# Patient Record
Sex: Female | Born: 1970 | Race: White | Hispanic: No | Marital: Single | Smoking: Former smoker
Health system: Southern US, Community
[De-identification: ages and names within clinical notes are randomized; demographics above are authoritative.]

## PROBLEM LIST (undated history)

## (undated) DIAGNOSIS — I314 Cardiac tamponade: Secondary | ICD-10-CM

## (undated) DIAGNOSIS — R51 Headache: Secondary | ICD-10-CM

## (undated) DIAGNOSIS — K8020 Calculus of gallbladder without cholecystitis without obstruction: Secondary | ICD-10-CM

## (undated) DIAGNOSIS — I3139 Other pericardial effusion (noninflammatory): Secondary | ICD-10-CM

## (undated) DIAGNOSIS — K760 Fatty (change of) liver, not elsewhere classified: Secondary | ICD-10-CM

## (undated) DIAGNOSIS — G43109 Migraine with aura, not intractable, without status migrainosus: Secondary | ICD-10-CM

## (undated) DIAGNOSIS — F411 Generalized anxiety disorder: Secondary | ICD-10-CM

## (undated) DIAGNOSIS — G8929 Other chronic pain: Secondary | ICD-10-CM

## (undated) DIAGNOSIS — R112 Nausea with vomiting, unspecified: Secondary | ICD-10-CM

## (undated) DIAGNOSIS — I4901 Ventricular fibrillation: Secondary | ICD-10-CM

## (undated) DIAGNOSIS — Z8619 Personal history of other infectious and parasitic diseases: Secondary | ICD-10-CM

## (undated) DIAGNOSIS — F329 Major depressive disorder, single episode, unspecified: Secondary | ICD-10-CM

## (undated) DIAGNOSIS — R0789 Other chest pain: Secondary | ICD-10-CM

## (undated) DIAGNOSIS — I5042 Chronic combined systolic (congestive) and diastolic (congestive) heart failure: Secondary | ICD-10-CM

## (undated) DIAGNOSIS — I4892 Unspecified atrial flutter: Secondary | ICD-10-CM

## (undated) DIAGNOSIS — R519 Headache, unspecified: Secondary | ICD-10-CM

## (undated) DIAGNOSIS — I959 Hypotension, unspecified: Secondary | ICD-10-CM

## (undated) DIAGNOSIS — D649 Anemia, unspecified: Secondary | ICD-10-CM

## (undated) DIAGNOSIS — Z9289 Personal history of other medical treatment: Secondary | ICD-10-CM

## (undated) DIAGNOSIS — G709 Myoneural disorder, unspecified: Secondary | ICD-10-CM

## (undated) DIAGNOSIS — I4891 Unspecified atrial fibrillation: Secondary | ICD-10-CM

## (undated) DIAGNOSIS — I422 Other hypertrophic cardiomyopathy: Secondary | ICD-10-CM

## (undated) DIAGNOSIS — Z9889 Other specified postprocedural states: Secondary | ICD-10-CM

## (undated) DIAGNOSIS — Z5189 Encounter for other specified aftercare: Secondary | ICD-10-CM

## (undated) DIAGNOSIS — R55 Syncope and collapse: Secondary | ICD-10-CM

## (undated) DIAGNOSIS — E039 Hypothyroidism, unspecified: Secondary | ICD-10-CM

## (undated) DIAGNOSIS — I82409 Acute embolism and thrombosis of unspecified deep veins of unspecified lower extremity: Secondary | ICD-10-CM

## (undated) DIAGNOSIS — M549 Dorsalgia, unspecified: Secondary | ICD-10-CM

## (undated) DIAGNOSIS — M797 Fibromyalgia: Secondary | ICD-10-CM

## (undated) DIAGNOSIS — I313 Pericardial effusion (noninflammatory): Secondary | ICD-10-CM

## (undated) DIAGNOSIS — I96 Gangrene, not elsewhere classified: Secondary | ICD-10-CM

## (undated) DIAGNOSIS — F4310 Post-traumatic stress disorder, unspecified: Secondary | ICD-10-CM

## (undated) DIAGNOSIS — F32A Depression, unspecified: Secondary | ICD-10-CM

## (undated) HISTORY — DX: Other specified postprocedural states: Z98.890

## (undated) HISTORY — DX: Other chronic pain: G89.29

## (undated) HISTORY — PX: INSERT / REPLACE / REMOVE PACEMAKER: SUR710

## (undated) HISTORY — DX: Personal history of other infectious and parasitic diseases: Z86.19

## (undated) HISTORY — PX: INDUCED ABORTION: SHX677

## (undated) HISTORY — DX: Dorsalgia, unspecified: M54.9

## (undated) HISTORY — DX: Migraine with aura, not intractable, without status migrainosus: G43.109

## (undated) HISTORY — DX: Headache: R51

## (undated) HISTORY — DX: Myoneural disorder, unspecified: G70.9

## (undated) HISTORY — DX: Anemia, unspecified: D64.9

## (undated) HISTORY — DX: Fatty (change of) liver, not elsewhere classified: K76.0

## (undated) HISTORY — DX: Generalized anxiety disorder: F41.1

## (undated) HISTORY — PX: MITRAL VALVE REPAIR: SHX2039

## (undated) HISTORY — PX: MYOMECTOMY: SHX85

## (undated) HISTORY — DX: Personal history of other medical treatment: Z92.89

## (undated) HISTORY — DX: Encounter for other specified aftercare: Z51.89

## (undated) HISTORY — DX: Headache, unspecified: R51.9

## (undated) HISTORY — DX: Other chest pain: R07.89

---

## 2006-07-08 ENCOUNTER — Emergency Department (HOSPITAL_COMMUNITY): Admission: EM | Admit: 2006-07-08 | Discharge: 2006-07-08 | Payer: Self-pay | Admitting: Family Medicine

## 2008-04-20 ENCOUNTER — Ambulatory Visit: Payer: Self-pay

## 2008-08-15 ENCOUNTER — Ambulatory Visit: Payer: Self-pay | Admitting: Cardiovascular Disease

## 2009-03-09 DIAGNOSIS — F418 Other specified anxiety disorders: Secondary | ICD-10-CM

## 2009-03-09 DIAGNOSIS — I429 Cardiomyopathy, unspecified: Secondary | ICD-10-CM | POA: Insufficient documentation

## 2010-07-17 ENCOUNTER — Emergency Department: Payer: Self-pay | Admitting: Emergency Medicine

## 2010-10-09 ENCOUNTER — Encounter: Payer: Self-pay | Admitting: Cardiovascular Disease

## 2010-10-15 ENCOUNTER — Ambulatory Visit: Admission: RE | Admit: 2010-10-15 | Discharge: 2010-10-15 | Payer: Self-pay | Source: Home / Self Care

## 2010-10-15 ENCOUNTER — Ambulatory Visit (HOSPITAL_COMMUNITY)
Admission: RE | Admit: 2010-10-15 | Discharge: 2010-10-15 | Payer: Self-pay | Source: Home / Self Care | Attending: Cardiovascular Disease | Admitting: Cardiovascular Disease

## 2010-10-15 ENCOUNTER — Emergency Department: Payer: Self-pay | Admitting: Unknown Physician Specialty

## 2010-10-15 ENCOUNTER — Encounter: Payer: Self-pay | Admitting: Cardiovascular Disease

## 2010-10-18 ENCOUNTER — Ambulatory Visit: Admit: 2010-10-18 | Payer: Self-pay | Admitting: Cardiovascular Disease

## 2010-10-22 ENCOUNTER — Telehealth (INDEPENDENT_AMBULATORY_CARE_PROVIDER_SITE_OTHER): Payer: Self-pay | Admitting: *Deleted

## 2010-11-01 NOTE — Progress Notes (Signed)
Summary: Faxerd records to Dr. Cyndie Mull at [facility].   Faxerd records to Dr. Cyndie Mull at [facility]. ECHO Fax: [phone] Phone:[phone] ext. [phone] Marylou Mccoy  October 22, 2010 10:35 AM

## 2010-11-01 NOTE — Letter (Signed)
Summary: Medical Record Release  Medical Record Release   Imported By: Harlon Flor 10/09/2010 13:50:19  _____________________________________________________________________  External Attachment:    Type:   Image     Comment:   External Document

## 2011-02-12 NOTE — Assessment & Plan Note (Signed)
[facility] OFFICE NOTE   [name], [name]                         MRN:          [id number]  DATE:08/15/2008                            DOB:          08/31/1971    REASON FOR VISIT:  Hypertrophic cardiomyopathy.   HISTORY OF PRESENT ILLNESS:  [name] is a 41 year old woman who moved  here from [place] approximately 1-1/2 years ago.  She has previously  seen a cardiologist in [place] and it appears that she carries a  diagnosis of hypertrophic cardiomyopathy.  I do not have any records,  but her description sounds consistent with that diagnosis.  She has been  told she has a thick heart muscle.  She is treated with beta-blocker  and calcium channel blocker, and is actually doing well from a  symptomatic standpoint.  She specifically denies chest pain, dyspnea,  lightheadedness, or syncope.  She was first diagnosed with a heart  murmur when she was 40 years old, but was not diagnosed with  hypertrophic cardiomyopathy until her first pregnancy.  She has not seen  a primary care physician or other doctors since she has lived in [place].  Her past evaluation has included an echocardiogram.  I do not  think she is having the other testing done.  She denies palpitations,  edema, orthopnea, PND, or other cardiovascular complaints.   PAST MEDICAL HISTORY:  1. C-section, 1991.  2. Anxiety.  3. Seasonal allergies.   She has no history of hospitalizations or other chronic medical  problems.   FAMILY HISTORY:  There is no history of hypertrophic cardiomyopathy or  sudden death in the family.  Her parents are alive.  Her mother is age  42 and has thyroid disease, her father is 74 years old and has diabetes  and some unspecified heart problem.  As above, there is no sudden death  in the family.   SOCIAL HISTORY:  The patient is single.  She has an 89 year old daughter  who is a Consulting civil engineer at [employer].  Her daughter has been told she  has a benign murmur, but does not carry the diagnosis of hypertrophic  cardiomyopathy.  The patient does not smoke cigarettes.  She is a former  smoker that quit in 1990.  She drinks 2-3 alcoholic beverages per week  at the most.  She walks 20-30 minutes daily.  She is not engaged in any  heavy physical exercise.   REVIEW OF SYSTEMS:  Pertinent positives include asthma, anemia, anxiety,  and exertional dyspnea with moderate level activity.  A complete 12-  point review of systems was performed and is otherwise negative.   MEDICATIONS:  1. Toprol-XL 100 mg b.i.d.  2. Cartia XT 120 mg daily.  3. Zyrtec one daily.  4. Advair 250/50 mcg daily.   ALLERGIES:  NKDA.   PHYSICAL EXAMINATION:  GENERAL:  The patient is alert and oriented.  She  is in no acute distress.  VITAL SIGNS:  Weight is 184 pounds, blood pressure is 80/60, heart rate  69, respiratory rate  12.  HEENT:  Normal.  NECK:  Normal carotid upstrokes with bilateral transmitted bruits.  JVP  is normal.  No thyromegaly or thyroid nodules.  LUNGS:  Clear bilaterally.  HEART:  The apex is discrete and nondisplaced.  Regular rate and rhythm.  There is a 3/6 midsystolic murmur best heard along the left sternal  border.  There is a widely split S2 with normal respiratory changes.  ABDOMEN:  Soft, obese, nontender.  No organomegaly.  EXTREMITIES:  No clubbing, cyanosis, or edema.  Peripheral pulses are  intact and equal.  SKIN:  Warm and dry without rash.  NEUROLOGIC:  Cranial nerves II through XII intact.  Strength is intact  and equal bilaterally.   EKG shows normal sinus rhythm with marked LVH and marked anterolateral  ST and T-wave changes consistent with a diagnosis of hypertrophic  cardiomyopathy.   There is also a left axis deviation with left anterior hemiblock.   ASSESSMENT:  This is a 40 year old woman with physical exam and  electrocardiographic signs of hypertrophic  cardiomyopathy.  She does not  appear to have high-risk features in an absence of family history and  symptoms.  Her exertional dyspnea may be related to her heart disease,  but also may be from deconditioning and asthma.  I think she clearly  should have an echocardiogram to make certain of the diagnosis and  assess the thickness of the interventricular septum.  We will follow up  with her after her echo is completed.  She was given refills for her  cardiac medications today.  She was advised to avoid strenuous activity.     Veverly Fells. Excell Seltzer, MD  Electronically Signed    MDC/MedQ  DD: 08/15/2008  DT: 08/16/2008  Job #: [id number]

## 2011-03-18 ENCOUNTER — Encounter: Payer: Self-pay | Admitting: Cardiovascular Disease

## 2011-07-18 ENCOUNTER — Emergency Department (HOSPITAL_COMMUNITY)
Admission: EM | Admit: 2011-07-18 | Discharge: 2011-07-18 | Disposition: A | Discharge status: Home / Self Care | Payer: 59 | Attending: Emergency Medicine | Admitting: Emergency Medicine

## 2011-07-18 DIAGNOSIS — F410 Panic disorder [episodic paroxysmal anxiety] without agoraphobia: Secondary | ICD-10-CM | POA: Insufficient documentation

## 2011-07-18 DIAGNOSIS — R011 Cardiac murmur, unspecified: Secondary | ICD-10-CM | POA: Insufficient documentation

## 2011-07-18 DIAGNOSIS — R52 Pain, unspecified: Secondary | ICD-10-CM | POA: Insufficient documentation

## 2011-07-18 DIAGNOSIS — Z79899 Other long term (current) drug therapy: Secondary | ICD-10-CM | POA: Insufficient documentation

## 2011-07-18 DIAGNOSIS — F411 Generalized anxiety disorder: Secondary | ICD-10-CM | POA: Insufficient documentation

## 2011-08-13 ENCOUNTER — Telehealth: Payer: Self-pay | Admitting: Cardiovascular Disease

## 2011-08-13 NOTE — Telephone Encounter (Signed)
I spoke with the pt and she has a history of hypertrophic Cardiomyopathy.  The pt has not seen Dr Excell Seltzer since 2009.  The pt did have an ECHO in January 2012 with a normal EF. I made the pt aware that Dr Excell Seltzer is out of the office this week.  I offered her an appt with the PA this week but she has to work.  She would like to see the PA next week (08/20/11) and I will leave the appt with Dr Excell Seltzer on 08/30/11.

## 2011-08-13 NOTE — Telephone Encounter (Signed)
Pt having SOB thought it was due to  upper respiratory infection but that's getting better and SOB still the same, PCP did xray today and it shoed enlarged heart, she has appt 11-30 andd wants a sooner appt with Dr Excell Seltzer

## 2011-08-14 ENCOUNTER — Inpatient Hospital Stay (HOSPITAL_COMMUNITY)
Admission: EM | Admit: 2011-08-14 | Discharge: 2011-08-15 | DRG: 316 | Disposition: A | Discharge status: Home / Self Care | Payer: 59 | Source: Ambulatory Visit | Attending: Internal Medicine | Admitting: Internal Medicine

## 2011-08-14 ENCOUNTER — Encounter (HOSPITAL_COMMUNITY): Payer: Self-pay | Admitting: *Deleted

## 2011-08-14 ENCOUNTER — Emergency Department (HOSPITAL_COMMUNITY): Payer: 59

## 2011-08-14 DIAGNOSIS — R197 Diarrhea, unspecified: Secondary | ICD-10-CM

## 2011-08-14 DIAGNOSIS — R112 Nausea with vomiting, unspecified: Secondary | ICD-10-CM

## 2011-08-14 DIAGNOSIS — G8929 Other chronic pain: Secondary | ICD-10-CM | POA: Diagnosis present

## 2011-08-14 DIAGNOSIS — I429 Cardiomyopathy, unspecified: Secondary | ICD-10-CM | POA: Diagnosis present

## 2011-08-14 DIAGNOSIS — K5289 Other specified noninfective gastroenteritis and colitis: Secondary | ICD-10-CM | POA: Diagnosis present

## 2011-08-14 DIAGNOSIS — F411 Generalized anxiety disorder: Secondary | ICD-10-CM | POA: Diagnosis present

## 2011-08-14 DIAGNOSIS — J45909 Unspecified asthma, uncomplicated: Secondary | ICD-10-CM | POA: Diagnosis present

## 2011-08-14 DIAGNOSIS — M549 Dorsalgia, unspecified: Secondary | ICD-10-CM | POA: Diagnosis present

## 2011-08-14 DIAGNOSIS — Z87891 Personal history of nicotine dependence: Secondary | ICD-10-CM

## 2011-08-14 DIAGNOSIS — R0602 Shortness of breath: Secondary | ICD-10-CM

## 2011-08-14 DIAGNOSIS — E860 Dehydration: Secondary | ICD-10-CM

## 2011-08-14 DIAGNOSIS — F418 Other specified anxiety disorders: Secondary | ICD-10-CM | POA: Diagnosis present

## 2011-08-14 DIAGNOSIS — I319 Disease of pericardium, unspecified: Secondary | ICD-10-CM

## 2011-08-14 DIAGNOSIS — I421 Obstructive hypertrophic cardiomyopathy: Principal | ICD-10-CM | POA: Diagnosis present

## 2011-08-14 LAB — COMPREHENSIVE METABOLIC PANEL
Albumin: 3.8 g/dL (ref 3.5–5.2)
Alkaline Phosphatase: 71 U/L (ref 39–117)
BUN: 9 mg/dL (ref 6–23)
Chloride: 103 mEq/L (ref 96–112)
Potassium: 3.8 mEq/L (ref 3.5–5.1)
Total Bilirubin: 1.1 mg/dL (ref 0.3–1.2)

## 2011-08-14 LAB — CBC
MCH: 28.9 pg (ref 26.0–34.0)
MCV: 87.4 fL (ref 78.0–100.0)
WBC: 6.6 10*3/uL (ref 4.0–10.5)

## 2011-08-14 LAB — TSH: TSH: 3.15 u[IU]/mL (ref 0.350–4.500)

## 2011-08-14 MED ORDER — ENOXAPARIN SODIUM 40 MG/0.4ML ~~LOC~~ SOLN
40.0000 mg | SUBCUTANEOUS | Status: DC
  Administered 2011-08-14: 40 mg via SUBCUTANEOUS
  Filled 2011-08-14 (×2): qty 0.4

## 2011-08-14 MED ORDER — ACETAMINOPHEN 325 MG PO TABS: 650.0000 mg | ORAL_TABLET | Freq: Four times a day (QID) | ORAL | Status: DC | PRN

## 2011-08-14 MED ORDER — DILTIAZEM HCL ER COATED BEADS 120 MG PO CP24
120.0000 mg | ORAL_CAPSULE | Freq: Every day | ORAL | Status: DC
  Filled 2011-08-14: qty 1

## 2011-08-14 MED ORDER — ACETAMINOPHEN 650 MG RE SUPP: 650.0000 mg | Freq: Four times a day (QID) | RECTAL | Status: DC | PRN

## 2011-08-14 MED ORDER — ONDANSETRON HCL 4 MG PO TABS: 4.0000 mg | ORAL_TABLET | Freq: Four times a day (QID) | ORAL | Status: DC | PRN

## 2011-08-14 MED ORDER — METOPROLOL SUCCINATE ER 100 MG PO TB24
100.0000 mg | ORAL_TABLET | Freq: Every day | ORAL | Status: DC
  Filled 2011-08-14: qty 1

## 2011-08-14 MED ORDER — FLUTICASONE PROPIONATE HFA 44 MCG/ACT IN AERO
1.0000 | INHALATION_SPRAY | Freq: Two times a day (BID) | RESPIRATORY_TRACT | Status: DC
  Administered 2011-08-14 – 2011-08-15 (×2): 1 via RESPIRATORY_TRACT
  Filled 2011-08-14: qty 10.6

## 2011-08-14 MED ORDER — ONDANSETRON HCL 4 MG/2ML IJ SOLN
4.0000 mg | Freq: Once | INTRAMUSCULAR | Status: AC
  Administered 2011-08-14: 4 mg via INTRAVENOUS

## 2011-08-14 MED ORDER — SODIUM CHLORIDE 0.9 % IV SOLN
Freq: Once | INTRAVENOUS | Status: AC
  Administered 2011-08-14: 12:00:00 via INTRAVENOUS

## 2011-08-14 MED ORDER — ONDANSETRON HCL 4 MG/2ML IJ SOLN
INTRAMUSCULAR | Status: AC
  Filled 2011-08-14: qty 2

## 2011-08-14 MED ORDER — SODIUM CHLORIDE 0.9 % IV SOLN
INTRAVENOUS | Status: DC
  Administered 2011-08-14: 12:00:00 via INTRAVENOUS

## 2011-08-14 MED ORDER — SODIUM CHLORIDE 0.9 % IV BOLUS (SEPSIS)
500.0000 mL | Freq: Once | INTRAVENOUS | Status: AC
  Administered 2011-08-14: 500 mL via INTRAVENOUS

## 2011-08-14 MED ORDER — NORETHINDRONE 0.35 MG PO TABS
1.0000 | ORAL_TABLET | Freq: Every day | ORAL | Status: DC
  Administered 2011-08-15: 0.35 mg via ORAL

## 2011-08-14 MED ORDER — LORATADINE 10 MG PO TABS
10.0000 mg | ORAL_TABLET | Freq: Every day | ORAL | Status: DC
  Administered 2011-08-15: 10 mg via ORAL
  Filled 2011-08-14: qty 1

## 2011-08-14 MED ORDER — SODIUM CHLORIDE 0.9 % IV SOLN
INTRAVENOUS | Status: AC
  Administered 2011-08-14: 23:00:00 via INTRAVENOUS

## 2011-08-14 MED ORDER — AMITRIPTYLINE HCL 25 MG PO TABS
25.0000 mg | ORAL_TABLET | Freq: Every day | ORAL | Status: DC
  Filled 2011-08-14 (×2): qty 1

## 2011-08-14 MED ORDER — ONDANSETRON HCL 4 MG/2ML IJ SOLN: 4.0000 mg | Freq: Four times a day (QID) | INTRAMUSCULAR | Status: DC | PRN

## 2011-08-14 NOTE — H&P (Signed)
Hospital Admission Note Date: 08/14/2011  Patient name: [name] Medical record number: [id number] Date of birth: 03/03/1971 Age: 40 y.o. Gender: female PCP: No primary provider on file.  Medical Service: Int Medicine  Attending physician:  Dr. Meredith Pel   Pager: Resident (R2/R3):  Dr. Tonny Branch   Pager: [phone] Resident (R1):  Dr. Berlinda Last   Pager: [phone]  Chief Complaint: Nausea/Vomiting/Diarrhea  History of Present Illness: Patient is a 40 year old woman with a history of obstructive HCM, asthma, and anxiety who presents with an acute history of nausea, vomiting, and diarrhea. Patient says that she had a sinus infection 10 days ago and was seen by urgent care, and was prescribed Augmentin. A few days later, the patient started developing watery diarrhea roughly 2-4 times daily. Then, roughly 2 days ago, the patient began vomiting as many as 5 times per day. The patient says that her boyfriend had a similar diarrheal illness a few days ago, but his resolved without intervention. She denies any hematemesis, melena, or bright red blood per rectum. She also denies any abdominal pain, or chest pain. No fevers or chills. She admits to a dry cough that has persisted throughout the duration of this illness, it is not getting better.  In addition, the patient complains of 6 months of progressive worsening dyspnea on exertion. She does carry the diagnosis of hypertrophic cardiomyopathy, and was last seen by Dr. Excell Seltzer in 2009. She had an echo in January of 2012 that showed an ejection fraction of 60-65%, very small outflow tract, and severe LVH. Her Doppler parameters were consistent with restrictive physiology. The patient describes a decrease in her exercise tolerance, down from a quarter mile to less than a block. She also endorses orthopnea, now requiring 5 pillows, up from 2. She denies PND, but will wake up in the night coughing. She also admits to occasional dizziness, but denies any palpitations,  chest pain, or weight change.  Past Medical History: Past Medical History  Diagnosis Date  . Secondary cardiomyopathy, unspecified   . Anxiety state, unspecified   . Seasonal allergies   . Asthma    Past Surgical History  Procedure Date  . Cesarean section 1991  . Induced abortion 1990 and 1993    Meds: Current Facility-Administered Medications  Medication Dose Route Frequency Provider Last Rate Last Dose  . 0.9 %  sodium chloride infusion   Intravenous Once Shaaron Adler, [place] 100 mL/hr at 08/14/11 1210    . 0.9 %  sodium chloride infusion   Intravenous Continuous Jessica Hope 100 mL/hr at 08/14/11 1200    . ondansetron (ZOFRAN) 4 MG/2ML injection           . ondansetron (ZOFRAN) injection 4 mg  4 mg Intravenous Once Shaaron Adler, PA   4 mg at 08/14/11 0730  . sodium chloride 0.9 % bolus 500 mL  500 mL Intravenous Once Shaaron Adler, PA   500 mL at 08/14/11 0715   Current Outpatient Prescriptions  Medication Sig Dispense Refill  . beclomethasone (QVAR) 80 MCG/ACT inhaler Inhale 1 puff into the lungs as needed. For breathing      . cetirizine (ZYRTEC) 10 MG tablet Take 10 mg by mouth daily.        . metoprolol (TOPROL-XL) 100 MG 24 hr tablet Take 100 mg by mouth daily.        . norethindrone (MICRONOR,CAMILA,ERRIN) 0.35 MG tablet Take 1 tablet by mouth daily.        Marland Kitchen diltiazem (CARDIZEM CD)  120 MG 24 hr capsule Take 120 mg by mouth daily.          Allergies: Review of patient's allergies indicates no known allergies.  Family Hx: No history of HOCM  Social Hx: History   Social History  . Marital Status: Single    Spouse Name: N/A    Number of Children: 1  . Years of Education: N/A   Occupational History  . Naval architect    Social History Main Topics  . Smoking status: Former Smoker -- 1.0 packs/day for 3 years    Types: Cigarettes    Quit date: 09/30/1988  . Smokeless tobacco: Not on file   Comment: quitin 1990  . Alcohol  Use: 0.0 oz/week     rarely drinks wine  . Drug Use: No     quiti n 1989  . Sexually Active: Not on file   Other Topics Concern  . 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  PCP Dr. Sharen Hones.  Cardiologist Dr. Excell Seltzer.  Last saw him in 2009.    Review of Systems: --Patient also complains of right lateral leg numbness with prolonged sitting or standing  Physical Exam: Filed Vitals:   08/14/11 1018 08/14/11 1130 08/14/11 1228 08/14/11 1335  BP: 82/51 77/50 85/50  83/55  Pulse: 68 62 74 74  Temp:      TempSrc:      Resp:    16  SpO2: 99% 99% 97% 98%   GEN: No apparent distress.  Alert and oriented x 3.  Pleasant, conversant, and cooperative to exam. HEENT: head is autraumatic and normocephalic.  Neck is supple without palpable masses or lymphadenopathy. EOMI.  PERRLA.  Sclerae anicteric.  Conjunctivae without pallor or injection. Mucous membranes are moist.  Oropharynx is without erythema, exudates, or other abnormal lesions. RESP:  Lungs are clear to ascultation bilaterally with good air movement.  No wheezes, ronchi, or rubs. CARDIOVASCULAR: regular rate, normal rhythm.  Clear S1, S2,3/6 HSM @ LSB.  Worse c valsalva ABDOMEN: soft, non-tender, non-distended.  Bowels sounds present in all quadrants and normoactive.  No palpable masses. EXT: warm and dry.  Peripheral pulses equal, intact, and +2 globally.  No clubbing or cyanosis.  No edema in b/l lower extremeties SKIN: warm and dry with normal turgor.  No rashes or abnormal lesions observed. NEURO: CN II-XII grossly intact.  Muscle strength +5/5 in bilateral upper and lower extremities.  Sensation is grossly intact.  No focal deficit.  Lab results: Basic Metabolic Panel:  Basename 08/14/11 0730  NA 136  K 3.8  CL 103  CO2 25  GLUCOSE 88  BUN 9  CREATININE 0.59  CALCIUM 9.2  MG --  PHOS --   Liver Function Tests:  Basename 08/14/11 0730  AST 36  ALT 40*  ALKPHOS 71    BILITOT 1.1  PROT 8.0  ALBUMIN 3.8   CBC:  Basename 08/14/11 0730  WBC 6.6  NEUTROABS --  HGB 13.6  HCT 41.1  MCV 87.4  PLT 211   BNP:  Basename 08/14/11 0730  POCBNP 2451.0*    Imaging results:  Dg Chest 2 View  08/14/2011  *RADIOLOGY REPORT*  Clinical Data: Cough, shortness of breath  CHEST - 2 VIEW  Comparison: None.  Findings: Cardiomegaly is noted.  Central mild vascular congestion without convincing pulmonary edema.  No segmental infiltrate.  Mild elevation of the right hemidiaphragm.  Mild degenerative changes thoracic spine.  IMPRESSION: Cardiomegaly.  No segmental infiltrate or convincing pulmonary edema.  Mild elevation of the right hemidiaphragm.  Original Report Authenticated By: Natasha Mead, M.D.    Other results: EKG:NSR @ 67 with e/o LVH, c/w prior  Assessment & Plan by Problem: #Nausea, vomiting, diarrhea Patient's presentation consistent with an acute gastroenteritis. Time course of diarrhea followed later by vomiting may not be classic, but the acute nature of the events as well as her known sick contact are suspicious for something infectious in origin. Most likely viral, but in the setting of recent Augmentin use, could also be bacterial. In addition, the Augmentin itself can cause diarrhea that is noninfectious in nature. We will test for C. difficile and give her symptomatic treatment with antiemetics and fluid resuscitation. In the setting of her known HOCM, we will have to be gentle with the fluids. The patient's baseline blood pressure is in the 90s (likely secondary to her small amount of outflow tract obstruction), and she came in in the 70s. There was some concern regarding her hypotension, but the patient appears quite well and not acutely ill. Constantly, we'll admit to a telemetry bed and continue with gentle fluids. - c diff pcr - gentle fluids - zofran - telemetry bed - TSH  #HOCM Patient presents a picture of progressive worsening of heart  failure type symptoms over a six-month period of time. She last saw Dr. Excell Seltzer in 2009, and had an echo in January of 2012 but did not followup with any provider. Her course is certainly concerning for worsening obstruction, and an echo has been ordered. At this time she does not appear to be in acute failure, with clear lungs, no peripheral edema, and no pulmonary edema on chest x-ray. Her BNP is elevated, but that may be chronic for her (at least over the past few months), or it could be in relation to her decreased preload secondary to volume depletion causing strain on her heart.  Cardiology saw the patient in the emergency room and agreed to consult. It is likely that her acute GI upset will resolve with minimal intervention, but this HOCM will be an ongoing issue for her. - Continue home metoprolol 100mg  XL and diltiazem 120mg  daily - Followup echo - Appreciate cardiology input - Daily ins and outs - Blood cultures to rule out SBE   #Shortness of breath Patient does have cough and worsening shortness of breath over the past few months. This is likely a combination of her HOCM, her asthma, and her anxiety. We will be treating all of these issues and currently, and working up the current status of her HOCM. - see above for HOCM - loratadine - amitriptyline at bedtime - fluticasone - albuterol PRN  #Prophylaxis - lovenox

## 2011-08-14 NOTE — ED Notes (Signed)
MD at bedside, Dr. Ignacia Palma

## 2011-08-14 NOTE — ED Provider Notes (Signed)
History     CSN: [id number] Arrival date & time: 08/14/2011  5:51 AM   First MD Initiated Contact with Patient 08/14/11 [phone]      Chief Complaint  Patient presents with  . Emesis    (Consider location/radiation/quality/duration/timing/severity/associated sxs/prior treatment) Patient is a 40 y.o. female presenting with vomiting. The history is provided by the patient. No language interpreter was used.  Emesis  Associated symptoms include cough and diarrhea. Pertinent negatives include no abdominal pain, no chills, no fever and no headaches.  Pt presents with 2 days of nausea, vomiting, and diarrhea. Reports unable to keep down any solids or liquids. 5-10 episodes daily. Unchanged since onset. Vomit with undigested food/bile- pt denies hematemesis. Diarrhea alternating between soft and watery, no hematochezia. Pt reports associated lightheadedness when standing. Denies fever. She has been on Augmentin since last Sunday when she went to a [facility] for evaluation of cough and was dx with a sinus infection.  Pt also c/o a non-productive cough for approx 10 days. She was seen at an urgent care at time of onset and was given abx for a sinus infection. Has had increased SOB associated with the cough. Admits to mild leg swelling after working on her feet for long shifts but states it resolves by morning. Reports orthopnea that requires sleeping in a sitting position most nights. Worse with exertion. Denies fever, CP, diaphoresis. Has tried albuterol for the cough without relief. Presented to her PMD yesterday with continued c/o cough; a CXR was performed and she was told to follow up with Dr Excell Seltzer for an enlarged heart but that there was no pneumonia on her CXR. Pt reports a hx of murmur but denies any knowledge of an enlarged heart in the past. Pt does report a hx of asthma.  Past Medical History  Diagnosis Date  . Secondary cardiomyopathy, unspecified   . Anxiety state, unspecified   .  Seasonal allergies   . Asthma   . Enlarged heart     Past Surgical History  Procedure Date  . Cesarean section 1991  . Induced abortion 1990 and 1993    History reviewed. No pertinent family history.  History  Substance Use Topics  . Smoking status: Former Games developer  . Smokeless tobacco: Not on file   Comment: quitin 1990  . Alcohol Use: Yes     Review of Systems  Constitutional: Negative for fever, chills and diaphoresis.  HENT: Positive for congestion, postnasal drip and sinus pressure. Negative for ear pain, sore throat, neck pain and neck stiffness.   Eyes: Negative for pain and visual disturbance.  Respiratory: Positive for cough and shortness of breath. Negative for chest tightness and wheezing.   Cardiovascular: Negative for chest pain, palpitations and leg swelling.  Gastrointestinal: Positive for nausea, vomiting and diarrhea. Negative for abdominal pain.  Musculoskeletal: Positive for back pain. Negative for joint swelling and gait problem.  Neurological: Positive for light-headedness. Negative for dizziness, seizures, weakness, numbness and headaches.  Hematological: Does not bruise/bleed easily.  Psychiatric/Behavioral: Negative for behavioral problems and confusion.    Allergies  Review of patient's allergies indicates no known allergies.  Home Medications   Current Outpatient Rx  Name Route Sig Dispense Refill  . BECLOMETHASONE DIPROPIONATE 80 MCG/ACT IN AERS Inhalation Inhale 1 puff into the lungs as needed. For breathing    . CETIRIZINE HCL 10 MG PO TABS Oral Take 10 mg by mouth daily.      Marland Kitchen METOPROLOL SUCCINATE 100 MG PO TB24 Oral  Take 100 mg by mouth daily.      . NORETHINDRONE 0.35 MG PO TABS Oral Take 1 tablet by mouth daily.      Marland Kitchen DILTIAZEM HCL COATED BEADS 120 MG PO CP24 Oral Take 120 mg by mouth daily.        BP 76/50  Pulse 66  Temp(Src) 98.5 F (36.9 C) (Oral)  Resp 16  SpO2 98%  LMP 07/22/2011  Physical Exam  Constitutional: She is  oriented to person, place, and time. She appears well-developed and well-nourished.       Uncomfortable appearing  HENT:  Head: Normocephalic and atraumatic.  Mouth/Throat: Oropharynx is clear and moist.  Eyes: Pupils are equal, round, and reactive to light.  Neck: Normal range of motion. Neck supple.  Cardiovascular: Normal rate, regular rhythm and normal pulses.   Murmur heard.      Harsh systolic murmur best heard at the left sternal border  Pulmonary/Chest: Effort normal. She exhibits no tenderness.       rales heard in the left lung base  Abdominal: Soft. Bowel sounds are normal. She exhibits no distension. There is no tenderness.  Musculoskeletal: Normal range of motion. She exhibits no edema and no tenderness.  Neurological: She is alert and oriented to person, place, and time. No cranial nerve deficit.  Skin: Skin is warm and dry. No rash noted.  Psychiatric: She has a normal mood and affect. Her behavior is normal.    ED Course  Procedures (including critical care time)  Labs Reviewed  PRO B NATRIURETIC PEPTIDE - Abnormal; Notable for the following:    BNP, POC 2451.0 (*)    All other components within normal limits  COMPREHENSIVE METABOLIC PANEL - Abnormal; Notable for the following:    ALT 40 (*)    All other components within normal limits  CBC   Dg Chest 2 View  08/14/2011  *RADIOLOGY REPORT*  Clinical Data: Cough, shortness of breath  CHEST - 2 VIEW  Comparison: None.  Findings: Cardiomegaly is noted.  Central mild vascular congestion without convincing pulmonary edema.  No segmental infiltrate.  Mild elevation of the right hemidiaphragm.  Mild degenerative changes thoracic spine.  IMPRESSION: Cardiomegaly.  No segmental infiltrate or convincing pulmonary edema.  Mild elevation of the right hemidiaphragm.  Original Report Authenticated By: Natasha Mead, M.D.     1. Hypertrophic obstructive cardiomyopathy   2. Dehydration       MDM  Patient with history of  hypertrophic obstructive cardiomyopathy presents with cough x10 days and worsening shortness of breath. Labs and x-rays are read by myself. Her prior records are reviewed including an echocardiogram earlier this year. Her chest x-ray shows cardiomyopathy and her BNP is significantly elevated. In addition she appears to have a gastroenteritis. Given the complicated nature of her care with a combination of dehydration and apparent heart failure we'll plan to admit her to the hospital under [facility] cardiology   Medical screening examination/treatment/procedure(s) were conducted as a shared visit with non-physician practitioner(s) and myself.  I personally evaluated the patient during the encounter Osvaldo Human, M.D.      Elwyn Reach [place], [place] 08/14/11 1010   I have spoken with Dr Eden Emms, who feels a medicine admission with cardiology consult is more appropriate than cardiology admission. Will page medicine.  12:35pm: I spoken with the outpatient clinic resident who has agreed to see the patient may return to department and have her admitted under the care of the attending Dr. Dorene Sorrow joins.  [address] [place], [place] 08/14/11 1236  Carleene Cooper III, MD 08/14/11 [phone]

## 2011-08-14 NOTE — ED Notes (Signed)
Floor nurse concerned with admission d/t hypotension. Admitting physicians were aware and felt pt appropriate for cardiac floor.

## 2011-08-14 NOTE — ED Notes (Signed)
Report received, pt resting on stretcher with husband at bedside.  Pt is in no acute distress, reports nausea, vomiting and diarrhea x 4 days with generalized body aches and pains.  Pt denies any abdominal pain.

## 2011-08-14 NOTE — Consult Note (Signed)
Patient ID: [name] MRN: [id number], DOB/AGE: 40-May-1972   Admit date: 08/14/2011 Date of Consult: 08/14/2011 10:18 AM  Primary Physician: Dr. Candelaria Stagers in [place], but switching to Dr. Eustaquio Boyden Primary Cardiologist: Tonny Bollman M.D.  Pt. Profile: 40yo caucasian female w/ h/o hypertrophic cardiomyopathy, asthma, and anxiety who presented to the ED with multiple complaints including n/v/d and cough w/ worsening SOB.  Problem List: Patient Active Hospital Problem List: CARDIOMYOPATHY, SECONDARY (03/09/2009)  Past Medical History  Diagnosis Date  . Secondary cardiomyopathy, unspecified   . Anxiety state, unspecified   . Seasonal allergies   . Asthma   . Enlarged heart     Past Surgical History  Procedure Date  . Cesarean section 1991  . Induced abortion 1990 and 1993     Allergies: No Known Allergies  HPI: 40yo caucasian female w/ h/o hypertrophic cardiomyopathy, asthma, and anxiety who presented to the ED with multiple complaints including n/v/d and cough w/ worsening SOB. Approximately 10 days ago she developed a nonproductive cough associated with worsening SOB, presented to urgent care and was prescribed Augmentin for a sinus infection. Despite increased Albuterol use her SOB continued. She also developed nausea, vomiting and diarrhea ~ 2days ago and presented to her PCP yesterday where a CXR revealed cardiomegaly, but otherwise clear. She denies fever, chills, sweats, hematemesis, hematochezia, or chest pain. (+) sick contacts with similar GI symptoms  She was diagnosed with hypertrophic cardiomyopathy by ECHO in ~1993 after her childhood murmur worsened. She was placed on a CCB and BB at that time which she remains on today. She established cardiac care with Dr. Excell Seltzer in 2009. She had another ECHO in January of this year reportedly for clearance prior to a D&C s/p miscarriage which showed severe concentric LVH w/ a very small LVOT and mild SAM and an LVEF  60-65%. She reports doing well until about 6 mos ago when she noticed increased DOE and orthopnea (requires 5 pillows) that has gradually progressed. She also notes mild ankle swelling at the end of a long day of standing that resolves by morning, mild abdominal distension, and occasional dizziness. She denies CP, pre/syncope, PND, palpitations, diaphoresis or weight change.  Outpatient Medications:  METOPROLOL SUCCINATE 100 MG PO TB24 Take 100 mg by mouth daily.   DILTIAZEM HCL COATED BEADS 120 MG PO CP24 Take 120 mg by mouth daily.  BECLOMETHASONE DIPROPIONATE 80 MCG/ACT IN AERS Inhale 1 puff daily CETIRIZINE HCL 10 MG PO TABS Take 10 mg by mouth daily.   NORETHINDRONE 0.35 MG PO TABS Take 1 tablet by mouth daily.    INPATIENT MEDICATIONS  Medication Dose Route Frequency Provider Last Rate Last Dose  . 0.9 %  sodium chloride infusion   Intravenous Once Shaaron Adler, [place] 15 mL/hr at 08/14/11 1015    . ondansetron (ZOFRAN) 4 MG/2ML injection           . ondansetron (ZOFRAN) injection 4 mg  4 mg Intravenous Once Shaaron Adler, PA   4 mg at 08/14/11 0730  . sodium chloride 0.9 % bolus 500 mL  500 mL Intravenous Once Shaaron Adler, PA   500 mL at 08/14/11 0715    History reviewed. There is no history of hypertrophic cardiomyopathy or sudden death in the family.  Her parents are alive.  Her mother is age 70 and has thyroid disease, her father is 50 years old and has diabetes   and some unspecified heart problem. She has an 63 year old daughter who is  a Consulting civil engineer at [employer].  Her daughter has been told she  has a benign murmur, but does not carry the diagnosis of hypertrophic   cardiomyopathy  History   Social History  . Marital Status: Single    Spouse Name: N/A    Number of Children: N/A  . Years of Education: N/A   Occupational History  . Not on file.   Social History Main Topics  . Smoking status: Former Games developer  . Smokeless tobacco: Not on file     Comment: quitin 1990, 3 pack/yrs  . Alcohol Use: rare, wine Yes  . Drug Use: denies No     quiti n 1989  . Sexually Active: Not on file   Other Topics Concern  . Not on file   Social History Narrative   Full time, single, does get regular exercise.      Review of Systems: General: negative for chills, fever, night sweats or weight changes.  Cardiovascular: As per HPI: otherwise negative for chest pain, palpitations, paroxysmal nocturnal dyspnea  Dermatological: negative for rash Respiratory: As per HPI Urologic: negative for hematuria Abdominal:As per HPI; otherwise negative for bright red blood per rectum, melena, or hematemesis Neurologic: dizziness; negative for visual changes, syncope, All other systems reviewed and are otherwise negative except as noted above.  Physical Exam: Blood pressure 81/59, pulse 71, temperature 98.5 F (36.9 C), temperature source Oral, resp. rate 16, last menstrual period 07/22/2011, SpO2 100.00%.   General: Pleasant, overweight, white female. Well developed, well nourished, in no acute distress. Neck: Supple. Negative for carotid bruits. No JVD. Lungs: Clear bilaterally to auscultation without wheezes, rales, or rhonchi. Breathing is unlabored. Heart: Harsh 3/6 systolic murmur best heard at left sternal border. Worsens with valsalva. RRR with S1 S2. No rubs or gallops appreciated. Abdomen: Soft, non-tender, non-distended with normoactive bowel sounds. No rebound/guarding. No obvious abdominal masses. Msk:  Strength and tone appears normal for age. Extremities: No clubbing, cyanosis or edema.  Distal pedal pulses are 2+ and equal bilaterally. Neuro: Alert and oriented X 3. Moves all extremities spontaneously. Psych:  Responds to questions appropriately with a normal affect.  Labs:   BNP: 2451  Lab Results  Component Value Date   WBC 6.6 08/14/2011   HGB 13.6 08/14/2011   HCT 41.1 08/14/2011   MCV 87.4 08/14/2011   PLT 211 08/14/2011      Lab 08/14/11 0730  NA 136  K 3.8  CL 103  CO2 25  BUN 9  CREATININE 0.59  CALCIUM 9.2  PROT 8.0  BILITOT 1.1  ALKPHOS 71  ALT 40*  AST 36  GLUCOSE 88    Radiology/Studies:  Dg Chest 2 View 08/14/2011   Findings: Cardiomegaly is noted.  Central mild vascular congestion without convincing pulmonary edema.  No segmental infiltrate.  Mild elevation of the right hemidiaphragm.  Mild degenerative changes thoracic spine.  IMPRESSION: Cardiomegaly.  No segmental infiltrate or convincing pulmonary edema.  Mild elevation of the right hemidiaphragm.     EKG: Sinus Rhythm 67bpm, w/ LVH and Left axis deviation  ASSESSMENT AND PLAN:  1. Shortness of Breath: Her current illness is most likely related to a viral or diarrheal illness as opposed to being caused by her hypertrophic cardiomyopathy. Although her BNP is elevated, her decrease in fluid intake in combination with vomiting/diarrhea has likely resulted in dehydration and a reduction in her preload which is a strain on her heart. She will be given IV hydration with close monitoring. We will obtain  an echocardiogram as she has become more symptomatic since her echo earlier this year. Blood cultures will be obtained to R/O SBE.  Continuation of her home BB and CCB are recommended.  Signed, HOPE, JESSICA, PA-C 08/14/2011, 10:18 AM  Patient examined and chart reviewed at time of encounter.  Primary issue is diarhea and dehydration.  Cautiously hydrate as low filling pressures will exacerbate her HOCM.    Charlton Haws MD [facility] 08/14/11 10:30 am

## 2011-08-14 NOTE — ED Notes (Signed)
Pt returned from echo. No distress noted. Pt helped back to bed by husband. Pt denies pain. Pt denies headache or dizziness.

## 2011-08-14 NOTE — ED Notes (Signed)
Pt transported for 2D Echo via wheelchair.

## 2011-08-14 NOTE — ED Notes (Signed)
Pt returned.

## 2011-08-14 NOTE — Progress Notes (Signed)
  Echocardiogram 2D Echocardiogram has been performed.  [name] 08/14/2011, 3:18 PM

## 2011-08-14 NOTE — ED Notes (Signed)
Pt reports relief from nausea.  Pt is alert, oriented and in no distress.  Strong radial pulse noted.  Bolus restarted due to blood pressure.

## 2011-08-14 NOTE — ED Notes (Signed)
Patient states she has been on antiobiocs for 10 days, c/o cough and was rechecked at MD office yest, was told after chest xray that she had an enlarged heart and was to follow up Dr. Excell Seltzer, states she started vomiting yest and isn't able to keep any food or liquids down, c/o constant cough.

## 2011-08-14 NOTE — ED Provider Notes (Signed)
Attending Note: Pt is a 40 year old woman with known hypertrophic cardiomyopathy, who has had increased shortness of breath over the past couple of days.  Physical examination shows a harsh systolic ejection murmur at the left sternal border, and rales at the left base.  BNP is elevated at 2451. Will need admission to [facility] for further evaluation and treatment.     Carleene Cooper III, MD 08/14/11 [phone]

## 2011-08-14 NOTE — ED Notes (Signed)
Pt c/o emesis times two days.  Denies fever chills.  States sore throat ,diarrhea times four days.  Denies blood in urine or stool.  Hurts all over rated 8/10.  Taking OTC without effect.  Was see at urgent care  One week ago and given ABX for sinus infection.  Was seen at Dr Gavin Potters clinic yesterday with CXR taken  WNL x cardiomegaly

## 2011-08-14 NOTE — ED Notes (Signed)
MD at bedside, Admission MD

## 2011-08-15 ENCOUNTER — Other Ambulatory Visit: Payer: Self-pay

## 2011-08-15 ENCOUNTER — Encounter: Payer: Self-pay | Admitting: Family Medicine

## 2011-08-15 DIAGNOSIS — I421 Obstructive hypertrophic cardiomyopathy: Secondary | ICD-10-CM

## 2011-08-15 LAB — BASIC METABOLIC PANEL
CO2: 25 mEq/L (ref 19–32)
Chloride: 104 mEq/L (ref 96–112)
GFR calc Af Amer: >90 mL/min (ref >90)
Potassium: 3.9 mEq/L (ref 3.5–5.1)

## 2011-08-15 MED ORDER — AMITRIPTYLINE HCL 25 MG PO TABS: 25.0000 mg | ORAL_TABLET | Freq: Every day | ORAL | Status: DC

## 2011-08-15 MED ORDER — DILTIAZEM HCL ER COATED BEADS 120 MG PO CP24
120.0000 mg | ORAL_CAPSULE | Freq: Every day | ORAL | Status: DC
  Administered 2011-08-15: 120 mg via ORAL

## 2011-08-15 MED ORDER — OXYCODONE-ACETAMINOPHEN 10-325 MG PO TABS: 1.0000 | ORAL_TABLET | Freq: Four times a day (QID) | ORAL | Status: AC | PRN

## 2011-08-15 MED ORDER — METOPROLOL SUCCINATE ER 100 MG PO TB24
100.0000 mg | ORAL_TABLET | Freq: Every day | ORAL | Status: DC
  Administered 2011-08-15: 100 mg via ORAL

## 2011-08-15 MED ORDER — GUAIFENESIN 100 MG/5ML PO SOLN
5.0000 mL | ORAL | Status: DC | PRN
  Filled 2011-08-15: qty 5

## 2011-08-15 NOTE — H&P (Signed)
Internal Medicine Attending Admission Note Date: 08/15/2011  Patient name: [name] Medical record number: [id number] Date of birth: [birth year] Age: 40 y.o. Gender: female  I saw and evaluated the patient. I reviewed the resident's note by Dr. Abner Greenspan and I agree with the resident's findings and plan as documented in the resident's note, with additional comments as noted below.  Chief Complaint(s): Nausea, vomiting, diarrhea  History - key components related to admission: Patient is a 40 year old woman with a history of hypertrophic cardiomyopathy, asthma, and anxiety admitted with complaint of watery diarrhea, nausea, and vomiting.  Her boyfriend had a similar recent illness which resolved after a couple of days.  Patient denies abdominal pain, syncope, or recent dizziness.  She also reports orthopnea and exertional dyspnea which have been worse over the past 6 months.  She reports that she is compliant with her medications.   Physical Exam - key components related to admission:  Filed Vitals:   08/15/11 0023 08/15/11 0529 08/15/11 1000 08/15/11 1337  BP: 91/63 92/65 88/57  94/65  Pulse: 74 76 77 80  Temp:  98.1 F (36.7 C)  98.6 F (37 C)  TempSrc:  Oral  Oral  Resp:  18  18  Height:      Weight:  197 lb 3.2 oz (89.449 kg)    SpO2:  97%  97%    Gen.: Alert, no distress Lungs: Clear Heart: Regular; 4/6 systolic murmur Abdomen: Bowel sounds present, soft, nontender Extremities: No edema  Lab results:   Basic Metabolic Panel:  Basename 08/15/11 0700 08/14/11 0730  NA 137 136  K 3.9 3.8  CL 104 103  CO2 25 25  GLUCOSE 84 88  BUN 10 9  CREATININE 0.62 0.59  CALCIUM 8.9 9.2  MG -- --  PHOS -- --   Liver Function Tests:  Basename 08/14/11 0730  AST 36  ALT 40*  ALKPHOS 71  BILITOT 1.1  PROT 8.0  ALBUMIN 3.8    CBC:  Basename 08/14/11 0730  WBC 6.6  NEUTROABS --  HGB 13.6  HCT 41.1  MCV 87.4  PLT 211    BNP:  Basename 08/14/11 0730    POCBNP 2451.0*    Thyroid Function Tests:  Basename 08/14/11 1632  TSH 3.150  T4TOTAL --  FREET4 --  T3FREE --  THYROIDAB --    Imaging results:  Dg Chest 2 View  08/14/2011  *RADIOLOGY REPORT*  Clinical Data: Cough, shortness of breath  CHEST - 2 VIEW  Comparison: None.  Findings: Cardiomegaly is noted.  Central mild vascular congestion without convincing pulmonary edema.  No segmental infiltrate.  Mild elevation of the right hemidiaphragm.  Mild degenerative changes thoracic spine.  IMPRESSION: Cardiomegaly.  No segmental infiltrate or convincing pulmonary edema.  Mild elevation of the right hemidiaphragm.  Original Report Authenticated By: Natasha Mead, M.D.    Other results:  EKG: Sinus rhythm; left axis deviation; LVH with QRS widening and repolarization abnormality.  2-D Echocardiogram 08/14/2011: Impressions: Severe asymmetric LVH with SAM; LVOT gradient (peak velocity of 4.1 m/s and mean gradient of 38 mmHg); findings c/w HOCM.   Assessment & Plan by Problem:  1.  Nausea, vomiting, and diarrhea.  Patient reports that her symptoms have now completely resolved; she is taking food and liquid by mouth without difficulty, and she had a formed stool this morning.  Her symptoms were likely due to viral gastroenteritis.  2.  Volume depletion secondary to #1.  Patient's systolic blood pressure is now in the 90s,  which is apparently her baseline.  Plan is mobilize out of bed and make sure her blood pressure is stable; anticipate possible discharge home this afternoon with cardiology followup if she continues to do well today.  3.  Hypertrophic obstructive cardiomyopathy.  Patient will followup with her cardiologist, and according to Dr. Eden Emms and will need septal myectomy in the future.  Plan is continue home doses of metoprolol and diltiazem.

## 2011-08-15 NOTE — Plan of Care (Signed)
Problem: Phase I Progression Outcomes Goal: Hemodynamically stable Outcome: Not Progressing Pt continues with hypotension (SBP in the 70's at times)

## 2011-08-15 NOTE — Discharge Summary (Signed)
Internal Medicine Teaching [facility] Discharge Note  Name: Merrit Waugh MRN: [id number] DOB: 1971-09-18 40 y.o.  Date of Admission: 08/14/2011  5:51 AM Date of Discharge: 08/15/2011 Attending Physician: Farley Ly, MD  Discharge Diagnosis: 1. Hypertrophic obstructive cardiomyopathy 2. Nausea, vomiting likely 2/2 acute gastroenteritis 3. Asthma 4. Anxiety 5. Chronic back pain  Discharge Medications: Current Discharge Medication List    START taking these medications   Details  amitriptyline (ELAVIL) 25 MG tablet Take 1 tablet (25 mg total) by mouth at bedtime. Qty: 30 tablet, Refills: 0    oxyCODONE-acetaminophen (PERCOCET) 10-325 MG per tablet Take 1 tablet by mouth every 6 (six) hours as needed for pain. Qty: 40 tablet, Refills: 0      CONTINUE these medications which have NOT CHANGED   Details  beclomethasone (QVAR) 80 MCG/ACT inhaler Inhale 1 puff into the lungs as needed. For breathing    cetirizine (ZYRTEC) 10 MG tablet Take 10 mg by mouth daily.      metoprolol (TOPROL-XL) 100 MG 24 hr tablet Take 100 mg by mouth daily.      norethindrone (MICRONOR,CAMILA,ERRIN) 0.35 MG tablet Take 1 tablet by mouth daily.      diltiazem (CARDIZEM CD) 120 MG 24 hr capsule Take 120 mg by mouth daily.          Disposition and follow-up:   Ms.Imaya Bjorn was discharged from [facility] in Stable condition.    Follow-up Appointments: Follow-up Information    Follow up with Tonny Bollman, MD on 08/30/2011.   Contact information:   [address] [place] [address] [phone]       Follow up with [facility] on 08/22/2011.   Contact information:   [address] [place] [address] [phone]      Follow up with Eustaquio Boyden, MD on 08/30/2011.   Contact information:   [address] [place] [address] [phone]           Discharge Orders    Future Appointments: Provider: Department: Dept Phone: Center:   08/20/2011 2:30 PM Beatrice Lecher, PA [facility] [phone] [id number]   08/30/2011 10:30 AM Eustaquio Boyden, MD [facility] [phone] [email or web address]   08/30/2011 3:30 PM Micheline Chapman, MD [facility] [phone] [email or web address]     Future Orders Please Complete By Expires   Diet - low sodium heart healthy      Increase activity slowly      Discharge instructions      Comments:   Please follow up with your appointments on 11/22, 11/30, and 11/30      Consultations: Treatment Team:  Wendall Stade, MD  Procedures Performed:  Dg Chest 2 View  08/14/2011  *RADIOLOGY REPORT*  Clinical Data: Cough, shortness of breath  CHEST - 2 VIEW  Comparison: None.  Findings: Cardiomegaly is noted.  Central mild vascular congestion without convincing pulmonary edema.  No segmental infiltrate.  Mild elevation of the right hemidiaphragm.  Mild degenerative changes thoracic spine.  IMPRESSION: Cardiomegaly.  No segmental infiltrate or convincing pulmonary edema.  Mild elevation of the right hemidiaphragm.  Original Report Authenticated By: Natasha Mead, M.D.    2D Echo: Study Conclusions  - Left ventricle: The cavity size was normal. Wall thickness was increased in a pattern of severe LVH. There was severe asymmetric hypertrophy. Systolic function was normal. The estimated ejection fraction was in the range  of 50% to 55%. Wall motion was normal; there were no regional wall motion abnormalities. Features are consistent with a pseudonormal left ventricular filling pattern, with concomitant abnormal relaxation and increased filling pressure (grade 2 diastolic dysfunction). Doppler parameters are consistent with high ventricular filling pressure. - Aortic valve: Trivial regurgitation. - Mitral valve: Calcified annulus. Mildly thickened leaflets . There was moderate systolic anterior motion  of the anterior leaflet and posterior leaflet. Mild regurgitation. - Left atrium: The atrium was moderately dilated. - Pulmonary arteries: Systolic pressure was mildly increased. - Pericardium, extracardiac: A small pericardial effusion was identified. Impressions:  - Severe asymmetric LVH with SAM; LVOT gradient (peak velocity of 4.1 m/s and mean gradient of 38 mmHg); findings c/w HOCM. Transthoracic echocardiography. M-mode, complete 2D, spectral Doppler, and color Doppler. Height: Height: 152.4cm. Height: 60in. Weight: Weight: 86.2kg. Weight: 189.6lb. Body mass index: BMI: 37.1kg/m^2. Body surface area: BSA: 1.57m^2. Blood pressure: 83/55. Patient status: Inpatient. Location: Echo laboratory.   Admission HPI:  Patient is a 41 year old woman with a history of obstructive HCM, asthma, and anxiety who presents with an acute history of nausea, vomiting, and diarrhea. Patient says that she had a sinus infection 10 days ago and was seen by urgent care, and was prescribed Augmentin. A few days later, the patient started developing watery diarrhea roughly 2-4 times daily. Then, roughly 2 days ago, the patient began vomiting as many as 5 times per day. The patient says that her boyfriend had a similar diarrheal illness a few days ago, but his resolved without intervention. She denies any hematemesis, melena, or bright red blood per rectum. She also denies any abdominal pain, or chest pain. No fevers or chills. She admits to a dry cough that has persisted throughout the duration of this illness, it is not getting better.   In addition, the patient complains of 6 months of progressive worsening dyspnea on exertion. She does carry the diagnosis of hypertrophic cardiomyopathy, and was last seen by Dr. Excell Seltzer in 2009. She had an echo in January of 2012 that showed an ejection fraction of 60-65%, very small outflow tract, and severe LVH. Her Doppler parameters were consistent with restrictive physiology.  The patient describes a decrease in her exercise tolerance, down from a quarter mile to less than a block. She also endorses orthopnea, now requiring 5 pillows, up from 2. She denies PND, but will wake up in the night coughing. She also admits to occasional dizziness, but denies any palpitations, chest pain, or weight change.   Hospital Course by problem list: 1. HOCM Patient presents with known hypertrophic obstructive cardiomyopathy, but with worsening symptoms over the past 6 months. She was seen by cardiology, and her echocardiogram indicates moderate systolic anterior motion. Nonetheless, given her worsening symptoms, she will need close outpatient followup with possible surgical intervention the near future. She was cleared from a cardiology perspective to continue on her home medicines of metoprolol and diltiazem and to followup as an outpatient. She has appointments on November 22 and November 30 with cardiology.  2. nausea, vomiting, and diarrhea Patient presented with acute nausea, vomiting, and diarrhea concerning for gastroenteritis of infectious etiology. In the setting of recent antibiotic use, there was some concern for C. difficile, however the morning following admission, the patient had a formed stool, which was not acceptable for C. difficile PCR. The patient reported significant symptomatic improvement with mild fluid resuscitation and antiemetics. The morning following admission she was tolerating food well without nausea or vomiting, and had no  further episodes of diarrhea. She was able to ambulate without difficulty and had a blood pressure in the 90s, which is her baseline. She felt ready for discharge.  3. Asthma  The patient was continued on her outpatient medications, and asthma was not active hospital issue.  4. Anxiety This was not addressed during this patient's hospitalization  5. chronic back pain The patient does have a history of chronic back pain, and is  establishing care with a primary physician on November 30. After lengthy discussion about various pain medicines, chronic pain issues, and the importance of continuity of care, I prescribed a short course of pain medicines to last the patient until her initial visit. We discussed the importance of weight loss, potential physical therapy, other modalities, and again, the importance of continual followup with a provider.  Discharge Vitals:  BP 94/65  Pulse 80  Temp(Src) 98.6 F (37 C) (Oral)  Resp 18  Ht 5' (1.524 m)  Wt 197 lb 3.2 oz (89.449 kg)  BMI 38.51 kg/m2  SpO2 97%  LMP 07/22/2011  Discharge Labs:  Results for orders placed during the hospital encounter of 08/14/11 (from the past 24 hour(s))  TSH     Status: Normal   Collection Time   08/14/11  4:32 PM      Component Value Range   TSH 3.150  0.350 - 4.500 (uIU/mL)  BASIC METABOLIC PANEL     Status: Normal   Collection Time   08/15/11  7:00 AM      Component Value Range   Sodium 137  135 - 145 (mEq/L)   Potassium 3.9  3.5 - 5.1 (mEq/L)   Chloride 104  96 - 112 (mEq/L)   CO2 25  19 - 32 (mEq/L)   Glucose, Bld 84  70 - 99 (mg/dL)   BUN 10  6 - 23 (mg/dL)   Creatinine, Ser 1.61  0.50 - 1.10 (mg/dL)   Calcium 8.9  8.4 - 09.6 (mg/dL)   GFR calc non Af Amer >90  >90 (mL/min)   GFR calc Af Amer >90  >90 (mL/min)    SignedAbner Greenspan, BEN 08/15/2011, 4:08 PM

## 2011-08-15 NOTE — Progress Notes (Signed)
@   Subjective:  Denies SSCP, palpitations or Dyspnea Wants to go home  Objective:  Vital Signs in the last 24 hours:       Wt Readings from Last 1 Encounters:  08/15/11 89.449 kg (197 lb 3.2 oz)    Temp:  [97.2 F (36.2 C)-98.6 F (37 C)] 98.1 F (36.7 C) (11/15 0529) Pulse Rate:  [58-76] 76  (11/15 0529) Resp:  [16-20] 18  (11/15 0529) BP: (76-94)/(48-65) 92/65 mmHg (11/15 0529) SpO2:  [95 %-100 %] 97 % (11/15 0529) Weight:  [89.449 kg (197 lb 3.2 oz)-89.9 kg (198 lb 3.1 oz)] 197 lb 3.2 oz (89.449 kg) (11/15 0529)  Intake/Output from previous day: 11/14 0701 - 11/15 0700 In: 2350 [I.V.:2350] Out: -  Intake/Output from this shift:    Physical Exam: S1/S2 SEM of HOCM  Lungs clear BS positive  No edema Plus 3 distal pulses Skin warm and dry  Lab Results:  Basename 08/14/11 0730  WBC 6.6  HGB 13.6  PLT 211    Basename 08/14/11 0730  NA 136  K 3.8  CL 103  CO2 25  GLUCOSE 88  BUN 9  CREATININE 0.59   No results found for this basename: TROPONINI:2,CK,MB:2 in the last 72 hours Hepatic Function Panel  Basename 08/14/11 0730  PROT 8.0  ALBUMIN 3.8  AST 36  ALT 40*  ALKPHOS 71  BILITOT 1.1  BILIDIR --  IBILI --   No results found for this basename: CHOL in the last 72 hours No results found for this basename: PROTIME in the last 72 hours   Cardiac Studies:  ECG:  Orders placed during the hospital encounter of 08/14/11  . ED EKG  . ED EKG  . EKG 12-LEAD  . EKG 12-LEAD     Telemetry:  NSR no arrythmia  Echo: Severe LVH HOCM with 4.1 m/sec jet  Medications:     . sodium chloride   Intravenous Once  . amitriptyline  25 mg Oral QHS  . diltiazem  120 mg Oral Daily  . enoxaparin  40 mg Subcutaneous Q24H  . fluticasone  1 puff Inhalation BID  . loratadine  10 mg Oral Daily  . metoprolol  100 mg Oral Daily  . norethindrone  1 tablet Oral Daily  . ondansetron      . sodium chloride  500 mL Intravenous Once    Assessment/Plan:  HOCM:   STable  Recent diarheal illness with dehydration  Will arrange F/U with DR Excell Seltzer.  Will need septal myectomy in future   Charlton Haws 08/15/2011, 7:45 AM

## 2011-08-15 NOTE — Progress Notes (Signed)
Subjective: Feels well, no further N/V.  No CP.  Seen by cardiology this a.m.  Objective: Vital signs in last 24 hours: Filed Vitals:   08/14/11 2138 08/15/11 0023 08/15/11 0529 08/15/11 1000  BP: 77/51 91/63 92/65  88/57  Pulse: 60 74 76 77  Temp: 98.3 F (36.8 C)  98.1 F (36.7 C)   TempSrc: Oral  Oral   Resp: 18  18   Height:      Weight:   197 lb 3.2 oz (89.449 kg)   SpO2: 95%  97%    Weight change:   Intake/Output Summary (Last 24 hours) at 08/15/11 1338 Last data filed at 08/15/11 0900  Gross per 24 hour  Intake   1570 ml  Output      0 ml  Net   1570 ml   Physical Exam: Physical Exam GEN: NAD.  Alert and oriented x 3.  Pleasant, conversant, and cooperative to exam. RESP:  CTAB, no w/r/r CARDIOVASCULAR: RRR, S1, S2, 3/6 harsh HSM @ LSB ABDOMEN: soft, NT/ND, NABS EXT: warm and dry. No edema in b/l LE  Lab Results: Basic Metabolic Panel:  Lab 08/15/11 0865 08/14/11 0730  NA 137 136  K 3.9 3.8  CL 104 103  CO2 25 25  GLUCOSE 84 88  BUN 10 9  CREATININE 0.62 0.59  CALCIUM 8.9 9.2  MG -- --  PHOS -- --   Liver Function Tests:  Lab 08/14/11 0730  AST 36  ALT 40*  ALKPHOS 71  BILITOT 1.1  PROT 8.0  ALBUMIN 3.8   CBC:  Lab 08/14/11 0730  WBC 6.6  NEUTROABS --  HGB 13.6  HCT 41.1  MCV 87.4  PLT 211   Cardiac Enzymes: No results found for this basename: CKTOTAL:3,CKMB:3,CKMBINDEX:3,TROPONINI:3 in the last 168 hours BNP:  Lab 08/14/11 0730  POCBNP 2451.0*   Thyroid Function Tests:  Lab 08/14/11 1632  TSH 3.150  T4TOTAL --  FREET4 --  T3FREE --  THYROIDAB --     Micro Results: Recent Results (from the past 240 hour(s))  CULTURE, BLOOD (ROUTINE X 2)     Status: Normal (Preliminary result)   Collection Time   08/14/11 12:15 PM      Component Value Range Status Comment   Specimen Description BLOOD RIGHT HAND   Final    Special Requests BOTTLES DRAWN AEROBIC ONLY 5CC   Final    Setup Time [id number]   Final    Culture     Final      Value:        BLOOD CULTURE RECEIVED NO GROWTH TO DATE CULTURE WILL BE HELD FOR 5 DAYS BEFORE ISSUING A FINAL NEGATIVE REPORT   Report Status PENDING   Incomplete   CULTURE, BLOOD (ROUTINE X 2)     Status: Normal (Preliminary result)   Collection Time   08/14/11 12:25 PM      Component Value Range Status Comment   Specimen Description BLOOD LEFT ANTECUBITAL   Final    Special Requests BOTTLES DRAWN AEROBIC ONLY 5CC   Final    Setup Time [id number]   Final    Culture     Final    Value:        BLOOD CULTURE RECEIVED NO GROWTH TO DATE CULTURE WILL BE HELD FOR 5 DAYS BEFORE ISSUING A FINAL NEGATIVE REPORT   Report Status PENDING   Incomplete    Studies/Results: Dg Chest 2 View  08/14/2011  *RADIOLOGY REPORT*  Clinical Data: Cough, shortness  of breath  CHEST - 2 VIEW  Comparison: None.  Findings: Cardiomegaly is noted.  Central mild vascular congestion without convincing pulmonary edema.  No segmental infiltrate.  Mild elevation of the right hemidiaphragm.  Mild degenerative changes thoracic spine.  IMPRESSION: Cardiomegaly.  No segmental infiltrate or convincing pulmonary edema.  Mild elevation of the right hemidiaphragm.  Original Report Authenticated By: Natasha Mead, M.D.   Medications: I have reviewed the patient's current medications. Scheduled Meds:   . amitriptyline  25 mg Oral QHS  . diltiazem  120 mg Oral Daily  . enoxaparin  40 mg Subcutaneous Q24H  . fluticasone  1 puff Inhalation BID  . loratadine  10 mg Oral Daily  . metoprolol  100 mg Oral Daily  . norethindrone  1 tablet Oral Daily  . ondansetron      . DISCONTD: diltiazem  120 mg Oral Daily  . DISCONTD: metoprolol  100 mg Oral Daily   Continuous Infusions:   . sodium chloride 100 mL/hr at 08/14/11 2323  . DISCONTD: sodium chloride 100 mL/hr at 08/14/11 1200   PRN Meds:.acetaminophen, acetaminophen, guaiFENesin, ondansetron (ZOFRAN) IV, ondansetron Assessment/Plan: #Nausea, vomiting, diarrhea  Pt has experienced  rapid resolution of sx.  Had formed stool this am that could not be tested for Cdiff b/c it wasn't liquid.  No N/V/D.  Tolerated breakfast and lunch thus far without incident.  No abd pain.  Bp's stable in the 80s-90s, which is near baseline.  No dizziness.  Appears well.  Afebrile.  Appears stable for d/c. - d/c IVF - encourage PO intake - likely d/c today  #HOCM  Seen by cardiology, advise for outpt w/u in the next few weeks.  Will f/u blood cx, but low suspicion at this point.  Pt has f/u appts with Adolph Pollack on 11/22 and 11/30.  May need surgical intervention. - f/u with cards - con't meds   LOS: 1 day   WILDMAN-TOBRINER, BEN 08/15/2011, 1:38 PM

## 2011-08-15 NOTE — Progress Notes (Signed)
Pt discharged home in stable condition with all belongings and friend. Medications and discharge instructions reviewed prior to discharge.

## 2011-08-18 ENCOUNTER — Encounter: Payer: Self-pay | Admitting: Internal Medicine

## 2011-08-20 ENCOUNTER — Ambulatory Visit: Payer: 59 | Admitting: Physician Assistant

## 2011-08-20 LAB — CULTURE, BLOOD (ROUTINE X 2)
Culture  Setup Time: 201211142159
Culture: NO GROWTH

## 2011-08-30 ENCOUNTER — Encounter: Payer: Self-pay | Admitting: Family Medicine

## 2011-08-30 ENCOUNTER — Ambulatory Visit (INDEPENDENT_AMBULATORY_CARE_PROVIDER_SITE_OTHER): Payer: 59 | Admitting: Family Medicine

## 2011-08-30 ENCOUNTER — Ambulatory Visit (INDEPENDENT_AMBULATORY_CARE_PROVIDER_SITE_OTHER): Payer: 59 | Admitting: Cardiovascular Disease

## 2011-08-30 ENCOUNTER — Encounter: Payer: Self-pay | Admitting: Cardiovascular Disease

## 2011-08-30 VITALS — BP 86/48 | HR 80 | Ht 60.0 in | Wt 194.4 lb

## 2011-08-30 VITALS — BP 134/78 | HR 72 | Temp 98.6°F | Ht 60.0 in | Wt 195.2 lb

## 2011-08-30 DIAGNOSIS — F411 Generalized anxiety disorder: Secondary | ICD-10-CM

## 2011-08-30 DIAGNOSIS — I429 Cardiomyopathy, unspecified: Secondary | ICD-10-CM

## 2011-08-30 DIAGNOSIS — M549 Dorsalgia, unspecified: Secondary | ICD-10-CM

## 2011-08-30 DIAGNOSIS — I422 Other hypertrophic cardiomyopathy: Secondary | ICD-10-CM

## 2011-08-30 MED ORDER — NAPROXEN 500 MG PO TABS: ORAL_TABLET | ORAL | Status: DC

## 2011-08-30 NOTE — Assessment & Plan Note (Signed)
Has cards appt this afternoon. May discuss surgical intervention. Seems stable from [facility] standpoint. Per pt bp higher than normal for her, reports compliance with bp meds.

## 2011-08-30 NOTE — Assessment & Plan Note (Signed)
stable off meds.  Will request records from prior PCP.

## 2011-08-30 NOTE — Patient Instructions (Signed)
REFERRAL TO DR Nolon Rod AT [facility] FOR HOCM [phone]  Your physician recommends that you schedule a follow-up appointment in: TBD AFTER SEEING DR Regino Schultze

## 2011-08-30 NOTE — Progress Notes (Signed)
HPI:  [name] is a 40 year old woman presenting for followup evaluation. I last saw her in our [place] office on August 15, 2008. She was recently hospitalized with nausea, vomiting, and diarrhea. Cardiology was asked to evaluate her because of hypotension and she was seen by Dr. Eden Emms.  The patient carries a diagnosis of hypertrophic cardiomyopathy, but she has not been reliable in followup until recently.  She was first diagnosed with a cardiac murmur when she was 40 years old, but apparently was not diagnosed with hypertrophic cardiomyopathy until her first pregnancy several years later.  She had an echo during her recent hospitalization showing normal left ventricular systolic function with an ejection fraction of 50-55%. Doppler parameters were consistent with pseudonormalization ventricular filling pattern (grade 2 diastolic dysfunction). There was systolic anterior motion of the mitral valve, mild mitral regurgitation, and left atrial enlargement. There was severe asymmetric LVH noted with a peak velocity of 4.1 m/s in the LV outflow tract. The patient's interventricular septal thickness was calculated at 28 mm and her posterior wall thickness was calculated at 19 mm.  The patient has no history of lightheadedness or syncope. She was noted to have low blood pressures on several blood pressure checks during her hospitalization. There is no family history of sudden cardiac death. She complains of dyspnea with exertion at a low level. This is chronic and unchanged over time. She denies exertional chest pain or pressure. She denies lower extremity edema, orthopnea, or PND.  Outpatient Encounter Prescriptions as of 08/30/2011  Medication Sig Dispense Refill  . amitriptyline (ELAVIL) 25 MG tablet Take 1 tablet (25 mg total) by mouth at bedtime.  30 tablet  0  . beclomethasone (QVAR) 80 MCG/ACT inhaler Inhale 1 puff into the lungs as needed. For breathing      . cetirizine (ZYRTEC) 10 MG tablet Take  10 mg by mouth daily.        Marland Kitchen diltiazem (CARDIZEM CD) 120 MG 24 hr capsule Take 120 mg by mouth daily.        . metoprolol (TOPROL-XL) 100 MG 24 hr tablet Take 100 mg by mouth daily.        . naproxen (NAPROSYN) 500 MG tablet Take one po bid x 1 week then prn pain, take with food  60 tablet  0  . norethindrone (MICRONOR,CAMILA,ERRIN) 0.35 MG tablet Take 1 tablet by mouth daily.       Marland Kitchen oxyCODONE-acetaminophen (PERCOCET) 10-325 MG per tablet Take 1 tablet by mouth as needed.          No Known Allergies  Past Medical History  Diagnosis Date  . Secondary cardiomyopathy, unspecified     HOCM  . Anxiety state, unspecified   . Allergic rhinitis     to dogs  . Asthma     no recent flare  . Anemia     during pregnancy  . History of chicken pox   . Generalized headaches   . Migraines     ocular, no HA  . Back pain     told cracked bone, vicodin didn't help    ROS: Negative except as per HPI  BP 86/48  Pulse 80  Ht 5' (1.524 m)  Wt 88.179 kg (194 lb 6.4 oz)  BMI 37.97 kg/m2  LMP 08/14/2011  PHYSICAL EXAM: Pt is alert and oriented, NAD HEENT: normal Neck: JVP - normal, carotids 2+= without bruits Lungs: CTA bilaterally CV: RRR with grade 3/6 mid peaking systolic murmur with paradoxical split S2 Abd: soft,  NT, Positive BS, no hepatomegaly Ext: no C/C/E, distal pulses intact and equal Skin: warm/dry no rash  EKG:  08/15/2011: Heart rate 75 beats per minute with normal sinus rhythm. There is LVH with QRS widening and marked ST/T-wave abnormality, left axis deviation  ASSESSMENT AND PLAN:

## 2011-08-30 NOTE — Progress Notes (Signed)
Subjective:    Patient ID: [name], female    DOB: 02-08-1971, 40 y.o.   MRN: [id number]  HPI CC: new pt establish  Prior saw Dr. [facility].  No concerns today, presents to establish care.  Recent hospitalization for worsening DOE.  Reviewed D/C summary.  To see Dr. Excell Seltzer this afternoon.  On diltiazem and toprol xl for HOCM.  Considering surgery for heart.  No recent chest pain/tightness.  + SOB.  No further diarrhea/ nausea/ vomiting.  Chronic back pain - "feels like I have to stretch it out".  takes percocets prn, previously prescribed by Dr. Nicholas Lose.  Has tried tramadol, acetaminophen, vicodin in past.  Would like refill of percocet.  Has not tried NSAID in past.  No recent imaging.  Has seen chiropractor in past, told had cracked back.  H/o anxiety with attacks in past, none recently.  Preventative: Well woman with OBGYN 2d ago.  H/o abnormal in past s/p LEEP 2009.  mammogram normal 2009. Tetanus - declines. Flu shot - will return here.  Medications and allergies reviewed and updated in chart.  Past histories reviewed and updated if relevant as below. Patient Active Problem List  Diagnoses  . ANXIETY  . CARDIOMYOPATHY, SECONDARY  . Asthma   Past Medical History  Diagnosis Date  . Secondary cardiomyopathy, unspecified     HOCM  . Anxiety state, unspecified   . Rhinitis     to dogs  . Asthma   . Anemia     during pregnancy  . History of chicken pox   . Generalized headaches   . Migraines     ocular, no HA   Past Surgical History  Procedure Date  . Cesarean section 1991  . Induced abortion 1990 and 1993  . US echocardiography 08/2011    severe LVH, severe asymmetric hypertrophy, EF 50-55%, grade 2 diastolic dysfunction   History  Substance Use Topics  . Smoking status: Former Smoker -- 1.0 packs/day for 3 years    Types: Cigarettes    Quit date: 09/30/1988  . Smokeless tobacco: Never Used   Comment: quitin 1990  . Alcohol Use: 0.0 oz/week   rarely drinks wine   Family History  Problem Relation Age of Onset  . Diabetes Father   . Thyroid disease Mother   . Heart disease Maternal Grandfather     CHF  . Diabetes Paternal Grandfather   . Cancer Paternal Grandfather   . Coronary artery disease Neg Hx   . Stroke Neg Hx    No Known Allergies Current Outpatient Prescriptions on File Prior to Visit  Medication Sig Dispense Refill  . amitriptyline (ELAVIL) 25 MG tablet Take 1 tablet (25 mg total) by mouth at bedtime.  30 tablet  0  . beclomethasone (QVAR) 80 MCG/ACT inhaler Inhale 1 puff into the lungs as needed. For breathing      . cetirizine (ZYRTEC) 10 MG tablet Take 10 mg by mouth daily.        Marland Kitchen diltiazem (CARDIZEM CD) 120 MG 24 hr capsule Take 120 mg by mouth daily.        . metoprolol (TOPROL-XL) 100 MG 24 hr tablet Take 100 mg by mouth daily.        . norethindrone (MICRONOR,CAMILA,ERRIN) 0.35 MG tablet Take 1 tablet by mouth daily.        Review of Systems  Constitutional: Negative for fever, chills, activity change, appetite change, fatigue and unexpected weight change.  HENT: Negative for hearing loss and  neck pain.   Eyes: Negative for visual disturbance.  Respiratory: Positive for shortness of breath. Negative for cough, chest tightness and wheezing.   Cardiovascular: Negative for chest pain, palpitations and leg swelling.  Gastrointestinal: Negative for nausea, vomiting, abdominal pain, diarrhea, constipation, blood in stool and abdominal distention.  Genitourinary: Negative for hematuria and difficulty urinating.  Musculoskeletal: Negative for myalgias and arthralgias.  Skin: Negative for rash.  Neurological: Negative for dizziness, seizures, syncope and headaches.  Hematological: Does not bruise/bleed easily.  Psychiatric/Behavioral: Negative for dysphoric mood. The patient is nervous/anxious.        Objective:   Physical Exam  Nursing note and vitals reviewed. Constitutional: She is oriented to person,  place, and time. She appears well-developed and well-nourished. No distress.  HENT:  Head: Normocephalic and atraumatic.  Right Ear: External ear normal.  Left Ear: External ear normal.  Nose: Nose normal.  Mouth/Throat: Oropharynx is clear and moist. No oropharyngeal exudate.  Eyes: Conjunctivae and EOM are normal. Pupils are equal, round, and reactive to light. No scleral icterus.  Neck: Normal range of motion. Neck supple. No thyromegaly present.  Cardiovascular: Normal rate, regular rhythm and intact distal pulses.   Murmur (4/6 SEM with radiation to carotids) heard. Pulses:      Radial pulses are 2+ on the right side, and 2+ on the left side.  Pulmonary/Chest: Effort normal and breath sounds normal. No respiratory distress. She has no wheezes. She has no rales.  Abdominal: Soft. Bowel sounds are normal. She exhibits no distension and no mass. There is no tenderness. There is no rebound and no guarding.  Musculoskeletal: Normal range of motion. She exhibits no edema.  Lymphadenopathy:    She has no cervical adenopathy.  Neurological: She is alert and oriented to person, place, and time.       CN grossly intact, station and gait intact  Skin: Skin is warm and dry. No rash noted.  Psychiatric: She has a normal mood and affect. Her behavior is normal. Judgment and thought content normal.      Assessment & Plan:

## 2011-08-30 NOTE — Assessment & Plan Note (Addendum)
I had a long discussion with the patient and her boyfriend today.  We reviewed the implications of the diagnosis of hypertrophic cardiomyopathy. She understands the need to restrict vigorous physical activity. She does not have high-risk features of congestive heart failure, atrial dysrhythmias, syncope, or family history of sudden death. However, her LV septal thickness is approaching the severe range. I recommended the patient see Dr. Regino Schultze at [facility] in consultation. If she requires advance therapies such as septal myectomy I would favor her having the surgery at [facility]. In the meantime we'll continue her current medical program which includes long-acting metoprolol and diltiazem. She will continue to push fluids.

## 2011-08-30 NOTE — Patient Instructions (Addendum)
Good to meet you today.  Call us with questions. Trial of naprosyn (anti inflammatory) for back, take regularly for 5 days then as needed. Return for flu shot or receive at pharmacy. Come back as needed.

## 2011-08-30 NOTE — Assessment & Plan Note (Addendum)
Sounds chronic. Has tried tramadol, acetaminophen, vicodin in past.  vicodin caused sedation. No recent imaging. Hesitant to use narcotic without further eval. Trial of NSAID for now.  If not improving, consider imaging vs PT. In meantime, requested records from prior PCP.

## 2011-09-15 ENCOUNTER — Encounter: Payer: Self-pay | Admitting: Family Medicine

## 2011-10-17 ENCOUNTER — Telehealth: Payer: Self-pay

## 2011-10-17 NOTE — Telephone Encounter (Signed)
Dr Excell Seltzer received a letter from Dr Silvestre Mesi at [facility] in regards to evaluation for cardiac surgery.  Per the note it looks like Dr Regino Schultze at [facility] may have also seen the pt for pre-op.  In the document there is a note:  We will leave it up to her local cardiologist and to Dr Regino Schultze as to whether or not they feel she needs a left heart catheteriization, but given her age, and lack of other comorbidities she is at fairly low risk for having CAD.  Dr Excell Seltzer reviewed this note and is unsure if Dr Regino Schultze as already addressed this question. If Dr Regino Schultze has not then Dr Excell Seltzer would recommend a Cardiac CTA to exlude CAD preoperatively.  I left a message for the pt to contact our office.

## 2011-10-22 NOTE — Telephone Encounter (Signed)
Left message for pt to call back  °

## 2011-10-30 ENCOUNTER — Emergency Department (HOSPITAL_COMMUNITY): Payer: 59

## 2011-10-30 ENCOUNTER — Emergency Department (HOSPITAL_COMMUNITY)
Admission: EM | Admit: 2011-10-30 | Discharge: 2011-10-30 | Disposition: A | Discharge status: Home / Self Care | Payer: 59 | Attending: Emergency Medicine | Admitting: Emergency Medicine

## 2011-10-30 ENCOUNTER — Encounter (HOSPITAL_COMMUNITY): Payer: Self-pay | Admitting: *Deleted

## 2011-10-30 DIAGNOSIS — Y9289 Other specified places as the place of occurrence of the external cause: Secondary | ICD-10-CM | POA: Insufficient documentation

## 2011-10-30 DIAGNOSIS — IMO0001 Reserved for inherently not codable concepts without codable children: Secondary | ICD-10-CM | POA: Insufficient documentation

## 2011-10-30 DIAGNOSIS — M546 Pain in thoracic spine: Secondary | ICD-10-CM | POA: Insufficient documentation

## 2011-10-30 DIAGNOSIS — M25569 Pain in unspecified knee: Secondary | ICD-10-CM | POA: Insufficient documentation

## 2011-10-30 DIAGNOSIS — Z79899 Other long term (current) drug therapy: Secondary | ICD-10-CM | POA: Insufficient documentation

## 2011-10-30 DIAGNOSIS — M545 Low back pain, unspecified: Secondary | ICD-10-CM | POA: Insufficient documentation

## 2011-10-30 DIAGNOSIS — W19XXXA Unspecified fall, initial encounter: Secondary | ICD-10-CM

## 2011-10-30 DIAGNOSIS — M25559 Pain in unspecified hip: Secondary | ICD-10-CM | POA: Insufficient documentation

## 2011-10-30 DIAGNOSIS — J45909 Unspecified asthma, uncomplicated: Secondary | ICD-10-CM | POA: Insufficient documentation

## 2011-10-30 DIAGNOSIS — W010XXA Fall on same level from slipping, tripping and stumbling without subsequent striking against object, initial encounter: Secondary | ICD-10-CM | POA: Insufficient documentation

## 2011-10-30 DIAGNOSIS — S8000XA Contusion of unspecified knee, initial encounter: Secondary | ICD-10-CM

## 2011-10-30 MED ORDER — IBUPROFEN 800 MG PO TABS
800.0000 mg | ORAL_TABLET | Freq: Once | ORAL | Status: AC
  Administered 2011-10-30: 800 mg via ORAL
  Filled 2011-10-30: qty 1

## 2011-10-30 MED ORDER — IBUPROFEN 800 MG PO TABS: 800.0000 mg | ORAL_TABLET | Freq: Three times a day (TID) | ORAL | Status: AC

## 2011-10-30 NOTE — ED Notes (Signed)
Reports slipping on puddle this am and falling, having lower back pain, right knee pain, right shoulder pain. Ambulatory at triage.

## 2011-10-30 NOTE — ED Notes (Signed)
Reports normally having low bp.

## 2011-10-30 NOTE — ED Provider Notes (Signed)
History     CSN: [id number]  Arrival date & time 10/30/11  1055   First MD Initiated Contact with Patient 10/30/11 1115      Chief Complaint  Patient presents with  . Fall  . Back Pain    (Consider location/radiation/quality/duration/timing/severity/associated sxs/prior treatment) HPI Comments: History of hypertrophic cardiomyopathy presenting after mechanical fall.  She tripped in a puddle in the parking lot at [employer]. Her right leg went out from under her and she landed on her low back. She was able to get up on her own and walk without problem. She denies hitting her head or losing consciousness. She has pain in the right side of her back and right knee she denies any weakness, numbness or tingling. She denies any bowel or bladder incontinence. She has no chest pain or abdominal pain.  Patient is a 41 y.o. female presenting with fall and back pain. The history is provided by the patient.  Fall The accident occurred less than 1 hour ago. The fall occurred while walking. She fell from a height of 3 to 5 ft. She landed on concrete. There was no blood loss. The point of impact was the right hip and right knee. The pain is mild. Pertinent negatives include no fever, no abdominal pain, no nausea, no vomiting and no headaches.  Back Pain  Pertinent negatives include no chest pain, no fever, no headaches, no abdominal pain and no dysuria.    Past Medical History  Diagnosis Date  . Secondary cardiomyopathy, unspecified     HOCM  . Anxiety state, unspecified   . Allergic rhinitis     to dogs  . Asthma     no recent flare  . History of anemia     during pregnancy  . History of chicken pox   . Generalized headaches   . Ocular migraine     no HA  . Back pain     told cracked bone, vicodin didn't help, improved with percocets/muscle relaxants, no eval by prior PCP    Past Surgical History  Procedure Date  . Cesarean section 1991  . Induced abortion 1990 and 1993  . US  echocardiography 08/2011    severe LVH, severe asymmetric hypertrophy, EF 50-55%, grade 2 diastolic dysfunction    Family History  Problem Relation Age of Onset  . Diabetes Father   . Thyroid disease Mother   . Heart disease Maternal Grandfather     CHF  . Diabetes Paternal Grandfather   . Cancer Paternal Grandfather   . Coronary artery disease Neg Hx   . Stroke Neg Hx   . Anemia Mother     mediterranean anemia, ITP    History  Substance Use Topics  . Smoking status: Former Smoker -- 1.0 packs/day for 3 years    Types: Cigarettes    Quit date: 09/30/1988  . Smokeless tobacco: Never Used   Comment: quitin 1990  . Alcohol Use: 0.0 oz/week     rarely drinks wine    OB History    Grav Para Term Preterm Abortions TAB SAB Ect Mult Living                  Review of Systems  Constitutional: Negative for fever, activity change and appetite change.  HENT: Negative for congestion, rhinorrhea and neck pain.   Respiratory: Negative for cough and shortness of breath.   Cardiovascular: Negative for chest pain.  Gastrointestinal: Negative for nausea, vomiting and abdominal pain.  Genitourinary:  Negative for dysuria.  Musculoskeletal: Positive for myalgias, back pain and arthralgias.  Skin: Negative for rash.  Neurological: Negative for headaches.    Allergies  Review of patient's allergies indicates no known allergies.  Home Medications   Current Outpatient Rx  Name Route Sig Dispense Refill  . BECLOMETHASONE DIPROPIONATE 80 MCG/ACT IN AERS Inhalation Inhale 1 puff into the lungs as needed. For breathing    . CETIRIZINE HCL 10 MG PO TABS Oral Take 10 mg by mouth daily.      Marland Kitchen DILTIAZEM HCL ER COATED BEADS 120 MG PO CP24 Oral Take 120 mg by mouth daily.      Marland Kitchen METOPROLOL SUCCINATE ER 100 MG PO TB24 Oral Take 100 mg by mouth daily.      Marland Kitchen NAPROXEN 500 MG PO TABS  Take one po bid x 1 week then prn pain, take with food 60 tablet 0  . NORETHINDRONE 0.35 MG PO TABS Oral Take 1  tablet by mouth daily.       BP 86/54  Pulse 67  Temp(Src) 98.1 F (36.7 C) (Oral)  Resp 18  SpO2 96%  LMP 10/14/2011  Physical Exam  Constitutional: She is oriented to person, place, and time. She appears well-developed and well-nourished. No distress.  HENT:  Head: Normocephalic and atraumatic.  Eyes: Conjunctivae are normal. Pupils are equal, round, and reactive to light.  Neck: Normal range of motion. Neck supple.       No C-spine pain step-off or deformity  Cardiovascular: Normal rate, regular rhythm and normal heart sounds.   Pulmonary/Chest: Effort normal and breath sounds normal. No respiratory distress.  Abdominal: Soft. There is no tenderness. There is no rebound and no guarding.  Musculoskeletal: She exhibits tenderness.       Right knee diffusely tender to palpation. Flexion extension intact, no ligament instability No pain or step-off in T or L spine. There is right paraspinal muscle tenderness in the thoracic and lumbar spine  Neurological: She is alert and oriented to person, place, and time. No cranial nerve deficit.  Skin: Skin is warm.    ED Course  Procedures (including critical care time)  Labs Reviewed - No data to display Dg Lumbar Spine Complete  10/30/2011  *RADIOLOGY REPORT*  Clinical Data: Fall, back pain.  LUMBAR SPINE - COMPLETE 4+ VIEW  Comparison: None  Findings: There is partial sacralization of L5.  Normal alignment. No fracture.  Disc spaces are maintained.  Visualized bony pelvis is unremarkable.  IMPRESSION: No acute bony abnormality.  Original Report Authenticated By: Cyndie Chime, M.D.   Dg Knee Complete 4 Views Right  10/30/2011  *RADIOLOGY REPORT*  Clinical Data: Fall, right knee pain.  RIGHT KNEE - COMPLETE 4+ VIEW  Comparison: None  Findings: No acute bony abnormality.  Specifically, no fracture, subluxation, or dislocation.  Soft tissues are intact.  No joint effusion.  IMPRESSION: Normal study.  Original Report Authenticated By: Cyndie Chime, M.D.     No diagnosis found.    MDM  Mechanical fall with knee and low back pain. Neurologically intact, no red flags.  Patient states baseline blood pressures in the 80s. This is confirmed on old record review.  X-rays negative. Patient's ambulatory. Discharged with contusion instructions      Glynn Octave, MD 10/30/11 1220

## 2011-10-31 NOTE — Telephone Encounter (Signed)
I left a message with Dr Benjamine Sprague office to contact me in regards to the status of the pt's surgery.

## 2011-11-05 NOTE — Telephone Encounter (Signed)
Dr Excell Seltzer aware of my conversation with patient.  Since the pt had a stress test at [facility] she does not require any other testing at this time.  If the pt requires clearance from our office then the pt will call us back.

## 2011-11-05 NOTE — Telephone Encounter (Signed)
I spoke with the pt and she said that a stress test was done at [facility] and they told her that it was okay.  The pt is not scheduled for her surgery at this time.  The pt said she will contact our office once she is scheduled for her surgery.  The pt said that she had instructed Dr Benjamine Sprague office to contact our office to see if she needed any other testing.

## 2011-11-06 ENCOUNTER — Other Ambulatory Visit: Payer: Self-pay | Admitting: *Deleted

## 2011-11-06 MED ORDER — DILTIAZEM HCL ER COATED BEADS 120 MG PO CP24: 120.0000 mg | ORAL_CAPSULE | Freq: Every day | ORAL | Status: DC

## 2011-11-06 MED ORDER — METOPROLOL SUCCINATE ER 100 MG PO TB24: 100.0000 mg | ORAL_TABLET | Freq: Every day | ORAL | Status: DC

## 2011-11-06 NOTE — Telephone Encounter (Signed)
Patient walked in and requested refills on meds. She said she had run out completely. Rx sent in as requested. Advised to give 48 hours notice for further refills.

## 2011-11-06 NOTE — Telephone Encounter (Signed)
Patient walked in and requested 1 pill of meds to be sent to pharmacy near her work to get her through the day until she can get by her regular pharmacy to pick up the whole refill. She had no meds left and needed refill today. Sent to [facility] on [address] as requested.

## 2011-11-12 ENCOUNTER — Telehealth: Payer: Self-pay | Admitting: *Deleted

## 2011-11-12 NOTE — Telephone Encounter (Signed)
Patient called and requests an ortho referral for her back pain.. The naprosyn has been no help and she is in constant pain. She prefers to go to someone in [place]. Advised to expect a call from [place].

## 2011-11-12 NOTE — Telephone Encounter (Signed)
i would like her to come in to re evaluate this first.

## 2011-11-13 NOTE — Telephone Encounter (Signed)
Message left advising patient to call and schedule an appt.

## 2011-11-25 ENCOUNTER — Encounter: Payer: Self-pay | Admitting: Family Medicine

## 2011-11-25 ENCOUNTER — Ambulatory Visit (INDEPENDENT_AMBULATORY_CARE_PROVIDER_SITE_OTHER): Payer: 59 | Admitting: Family Medicine

## 2011-11-25 VITALS — BP 84/60 | HR 84 | Temp 98.3°F | Ht 60.0 in | Wt 196.8 lb

## 2011-11-25 DIAGNOSIS — M549 Dorsalgia, unspecified: Secondary | ICD-10-CM

## 2011-11-25 DIAGNOSIS — J069 Acute upper respiratory infection, unspecified: Secondary | ICD-10-CM

## 2011-11-25 MED ORDER — HYDROCODONE-ACETAMINOPHEN 5-500 MG PO TABS: 1.0000 | ORAL_TABLET | Freq: Every evening | ORAL | Status: AC | PRN

## 2011-11-25 NOTE — Patient Instructions (Addendum)
For your cold - try zinc losenges for the next 3 days  Drink lots of fluids Start on mucinex - helps chest and head congestion  For sore throat - chloraseptic throat spray  Update if not starting to improve in a week or if worsening   For neck and back we will refer to orthopedics at check out  Try vicodin for severe pain only at night - use sparingly --for the short term

## 2011-11-25 NOTE — Assessment & Plan Note (Signed)
In former smoker No reactive airways Disc symptomatic care - see instructions on AVS  Update if not starting to improve in a week or if worsening

## 2011-11-25 NOTE — Progress Notes (Signed)
Subjective:    Patient ID: [name], female    DOB: 02-Aug-1971, 41 y.o.   MRN: [id number]  HPI Here for back and neck pain and also congestion   Has had neck and thoracic back pain - rad to her arms  Really bad over the past year No injury known  Never had any x rays at all Wants to see orthopedics  Naproxen does not help  Is not sleeping well at night  Muscle relaxers in past - was on flexeril , tramadol - none of these help  No numbness or weakness in arms     Was given oxycodone ? In the hospital along with amytriptyline   Started congestion over last few days  Lot of post nasal drip  Some cough No colored mucous Dry cough right now  No fever  Headache all the time - is chronic - has migraine (? If related to neck and back)    Worse allergies also   Patient Active Problem List  Diagnoses  . ANXIETY  . CARDIOMYOPATHY, SECONDARY  . Asthma  . Back pain  . Hypertrophic cardiomyopathy  . Viral URI   Past Medical History  Diagnosis Date  . Secondary cardiomyopathy, unspecified     HOCM  . Anxiety state, unspecified   . Allergic rhinitis     to dogs  . Asthma     no recent flare  . History of anemia     during pregnancy  . History of chicken pox   . Generalized headaches   . Ocular migraine     no HA  . Back pain     told cracked bone, vicodin didn't help, improved with percocets/muscle relaxants, no eval by prior PCP   Past Surgical History  Procedure Date  . Cesarean section 1991  . Induced abortion 1990 and 1993  . US echocardiography 08/2011    severe LVH, severe asymmetric hypertrophy, EF 50-55%, grade 2 diastolic dysfunction   History  Substance Use Topics  . Smoking status: Former Smoker -- 1.0 packs/day for 3 years    Types: Cigarettes    Quit date: 09/30/1988  . Smokeless tobacco: Never Used   Comment: quitin 1990  . Alcohol Use: 0.0 oz/week     rarely drinks wine   Family History  Problem Relation Age of Onset  . Diabetes Father     . Heart disease Father     unspecified heart problem  . Thyroid disease Mother   . Anemia Mother     mediterranean anemia, ITP  . Heart disease Maternal Grandfather     CHF  . Diabetes Paternal Grandfather   . Cancer Paternal Grandfather   . Coronary artery disease Neg Hx   . Stroke Neg Hx    No Known Allergies Current Outpatient Prescriptions on File Prior to Visit  Medication Sig Dispense Refill  . beclomethasone (QVAR) 80 MCG/ACT inhaler Inhale 1 puff into the lungs as needed. For breathing      . cetirizine (ZYRTEC) 10 MG tablet Take 10 mg by mouth daily.        Marland Kitchen diltiazem (CARDIZEM CD) 120 MG 24 hr capsule Take 1 capsule (120 mg total) by mouth daily.  30 capsule  3  . metoprolol succinate (TOPROL-XL) 100 MG 24 hr tablet Take 1 tablet (100 mg total) by mouth daily.  30 tablet  3  . naproxen (NAPROSYN) 500 MG tablet Take one po bid x 1 week then prn pain, take with food  60 tablet  0  . norethindrone (MICRONOR,CAMILA,ERRIN) 0.35 MG tablet Take 1 tablet by mouth daily.                Review of Systems Review of Systems  Constitutional: Negative for fever, appetite change, and unexpected weight change. pos for fatigue  ENT pos for congestion/ rhinorrhea/ st without sinus pain  Eyes: Negative for pain and visual disturbance.  Respiratory: Negative for sob or wheeze   Cardiovascular: Negative for cp or palpitations    Gastrointestinal: Negative for nausea, diarrhea and constipation.  Genitourinary: Negative for urgency and frequency.  Skin: Negative for pallor or rash   MSK pos for neck and upper back pain with rad to arms/ neg for tingling or weakness  Neurological: Negative for weakness, light-headedness, numbness and headaches.  Hematological: Negative for adenopathy. Does not bruise/bleed easily.  Psychiatric/Behavioral: Negative for dysphoric mood. The patient is not nervous/anxious.         Objective:   Physical Exam  Constitutional: She appears well-developed  and well-nourished. No distress.  HENT:  Head: Normocephalic and atraumatic.  Right Ear: External ear normal.  Left Ear: External ear normal.  Mouth/Throat: Oropharynx is clear and moist. No oropharyngeal exudate.       Nares are injected and congested  No sinus tenderness TMs dull but clear Post nasal drip noted   Eyes: Conjunctivae and EOM are normal. Pupils are equal, round, and reactive to light. No scleral icterus.  Neck: Neck supple. No JVD present. Spinous process tenderness and muscular tenderness present. Decreased range of motion present. No tracheal deviation and no edema present. No thyromegaly present.       Tender over entire CS bones and musculature  Also trap  Flex full Ext 30 deg Hurts more to turn and tilt L  Cardiovascular: Normal rate and regular rhythm.  Exam reveals no gallop.   Murmur heard. Pulmonary/Chest: Effort normal and breath sounds normal. No respiratory distress. She has no wheezes.  Musculoskeletal: She exhibits tenderness. She exhibits no edema.       Thoracic back: She exhibits decreased range of motion, tenderness, bony tenderness and spasm. She exhibits no edema.  Lymphadenopathy:    She has no cervical adenopathy.  Neurological: She is alert. She has normal reflexes. She displays no atrophy and no tremor. A cranial nerve deficit is present. No sensory deficit. She exhibits normal muscle tone. Coordination normal.  Skin: Skin is warm and dry. No rash noted. No erythema. No pallor.  Psychiatric: She has a normal mood and affect.          Assessment & Plan:

## 2011-11-25 NOTE — Assessment & Plan Note (Signed)
Ongoing with severe acute on chronic pain / poor rom and no concrete dx  Offered to do films today- but pt wants to see ortho [name]) Ref done Long list of med that do not help  Trouble sleeping - will try very short term course of pm vicodin (disc sed potential) to get by until ortho appt  Disc heat/ ice / stretching

## 2011-12-30 DIAGNOSIS — Z9889 Other specified postprocedural states: Secondary | ICD-10-CM

## 2011-12-30 HISTORY — DX: Other specified postprocedural states: Z98.890

## 2012-01-27 HISTORY — PX: CARDIAC SURGERY: SHX584

## 2012-01-29 DIAGNOSIS — I82409 Acute embolism and thrombosis of unspecified deep veins of unspecified lower extremity: Secondary | ICD-10-CM

## 2012-01-29 HISTORY — DX: Acute embolism and thrombosis of unspecified deep veins of unspecified lower extremity: I82.409

## 2012-01-29 HISTORY — PX: CARDIAC DEFIBRILLATOR PLACEMENT: SHX171

## 2012-02-13 ENCOUNTER — Telehealth: Payer: Self-pay | Admitting: Family Medicine

## 2012-02-13 ENCOUNTER — Emergency Department (HOSPITAL_COMMUNITY): Payer: 59

## 2012-02-13 ENCOUNTER — Emergency Department (HOSPITAL_COMMUNITY)
Admission: EM | Admit: 2012-02-13 | Discharge: 2012-02-13 | Disposition: A | Discharge status: Home / Self Care | Payer: 59 | Attending: Emergency Medicine | Admitting: Emergency Medicine

## 2012-02-13 ENCOUNTER — Encounter (HOSPITAL_COMMUNITY): Payer: Self-pay

## 2012-02-13 ENCOUNTER — Emergency Department (HOSPITAL_COMMUNITY)
Admission: EM | Admit: 2012-02-13 | Discharge: 2012-02-13 | Disposition: A | Discharge status: Nursing Home (Includes ICF) | Payer: 59 | Source: Home / Self Care | Attending: Emergency Medicine | Admitting: Emergency Medicine

## 2012-02-13 DIAGNOSIS — M7989 Other specified soft tissue disorders: Secondary | ICD-10-CM

## 2012-02-13 DIAGNOSIS — I82629 Acute embolism and thrombosis of deep veins of unspecified upper extremity: Secondary | ICD-10-CM | POA: Insufficient documentation

## 2012-02-13 DIAGNOSIS — M79609 Pain in unspecified limb: Secondary | ICD-10-CM

## 2012-02-13 DIAGNOSIS — Z79899 Other long term (current) drug therapy: Secondary | ICD-10-CM | POA: Insufficient documentation

## 2012-02-13 DIAGNOSIS — M79629 Pain in unspecified upper arm: Secondary | ICD-10-CM

## 2012-02-13 DIAGNOSIS — I82622 Acute embolism and thrombosis of deep veins of left upper extremity: Secondary | ICD-10-CM

## 2012-02-13 DIAGNOSIS — J45909 Unspecified asthma, uncomplicated: Secondary | ICD-10-CM | POA: Insufficient documentation

## 2012-02-13 LAB — PROTIME-INR: Prothrombin Time: 15.9 seconds — ABNORMAL HIGH (ref 11.6–15.2)

## 2012-02-13 MED ORDER — HYDROCODONE-ACETAMINOPHEN 5-325 MG PO TABS: 1.0000 | ORAL_TABLET | Freq: Four times a day (QID) | ORAL | Status: DC | PRN

## 2012-02-13 NOTE — ED Notes (Signed)
Pt presents from Urgent Care for evaluation of L arm swelling since before discharge from [facility] on Tuesday.  Pt reports fingers to L hand are cold and swollen.  Pt had septal myomectomy performed, had multiple IV sites to both arms.  Pt had chest tube to L side that pt reports is draining. Pt reports swelling to both legs as well.

## 2012-02-13 NOTE — ED Notes (Addendum)
Pt c/o wound drainage.  Pt states she had a septal myectomy on 4/29 at [facility].  Pt also had L chest tube at later date, removed and sutures taken out on Tuesday.  Pt states drainage has been constant.  Pt denies fever.  Drainage appears clear in color tinged with slight amount of blood.  Pt also states she has sutures on L forearm that she forgot to have removed by Dermatologist.  Pt has swelling to distal L arm and hand.

## 2012-02-13 NOTE — ED Provider Notes (Signed)
History     CSN: [id number]  Arrival date & time 02/13/12  1144   First MD Initiated Contact with Patient 02/13/12 1258      Chief Complaint  Patient presents with  . Arm Swelling    (Consider location/radiation/quality/duration/timing/severity/associated sxs/prior treatment) The history is provided by the patient.  41 y.o. female w hx of hypertrophic cardiomyopathy s/p septal myomectomy and a implanted defibrillator on April 29th at [facility] pw continued drainage from left sided chest tube and left arm swelling x 2 weeks. The patient notes mild to moderate serosanguineous drainage from the left chest tube site since it was removed 2 days ago. The patient denies pain in this area. The patient states that she has had swelling of her left forearm for approximately 2 weeks. She has had an aching pain in the forearm for this. Time as well that is unchanged. The patient denies chest pain, shortness of breath, or other associated symptoms. She states that nothing has relieved her sx. The patient has never had these symptoms before.  Past Medical History  Diagnosis Date  . Secondary cardiomyopathy, unspecified     HOCM  . Anxiety state, unspecified   . Allergic rhinitis     to dogs  . Asthma     no recent flare  . History of anemia     during pregnancy  . History of chicken pox   . Generalized headaches   . Ocular migraine     no HA  . Back pain     told cracked bone, vicodin didn't help, improved with percocets/muscle relaxants, no eval by prior PCP    Past Surgical History  Procedure Date  . Cesarean section 1991  . Induced abortion 1990 and 1993  . US echocardiography 08/2011    severe LVH, severe asymmetric hypertrophy, EF 50-55%, grade 2 diastolic dysfunction    Family History  Problem Relation Age of Onset  . Diabetes Father   . Heart disease Father     unspecified heart problem  . Thyroid disease Mother   . Anemia Mother     mediterranean anemia, ITP  . Heart disease  Maternal Grandfather     CHF  . Diabetes Paternal Grandfather   . Cancer Paternal Grandfather   . Coronary artery disease Neg Hx   . Stroke Neg Hx     History  Substance Use Topics  . Smoking status: Former Smoker -- 1.0 packs/day for 3 years    Types: Cigarettes    Quit date: 09/30/1988  . Smokeless tobacco: Never Used   Comment: quitin 1990  . Alcohol Use: 0.0 oz/week     rarely drinks wine    OB History    Grav Para Term Preterm Abortions TAB SAB Ect Mult Living                  Review of Systems  Constitutional: Negative for fever and fatigue.  HENT: Negative for congestion, drooling and neck pain.   Eyes: Negative for pain.  Respiratory: Negative for cough and shortness of breath.   Cardiovascular: Negative for chest pain.  Gastrointestinal: Negative for nausea, vomiting, abdominal pain and diarrhea.  Genitourinary: Negative for dysuria and hematuria.  Musculoskeletal: Negative for back pain and gait problem.  Skin: Negative for color change.  Neurological: Negative for dizziness and headaches.  Hematological: Negative for adenopathy.  Psychiatric/Behavioral: Negative for behavioral problems.  All other systems reviewed and are negative.    Allergies  Review of patient's allergies indicates  no known allergies.  Home Medications   Current Outpatient Rx  Name Route Sig Dispense Refill  . AMIODARONE HCL 200 MG PO TABS Oral Take 200 mg by mouth daily.    . ASPIRIN 81 MG PO TABS Oral Take 81 mg by mouth daily.    . BECLOMETHASONE DIPROPIONATE 80 MCG/ACT IN AERS Inhalation Inhale 1 puff into the lungs as needed. For breathing    . CETIRIZINE HCL 10 MG PO TABS Oral Take 10 mg by mouth daily.      Marland Kitchen DABIGATRAN ETEXILATE MESYLATE 150 MG PO CAPS Oral Take 150 mg by mouth every 12 (twelve) hours.    . FUROSEMIDE 40 MG PO TABS Oral Take 40 mg by mouth daily.    Marland Kitchen GABAPENTIN 300 MG PO CAPS Oral Take 300 mg by mouth 3 (three) times daily.    Marland Kitchen METOPROLOL SUCCINATE ER  100 MG PO TB24 Oral Take 50 mg by mouth daily.    . NORETHINDRONE 0.35 MG PO TABS Oral Take 1 tablet by mouth daily.       BP 105/74  Pulse 109  Temp 98.8 F (37.1 C)  Resp 22  SpO2 98%  LMP 01/26/2012  Physical Exam  Constitutional: She is oriented to person, place, and time. She appears well-developed and well-nourished.  HENT:  Head: Normocephalic.  Mouth/Throat: No oropharyngeal exudate.  Eyes: Conjunctivae and EOM are normal. Pupils are equal, round, and reactive to light.  Neck: Normal range of motion. Neck supple.  Cardiovascular: Regular rhythm, normal heart sounds and intact distal pulses.  Exam reveals no gallop and no friction rub.   No murmur heard.      Tachycardia 105  Pulmonary/Chest: Effort normal and breath sounds normal. No respiratory distress. She has no wheezes.       2cm healing wound from previous chest tube on left lateral chest. Mild serosanguinous drainage. Wound is clean, dry w/out evidence of infection.  Abdominal: Soft. Bowel sounds are normal. There is no tenderness.  Musculoskeletal: Normal range of motion. She exhibits edema (mild edema extending from left elbow to left hand. ). She exhibits no tenderness.       Unable to palpate radial pulse in LUE, 2+ brachial pulse in LUE. Left distal arm has normal capillary refill, is warm, and well perfused.   Diffuse mild ttp of left forearm. Mild to moderate older ecchymosis on left medial/proximal elbow and right lateral elbow.   Neurological: She is alert and oriented to person, place, and time.  Skin: Skin is warm and dry.  Psychiatric: She has a normal mood and affect. Her behavior is normal.    ED Course  Procedures (including critical care time)  Labs Reviewed - No data to display No results found.   No diagnosis found.   Date: 02/13/2012  Rate: 109  Rhythm: Ectopic atrial tachycardia  QRS Axis: left  Intervals: QT prolonged  ST/T Wave abnormalities: nonspecific T wave changes and  nonspecific ST/T changes  Conduction Disutrbances:left bundle branch block  Narrative Interpretation: Suspect noted changes to be assoc w/ recent heart surgery  Old EKG Reviewed: changes noted    MDM  1:30 PM 41 y.o. female w hx of hypertrophic cardiomyopathy s/p septal myomectomy and a implanted defibrillator on April 29th at [facility] pw continued drainage from left sided chest tube and left arm swelling x 2 weeks. Pt seen at urgent care pta, Dr. Silvestre Mesi (at [facility]) contacted and would like pt evaluated in ED, would like DVT of LUE. Pt  AFVSS here, she denies cp, sob. She would not like pain medicine at this time. Currently taking prodaxa. Will get LUE [place], coags, CXR.    3:44 PM Pt found to have deep vein thrombosis in the left subclavian, axillary, proximal brachial, radial, and ulnar veins. I attempted to contact Dr. Silvestre Mesi at [facility]. I was unable to contact him and I left a message.   4:45 PM: As pt is currently taking prodaxa, do not believe inpt mgmt has more to offer. Pt would like to go home. I have discussed the diagnosis/risks/treatment options with the patient and family and believe the pt to be eligible for discharge home to follow-up with pcp tomorrow to organize appropriate consultants. We also discussed returning to the ED immediately if new or worsening sx occur. We discussed the sx which are most concerning (e.g., chest pain, sob, worsening arm pain) that necessitate immediate return. Any new prescriptions provided to the patient are listed below.  New Prescriptions   HYDROCODONE-ACETAMINOPHEN (NORCO) 5-325 MG PER TABLET    Take 1 tablet by mouth every 6 (six) hours as needed for pain.    Clinical Impression 1. Arm DVT (deep venous thromboembolism), acute, left          Purvis Sheffield, MD 02/13/12 1650

## 2012-02-13 NOTE — Progress Notes (Addendum)
*  Preliminary Results* Left upper extremity venous duplex completed. Left upper extremity is positive for deep vein thrombosis in the left subclavian, axillary, proximal brachial, radial, and ulnar veins.  02/13/2012 3:18 PM  Elpidio Galea, RDMS,RDCS

## 2012-02-13 NOTE — ED Provider Notes (Signed)
History     CSN: [id number]  Arrival date & time 02/13/12  0906   First MD Initiated Contact with Patient 02/13/12 0932      Chief Complaint  Patient presents with  . Wound Check    (Consider location/radiation/quality/duration/timing/severity/associated sxs/prior treatment) HPI Comments: Patient presents urgent care today with several complaints. She had a septal myomectomy in April 29 at [facility] (secondary to hypertrophic cardiomyopathy). Patient describes that she's been using from her lateral chest wall from the site that she had a thoracic tube they were draining a pleural effusion post surgery. She is describing multiple changes in her dressings as they continue to get wet of sanguinolent yellowish fluid. She denies any shortness of breath or fevers. Also with ongoing left upper extremity pain to the point that she can no longer move her arm consists painful and swelling around her left forearm and dorsum of the hand that has increased in size for the last few days.  She and her companion have been trying to reach her Dr. at [facility] with poor success since yesterday. They decided to come in to be checked and also to have some sutures removed from her left forearm (skin biopsy was inconclusive to reveal the source of this ecchymotic-type rash). After communicating with her Dr. at [facility] he described that the conclusion was as this was some type of embolic event as they were manipulating her peripheral vessels during upper Torri and post-operatory..  Patient describes been sore from the surgery but mainly bothered by the wound on the side of her chest and her left forearm swelling and pain. She is describing she is taking her medicines as prescribed  Patient is a 41 y.o. female presenting with wound check. The history is provided by the patient.  Wound Check  She was treated in the ED 10 to 14 days ago. Previous treatment in the ED includes laceration repair.    Past Medical History    Diagnosis Date  . Secondary cardiomyopathy, unspecified     HOCM  . Anxiety state, unspecified   . Allergic rhinitis     to dogs  . Asthma     no recent flare  . History of anemia     during pregnancy  . History of chicken pox   . Generalized headaches   . Ocular migraine     no HA  . Back pain     told cracked bone, vicodin didn't help, improved with percocets/muscle relaxants, no eval by prior PCP    Past Surgical History  Procedure Date  . Cesarean section 1991  . Induced abortion 1990 and 1993  . US echocardiography 08/2011    severe LVH, severe asymmetric hypertrophy, EF 50-55%, grade 2 diastolic dysfunction    Family History  Problem Relation Age of Onset  . Diabetes Father   . Heart disease Father     unspecified heart problem  . Thyroid disease Mother   . Anemia Mother     mediterranean anemia, ITP  . Heart disease Maternal Grandfather     CHF  . Diabetes Paternal Grandfather   . Cancer Paternal Grandfather   . Coronary artery disease Neg Hx   . Stroke Neg Hx     History  Substance Use Topics  . Smoking status: Former Smoker -- 1.0 packs/day for 3 years    Types: Cigarettes    Quit date: 09/30/1988  . Smokeless tobacco: Never Used   Comment: quitin 1990  . Alcohol Use: 0.0  oz/week     rarely drinks wine    OB History    Grav Para Term Preterm Abortions TAB SAB Ect Mult Living                  Review of Systems  Allergies  Review of patient's allergies indicates no known allergies.  Home Medications   Current Outpatient Rx  Name Route Sig Dispense Refill  . BECLOMETHASONE DIPROPIONATE 80 MCG/ACT IN AERS Inhalation Inhale 1 puff into the lungs as needed. For breathing    . CETIRIZINE HCL 10 MG PO TABS Oral Take 10 mg by mouth daily.      Marland Kitchen DILTIAZEM HCL ER COATED BEADS 120 MG PO CP24 Oral Take 1 capsule (120 mg total) by mouth daily. 30 capsule 3    Patient requests 1 pill until she can get the whol ...  . METOPROLOL SUCCINATE ER 100 MG  PO TB24 Oral Take 1 tablet (100 mg total) by mouth daily. 30 tablet 3    Patient requests 1 pill until she can get the whol ...  . NAPROXEN 500 MG PO TABS  Take one po bid x 1 week then prn pain, take with food 60 tablet 0  . NORETHINDRONE 0.35 MG PO TABS Oral Take 1 tablet by mouth daily.       BP 109/66  Pulse 109  Temp(Src) 98.9 F (37.2 C) (Oral)  Resp 20  SpO2 97%  LMP 01/27/2012  Physical Exam  ED Course  Procedures (including critical care time)  Labs Reviewed - No data to display No results found.   1. Upper arm pain       MDM  Patient is postop after a septal myomectomy and a implanted defibrillator. Presented to urgent care today with complaints related to different surgical wounds including a puncture biopsy for her left forearm and a continuous active sanguinolent exudate from her thoracocentesis surgical wound. Case was discussed with cardiovascular surgeon from [facility], he is requesting that she gets further evaluated in rule out a upper extremity DVT. Her L upper extremity, and has increased and patient has a obvious restriction in range of motion. Patient is stable and afebrile has followup schedule with cardiovascular surgeon next Thursday and patient also has a primary care Dr. in our area. I encouraged patient to see her primary care Dr. for continuity of care when she goes to her doctor postoperative for a period, as this will take a significant amount of time and it's a complicated sequence of followup visits with different providers.        Jimmie Molly, MD 02/13/12 1134

## 2012-02-13 NOTE — Telephone Encounter (Signed)
Pt s/p cardiac surgery for hypertrophic cardiomyopathy, today found to have LUE DVT.  Can we call her tomorrow to ensure she has gotten ahold of cardiac surgeon for f/u or if not offer appt for f/u here.

## 2012-02-14 NOTE — ED Provider Notes (Signed)
  I performed a history and physical examination of [name] and discussed her management with Dr. Romeo Apple.  I agree with the history, physical, assessment, and plan of care, with the following exceptions: None  I have seen all electrocardiographic studies for this patient and agree with the interpretation.  In brief this relatively young female now presents following a septal myomectomy with concerns for left upper extremity edema.  The patient is a reaching productive, and although she has new demonstration of thrombosis, there is no evidence of distress, either via vital signs or grossly.  Given the patient's compliance with her medication, as stated by Dr. Romeo Apple, and her desire for discharge, this was accommodated.  The patient was provided return precautions as well as followup instructions.  Elyse Jarvis, MD 02/14/12 1556

## 2012-02-14 NOTE — Telephone Encounter (Signed)
Left message for Dr. Silvestre Mesi at [phone]. Advised you needed to speak to him today in reference to mutual patient. Will await return call.

## 2012-02-14 NOTE — Telephone Encounter (Signed)
Spoke with patient. She has follow up with surgeon on 02-21-12. She did want to see you for a follow up as well. Scheduled for 02-17-12 as today was not convenient for patient. Advised to go to ER if any SOB,chest pain, worsening arm pain or swelling over the weekend. She verbalized understanding.

## 2012-02-14 NOTE — Telephone Encounter (Signed)
Can we get a hold of Dr. Silvestre Mesi cardiothoracic surgeon at [facility], I want to touch base with him before weekend.  Left upper extremity is positive for deep vein thrombosis in the left subclavian, axillary, proximal brachial, radial, and ulnar veins. Surgery on 4/29.

## 2012-02-17 ENCOUNTER — Ambulatory Visit (INDEPENDENT_AMBULATORY_CARE_PROVIDER_SITE_OTHER): Payer: 59 | Admitting: Family Medicine

## 2012-02-17 ENCOUNTER — Encounter: Payer: Self-pay | Admitting: Family Medicine

## 2012-02-17 VITALS — BP 100/64 | HR 106 | Temp 98.8°F | Wt 197.0 lb

## 2012-02-17 DIAGNOSIS — I82622 Acute embolism and thrombosis of deep veins of left upper extremity: Secondary | ICD-10-CM

## 2012-02-17 DIAGNOSIS — I82629 Acute embolism and thrombosis of deep veins of unspecified upper extremity: Secondary | ICD-10-CM

## 2012-02-17 DIAGNOSIS — I422 Other hypertrophic cardiomyopathy: Secondary | ICD-10-CM

## 2012-02-17 DIAGNOSIS — I4891 Unspecified atrial fibrillation: Secondary | ICD-10-CM

## 2012-02-17 MED ORDER — HYDROCODONE-ACETAMINOPHEN 5-325 MG PO TABS: 1.0000 | ORAL_TABLET | Freq: Four times a day (QID) | ORAL | Status: AC | PRN

## 2012-02-17 NOTE — Progress Notes (Signed)
Subjective:    Patient ID: [name], female    DOB: 06/13/1971, 41 y.o.   MRN: [id number]  HPI CC: f/u ER visit   41 yo pt of mine with h/o hypertrophic obstructive cardiomyopathy s/p septal myomectomy with implantable defibrillator 01/27/2012 presents for ER f/u.  She was evaluated at ER on 02/13/2012 with L upper extremity swelling.  Also with some left forearm skin reaction that is slowly decreasing in size and improving.  Initially on coumadin, with skin reaction this was discontinued and placed on pradaxa and asa.  Has been off coumadin for the last week.  H/o L pleural effusion s/p chest tube at [facility], slowly resolving, decreasing drainage.  On CXR at ER noted minimal effusion present.  Denies chest pain/tightness, wheezing.  Mild cough.  No change in sob.  No fevers/chills.  States left arm pain and swelling improving.  Denies constipation.  Appetite ok.  Denies diaphoresis or nausea. Wt Readings from Last 3 Encounters:  02/17/12 197 lb (89.359 kg)  11/25/11 196 lb 12 oz (89.245 kg)  08/30/11 194 lb 6.4 oz (88.179 kg)    Initially after surgery was placed on warfarin but worried that this caused skin reaction (which turned out negative after skin biopsy according to pt).  Next appt with Dr. Silvestre Mesi is Thursday.  To talk about switching blood thinner therapy.  At ER - left upper extremity was positive for deep vein thrombosis in the left subclavian, axillary, proximal brachial, radial, and ulnar veins.   Past Medical History  Diagnosis Date  . Secondary cardiomyopathy, unspecified     HOCM  . Anxiety state, unspecified   . Allergic rhinitis     to dogs  . Asthma     no recent flare  . History of anemia     during pregnancy  . History of chicken pox   . Generalized headaches   . Ocular migraine     no HA  . Back pain     told cracked bone, vicodin didn't help, improved with percocets/muscle relaxants, no eval by prior PCP   Past Surgical History  Procedure Date  .  Cesarean section 1991  . Induced abortion 1990 and 1993  . US echocardiography 08/2011    severe LVH, severe asymmetric hypertrophy, EF 50-55%, grade 2 diastolic dysfunction  . Septal myomectomy 01/27/2012    [facility] (Dr. Silvestre Mesi)  . Cardiac defibrillator placement 01/2012    afib    Review of Systems Per HPI    Objective:   Physical Exam  Nursing note and vitals reviewed. Constitutional: She appears well-developed and well-nourished. No distress.  HENT:  Head: Normocephalic and atraumatic.  Mouth/Throat: Oropharynx is clear and moist. No oropharyngeal exudate.  Eyes: Conjunctivae and EOM are normal. Pupils are equal, round, and reactive to light. No scleral icterus.  Neck: Normal range of motion. Neck supple.  Cardiovascular: Regular rhythm.  Tachycardia present.   Murmur heard.  Systolic murmur is present with a grade of 3/6       Midline scar from sternotomy, left chest scar from recent pacer placement  Pulmonary/Chest: Not tachypneic. No respiratory distress. She has decreased breath sounds.       Left lower lobe crackles.  Musculoskeletal:       1+ BLE pitting edema 1+ LUE pitting edema Unable to palpate radial pulse on left Sensation intact BUE  Lymphadenopathy:    She has no cervical adenopathy.  Skin: Skin is warm and dry. Rash noted.  Several ecchymoses bilateral upper extremities Anterior left forearm with erythematous seemingly necrotic skin lesion that is decreasing in size per pt.  Some depression of skin compared to surrounding normal skin      Assessment & Plan:  Tried to touch base with Dr. Silvestre Mesi at [phone], unable to reach.

## 2012-02-17 NOTE — Patient Instructions (Addendum)
Refilled hydrocodone. No other changes today. Keep appointment with Dr Silvestre Mesi this week. If worsening arm pain, spreading rash, worsening swelling, or any worsening shortness of breath or chest pain, please seek urgent care again.

## 2012-02-18 ENCOUNTER — Encounter: Payer: Self-pay | Admitting: Family Medicine

## 2012-02-18 DIAGNOSIS — I82629 Acute embolism and thrombosis of deep veins of unspecified upper extremity: Secondary | ICD-10-CM | POA: Insufficient documentation

## 2012-02-18 NOTE — Assessment & Plan Note (Signed)
Warfarin seemed to cause skin reaction. This was stopped about 1 wk ago and pradaxa started. Also on baby aspirin.  I am suspicious about coumadin skin necrosis.  As skin reaction improving slowly off coumadin, will continue to monitor this. I wonder if UE DVT occurred during transition from warfarin to pradaxa.  Currently on blood thinner, endorses decreasing pain and swelling of left arm, ok to continue meds as up to now and f/u with CVTS.

## 2012-02-18 NOTE — Assessment & Plan Note (Addendum)
S/p recent myomectomy and afterwards ICD placement. Overall seems stable. Discussed red flags to seek urgent care. Will fax my note over to Dr. Silvestre Mesi from today.

## 2012-02-18 NOTE — Assessment & Plan Note (Signed)
Sounds like occurred after surgery, leading to permanent pacemaker. No records yet available, will await. States heart rate ranging around low 100s

## 2012-02-20 ENCOUNTER — Other Ambulatory Visit: Payer: Self-pay

## 2012-02-20 MED ORDER — BECLOMETHASONE DIPROPIONATE 80 MCG/ACT IN AERS: 1.0000 | INHALATION_SPRAY | Freq: Two times a day (BID) | RESPIRATORY_TRACT | Status: DC

## 2012-02-20 NOTE — Telephone Encounter (Signed)
Pt request refill Q var # 1 inhaler x 3 to [facility].Patient notified as instructed by telephone.

## 2012-02-21 ENCOUNTER — Telehealth: Payer: Self-pay | Admitting: Family Medicine

## 2012-02-21 NOTE — Telephone Encounter (Signed)
Noted. Thanks. Ok to just take these then restart taking meds when next due.

## 2012-02-21 NOTE — Telephone Encounter (Signed)
I would have her try to take meds that she saw in emesis if stomach is feeling better. How was appt with cardiothoracic surgery yesterday?  Any changes to meds?

## 2012-02-21 NOTE — Telephone Encounter (Signed)
Caller: Kyli/Patient; PCP: Eustaquio Boyden; Call regarding Vomiting After Taking Meds this morning- 02/21/12. Heart Surgery on 01/27/12. She saw pills in vomit and is wondering if she needs to redose. Took Pradaxa, Amiodarone, Lyrica, Baby ASA, Topamax, and Lasix; She drank Ensure and ate Spinach and Eggs Prior to taking meds. Triage and Care advice per Nausea and Vomiting Protocol and advised to wait 10 min and then try to eat some starchy foods. PLEASE ASK DR. GUTIERREZ IF SHE NEEDS TO TAKE MEDS AGAIN IF SHE HOLDS DOWN FOODS. SHE IS NOT FEELING NAUSEOUS OR HAVING ANY ABDOMINAL PAIN. Voiding QS, Afebrile. CB#: [phone];

## 2012-02-21 NOTE — Telephone Encounter (Signed)
Spoke with patient. She said she could only ID the Lyrica and ASA. She was afraid to retake any of the other meds since she couldn't see them. She said appt went fine. Staples were removed and MD told her to expect at least 1 year to fully recover. He suggested she apply for disability since she is 100% disabled right now. He didn't change any of her meds and should be sending you her office notes.

## 2012-02-25 ENCOUNTER — Telehealth: Payer: Self-pay

## 2012-02-25 MED ORDER — METOPROLOL SUCCINATE ER 50 MG PO TB24: 50.0000 mg | ORAL_TABLET | Freq: Every day | ORAL | Status: DC

## 2012-02-25 NOTE — Telephone Encounter (Signed)
Sent in, 1 mo supply at a time.  If pt desires 90d supply, plz change.  plz notify pt.

## 2012-02-25 NOTE — Telephone Encounter (Signed)
Pt request Metoprolol 100 mg changed to 50 mg.Pt taking metoprolol 100 mg taking 1/2 tab daily.[facility] [address]

## 2012-02-25 NOTE — Telephone Encounter (Signed)
Message left advising patient.  

## 2012-02-27 ENCOUNTER — Encounter: Payer: Self-pay | Admitting: Family Medicine

## 2012-02-28 ENCOUNTER — Ambulatory Visit: Payer: 59 | Admitting: Cardiovascular Disease

## 2012-03-02 ENCOUNTER — Encounter (HOSPITAL_COMMUNITY): Payer: Self-pay | Admitting: *Deleted

## 2012-03-02 ENCOUNTER — Inpatient Hospital Stay (HOSPITAL_COMMUNITY)
Admission: EM | Admit: 2012-03-02 | Discharge: 2012-03-05 | DRG: 314 | Disposition: A | Discharge status: Home / Self Care | Payer: 59 | Source: Ambulatory Visit | Attending: Cardiovascular Disease | Admitting: Cardiovascular Disease

## 2012-03-02 ENCOUNTER — Emergency Department (HOSPITAL_COMMUNITY): Payer: 59

## 2012-03-02 ENCOUNTER — Inpatient Hospital Stay (HOSPITAL_COMMUNITY): Payer: 59

## 2012-03-02 DIAGNOSIS — I472 Ventricular tachycardia, unspecified: Secondary | ICD-10-CM | POA: Diagnosis present

## 2012-03-02 DIAGNOSIS — Z86718 Personal history of other venous thrombosis and embolism: Secondary | ICD-10-CM

## 2012-03-02 DIAGNOSIS — I319 Disease of pericardium, unspecified: Principal | ICD-10-CM | POA: Diagnosis present

## 2012-03-02 DIAGNOSIS — I10 Essential (primary) hypertension: Secondary | ICD-10-CM | POA: Diagnosis present

## 2012-03-02 DIAGNOSIS — I313 Pericardial effusion (noninflammatory): Secondary | ICD-10-CM

## 2012-03-02 DIAGNOSIS — I4891 Unspecified atrial fibrillation: Secondary | ICD-10-CM | POA: Diagnosis present

## 2012-03-02 DIAGNOSIS — I429 Cardiomyopathy, unspecified: Secondary | ICD-10-CM

## 2012-03-02 DIAGNOSIS — Z833 Family history of diabetes mellitus: Secondary | ICD-10-CM

## 2012-03-02 DIAGNOSIS — E876 Hypokalemia: Secondary | ICD-10-CM | POA: Diagnosis present

## 2012-03-02 DIAGNOSIS — R0602 Shortness of breath: Secondary | ICD-10-CM

## 2012-03-02 DIAGNOSIS — I82622 Acute embolism and thrombosis of deep veins of left upper extremity: Secondary | ICD-10-CM

## 2012-03-02 DIAGNOSIS — I422 Other hypertrophic cardiomyopathy: Secondary | ICD-10-CM | POA: Diagnosis present

## 2012-03-02 DIAGNOSIS — Z823 Family history of stroke: Secondary | ICD-10-CM

## 2012-03-02 DIAGNOSIS — I5043 Acute on chronic combined systolic (congestive) and diastolic (congestive) heart failure: Secondary | ICD-10-CM

## 2012-03-02 DIAGNOSIS — I82629 Acute embolism and thrombosis of deep veins of unspecified upper extremity: Secondary | ICD-10-CM | POA: Diagnosis present

## 2012-03-02 DIAGNOSIS — I509 Heart failure, unspecified: Secondary | ICD-10-CM | POA: Diagnosis present

## 2012-03-02 DIAGNOSIS — Z87891 Personal history of nicotine dependence: Secondary | ICD-10-CM

## 2012-03-02 DIAGNOSIS — Z8249 Family history of ischemic heart disease and other diseases of the circulatory system: Secondary | ICD-10-CM

## 2012-03-02 DIAGNOSIS — I428 Other cardiomyopathies: Secondary | ICD-10-CM

## 2012-03-02 DIAGNOSIS — Z95810 Presence of automatic (implantable) cardiac defibrillator: Secondary | ICD-10-CM

## 2012-03-02 DIAGNOSIS — J45909 Unspecified asthma, uncomplicated: Secondary | ICD-10-CM | POA: Diagnosis present

## 2012-03-02 DIAGNOSIS — I4729 Other ventricular tachycardia: Secondary | ICD-10-CM | POA: Diagnosis present

## 2012-03-02 DIAGNOSIS — I9789 Other postprocedural complications and disorders of the circulatory system, not elsewhere classified: Secondary | ICD-10-CM

## 2012-03-02 HISTORY — DX: Acute embolism and thrombosis of unspecified deep veins of unspecified lower extremity: I82.409

## 2012-03-02 HISTORY — DX: Pericardial effusion (noninflammatory): I31.3

## 2012-03-02 HISTORY — DX: Other hypertrophic cardiomyopathy: I42.2

## 2012-03-02 HISTORY — DX: Hypotension, unspecified: I95.9

## 2012-03-02 HISTORY — DX: Other pericardial effusion (noninflammatory): I31.39

## 2012-03-02 HISTORY — DX: Unspecified atrial fibrillation: I48.91

## 2012-03-02 LAB — CARDIAC PANEL(CRET KIN+CKTOT+MB+TROPI)
CK, MB: 11.5 ng/mL (ref 0.3–4.0)
CK, MB: 9.9 ng/mL (ref 0.3–4.0)
Relative Index: 11.4 — ABNORMAL HIGH (ref 0.0–2.5)
Troponin I: 0.45 ng/mL (ref <0.30)
Troponin I: 0.4 ng/mL (ref <0.30)

## 2012-03-02 LAB — COMPREHENSIVE METABOLIC PANEL
ALT: 23 U/L (ref 0–35)
Alkaline Phosphatase: 80 U/L (ref 39–117)
CO2: 26 mEq/L (ref 19–32)
Calcium: 9.7 mg/dL (ref 8.4–10.5)
GFR calc Af Amer: >90 mL/min (ref >90)
GFR calc non Af Amer: >90 mL/min (ref >90)
Glucose, Bld: 116 mg/dL — ABNORMAL HIGH (ref 70–99)
Potassium: 3.2 mEq/L — ABNORMAL LOW (ref 3.5–5.1)
Sodium: 136 mEq/L (ref 135–145)

## 2012-03-02 LAB — HEPATIC FUNCTION PANEL
Albumin: 3.7 g/dL (ref 3.5–5.2)
Alkaline Phosphatase: 73 U/L (ref 39–117)
Indirect Bilirubin: 0.8 mg/dL (ref 0.3–0.9)
Total Protein: 8.3 g/dL (ref 6.0–8.3)

## 2012-03-02 LAB — DIFFERENTIAL
Eosinophils Relative: 2 % (ref 0–5)
Lymphocytes Relative: 15 % (ref 12–46)
Lymphs Abs: 1.2 10*3/uL (ref 0.7–4.0)

## 2012-03-02 LAB — CBC
Hemoglobin: 11.9 g/dL — ABNORMAL LOW (ref 12.0–15.0)
MCV: 83.2 fL (ref 78.0–100.0)
Platelets: 346 10*3/uL (ref 150–400)
RBC: 4.59 MIL/uL (ref 3.87–5.11)
WBC: 7.7 10*3/uL (ref 4.0–10.5)

## 2012-03-02 MED ORDER — NITROGLYCERIN 0.4 MG SL SUBL: 0.4000 mg | SUBLINGUAL_TABLET | SUBLINGUAL | Status: DC | PRN

## 2012-03-02 MED ORDER — FUROSEMIDE 40 MG PO TABS
40.0000 mg | ORAL_TABLET | Freq: Every day | ORAL | Status: DC
  Administered 2012-03-03 – 2012-03-05 (×3): 40 mg via ORAL
  Filled 2012-03-02 (×3): qty 1

## 2012-03-02 MED ORDER — SODIUM CHLORIDE 0.9 % IV SOLN: 250.0000 mL | INTRAVENOUS | Status: DC | PRN

## 2012-03-02 MED ORDER — DABIGATRAN ETEXILATE MESYLATE 150 MG PO CAPS
150.0000 mg | ORAL_CAPSULE | Freq: Two times a day (BID) | ORAL | Status: DC
  Administered 2012-03-02 – 2012-03-05 (×6): 150 mg via ORAL
  Filled 2012-03-02 (×11): qty 1

## 2012-03-02 MED ORDER — FUROSEMIDE 10 MG/ML IJ SOLN
80.0000 mg | Freq: Once | INTRAMUSCULAR | Status: AC
  Administered 2012-03-02: 80 mg via INTRAVENOUS
  Filled 2012-03-02: qty 8

## 2012-03-02 MED ORDER — COLCHICINE 0.6 MG PO TABS
0.6000 mg | ORAL_TABLET | Freq: Two times a day (BID) | ORAL | Status: DC
  Administered 2012-03-02 – 2012-03-05 (×6): 0.6 mg via ORAL
  Filled 2012-03-02 (×8): qty 1

## 2012-03-02 MED ORDER — ACETAMINOPHEN 325 MG PO TABS
650.0000 mg | ORAL_TABLET | ORAL | Status: DC | PRN
  Administered 2012-03-03: 650 mg via ORAL
  Filled 2012-03-02: qty 2

## 2012-03-02 MED ORDER — ALPRAZOLAM 0.25 MG PO TABS
0.2500 mg | ORAL_TABLET | Freq: Two times a day (BID) | ORAL | Status: DC | PRN
  Administered 2012-03-04: 0.25 mg via ORAL
  Filled 2012-03-02: qty 1

## 2012-03-02 MED ORDER — ONDANSETRON HCL 4 MG/2ML IJ SOLN
4.0000 mg | Freq: Four times a day (QID) | INTRAMUSCULAR | Status: DC | PRN
  Administered 2012-03-04 (×2): 4 mg via INTRAVENOUS
  Filled 2012-03-02 (×2): qty 2

## 2012-03-02 MED ORDER — FLUTICASONE PROPIONATE HFA 44 MCG/ACT IN AERO
1.0000 | INHALATION_SPRAY | Freq: Two times a day (BID) | RESPIRATORY_TRACT | Status: DC
  Administered 2012-03-02 – 2012-03-05 (×5): 1 via RESPIRATORY_TRACT
  Filled 2012-03-02 (×3): qty 10.6

## 2012-03-02 MED ORDER — METOPROLOL SUCCINATE ER 50 MG PO TB24
50.0000 mg | ORAL_TABLET | Freq: Every day | ORAL | Status: DC
  Administered 2012-03-03 – 2012-03-05 (×3): 50 mg via ORAL
  Filled 2012-03-02 (×3): qty 1

## 2012-03-02 MED ORDER — LORAZEPAM 1 MG PO TABS
1.0000 mg | ORAL_TABLET | Freq: Once | ORAL | Status: AC
  Administered 2012-03-02: 1 mg via ORAL
  Filled 2012-03-02: qty 1

## 2012-03-02 MED ORDER — POTASSIUM CHLORIDE CRYS ER 20 MEQ PO TBCR
40.0000 meq | EXTENDED_RELEASE_TABLET | Freq: Once | ORAL | Status: AC
  Administered 2012-03-02: 40 meq via ORAL
  Filled 2012-03-02: qty 2

## 2012-03-02 MED ORDER — LORATADINE 10 MG PO TABS
10.0000 mg | ORAL_TABLET | Freq: Every day | ORAL | Status: DC
  Administered 2012-03-04: 10 mg via ORAL
  Filled 2012-03-02 (×3): qty 1

## 2012-03-02 MED ORDER — SODIUM CHLORIDE 0.9 % IJ SOLN: 3.0000 mL | INTRAMUSCULAR | Status: DC | PRN

## 2012-03-02 MED ORDER — AMIODARONE HCL 200 MG PO TABS
200.0000 mg | ORAL_TABLET | Freq: Every day | ORAL | Status: DC
  Administered 2012-03-03: 200 mg via ORAL
  Filled 2012-03-02 (×2): qty 1

## 2012-03-02 MED ORDER — SODIUM CHLORIDE 0.9 % IJ SOLN
3.0000 mL | Freq: Two times a day (BID) | INTRAMUSCULAR | Status: DC
  Administered 2012-03-02 – 2012-03-05 (×7): 3 mL via INTRAVENOUS

## 2012-03-02 MED ORDER — IOHEXOL 350 MG/ML SOLN
80.0000 mL | Freq: Once | INTRAVENOUS | Status: AC | PRN
  Administered 2012-03-02: 80 mL via INTRAVENOUS

## 2012-03-02 MED ORDER — POTASSIUM CHLORIDE CRYS ER 20 MEQ PO TBCR
40.0000 meq | EXTENDED_RELEASE_TABLET | ORAL | Status: AC
  Administered 2012-03-02: 40 meq via ORAL
  Filled 2012-03-02: qty 2

## 2012-03-02 MED ORDER — ASPIRIN 81 MG PO CHEW
81.0000 mg | CHEWABLE_TABLET | Freq: Every day | ORAL | Status: DC
  Administered 2012-03-03 – 2012-03-05 (×3): 81 mg via ORAL
  Filled 2012-03-02 (×3): qty 1

## 2012-03-02 NOTE — Progress Notes (Signed)
CRITICAL VALUE ALERT  Critical value received:  CKMB 11.5, trop 0.45  Date of notification:  03/02/2012  Time of notification:  1613  Critical value read back:yes  Nurse who received alert:  Roselie Awkward, RN  MD notified (1st page):  Ronie Spies PA  Time of first page:  1616  MD notified (2nd page):  Time of second page:  Responding MD:  Ronie Spies PA  Time MD responded:  [phone]

## 2012-03-02 NOTE — ED Notes (Signed)
Attempt to call report.  RN reports that she is at lunch and will be done in 5-10 minutes.  Asked to speak to charge RN and was asked to wait to speak with receiving RN.

## 2012-03-02 NOTE — Progress Notes (Signed)
*  PRELIMINARY RESULTS* Echocardiogram 2D Echocardiogram has been performed.  [name] 03/02/2012, 2:10 PM

## 2012-03-02 NOTE — ED Provider Notes (Signed)
History     CSN: [id number]  Arrival date & time 03/02/12  4098   First MD Initiated Contact with Patient 03/02/12 [phone]      No chief complaint on file.   HPI The patient presents with concerns of dyspnea and pain at her defibrillator site.  She has a notable history of cardiac surgery one month ago, septal revision and mitral valve repair.  Unit on the patient also was diagnosed with a DVT in her left upper extremity.  Since that time she has been taking her Protonix and aspirin as directed.  She notes over the past week she has had increasing dyspnea, decreased exercise capacity, and increasing pain in her left superior lateral chest wall.  She notes that she has not taken any Lasix until today, and there are no clear alleviating or exacerbating factors.  She denies any other chest pain, syncope, fever, chills, cough. She states that the swelling that was present which was diagnosed with her DVT has subsided entirely, and she denies any new pleuritic pain. Past Medical History  Diagnosis Date  . Secondary cardiomyopathy, unspecified     HOCM s/p surgery  . Anxiety state, unspecified   . Allergic rhinitis     to dogs  . Asthma     no recent flare  . History of anemia     during pregnancy  . History of chicken pox   . Generalized headaches   . Ocular migraine     no HA  . Back pain     told cracked bone, vicodin didn't help, improved with percocets/muscle relaxants, no eval by prior PCP  . 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 CHF (congestive heart failure)     Past Surgical History  Procedure Date  . Cesarean section 1991  . Induced abortion 1990 and 1993  . US echocardiography 08/2011    severe LVH, severe asymmetric hypertrophy, EF 50-55%, grade 2 diastolic dysfunction  . Cardiac surgery 01/27/2012    median sternotomy, septal myectomy, MVR - [facility] (Dr. Silvestre Mesi)  . Cardiac defibrillator placement 01/2012    afib     Family History  Problem Relation Age of Onset  . Diabetes Father   . Heart disease Father     unspecified heart problem  . Thyroid disease Mother   . Anemia Mother     mediterranean anemia, ITP  . Heart disease Maternal Grandfather     CHF  . Diabetes Paternal Grandfather   . Cancer Paternal Grandfather   . Coronary artery disease Neg Hx   . Stroke Neg Hx     History  Substance Use Topics  . Smoking status: Former Smoker -- 1.0 packs/day for 3 years    Types: Cigarettes    Quit date: 09/30/1988  . Smokeless tobacco: Never Used   Comment: quitin 1990  . Alcohol Use: 0.0 oz/week     rarely drinks wine    OB History    Grav Para Term Preterm Abortions TAB SAB Ect Mult Living                  Review of Systems  Constitutional:       HPI  HENT:       HPI otherwise negative  Eyes: Negative.   Respiratory:       HPI, otherwise negative  Cardiovascular:       HPI, otherwise nmegative  Gastrointestinal: Negative for vomiting.  Genitourinary:       HPI, otherwise negative  Musculoskeletal:       HPI, otherwise negative  Skin: Negative.   Neurological: Negative for syncope.    Allergies  Review of patient's allergies indicates no known allergies.  Home Medications   Current Outpatient Rx  Name Route Sig Dispense Refill  . AMIODARONE HCL 200 MG PO TABS Oral Take 200 mg by mouth daily.    . ASPIRIN 81 MG PO CHEW Oral Chew 81 mg by mouth daily.    . BECLOMETHASONE DIPROPIONATE 80 MCG/ACT IN AERS Inhalation Inhale 1 puff into the lungs 2 (two) times daily.    Marland Kitchen CETIRIZINE HCL 10 MG PO TABS Oral Take 10 mg by mouth daily.      Marland Kitchen DABIGATRAN ETEXILATE MESYLATE 150 MG PO CAPS Oral Take 150 mg by mouth every 12 (twelve) hours.    . FUROSEMIDE 40 MG PO TABS Oral Take 40 mg by mouth daily.    Marland Kitchen METOPROLOL SUCCINATE ER 50 MG PO TB24 Oral Take 50 mg by mouth daily. Take with or immediately following a meal.      BP 116/69  Pulse 107  Temp 98.2 F (36.8 C)  Resp  18  SpO2 97%  LMP 01/26/2012  Physical Exam  Constitutional: She is oriented to person, place, and time. She appears well-developed and well-nourished.  HENT:  Head: Normocephalic.  Mouth/Throat: No oropharyngeal exudate.  Eyes: Conjunctivae and EOM are normal. Pupils are equal, round, and reactive to light.  Neck: Normal range of motion. Neck supple.  Cardiovascular: Regular rhythm, normal heart sounds and intact distal pulses.  Exam reveals no gallop and no friction rub.   No murmur heard.      Tachycardia 105  Pulmonary/Chest: Breath sounds normal. Tachypnea noted. No respiratory distress. She has no decreased breath sounds. She has no wheezes.       L defibrillator wound is clean, dry w/out evidence of infection.  Anterior midline vertical scar is c/d/i  Abdominal: Soft. Bowel sounds are normal. There is no tenderness.  Musculoskeletal: Normal range of motion. She exhibits no edema and no tenderness.       Minimal edema of L UE, healing superficial wounds  Neurological: She is alert and oriented to person, place, and time.  Skin: Skin is warm and dry.  Psychiatric: She has a normal mood and affect. Her behavior is normal.    ED Course  Procedures (including critical care time)   Labs Reviewed  CBC  DIFFERENTIAL  COMPREHENSIVE METABOLIC PANEL  TROPONIN I  PRO B NATRIURETIC PEPTIDE   No results found.   No diagnosis found.  Cardiac 110 sinus tach abnormal Pulse oximetry 97% room air normal  Date: 03/02/2012  Rate: 101  Rhythm: sinus tachycardia  QRS Axis: left  Intervals: QT prolonged  ST/T Wave abnormalities: nonspecific ST/T changes  Conduction Disutrbances:left bundle branch block  Narrative Interpretation:   Old EKG Reviewed: unchanged ABNORMAL - no sig changes from 02/13/12   MDM  This young female with a notable recent cardiac surgery history now presents with ongoing dyspnea and pain.  On exam she is in no distress though she is tachypneic,  tachycardic.  The patient is compliant with all medications, and is awake, alert, oriented, but with her ongoing dyspnea, tachycardia, tachypnea is concern for ongoing infection versus ischemia.  The patient's ECG is abnormal, but unchanged.  The patient's labs notable for troponin of 0.4 and an elevated BNP.  The patient's x-rays demonstrate  cardiomegaly with out overt congestion.  Given these findings and the patient did receive a dose of Lasix, analgesics.  She was admitted to the cardiology service for further evaluation and management.  Gerhard Munch, MD 03/02/12 1415

## 2012-03-02 NOTE — ED Notes (Signed)
IV team paged.  

## 2012-03-02 NOTE — ED Notes (Addendum)
IV team called back.  On their way.

## 2012-03-02 NOTE — ED Notes (Addendum)
Pt. Reports feeling weak and SOB with decreased mobility on the right side x 5 days.  Pt states "feels like fluid on my chest, but it's not coming from my throat or nose drainage. It feels like it's coming from the side".  Pt. Has hxt of asthma, reports using her inhaler today with "some relief". Pt. Reports pain at defibrillator site. States " feels like pulling at defibrillator site. The pain is constant 24/7". Pt. Had defibrillator placed at [facility] in May. Septal surgery also in early May. Pt. states "I feel better when I stand up".  Pt. Has a hxt of A. Fib.  Pt's SOB increases significantly while speaking.

## 2012-03-02 NOTE — ED Notes (Signed)
Pt is having pain around left chest defibrillator site.  PT concerned of having a lot of fluid and has to continue to swallow.  Pt just had OHS in April and had septal surgery.  Pt complains of back hurting and wheezing at nite

## 2012-03-02 NOTE — Progress Notes (Signed)
CT scan results and echo reviewed by Dr. Eden Emms - no PE but moderate-large pericardial effusion, EF depressed from prior, ICD leads placement noted. Per that discussion, will make no change to current treatment plan and will see how she does clinically with the Lasix she was given in ER. He will also be discussing results with colleagues. Further recs to follow. Raliegh Scobie PA-C

## 2012-03-02 NOTE — ED Notes (Signed)
IV team at bedside 

## 2012-03-02 NOTE — H&P (Signed)
History and Physical  Patient ID: [name] MRN: [id number], DOB: 07/27/1971 Date of Encounter: 03/02/2012, 1:03 PM Primary Physician: Eustaquio Boyden, MD, MD Primary Cardiologist: Dr. Excell Seltzer, Dr. Edison Simon  Chief Complaint: SOB  HPI: 41 y/o F with hx of HOCM s/p recent septal myomectomy/MV repair at [facility] 01/27/12, recent LUE DVT 01/2012, hx of hyoptension presented to [facility] ER for evaluation of CP. She tells me she also "fibrillated" during her post-op course and believes it to be atrial fibrillation, but required shock x 1. She underwent subsequent ICD plantation 02/04/12 (1 week post-myomectomy) so details aren't clear. She has since been on amiodarone. Her metoprolol was decreased to 50mg  daily and diltiazem was discontinued at discharge. She was first diagnosed with acardiac murmur when she was 41 years old, but apparently was not diagnosed with hypertrophic cardiomyopathy until her first pregnancy several years later. She was placed on Coumadin post-cardiac surgery but apparently developed a necrotic skin reaction so was changed to Pradaxa. She apparently also had a stress test done at [facility] in 11/2011 pre-operatively that was reportedly okay per pt.   She returned to the ER c/o SOB. She was walking yesterday at the park and had to stop halfway which is unusual for her. She is having to stop and take a breath when she talks too. She reports a sensation of "fluid" up to her throat. She also feels as though her back is burning. She has had nonproductive cough particularly worse at night. The patient and her boyfriend report her LEE is much less than it usually is. Her ICD implantation site has been somewhat tender but no erythema or suppuration. No chest pain, syncope, or palpitations. She is not sure if she has had orthopnea because she was instructed to maintain the chair position per CVTS. Labs pertinent for ?pBNP 2629, ?troponin . 0.41, ?Hgb 11.9, ?K 3.2. CXR suggested cardiomegaly with low  lung volumes without acute findings. She was given 80mg  of IV Lasix by the ER at 11:55am. She currently feels the same as on presentation.  Past Medical History  Diagnosis Date  . Hypertrophic cardiomyopathy     a) heart murmur age 68 but HOCM was eventually diagnosed during pregnancy. b) s/p septal myomectomy with implantable defibrillator 01/27/2012   . Anxiety state, unspecified   . Allergic rhinitis     to dogs  . Asthma   . History of anemia     during pregnancy  . History of chicken pox   . Generalized headaches   . Ocular migraine     no HA  . Back pain     told cracked bone, vicodin didn't help, improved with percocets/muscle relaxants, no eval by prior PCP  . 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 DVT (deep venous thrombosis)     Diagnosed 02/13/12  - was placed on Warfarin after cardiac surgery but had skin reaction to warfarin so was changed to Pradaxa  . Hypotension   . Atrial fibrillation   . ICD (implantable cardiac defibrillator) in place     02/04/2012, [facility]     Most Recent Cardiac Studies: 2D Echo 08/2011 Study Conclusions   - Left ventricle: The cavity size was normal. Wall thickness was increased in a pattern of severe LVH. There was severe asymmetric hypertrophy. Systolic function was normal. The estimated ejection fraction was in the range of 50% to 55%. Wall motion was normal; there were  no regional wall motion abnormalities. Features are consistent with a pseudonormal left ventricular filling pattern, with concomitant abnormal relaxation and increased filling pressure (grade 2 diastolic dysfunction). Doppler parameters are consistent with high ventricular filling pressure. - Aortic valve: Trivial regurgitation. - Mitral valve: Calcified annulus. Mildly thickened leaflets . There was moderate systolic anterior motion of the anterior leaflet and posterior leaflet. Mild regurgitation. - Left atrium:  The atrium was moderately dilated. - Pulmonary arteries: Systolic pressure was mildly increased. - Pericardium, extracardiac: A small pericardial effusion was identified. Impressions: - Severe asymmetric LVH with SAM; LVOT gradient (peak velocity of 4.1 m/s and mean gradient of 38 mmHg); findings c/w HOCM.    Surgical History:  Past Surgical History  Procedure Date  . Cesarean section 1991  . Induced abortion 1990 and 1993  . US echocardiography 08/2011    severe LVH, severe asymmetric hypertrophy, EF 50-55%, grade 2 diastolic dysfunction  . Cardiac surgery 01/27/2012    median sternotomy, septal myectomy, MVR - [facility] (Dr. Silvestre Mesi)  . Cardiac defibrillator placement 01/2012    afib     Home Meds: Prior to Admission medications   Medication Sig Start Date End Date Taking? Authorizing Provider  amiodarone (PACERONE) 200 MG tablet Take 200 mg by mouth daily.   Yes Historical Provider, MD  aspirin 81 MG chewable tablet Chew 81 mg by mouth daily.   Yes Historical Provider, MD  beclomethasone (QVAR) 80 MCG/ACT inhaler Inhale 1 puff into the lungs 2 (two) times daily.   Yes Historical Provider, MD  cetirizine (ZYRTEC) 10 MG tablet Take 10 mg by mouth daily.     Yes Historical Provider, MD  dabigatran (PRADAXA) 150 MG CAPS Take 150 mg by mouth every 12 (twelve) hours.   Yes Historical Provider, MD  furosemide (LASIX) 40 MG tablet Take 40 mg by mouth daily.   Yes Historical Provider, MD  metoprolol succinate (TOPROL-XL) 50 MG 24 hr tablet Take 50 mg by mouth daily. Take with or immediately following a meal.   Yes Historical Provider, MD    Allergies:  Allergies  Allergen Reactions  . Warfarin And Related     Skin reaction    History   Social History  . Marital Status: Single    Spouse Name: N/A    Number of Children: 1  . Years of Education: N/A   Occupational History  . Naval architect    Social History Main Topics  . Smoking status: Former Smoker -- 1.0 packs/day for 3 years     Types: Cigarettes    Quit date: 09/30/1988  . Smokeless tobacco: Never Used   Comment: quitin 1990  . Alcohol Use: 0.0 oz/week     rarely drinks wine  . Drug Use: No     quiti n 1989  . Sexually Active: Not on file   Other Topics Concern  . Not on file   Social History Narrative   Caffeine: 1 cup coffee/dayDivorced, 1 child.  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 Diabetes Father   . Heart disease Father     unspecified heart problem  . Thyroid disease Mother   . Anemia Mother     mediterranean anemia, ITP  . Heart disease Maternal Grandfather     CHF  . Diabetes Paternal Grandfather   . Cancer Paternal Grandfather   . Coronary artery disease Neg  Hx   . Stroke Neg Hx   . Sudden death Neg Hx     Review of Systems: General: negative for chills, fever, night sweats.  Cardiovascular:see above Dermatological: negative for rash Respiratory: negative for wheezing. +cough Urologic: negative for hematuria Abdominal: negative for vomiting, diarrhea, bright red blood per rectum, melena, or hematemesis. Occasional nausea when she takes her meds on empty stomach Neurologic: negative for visual changes, syncope, or dizziness All other systems reviewed and are otherwise negative except as noted above.  Labs:   Lab Results  Component Value Date   WBC 7.7 03/02/2012   HGB 11.9* 03/02/2012   HCT 38.2 03/02/2012   MCV 83.2 03/02/2012   PLT 346 03/02/2012     Lab 03/02/12 0944  NA 136  K 3.2*  CL 95*  CO2 26  BUN 15  CREATININE 0.59  CALCIUM 9.7  PROT 8.8*  BILITOT 1.0  ALKPHOS 80  ALT 23  AST 42*  GLUCOSE 116*    Basename 03/02/12 0945  CKTOTAL --  CKMB --  TROPONINI 0.41*    Radiology/Studies:  1.  Chest 2 View 03/02/2012  *RADIOLOGY REPORT*  Clinical Data: Shortness of breath with back discomfort.  Recent  defibrillator placement.  Asthma.  Bronchitis.  CHEST - 2 VIEW  Comparison: 02/13/2012  Findings: Pacer / AICD device.  Leads right atrium right ventricle. No lead discontinuity.  There are also pacer wires projecting over the right side heart which are likely  percutaneous.  Cardiomegaly. No pleural effusion or pneumothorax.  No congestive failure.  Low lung volumes. No lobar consolidation.  IMPRESSION: Cardiomegaly and low lung volumes. No acute findings.  Original Report Authenticated By: Consuello Bossier, M.D.   EKG: sinus tach101bpm LBBB wide QRS 192bpm similar appearing to 02/13/12 EKG although QRS in 08/2011 was 130  Physical Exam: Blood pressure 100/74, pulse 93, temperature 98.2 F (36.8 C), resp. rate 33, last menstrual period 01/26/2012, SpO2 99.00%. General: Well developed, well nourished WF slightly tachypnic but no acute distress Head: Normocephalic, atraumatic, sclera non-icteric, no xanthomas, nares are without discharge.  Neck: Negative for carotid bruits. JVD not elevated. Lungs: Clear bilaterally to auscultation without wheezes, rales, or rhonchi. Breathing becomes more labored with longer sentences, has to stop to take a breath in between 6-7 words. Heart: RRR with S1 S2 with 2/6 SEM. No rubs or gallops appreciated. Defib site still has steristrips in place. No erythema or suppuration. Abdomen: Soft, non-tender, non-distended with normoactive bowel sounds. No hepatomegaly. No rebound/guarding. No obvious abdominal masses. Msk:  Strength and tone appear normal for age. Extremities: No clubbing or cyanosis. Tr pedal edema bilaterally.  Distal pedal pulses are 2+ and equal bilaterally. Has an eschar-appearing black lesion L forearm. Neuro: Alert and oriented X 3. Moves all extremities spontaneously. Psych:  Responds to questions appropriately with a normal affect.    ASSESSMENT AND PLAN:   1. Dyspnea with elevated pBNP, troponin 2. HOCM s/p septal myomectomy/MV repair 12/2011 3.  ICD implantation 02/04/2012 (1 week post-myomectomy) 4. Post-op atrial fibrillation but unclear if she had ventricular arrhythmia as well because patient & boyfriend relay correlation between ICD and "stabilizing her fibrillation" 5. Recent DVT 02/13/12 6. Hypokalemia - will give KCl now and repeat dose in 2 hours.  Etiology of dyspnea is not entirely clear. Doubt ACS with recent normal stress test but will cycle enzymes. She is not clearly volume overloaded. Will have to trend her clinical status with the IV Lasix she received in ED.  ICD has been interrogated - per preliminary report, device is functioning normally without any events or arrhythmia. Will obtain stat echo to eval myocardium and valvular structures. Will also obtain stat CT angio to rule out PE given recent DVT. Continue Pradaxa. Per Dr. Eden Emms, consider repeat LUE duplex to see if residual clot is present. Order to request records from [facility] has been placed. See below for additional thoughts.  Signed, Karen Spies PA-C 03/02/2012, 1:03 PM  Patient examined chart reviewed.  Complicated post op course with new LBBB, ? Indication for AICD and RUE DVT.  Cough and dyspnea.  Reviewed AICD interogation with tech. Just sinus tach with no events.  Has SEM but not bad.  No evidence of VSD murmur.  Concern with dyspnea and no acute finding on CXR of PE with RUE DVT Patient indicates taking Pradaxa and being compliant.  Stat echo to assess residual LVOT gradient, degree of MR, EF and R/O any other complication like pericardial effusion, VSD or SBE.    Aviya Jarvie 1:25 PM 03/02/2012

## 2012-03-02 NOTE — ED Notes (Signed)
ECO at bedside

## 2012-03-03 ENCOUNTER — Inpatient Hospital Stay (HOSPITAL_COMMUNITY): Payer: 59

## 2012-03-03 LAB — CBC
Hemoglobin: 11.1 g/dL — ABNORMAL LOW (ref 12.0–15.0)
MCH: 26.2 pg (ref 26.0–34.0)
MCV: 84.4 fL (ref 78.0–100.0)
RBC: 4.24 MIL/uL (ref 3.87–5.11)

## 2012-03-03 LAB — BASIC METABOLIC PANEL
CO2: 26 mEq/L (ref 19–32)
Calcium: 9.3 mg/dL (ref 8.4–10.5)
Chloride: 97 mEq/L (ref 96–112)
Creatinine, Ser: 0.71 mg/dL (ref 0.50–1.10)
Glucose, Bld: 121 mg/dL — ABNORMAL HIGH (ref 70–99)
Sodium: 135 mEq/L (ref 135–145)

## 2012-03-03 LAB — TSH: TSH: 8.293 u[IU]/mL — ABNORMAL HIGH (ref 0.350–4.500)

## 2012-03-03 LAB — CARDIAC PANEL(CRET KIN+CKTOT+MB+TROPI)
CK, MB: 10.1 ng/mL (ref 0.3–4.0)
Troponin I: 0.56 ng/mL (ref <0.30)

## 2012-03-03 MED ORDER — HYDROCODONE-ACETAMINOPHEN 5-325 MG PO TABS
1.0000 | ORAL_TABLET | Freq: Four times a day (QID) | ORAL | Status: DC | PRN
Start: 2012-03-03 — End: 2012-03-05
  Administered 2012-03-03 – 2012-03-04 (×2): 1 via ORAL
  Filled 2012-03-03 (×2): qty 1

## 2012-03-03 MED ORDER — FUROSEMIDE 10 MG/ML IJ SOLN
40.0000 mg | Freq: Once | INTRAMUSCULAR | Status: AC
  Administered 2012-03-03: 40 mg via INTRAVENOUS
  Filled 2012-03-03: qty 4

## 2012-03-03 NOTE — Progress Notes (Signed)
Utilization review completed.  

## 2012-03-03 NOTE — Progress Notes (Signed)
Patient ID: [name], female   DOB: 05/13/1971, 41 y.o.   MRN: [id number]    Subjective:  Denies SSCP, palpitations or Dyspnea Wants to shower  Objective:  Filed Vitals:   03/03/12 0319 03/03/12 0500 03/03/12 0809 03/03/12 0848  BP: 105/56  111/76   Pulse: 93     Temp: 98 F (36.7 C)  98.1 F (36.7 C)   TempSrc: Oral  Oral   Resp: 26     Height:      Weight:  81.7 kg (180 lb 1.9 oz)    SpO2: 98%   100%    Intake/Output from previous day:  Intake/Output Summary (Last 24 hours) at 03/03/12 1018 Last data filed at 03/03/12 0320  Gross per 24 hour  Intake    723 ml  Output   1150 ml  Net   -427 ml    Physical Exam:  Affect appropriate Healthy:  appears stated age HEENT: normal Neck supple with no adenopathy JVP hard to evaluate no bruits no thyromegaly Lungs clear with no wheezing and good diaphragmatic motion Heart:  S1/S2 SEM murmur, no rub, gallop or click PMI normal Abdomen: benighn, BS positve, no tenderness, no AAA no bruit.  No HSM or HJR Distal pulses intact with no bruits LUE mild edema  edema Neuro non-focal Skin warm and dry No muscular weakness AICD under left clavicle wound healing well   Lab Results: Basic Metabolic Panel:  Basename 03/03/12 0225 03/02/12 0944  NA 135 136  K 3.6 3.2*  CL 97 95*  CO2 26 26  GLUCOSE 121* 116*  BUN 19 15  CREATININE 0.71 0.59  CALCIUM 9.3 9.7  MG -- --  PHOS -- --   Liver Function Tests:  Basename 03/02/12 1519 03/02/12 0944  AST 40* 42*  ALT 21 23  ALKPHOS 73 80  BILITOT 1.0 1.0  PROT 8.3 8.8*  ALBUMIN 3.7 3.8   No results found for this basename: LIPASE:2,AMYLASE:2 in the last 72 hours CBC:  Basename 03/03/12 0225 03/02/12 0944  WBC 8.2 7.7  NEUTROABS -- 5.1  HGB 11.1* 11.9*  HCT 35.8* 38.2  MCV 84.4 83.2  PLT 311 346   Cardiac Enzymes:  Basename 03/03/12 0225 03/02/12 2036 03/02/12 1518  CKTOTAL 95 96 101  CKMB 10.1* 9.9* 11.5*  CKMBINDEX -- -- --  TROPONINI 0.56* 0.40* 0.45*    BNP: No components found with this basename: POCBNP:3 D-Dimer: No results found for this basename: DDIMER:2 in the last 72 hours Hemoglobin A1C: No results found for this basename: HGBA1C in the last 72 hours Fasting Lipid Panel: No results found for this basename: CHOL,HDL,LDLCALC,TRIG,CHOLHDL,LDLDIRECT in the last 72 hours Thyroid Function Tests:  Basename 03/02/12 1519  TSH 8.293*  T4TOTAL --  T3FREE --  THYROIDAB --   Anemia Panel: No results found for this basename: VITAMINB12,FOLATE,FERRITIN,TIBC,IRON,RETICCTPCT in the last 72 hours  Imaging: Dg Chest 2 View  03/02/2012  *RADIOLOGY REPORT*  Clinical Data: Shortness of breath with back discomfort.  Recent defibrillator placement.  Asthma.  Bronchitis.  CHEST - 2 VIEW  Comparison: 02/13/2012  Findings: Pacer / AICD device.  Leads right atrium right ventricle. No lead discontinuity.  There are also pacer wires projecting over the right side heart which are likely  percutaneous.  Cardiomegaly. No pleural effusion or pneumothorax.  No congestive failure.  Low lung volumes. No lobar consolidation.  IMPRESSION: Cardiomegaly and low lung volumes. No acute findings.  Original Report Authenticated By: Consuello Bossier, M.D.   Ct  Angio Chest W/cm &/or Wo Cm  03/02/2012  **ADDENDUM** CREATED: 03/02/2012 18:45:18  The position of the malplaced pacemaker/AICD lead, and the presence of the pericardial effusion were discussed in person with Dr. Eden Emms by Dr. Llana Aliment on 03/02/2012 at 06:45 p.m.  **END ADDENDUM** SIGNED BY: Florencia Reasons, M.D.   03/02/2012  *RADIOLOGY REPORT*  Clinical Data: Chest pain, shortness of breath and dry cough.  CT ANGIOGRAPHY CHEST  Technique:  Multidetector CT imaging of the chest using the standard protocol during bolus administration of intravenous contrast. Multiplanar reconstructed images including MIPs were obtained and reviewed to evaluate the vascular anatomy.  Contrast: 80mL OMNIPAQUE IOHEXOL 350 MG/ML SOLN   Comparison: No priors.  Findings:  Mediastinum: There are no filling defects within the pulmonary arterial tree to suggest underlying pulmonary embolism.  Heart size is mildly enlarged.  There is a moderate pericardial effusion which has accumulated predominantly posteriorly and laterally overlying the left ventricle (there is very little fluid anterior to the right ventricle, and a small amount of fluid posterior and inferior to the right atrium).  Numerous reactive size mediastinal and hilar lymph nodes are noted.  No definite pathologically enlarged mediastinal or hilar lymph nodes are evident on today's examination.  Postoperative changes of median sternotomy for recent septal myomectomy (performed at [facility] on 01/27/2012). Epicardial pacing wires remain in place, extending into the soft tissues at the superior aspect of the interatrial septum (adjacent to the superior cavoatrial junction.  There is also a left-sided pacemaker / AICD in place with leads terminating in the right atrium and right ventricular apex. Notably, there is an additional defibrillator lead which extends into the azygos vein inferiorly and crosses to the hemi azygos system, clearly exterior to the heart.  Heart size is enlarged and there is asymmetric left ventricular hypertrophy (the interventricular septum is most profoundly thickened, measuring up to 35 mm in thickness on axial image 53 of series 4).  Lungs/Pleura: There are areas of passive atelectasis in the left lower lobe, and inferior segment of the lingula.  In the lungs bilaterally there is a background of mild ground-glass attenuation with some interlobular septal thickening and slight thickening of the fissures, suggestive of very mild interstitial pulmonary edema. No consolidative airspace disease.  No definite suspicious appearing pulmonary nodules or masses are identified.  Upper Abdomen: Unremarkable.  Musculoskeletal:  Median sternotomy wires. There are no aggressive  appearing lytic or blastic lesions noted in the visualized portions of the skeleton.  IMPRESSION:  1.  No evidence of pulmonary embolism. 2.  However, there is a moderate - large pericardial effusion which is predominately located over the left ventricle, as detailed above.  Clinical correlation for signs symptoms of acute pericarditis are recommended. 3.  Status post median sternotomy for recent septal myomectomy (history of hypertrophic obstructive cardiomyopathy).  There are both epicardial pacing wires in place, and a left-sided pacemaker/AICD in place.  Notably, one of the defibrillator leads for the pacemaker/AICD appears been properly localized within the azygos venous system, as detailed above.  Clinical correlation is recommended. 4. Background of mild interstitial pulmonary edema in the lungs. There are also areas of passive atelectasis in the left lung, as above.  Original Report Authenticated By: Florencia Reasons, M.D.    Cardiac Studies:  ECG:  LBBB new since preop  intermitant p sync pacing   Telemetry: NSR pacing no arrhythmia  Echo:  Severe LVH not much myectomy seen.  Functional mild MS from repair.  Mild AS  No lvot gradient   EF down compared to 11/12 30-35% Moderate pericardial effusion No tamponade IVC flat Effusion   Mostly posterior and lateral   Medications:     . amiodarone  200 mg Oral Daily  . aspirin  81 mg Oral Daily  . colchicine  0.6 mg Oral BID  . dabigatran  150 mg Oral Q12H  . fluticasone  1 puff Inhalation BID  . furosemide  80 mg Intravenous Once  . furosemide  40 mg Oral Daily  . loratadine  10 mg Oral Daily  . LORazepam  1 mg Oral Once  . metoprolol succinate  50 mg Oral Daily  . potassium chloride  40 mEq Oral STAT  . potassium chloride  40 mEq Oral Once  . 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  BNP mildly elevated contineu lasix.   No lvot gradient so less risk DVT:  Despite pericardial effusion need to continue Pradaxa given extent of recent RUE subclavian, axillary and brachial thrombus  CT negative for PE AICD:  Discussed with Dr Graciela Husbands.  ? Azygous lead placed due to high DFT thresholds in patient with HOCM and severe LVH.  He will try to get records from [facility]:  On pradaxa and continue amiodarone.  Doubt acute lung toxicity.  Check PFT;s and DLCO as outpatient.    Charlton Haws 03/03/2012, 10:18 AM

## 2012-03-03 NOTE — Progress Notes (Signed)
RN called to room due to pt stating "having a hard time breathing, short of breath with talking, and hurts when breathing in."   Marked notice of increased use of accessory muscles to breath, O2 sats 95% on RA, placed back on 2L [place]. While speaking noticed increased SOB. Can hear more crackles in LLL overwise breath sounds remain unchanged.   MD paged and new orders given for CXR and IV Lasix 40mg , both completed.  Will continue to monitor for further changes.

## 2012-03-04 DIAGNOSIS — I429 Cardiomyopathy, unspecified: Secondary | ICD-10-CM

## 2012-03-04 DIAGNOSIS — I4891 Unspecified atrial fibrillation: Secondary | ICD-10-CM

## 2012-03-04 DIAGNOSIS — I422 Other hypertrophic cardiomyopathy: Secondary | ICD-10-CM

## 2012-03-04 NOTE — Progress Notes (Signed)
Report called to [employer]. Pt transferred to 2038 via w/c with belongings. [name]

## 2012-03-04 NOTE — Progress Notes (Signed)
Patient ID: [name], female   DOB: Jun 27, 1971, 41 y.o.   MRN: [id number]    Subjective:  Denies SSCP, palpitations or Dyspnea Dry heaves every afternoon since surgery Had extra lasix yesterday for dyspnea  Objective:  Filed Vitals:   03/04/12 0333 03/04/12 0335 03/04/12 0725 03/04/12 0820  BP:  116/68 113/72   Pulse:  89 94 101  Temp: 97.5 F (36.4 C)  98.7 F (37.1 C)   TempSrc: Oral     Resp:  23 20   Height:      Weight:      SpO2:  99% 99% 94%    Intake/Output from previous day:  Intake/Output Summary (Last 24 hours) at 03/04/12 4098 Last data filed at 03/04/12 1191  Gross per 24 hour  Intake    246 ml  Output   1300 ml  Net  -1054 ml    Physical Exam:  Affect appropriate Healthy:  appears stated age HEENT: normal Neck supple with no adenopathy JVP hard to evaluate no bruits no thyromegaly Lungs clear with no wheezing and good diaphragmatic motion Heart:  S1/S2 SEM murmur, no rub, gallop or click PMI normal Abdomen: benighn, BS positve, no tenderness, no AAA no bruit.  No HSM or HJR Distal pulses intact with no bruits LUE mild edema  edema Neuro non-focal Skin warm and dry No muscular weakness AICD under left clavicle wound healing well   Lab Results: Basic Metabolic Panel:  Basename 03/03/12 0225 03/02/12 0944  NA 135 136  K 3.6 3.2*  CL 97 95*  CO2 26 26  GLUCOSE 121* 116*  BUN 19 15  CREATININE 0.71 0.59  CALCIUM 9.3 9.7  MG -- --  PHOS -- --   Liver Function Tests:  [facility] 03/02/12 1519 03/02/12 0944  AST 40* 42*  ALT 21 23  ALKPHOS 73 80  BILITOT 1.0 1.0  PROT 8.3 8.8*  ALBUMIN 3.7 3.8   No results found for this basename: LIPASE:2,AMYLASE:2 in the last 72 hours CBC:  Basename 03/03/12 0225 03/02/12 0944  WBC 8.2 7.7  NEUTROABS -- 5.1  HGB 11.1* 11.9*  HCT 35.8* 38.2  MCV 84.4 83.2  PLT 311 346   Cardiac Enzymes:  Basename 03/03/12 0225 03/02/12 2036 03/02/12 1518  CKTOTAL 95 96 101  CKMB 10.1* 9.9* 11.5*    CKMBINDEX -- -- --  TROPONINI 0.56* 0.40* 0.45*  Thyroid Function Tests:  Basename 03/02/12 1519  TSH 8.293*  T4TOTAL --  T3FREE --  THYROIDAB --    Imaging: CXR 6/4 Cardiomegaly no edema.    Cardiac Studies:  ECG:  LBBB new since preop  intermitant p sync pacing   Telemetry: NSR pacing no arrhythmia  Echo:  Severe LVH not much myectomy seen.  Functional mild MS from repair.  Mild AS No lvot gradient   EF down compared to 11/12 30-35% Moderate pericardial effusion No tamponade IVC flat Effusion   Mostly posterior and lateral   Medications:      . aspirin  81 mg Oral Daily  . colchicine  0.6 mg Oral BID  . dabigatran  150 mg Oral Q12H  . fluticasone  1 puff Inhalation BID  . furosemide  40 mg Intravenous Once  . furosemide  40 mg Oral Daily  . loratadine  10 mg Oral Daily  . metoprolol succinate  50 mg Oral Daily  . sodium chloride  3 mL Intravenous Q12H  . 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  BNP mildly elevated contineu lasix.  No lvot gradient so less risk DVT:  Despite pericardial effusion need to continue Pradaxa given extent of recent RUE subclavian, axillary and brachial thrombus  CT negative for PE AICD:  Discussed with Dr Graciela Husbands.  ? Azygous lead placed due to high DFT thresholds in patient with HOCM and severe LVH.  He will try to get records from [facility]:  On pradaxa Stop amiodarone as I suspect this is the reason for her dry heaves and nausea.    Transfer to floor D/C in am if stable.  F/U echo for pericardial effusion in 2-3 weeks   Charlton Haws 03/04/2012, 8:33 AM

## 2012-03-04 NOTE — Progress Notes (Signed)
Patient [name], 41 year old female suffers with respiratory challenges following a recent heart surgery.  She hopes to go home tomorrow, but is now experiencing "dry heaves."  Patient enjoys the emotional support of her daughter, and her boyfriend.  She is anxious about getting home to care for her 3 dogs who "don't like to be walked by anyone but me."  Patient expressed appreciation for Chaplain's provision of pastoral prayer, presence, and conversation.  I will follow-up as needed.

## 2012-03-05 ENCOUNTER — Encounter (HOSPITAL_COMMUNITY): Payer: Self-pay | Admitting: Nurse Practitioner

## 2012-03-05 ENCOUNTER — Telehealth: Payer: Self-pay | Admitting: Cardiovascular Disease

## 2012-03-05 DIAGNOSIS — I82409 Acute embolism and thrombosis of unspecified deep veins of unspecified lower extremity: Secondary | ICD-10-CM | POA: Insufficient documentation

## 2012-03-05 DIAGNOSIS — I428 Other cardiomyopathies: Secondary | ICD-10-CM

## 2012-03-05 DIAGNOSIS — I313 Pericardial effusion (noninflammatory): Secondary | ICD-10-CM

## 2012-03-05 DIAGNOSIS — I509 Heart failure, unspecified: Secondary | ICD-10-CM

## 2012-03-05 DIAGNOSIS — I5043 Acute on chronic combined systolic (congestive) and diastolic (congestive) heart failure: Secondary | ICD-10-CM

## 2012-03-05 MED ORDER — COLCHICINE 0.6 MG PO TABS: 0.6000 mg | ORAL_TABLET | Freq: Two times a day (BID) | ORAL | Status: DC

## 2012-03-05 NOTE — Telephone Encounter (Signed)
Please return call to patient at hospital [phone] regarding medication issues. Patient medication switched from amiodarone, and she cannot afford prescription.  Patient wanted to know if she can get medication samples until she can get the money to pay for Script.   Patient does not want to end up back in the hospital again because of the medication issues.  Patient will be discharged at some point today please try to return this call to room [phone] or try to reach boyfriend Jeannene Patella [phone].  Patient has give O.K. To speak with boyfriend.

## 2012-03-05 NOTE — Discharge Summary (Signed)
Patient ID: [name],  MRN: [id number], DOB/AGE: 1971-09-17 41 y.o.  Admit date: 03/02/2012 Discharge date: 03/05/2012  Primary Care Provider: Eustaquio Boyden, MD Primary Cardiologist: Judie Petit. Excell Seltzer, MD;  A. Regino Schultze, MD ([facility]); Suzzanne Cloud ([facility])  Discharge Diagnoses Principal Problem:  *Pericardial effusion Active Problems:  Hypertrophic cardiomyopathy  Deep venous thrombosis of upper extremity  Postoperative atrial fibrillation  Nonischemic cardiomyopathy  Acute on chronic combined systolic and diastolic congestive heart failure   Allergies Allergies  Allergen Reactions  . Warfarin And Related     Skin reaction    Procedures  CTA of Chest with contrast  03/02/2012  IMPRESSION:  1.  No evidence of pulmonary embolism. 2.  However, there is a moderate - large pericardial effusion which is predominately located over the left ventricle, as detailed above.  Clinical correlation for signs symptoms of acute pericarditis are recommended. 3.  Status post median sternotomy for recent septal myomectomy (history of hypertrophic obstructive cardiomyopathy).  There are both epicardial pacing wires in place, and a left-sided pacemaker/AICD in place.  Notably, one of the defibrillator leads for the pacemaker/AICD appears been properly localized within the azygos venous system, as detailed above.  Clinical correlation is recommended. 4. Background of mild interstitial pulmonary edema in the lungs. There are also areas of passive atelectasis in the left lung, as above. _____________  2D Echocardiogram 03/02/2012  Study Conclusions  - Left ventricle: There is septal dyssynergy. There is   hypokinesis of the other walls. The cavity size was mildly   reduced. Wall thickness was increased in a pattern of   severe LVH. The estimated ejection fraction was 30%. - Aortic valve: Aaortic valve leaflets are not seen well.   There is probably mils AS. - Mitral valve: Very  difficult to assess the mitral   leaflets. Doppler data suggests that there is mild mitral   stenosis. - Left atrium: The atrium was severely dilated. - Right ventricle: Systolic function was mildly reduced. - Pericardium, extracardiac: There is a moderate   circumferential pericardial effusion. Respirophasic data   suggests that theis effusion may have some hemodynamic   effect.. - Impressions: The images from today are of lesser clarity   than 08/2011. There is definite decrease in LV function   since 08/2011. The significant pericardial effusion is new   since the prior study. _____________  History of Present Illness  41 y/o female with h/o HOCM s/p relatively recent septal myomectomy and mitral valve repair @ [facility] in April of this year.  Her post-op course was complicated by VT, afib, and left upper extremity DVT.  For these reasons, she was placed on amiodarone, treated with an ICD, and also initiated on Coumadin therapy.  Unfortunately, she developed a necrotic skin reaction from coumadin and this was switched to Pradaxa.  Since her discharge from [facility], she has had intermittent nausea with vomiting but was otherwise doing relatively well.  On the day of admission she noted dyspnea on exertion.  She also had developed a nonproductive cough - worse @ night and a sensation that she had fluid "up to her throat."  She presented to [facility] on 6/3 and in the ED was felt to have volume overload with a pBNP of 2629.  She also had mild elevation of troponin.  She was treated with 80mg  of IV lasix in the ED.  A CT of her chest was performed and showed no evidence of PE but did show a moderate to large pericardial  effusion.  She was admitted for further evaluation.  Hospital Course  Pt did have rise in her CK MB and troponin as outlined below, with a relatively flat trend.  A 2D echo was carried out to assess her pericardial effusion and it appeared to be moderate and circumferential without tamponade  physiology.  Ef was down @ 30% (outside records showed previous EF of 50%).  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  Her volume status remained stable and she has been maintained on oral lasix therapy.  She will be discharged home today in good condition.  We have arranged for f/u echo in our office in approx 10 days to re-evaluate EF and pericardial effusion.  Discharge Vitals Blood pressure 90/65, pulse 100, temperature 98.2 F (36.8 C), temperature source Oral, resp. rate 18, height 5' (1.524 m), weight 180 lb 1.6 oz (81.693 kg), last menstrual period 01/26/2012, SpO2 93.00%.  Filed Weights   03/03/12 0500 03/04/12 0700 03/05/12 0428  Weight: 180 lb 1.9 oz (81.7 kg) 180 lb 12.4 oz (82 kg) 180 lb 1.6 oz (81.693 kg)   Labs  CBC  Basename 03/03/12 0225  WBC 8.2  NEUTROABS --  HGB 11.1*  HCT 35.8*  MCV 84.4  PLT 311   Basic Metabolic Panel  Basename 03/03/12 0225  NA 135  K 3.6  CL 97  CO2 26  GLUCOSE 121*  BUN 19  CREATININE 0.71  CALCIUM 9.3  MG --  PHOS --   Liver Function Tests  Basename 03/02/12 1519  AST 40*  ALT 21  ALKPHOS 73  BILITOT 1.0  PROT 8.3  ALBUMIN 3.7   Cardiac Enzymes  Basename 03/03/12 0225 03/02/12 2036 03/02/12 1518  CKTOTAL 95 96 101  CKMB 10.1* 9.9* 11.5*  CKMBINDEX -- -- --  TROPONINI 0.56* 0.40* 0.45*   Thyroid Function Tests  Basename 03/02/12 1519  TSH 8.293*  T4TOTAL --  T3FREE --  THYROIDAB --   Disposition  Pt is being discharged home today in good condition.  Follow-up Plans & Appointments  Follow-up Information    Follow up with [facility] on 03/16/2012. (9:30 AM - for repeat echocardiogram)    Contact information:   [address] [place] [address] [phone]      Follow up with Tereso Newcomer, PA on 03/20/2012. (8:50)    Contact information:   [address] [place] [address] [phone]         Discharge Medications  Medication List  As of 03/05/2012  2:59 PM   STOP taking these medications         amiodarone 200 MG tablet         TAKE these medications         aspirin 81 MG chewable tablet   Chew 81 mg by mouth daily.      beclomethasone 80 MCG/ACT inhaler   Commonly known as: QVAR   Inhale 1 puff into the lungs 2 (two) times daily.      cetirizine 10 MG tablet   Commonly known as: ZYRTEC   Take 10 mg by mouth daily.      colchicine 0.6 MG tablet   Take 1 tablet (0.6 mg total) by mouth 2 (two) times daily.      dabigatran 150 MG Caps   Commonly known as: PRADAXA   Take 150 mg  by mouth every 12 (twelve) hours.      furosemide 40 MG tablet   Commonly known as: LASIX   Take 40 mg by mouth daily.      metoprolol succinate 50 MG 24 hr tablet   Commonly known as: TOPROL-XL   Take 50 mg by mouth daily. Take with or immediately following a meal.           Outstanding Labs/Studies  2D Echo on 6/17 to f/u pericardial effusion.  Duration of Discharge Encounter   Greater than 30 minutes including physician time.  Signed, Nicolasa Ducking NP 03/05/2012, 2:59 PM   Patient seen and examined.  Plan as discussed in my rounding note for today and outlined above. Rollene Rotunda  03/05/2012  3:48 PM

## 2012-03-05 NOTE — Discharge Instructions (Signed)
***  PLEASE REMEMBER TO BRING ALL OF YOUR MEDICATIONS TO EACH OF YOUR FOLLOW-UP OFFICE VISITS.  

## 2012-03-05 NOTE — Telephone Encounter (Signed)
I spoke with the pt and she does not have money to pick up her Colchicine when she leaves the hospital.  The pt will get paid on Monday and will be able to pick up her Rx ($20 dollars for a one month supply).  I spoke with Thyra Breed Pharm-D (pager [phone]) and she will take care of getting Colchicine filled for pt until Monday.  I called the pt back and spoke with Avie Arenas a pt care cooridinator and she will page Revonda Standard about pt's medication.

## 2012-03-05 NOTE — Progress Notes (Signed)
Pt. Discharged 03/05/2012  5:45 PM Discharge instructions reviewed with patient/family. Patient/family verbalized understanding. All Rx's given. Questions answered as needed. Pt. Discharged to home with family/self. Patient expressed difficulty getting Colchicine until next paycheck (by Monday), and has [facility] insurance. Cannot afford co-pay at this time. Obtained Rx for 9 pills (through Monday) from Ward Givens, NP. [facility] filled. Meds given to patient along with detailed discharge instructions and HF management. Pt. And visitor verbalized understanding.  Harlow Asa

## 2012-03-05 NOTE — Progress Notes (Signed)
   SUBJECTIVE:  Breathing better.  Still with nausea.   PHYSICAL EXAM Filed Vitals:   03/04/12 1138 03/04/12 1436 03/04/12 2100 03/05/12 0428  BP: 106/67 97/69 95/65  108/72  Pulse: 101 93 88 90  Temp: 98 F (36.7 C) 98.2 F (36.8 C) 98.4 F (36.9 C) 98.5 F (36.9 C)  TempSrc: Oral Oral Oral Oral  Resp: 26 20 18 18   Height:      Weight:    180 lb 1.6 oz (81.693 kg)  SpO2: 95% 94% 92% 90%   General:  No distress Lungs:  Left basilar crackles Heart:  RRR, soft apical systolic murmur Abdomen:  Positive bowel sounds, no rebound no guarding Extremities:  No edema.  LABS: Lab Results  Component Value Date   CKTOTAL 95 03/03/2012   CKMB 10.1* 03/03/2012   TROPONINI 0.56* 03/03/2012   No results found for this or any previous visit (from the past 24 hour(s)).  Intake/Output Summary (Last 24 hours) at 03/05/12 0945 Last data filed at 03/04/12 1138  Gross per 24 hour  Intake      0 ml  Output      0 ml  Net      0 ml     ASSESSMENT AND PLAN:  1)  Warfarin allergy:  Findings noted on biopsy at [facility] with evidence for Coumadin necrosis.  2)  ICD:  No evidence of recent event.  She did have VTach at [facility]  3)  Atrial fibrillation:  She had this at [facility] and it was difficult to control.  Dr. Eden Emms took her off the Amiodarone however as he thought this was contributing to her nausea.  We will need to watch closely for recurrent arrhythmias.    4) Venous thrombosis:  This was documented at [facility] and she is on Pradaxa  5) Cardiomyopathy:  The last EF that I see in outside records is 50%.  The 30% is apparently new.  We plan medical management.  No ACE or ARB at this time because of low BP.  We will titrate this as an outpatient.  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  On ASA and colchicine.  Repeat echocardiogram in two weeks.  Fayrene Fearing [facility] 03/05/2012 9:45 AM

## 2012-03-06 ENCOUNTER — Telehealth: Payer: Self-pay

## 2012-03-06 ENCOUNTER — Telehealth: Payer: Self-pay | Admitting: Cardiovascular Disease

## 2012-03-06 MED ORDER — ONDANSETRON HCL 4 MG PO TABS: 4.0000 mg | ORAL_TABLET | Freq: Three times a day (TID) | ORAL | Status: DC | PRN

## 2012-03-06 MED ORDER — HYDROCODONE-ACETAMINOPHEN 5-500 MG PO TABS: 1.0000 | ORAL_TABLET | Freq: Three times a day (TID) | ORAL | Status: DC | PRN

## 2012-03-06 NOTE — Telephone Encounter (Signed)
I don't see those meds on list but don't seem unreasonable  Okay to send Rx for zofran 4mg  tid prn (#15 x 0)  vicodin 5/500 1 tid prn (#20 x 0)

## 2012-03-06 NOTE — Telephone Encounter (Signed)
I spoke with the pt and let her know that it looks like the PCP is going to prescribe Zofran and Vicodin. I made her aware that we would have instructed her to contact the PCP for these medications.

## 2012-03-06 NOTE — Telephone Encounter (Signed)
Please return call to patient at [phone] regarding possible nausea medication.  Patient does not feel good ever since hospital discharge.

## 2012-03-06 NOTE — Telephone Encounter (Signed)
Pt discharged from [facility] 03/05/12 for SOB and fluid in lungs and around the heart. Left Dr Earmon Phoenix office 2 messages; pt did not get zofran or vicodin on discharge for nausea no vomiting and entire back hurts constantly. No fever, no abdominal pain and no diarrhea. F/U appt with Dr Excell Seltzer on 03/21/12. Pt request our office call in med to [facility] on [address] and [address]. Dr Sharen Hones is out of office Please advise.

## 2012-03-06 NOTE — Telephone Encounter (Signed)
Spoke with patient and advised results rx called into pharmacy  

## 2012-03-09 ENCOUNTER — Encounter (HOSPITAL_COMMUNITY): Payer: Self-pay | Admitting: *Deleted

## 2012-03-09 ENCOUNTER — Emergency Department (HOSPITAL_COMMUNITY): Payer: 59

## 2012-03-09 ENCOUNTER — Inpatient Hospital Stay (HOSPITAL_COMMUNITY)
Admission: EM | Admit: 2012-03-09 | Discharge: 2012-03-11 | DRG: 315 | Disposition: A | Discharge status: Home / Self Care | Payer: 59 | Source: Ambulatory Visit | Attending: Cardiology | Admitting: Cardiology

## 2012-03-09 DIAGNOSIS — R079 Chest pain, unspecified: Secondary | ICD-10-CM | POA: Diagnosis present

## 2012-03-09 DIAGNOSIS — Z87891 Personal history of nicotine dependence: Secondary | ICD-10-CM

## 2012-03-09 DIAGNOSIS — I970 Postcardiotomy syndrome: Secondary | ICD-10-CM

## 2012-03-09 DIAGNOSIS — I422 Other hypertrophic cardiomyopathy: Secondary | ICD-10-CM | POA: Diagnosis present

## 2012-03-09 DIAGNOSIS — I313 Pericardial effusion (noninflammatory): Secondary | ICD-10-CM | POA: Diagnosis present

## 2012-03-09 DIAGNOSIS — I5042 Chronic combined systolic (congestive) and diastolic (congestive) heart failure: Secondary | ICD-10-CM | POA: Diagnosis present

## 2012-03-09 DIAGNOSIS — I82629 Acute embolism and thrombosis of deep veins of unspecified upper extremity: Secondary | ICD-10-CM | POA: Diagnosis present

## 2012-03-09 DIAGNOSIS — I4891 Unspecified atrial fibrillation: Secondary | ICD-10-CM | POA: Diagnosis present

## 2012-03-09 DIAGNOSIS — I319 Disease of pericardium, unspecified: Secondary | ICD-10-CM

## 2012-03-09 DIAGNOSIS — E876 Hypokalemia: Secondary | ICD-10-CM | POA: Diagnosis present

## 2012-03-09 DIAGNOSIS — Z95810 Presence of automatic (implantable) cardiac defibrillator: Secondary | ICD-10-CM

## 2012-03-09 DIAGNOSIS — M549 Dorsalgia, unspecified: Secondary | ICD-10-CM | POA: Diagnosis present

## 2012-03-09 DIAGNOSIS — I509 Heart failure, unspecified: Secondary | ICD-10-CM | POA: Diagnosis present

## 2012-03-09 DIAGNOSIS — E0781 Sick-euthyroid syndrome: Secondary | ICD-10-CM | POA: Diagnosis present

## 2012-03-09 DIAGNOSIS — Z7982 Long term (current) use of aspirin: Secondary | ICD-10-CM

## 2012-03-09 DIAGNOSIS — R0602 Shortness of breath: Secondary | ICD-10-CM

## 2012-03-09 HISTORY — DX: Chronic combined systolic (congestive) and diastolic (congestive) heart failure: I50.42

## 2012-03-09 LAB — BASIC METABOLIC PANEL
BUN: 19 mg/dL (ref 6–23)
CO2: 23 mEq/L (ref 19–32)
Chloride: 93 mEq/L — ABNORMAL LOW (ref 96–112)
GFR calc non Af Amer: 89 mL/min — ABNORMAL LOW (ref >90)
Glucose, Bld: 105 mg/dL — ABNORMAL HIGH (ref 70–99)
Potassium: 4 mEq/L (ref 3.5–5.1)
Sodium: 131 mEq/L — ABNORMAL LOW (ref 135–145)

## 2012-03-09 LAB — CBC
HCT: 34.2 % — ABNORMAL LOW (ref 36.0–46.0)
Hemoglobin: 10.7 g/dL — ABNORMAL LOW (ref 12.0–15.0)
MCV: 82.4 fL (ref 78.0–100.0)
RDW: 15.4 % (ref 11.5–15.5)
WBC: 7.3 10*3/uL (ref 4.0–10.5)

## 2012-03-09 LAB — TSH: TSH: 13.624 u[IU]/mL — ABNORMAL HIGH (ref 0.350–4.500)

## 2012-03-09 LAB — URINE MICROSCOPIC-ADD ON

## 2012-03-09 LAB — URINALYSIS, ROUTINE W REFLEX MICROSCOPIC
Glucose, UA: NEGATIVE mg/dL
Protein, ur: 100 mg/dL — AB
pH: 6 (ref 5.0–8.0)

## 2012-03-09 LAB — APTT: aPTT: 60 seconds — ABNORMAL HIGH (ref 24–37)

## 2012-03-09 LAB — CARDIAC PANEL(CRET KIN+CKTOT+MB+TROPI)
Relative Index: INVALID (ref 0.0–2.5)
Total CK: 81 U/L (ref 7–177)
Total CK: 86 U/L (ref 7–177)
Troponin I: <0.3 ng/mL (ref <0.30)
Troponin I: 0.35 ng/mL (ref <0.30)

## 2012-03-09 LAB — POCT I-STAT TROPONIN I: Troponin i, poc: 0.08 ng/mL (ref 0.00–0.08)

## 2012-03-09 LAB — PROTIME-INR: Prothrombin Time: 20 seconds — ABNORMAL HIGH (ref 11.6–15.2)

## 2012-03-09 MED ORDER — HYDROMORPHONE HCL PF 1 MG/ML IJ SOLN
1.0000 mg | Freq: Once | INTRAMUSCULAR | Status: AC
  Administered 2012-03-09: 1 mg via INTRAVENOUS
  Filled 2012-03-09: qty 1

## 2012-03-09 MED ORDER — COLCHICINE 0.6 MG PO TABS
0.6000 mg | ORAL_TABLET | Freq: Two times a day (BID) | ORAL | Status: DC
  Administered 2012-03-09 – 2012-03-11 (×4): 0.6 mg via ORAL
  Filled 2012-03-09 (×6): qty 1

## 2012-03-09 MED ORDER — FUROSEMIDE 40 MG PO TABS
40.0000 mg | ORAL_TABLET | Freq: Every day | ORAL | Status: DC
  Administered 2012-03-10 – 2012-03-11 (×2): 40 mg via ORAL
  Filled 2012-03-09 (×2): qty 1

## 2012-03-09 MED ORDER — ALPRAZOLAM 0.25 MG PO TABS: 0.2500 mg | ORAL_TABLET | Freq: Two times a day (BID) | ORAL | Status: DC | PRN

## 2012-03-09 MED ORDER — ONDANSETRON HCL 4 MG/2ML IJ SOLN
4.0000 mg | Freq: Once | INTRAMUSCULAR | Status: AC
  Administered 2012-03-09: 4 mg via INTRAVENOUS
  Filled 2012-03-09: qty 2

## 2012-03-09 MED ORDER — METOPROLOL SUCCINATE ER 50 MG PO TB24
50.0000 mg | ORAL_TABLET | Freq: Every day | ORAL | Status: DC
  Administered 2012-03-10 – 2012-03-11 (×2): 50 mg via ORAL
  Filled 2012-03-09 (×3): qty 1

## 2012-03-09 MED ORDER — ACETAMINOPHEN 325 MG PO TABS: 650.0000 mg | ORAL_TABLET | ORAL | Status: DC | PRN

## 2012-03-09 MED ORDER — ONDANSETRON HCL 4 MG/2ML IJ SOLN
4.0000 mg | Freq: Four times a day (QID) | INTRAMUSCULAR | Status: DC | PRN
  Administered 2012-03-09 – 2012-03-11 (×3): 4 mg via INTRAVENOUS
  Filled 2012-03-09 (×3): qty 2

## 2012-03-09 MED ORDER — ZOLPIDEM TARTRATE 5 MG PO TABS: 5.0000 mg | ORAL_TABLET | Freq: Every evening | ORAL | Status: DC | PRN

## 2012-03-09 MED ORDER — SODIUM CHLORIDE 0.9 % IJ SOLN
3.0000 mL | Freq: Two times a day (BID) | INTRAMUSCULAR | Status: DC
  Administered 2012-03-09 – 2012-03-11 (×4): 3 mL via INTRAVENOUS

## 2012-03-09 MED ORDER — SODIUM CHLORIDE 0.9 % IJ SOLN: 3.0000 mL | INTRAMUSCULAR | Status: DC | PRN

## 2012-03-09 MED ORDER — ONDANSETRON HCL 4 MG PO TABS: 4.0000 mg | ORAL_TABLET | Freq: Three times a day (TID) | ORAL | Status: DC | PRN

## 2012-03-09 MED ORDER — ASPIRIN 81 MG PO CHEW
81.0000 mg | CHEWABLE_TABLET | Freq: Every day | ORAL | Status: DC
  Administered 2012-03-10 – 2012-03-11 (×2): 81 mg via ORAL
  Filled 2012-03-09 (×2): qty 1

## 2012-03-09 MED ORDER — LORATADINE 10 MG PO TABS
10.0000 mg | ORAL_TABLET | Freq: Every day | ORAL | Status: DC
  Filled 2012-03-09 (×3): qty 1

## 2012-03-09 MED ORDER — SODIUM CHLORIDE 0.9 % IV SOLN: 250.0000 mL | INTRAVENOUS | Status: DC | PRN

## 2012-03-09 MED ORDER — HYDROCODONE-ACETAMINOPHEN 5-325 MG PO TABS
1.0000 | ORAL_TABLET | ORAL | Status: DC | PRN
  Administered 2012-03-09: 2 via ORAL
  Administered 2012-03-10 – 2012-03-11 (×3): 1 via ORAL
  Filled 2012-03-09 (×3): qty 1
  Filled 2012-03-09: qty 2

## 2012-03-09 MED ORDER — DABIGATRAN ETEXILATE MESYLATE 150 MG PO CAPS
150.0000 mg | ORAL_CAPSULE | Freq: Two times a day (BID) | ORAL | Status: DC
  Filled 2012-03-09 (×2): qty 1

## 2012-03-09 MED ORDER — FLUTICASONE PROPIONATE HFA 44 MCG/ACT IN AERO
1.0000 | INHALATION_SPRAY | Freq: Two times a day (BID) | RESPIRATORY_TRACT | Status: DC
  Administered 2012-03-09 – 2012-03-11 (×4): 1 via RESPIRATORY_TRACT
  Filled 2012-03-09: qty 10.6

## 2012-03-09 NOTE — Progress Notes (Signed)
  Echocardiogram 2D Echocardiogram has been performed.  Emelia Loron A 03/09/2012, 2:30 PM

## 2012-03-09 NOTE — ED Notes (Signed)
Made rounds on pt. Pt.states that she is still in pain.

## 2012-03-09 NOTE — Telephone Encounter (Signed)
Will leave for Dr Reece Agar to review on his return tomorrow

## 2012-03-09 NOTE — Telephone Encounter (Signed)
Noted.  Admitted.  Post-cardiac surgery pericardial effusion, ?post-pericardiotomy syndrome.  To involve CVTS.

## 2012-03-09 NOTE — Telephone Encounter (Signed)
Pt left v/m at [facility] ER now due to mid upper back pain and rib pain. Pt needed stronger med for pain. I called and spoke with gentleman who had pts phone and she is still in ER for pain med not sure if going to be admitted. Pt to call back if needed.

## 2012-03-09 NOTE — ED Notes (Signed)
Cardiology at bedside. 

## 2012-03-09 NOTE — ED Notes (Signed)
C/o mid back pain & rib pain x 3 days

## 2012-03-09 NOTE — Progress Notes (Signed)
Difficult situation in patient with post-cardiac surgery pericardial effusion and acute upper extremity DVT. She has been anticoagulated with Pradaxa. Effusion is large by echo today. Have requested cardiac surgery eval for help with management and consideration of pericardial window. It may be worth considering discontinuation of pradaxa and close follow-up versus pericardial window. She is not in respiratory distress at present and I'm not convinced the pain that brought her in today is related to post-pericardiotomy syndrome.  Tonny Bollman 03/09/2012 4:49 PM

## 2012-03-09 NOTE — H&P (Signed)
History and Physical  Patient ID: [name] Patient ID: [name] MRN: [id number], DOB/AGE: 41/09/1970 41 y.o. Date of Encounter: 03/09/2012  Primary Physician: Eustaquio Boyden, MD, MD Primary Cardiologist: [facility]  Chief Complaint:  Chest pain  HPI: [name] is a 41 year old female with a history of HOCM and surgery 4/13 at [facility]. She was hospitalized 6/3-03/05/2012 here for SOB felt secondary to a pericardial effusion and volume overload.   2 days ago, [name] developed bilateral lower rib chest pain. It is worse on the left than on the right. It does not change significantly with deep inspiration but her chest wall is tender to palpation. She has not been coughing more than usual and the pain does not change with cough. She has had some low-grade temperatures to 100.2 but this has not been constant. Her respiratory status has been stable. She has not noticed any weight gain or lower extremity edema. Her pain became worse today and reached a 9/10. She has been taking Vicodin at home but has not been getting much relief. She has been compliant with her other medications including the colchicine that was added during her last admission. Currently she is moderately uncomfortable.  Past Medical History  Diagnosis Date  . Hypertrophic cardiomyopathy     a) heart murmur age 60 but HOCM was eventually diagnosed during pregnancy. b) s/p septal myomectomy with implantable defibrillator 01/27/2012   . Anxiety state, unspecified   . Allergic rhinitis     to dogs  . Asthma   . History of anemia     during pregnancy  . History of chicken pox   . Generalized headaches   . Ocular migraine     no HA  . Back pain     told cracked bone, vicodin didn't help, improved with percocets/muscle relaxants, no eval by prior PCP  . 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 DVT (deep venous thrombosis)     Diagnosed 02/13/12  - was placed on Warfarin after  cardiac surgery but had skin reaction to warfarin so was changed to Pradaxa so DVT occurred while on Pradaxa although unclear if it occurred during transition  . Hypotension   . Atrial fibrillation     a. On pradaxa.  coumadin necrosis with warfarin;  b. Amio d/c'd 02/2012  . ICD (implantable cardiac defibrillator) in place     02/04/2012, [employer]  . Acute on chronic combined systolic and diastolic congestive heart failure   . Pericardial effusion      Surgical History:  Past Surgical History  Procedure Date  . Cesarean section 1991  . Induced abortion 1990 and 1993  . US echocardiography 08/2011    severe LVH, severe asymmetric hypertrophy, EF 50-55%, grade 2 diastolic dysfunction  . Cardiac surgery 01/27/2012    median sternotomy, septal myectomy, MVR - [facility] (Dr. Silvestre Mesi)  . Cardiac defibrillator placement 01/2012  . Myomectomy      I have reviewed the patient's current medications. Medication Sig  aspirin 81 MG chewable tablet Chew 81 mg by mouth daily.  beclomethasone (QVAR) 80 MCG/ACT inhaler Inhale 1 puff into the lungs 2 (two) times daily.  cetirizine (ZYRTEC) 10 MG tablet Take 10 mg by mouth daily.    colchicine 0.6 MG tablet Take 1 tablet (0.6 mg total) by mouth 2 (two) times daily.  dabigatran (PRADAXA) 150 MG CAPS Take 150 mg by mouth every 12 (twelve) hours.  furosemide (LASIX) 40 MG tablet Take 40 mg by mouth daily.  HYDROcodone-acetaminophen (VICODIN) 5-500 MG per tablet Take 1 tablet by mouth 3 (three) times daily as needed.  metoprolol succinate (TOPROL-XL) 50 MG 24 hr tablet Take 50 mg by mouth daily. Take with or immediately following a meal.  ondansetron (ZOFRAN) 4 MG tablet Take 1 tablet (4 mg total) by mouth 3 (three) times daily as needed.    Scheduled Meds:   .  HYDROmorphone (DILAUDID) injection  1 mg Intravenous Once  .  HYDROmorphone (DILAUDID) injection  1 mg Intravenous Once  . ondansetron  4 mg Intravenous Once   Continuous Infusions:  PRN  Meds:.  Allergies:  Allergies  Allergen Reactions  . Warfarin And Related     Skin reaction    History   Social History  . Marital Status: Single    Spouse Name: N/A    Number of Children: 1  . Years of Education: N/A   Occupational History  . Naval architect    Social History Main Topics  . Smoking status: Former Smoker -- 1.0 packs/day for 3 years    Types: Cigarettes    Quit date: 09/30/1988  . Smokeless tobacco: Never Used   Comment: quitin 1990  . Alcohol Use: 0.0 oz/week     rarely drinks wine  . Drug Use: No     quiti n 1989  . Sexually Active: Yes    Birth Control/ Protection: Pill   Other Topics Concern  . Not on file   Social History Narrative   Caffeine: 1 cup coffee/dayDivorced, 1 child.  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 Diabetes Father   . Heart disease Father     unspecified heart problem  . Thyroid disease Mother   . Anemia Mother     mediterranean anemia, ITP  . Heart disease Maternal Grandfather     CHF  . Diabetes Paternal Grandfather   . Cancer Paternal Grandfather   . Coronary artery disease Neg Hx   . Stroke Neg Hx   . Sudden death Neg Hx     Review of Systems: She has had a slight nonproductive cough. She has not had any chills or sweats. She has been sleeping sitting up as directed since the surgery. Her chest pain is slightly better leaning forward but worse lying back and lying on her left side. She has chronic lower back pain that has not changed any recently. Full 14-point review of systems otherwise negative except as noted above.   Physical Exam: Blood pressure 110/73, pulse 94, temperature 97.9 F (36.6 C), temperature source Oral, resp. rate 22, height 5' (1.524 m), weight 180 lb (81.647 kg), last menstrual period 01/26/2012, SpO2 98.00%. General: Well  developed, well nourished, female in mild distress. Head: Normocephalic, atraumatic, sclera non-icteric, no xanthomas, nares are without discharge. Dentition: Good Neck: No carotid bruits. JVD mildly elevated at about 8 cm. No thyromegally Lungs: Good expansion bilaterally without wheezes or rhonchi. Rales in both bases with some crackles on the left Heart: Regular rate and rhythm with S1 S2.  No S3 or S4.  Heart murmur or possible rub, no gallops appreciated. Abdomen: Soft, non-tender, non-distended with normoactive bowel sounds. No hepatomegaly. No rebound/guarding. No obvious abdominal masses. Msk:  Strength and tone appear normal weak for age. No joint deformities or effusions, no spine or  costo-vertebral angle tenderness. Extremities: No clubbing or cyanosis. No edema.  Distal pedal pulses are 2+ in 4 extrem Neuro: Alert and oriented X 3. Moves all extremities spontaneously. No focal deficits noted. Psych:  Responds to questions appropriately with a normal affect. Skin: No rashes noted, skin reaction from the Coumadin is continuing to heal slowly and her ICD incision is without edema, erythema or drainage.  Labs:   Lab Results  Component Value Date   WBC 7.3 03/09/2012   HGB 10.7* 03/09/2012   HCT 34.2* 03/09/2012   MCV 82.4 03/09/2012   PLT 345 03/09/2012     Lab 03/09/12 0829 03/02/12 1519  NA 131* --  K 4.0 --  CL 93* --  CO2 23 --  BUN 19 --  CREATININE 0.81 --  CALCIUM 9.5 --  PROT -- 8.3  BILITOT -- 1.0  ALKPHOS -- 73  ALT -- 21  AST -- 40*  GLUCOSE 105* --    Basename 03/09/12 0905  CKTOTAL 97  CKMB 14.0*  TROPONINI <0.30    Basename 03/09/12 0836  TROPIPOC 0.08   Pro B Natriuretic peptide (BNP)  Date/Time Value Range Status  03/09/2012  8:29 AM 2584.0* 0-125 (pg/mL) Final  03/02/2012  9:45 AM 2629.0* 0-125 (pg/mL) Final     Radiology/Studies:  Dg Chest 2 View 03/09/2012  *RADIOLOGY REPORT*  Clinical Data: Back pain.  CHEST - 2 VIEW  Comparison: 03/03/2012.   Findings: Trachea is midline. Cardiopericardial silhouette is enlarged, stable.  Pacemaker and AICD lead tips are stable in position. Epicardial pacer wires remain in place.  There may be a tiny left pleural effusion.  Minimal bibasilar atelectasis. Persistent volume loss in the medial left lower lobe.  No definite pneumothorax.  IMPRESSION:  1.  Enlarged cardiopericardial silhouette, as before, consistent with a pericardial effusion. 2.  Bibasilar atelectasis with persistent volume loss in the medial left lower lobe. 3.  Tiny left pleural effusion.  Original Report Authenticated By: Reyes Ivan, M.D.    Ct Angio Chest W/cm &/or Wo Cm 03/02/2012  **ADDENDUM** CREATED: 03/02/2012 18:45:18  The position of the malplaced pacemaker/AICD lead, and the presence of the pericardial effusion were discussed in person with Dr. Eden Emms by Dr. Llana Aliment on 03/02/2012 at 06:45 p.m.  **END ADDENDUM** SIGNED BY: Florencia Reasons, M.D.  03/02/2012  *RADIOLOGY REPORT*  Clinical Data: Chest pain, shortness of breath and dry cough.  CT ANGIOGRAPHY CHEST  Technique:  Multidetector CT imaging of the chest using the standard protocol during bolus administration of intravenous contrast. Multiplanar reconstructed images including MIPs were obtained and reviewed to evaluate the vascular anatomy.  Contrast: 80mL OMNIPAQUE IOHEXOL 350 MG/ML SOLN  Comparison: No priors.  Findings:  Mediastinum: There are no filling defects within the pulmonary arterial tree to suggest underlying pulmonary embolism.  Heart size is mildly enlarged.  There is a moderate pericardial effusion which has accumulated predominantly posteriorly and laterally overlying the left ventricle (there is very little fluid anterior to the right ventricle, and a small amount of fluid posterior and inferior to the right atrium).  Numerous reactive size mediastinal and hilar lymph nodes are noted.  No definite pathologically enlarged mediastinal or hilar lymph nodes are  evident on today's examination.  Postoperative changes of median sternotomy for recent septal myomectomy (performed at [facility] on 01/27/2012). Epicardial pacing wires remain in place, extending into the soft tissues at the superior aspect of the interatrial septum (adjacent to the superior cavoatrial junction.  There is also a  left-sided pacemaker / AICD in place with leads terminating in the right atrium and right ventricular apex. Notably, there is an additional defibrillator lead which extends into the azygos vein inferiorly and crosses to the hemi azygos system, clearly exterior to the heart.  Heart size is enlarged and there is asymmetric left ventricular hypertrophy (the interventricular septum is most profoundly thickened, measuring up to 35 mm in thickness on axial image 53 of series 4).  Lungs/Pleura: There are areas of passive atelectasis in the left lower lobe, and inferior segment of the lingula.  In the lungs bilaterally there is a background of mild ground-glass attenuation with some interlobular septal thickening and slight thickening of the fissures, suggestive of very mild interstitial pulmonary edema. No consolidative airspace disease.  No definite suspicious appearing pulmonary nodules or masses are identified.  Upper Abdomen: Unremarkable.  Musculoskeletal:  Median sternotomy wires. There are no aggressive appearing lytic or blastic lesions noted in the visualized portions of the skeleton.  IMPRESSION:  1.  No evidence of pulmonary embolism. 2.  However, there is a moderate - large pericardial effusion which is predominately located over the left ventricle, as detailed above.  Clinical correlation for signs symptoms of acute pericarditis are recommended. 3.  Status post median sternotomy for recent septal myomectomy (history of hypertrophic obstructive cardiomyopathy).  There are both epicardial pacing wires in place, and a left-sided pacemaker/AICD in place.  Notably, one of the  defibrillator leads for the pacemaker/AICD appears been properly localized within the azygos venous system, as detailed above.  Clinical correlation is recommended. 4. Background of mild interstitial pulmonary edema in the lungs. There are also areas of passive atelectasis in the left lung, as above.  Original Report Authenticated By: Florencia Reasons, M.D.    Echo: 03/02/2012 Study Conclusions  - Left ventricle: There is septal dyssynergy. There is hypokinesis of the other walls. The cavity size was mildly reduced. Wall thickness was increased in a pattern of severe LVH. The estimated ejection fraction was 30%. - Aortic valve: Aaortic valve leaflets are not seen well. There is probably mild AS. - Mitral valve: Very difficult to assess the mitral leaflets. Doppler data suggests that there is mild mitral stenosis. - Left atrium: The atrium was severely dilated. - Right ventricle: Systolic function was mildly reduced. - Pericardium, extracardiac: There is a moderate circumferential pericardial effusion. Respirophasic data suggests that theis effusion may have some hemodynamic effect.. - Impressions: The images from today are of lesser clarity than 08/2011. There is definite decrease in LV function since 08/2011. The significant pericardial effusion is new since the prior study. Impressions: - The images from today are of lesser clarity than 08/2011. There is definite decrease in LV function since 08/2011. The significant pericardial effusion is new since the prior study.  ECG: 02-Mar-2012 10:00:16  SINUS TACHYCARDIA ~ V-rate> 99 LEFT ATRIAL ABNORMALITY ~ P,P'>61mS, <-0.89mV V1 LEFT BUNDLE BRANCH BLOCK ~ QRSd>120, broad/notched R Vent. rate 101 BPM PR interval 160 Karen QRS duration 192 Karen QT/QTc 448/581 Karen P-R-T axes 20 -49 130  ASSESSMENT AND PLAN:  Principal Problem:  *Chest pain at rest - we will obtain a stat echo to reassess her effusion. We will check a d-dimer. We will  continue to cycle cardiac enzymes. When necessary medications will be used but she may need an increased dose of colchicine or a course of steroids if there is felt to be an inflammatory cause of her symptoms.   She will be continued on her home  medications for her other problems but she does not appear to be acute volume overloaded at this time. Active Problems:  Hypertrophic cardiomyopathy  Pericardial effusion  Chronic combined systolic and diastolic CHF (congestive heart failure)  Signed,  Rhonda Barrett PA-C 03/09/2012, 11:16 AM Patient seen and examined. I agree with the assessment and plan as detailed above. See also my additional thoughts below.  The patient is seen by me in the emergency room. I have reviewed all the data. Unfortunately she continues to have difficulties. She had a septal myectomy. Unfortunately she has decreased left jugular function. She was recently in the hospital with fluid overload and pericardial effusion. Now she has chest discomfort. It is difficult to know if some of this could be a post pericardiotomy syndrome. She has been on colchicine. Followup echo will be done to compare her pericardial effusion. Her chest x-ray does not suggest that she needs any type of pleural tap.   *Chest pain at rest  Hypertrophic cardiomyopathy  Pericardial effusion  Chronic combined systolic and diastolic CHF (congestive heart failure) Status post myectomy Left ventricular dysfunction.  Jerral Bonito, MD

## 2012-03-09 NOTE — ED Provider Notes (Signed)
History     CSN: [id number]  Arrival date & time 03/09/12  0709   First MD Initiated Contact with Patient 03/09/12 [phone]      Chief Complaint  Patient presents with  . Back Pain    (Consider location/radiation/quality/duration/timing/severity/associated sxs/prior treatment) HPI Pt presents with c/o mid back pain radiating to left ribcage x 3 days.  She states she has had similar back pain in the past, but that it has been worsening over the past few days.  She has a hx of recent cardiac surgery- septal myomectomy and defibrillator placed 01/27/12.  Also recent admission to cardiology for fluid overload (discharged 03/05/12).  Has been c/o back pain while in hospital and also after getting home.  Worse with movement and palpation.  No fever or cough.  Does have some sob, worse with exertion, but overall not worse than her baseline since surgery per patient.  She had vicodin 5/500 prescribed by her PMD yesterday- she states this is not helping.  There are no other alleviating or modifying factors, there are no other associated systemic symptoms  Past Medical History  Diagnosis Date  . Hypertrophic cardiomyopathy     a) heart murmur age 79 but HOCM was eventually diagnosed during pregnancy. b) s/p septal myomectomy with implantable defibrillator 01/27/2012   . Anxiety state, unspecified   . Allergic rhinitis     to dogs  . Asthma   . History of anemia     during pregnancy  . History of chicken pox   . Generalized headaches   . Ocular migraine     no HA  . Back pain     told cracked bone, vicodin didn't help, improved with percocets/muscle relaxants, no eval by prior PCP  . 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 DVT (deep venous thrombosis)     Diagnosed 02/13/12  - was placed on Warfarin after cardiac surgery but had skin reaction to warfarin so was changed to Pradaxa so DVT occurred while on Pradaxa although unclear if it occurred  during transition  . Hypotension   . Atrial fibrillation     a. On pradaxa.  coumadin necrosis with warfarin;  b. Amio d/c'd 02/2012  . ICD (implantable cardiac defibrillator) in place     02/04/2012, [employer]  . Acute on chronic combined systolic and diastolic congestive heart failure   . Pericardial effusion   . Chronic combined systolic and diastolic CHF (congestive heart failure)     Past Surgical History  Procedure Date  . Cesarean section 1991  . Induced abortion 1990 and 1993  . US echocardiography 08/2011    severe LVH, severe asymmetric hypertrophy, EF 50-55%, grade 2 diastolic dysfunction  . Cardiac surgery 01/27/2012    median sternotomy, septal myectomy, MVR - [facility] (Dr. Silvestre Mesi)  . Cardiac defibrillator placement 01/2012  . Myomectomy     Family History  Problem Relation Age of Onset  . Diabetes Father   . Heart disease Father     unspecified heart problem  . Thyroid disease Mother   . Anemia Mother     mediterranean anemia, ITP  . Heart disease Maternal Grandfather     CHF  . Diabetes Paternal Grandfather   . Cancer Paternal Grandfather   . Coronary artery disease Neg Hx   . Stroke Neg Hx   . Sudden death Neg Hx     History  Substance Use Topics  .  Smoking status: Former Smoker -- 1.0 packs/day for 3 years    Types: Cigarettes    Quit date: 09/30/1988  . Smokeless tobacco: Never Used   Comment: quitin 1990  . Alcohol Use: 0.0 oz/week     rarely drinks wine    OB History    Grav Para Term Preterm Abortions TAB SAB Ect Mult Living                  Review of Systems ROS reviewed and all otherwise negative except for mentioned in HPI  Allergies  Warfarin and related  Home Medications   No current outpatient prescriptions on file.  BP 116/82  Pulse 87  Temp(Src) 97.6 F (36.4 C) (Oral)  Resp 18  Ht 5' (1.524 m)  Wt 183 lb 4.8 oz (83.144 kg)  BMI 35.80 kg/m2  SpO2 97%  LMP 01/26/2012 Vitals reviewed Physical Exam Physical  Examination: General appearance - alert, concerned appearing, and in no distress Mental status - alert, oriented to person, place, and time Eyes - pupils equal and reactive, no scleral icterus or conjunctival injection Mouth - mucous membranes moist, pharynx normal without lesions Chest - clear to auscultation, no wheezes, rales or rhonchi, symmetric air entry, mild increased respiratory effort, breath sounds symmetric Heart - mild tachycardia, regular rhythm, normal S1, S2, no murmurs, rubs, clicks or gallops Abdomen - soft, nontender, nondistended, no masses or organomegaly Back exam - some ttp over left mid upper back in paraspinal region- no midline tenderness, no overlying rash, no crepitus Extremities - peripheral pulses normal, no pedal edema, no clubbing or cyanosis Skin - normal coloration and turgor, no rashes, brisk cap refill  ED Course  Procedures (including critical care time)   Date: 03/09/2012  Rate: 96  Rhythm: sinus rhythm  QRS Axis: left  Intervals: LBBB  ST/T Wave abnormalities: indeterminate  Conduction Disutrbances:left bundle branch block  Narrative Interpretation:   Old EKG Reviewed: unchanged  9:51 AM  Discussed with [facility] cardiogy, they will see patient for consultation in ED    Labs Reviewed  URINALYSIS, ROUTINE W REFLEX MICROSCOPIC - Abnormal; Notable for the following:    Color, Urine AMBER (*) BIOCHEMICALS MAY BE AFFECTED BY COLOR   APPearance CLOUDY (*)    Hgb urine dipstick SMALL (*)    Bilirubin Urine SMALL (*)    Protein, ur 100 (*)    Leukocytes, UA TRACE (*)    All other components within normal limits  CBC - Abnormal; Notable for the following:    Hemoglobin 10.7 (*)    HCT 34.2 (*)    MCH 25.8 (*)    All other components within normal limits  PRO B NATRIURETIC PEPTIDE - Abnormal; Notable for the following:    Pro B Natriuretic peptide (BNP) 2584.0 (*)    All other components within normal limits  BASIC METABOLIC PANEL - Abnormal; Notable  for the following:    Sodium 131 (*)    Chloride 93 (*)    Glucose, Bld 105 (*)    GFR calc non Af Amer 89 (*)    All other components within normal limits  URINE MICROSCOPIC-ADD ON - Abnormal; Notable for the following:    Squamous Epithelial / LPF MANY (*)    Crystals CA OXALATE CRYSTALS (*)    All other components within normal limits  CARDIAC PANEL(CRET KIN+CKTOT+MB+TROPI) - Abnormal; Notable for the following:    CK, MB 14.0 (*)    All other components within normal limits  D-DIMER,  QUANTITATIVE - Abnormal; Notable for the following:    D-Dimer, Quant 2.08 (*)    All other components within normal limits  POCT I-STAT TROPONIN I  MAGNESIUM  URINE CULTURE  CARDIAC PANEL(CRET KIN+CKTOT+MB+TROPI)  CARDIAC PANEL(CRET KIN+CKTOT+MB+TROPI)  PROTIME-INR  APTT  TSH  HEMOGLOBIN A1C   Dg Chest 2 View  03/09/2012  *RADIOLOGY REPORT*  Clinical Data: Back pain.  CHEST - 2 VIEW  Comparison: 03/03/2012.  Findings: Trachea is midline. Cardiopericardial silhouette is enlarged, stable.  Pacemaker and AICD lead tips are stable in position. Epicardial pacer wires remain in place.  There may be a tiny left pleural effusion.  Minimal bibasilar atelectasis. Persistent volume loss in the medial left lower lobe.  No definite pneumothorax.  IMPRESSION:  1.  Enlarged cardiopericardial silhouette, as before, consistent with a pericardial effusion. 2.  Bibasilar atelectasis with persistent volume loss in the medial left lower lobe. 3.  Tiny left pleural effusion.  Original Report Authenticated By: Reyes Ivan, M.D.     1. Chronic combined systolic and diastolic CHF (congestive heart failure)   2. Back pain   3. Shortness of breath   4. Pericardial effusion   5. Chest pain at rest       MDM  Pt presenting with c/o mid upper back pain,  She is also noted to be short of breath on my evaluation although this is not her primary complaint.  CXR shows continued cardiac enlargement c/w continued  pericardial effusion.  Pt treated with IV pain meds, cardiology consult obtained- they have ordered echo and plan for admission to their service.          Ethelda Chick, MD 03/11/12 1606

## 2012-03-09 NOTE — ED Notes (Signed)
C/o mid-upper back pain progressively getting worse x 3 days L>R. Has had non-prod cough, nausea without emesis.  Denies injury, fever, chills, SOB. Pain worsens with mvmt & palpation, area tender to touch.  States taking vicodin for pain without relief. Was discharged few days ago.

## 2012-03-09 NOTE — ED Notes (Signed)
Patient transported to X-ray 

## 2012-03-10 DIAGNOSIS — I319 Disease of pericardium, unspecified: Principal | ICD-10-CM

## 2012-03-10 DIAGNOSIS — I309 Acute pericarditis, unspecified: Secondary | ICD-10-CM

## 2012-03-10 LAB — COMPREHENSIVE METABOLIC PANEL
ALT: 23 U/L (ref 0–35)
CO2: 29 mEq/L (ref 19–32)
Calcium: 9.8 mg/dL (ref 8.4–10.5)
Chloride: 96 mEq/L (ref 96–112)
GFR calc Af Amer: >90 mL/min (ref >90)
GFR calc non Af Amer: 81 mL/min — ABNORMAL LOW (ref >90)
Glucose, Bld: 97 mg/dL (ref 70–99)
Sodium: 136 mEq/L (ref 135–145)
Total Bilirubin: 0.8 mg/dL (ref 0.3–1.2)

## 2012-03-10 LAB — CARDIAC PANEL(CRET KIN+CKTOT+MB+TROPI): Troponin I: 0.4 ng/mL (ref <0.30)

## 2012-03-10 LAB — URINE CULTURE

## 2012-03-10 LAB — CBC
HCT: 34.7 % — ABNORMAL LOW (ref 36.0–46.0)
Hemoglobin: 10.3 g/dL — ABNORMAL LOW (ref 12.0–15.0)
MCH: 24.6 pg — ABNORMAL LOW (ref 26.0–34.0)
MCV: 83 fL (ref 78.0–100.0)
Platelets: 323 10*3/uL (ref 150–400)
RBC: 4.18 MIL/uL (ref 3.87–5.11)
WBC: 5.7 10*3/uL (ref 4.0–10.5)

## 2012-03-10 MED ORDER — ENOXAPARIN SODIUM 40 MG/0.4ML ~~LOC~~ SOLN
40.0000 mg | SUBCUTANEOUS | Status: DC
  Administered 2012-03-11: 40 mg via SUBCUTANEOUS
  Filled 2012-03-10: qty 0.4

## 2012-03-10 MED ORDER — PREDNISONE 50 MG PO TABS
50.0000 mg | ORAL_TABLET | Freq: Every day | ORAL | Status: DC
  Administered 2012-03-11: 50 mg via ORAL
  Filled 2012-03-10 (×2): qty 1

## 2012-03-10 NOTE — Progress Notes (Signed)
Subjective:  No chest pain or dyspnea. Breathing is greatly improved from her hospitalization last week. Still with back pain over the left mid-upper back.  Objective:  Vital Signs in the last 24 hours: Temp:  [97.6 F (36.4 C)-98.3 F (36.8 C)] 98.3 F (36.8 C) (06/11 0513) Pulse Rate:  [87-97] 91  (06/11 0513) Resp:  [17-29] 18  (06/11 0513) BP: (94-125)/(66-82) 104/73 mmHg (06/11 0513) SpO2:  [94 %-99 %] 94 % (06/11 0513) Weight:  [82.419 kg (181 lb 11.2 oz)-83.144 kg (183 lb 4.8 oz)] 82.419 kg (181 lb 11.2 oz) (06/11 0513)  Intake/Output from previous day:    Physical Exam: Pt is alert and oriented, pleasant obese woman in NAD HEENT: normal Neck: JVP - normal, carotids 2+= without bruits Lungs: decreased breath sounds left base, otherwise clear CV: RRR with grade 2/6 early systolic murmur at the LSB Abd: soft, NT, Positive BS, no hepatomegaly Ext: no C/C/E, distal pulses intact and equal Skin: warm/dry no rash   Lab Results:  [facility] 03/09/12 0829  WBC 7.3  HGB 10.7*  PLT 345    Basename 03/09/12 0829  NA 131*  K 4.0  CL 93*  CO2 23  GLUCOSE 105*  BUN 19  CREATININE 0.81    Basename 03/09/12 2217 03/09/12 1459  TROPONINI 0.35* 0.48*    Cardiac Studies:  2D Echo: Impressions:  - Normal LV size with severe LV hypertrophy that appears asymmetric (greater septal involvement). The patient does appear to have had an upper septal myectomy. EF is 30% with septal-lateral dyssynchrony and severe septal hypokinesis. LV outflow tract peak gradient is 25 mmHg. The mitral valve is calcified with no significant mitral regurgitation but probably mild to moderate mitral stenosis. The RV is poorly visualized. It appears normal in size with mild to moderate systolic dysfunction. There is a large circumferential pericardial effusion (largest laterally). It is hard to tell if early tamponade is present because the RV free wall is poorly visualized on this  study. There was not significant respirophasic variation of the mitral E inflow velocity. However, the IVC is dilated to 2.3 cm with minimal respirophasic variation. Therefore, cannot rule out early tamponade.  CXR: IMPRESSION:  1. Enlarged cardiopericardial silhouette, as before, consistent  with a pericardial effusion.  2. Bibasilar atelectasis with persistent volume loss in the medial  left lower lobe.  3. Tiny left pleural effusion.   Tele: sinus rhythm with rare PVC's  Assessment/Plan:  1. Pericardial effusion (post-operative) 2. Hypertrophic cardiomyopathy s/p septal myectomy, MV repair 3. Paroxysmal AF (post-op) 4. Left upper extremity DVT 5. Back pain - suspect musculoskeletal 6. 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 Elevated TSH  From a perspective of CHF, the patient is clinically much better than last week. Her BP is borderline and I don't think meds can be pushed any further right now. Main issue relates to her pericardial effusion. I have asked for a TCTS consultation for further evaluation and potential treatment. The patient does not appear to have hemodynamic compromise related to this, but I do worry about the implications of a large effusion in this patient with such significant underlying myocardial dysfunction. I think risk/benefit of continued anticoagulation, even in the setting of recent LUE DVT, is unfavorable and I have discontinued Pradaxa. Will await further recommendations from the surgical team. Regarding her elevated TSH, I'm not sure if she is hypothyroid or if she has 'euthyroid sick' syndrome. Will add free T4 with next blood  draw. Reviewed in detail with the patient.  Tonny Bollman, M.D. 03/10/2012, 7:31 AM

## 2012-03-10 NOTE — Consult Note (Signed)
Reason for Consult:Pericardial effusion Referring Physician: Dr. Verlin Fester Goding is an 41 y.o. female.  HPI: 41 yo WF s/p septal myomectomy and MVR a [facility] in late April of this year admitted with a cc/o chest pain. She has been feeling poorly for about 2-3 weeks. An echo a week ago showed a pericardial effusion. She presented with bilateral chest and upper back pain and low grade temps of 100.2. She also complains of swelling in her legs and feet.  Of note after her sugery she required an ICD and also had a left UE DVT requiring Pradaxa  A repeat echo showed an increase in the size of her effusion, no evidence of tamponade. Pain improved since admission.  Past Medical History  Diagnosis Date  . Hypertrophic cardiomyopathy     a) heart murmur age 45 but HOCM was eventually diagnosed during pregnancy. b) s/p septal myomectomy with implantable defibrillator 01/27/2012   . Anxiety state, unspecified   . Allergic rhinitis     to dogs  . Asthma   . History of anemia     during pregnancy  . History of chicken pox   . Generalized headaches   . Ocular migraine     no HA  . Back pain     told cracked bone, vicodin didn't help, improved with percocets/muscle relaxants, no eval by prior PCP  . 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 DVT (deep venous thrombosis)     Diagnosed 02/13/12  - was placed on Warfarin after cardiac surgery but had skin reaction to warfarin so was changed to Pradaxa so DVT occurred while on Pradaxa although unclear if it occurred during transition  . Hypotension   . Atrial fibrillation     a. On pradaxa.  coumadin necrosis with warfarin;  b. Amio d/c'd 02/2012  . ICD (implantable cardiac defibrillator) in place     02/04/2012, [facility]  . Acute on chronic combined systolic and diastolic congestive heart failure   . Pericardial effusion   . Chronic combined systolic and diastolic CHF (congestive heart  failure)     Past Surgical History  Procedure Date  . Cesarean section 1991  . Induced abortion 1990 and 1993  . US echocardiography 08/2011    severe LVH, severe asymmetric hypertrophy, EF 50-55%, grade 2 diastolic dysfunction  . Cardiac surgery 01/27/2012    median sternotomy, septal myectomy, MVR - [facility] (Dr. Silvestre Mesi)  . Cardiac defibrillator placement 01/2012  . Myomectomy     Family History  Problem Relation Age of Onset  . Diabetes Father   . Heart disease Father     unspecified heart problem  . Thyroid disease Mother   . Anemia Mother     mediterranean anemia, ITP  . Heart disease Maternal Grandfather     CHF  . Diabetes Paternal Grandfather   . Cancer Paternal Grandfather   . Coronary artery disease Neg Hx   . Stroke Neg Hx   . Sudden death Neg Hx     Social History:  reports that she quit smoking about 23 years ago. Her smoking use included Cigarettes. She has a 3 pack-year smoking history. She has never used smokeless tobacco. She reports that she drinks alcohol. She reports that she does not use illicit drugs.  Allergies:  Allergies  Allergen Reactions  . Warfarin And Related     Skin reaction    Medications:  Scheduled:   .  aspirin  81 mg Oral Daily  . colchicine  0.6 mg Oral BID  . enoxaparin (LOVENOX) injection  40 mg Subcutaneous Q24H  . fluticasone  1 puff Inhalation BID  . furosemide  40 mg Oral Daily  . loratadine  10 mg Oral Daily  . metoprolol succinate  50 mg Oral Daily  . sodium chloride  3 mL Intravenous Q12H    Results for orders placed during the hospital encounter of 03/09/12 (from the past 48 hour(s))  URINALYSIS, ROUTINE W REFLEX MICROSCOPIC     Status: Abnormal   Collection Time   03/09/12  7:52 AM      Component Value Range Comment   Color, Urine AMBER (*) YELLOW  BIOCHEMICALS MAY BE AFFECTED BY COLOR   APPearance CLOUDY (*) CLEAR     Specific Gravity, Urine 1.026  1.005 - 1.030     pH 6.0  5.0 - 8.0     Glucose, UA NEGATIVE   NEGATIVE (mg/dL)    Hgb urine dipstick SMALL (*) NEGATIVE     Bilirubin Urine SMALL (*) NEGATIVE     Ketones, ur NEGATIVE  NEGATIVE (mg/dL)    Protein, ur 191 (*) NEGATIVE (mg/dL)    Urobilinogen, UA 0.2  0.0 - 1.0 (mg/dL)    Nitrite NEGATIVE  NEGATIVE     Leukocytes, UA TRACE (*) NEGATIVE    URINE MICROSCOPIC-ADD ON     Status: Abnormal   Collection Time   03/09/12  7:52 AM      Component Value Range Comment   Squamous Epithelial / LPF MANY (*) RARE     WBC, UA 3-6  <3 (WBC/hpf)    RBC / HPF 3-6  <3 (RBC/hpf)    Bacteria, UA RARE  RARE     Crystals CA OXALATE CRYSTALS (*) NEGATIVE     Urine-Other MUCOUS PRESENT     URINE CULTURE     Status: Normal   Collection Time   03/09/12  7:52 AM      Component Value Range Comment   Specimen Description URINE, CLEAN CATCH      Special Requests ADD URNC [phone] [id number]      Culture  Setup Time [id number]      Colony Count 5,000 COLONIES/ML      Culture INSIGNIFICANT GROWTH      Report Status 03/10/2012 FINAL     CBC     Status: Abnormal   Collection Time   03/09/12  8:29 AM      Component Value Range Comment   WBC 7.3  4.0 - 10.5 (K/uL)    RBC 4.15  3.87 - 5.11 (MIL/uL)    Hemoglobin 10.7 (*) 12.0 - 15.0 (g/dL)    HCT 96.2 (*) 95.2 - 46.0 (%)    MCV 82.4  78.0 - 100.0 (fL)    MCH 25.8 (*) 26.0 - 34.0 (pg)    MCHC 31.3  30.0 - 36.0 (g/dL)    RDW 84.1  32.4 - 40.1 (%)    Platelets 345  150 - 400 (K/uL)   PRO B NATRIURETIC PEPTIDE     Status: Abnormal   Collection Time   03/09/12  8:29 AM      Component Value Range Comment   Pro B Natriuretic peptide (BNP) 2584.0 (*) 0 - 125 (pg/mL)   BASIC METABOLIC PANEL     Status: Abnormal   Collection Time   03/09/12  8:29 AM      Component Value Range Comment   Sodium 131 (*)  135 - 145 (mEq/L)    Potassium 4.0  3.5 - 5.1 (mEq/L) HEMOLYSIS AT THIS LEVEL MAY AFFECT RESULT   Chloride 93 (*) 96 - 112 (mEq/L)    CO2 23  19 - 32 (mEq/L)    Glucose, Bld 105 (*) 70 - 99 (mg/dL)    BUN 19  6 - 23  (mg/dL)    Creatinine, Ser 7.82  0.50 - 1.10 (mg/dL)    Calcium 9.5  8.4 - 10.5 (mg/dL)    GFR calc non Af Amer 89 (*) >90 (mL/min)    GFR calc Af Amer >90  >90 (mL/min)   POCT I-STAT TROPONIN I     Status: Normal   Collection Time   03/09/12  8:36 AM      Component Value Range Comment   Troponin i, poc 0.08  0.00 - 0.08 (ng/mL)    Comment 3            CARDIAC PANEL(CRET KIN+CKTOT+MB+TROPI)     Status: Abnormal   Collection Time   03/09/12  9:05 AM      Component Value Range Comment   Total CK 97  7 - 177 (U/L)    CK, MB 14.0 (*) 0.3 - 4.0 (ng/mL)    Troponin I <0.30  <0.30 (ng/mL)    Relative Index RELATIVE INDEX IS INVALID  0.0 - 2.5    D-DIMER, QUANTITATIVE     Status: Abnormal   Collection Time   03/09/12 12:15 PM      Component Value Range Comment   D-Dimer, Quant 2.08 (*) 0.00 - 0.48 (ug/mL-FEU)   CARDIAC PANEL(CRET KIN+CKTOT+MB+TROPI)     Status: Abnormal   Collection Time   03/09/12  2:59 PM      Component Value Range Comment   Total CK 81  7 - 177 (U/L)    CK, MB 13.8 (*) 0.3 - 4.0 (ng/mL) CRITICAL VALUE NOTED.  VALUE IS CONSISTENT WITH PREVIOUSLY REPORTED AND CALLED VALUE.   Troponin I 0.48 (*) <0.30 (ng/mL)    Relative Index RELATIVE INDEX IS INVALID  0.0 - 2.5    PROTIME-INR     Status: Abnormal   Collection Time   03/09/12  2:59 PM      Component Value Range Comment   Prothrombin Time 20.0 (*) 11.6 - 15.2 (seconds)    INR 1.67 (*) 0.00 - 1.49    APTT     Status: Abnormal   Collection Time   03/09/12  2:59 PM      Component Value Range Comment   aPTT 60 (*) 24 - 37 (seconds)   TSH     Status: Abnormal   Collection Time   03/09/12  2:59 PM      Component Value Range Comment   TSH 13.624 (*) 0.350 - 4.500 (uIU/mL)   MAGNESIUM     Status: Normal   Collection Time   03/09/12  2:59 PM      Component Value Range Comment   Magnesium 1.8  1.5 - 2.5 (mg/dL)   HEMOGLOBIN N5A     Status: Normal   Collection Time   03/09/12  2:59 PM      Component Value Range Comment    Hemoglobin A1C 5.6  <5.7 (%)    Mean Plasma Glucose 114  <117 (mg/dL)   CARDIAC PANEL(CRET KIN+CKTOT+MB+TROPI)     Status: Abnormal   Collection Time   03/09/12 10:17 PM      Component Value Range Comment   Total CK  86  7 - 177 (U/L)    CK, MB 14.6 (*) 0.3 - 4.0 (ng/mL) CRITICAL VALUE NOTED.  VALUE IS CONSISTENT WITH PREVIOUSLY REPORTED AND CALLED VALUE.   Troponin I 0.35 (*) <0.30 (ng/mL)    Relative Index RELATIVE INDEX IS INVALID  0.0 - 2.5    COMPREHENSIVE METABOLIC PANEL     Status: Abnormal   Collection Time   03/10/12  7:00 AM      Component Value Range Comment   Sodium 136  135 - 145 (mEq/L)    Potassium 3.8  3.5 - 5.1 (mEq/L)    Chloride 96  96 - 112 (mEq/L)    CO2 29  19 - 32 (mEq/L)    Glucose, Bld 97  70 - 99 (mg/dL)    BUN 19  6 - 23 (mg/dL)    Creatinine, Ser 9.60  0.50 - 1.10 (mg/dL)    Calcium 9.8  8.4 - 10.5 (mg/dL)    Total Protein 8.2  6.0 - 8.3 (g/dL)    Albumin 3.6  3.5 - 5.2 (g/dL)    AST 41 (*) 0 - 37 (U/L)    ALT 23  0 - 35 (U/L)    Alkaline Phosphatase 79  39 - 117 (U/L)    Total Bilirubin 0.8  0.3 - 1.2 (mg/dL)    GFR calc non Af Amer 81 (*) >90 (mL/min)    GFR calc Af Amer >90  >90 (mL/min)   CARDIAC PANEL(CRET KIN+CKTOT+MB+TROPI)     Status: Abnormal   Collection Time   03/10/12  7:00 AM      Component Value Range Comment   Total CK 77  7 - 177 (U/L)    CK, MB 14.6 (*) 0.3 - 4.0 (ng/mL) CRITICAL VALUE NOTED.  VALUE IS CONSISTENT WITH PREVIOUSLY REPORTED AND CALLED VALUE.   Troponin I 0.40 (*) <0.30 (ng/mL)    Relative Index RELATIVE INDEX IS INVALID  0.0 - 2.5    T4, FREE     Status: Normal   Collection Time   03/10/12  8:17 AM      Component Value Range Comment   Free T4 1.34  0.80 - 1.80 (ng/dL)   CBC     Status: Abnormal   Collection Time   03/10/12  8:17 AM      Component Value Range Comment   WBC 5.7  4.0 - 10.5 (K/uL)    RBC 4.18  3.87 - 5.11 (MIL/uL)    Hemoglobin 10.3 (*) 12.0 - 15.0 (g/dL)    HCT 45.4 (*) 09.8 - 46.0 (%)    MCV 83.0   78.0 - 100.0 (fL)    MCH 24.6 (*) 26.0 - 34.0 (pg)    MCHC 29.7 (*) 30.0 - 36.0 (g/dL)    RDW 11.9  14.7 - 82.9 (%)    Platelets 323  150 - 400 (K/uL)     Dg Chest 2 View  03/09/2012  *RADIOLOGY REPORT*  Clinical Data: Back pain.  CHEST - 2 VIEW  Comparison: 03/03/2012.  Findings: Trachea is midline. Cardiopericardial silhouette is enlarged, stable.  Pacemaker and AICD lead tips are stable in position. Epicardial pacer wires remain in place.  There may be a tiny left pleural effusion.  Minimal bibasilar atelectasis. Persistent volume loss in the medial left lower lobe.  No definite pneumothorax.  IMPRESSION:  1.  Enlarged cardiopericardial silhouette, as before, consistent with a pericardial effusion. 2.  Bibasilar atelectasis with persistent volume loss in the medial left lower lobe. 3.  Tiny left pleural effusion.  Original Report Authenticated By: Reyes Ivan, M.D.   Echocardiogram reviewed with Dr. Excell Seltzer  Review of Systems  Constitutional: Positive for fever (low grade) and malaise/fatigue. Negative for chills.  Respiratory: Positive for shortness of breath (mild).   Cardiovascular: Positive for chest pain (bilateral lower ribs L > R) and leg swelling.  Neurological: Negative.    Blood pressure 96/69, pulse 87, temperature 98.3 F (36.8 C), temperature source Oral, resp. rate 18, height 5' (1.524 m), weight 181 lb 11.2 oz (82.419 kg), last menstrual period 01/26/2012, SpO2 99.00%. Physical Exam  Vitals reviewed. Constitutional: She is oriented to person, place, and time. She appears well-developed and well-nourished. No distress.  HENT:  Head: Normocephalic and atraumatic.  Eyes: EOM are normal. Pupils are equal, round, and reactive to light.  Neck: Neck supple.  Cardiovascular: Normal rate and regular rhythm.  Exam reveals friction rub.   Murmur heard. Respiratory: Effort normal and breath sounds normal. She has no wheezes. She has no rales.       Sternotomy incision well  healed  GI: Soft. There is no tenderness.  Musculoskeletal: She exhibits edema.  Lymphadenopathy:    She has no cervical adenopathy.  Neurological: She is alert and oriented to person, place, and time.  Skin: Skin is warm and dry.    Assessment/Plan: 41 yo with pericardial effusion 6 weeks after cardiac surgery and 5 weeks post ICD placement.  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  Options are 1. Window  2. Treatment with antiinflammatories (either NSAIDs or Prednisone).  Discussed with patient and Dr. Excell Seltzer. Will try a course of prednisone, as she is very reluctant to consider another operation. She may ultimately require a window, and certainly will if symptoms worsen or she develops signs of tamponade.  Angeliz Settlemyre C 03/10/2012, 5:27 PM

## 2012-03-10 NOTE — Care Management Note (Unsigned)
    Page 1 of 1   03/10/2012     4:14:53 PM   CARE MANAGEMENT NOTE 03/10/2012  Patient:  [name],[name]   Account Number:  [id number]  Date Initiated:  03/10/2012  Documentation initiated by:  Aulani Shipton  Subjective/Objective Assessment:   PT ADM WITH CHEST PAIN AT REST SECONDARY TO PERICARDIAL EFFUSION.  PTA, PT INDEPENDENT, LIVES ALONE.     Action/Plan:   TCTS TO EVALUATE.  WILL FOLLOW FOR DISCHARGE NEEDS.   Anticipated DC Date:  03/13/2012   Anticipated DC Plan:  HOME W HOME HEALTH SERVICES      DC Planning Services  CM consult      Choice offered to / List presented to:             Status of service:  In process, will continue to follow Medicare Important Message given?   (If response is "NO", the following Medicare IM given date fields will be blank) Date Medicare IM given:   Date Additional Medicare IM given:    Discharge Disposition:    Per UR Regulation:  Reviewed for med. necessity/level of care/duration of stay  If discussed at Long Length of Stay Meetings, dates discussed:    Comments:

## 2012-03-10 NOTE — Progress Notes (Signed)
Pharmacist Heart Failure Core Measure Documentation  Assessment: [name] has an EF documented as 30% on 03/09/12 by echo.  Rationale: Heart failure patients with left ventricular systolic dysfunction (LVSD) and an EF < 40% should be prescribed an angiotensin converting enzyme inhibitor (ACEI) or angiotensin receptor blocker (ARB) at discharge unless a contraindication is documented in the medical record.  This patient is not currently on an ACEI or ARB for HF.  This note is being placed in the record in order to provide documentation that a contraindication to the use of these agents is present for this encounter.  ACE Inhibitor or Angiotensin Receptor Blocker is contraindicated (specify all that apply)  []   ACEI allergy AND ARB allergy []   Angioedema []   Moderate or severe aortic stenosis []   Hyperkalemia [x]   Hypotension []   Renal artery stenosis []   Worsening renal function, preexisting renal disease or dysfunction   Severiano Gilbert 03/10/2012 3:09 PM

## 2012-03-11 ENCOUNTER — Other Ambulatory Visit (HOSPITAL_COMMUNITY): Payer: Self-pay | Admitting: Cardiovascular Disease

## 2012-03-11 DIAGNOSIS — I970 Postcardiotomy syndrome: Secondary | ICD-10-CM

## 2012-03-11 DIAGNOSIS — I313 Pericardial effusion (noninflammatory): Secondary | ICD-10-CM

## 2012-03-11 LAB — BASIC METABOLIC PANEL
BUN: 16 mg/dL (ref 6–23)
CO2: 29 mEq/L (ref 19–32)
Calcium: 9.7 mg/dL (ref 8.4–10.5)
Chloride: 94 mEq/L — ABNORMAL LOW (ref 96–112)
Creatinine, Ser: 0.79 mg/dL (ref 0.50–1.10)
Glucose, Bld: 106 mg/dL — ABNORMAL HIGH (ref 70–99)

## 2012-03-11 MED ORDER — PREDNISONE 10 MG PO TABS: ORAL_TABLET | ORAL | Status: DC

## 2012-03-11 MED ORDER — POTASSIUM CHLORIDE CRYS ER 20 MEQ PO TBCR
40.0000 meq | EXTENDED_RELEASE_TABLET | Freq: Once | ORAL | Status: AC
  Administered 2012-03-11: 40 meq via ORAL
  Filled 2012-03-11: qty 2

## 2012-03-11 NOTE — Discharge Summary (Signed)
Patient ID: [name],  MRN: [id number], DOB/AGE: October 26, 1970 41 y.o.  Admit date: 03/09/2012 Discharge date: 03/11/2012  Primary Care Provider: Eustaquio Boyden Primary Cardiologist: Judie Petit. Excell Seltzer, MD  Discharge Diagnoses Principal Problem:  *Post pericardiotomy syndrome   Active Problems:  Pericardial effusion  **Large by echo this admission -> managed with oral steroids.  Hypertrophic cardiomyopathy  Chronic combined systolic and diastolic CHF (congestive heart failure)  Chest pain at rest  Deep venous thrombosis of upper extremity  **S/P 6 wks of pradaxa therapy - now discontinued in setting of large pericardial effusion.  Postoperative atrial fibrillation  Allergies Allergies  Allergen Reactions  . Warfarin And Related     Skin reaction   Procedures  2D Echocardiogram Study Conclusions  - Left ventricle: The cavity size was normal. Severe LV   hypertrophy, somewhat asymmetrically involving the septum.   The patient appears to be status post upper septal   myectomy. Peak LV outflow tract gradient 25 mmHg. There is   septal-lateral dyssynchrony and the septum appears   severely hypokinetic. The estimated ejection fraction was   30%. Indeterminant diastolic function. - Aortic valve: There was no stenosis. Trivial   regurgitation. - Mitral valve: Mildly calcified annulus. Moderately   calcified leaflets . At least mild, possibly moderate   mitral stenosis. No significant regurgitation. Mean   gradient: 7mm Hg (D). - Left atrium: The atrium was moderately dilated. - Right ventricle: Poorly visualized. The cavity size was   normal. Pacer wire or catheter noted in right ventricle.   Systolic function was mildly to moderately reduced. - Right atrium: Poorly visualized. - Pulmonary arteries: No complete TR doppler jet so unable   to estimate PA systolic pressure. - Systemic veins: IVC measured 2.3 cm with no respirophasic   variation, suggesting RA pressure 23 mmHg. -  Pericardium, extracardiac: Large circumferential   pericardial effusion. The effusion is most extensive   laterally. _____________  History of Present Illness  41 y/o female with h/o HOCM s/p relatively recent septal myomectomy and mitral valve repair @ [facility] in April of this year. Her post-op course was complicated by VT, afib, and left upper extremity DVT. For these reasons, she was placed on amiodarone, treated with an ICD, and also initiated on Coumadin therapy. Unfortunately, she developed a necrotic skin reaction from coumadin and this was switched to Pradaxa.  Due to dyspnea and volume overload, she was admitted to [facility] on 03/02/2012 and found to have a moderate to large pericardial perfusion.  She was later found to reduced LV function with an EF of 30% by echo.  It was felt that her presentation was consistent with post-pericardiotomy syndrome and following diuresis, she was placed on asa and colchicine therapy.  Pradaxa therapy was continued for ongoing mgmt of upper extremity DVT.  She was discharged home on June 6th, however developed worsening pleuritic chest pain and tenderness as well as low grade fevers and presented back to the [facility] ED on June 10th.  There, ECG was non-acute.  She was admitted for further evaluation.  Hospital Course  Following admission, pt cont to have pleuritic chest pain.  Repeat 2D echo was performed and revealed a large pericardial perfusion.  Due to progression of effusion, pradaxa was discontinued (she had completed roughly 6 wks of therapy).  Imaging was reviewed and CT surgery was consulted.  It was felt that pt would benefit from either initiation of steroids or pericardial window.  Decision was made to place pt on steroids for  the time being and we will f/u an echo next week as previously scheduled.  Of note, Pts volume status was stable throughout admission.  Her EF remains down @ approximately 30%.  Also, TSH was elevated at 13.624 however Free T4 was  normal @ 1.34.  It was felt that this most likely represent sick-euthyroid.  Discharge Vitals Blood pressure 97/70, pulse 86, temperature 97.3 F (36.3 C), temperature source Oral, resp. rate 19, height 5' (1.524 m), weight 182 lb 1.6 oz (82.6 kg), last menstrual period 01/26/2012, SpO2 94.00%.  Filed Weights   03/09/12 1428 03/10/12 0513 03/11/12 0419  Weight: 183 lb 4.8 oz (83.144 kg) 181 lb 11.2 oz (82.419 kg) 182 lb 1.6 oz (82.6 kg)   Labs  CBC  Basename 03/10/12 0817 03/09/12 0829  WBC 5.7 7.3  NEUTROABS -- --  HGB 10.3* 10.7*  HCT 34.7* 34.2*  MCV 83.0 82.4  PLT 323 345   Basic Metabolic Panel  Basename 03/11/12 0500 03/10/12 0700 03/09/12 1459  NA 135 136 --  K 3.2* 3.8 --  CL 94* 96 --  CO2 29 29 --  GLUCOSE 106* 97 --  BUN 16 19 --  CREATININE 0.79 0.88 --  CALCIUM 9.7 9.8 --  MG -- -- 1.8  PHOS -- -- --   Liver Function Tests  Basename 03/10/12 0700  AST 41*  ALT 23  ALKPHOS 79  BILITOT 0.8  PROT 8.2  ALBUMIN 3.6   Cardiac Enzymes  Basename 03/10/12 0700 03/09/12 2217 03/09/12 1459  CKTOTAL 77 86 81  CKMB 14.6* 14.6* 13.8*  CKMBINDEX -- -- --  TROPONINI 0.40* 0.35* 0.48*   D-Dimer  Basename 03/09/12 1215  DDIMER 2.08*   Hemoglobin A1C  Basename 03/09/12 1459  HGBA1C 5.6    Thyroid Function Tests  Basename 03/09/12 1459  TSH 13.624*  T4TOTAL --  T3FREE --  THYROIDAB --  Free T4 1.34  Disposition  Pt is being discharged home today in good condition.  Follow-up Plans & Appointments  Follow-up Information    Follow up with Tereso Newcomer, PA on 03/20/2012. (8:50 AM)    Contact information:   [address] [place] [address] [phone]       Follow up with [facility] on 03/16/2012. (9:30 AM)    Contact information:   [address] [place] [phone]       Discharge Medications  Medication List  As of 03/11/2012 12:20 PM   STOP taking these medications          dabigatran 150 MG Caps         TAKE these medications         aspirin 81 MG chewable tablet   Chew 81 mg by mouth daily.      beclomethasone 80 MCG/ACT inhaler   Commonly known as: QVAR   Inhale 1 puff into the lungs 2 (two) times daily.      cetirizine 10 MG tablet   Commonly known as: ZYRTEC   Take 10 mg by mouth daily.      colchicine 0.6 MG tablet   Take 1 tablet (0.6 mg total) by mouth 2 (two) times daily.      furosemide 40 MG tablet   Commonly known as: LASIX   Take 40 mg by mouth daily.      HYDROcodone-acetaminophen 5-500 MG per tablet   Commonly known as: VICODIN   Take 1 tablet by mouth 3 (three) times daily  as needed.      metoprolol succinate 50 MG 24 hr tablet   Commonly known as: TOPROL-XL   Take 50 mg by mouth daily. Take with or immediately following a meal.      ondansetron 4 MG tablet   Commonly known as: ZOFRAN   Take 1 tablet (4 mg total) by mouth 3 (three) times daily as needed.      predniSONE 10 MG tablet   Commonly known as: DELTASONE   5 Tabs daily x 1 more day, then 4 tabs daily x 2 days, then 3 tabs daily x 2 days, then 2 tabs daily x 2 days, then 1 tab daily.          Outstanding Labs/Studies  2D echo and bmet on 6/17  Duration of Discharge Encounter   Greater than 30 minutes including physician time.  Signed, Nicolasa Ducking NP 03/11/2012, 12:20 PM

## 2012-03-11 NOTE — Discharge Instructions (Signed)
***  PLEASE REMEMBER TO BRING ALL OF YOUR MEDICATIONS TO EACH OF YOUR FOLLOW-UP OFFICE VISITS.  

## 2012-03-11 NOTE — Progress Notes (Signed)
    Subjective:  No chest pain or dyspnea. No back pain. Complains of mild swelling in her feet.  Objective:  Vital Signs in the last 24 hours: Temp:  [97.3 F (36.3 C)-98.4 F (36.9 C)] 97.3 F (36.3 C) (06/12 0419) Pulse Rate:  [86-94] 86  (06/12 0419) Resp:  [18-19] 19  (06/12 0419) BP: (95-97)/(68-70) 97/70 mmHg (06/12 0419) SpO2:  [90 %-99 %] 94 % (06/12 0741) Weight:  [82.6 kg (182 lb 1.6 oz)] 82.6 kg (182 lb 1.6 oz) (06/12 0419)  Intake/Output from previous day: 06/11 0701 - 06/12 0700 In: 420 [P.O.:420] Out: -   Physical Exam: Pt is alert and oriented, NAD HEENT: normal Neck: JVP - normal, carotids 2+= without bruits Lungs: CTA bilaterally CV: RRR with grade 2/6 short systolic ejection murmur at the left sternal border Abd: soft, NT, Positive BS, no hepatomegaly Ext: Trace bilateral pretibial edema, distal pulses intact and equal Skin: warm/dry no rash   Lab Results:  Basename 03/10/12 0817 03/09/12 0829  WBC 5.7 7.3  HGB 10.3* 10.7*  PLT 323 345    Basename 03/11/12 0500 03/10/12 0700  NA 135 136  K 3.2* 3.8  CL 94* 96  CO2 29 29  GLUCOSE 106* 97  BUN 16 19  CREATININE 0.79 0.88    Basename 03/10/12 0700 03/09/12 2217  TROPONINI 0.40* 0.35*   Tele: personally reviewed. Sinus rhythm  Assessment/Plan:  1. Postoperative pericardial effusion. The patient is now off of anticoagulation. Will start her on a tapering course of prednisone for treatment of post pericardiotomy syndrome. She remains on colchicine. Recommend prednisone 50 mg for 2 days, 40 mg for 2 days, 30 mg for 2 days, 20 mg for 2 days, then 10 mg daily. She should have an echocardiogram in approximately one week as well as a metabolic panel. She has followup scheduled June 22 and I have recommended that she keep this appointment. She will call back if she develops shortness of breath, chest pain, or any other alarming symptoms.  2. Hypertrophic cardiomyopathy status post septal myectomy and  mitral valve repair  3. Paroxysmal atrial fibrillation, postoperative. She remains in sinus rhythm. The patient is off of anticoagulation because of her pericardial effusion.  4. Left upper extremity DVT. She is at about 6 weeks of pradaxa and this will be discontinued  5. hypokalemia. Replete potassium. Likely related to diuretic therapy.   6. Thyroid abnormalities. The patient's TSH was elevated. A free T4 is been checked and it was within normal limits. I suspect euthyroid sick syndrome.  7. Disposition. Recommend discharge today. The patient should followup as scheduled in the office on June 22. She should have a limited study echocardiogram to reassess her effusion in one week as well as a metabolic panel in one week.  Tonny Bollman, M.D. 03/11/2012, 8:32 AM

## 2012-03-12 NOTE — Discharge Summary (Signed)
Agree with above. See my progress note the same date. 

## 2012-03-13 ENCOUNTER — Inpatient Hospital Stay (HOSPITAL_COMMUNITY)
Admission: EM | Admit: 2012-03-13 | Discharge: 2012-03-20 | DRG: 237 | Disposition: A | Discharge status: Home / Self Care | Payer: 59 | Attending: Cardiovascular Disease | Admitting: Cardiovascular Disease

## 2012-03-13 ENCOUNTER — Telehealth: Payer: Self-pay | Admitting: Family Medicine

## 2012-03-13 ENCOUNTER — Encounter: Payer: Self-pay | Admitting: Family Medicine

## 2012-03-13 ENCOUNTER — Ambulatory Visit (INDEPENDENT_AMBULATORY_CARE_PROVIDER_SITE_OTHER): Payer: 59 | Admitting: Family Medicine

## 2012-03-13 ENCOUNTER — Encounter (HOSPITAL_COMMUNITY): Payer: Self-pay | Admitting: Physical Medicine and Rehabilitation

## 2012-03-13 ENCOUNTER — Emergency Department (HOSPITAL_COMMUNITY): Payer: 59

## 2012-03-13 VITALS — BP 104/70 | HR 94 | Temp 97.4°F | Wt 186.0 lb

## 2012-03-13 DIAGNOSIS — I447 Left bundle-branch block, unspecified: Secondary | ICD-10-CM | POA: Diagnosis present

## 2012-03-13 DIAGNOSIS — Z9889 Other specified postprocedural states: Secondary | ICD-10-CM

## 2012-03-13 DIAGNOSIS — J45909 Unspecified asthma, uncomplicated: Secondary | ICD-10-CM | POA: Diagnosis present

## 2012-03-13 DIAGNOSIS — I4891 Unspecified atrial fibrillation: Secondary | ICD-10-CM | POA: Diagnosis present

## 2012-03-13 DIAGNOSIS — I313 Pericardial effusion (noninflammatory): Secondary | ICD-10-CM

## 2012-03-13 DIAGNOSIS — M549 Dorsalgia, unspecified: Secondary | ICD-10-CM

## 2012-03-13 DIAGNOSIS — I82409 Acute embolism and thrombosis of unspecified deep veins of unspecified lower extremity: Secondary | ICD-10-CM

## 2012-03-13 DIAGNOSIS — F411 Generalized anxiety disorder: Secondary | ICD-10-CM | POA: Diagnosis present

## 2012-03-13 DIAGNOSIS — Z87891 Personal history of nicotine dependence: Secondary | ICD-10-CM

## 2012-03-13 DIAGNOSIS — I5043 Acute on chronic combined systolic (congestive) and diastolic (congestive) heart failure: Secondary | ICD-10-CM | POA: Diagnosis present

## 2012-03-13 DIAGNOSIS — I509 Heart failure, unspecified: Secondary | ICD-10-CM

## 2012-03-13 DIAGNOSIS — I312 Hemopericardium, not elsewhere classified: Principal | ICD-10-CM | POA: Diagnosis present

## 2012-03-13 DIAGNOSIS — I3139 Other pericardial effusion (noninflammatory): Secondary | ICD-10-CM | POA: Diagnosis present

## 2012-03-13 DIAGNOSIS — N390 Urinary tract infection, site not specified: Secondary | ICD-10-CM | POA: Diagnosis not present

## 2012-03-13 DIAGNOSIS — I2510 Atherosclerotic heart disease of native coronary artery without angina pectoris: Secondary | ICD-10-CM | POA: Diagnosis present

## 2012-03-13 DIAGNOSIS — Z7982 Long term (current) use of aspirin: Secondary | ICD-10-CM

## 2012-03-13 DIAGNOSIS — Z7901 Long term (current) use of anticoagulants: Secondary | ICD-10-CM

## 2012-03-13 DIAGNOSIS — Z86718 Personal history of other venous thrombosis and embolism: Secondary | ICD-10-CM

## 2012-03-13 DIAGNOSIS — Z888 Allergy status to other drugs, medicaments and biological substances status: Secondary | ICD-10-CM

## 2012-03-13 DIAGNOSIS — Z951 Presence of aortocoronary bypass graft: Secondary | ICD-10-CM

## 2012-03-13 DIAGNOSIS — Z95810 Presence of automatic (implantable) cardiac defibrillator: Secondary | ICD-10-CM

## 2012-03-13 DIAGNOSIS — R300 Dysuria: Secondary | ICD-10-CM | POA: Diagnosis present

## 2012-03-13 DIAGNOSIS — I314 Cardiac tamponade: Secondary | ICD-10-CM | POA: Diagnosis present

## 2012-03-13 DIAGNOSIS — I422 Other hypertrophic cardiomyopathy: Secondary | ICD-10-CM | POA: Diagnosis present

## 2012-03-13 DIAGNOSIS — I959 Hypotension, unspecified: Secondary | ICD-10-CM | POA: Diagnosis present

## 2012-03-13 DIAGNOSIS — Z8249 Family history of ischemic heart disease and other diseases of the circulatory system: Secondary | ICD-10-CM

## 2012-03-13 HISTORY — DX: Cardiac tamponade: I31.4

## 2012-03-13 LAB — CBC
HCT: 34.2 % — ABNORMAL LOW (ref 36.0–46.0)
Hemoglobin: 10.4 g/dL — ABNORMAL LOW (ref 12.0–15.0)
MCH: 25.1 pg — ABNORMAL LOW (ref 26.0–34.0)
MCHC: 30.4 g/dL (ref 30.0–36.0)
RBC: 4.15 MIL/uL (ref 3.87–5.11)

## 2012-03-13 LAB — COMPREHENSIVE METABOLIC PANEL
ALT: 26 U/L (ref 0–35)
Alkaline Phosphatase: 87 U/L (ref 39–117)
BUN: 20 mg/dL (ref 6–23)
CO2: 23 mEq/L (ref 19–32)
Calcium: 9.4 mg/dL (ref 8.4–10.5)
GFR calc Af Amer: >90 mL/min (ref >90)
GFR calc non Af Amer: >90 mL/min (ref >90)
Glucose, Bld: 151 mg/dL — ABNORMAL HIGH (ref 70–99)
Potassium: 3.9 mEq/L (ref 3.5–5.1)
Sodium: 134 mEq/L — ABNORMAL LOW (ref 135–145)
Total Protein: 8 g/dL (ref 6.0–8.3)

## 2012-03-13 LAB — URINALYSIS, ROUTINE W REFLEX MICROSCOPIC
Glucose, UA: NEGATIVE mg/dL
Hgb urine dipstick: NEGATIVE
Ketones, ur: NEGATIVE mg/dL
Leukocytes, UA: NEGATIVE
Protein, ur: NEGATIVE mg/dL
pH: 5.5 (ref 5.0–8.0)

## 2012-03-13 LAB — POCT I-STAT TROPONIN I: Troponin i, poc: 0.06 ng/mL (ref 0.00–0.08)

## 2012-03-13 MED ORDER — FUROSEMIDE 10 MG/ML IJ SOLN
80.0000 mg | Freq: Once | INTRAMUSCULAR | Status: AC
  Administered 2012-03-13: 80 mg via INTRAVENOUS
  Filled 2012-03-13: qty 8

## 2012-03-13 MED ORDER — NITROGLYCERIN 2 % TD OINT
0.5000 [in_us] | TOPICAL_OINTMENT | Freq: Once | TRANSDERMAL | Status: AC
  Administered 2012-03-13: 0.5 [in_us] via TOPICAL
  Filled 2012-03-13: qty 1

## 2012-03-13 NOTE — Telephone Encounter (Signed)
Seeing Dr. Algis Downs.  Recent hospitalization with d/c 03/11/2012, dx post-pericardiotomy syndrome, and post surgery pericardial effusion.  In hospital CVTS/cards were discussing pericardial window, but instead discharged with medical therapy - oral steroids.

## 2012-03-13 NOTE — Telephone Encounter (Signed)
Will see patient today

## 2012-03-13 NOTE — Assessment & Plan Note (Addendum)
W/o rib fracture seen on last CXR.  Advised patient to go to ER.  I d/w Dr. Patty Sermons about the edema and pain.  At this point, I am uncomfortable increasing her lasix and pain meds w/o more information (ie rechecking renal function) and the only way to arrange this currently is via ER.  I called charge at [facility] about pending ER arrival by car.  Patient has a driver and is appears reasonable for transport via car.  I will await ED MD and potentially cards/cvts input.  App help of all involved.  >25 min spent with face to face with patient, >50% counseling and/or coordinating care.

## 2012-03-13 NOTE — Telephone Encounter (Signed)
Caller: [name]/Patient; PCP: Eustaquio Boyden; CB#: [phone]; Still Having Back Pain,  ED 03/09/12, for back pain, (was told inflammation around her heart) states is mostly rib cage area on left side; used Dilaudid IV for the pain, lasted one hr to 2 hrs; states cardiologist felt was muscle  pain due to arms being tight following heart surgery, states using Hydrocodone 5/325 (2) every 4 hrs in the hosp, but states it did not relieve the pain. States has never been seen in the office for the back pain, appt advised. LMP 01/26/12, using BC which she stopped before surgery ( states GYN aware). Symptoms reviewed Back Symptoms Guideline, with continued pain, pt agrees to appt, same scheduled 1615 03/13/12 with Dr Para March.

## 2012-03-13 NOTE — ED Notes (Signed)
Cardiology at bedside.

## 2012-03-13 NOTE — ED Provider Notes (Addendum)
History     CSN: [id number]  Arrival date & time 03/13/12  1750   First MD Initiated Contact with Patient 03/13/12 2115      Chief Complaint  Patient presents with  . Shortness of Breath    HPI Pt has history of hypertrophic cardiomyopathy.  She had surgery back in April.  Since that time she has been having trouble off and on with her breathing.  Pt was recently in the hospital for trouble for CHF.  She was discharged on Wednesday.  Pt went to see her primary care doctor for pain medications associated with her back pain.  Her doctor spoke with her cardiologist who told her to come to the ED.  She continues to have trouble with dyspnea on exertion.  She feels very short of breath when lying flat.  She has had some fluid gain in her legs. Past Medical History  Diagnosis Date  . Hypertrophic cardiomyopathy     a) heart murmur age 62 but HOCM was eventually diagnosed during pregnancy. b) s/p septal myomectomy with implantable defibrillator 01/27/2012   . Anxiety state, unspecified   . Allergic rhinitis     to dogs  . Asthma   . History of anemia     during pregnancy  . History of chicken pox   . Generalized headaches   . Ocular migraine     no HA  . Back pain     told cracked bone, vicodin didn't help, improved with percocets/muscle relaxants, no eval by prior PCP  . 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 DVT (deep venous thrombosis)     Diagnosed 02/13/12  - was placed on Warfarin after cardiac surgery but had skin reaction to warfarin so was changed to Pradaxa so DVT occurred while on Pradaxa although unclear if it occurred during transition  . Hypotension   . Atrial fibrillation     a. On pradaxa.  coumadin necrosis with warfarin;  b. Amio d/c'd 02/2012  . ICD (implantable cardiac defibrillator) in place     02/04/2012, [employer]  . Acute on chronic combined systolic and diastolic congestive heart failure   .  Pericardial effusion   . Chronic combined systolic and diastolic CHF (congestive heart failure)     Past Surgical History  Procedure Date  . Cesarean section 1991  . Induced abortion 1990 and 1993  . US echocardiography 08/2011    severe LVH, severe asymmetric hypertrophy, EF 50-55%, grade 2 diastolic dysfunction  . Cardiac surgery 01/27/2012    median sternotomy, septal myectomy, MVR - [facility] (Dr. Silvestre Mesi)  . Cardiac defibrillator placement 01/2012  . Myomectomy     Family History  Problem Relation Age of Onset  . Diabetes Father   . Heart disease Father     unspecified heart problem  . Thyroid disease Mother   . Anemia Mother     mediterranean anemia, ITP  . Heart disease Maternal Grandfather     CHF  . Diabetes Paternal Grandfather   . Cancer Paternal Grandfather   . Coronary artery disease Neg Hx   . Stroke Neg Hx   . Sudden death Neg Hx     History  Substance Use Topics  . Smoking status: Former Smoker -- 1.0 packs/day for 3 years    Types: Cigarettes    Quit date: 09/30/1988  . Smokeless tobacco: Never Used   Comment: quitin 1990  . Alcohol Use:  0.0 oz/week     rarely drinks wine    OB History    Grav Para Term Preterm Abortions TAB SAB Ect Mult Living                  Review of Systems  Cardiovascular: Negative for chest pain.  Musculoskeletal: Positive for back pain.  All other systems reviewed and are negative.    Allergies  Warfarin and related  Home Medications   Current Outpatient Rx  Name Route Sig Dispense Refill  . ASPIRIN 81 MG PO CHEW Oral Chew 81 mg by mouth daily.    . BECLOMETHASONE DIPROPIONATE 80 MCG/ACT IN AERS Inhalation Inhale 1 puff into the lungs 2 (two) times daily.    Marland Kitchen CETIRIZINE HCL 10 MG PO TABS Oral Take 10 mg by mouth daily.      . COLCHICINE 0.6 MG PO TABS Oral Take 1 tablet (0.6 mg total) by mouth 2 (two) times daily. 60 tablet 1  . FUROSEMIDE 40 MG PO TABS Oral Take 40 mg by mouth daily.    Marland Kitchen  HYDROCODONE-ACETAMINOPHEN 5-500 MG PO TABS Oral Take 1 tablet by mouth 3 (three) times daily as needed. 20 tablet 0  . METOPROLOL SUCCINATE ER 50 MG PO TB24 Oral Take 50 mg by mouth daily. Take with or immediately following a meal.    . ONDANSETRON HCL 4 MG PO TABS Oral Take 4 mg by mouth 3 (three) times daily as needed. For nausea and vomiting    . PREDNISONE 10 MG PO TABS  5 Tabs daily x 1 more day, then 4 tabs daily x 2 days, then 3 tabs daily x 2 days, then 2 tabs daily x 2 days, then 1 tab daily. 60 tablet 1    BP 99/73  Pulse 94  Temp 99 F (37.2 C) (Oral)  Resp 18  SpO2 95%  LMP 01/26/2012  Physical Exam  Nursing note and vitals reviewed. Constitutional: She appears well-developed and well-nourished. No distress.  HENT:  Head: Normocephalic and atraumatic.  Right Ear: External ear normal.  Left Ear: External ear normal.  Eyes: Conjunctivae are normal. Right eye exhibits no discharge. Left eye exhibits no discharge. No scleral icterus.  Neck: Neck supple. No tracheal deviation present.  Cardiovascular: Normal rate, regular rhythm and intact distal pulses.   Pulmonary/Chest: Effort normal. No stridor. No respiratory distress. She has no wheezes. She has rales (bilateral basilar crackles).       Well-healed midline sternotomy scar  Abdominal: Soft. Bowel sounds are normal. She exhibits no distension. There is no tenderness. There is no rebound and no guarding.  Musculoskeletal: She exhibits edema (bilateral pitting edema). She exhibits no tenderness.  Neurological: She is alert. She has normal strength. No sensory deficit. Cranial nerve deficit:  no gross defecits noted. She exhibits normal muscle tone. She displays no seizure activity. Coordination normal.  Skin: Skin is warm and dry. No rash noted.  Psychiatric: She has a normal mood and affect.    ED Course  Procedures (including critical care time)  Rate: 90  Rhythm: normal sinus rhythm  QRS Axis: Left  Intervals:  normal  ST/T Wave abnormalities: normal  Conduction Disutrbances: Left bundle branch block  Narrative Interpretation: Abnormal but unchanged  Old EKG Reviewed: No significant changes  Labs Reviewed  PRO B NATRIURETIC PEPTIDE - Abnormal; Notable for the following:    Pro B Natriuretic peptide (BNP) 3938.0 (*)     All other components within normal limits  CBC -  Abnormal; Notable for the following:    WBC 12.6 (*)     Hemoglobin 10.4 (*)     HCT 34.2 (*)     MCH 25.1 (*)     RDW 15.9 (*)     All other components within normal limits  COMPREHENSIVE METABOLIC PANEL - Abnormal; Notable for the following:    Sodium 134 (*)     Glucose, Bld 151 (*)     All other components within normal limits  URINALYSIS, ROUTINE W REFLEX MICROSCOPIC   Dg Chest 2 View  03/13/2012  *RADIOLOGY REPORT*  Clinical Data: Shortness of breath.  CHEST - 2 VIEW  Comparison: Chest x-ray 03/09/2012.  Findings: Lung volumes are within normal limits.  There is a retrocardiac opacity, favored to represent subsegmental atelectasis.  No definite focal airspace consolidation.  Mild pulmonary venous congestion without frank pulmonary edema.  The cardiopericardial silhouette is moderate - severely enlarged (similar to priors).  Mediastinal contours are unremarkable.  There is a left-sided pacemaker/AICD in place with lead tips projecting over the expected location of the right atrial appendage, right ventricular apex, and posteriorly within the azygos circulation. Epicardial pacing wires remain in place.  IMPRESSION: 1.  Persistent enlargement of the cardiopericardial silhouette again suggestive of underlying cardiomegaly and the presence of a pericardial effusion. 2.  Pulmonary venous congestion without frank pulmonary edema. 3.  Unusual placement of pacemaker/AICD leads redemonstrated, as above.  Original Report Authenticated By: Florencia Reasons, M.D.     1. Congestive heart failure   2. Pericardial effusion       MDM   The patient presents with worsening shortness of breath. She was recently in the hospital for congestive heart failure. Clinically pt appears to have a recurrent episode/exacerbation.  CXR also suggest persistent enlargement of the cardiac silhouette suggesting pericardial effusion.  IV lasix and NTG have been ordered. Will consult the cardiology service for further evaluation and admission.      Celene Kras, MD 03/13/12 2245

## 2012-03-13 NOTE — ED Notes (Signed)
Pt c/o SOB since this morning. States she was discharged from hospital Wednesday following CHF exacerbation. Pt denies chest pain. Attempt for IV x 2 unsuccessful. Another RN will attempt at this time

## 2012-03-13 NOTE — Progress Notes (Signed)
Recent history: 41 y/o female with h/o HOCM s/p relatively recent septal myomectomy and mitral valve repair @ [facility] in April of this year. Her post-op course was complicated by VT, afib, and left upper extremity DVT. For these reasons, she was placed on amiodarone, treated with an ICD, and also initiated on Coumadin therapy. Unfortunately, she developed a necrotic skin reaction from coumadin and this was switched to Pradaxa. Due to dyspnea and volume overload, she was admitted to [facility] on 03/02/2012 and found to have a moderate to large pericardial perfusion. She was later found to reduced LV function with an EF of 30% by echo. It was felt that her presentation was consistent with post-pericardiotomy syndrome and following diuresis, she was placed on asa and colchicine therapy. Pradaxa therapy was continued for ongoing mgmt of upper extremity DVT. She was discharged home on June 6th, however developed worsening pleuritic chest pain and tenderness as well as low grade fevers and presented back to the [facility] ED on June 10th. There, ECG was non-acute. She was admitted for further evaluation.   Following admission, pt cont to have pleuritic chest pain. Repeat 2D echo was performed and revealed a large pericardial perfusion. Due to progression of effusion, pradaxa was discontinued (she had completed roughly 6 wks of therapy). Imaging was reviewed and CT surgery was consulted. It was felt that pt would benefit from either initiation of steroids or pericardial window. Decision was made to place pt on steroids for the time being and we will f/u an echo next week as previously scheduled.   Of note, Pts volume status was stable throughout admission. Her EF remains down @ approximately 30%. Also, TSH was elevated at 13.624 however Free T4 was normal @ 1.34. It was felt that this most likely represent sick-euthyroid  Since discharge- over last 48 hours- she continue to have L upper back pain.  Pain wasn't better with  hydrocodone.  Weight increased 4 lbs in last 2 days.  Is still on prednisone and colchicine.  Slightly more SOB than she was in the hospital.  SOB is worse when trying to lay supine.    Meds, vitals, and allergies reviewed.   ROS: See HPI.  Otherwise, noncontributory.  Alert and oriented.  Mmm rrr Slightly dec BS at the bases abd soft, +BS L upper back ttp w/o rash Necrotic lesion noted on L forearm 1+ edema to BLE

## 2012-03-13 NOTE — ED Notes (Addendum)
Pt presents to department for evaluation of SOB. History of CHF. States breathing became worse today, was recently discharged from hospital on Wednesday. Denies chest pain at the time. Respirations unlabored. Skin warm and dry. Pt states she becomes very short of breath with walking. Pt conscious alert and oriented x4. Pt also states chronic back pain, rating 8/10 upon arrival to ED.

## 2012-03-13 NOTE — Patient Instructions (Addendum)
Go to the ER at [facility].  I'll call them to let them know you are coming.

## 2012-03-14 ENCOUNTER — Encounter (HOSPITAL_COMMUNITY)
Admission: EM | Disposition: A | Discharge status: Home / Self Care | Payer: Self-pay | Source: Home / Self Care | Attending: Cardiovascular Disease

## 2012-03-14 ENCOUNTER — Encounter (HOSPITAL_COMMUNITY): Payer: Self-pay | Admitting: Anesthesiology

## 2012-03-14 ENCOUNTER — Encounter (HOSPITAL_COMMUNITY): Payer: Self-pay | Admitting: Cardiovascular Disease

## 2012-03-14 ENCOUNTER — Inpatient Hospital Stay (HOSPITAL_COMMUNITY): Payer: 59 | Admitting: Anesthesiology

## 2012-03-14 ENCOUNTER — Encounter (HOSPITAL_COMMUNITY): Payer: Self-pay | Admitting: Thoracic Surgery (Cardiothoracic Vascular Surgery)

## 2012-03-14 ENCOUNTER — Inpatient Hospital Stay (HOSPITAL_COMMUNITY): Payer: 59

## 2012-03-14 DIAGNOSIS — R0602 Shortness of breath: Secondary | ICD-10-CM

## 2012-03-14 DIAGNOSIS — I314 Cardiac tamponade: Secondary | ICD-10-CM

## 2012-03-14 HISTORY — DX: Cardiac tamponade: I31.4

## 2012-03-14 HISTORY — PX: PERICARDIAL WINDOW: SHX2213

## 2012-03-14 LAB — GLUCOSE, CAPILLARY
Glucose-Capillary: 88 mg/dL (ref 70–99)
Glucose-Capillary: 77 mg/dL (ref 70–99)

## 2012-03-14 LAB — PROTIME-INR: Prothrombin Time: 17.7 seconds — ABNORMAL HIGH (ref 11.6–15.2)

## 2012-03-14 LAB — POCT I-STAT 3, ART BLOOD GAS (G3+)
Bicarbonate: 29.9 mEq/L — ABNORMAL HIGH (ref 20.0–24.0)
Patient temperature: 97.6
TCO2: 31 mmol/L (ref 0–100)
TCO2: 32 mmol/L (ref 0–100)
pCO2 arterial: 49.7 mmHg — ABNORMAL HIGH (ref 35.0–45.0)
pH, Arterial: 7.435 — ABNORMAL HIGH (ref 7.350–7.400)
pH, Arterial: 7.398 (ref 7.350–7.400)

## 2012-03-14 LAB — POCT I-STAT 4, (NA,K, GLUC, HGB,HCT)
Potassium: 3.2 mEq/L — ABNORMAL LOW (ref 3.5–5.1)
Sodium: 139 mEq/L (ref 135–145)

## 2012-03-14 LAB — BASIC METABOLIC PANEL
CO2: 27 mEq/L (ref 19–32)
Chloride: 97 mEq/L (ref 96–112)
Creatinine, Ser: 0.79 mg/dL (ref 0.50–1.10)
Potassium: 3.5 mEq/L (ref 3.5–5.1)

## 2012-03-14 LAB — BODY FLUID CELL COUNT WITH DIFFERENTIAL
Eos, Fluid: 2 %
Other Cells, Fluid: 2 %

## 2012-03-14 LAB — PREGNANCY, URINE: Preg Test, Ur: NEGATIVE

## 2012-03-14 LAB — CBC
HCT: 33.7 % — ABNORMAL LOW (ref 36.0–46.0)
Hemoglobin: 10.2 g/dL — ABNORMAL LOW (ref 12.0–15.0)
Hemoglobin: 9 g/dL — ABNORMAL LOW (ref 12.0–15.0)
MCV: 81.6 fL (ref 78.0–100.0)
RBC: 4.13 MIL/uL (ref 3.87–5.11)
RBC: 3.55 MIL/uL — ABNORMAL LOW (ref 3.87–5.11)
RDW: 15.9 % — ABNORMAL HIGH (ref 11.5–15.5)
WBC: 11.1 10*3/uL — ABNORMAL HIGH (ref 4.0–10.5)

## 2012-03-14 LAB — APTT
aPTT: 27 seconds (ref 24–37)
aPTT: 28 seconds (ref 24–37)

## 2012-03-14 LAB — ABO/RH: ABO/RH(D): O POS

## 2012-03-14 SURGERY — CREATION, PERICARDIAL WINDOW
Anesthesia: General | Site: Chest | Wound class: Clean

## 2012-03-14 MED ORDER — SODIUM CHLORIDE 0.9 % IJ SOLN: 3.0000 mL | Freq: Two times a day (BID) | INTRAMUSCULAR | Status: DC

## 2012-03-14 MED ORDER — MIDAZOLAM HCL 2 MG/2ML IJ SOLN
INTRAMUSCULAR | Status: AC
  Filled 2012-03-14: qty 2

## 2012-03-14 MED ORDER — SODIUM CHLORIDE 0.9 % IV SOLN
0.4000 ug/kg/h | INTRAVENOUS | Status: DC
  Filled 2012-03-14: qty 2

## 2012-03-14 MED ORDER — BISACODYL 5 MG PO TBEC
10.0000 mg | DELAYED_RELEASE_TABLET | Freq: Every day | ORAL | Status: DC
  Administered 2012-03-15 – 2012-03-19 (×3): 10 mg via ORAL
  Filled 2012-03-14 (×2): qty 2

## 2012-03-14 MED ORDER — DOCUSATE SODIUM 100 MG PO CAPS
200.0000 mg | ORAL_CAPSULE | Freq: Every day | ORAL | Status: DC
  Administered 2012-03-15 – 2012-03-19 (×3): 200 mg via ORAL
  Filled 2012-03-14 (×6): qty 2

## 2012-03-14 MED ORDER — MIDAZOLAM HCL 5 MG/ML IJ SOLN
INTRAMUSCULAR | Status: DC | PRN
  Administered 2012-03-14: 4 mg via INTRAVENOUS

## 2012-03-14 MED ORDER — ACETAMINOPHEN 500 MG PO TABS
1000.0000 mg | ORAL_TABLET | Freq: Four times a day (QID) | ORAL | Status: AC
  Administered 2012-03-15 – 2012-03-19 (×14): 1000 mg via ORAL
  Filled 2012-03-14 (×16): qty 2

## 2012-03-14 MED ORDER — FLUTICASONE PROPIONATE HFA 44 MCG/ACT IN AERO
1.0000 | INHALATION_SPRAY | Freq: Two times a day (BID) | RESPIRATORY_TRACT | Status: DC
  Filled 2012-03-14: qty 10.6

## 2012-03-14 MED ORDER — METOPROLOL TARTRATE 25 MG/10 ML ORAL SUSPENSION
12.5000 mg | Freq: Two times a day (BID) | ORAL | Status: DC
  Filled 2012-03-14 (×4): qty 5

## 2012-03-14 MED ORDER — DEXTROSE 5 % IV SOLN
1.5000 g | INTRAVENOUS | Status: DC | PRN
  Administered 2012-03-14: 1.5 g via INTRAVENOUS

## 2012-03-14 MED ORDER — ROCURONIUM BROMIDE 100 MG/10ML IV SOLN
INTRAVENOUS | Status: DC | PRN
  Administered 2012-03-14 (×2): 30 mg via INTRAVENOUS
  Administered 2012-03-14: 10 mg via INTRAVENOUS

## 2012-03-14 MED ORDER — SUCCINYLCHOLINE CHLORIDE 20 MG/ML IJ SOLN
INTRAMUSCULAR | Status: DC | PRN
  Administered 2012-03-14: 120 mg via INTRAVENOUS

## 2012-03-14 MED ORDER — MAGNESIUM SULFATE 40 MG/ML IJ SOLN
INTRAMUSCULAR | Status: AC
  Administered 2012-03-14: 4 g via INTRAVENOUS
  Filled 2012-03-14: qty 100

## 2012-03-14 MED ORDER — PREDNISONE 20 MG PO TABS
40.0000 mg | ORAL_TABLET | Freq: Every day | ORAL | Status: DC
  Filled 2012-03-14 (×2): qty 2

## 2012-03-14 MED ORDER — SODIUM CHLORIDE 0.9 % IV SOLN
INTRAVENOUS | Status: DC
  Administered 2012-03-14: 10:00:00 via INTRAVENOUS

## 2012-03-14 MED ORDER — ACETAMINOPHEN 160 MG/5ML PO SOLN: 650.0000 mg | ORAL | Status: AC

## 2012-03-14 MED ORDER — COLCHICINE 0.6 MG PO TABS
0.6000 mg | ORAL_TABLET | ORAL | Status: AC
  Administered 2012-03-14: 0.6 mg via ORAL
  Filled 2012-03-14: qty 1

## 2012-03-14 MED ORDER — MIDAZOLAM HCL 5 MG/5ML IJ SOLN
INTRAMUSCULAR | Status: DC | PRN
  Administered 2012-03-14: 2 mg via INTRAVENOUS

## 2012-03-14 MED ORDER — DEXTROSE 5 % IV SOLN
1.5000 g | Freq: Two times a day (BID) | INTRAVENOUS | Status: AC
  Administered 2012-03-14 – 2012-03-16 (×4): 1.5 g via INTRAVENOUS
  Filled 2012-03-14 (×4): qty 1.5

## 2012-03-14 MED ORDER — PHENYLEPHRINE HCL 10 MG/ML IJ SOLN
0.0000 ug/min | INTRAVENOUS | Status: DC
  Filled 2012-03-14: qty 2

## 2012-03-14 MED ORDER — SODIUM CHLORIDE 0.9 % IV SOLN: 250.0000 mL | INTRAVENOUS | Status: DC

## 2012-03-14 MED ORDER — INSULIN REGULAR BOLUS VIA INFUSION
0.0000 [IU] | Freq: Three times a day (TID) | INTRAVENOUS | Status: DC
  Filled 2012-03-14: qty 10

## 2012-03-14 MED ORDER — METOPROLOL TARTRATE 12.5 MG HALF TABLET
12.5000 mg | ORAL_TABLET | Freq: Two times a day (BID) | ORAL | Status: DC
  Administered 2012-03-15 – 2012-03-16 (×3): 12.5 mg via ORAL
  Filled 2012-03-14 (×6): qty 1

## 2012-03-14 MED ORDER — SODIUM CHLORIDE 0.9 % IJ SOLN: 3.0000 mL | INTRAMUSCULAR | Status: DC | PRN

## 2012-03-14 MED ORDER — LACTATED RINGERS IV SOLN
INTRAVENOUS | Status: DC | PRN
  Administered 2012-03-14: 07:00:00 via INTRAVENOUS

## 2012-03-14 MED ORDER — MORPHINE SULFATE 2 MG/ML IJ SOLN
1.0000 mg | INTRAMUSCULAR | Status: AC | PRN
  Administered 2012-03-14: 2 mg via INTRAVENOUS
  Filled 2012-03-14: qty 1

## 2012-03-14 MED ORDER — MORPHINE SULFATE 2 MG/ML IJ SOLN
2.0000 mg | INTRAMUSCULAR | Status: DC | PRN
  Administered 2012-03-14 (×2): 2 mg via INTRAVENOUS
  Administered 2012-03-14: 4 mg via INTRAVENOUS
  Administered 2012-03-14: 2 mg via INTRAVENOUS
  Administered 2012-03-15: 4 mg via INTRAVENOUS
  Administered 2012-03-15: 2 mg via INTRAVENOUS
  Administered 2012-03-15 (×2): 4 mg via INTRAVENOUS
  Filled 2012-03-14 (×4): qty 1
  Filled 2012-03-14 (×4): qty 2
  Filled 2012-03-14: qty 1

## 2012-03-14 MED ORDER — DOBUTAMINE IN D5W 4-5 MG/ML-% IV SOLN
2.5000 ug/kg/min | INTRAVENOUS | Status: DC
  Filled 2012-03-14: qty 250

## 2012-03-14 MED ORDER — MAGNESIUM SULFATE 40 MG/ML IJ SOLN
4.0000 g | Freq: Once | INTRAMUSCULAR | Status: AC
  Administered 2012-03-14: 4 g via INTRAVENOUS

## 2012-03-14 MED ORDER — ASPIRIN 81 MG PO CHEW: 324.0000 mg | CHEWABLE_TABLET | Freq: Every day | ORAL | Status: DC

## 2012-03-14 MED ORDER — LACTATED RINGERS IV SOLN: 500.0000 mL | Freq: Once | INTRAVENOUS | Status: AC | PRN

## 2012-03-14 MED ORDER — ALBUMIN HUMAN 5 % IV SOLN: 250.0000 mL | INTRAVENOUS | Status: DC | PRN

## 2012-03-14 MED ORDER — METOPROLOL TARTRATE 1 MG/ML IV SOLN: 2.5000 mg | INTRAVENOUS | Status: DC | PRN

## 2012-03-14 MED ORDER — PANTOPRAZOLE SODIUM 40 MG PO TBEC
40.0000 mg | DELAYED_RELEASE_TABLET | Freq: Every day | ORAL | Status: DC
  Administered 2012-03-16 – 2012-03-20 (×5): 40 mg via ORAL
  Filled 2012-03-14 (×5): qty 1

## 2012-03-14 MED ORDER — ONDANSETRON HCL 4 MG/2ML IJ SOLN
4.0000 mg | Freq: Four times a day (QID) | INTRAMUSCULAR | Status: DC | PRN
  Administered 2012-03-14: 4 mg via INTRAVENOUS
  Filled 2012-03-14: qty 2

## 2012-03-14 MED ORDER — OXYCODONE HCL 5 MG PO TABS
5.0000 mg | ORAL_TABLET | ORAL | Status: DC | PRN
  Administered 2012-03-15 – 2012-03-17 (×10): 5 mg via ORAL
  Administered 2012-03-18: 10 mg via ORAL
  Filled 2012-03-14 (×3): qty 1
  Filled 2012-03-14: qty 2
  Filled 2012-03-14 (×8): qty 1

## 2012-03-14 MED ORDER — VANCOMYCIN HCL IN DEXTROSE 1-5 GM/200ML-% IV SOLN
1000.0000 mg | Freq: Once | INTRAVENOUS | Status: AC
  Administered 2012-03-14: 1000 mg via INTRAVENOUS
  Filled 2012-03-14: qty 200

## 2012-03-14 MED ORDER — ASPIRIN EC 325 MG PO TBEC
325.0000 mg | DELAYED_RELEASE_TABLET | Freq: Every day | ORAL | Status: DC
  Administered 2012-03-15 – 2012-03-20 (×6): 325 mg via ORAL
  Filled 2012-03-14 (×6): qty 1

## 2012-03-14 MED ORDER — CEFUROXIME SODIUM 1.5 G IJ SOLR
INTRAMUSCULAR | Status: AC
  Filled 2012-03-14: qty 1.5

## 2012-03-14 MED ORDER — ACETAMINOPHEN 160 MG/5ML PO SOLN: 975.0000 mg | Freq: Four times a day (QID) | ORAL | Status: DC

## 2012-03-14 MED ORDER — DEXTROSE 5 % IV SOLN
INTRAVENOUS | Status: AC
  Filled 2012-03-14: qty 50

## 2012-03-14 MED ORDER — SODIUM CHLORIDE 0.9 % IV SOLN
0.1000 ug/kg/h | INTRAVENOUS | Status: DC
  Administered 2012-03-14: 0.7 ug/kg/h via INTRAVENOUS
  Filled 2012-03-14 (×2): qty 2

## 2012-03-14 MED ORDER — POTASSIUM CHLORIDE 10 MEQ/50ML IV SOLN
10.0000 meq | INTRAVENOUS | Status: AC
  Administered 2012-03-14 (×3): 10 meq via INTRAVENOUS

## 2012-03-14 MED ORDER — SODIUM CHLORIDE 0.9 % IV SOLN: 250.0000 mL | INTRAVENOUS | Status: DC | PRN

## 2012-03-14 MED ORDER — LIDOCAINE HCL (CARDIAC) 20 MG/ML IV SOLN
INTRAVENOUS | Status: AC
  Filled 2012-03-14: qty 5

## 2012-03-14 MED ORDER — SODIUM CHLORIDE 0.45 % IV SOLN: INTRAVENOUS | Status: DC

## 2012-03-14 MED ORDER — FAMOTIDINE IN NACL 20-0.9 MG/50ML-% IV SOLN
20.0000 mg | Freq: Two times a day (BID) | INTRAVENOUS | Status: AC
  Administered 2012-03-14 – 2012-03-15 (×2): 20 mg via INTRAVENOUS
  Filled 2012-03-14 (×2): qty 50

## 2012-03-14 MED ORDER — ONDANSETRON HCL 4 MG/2ML IJ SOLN
4.0000 mg | Freq: Four times a day (QID) | INTRAMUSCULAR | Status: DC | PRN
  Administered 2012-03-18 – 2012-03-19 (×2): 4 mg via INTRAVENOUS
  Filled 2012-03-14 (×2): qty 2

## 2012-03-14 MED ORDER — EPINEPHRINE HCL 1 MG/ML IJ SOLN
0.5000 ug/min | INTRAVENOUS | Status: DC
  Filled 2012-03-14: qty 1

## 2012-03-14 MED ORDER — LIDOCAINE HCL (CARDIAC) 20 MG/ML IV SOLN
INTRAVENOUS | Status: DC | PRN
  Administered 2012-03-14: 100 mg via INTRAVENOUS

## 2012-03-14 MED ORDER — MIDAZOLAM HCL 2 MG/2ML IJ SOLN
2.0000 mg | INTRAMUSCULAR | Status: DC | PRN
  Administered 2012-03-14: 2 mg via INTRAVENOUS

## 2012-03-14 MED ORDER — LACTATED RINGERS IV SOLN
INTRAVENOUS | Status: DC | PRN
  Administered 2012-03-14 (×2): via INTRAVENOUS

## 2012-03-14 MED ORDER — NITROGLYCERIN IN D5W 200-5 MCG/ML-% IV SOLN: 0.0000 ug/min | INTRAVENOUS | Status: DC

## 2012-03-14 MED ORDER — COLCHICINE 0.6 MG PO TABS
0.6000 mg | ORAL_TABLET | Freq: Two times a day (BID) | ORAL | Status: DC
  Filled 2012-03-14 (×2): qty 1

## 2012-03-14 MED ORDER — BISACODYL 10 MG RE SUPP: 10.0000 mg | Freq: Every day | RECTAL | Status: DC

## 2012-03-14 MED ORDER — KETOROLAC TROMETHAMINE 30 MG/ML IJ SOLN
30.0000 mg | Freq: Once | INTRAMUSCULAR | Status: AC | PRN
  Administered 2012-03-14: 30 mg via INTRAVENOUS
  Filled 2012-03-14: qty 1

## 2012-03-14 MED ORDER — ACETAMINOPHEN 325 MG PO TABS: 650.0000 mg | ORAL_TABLET | ORAL | Status: DC | PRN

## 2012-03-14 MED ORDER — ACETAMINOPHEN 650 MG RE SUPP
650.0000 mg | RECTAL | Status: AC
  Administered 2012-03-14: 650 mg via RECTAL
  Filled 2012-03-14: qty 1

## 2012-03-14 MED ORDER — FENTANYL CITRATE 0.05 MG/ML IJ SOLN
INTRAMUSCULAR | Status: DC | PRN
  Administered 2012-03-14: 50 ug via INTRAVENOUS
  Administered 2012-03-14: 100 ug via INTRAVENOUS
  Administered 2012-03-14 (×2): 50 ug via INTRAVENOUS

## 2012-03-14 MED ORDER — LACTATED RINGERS IV SOLN: INTRAVENOUS | Status: DC

## 2012-03-14 MED ORDER — INSULIN REGULAR HUMAN 100 UNIT/ML IJ SOLN
INTRAMUSCULAR | Status: DC
  Filled 2012-03-14: qty 1

## 2012-03-14 MED ORDER — SODIUM CHLORIDE 0.9 % IV SOLN
200.0000 ug | INTRAVENOUS | Status: DC | PRN
  Administered 2012-03-14: .7 ug/kg/h via INTRAVENOUS

## 2012-03-14 MED ORDER — LORATADINE 10 MG PO TABS
10.0000 mg | ORAL_TABLET | Freq: Every day | ORAL | Status: DC
  Filled 2012-03-14: qty 1

## 2012-03-14 SURGICAL SUPPLY — 52 items
APL SKNCLS STERI-STRIP NONHPOA (GAUZE/BANDAGES/DRESSINGS) ×1
ATTRACTOMAT 16X20 MAGNETIC DRP (DRAPES) ×2 IMPLANT
BENZOIN TINCTURE PRP APPL 2/3 (GAUZE/BANDAGES/DRESSINGS) ×2 IMPLANT
BLADE CORE FAN STRYKER (BLADE) ×2 IMPLANT
CANISTER SUCTION 2500CC (MISCELLANEOUS) ×2 IMPLANT
CATH THORACIC 28FR (CATHETERS) IMPLANT
CATH THORACIC 28FR RT ANG (CATHETERS) IMPLANT
CATH THORACIC 36FR (CATHETERS) IMPLANT
CATH THORACIC 36FR RT ANG (CATHETERS) IMPLANT
CLOTH BEACON ORANGE TIMEOUT ST (SAFETY) ×2 IMPLANT
CONN ST 1/4X3/8  BEN (MISCELLANEOUS) ×1
CONN ST 1/4X3/8 BEN (MISCELLANEOUS) ×1 IMPLANT
CONT SPEC 4OZ CLIKSEAL STRL BL (MISCELLANEOUS) IMPLANT
COVER SURGICAL LIGHT HANDLE (MISCELLANEOUS) ×4 IMPLANT
DRAIN CHANNEL 28F RND 3/8 FF (WOUND CARE) ×2 IMPLANT
DRAIN CHANNEL 32F RND 10.7 FF (WOUND CARE) ×2 IMPLANT
DRAPE LAPAROSCOPIC ABDOMINAL (DRAPES) IMPLANT
ELECT BLADE 4.0 EZ CLEAN MEGAD (MISCELLANEOUS) ×2
ELECT REM PT RETURN 9FT ADLT (ELECTROSURGICAL) ×2
ELECTRODE BLDE 4.0 EZ CLN MEGD (MISCELLANEOUS) ×1 IMPLANT
ELECTRODE REM PT RTRN 9FT ADLT (ELECTROSURGICAL) ×1 IMPLANT
GLOVE ORTHO TXT STRL SZ7.5 (GLOVE) ×4 IMPLANT
GOWN PREVENTION PLUS XLARGE (GOWN DISPOSABLE) ×2 IMPLANT
HEMOSTAT POWDER SURGIFOAM 1G (HEMOSTASIS) IMPLANT
KIT BASIN OR (CUSTOM PROCEDURE TRAY) ×2 IMPLANT
KIT ROOM TURNOVER OR (KITS) ×2 IMPLANT
NS IRRIG 1000ML POUR BTL (IV SOLUTION) ×2 IMPLANT
PACK CHEST (CUSTOM PROCEDURE TRAY) IMPLANT
PACK OPEN HEART (CUSTOM PROCEDURE TRAY) ×2 IMPLANT
PAD ARMBOARD 7.5X6 YLW CONV (MISCELLANEOUS) ×4 IMPLANT
PAD ELECT DEFIB RADIOL ZOLL (MISCELLANEOUS) ×2 IMPLANT
SPONGE GAUZE 4X4 12PLY (GAUZE/BANDAGES/DRESSINGS) IMPLANT
STAPLER VISISTAT 35W (STAPLE) ×2 IMPLANT
STRIP CLOSURE SKIN 1/2X4 (GAUZE/BANDAGES/DRESSINGS) ×2 IMPLANT
SUT MNCRL AB 3-0 PS2 18 (SUTURE) ×4 IMPLANT
SUT PDS AB 1 CTX 36 (SUTURE) ×2 IMPLANT
SUT PROLENE 3 0 SH DA (SUTURE) ×2 IMPLANT
SUT VIC AB 1 CTX 18 (SUTURE) ×2 IMPLANT
SUT VIC AB 2-0 CT1 36 (SUTURE) ×2 IMPLANT
SUTURE E-PAK OPEN HEART (SUTURE) ×2 IMPLANT
SWAB COLLECTION DEVICE MRSA (MISCELLANEOUS) IMPLANT
SYR 50ML SLIP (SYRINGE) IMPLANT
SYSTEM SAHARA CHEST DRAIN ATS (WOUND CARE) IMPLANT
TAPE STRIPS DRAPE STRL (GAUZE/BANDAGES/DRESSINGS) ×2 IMPLANT
TOWEL OR 17X24 6PK STRL BLUE (TOWEL DISPOSABLE) ×2 IMPLANT
TOWEL OR 17X26 10 PK STRL BLUE (TOWEL DISPOSABLE) ×2 IMPLANT
TRAP SPECIMEN MUCOUS 40CC (MISCELLANEOUS) ×6 IMPLANT
TRAY FOLEY CATH 14FRSI W/METER (CATHETERS) ×2 IMPLANT
TUBE ANAEROBIC SPECIMEN COL (MISCELLANEOUS) IMPLANT
TUBE CONNECTING 12X1/4 (SUCTIONS) ×2 IMPLANT
WATER STERILE IRR 1000ML POUR (IV SOLUTION) ×4 IMPLANT
YANKAUER SUCT BULB TIP NO VENT (SUCTIONS) ×2 IMPLANT

## 2012-03-14 NOTE — H&P (Signed)
Chief Complaint: Shortness of breath HPI: [name] is an 41 y.o. female with HOCM s/p recent myomectomy at [facility] which was complicated by AFib, VT requiring ICD placement, drop in EF from normal to 30%, upper extremity DVT, skin necrosis on warfarin and more recently post-pericardectomy syndrome with pericardial effuson which is being treated conservatively with steroids and colchcine. She was admitted to the hospital when she came in complaining of back pain that worsens with laying supine and improves when she leans forward. She was diagnosed with the effusion at the time but eventually discharged home with an early follow up. Her pradaxa was stopped as well. She went to her primary care doctor today for pain meds and was noticed to be more short of breath and so sent to the hospital. She is currently having SOB with very minimal exertion, PND, orthopnea, leg edema. In the ER, her BP was consistently greater than systolic of , her heart rate was sinus in the 90s. She was in obvious distress. A quick 2D echo revealed limited imaging that showed a large pericardial effusion with the largest pocket greater than 2cm and early signs of diastolic compression of the RVOT. It appears that the effusion needs to be drained. When I informed her that I was going to call in the interventional team to put in a drain, she instantly stated that she want family to be with her when this happens and so refused. I explained to her that even though things appear relatively  stable at this time, she could have sudden hemodynamic collapse. I also advised her to observe  strict bed rest for now.    Past Medical History  Diagnosis Date  . Hypertrophic cardiomyopathy     a) heart murmur age 12 but HOCM was eventually diagnosed during pregnancy. b) s/p septal myomectomy with implantable defibrillator 01/27/2012   . Anxiety state, unspecified   . Allergic rhinitis     to dogs  . Asthma   . History of anemia    during pregnancy  . History of chicken pox   . Generalized headaches   . Ocular migraine     no HA  . Back pain     told cracked bone, vicodin didn't help, improved with percocets/muscle relaxants, no eval by prior PCP  . 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 DVT (deep venous thrombosis)     Diagnosed 02/13/12  - was placed on Warfarin after cardiac surgery but had skin reaction to warfarin so was changed to Pradaxa so DVT occurred while on Pradaxa although unclear if it occurred during transition  . Hypotension   . Atrial fibrillation     a. On pradaxa.  coumadin necrosis with warfarin;  b. Amio d/c'd 02/2012  . ICD (implantable cardiac defibrillator) in place     02/04/2012, [employer]  . Acute on chronic combined systolic and diastolic congestive heart failure   . Pericardial effusion   . Chronic combined systolic and diastolic CHF (congestive heart failure)     Past Surgical History  Procedure Date  . Cesarean section 1991  . Induced abortion 1990 and 1993  . US echocardiography 08/2011    severe LVH, severe asymmetric hypertrophy, EF 50-55%, grade 2 diastolic dysfunction  . Cardiac surgery 01/27/2012    median sternotomy, septal myectomy, MVR - [facility] (Dr. Silvestre Mesi)  . Cardiac defibrillator placement 01/2012  . Myomectomy     Family History  Problem Relation Age of Onset  . Diabetes Father   . Heart disease Father     unspecified heart problem  . Thyroid disease Mother   . Anemia Mother     mediterranean anemia, ITP  . Heart disease Maternal Grandfather     CHF  . Diabetes Paternal Grandfather   . Cancer Paternal Grandfather   . Coronary artery disease Neg Hx   . Stroke Neg Hx   . Sudden death Neg Hx    Social History:  reports that she quit smoking about 23 years ago. Her smoking use included Cigarettes. She has a 3 pack-year smoking history. She has never used smokeless tobacco. She reports that she drinks  alcohol. She reports that she does not use illicit drugs.  Allergies:  Allergies  Allergen Reactions  . Warfarin And Related     Skin reaction     (Not in a hospital admission)  Results for orders placed during the hospital encounter of 03/13/12 (from the past 48 hour(s))  PRO B NATRIURETIC PEPTIDE     Status: Abnormal   Collection Time   03/13/12  7:11 PM      Component Value Range Comment   Pro B Natriuretic peptide (BNP) 3938.0 (*) 0 - 125 pg/mL   CBC     Status: Abnormal   Collection Time   03/13/12  7:11 PM      Component Value Range Comment   WBC 12.6 (*) 4.0 - 10.5 K/uL    RBC 4.15  3.87 - 5.11 MIL/uL    Hemoglobin 10.4 (*) 12.0 - 15.0 g/dL    HCT 86.5 (*) 78.4 - 46.0 %    MCV 82.4  78.0 - 100.0 fL    MCH 25.1 (*) 26.0 - 34.0 pg    MCHC 30.4  30.0 - 36.0 g/dL    RDW 69.6 (*) 29.5 - 15.5 %    Platelets 372  150 - 400 K/uL   COMPREHENSIVE METABOLIC PANEL     Status: Abnormal   Collection Time   03/13/12  7:11 PM      Component Value Range Comment   Sodium 134 (*) 135 - 145 mEq/L    Potassium 3.9  3.5 - 5.1 mEq/L    Chloride 96  96 - 112 mEq/L    CO2 23  19 - 32 mEq/L    Glucose, Bld 151 (*) 70 - 99 mg/dL    BUN 20  6 - 23 mg/dL    Creatinine, Ser 2.84  0.50 - 1.10 mg/dL    Calcium 9.4  8.4 - 13.2 mg/dL    Total Protein 8.0  6.0 - 8.3 g/dL    Albumin 3.7  3.5 - 5.2 g/dL    AST 35  0 - 37 U/L    ALT 26  0 - 35 U/L    Alkaline Phosphatase 87  39 - 117 U/L    Total Bilirubin 0.5  0.3 - 1.2 mg/dL    GFR calc non Af Amer >90  >90 mL/min    GFR calc Af Amer >90  >90 mL/min   URINALYSIS, ROUTINE W REFLEX MICROSCOPIC     Status: Normal   Collection Time   03/13/12  8:22 PM      Component Value Range Comment   Color, Urine YELLOW  YELLOW    APPearance CLEAR  CLEAR    Specific Gravity, Urine 1.018  1.005 - 1.030    pH 5.5  5.0 - 8.0  Glucose, UA NEGATIVE  NEGATIVE mg/dL    Hgb urine dipstick NEGATIVE  NEGATIVE    Bilirubin Urine NEGATIVE  NEGATIVE    Ketones, ur  NEGATIVE  NEGATIVE mg/dL    Protein, ur NEGATIVE  NEGATIVE mg/dL    Urobilinogen, UA 0.2  0.0 - 1.0 mg/dL    Nitrite NEGATIVE  NEGATIVE    Leukocytes, UA NEGATIVE  NEGATIVE MICROSCOPIC NOT DONE ON URINES WITH NEGATIVE PROTEIN, BLOOD, LEUKOCYTES, NITRITE, OR GLUCOSE <1000 mg/dL.  POCT I-STAT TROPONIN I     Status: Normal   Collection Time   03/13/12 10:43 PM      Component Value Range Comment   Troponin i, poc 0.06  0.00 - 0.08 ng/mL    Comment 3             Dg Chest 2 View  03/13/2012  *RADIOLOGY REPORT*  Clinical Data: Shortness of breath.  CHEST - 2 VIEW  Comparison: Chest x-ray 03/09/2012.  Findings: Lung volumes are within normal limits.  There is a retrocardiac opacity, favored to represent subsegmental atelectasis.  No definite focal airspace consolidation.  Mild pulmonary venous congestion without frank pulmonary edema.  The cardiopericardial silhouette is moderate - severely enlarged (similar to priors).  Mediastinal contours are unremarkable.  There is a left-sided pacemaker/AICD in place with lead tips projecting over the expected location of the right atrial appendage, right ventricular apex, and posteriorly within the azygos circulation. Epicardial pacing wires remain in place.  IMPRESSION: 1.  Persistent enlargement of the cardiopericardial silhouette again suggestive of underlying cardiomegaly and the presence of a pericardial effusion. 2.  Pulmonary venous congestion without frank pulmonary edema. 3.  Unusual placement of pacemaker/AICD leads redemonstrated, as above.  Original Report Authenticated By: Florencia Reasons, M.D.    Review of Systems  Constitutional: Positive for malaise/fatigue.  HENT: Negative.   Eyes: Negative.   Respiratory: Positive for shortness of breath.   Cardiovascular: Positive for orthopnea and leg swelling.  Gastrointestinal: Negative.   Genitourinary: Negative.   Musculoskeletal: Positive for back pain.  Skin: Positive for rash.  Neurological:  Positive for weakness.  Endo/Heme/Allergies: Bruises/bleeds easily.  Psychiatric/Behavioral: Negative.     Blood pressure 106/76, pulse 98, temperature 99 F (37.2 C), temperature source Oral, resp. rate 21, last menstrual period 01/26/2012, SpO2 94.00%. Physical Exam  Constitutional: She is oriented to person, place, and time. She appears well-developed and well-nourished. She appears distressed.  HENT:  Head: Normocephalic.  Mouth/Throat: No oropharyngeal exudate.  Eyes: Conjunctivae and EOM are normal. Pupils are equal, round, and reactive to light. No scleral icterus.  Neck: JVD present.  Cardiovascular: Regular rhythm, S2 normal and normal pulses.  PMI is displaced.  Exam reveals friction rub.   Murmur heard. Respiratory: She is in respiratory distress. She has decreased breath sounds. She has no rhonchi. She has no rales.       Midline sternotomy scar (healing)  GI: Soft. There is no tenderness.  Musculoskeletal: She exhibits edema.  Neurological: She is alert and oriented to person, place, and time. She has normal reflexes. A cranial nerve deficit is present.  Skin: Skin is warm and dry. Rash noted.  Psychiatric: She has a normal mood and affect.    EKG sinus tachycardia with LBBB  Assessment/Plan Pericardial effusion with early signs of cardiac tamponade  Admit to CCU Stop diuretics for now Stop BP reducing meds for now Strict bed rest Continue steroids and colchicine She needs an urgent pericardiocentesis in the morning.  Grandville Silos  03/14/2012, 2:22 AM

## 2012-03-14 NOTE — Preoperative (Signed)
Beta Blockers   Reason not to administer Beta Blockers:Hold beta blocker due to hypotension 

## 2012-03-14 NOTE — Anesthesia Postprocedure Evaluation (Signed)
  Anesthesia Post-op Note  Patient: [name]  Procedure(s) Performed: Procedure(s) (LRB): PERICARDIAL WINDOW (N/A)  Patient Location: PACU and ICU  Anesthesia Type: General  Level of Consciousness: Patient remains intubated per anesthesia plan  Airway and Oxygen Therapy: Patient remains intubated per anesthesia plan and Patient placed on Ventilator (see vital sign flow sheet for setting)  Post-op Pain: none  Post-op Assessment: Post-op Vital signs reviewed, Patient's Cardiovascular Status Stable, Respiratory Function Stable, Patent Airway, No signs of Nausea or vomiting and Pain level controlled  Post-op Vital Signs: stable  Complications: No apparent anesthesia complications

## 2012-03-14 NOTE — Transfer of Care (Signed)
Immediate Anesthesia Transfer of Care Note  Patient: [name]  Procedure(s) Performed: Procedure(s) (LRB): PERICARDIAL WINDOW (N/A)  Patient Location: SICU  Anesthesia Type: General  Level of Consciousness:Sedated. On vent  Airway & Oxygen Therapy: Patient remains intubated per anesthesia plan  Post-op Assessment: Report given to PACU RN and Post -op Vital signs reviewed and stable  Post vital signs: Reviewed and stable  Complications: No apparent anesthesia complications

## 2012-03-14 NOTE — Progress Notes (Signed)
Pt began have wide complex tachycardia around 0445.  Pt asleep.  RN to room, pt awakens with no problem and complains of no s/sx.  EKG performed and MD paged.  MD to bedside at 0500.  BP drops from systolic in mid 100's to mid 80's around 0510.  0515 250 mL NS bolus started and 100 mg Lidocaine given IV. Blood pressure bottomed out to mid 50's.  NS fluid let run to gravity.  BOP improved slightly to mid 70's 0533- additional 100 mg Lidocaine IV given.  Pt reports nausea and occasional palpitations.  0540 Pt VS recover.  Cardiac surgeon paged by [facility] fellow to bedside.  Pt HR relapses to 140s BP starts to trend down. Dr. Cornelius Moras to bedside, decision made to take to OR for subxyphoid pericardial window.  Consents signed, family at bedside.  Portable echo done by [facility] fellow for confirmation of pericardial effusion with Dr Barry Dienes watching.  Pt prepared for transport to OR.  Labs drawn, VS reported.  CRNA to bedside and report given to her on transport to OR.

## 2012-03-14 NOTE — ED Notes (Signed)
Report given to [facility]. Aware that flovent not given in ED due to inability to locate medication sent from pharmacy

## 2012-03-14 NOTE — Consult Note (Addendum)
CARDIOTHORACIC SURGERY CONSULTATION REPORT  PCP is Eustaquio Boyden, MD Referring Provider is Tonny Bollman, MD   Reason for consultation:  Pericardial tamponade  HPI:  Patient is a 41 yo female with hypertrophic cardiomyopathy who underwent septal myomectomy and mitral valve repair by Dr Gasper Lloyd at [facility] on 01/27/2012.  Her postoperative course has been complicated by CHF, pleural effusion requiring drainage, placement of ICD. DVT, skin necrosis left forearm attributed to coumadin, and pericardial effusion.  This is now her 3rd hospitalization over the last 3 weeks for worsening chest and back pain, class IV CHF and now pericardial tamponade complicated by hypotension and wide-complex tachycardia.  Bedside portable ECHO confirms large pericardial effusion with tamponade.  CT surgery consult has been requested.  Past Medical History  Diagnosis Date  . Hypertrophic cardiomyopathy     a) heart murmur age 42 but HOCM was eventually diagnosed during pregnancy. b) s/p septal myomectomy with implantable defibrillator 01/27/2012   . Anxiety state, unspecified   . Allergic rhinitis     to dogs  . Asthma   . History of anemia     during pregnancy  . History of chicken pox   . Generalized headaches   . Ocular migraine     no HA  . Back pain     told cracked bone, vicodin didn't help, improved with percocets/muscle relaxants, no eval by prior PCP  . 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 DVT (deep venous thrombosis)     Diagnosed 02/13/12  - was placed on Warfarin after cardiac surgery but had skin reaction to warfarin so was changed to Pradaxa so DVT occurred while on Pradaxa although unclear if it occurred during transition  . Hypotension   . Atrial fibrillation     a. On pradaxa.  coumadin necrosis with warfarin;  b. Amio d/c'd 02/2012  . ICD (implantable cardiac defibrillator) in place     02/04/2012, [employer]  .  Acute on chronic combined systolic and diastolic congestive heart failure   . Pericardial effusion   . Chronic combined systolic and diastolic CHF (congestive heart failure)     Past Surgical History  Procedure Date  . Cesarean section 1991  . Induced abortion 1990 and 1993  . US echocardiography 08/2011    severe LVH, severe asymmetric hypertrophy, EF 50-55%, grade 2 diastolic dysfunction  . Cardiac surgery 01/27/2012    median sternotomy, septal myectomy, MVR - [facility] (Dr. Silvestre Mesi)  . Cardiac defibrillator placement 01/2012  . Myomectomy     Family History  Problem Relation Age of Onset  . Diabetes Father   . Heart disease Father     unspecified heart problem  . Thyroid disease Mother   . Anemia Mother     mediterranean anemia, ITP  . Heart disease Maternal Grandfather     CHF  . Diabetes Paternal Grandfather   . Cancer Paternal Grandfather   . Coronary artery disease Neg Hx   . Stroke Neg Hx   . Sudden death Neg Hx     Social History History  Substance Use Topics  . Smoking status: Former Smoker -- 1.0 packs/day for 3 years    Types: Cigarettes    Quit date: 09/30/1988  . Smokeless tobacco: Never Used   Comment: quitin 1990  . Alcohol Use: 0.0 oz/week     rarely drinks wine    Prior to Admission  medications   Medication Sig Start Date End Date Taking? Authorizing Provider  aspirin 81 MG chewable tablet Chew 81 mg by mouth daily.   Yes Historical Provider, MD  beclomethasone (QVAR) 80 MCG/ACT inhaler Inhale 1 puff into the lungs 2 (two) times daily.   Yes Historical Provider, MD  cetirizine (ZYRTEC) 10 MG tablet Take 10 mg by mouth daily.     Yes Historical Provider, MD  colchicine 0.6 MG tablet Take 1 tablet (0.6 mg total) by mouth 2 (two) times daily. 03/05/12 03/05/13 Yes Ok Anis, NP  furosemide (LASIX) 40 MG tablet Take 40 mg by mouth daily.   Yes Historical Provider, MD  HYDROcodone-acetaminophen (VICODIN) 5-500 MG per tablet Take 1 tablet by mouth 3  (three) times daily as needed. 03/06/12  Yes Karie Schwalbe, MD  metoprolol succinate (TOPROL-XL) 50 MG 24 hr tablet Take 50 mg by mouth daily. Take with or immediately following a meal.   Yes Historical Provider, MD  ondansetron (ZOFRAN) 4 MG tablet Take 4 mg by mouth 3 (three) times daily as needed. For nausea and vomiting 03/06/12  Yes Karie Schwalbe, MD  predniSONE (DELTASONE) 10 MG tablet 5 Tabs daily x 1 more day, then 4 tabs daily x 2 days, then 3 tabs daily x 2 days, then 2 tabs daily x 2 days, then 1 tab daily. 03/11/12 03/21/12 Yes Ok Anis, NP    Current Facility-Administered Medications  Medication Dose Route Frequency Provider Last Rate Last Dose  . 0.9 %  sodium chloride infusion  250 mL Intravenous PRN Celene Kras, MD      . acetaminophen (TYLENOL) tablet 650 mg  650 mg Oral Q4H PRN Celene Kras, MD      . colchicine tablet 0.6 mg  0.6 mg Oral BID Celene Kras, MD      . colchicine tablet 0.6 mg  0.6 mg Oral To Major Celene Kras, MD   0.6 mg at 03/14/12 0238  . fluticasone (FLOVENT HFA) 44 MCG/ACT inhaler 1 puff  1 puff Inhalation BID Celene Kras, MD      . furosemide (LASIX) injection 80 mg  80 mg Intravenous Once Celene Kras, MD   80 mg at 03/13/12 2312  . lidocaine (cardiac) 100 mg/32ml (XYLOCAINE) 20 MG/ML injection 2%           . lidocaine (cardiac) 100 mg/45ml (XYLOCAINE) 20 MG/ML injection 2%           . loratadine (CLARITIN) tablet 10 mg  10 mg Oral Daily Celene Kras, MD      . nitroGLYCERIN (NITROGLYN) 2 % ointment 0.5 inch  0.5 inch Topical Once Celene Kras, MD   0.5 inch at 03/13/12 2317  . ondansetron (ZOFRAN) injection 4 mg  4 mg Intravenous Q6H PRN Celene Kras, MD   4 mg at 03/14/12 0353  . predniSONE (DELTASONE) tablet 40 mg  40 mg Oral Q breakfast Celene Kras, MD      . sodium chloride 0.9 % injection 3 mL  3 mL Intravenous Q12H Celene Kras, MD      . sodium chloride 0.9 % injection 3 mL  3 mL Intravenous PRN Celene Kras, MD        Allergies  Allergen  Reactions  . Warfarin And Related     Skin reaction    Review of Systems:  General:  decreased appetite, decreased energy   Respiratory:  no cough, no  wheezing, no hemoptysis, + pain with inspiration or cough, + shortness of breath  Cardiac:  + chest pain or tightness especially left upper back, + exertional SOB, + severe resting SOB, + PND, + orthopnea, + LE edema, + palpitations, no syncope, no defibrillator discharges  GI:   no difficulty swallowing, no hematochezia, no hematemesis, no melena, no constipation, no diarrhea   GU:   no dysuria, no urgency, no frequency   Musculoskeletal: no arthritis, no arthralgia   Vascular:  no pain suggestive of claudication   Neuro:   no symptoms suggestive of TIA's, no seizures, no headaches, no peripheral neuropathy   Endocrine:  Negative   HEENT:  no loose teeth or painful teeth,  no recent vision changes  Psych:   no anxiety, no depression    Physical Exam:   BP 98/65  Pulse 125  Temp 97.4 F (36.3 C) (Oral)  Resp 19  Ht 5' (1.524 m)  Wt 81.1 kg (178 lb 12.7 oz)  BMI 34.92 kg/m2  SpO2 99%  LMP 01/26/2012  General:  Obese female in acute distress  HEENT:  Unremarkable   Neck:   no JVD, no bruits, no adenopathy   Chest:   clear to auscultation, symmetrical breath sounds, no wheezes, no rhonchi , well-healing sternotomy  CV:   Faint heart sounds, RRR, + systolic murmur   Abdomen:  soft, non-tender, no masses   Extremities:  warm, well-perfused, pulses diminished  Rectal/GU  Deferred  Neuro:   Grossly non-focal and symmetrical throughout  Skin:   Clean and dry, no rashes, no breakdown  Diagnostic Tests:  Bedside portable ECHO reviewed and notable for large pericardial effusion with diastolic RV collapse   CHEST - 2 VIEW  Comparison: Chest x-ray 03/09/2012.  Findings: Lung volumes are within normal limits. There is a  retrocardiac opacity, favored to represent subsegmental  atelectasis. No definite focal airspace consolidation.  Mild  pulmonary venous congestion without frank pulmonary edema. The  cardiopericardial silhouette is moderate - severely enlarged  (similar to priors). Mediastinal contours are unremarkable. There  is a left-sided pacemaker/AICD in place with lead tips projecting  over the expected location of the right atrial appendage, right  ventricular apex, and posteriorly within the azygos circulation.  Epicardial pacing wires remain in place.  IMPRESSION:  1. Persistent enlargement of the cardiopericardial silhouette  again suggestive of underlying cardiomegaly and the presence of a  pericardial effusion.  2. Pulmonary venous congestion without frank pulmonary edema.  3. Unusual placement of pacemaker/AICD leads redemonstrated, as  above.  Original Report Authenticated By: Florencia Reasons, M.D.    Impression:  Pericardial tamponade in the setting of recent cardiac surgery.   Plan:  Emergency pericardial window.  Will plan intraoperative TEE.  Ms Dorrance and her family understand the indications, risks and potential benefits of surgery and desire to proceed as planned.    Salvatore Decent. Cornelius Moras, MD 03/14/2012 6:27 AM

## 2012-03-14 NOTE — Anesthesia Preprocedure Evaluation (Addendum)
Anesthesia Evaluation  Patient identified by MRN, date of birth, ID band Patient awake    Reviewed: Allergy & Precautions, H&P , NPO status , Patient's Chart, lab work & pertinent test results, reviewed documented beta blocker date and time   Airway Mallampati: II TM Distance: >3 FB Neck ROM: Full    Dental   Pulmonary shortness of breath, at rest and lying, asthma ,  + rhonchi   + decreased breath sounds      Cardiovascular +CHF + dysrhythmias Atrial Fibrillation + Cardiac Defibrillator Rhythm:Irregular Rate:Tachycardia  S/p MV repair Cardiac tamponade   Neuro/Psych  Headaches, PSYCHIATRIC DISORDERS Anxiety    GI/Hepatic   Endo/Other  Morbid obesity  Renal/GU      Musculoskeletal  (+) Fibromyalgia -  Abdominal (+) + obese,   Peds  Hematology   Anesthesia Other Findings   Reproductive/Obstetrics                        Anesthesia Physical Anesthesia Plan  ASA: IV and Emergent  Anesthesia Plan: General   Post-op Pain Management:    Induction: Intravenous  Airway Management Planned: Oral ETT  Additional Equipment: Arterial line, CVP and TEE  Intra-op Plan:   Post-operative Plan: Post-operative intubation/ventilation and Possible Post-op intubation/ventilation  Informed Consent: I have reviewed the patients History and Physical, chart, labs and discussed the procedure including the risks, benefits and alternatives for the proposed anesthesia with the patient or authorized representative who has indicated his/her understanding and acceptance.     Plan Discussed with: CRNA and Surgeon  Anesthesia Plan Comments:       Anesthesia Quick Evaluation

## 2012-03-14 NOTE — Anesthesia Procedure Notes (Signed)
Procedure Name: Intubation Date/Time: 03/14/2012 7:26 AM Performed by: Garen Lah Pre-anesthesia Checklist: Patient identified, Timeout performed, Emergency Drugs available, Suction available and Patient being monitored Patient Re-evaluated:Patient Re-evaluated prior to inductionOxygen Delivery Method: Circle system utilized Preoxygenation: Pre-oxygenation with 100% oxygen Intubation Type: IV induction Ventilation: Mask ventilation without difficulty Laryngoscope Size: Mac and 3 Grade View: Grade II Tube type: Oral Tube size: 8.0 mm Number of attempts: 1 Airway Equipment and Method: Stylet Placement Confirmation: ETT inserted through vocal cords under direct vision,  breath sounds checked- equal and bilateral and positive ETCO2 Secured at: 21 cm Tube secured with: Tape Dental Injury: Teeth and Oropharynx as per pre-operative assessment

## 2012-03-14 NOTE — Procedures (Signed)
Extubation Procedure Note  Patient Details:   Name: [name] DOB: June 25, 1971 MRN: [id number]   Airway Documentation:  Patient had NIF 25cm and VC>10cc/kg. Patient also had good cuff leak. Extubated pt and placed 4lpm [place] on her. sats 100%.  Evaluation  O2 sats: stable throughout Complications: No apparent complications Patient did tolerate procedure well. Bilateral Breath Sounds: Diminished Suctioning: Airway Yes  Clearance Coots 03/14/2012, 2:17 PM

## 2012-03-14 NOTE — Op Note (Signed)
CARDIOTHORACIC SURGERY OPERATIVE NOTE  Date of Procedure:   03/14/2012  Preoperative Diagnosis:  Pericardial Tamponade  Postoperative Diagnosis:  same  Procedure:    Subxyphoid Pericardial Window  Surgeon:    Salvatore Decent. Cornelius Moras, MD  Assistant:    Tanda Rockers, CRNFA  Anesthesia:    Bedelia Person, MD  Operative Findings:   Large hemorrhagic partially loculated pericardial effusion  Pericardial tamponade  Normal LV systolic function     BRIEF CLINICAL NOTE AND INDICATIONS FOR SURGERY   Patient is a 41 yo female with hypertrophic cardiomyopathy who underwent septal myomectomy and mitral valve repair by Dr Gasper Lloyd at [facility] on 01/27/2012.  Her postoperative course has been complicated by CHF, pleural effusion requiring drainage, placement of ICD. DVT, skin necrosis left forearm attributed to coumadin, and pericardial effusion.  This is now her 3rd hospitalization over the last 3 weeks for worsening chest and back pain, class IV CHF and now pericardial tamponade complicated by hypotension and wide-complex tachycardia.  Bedside portable ECHO confirms large pericardial effusion with tamponade.  CT surgery consult has been requested.  The patient has been seen in consultation and counseled at length regarding the indications, risks and potential benefits of surgery.  All questions have been answered, and the patient provides full informed consent for the operation as described.      DETAILS OF THE OPERATIVE PROCEDURE  The patient is brought to the operating room on the above mentioned date.  On arrival in to the intensive care unit the patient is tachycardic and hypotensive and unable to lay down flat on the bed. General endotracheal anesthesia is induced uneventfully without further hemodynamic compromise. Right subclavian central venous catheter is placed for central IV access by the anesthesia team. The patient's anterior chest abdomen and both groins are prepared and draped in a sterile  manner. Baseline transesophageal echocardiogram was performed confirming a large pericardial effusion with diastolic right ventricular collapse. There is no mitral regurgitation.  A timeout procedures performed. A vertical midline incision is begun involving the inferior portion of the patient's previous median sternotomy incision and extending towards the umbilicus. The incision is completed to the subcutaneous fat and the linea alba with electrocautery. The left rectus abdominis muscle is retracted laterally and the subjacent pre-peritoneal fat and fascia is retracted inferiorly. The anterior and inferior surface of the pericardium is identified. This is somewhat challenging due to the patient's body habitus. An incision is made in the pericardium. Anteriorly the epicardial surface of the heart is stuck to the pericardium but inferiorly a space with bloody fluid is entered. Upon entry into the pericardial sac a large volume of dark old blood was evacuated. The patient has immediate improvement in hemodynamics with resolution of supraventricular tachycardia, resumption of sinus rhythm, and resumption of normal blood pressure. The bloody fluid in the pericardial space is notably partially loculated. A blunt sucker is utilized to explore the pericardial sac and evacuate all of the pericardial fluid trapped in separate pockets. A total of approximately 1100 mL of bloody fluid is evacuated. Portions of the fluid are trapped and sent for cell count and differential, culture, and cytology.  Meticulous hemostasis is ascertained. The pericardial sac was drained with a 32 French Bard chest tube. The surgical incision is closed using interrupted #1 Vicryl sutures to close the fascia. The subcutaneous tissues are closed in layers and the skin is closed with skin staples.  The patient tolerated the procedure well and is transported directly to the surgical intensive  care unit intubated sedated and ventilated in stable  condition. There are no intraoperative complications. All sponge instrument and needle counts are verified correct.     Salvatore Decent. Cornelius Moras MD 03/14/2012 9:10 AM

## 2012-03-14 NOTE — Progress Notes (Signed)
[facility] BRIEF SICU PROGRESS NOTE  Day of Surgery  S/P Procedure(s) (LRB): PERICARDIAL WINDOW (N/A)   Extubated uneventfully Feels much better NSR BP stable UOP adequate Labs okay  Plan: Continue routine early postop  Branda Chaudhary H 03/14/2012 5:28 PM

## 2012-03-14 NOTE — Op Note (Signed)
NAMECURRY, DULSKI NO.:  [id number]  MEDICAL RECORD NO.:  [id number]  LOCATION:  2306                         FACILITY:  [facility]  PHYSICIAN:  Bedelia Person, M.D.        DATE OF BIRTH:  06-Jul-1971  DATE OF PROCEDURE:  03/14/2012 DATE OF DISCHARGE:                              OPERATIVE REPORT   Ms. [name] is an unfortunate 41 year old who underwent coronary artery bypass grafting and mitral valve repair at an outside hospital approximately 3 months ago.  At this time, she has a large pericardial effusion which needs drainage.  TEE will be used intraoperatively to assess the location, the extent of drainage and left ventricular function and valvular function pre and post drainage.  The patient has no history of esophageal or gastric pathology.  The patient was induced with general anesthesia and the airway was secured with an oral endotracheal tube and orogastric tube was placed to drain remaining gastric contents.  The transesophageal probe was heavily lubricated and placed blindly down the oropharynx with no resistance. It remained at the 40-45 cm mark throughout the procedure.  At the completion of the procedure, the probe was removed.  There was no evidence of oropharyngeal damage.  Initial examination showed pericardial effusion which appeared to be greatest in the lateral and posterior regions approximating 3 cm in thickness.  The left ventricle was dilated.  There was severe hypokinesis of the inferior, posterior, and septal walls.  Left atrium was enlarged.  The was no interatrial septal defect.  There was no appendage noted.  The mitral valve showed a prior ring placement.  There was moderate to severe restricted motion of both the anterior and the posterior leaflets.  Color Doppler did reveal trace mitral insufficiency on extending over the posterior leaflet.  The aortic valve has 3 leaflets.  There was no calcification noted.  Color Doppler did  reveal trace aortic insufficiency.  Right atrial and ventricular chambers were somewhat compressed.  Tricuspid valve was normal in appearance.  Color Doppler showed trace insufficiency.  After drainage of the pericardial effusion, there remained a minimal effusion in the posterior region. Post drainage, there was improved chamber size of both the right atrium and ventricle.  There was also improved contractility of the left ventricle especially in the anterior and lateral walls that remained severe hypokinesis of the inferior posterior and septal walls.  Mitral valve continued to show the trace mitral insufficiency over the posterior leaflet but no significant change.  There was no other changes postdrainage.          ______________________________ Bedelia Person, M.D.     LK/MEDQ  D:  03/14/2012  T:  03/14/2012  Job:  [id number]

## 2012-03-15 ENCOUNTER — Inpatient Hospital Stay (HOSPITAL_COMMUNITY): Payer: 59

## 2012-03-15 LAB — BASIC METABOLIC PANEL
Chloride: 99 mEq/L (ref 96–112)
GFR calc Af Amer: >90 mL/min (ref >90)
Potassium: 3.9 mEq/L (ref 3.5–5.1)
Sodium: 137 mEq/L (ref 135–145)

## 2012-03-15 LAB — CBC
HCT: 30.9 % — ABNORMAL LOW (ref 36.0–46.0)
Platelets: 294 10*3/uL (ref 150–400)
RDW: 16.1 % — ABNORMAL HIGH (ref 11.5–15.5)
WBC: 8.3 10*3/uL (ref 4.0–10.5)

## 2012-03-15 LAB — GLUCOSE, CAPILLARY: Glucose-Capillary: 80 mg/dL (ref 70–99)

## 2012-03-15 LAB — MAGNESIUM: Magnesium: 2.1 mg/dL (ref 1.5–2.5)

## 2012-03-15 MED ORDER — SODIUM CHLORIDE 0.45 % IV SOLN
INTRAVENOUS | Status: DC
  Administered 2012-03-15 (×2): via INTRAVENOUS
  Administered 2012-03-15: 20 mL/h via INTRAVENOUS

## 2012-03-15 MED ORDER — SODIUM CHLORIDE 0.9 % IJ SOLN: 3.0000 mL | INTRAMUSCULAR | Status: DC | PRN

## 2012-03-15 MED ORDER — TRAMADOL HCL 50 MG PO TABS
50.0000 mg | ORAL_TABLET | ORAL | Status: DC | PRN
  Administered 2012-03-15 – 2012-03-18 (×2): 100 mg via ORAL
  Filled 2012-03-15 (×2): qty 2

## 2012-03-15 MED ORDER — SODIUM CHLORIDE 0.9 % IV SOLN: 250.0000 mL | INTRAVENOUS | Status: DC | PRN

## 2012-03-15 MED ORDER — SODIUM CHLORIDE 0.9 % IJ SOLN
3.0000 mL | Freq: Two times a day (BID) | INTRAMUSCULAR | Status: DC
  Administered 2012-03-15 – 2012-03-19 (×9): 3 mL via INTRAVENOUS

## 2012-03-15 MED ORDER — POTASSIUM CHLORIDE 10 MEQ/50ML IV SOLN
INTRAVENOUS | Status: AC
  Filled 2012-03-15: qty 100

## 2012-03-15 NOTE — Progress Notes (Signed)
   CARDIOTHORACIC SURGERY PROGRESS NOTE   R1 Day Post-Op Procedure(s) (LRB): PERICARDIAL WINDOW (N/A)  Subjective: Feels sore along surgical incision but otherwise well  Objective: Vital signs: BP Readings from Last 1 Encounters:  03/14/12 101/60   Pulse Readings from Last 1 Encounters:  03/15/12 92   Resp Readings from Last 1 Encounters:  03/15/12 20   Temp Readings from Last 1 Encounters:  03/15/12 99.1 F (37.3 C) Oral    Hemodynamics:    Physical Exam:  Rhythm:   sinus  Breath sounds: clear  Heart sounds:  RRR  Incisions:  Dressings dry  Abdomen:  soft  Extremities:  Warm  Chest tube:  Low volume thin serosanguinous output   Intake/Output from previous day: 06/15 0701 - 06/16 0700 In: 2736.5 [P.O.:140; I.V.:2316.5; IV Piggyback:280] Out: 955 [Urine:630; Blood:25; Chest Tube:300] Intake/Output this shift:    Lab Results:  Basename 03/15/12 0400 03/14/12 0945  WBC 8.3 9.8  HGB 9.1* 9.0*  HCT 30.9* 29.1*  PLT 294 311   BMET:  Basename 03/15/12 0400 03/14/12 0942 03/14/12 0652  NA 137 139 --  K 3.9 3.2* --  CL 99 -- 97  CO2 28 -- 27  GLUCOSE 79 103* --  BUN 20 -- 20  CREATININE 0.76 -- 0.79  CALCIUM 8.6 -- 9.1    CBG (last 3)   Basename 03/15/12 0756 03/14/12 1953 03/14/12 1549  GLUCAP 80 77 88   ABG    Component Value Date/Time   PHART 7.398 03/14/2012 1259   HCO3 30.6* 03/14/2012 1259   TCO2 32 03/14/2012 1259   O2SAT 100.0 03/14/2012 1259   CXR: Mild LLL atelectasis, otherwise clear  Assessment/Plan: S/P Procedure(s) (LRB): PERICARDIAL WINDOW (N/A)  Doing well following subxyphoid window Hypertrophic cardiomyopathy, recently s/p septal myomectomy and mitral repair at [facility] Hemorrhagic pericardial effusion - not a candidate for pharmacologic anticoagulation at present Expected post op acute blood loss anemia, mild, stable   Mobilize  D/C aline and foley  Leave pericardial tube at least one more day  Transfer 2000 if okay  with Cardiology team  Medical care per Cardiology   [facility] H 03/15/2012 9:42 AM

## 2012-03-16 ENCOUNTER — Ambulatory Visit (HOSPITAL_COMMUNITY): Payer: 59 | Attending: Cardiology

## 2012-03-16 ENCOUNTER — Inpatient Hospital Stay (HOSPITAL_COMMUNITY): Payer: 59

## 2012-03-16 ENCOUNTER — Other Ambulatory Visit: Payer: 59

## 2012-03-16 ENCOUNTER — Encounter (HOSPITAL_COMMUNITY): Payer: Self-pay | Admitting: Thoracic Surgery (Cardiothoracic Vascular Surgery)

## 2012-03-16 DIAGNOSIS — I82409 Acute embolism and thrombosis of unspecified deep veins of unspecified lower extremity: Secondary | ICD-10-CM

## 2012-03-16 DIAGNOSIS — I422 Other hypertrophic cardiomyopathy: Secondary | ICD-10-CM

## 2012-03-16 DIAGNOSIS — I319 Disease of pericardium, unspecified: Secondary | ICD-10-CM

## 2012-03-16 DIAGNOSIS — I509 Heart failure, unspecified: Secondary | ICD-10-CM

## 2012-03-16 LAB — BASIC METABOLIC PANEL
GFR calc Af Amer: >90 mL/min (ref >90)
GFR calc non Af Amer: >90 mL/min (ref >90)
Potassium: 3.6 mEq/L (ref 3.5–5.1)
Sodium: 134 mEq/L — ABNORMAL LOW (ref 135–145)

## 2012-03-16 LAB — CBC
MCHC: 29.2 g/dL — ABNORMAL LOW (ref 30.0–36.0)
RDW: 16.5 % — ABNORMAL HIGH (ref 11.5–15.5)

## 2012-03-16 LAB — PATHOLOGIST SMEAR REVIEW

## 2012-03-16 MED ORDER — POTASSIUM CHLORIDE CRYS ER 20 MEQ PO TBCR
40.0000 meq | EXTENDED_RELEASE_TABLET | Freq: Once | ORAL | Status: AC
  Administered 2012-03-16: 40 meq via ORAL
  Filled 2012-03-16: qty 2

## 2012-03-16 MED ORDER — POTASSIUM CHLORIDE 10 MEQ/50ML IV SOLN
INTRAVENOUS | Status: AC
  Administered 2012-03-16: 10 meq via INTRAVENOUS
  Filled 2012-03-16: qty 50

## 2012-03-16 MED ORDER — POTASSIUM CHLORIDE 10 MEQ/50ML IV SOLN
10.0000 meq | INTRAVENOUS | Status: AC
  Administered 2012-03-16 (×3): 10 meq via INTRAVENOUS
  Filled 2012-03-16: qty 100

## 2012-03-16 MED ORDER — METOPROLOL TARTRATE 25 MG PO TABS
25.0000 mg | ORAL_TABLET | Freq: Two times a day (BID) | ORAL | Status: DC
  Administered 2012-03-18: 25 mg via ORAL
  Filled 2012-03-16 (×9): qty 1

## 2012-03-16 NOTE — Progress Notes (Signed)
Patient ID: [name], female   DOB: 07-Aug-1971, 41 y.o.   MRN: [id number]    Subjective:  Less dyspnic.  Pericardial tube hurts  Objective:  Filed Vitals:   03/16/12 1000 03/16/12 1100 03/16/12 1121 03/16/12 1224  BP: 100/59 97/61  86/59  Pulse: 92 78  78  Temp:   98.8 F (37.1 C) 97.3 F (36.3 C)  TempSrc:   Oral Oral  Resp: 22 14  18   Height:      Weight:      SpO2: 100% 98%  96%    Intake/Output from previous day:  Intake/Output Summary (Last 24 hours) at 03/16/12 1441 Last data filed at 03/16/12 1200  Gross per 24 hour  Intake   1020 ml  Output    950 ml  Net     70 ml    Physical Exam:  Affect appropriate Healthy:  appears stated age HEENT: normal Neck supple with no adenopathy JVP normal no bruits no thyromegaly Lungs clear with no wheezing and good diaphragmatic motion Heart:  S1/S2 no LVOT murmur positive rub PMI normal subxiphoid pericardial tube in place Abdomen: benighn, BS positve, no tenderness, no AAA no bruit.  No HSM or HJR Distal pulses intact with no bruits No edema Neuro non-focal Skin warm and dry No muscular weakness Pacer under left clavicle stenotomy healing   Lab Results: Basic Metabolic Panel:  Basename 03/16/12 0450 03/15/12 0400  NA 134* 137  K 3.6 3.9  CL 98 99  CO2 30 28  GLUCOSE 98 79  BUN 15 20  CREATININE 0.63 0.76  CALCIUM 8.8 8.6  MG -- 2.1  PHOS -- --   Liver Function Tests:  [facility] 03/13/12 1911  AST 35  ALT 26  ALKPHOS 87  BILITOT 0.5  PROT 8.0  ALBUMIN 3.7   No results found for this basename: LIPASE:2,AMYLASE:2 in the last 72 hours CBC:  Basename 03/16/12 0450 03/15/12 0400  WBC 6.9 8.3  NEUTROABS -- --  HGB 9.4* 9.1*  HCT 32.2* 30.9*  MCV 83.0 82.4  PLT 308 294    Imaging: Dg Chest Port 1 View  03/16/2012  *RADIOLOGY REPORT*  Clinical Data: Status post pericardial window placement.  PORTABLE CHEST - 1 VIEW  Comparison: Chest 03/14/2012 and 03/15/2012.  Findings: Pericardial drain  remains in place.  Enlarged cardiopericardial silhouette is unchanged.  Left basilar airspace disease persists.  No pneumothorax.  Right subclavian catheter and AICD again noted.  IMPRESSION: No change in marked enlargement of the cardiopericardial silhouette with pericardial drain in place.  Original Report Authenticated By: Bernadene Bell. D'ALESSIO, M.D.   Dg Chest Portable 1 View In Am  03/15/2012  *RADIOLOGY REPORT*  Clinical Data: Postoperative study after subxyphoid pericardial window placement.  PORTABLE CHEST - 1 VIEW  Comparison: Chest x-ray 03/14/2012.  Findings: Previously noted pericardial drain remains in position with tip projecting over the lower left hemithorax. There is a right-sided subclavian central venous catheter with tip terminating in the mid superior vena cava. Postoperative changes of median sternotomy.  Epicardial pacing wires remain in place.  Left-sided pacemaker/AICD with unchanged lead positions.  Lung volumes remain low.  There continues to be a retrocardiac opacity on the left, favored to represent atelectasis.  Small left-sided pleural effusion is difficult to exclude (however, the appearance is likely secondary to underpenetration of the film).  Mild pulmonary venous congestion without frank pulmonary edema.  Moderate enlargement of the cardiopericardial silhouette, similar to yesterday's examination.  IMPRESSION: 1.  Allowing for  slight differences in patient positioning, the radiographic appearance of the chest is essentially unchanged compared to yesterday's examination, as detailed above.  Original Report Authenticated By: Florencia Reasons, M.D.    Cardiac Studies:  ECG: 6/17  SR/ST rate 90 LBBB LAD paced   Telemetry: NSR no afib or VT  Echo: EF 30-35% S/P MV repair large effusion on 6/15  introp TEE Effusion gone   Medications:     . acetaminophen  1,000 mg Oral Q6H  . aspirin EC  325 mg Oral Daily  . bisacodyl  10 mg Oral Daily   Or  . bisacodyl  10 mg Rectal  Daily  . cefUROXime (ZINACEF)  IV  1.5 g Intravenous Q12H  . docusate sodium  200 mg Oral Daily  . metoprolol tartrate  25 mg Oral BID  . pantoprazole  40 mg Oral Q1200  . potassium chloride  10 mEq Intravenous Q1 Hr x 3  . potassium chloride      . potassium chloride  40 mEq Oral Once  . sodium chloride  3 mL Intravenous Q12H  . DISCONTD: metoprolol tartrate  12.5 mg Oral BID       . sodium chloride 20 mL/hr (03/15/12 2154)    Assessment/Plan:  Pericardial effusion:  S/P drainage.  Tube out tomorrow   F/U echo in a week  DVT:  Had extensive LUE thrombus from stasis and AICD/Pacer implant.  Should be back on pradaxa Will forward to [facility] but suspect a week off pradaxa to let heal from surgery would be appr AICD:  Pacing normally but patient indicates that ? defib functionality turned off for surgery.  Have left  Message for Amber to turn back on.  CXR shows azygous lead ? Placed due to high DF thresholds at  Time of implant.  Will need F/U with EPS as outpaitent CHF:  EF markedly decreased post op in the 30-35% range with diastolic dysfunction from HOCM.  She initially Had no LVOT gradient with myectomy and MV repair.  Will have to review meds but suspect she should be on  Diuretic and ACE.   Charlton Haws 03/16/2012, 2:41 PM

## 2012-03-16 NOTE — Progress Notes (Signed)
Called report to Norwood Levo, RN and met at bedside. Patient was transferred to telemetry monitor with no complications.

## 2012-03-16 NOTE — Progress Notes (Addendum)
[address]            [name] [address]          [phone]     2 Days Post-Op Procedure(s) (LRB): PERICARDIAL WINDOW (N/A)  Subjective: OOB in chair, sore, but otherwise feels ok.  Objective: Vital signs in last 24 hours: Patient Vitals for the past 24 hrs:  BP Temp Temp src Pulse Resp SpO2 Weight  03/16/12 0739 - 98.8 F (37.1 C) Oral - - - -  03/16/12 0700 103/66 mmHg - - 83  14  97 % -  03/16/12 0615 - - - 90  23  95 % -  03/16/12 0600 109/66 mmHg - - - 23  98 % 189 lb 6 oz (85.9 kg)  03/16/12 0500 100/65 mmHg - - 78  10  99 % -  03/16/12 0408 - 98.6 F (37 C) Oral - - - -  03/16/12 0400 105/58 mmHg - - 77  12  98 % -  03/16/12 0300 98/63 mmHg - - 75  10  96 % -  03/16/12 0200 99/66 mmHg - - 80  12  97 % -  03/16/12 0100 107/52 mmHg - - 77  11  96 % -  03/16/12 0003 - 98.1 F (36.7 C) Oral - - - -  03/16/12 0000 99/59 mmHg - - 78  15  98 % -  03/15/12 2300 113/63 mmHg - - 95  23  94 % -  03/15/12 2200 113/73 mmHg - - 89  19  96 % -  03/15/12 2100 99/60 mmHg - - 84  18  96 % -  03/15/12 2008 - 98 F (36.7 C) Oral - - - -  03/15/12 2000 113/63 mmHg - - 95  21  96 % -  03/15/12 1900 - - - 90  15  94 % -  03/15/12 1800 - - - 94  22  97 % -  03/15/12 1700 - - - 88  21  96 % -  03/15/12 1600 - - - 89  18  94 % -  03/15/12 1500 - - - 89  20  98 % -  03/15/12 1400 - - - 90  16  97 % -  03/15/12 1330 - - - 96  20  97 % -  03/15/12 1300 - - - 92  21  98 % -  03/15/12 1200 - - - 88  17  97 % -  03/15/12 1138 - 99.4 F (37.4 C) Oral - - - -  03/15/12 1100 - - - 99  24  96 % -  03/15/12 1000 - - - 93  21  98 % -  03/15/12 0900 - - - 89  18  97 % -  03/15/12 0830 - - - 92  25  100 % -  03/15/12 0800 - - - 92  19  98 % -   Current Weight  03/16/12 189 lb 6 oz (85.9 kg)     Intake/Output from previous day: 06/16 0701 - 06/17 0700 In: 1330 [P.O.:300; I.V.:880; IV Piggyback:150] Out: 1045 [Urine:755; Chest  Tube:290]    PHYSICAL EXAM:  Heart: RRR Lungs: slightly decreased BS in bases Wound: clean and dry   Lab Results: CBC: Basename 03/16/12 0450 03/15/12 0400  WBC 6.9 8.3  HGB 9.4* 9.1*  HCT 32.2*  30.9*  PLT 308 294   BMET:  Basename 03/16/12 0450 03/15/12 0400  NA 134* 137  K 3.6 3.9  CL 98 99  CO2 30 28  GLUCOSE 98 79  BUN 15 20  CREATININE 0.63 0.76  CALCIUM 8.8 8.6    PT/INR:  Basename 03/14/12 0945  LABPROT 17.7*  INR 1.43   CXR: IMPRESSION:  No change in marked enlargement of the cardiopericardial silhouette  with pericardial drain in place.    Assessment/Plan: S/P Procedure(s) (LRB): PERICARDIAL WINDOW (N/A) Pericardial drain with decreasing output (100 ml overnight, 290 over past 24 hrs).  Will leave for now, hopefully can d/c soon if drainage continues to slow down. Continue current care per cardiology/primary team.   LOS: 3 days    COLLINS,GINA H 03/16/2012   I have seen and examined the patient and agree with the assessment and plan as outlined.  Okay to transfer to 2000.  Leave pericardial tube one more day.  Increase beta blocker.  Aprile Dickenson H 03/16/2012 9:40 AM

## 2012-03-16 NOTE — Progress Notes (Signed)
   ELECTROPHYSIOLOGY ROUNDING NOTE    Patient Name: [name] Date of Encounter: 03-16-2012  Per Dr Eden Emms, ICD tachy therapies were turned off for surgery.  Patient has a Environmental manager ICD.  I have contacted rep, Joey Deakins to interrogate device today and verify tachy therapies are back on.  Appt made with Dr Ladona Ridgel June 23, 2012 at 11:45AM to establish.  Device function normal at check last hospitalization.   Signed, Marena Chancy, BSN

## 2012-03-16 NOTE — Plan of Care (Signed)
Problem: Phase III Progression Outcomes Goal: Time patient transferred to PCTU/Telemetry POD Outcome: Completed/Met Date Met:  03/16/12 1230

## 2012-03-17 ENCOUNTER — Inpatient Hospital Stay (HOSPITAL_COMMUNITY): Payer: 59

## 2012-03-17 LAB — BODY FLUID CULTURE
Culture: NO GROWTH
Gram Stain: NONE SEEN

## 2012-03-17 MED ORDER — FLUTICASONE PROPIONATE HFA 44 MCG/ACT IN AERO
2.0000 | INHALATION_SPRAY | Freq: Two times a day (BID) | RESPIRATORY_TRACT | Status: DC
  Administered 2012-03-17 – 2012-03-20 (×5): 2 via RESPIRATORY_TRACT
  Filled 2012-03-17: qty 10.6

## 2012-03-17 MED ORDER — ALPRAZOLAM 0.5 MG PO TABS
0.5000 mg | ORAL_TABLET | Freq: Three times a day (TID) | ORAL | Status: DC | PRN
  Administered 2012-03-17 – 2012-03-18 (×2): 0.5 mg via ORAL
  Filled 2012-03-17 (×2): qty 1

## 2012-03-17 MED ORDER — FUROSEMIDE 10 MG/ML IJ SOLN
20.0000 mg | Freq: Two times a day (BID) | INTRAMUSCULAR | Status: DC
  Administered 2012-03-17 – 2012-03-19 (×6): 20 mg via INTRAVENOUS
  Filled 2012-03-17 (×9): qty 2

## 2012-03-17 NOTE — Progress Notes (Signed)
Pt c/o anxiety; PA paged to make aware; pt takes occasional Xanax at home; will await callback.

## 2012-03-17 NOTE — Progress Notes (Signed)
PA returned page; will enter orders for Xanax.

## 2012-03-17 NOTE — Progress Notes (Signed)
CARDIAC REHAB PHASE I   PRE:  Rate/Rhythm: 94SRBBB  BP:  Supine:   Sitting: 98/66  Standing:    SaO2: 96%RA  MODE:  Ambulation: 500 ft   POST:  Rate/Rhythem: 98SRBBB  BP:  Supine:   Sitting: 92/50  Standing:    SaO2: 98%RA 1342-1405 Pt sleepy from medication. Awakened pt to walk. Ambulated 500 ft on RA with rolling walker and asst x 1. Tolerated well. Did not need to take a rest break. To bathroom after walk. To recliner with call bell.  Duanne Limerick

## 2012-03-17 NOTE — Progress Notes (Signed)
Pt given 0.5mg PO Xanax at this time per pt request; will cont. To monitor. 

## 2012-03-17 NOTE — Progress Notes (Signed)
Patient ID: [name], female   DOB: [birth year], 41 y.o.   MRN: [id number]    Subjective:  Less dyspnic.  Pericardial tube hurts Swelling in legs again  Objective:  Filed Vitals:   03/16/12 1224 03/16/12 1815 03/16/12 2043 03/17/12 0404  BP: 86/59 98/66 95/66  105/72  Pulse: 78 88 88 98  Temp: 97.3 F (36.3 C)  99 F (37.2 C) 98.4 F (36.9 C)  TempSrc: Oral  Oral Oral  Resp: 18  20 24   Height:      Weight:    86.002 kg (189 lb 9.6 oz)  SpO2: 96%  98% 96%    Intake/Output from previous day:  Intake/Output Summary (Last 24 hours) at 03/17/12 0817 Last data filed at 03/17/12 0413  Gross per 24 hour  Intake    123 ml  Output    301 ml  Net   -178 ml    Physical Exam:  Affect appropriate Healthy:  appears stated age HEENT: normal Neck supple with no adenopathy JVP normal no bruits no thyromegaly Lungs clear with no wheezing and good diaphragmatic motion Heart:  S1/S2 no LVOT murmur positive rub PMI normal subxiphoid pericardial tube in place Abdomen: benighn, BS positve, no tenderness, no AAA no bruit.  No HSM or HJR Distal pulses intact with no bruits Plus one bilateral edema Neuro non-focal Skin warm and dry No muscular weakness Pacer under left clavicle stenotomy healing   Lab Results: Basic Metabolic Panel:  Basename 03/16/12 0450 03/15/12 0400  NA 134* 137  K 3.6 3.9  CL 98 99  CO2 30 28  GLUCOSE 98 79  BUN 15 20  CREATININE 0.63 0.76  CALCIUM 8.8 8.6  MG -- 2.1  PHOS -- --   Liver Function Tests: No results found for this basename: AST:2,ALT:2,ALKPHOS:2,BILITOT:2,PROT:2,ALBUMIN:2 in the last 72 hours No results found for this basename: LIPASE:2,AMYLASE:2 in the last 72 hours CBC:  Basename 03/16/12 0450 03/15/12 0400  WBC 6.9 8.3  NEUTROABS -- --  HGB 9.4* 9.1*  HCT 32.2* 30.9*  MCV 83.0 82.4  PLT 308 294    Imaging: Dg Chest Port 1 View  03/17/2012  *RADIOLOGY REPORT*  Clinical Data:  Pericardial effusion post pericardial window   PORTABLE CHEST - 1 VIEW  Comparison: Portable exam 0649 hours compared to 03/16/2012  Findings: Left subclavian pacemaker / AICD leads stable. Pericardial drain and right subclavian line stable. Epicardial pacing wires present. Prior median sternotomy. Significant enlargement cardiac silhouette. Persistent left lower lobe atelectasis versus consolidation. No pneumothorax.  IMPRESSION: No interval change.  Original Report Authenticated By: Lollie Marrow, M.D.   Dg Chest Port 1 View  03/16/2012  *RADIOLOGY REPORT*  Clinical Data: Status post pericardial window placement.  PORTABLE CHEST - 1 VIEW  Comparison: Chest 03/14/2012 and 03/15/2012.  Findings: Pericardial drain remains in place.  Enlarged cardiopericardial silhouette is unchanged.  Left basilar airspace disease persists.  No pneumothorax.  Right subclavian catheter and AICD again noted.  IMPRESSION: No change in marked enlargement of the cardiopericardial silhouette with pericardial drain in place.  Original Report Authenticated By: Bernadene Bell. Maricela Curet, M.D.    Cardiac Studies:  ECG: 6/17  SR/ST rate 90 LBBB LAD paced   Telemetry: NSR no afib or VT paced with LBBB reviewed last 24 hours  Echo: EF 30-35% S/P MV repair large effusion on 6/15  introp TEE Effusion gone   Medications:      . acetaminophen  1,000 mg Oral Q6H  . aspirin EC  325  mg Oral Daily  . bisacodyl  10 mg Oral Daily   Or  . bisacodyl  10 mg Rectal Daily  . cefUROXime (ZINACEF)  IV  1.5 g Intravenous Q12H  . docusate sodium  200 mg Oral Daily  . furosemide  20 mg Intravenous BID  . metoprolol tartrate  25 mg Oral BID  . pantoprazole  40 mg Oral Q1200  . potassium chloride  10 mEq Intravenous Q1 Hr x 3  . potassium chloride  40 mEq Oral Once  . sodium chloride  3 mL Intravenous Q12H  . DISCONTD: metoprolol tartrate  12.5 mg Oral BID        . sodium chloride 20 mL/hr (03/15/12 2154)    Assessment/Plan:  Pericardial effusion:  S/P drainage.  Tube out ? Today   F/U echo in a week  DVT:  Had extensive LUE thrombus from stasis and AICD/Pacer implant.  Should be back on pradaxa Will forward to [facility] but suspect a week off pradaxa to let heal from surgery would be appr AICD:  Pacing normally magnet on during OR case.  Normal function with defib still turned on .  CXR shows azygous lead ? Placed due to high DF thresholds at  Time of implant.  Will need F/U with EPS as outpaitent CHF:  EF markedly decreased post op in the 30-35% range with diastolic dysfunction from HOCM.  She initially Had no LVOT gradient with myectomy and MV repair.  Diuretic resumed but BP soft for ACE                           Charlton Haws 8:20 AM 03/17/2012

## 2012-03-17 NOTE — Progress Notes (Addendum)
3 Days Post-Op Procedure(s) (LRB): PERICARDIAL WINDOW (N/A)  Subjective: [name] complains of pain this morning.  She states she is using the pain medication but it doesn't last very long.  She also complains of LE edema, which she states has gotten worse since stopping her Lasix  Objective: Vital signs in last 24 hours: Temp:  [97.3 F (36.3 C)-99 F (37.2 C)] 98.4 F (36.9 C) (06/18 0404) Pulse Rate:  [78-98] 98  (06/18 0404) Cardiac Rhythm:  [-] Normal sinus rhythm;Bundle branch block (06/17 2032) Resp:  [14-24] 24  (06/18 0404) BP: (86-108)/(59-72) 105/72 mmHg (06/18 0404) SpO2:  [96 %-100 %] 96 % (06/18 0404) Weight:  [189 lb 9.6 oz (86.002 kg)] 189 lb 9.6 oz (86.002 kg) (06/18 0404)  Intake/Output from previous day: 06/17 0701 - 06/18 0700 In: 173 [I.V.:23; IV Piggyback:150] Out: 301 [Urine:151; Chest Tube:150]  General appearance: alert, cooperative and no distress Heart: regular rate and rhythm Lungs: clear to auscultation bilaterally Abdomen: soft, non-tender; bowel sounds normal; no masses,  no organomegaly Extremities: edema 1-2+ Wound: clean and dry  Lab Results:  Basename 03/16/12 0450 03/15/12 0400  WBC 6.9 8.3  HGB 9.4* 9.1*  HCT 32.2* 30.9*  PLT 308 294   BMET:  Basename 03/16/12 0450 03/15/12 0400  NA 134* 137  K 3.6 3.9  CL 98 99  CO2 30 28  GLUCOSE 98 79  BUN 15 20  CREATININE 0.63 0.76  CALCIUM 8.8 8.6    PT/INR:  Basename 03/14/12 0945  LABPROT 17.7*  INR 1.43   ABG    Component Value Date/Time   PHART 7.398 03/14/2012 1259   HCO3 30.6* 03/14/2012 1259   TCO2 32 03/14/2012 1259   O2SAT 100.0 03/14/2012 1259   CBG (last 3)   Basename 03/15/12 2005 03/15/12 0756 03/14/12 1953  GLUCAP 127* 80 77    Assessment/Plan: S/P Procedure(s) (LRB): PERICARDIAL WINDOW (N/A)  2. Pericardial Drain- 150cc in last 24 hours, output continues to decrease, may be able to d/c today 3. LE edema- her weight has increased 5kg since admission, may  benefit from Lasix 4. Pain control- patient currently taking Oxy IR, encouraged her to use Ultram in addition to Oxy IR for some additional pain coverage 5. Dispo- will discuss removal of drain with Dr. Cornelius Moras, medical management per Cardiology   LOS: 4 days    [name], [name] 03/17/2012   I have seen and examined the patient and agree with the assessment and plan as outlined.  Emanual Lamountain H 03/17/2012 4:22 PM

## 2012-03-17 NOTE — Progress Notes (Signed)
Pt appears much more relaxed sitting in chair; pt states Xanax has help tremendously; family in room; will cont. To monitor.

## 2012-03-18 ENCOUNTER — Other Ambulatory Visit: Payer: Self-pay | Admitting: *Deleted

## 2012-03-18 ENCOUNTER — Other Ambulatory Visit (HOSPITAL_COMMUNITY): Payer: Self-pay | Admitting: Radiology

## 2012-03-18 DIAGNOSIS — I350 Nonrheumatic aortic (valve) stenosis: Secondary | ICD-10-CM

## 2012-03-18 DIAGNOSIS — I313 Pericardial effusion (noninflammatory): Secondary | ICD-10-CM

## 2012-03-18 MED FILL — Rocuronium Bromide IV Soln 100 MG/10ML (10 MG/ML): INTRAVENOUS | Qty: 1 | Status: AC

## 2012-03-18 NOTE — Progress Notes (Signed)
Chest tube removed without difficulty.  Pt tolerated procedure with no complications.  Occlusive dressing applied.  Will monitor

## 2012-03-18 NOTE — Progress Notes (Signed)
CARDIAC REHAB PHASE I   PRE:  Rate/Rhythm: 93 SR with BBB    BP: sitting 110/64    SaO2: 96 RA  MODE:  Ambulation: 890 ft   POST:  Rate/Rhythm: 106    BP: sitting 110/76     SaO2: 97 RA  Tolerated well. No c/o. Ready to get CT out. Will eval tomorrow if she needs RW for home. Requests her name be sent to [facility]. [phone]  Harriet Masson CES, ACSM

## 2012-03-18 NOTE — Progress Notes (Signed)
Patient ID: [name], female   DOB: Jan 02, 1971, 41 y.o.   MRN: [id number] Stable this am. See note and plan per Dr Eden Emms yesterday. I will arrange Echo and followup in the ofice with Dr Excell Seltzer. Will start Pradaxa then.

## 2012-03-18 NOTE — Progress Notes (Addendum)
4 Days Post-Op Procedure(s) (LRB): PERICARDIAL WINDOW (N/A)  Subjective: Ms. Niemeier complains of worsening LE swelling, especially in her feet.  Her pain is better controlled  Objective: Vital signs in last 24 hours: Temp:  [98.1 F (36.7 C)-98.7 F (37.1 C)] 98.5 F (36.9 C) (06/19 0401) Pulse Rate:  [89-95] 91  (06/19 0401) Cardiac Rhythm:  [-] Sinus tachycardia (06/18 1930) Resp:  [18-20] 18  (06/19 0401) BP: (95-114)/(63-76) 114/76 mmHg (06/19 0401) SpO2:  [95 %-99 %] 96 % (06/19 0733) Weight:  [186 lb 4.6 oz (84.5 kg)] 186 lb 4.6 oz (84.5 kg) (06/19 0401)  Intake/Output from previous day: 06/18 0701 - 06/19 0700 In: 960 [P.O.:960] Out: 2320 [Urine:2300; Chest Tube:20]  General appearance: alert, cooperative and no distress Heart: regular rate and rhythm Lungs: clear to auscultation bilaterally Abdomen: soft, non-tender; bowel sounds normal; no masses,  no organomegaly Extremities: edema 2-3+  Wound: clean and dry, Pericardial drian in place  Lab Results:  [facility] 03/16/12 0450  WBC 6.9  HGB 9.4*  HCT 32.2*  PLT 308   BMET:  Basename 03/16/12 0450  NA 134*  K 3.6  CL 98  CO2 30  GLUCOSE 98  BUN 15  CREATININE 0.63  CALCIUM 8.8    PT/INR: No results found for this basename: LABPROT,INR in the last 72 hours ABG    Component Value Date/Time   PHART 7.398 03/14/2012 1259   HCO3 30.6* 03/14/2012 1259   TCO2 32 03/14/2012 1259   O2SAT 100.0 03/14/2012 1259   CBG (last 3)   Basename 03/15/12 2005 03/15/12 0756  GLUCAP 127* 80    Assessment/Plan: S/P Procedure(s) (LRB): PERICARDIAL WINDOW (N/A)  2. Pericardial drain with 60cc in last 24 hours, will check with Dr. Cornelius Moras, but will most likely d/c today  3. LE edema- patient started on Lasix yesterday 4. Pain control- improved, now using both ultram and Oxy IR 5. Dispo- likely d/c of pericardial tube, will ok with Dr. Cornelius Moras   LOS: 5 days    Lowella Dandy 03/18/2012   I have seen and examined the  patient and agree with the assessment and plan as outlined.  Will d/c chest tube today.  Discharge planning and medical management of CHF and hypertrophic cardiomyopathy per Cardiology team.  I agree with plans to resume Pradaxa in a few weeks, but not presently.  Zuleika Gallus H 03/18/2012 9:44 AM

## 2012-03-19 ENCOUNTER — Inpatient Hospital Stay (HOSPITAL_COMMUNITY): Payer: 59

## 2012-03-19 ENCOUNTER — Other Ambulatory Visit (HOSPITAL_COMMUNITY): Payer: 59

## 2012-03-19 DIAGNOSIS — I509 Heart failure, unspecified: Secondary | ICD-10-CM

## 2012-03-19 DIAGNOSIS — I319 Disease of pericardium, unspecified: Secondary | ICD-10-CM

## 2012-03-19 LAB — URINALYSIS, ROUTINE W REFLEX MICROSCOPIC
Nitrite: NEGATIVE
Specific Gravity, Urine: 1.031 — ABNORMAL HIGH (ref 1.005–1.030)
Urobilinogen, UA: 1 mg/dL (ref 0.0–1.0)

## 2012-03-19 LAB — URINE MICROSCOPIC-ADD ON

## 2012-03-19 MED ORDER — SPIRONOLACTONE 25 MG PO TABS
25.0000 mg | ORAL_TABLET | Freq: Every day | ORAL | Status: DC
  Administered 2012-03-19 – 2012-03-20 (×2): 25 mg via ORAL
  Filled 2012-03-19 (×2): qty 1

## 2012-03-19 MED ORDER — SODIUM CHLORIDE 0.9 % IJ SOLN
10.0000 mL | INTRAMUSCULAR | Status: DC | PRN
  Administered 2012-03-19: 20 mL

## 2012-03-19 NOTE — Progress Notes (Addendum)
5 Days Post-Op Procedure(s) (LRB): PERICARDIAL WINDOW (N/A)  Subjective: Ms. Gilkerson continues to complain of swelling  Objective: Vital signs in last 24 hours: Temp:  [97.7 F (36.5 C)-98 F (36.7 C)] 98 F (36.7 C) (06/20 0409) Pulse Rate:  [76-92] 92  (06/20 0409) Cardiac Rhythm:  [-] Normal sinus rhythm (06/19 1940) Resp:  [18-20] 20  (06/20 0409) BP: (90-91)/(61-64) 91/62 mmHg (06/20 0409) SpO2:  [91 %-98 %] 91 % (06/20 0409) Weight:  [184 lb 3.2 oz (83.553 kg)] 184 lb 3.2 oz (83.553 kg) (06/20 0409)  Intake/Output from previous day: 06/19 0701 - 06/20 0700 In: 1200 [P.O.:1200] Out: 500 [Urine:500]  General appearance: alert, cooperative and no distress Heart: regular rate and rhythm Lungs: clear to auscultation bilaterally Abdomen: soft, non-tender; bowel sounds normal; no masses,  no organomegaly Extremities: edema 2+ Wound: clean and dry, staples remain in place  Lab Results: No results found for this basename: WBC:2,HGB:2,HCT:2,PLT:2 in the last 72 hours BMET: No results found for this basename: NA:2,K:2,CL:2,CO2:2,GLUCOSE:2,BUN:2,CREATININE:2,CALCIUM:2 in the last 72 hours  PT/INR: No results found for this basename: LABPROT,INR in the last 72 hours ABG    Component Value Date/Time   PHART 7.398 03/14/2012 1259   HCO3 30.6* 03/14/2012 1259   TCO2 32 03/14/2012 1259   O2SAT 100.0 03/14/2012 1259   CBG (last 3)  No results found for this basename: GLUCAP:3 in the last 72 hours  Assessment/Plan: S/P Procedure(s) (LRB): PERICARDIAL WINDOW (N/A)  1. Pericardial Drain removed yesterday- CXR no evidence of pleural effusion 2. Surgical incision with staples in place, have been in since 03/14/12- will need to remain for a total of 12-14 days 3. Dispo- patient doing well post pericardial drain removal.  Her incision sutures can start being removed late next week.  If patient is discharged prior to that will set up appointment at our office with nurse for removal.   Further management per Cardiology service.  Patient will also follow up with Dr. Cornelius Moras 2-3 wks from hospital discharge.  We will make appointment   LOS: 6 days    BARRETT, ERIN 03/19/2012   I have seen and examined the patient and agree with the assessment and plan as outlined.  Mantaj Chamberlin H 03/19/2012 12:52 PM

## 2012-03-19 NOTE — Progress Notes (Signed)
CARDIAC REHAB PHASE I   PRE:  Rate/Rhythm: 97 SR BBB  BP:  Supine:   Sitting: 90/56  Standing:    SaO2: 98 RA  MODE:  Ambulation: 1070 ft   POST:  Rate/Rhythem: 102 ST  BP:  Supine:   Sitting: 98/60  Standing:    SaO2: 96 RA 1050-1120 Tolerated ambulation well without c/o. VS stable Pt back to recliner after walk with call light in reach and family present.  [name]

## 2012-03-19 NOTE — Progress Notes (Signed)
Pt ambulating freely in room and hallway without walker or any assistance; anticipate d/c home tomorrow; will cont. To monitor.

## 2012-03-19 NOTE — Progress Notes (Signed)
  Patient ID: [name], female   DOB: [birth year], 41 y.o.   MRN: [id number]    Subjective:  Less dyspnic.  Gladd tube is out.  Still with some swelling in legs  Objective:  Filed Vitals:   03/18/12 0733 03/18/12 1330 03/18/12 1955 03/19/12 0409  BP:  90/61 91/64 91/62   Pulse:  76 87 92  Temp:  97.9 F (36.6 C) 97.7 F (36.5 C) 98 F (36.7 C)  TempSrc:  Oral Axillary Oral  Resp:  18 20 20   Height:      Weight:    83.553 kg (184 lb 3.2 oz)  SpO2: 96% 96% 98% 91%    Intake/Output from previous day:  Intake/Output Summary (Last 24 hours) at 03/19/12 0740 Last data filed at 03/18/12 1957  Gross per 24 hour  Intake   1200 ml  Output    500 ml  Net    700 ml    Physical Exam:  Affect appropriate Healthy:  appears stated age HEENT: normal Neck supple with no adenopathy JVP normal no bruits no thyromegaly Lungs clear with no wheezing and good diaphragmatic motion Heart:  S1/S2 SEM r positive rub PMI normal subxiphoid pericardial tube in place Abdomen: benighn, BS positve, no tenderness, no AAA no bruit.  No HSM or HJR Distal pulses intact with no bruits Plus one bilateral edema Neuro non-focal Skin warm and dry No muscular weakness Pacer under left clavicle stenotomy healing    Imaging: No results found.  Cardiac Studies:  ECG: 6/17  SR/ST rate 90 LBBB LAD paced   Telemetry: NSR no afib or VT paced with LBBB reviewed last 24 hours 03/19/2012   Echo: EF 30-35% S/P MV repair large effusion on 6/15  introp TEE Effusion gone   Medications:      . acetaminophen  1,000 mg Oral Q6H  . aspirin EC  325 mg Oral Daily  . bisacodyl  10 mg Oral Daily   Or  . bisacodyl  10 mg Rectal Daily  . docusate sodium  200 mg Oral Daily  . fluticasone  2 puff Inhalation BID  . furosemide  20 mg Intravenous BID  . metoprolol tartrate  25 mg Oral BID  . pantoprazole  40 mg Oral Q1200  . sodium chloride  3 mL Intravenous Q12H        . sodium chloride 20 mL/hr (03/15/12  2154)    Assessment/Plan:  Pericardial effusion:  S/P drainage.  Tube out  Echo today DVT:  Had extensive LUE thrombus from stasis and AICD/Pacer implant.  Should be back on pradaxa Will forward to [facility] but suspect a week off pradaxa to let heal from surgery would be appr AICD:  Pacing normally magnet on during OR case.  Normal function with defib still turned on .  CXR shows azygous lead ? Placed due to high DF thresholds at  Time of implant.  Will need F/U with EPS as outpaitent Arranged by [facility] CHF:  EF markedly decreased post op in the 30-35% range with diastolic dysfunction from HOCM.  She initially Had no LVOT gradient with myectomy and MV repair.  Diuretic resumed but BP soft for ACE   I/O positive Add aldactone today                          Charlton Haws 7:40 AM 03/19/2012

## 2012-03-19 NOTE — Progress Notes (Signed)
  Echocardiogram 2D Echocardiogram has been performed.  [name] FRANCES 03/19/2012, 10:22 AM

## 2012-03-19 NOTE — Progress Notes (Signed)
Pt c/o urinary burning and not feeling like she is completely emptying her bladder; pt questions whether or not she has UTI; PA notified; UA ordered; will await results.

## 2012-03-20 ENCOUNTER — Encounter: Payer: 59 | Admitting: Physician Assistant

## 2012-03-20 ENCOUNTER — Telehealth: Payer: Self-pay | Admitting: Cardiovascular Disease

## 2012-03-20 ENCOUNTER — Encounter (HOSPITAL_COMMUNITY): Payer: Self-pay | Admitting: Cardiology

## 2012-03-20 LAB — BASIC METABOLIC PANEL
CO2: 33 mEq/L — ABNORMAL HIGH (ref 19–32)
Calcium: 9.3 mg/dL (ref 8.4–10.5)
Chloride: 99 mEq/L (ref 96–112)
Glucose, Bld: 129 mg/dL — ABNORMAL HIGH (ref 70–99)
Potassium: 3.5 mEq/L (ref 3.5–5.1)
Sodium: 139 mEq/L (ref 135–145)

## 2012-03-20 MED ORDER — ASPIRIN 325 MG PO TBEC: 325.0000 mg | DELAYED_RELEASE_TABLET | Freq: Every day | ORAL | Status: AC

## 2012-03-20 MED ORDER — SPIRONOLACTONE 25 MG PO TABS: 25.0000 mg | ORAL_TABLET | Freq: Every day | ORAL | Status: DC

## 2012-03-20 MED ORDER — FUROSEMIDE 20 MG PO TABS: 20.0000 mg | ORAL_TABLET | Freq: Two times a day (BID) | ORAL | Status: DC

## 2012-03-20 MED ORDER — CIPROFLOXACIN HCL 250 MG PO TABS: 250.0000 mg | ORAL_TABLET | Freq: Two times a day (BID) | ORAL | Status: DC

## 2012-03-20 MED ORDER — HYDROCODONE-ACETAMINOPHEN 5-500 MG PO TABS: 1.0000 | ORAL_TABLET | Freq: Three times a day (TID) | ORAL | Status: DC | PRN

## 2012-03-20 NOTE — Progress Notes (Signed)
Patient ID: [name], female   DOB: May 04, 1971, 41 y.o.   MRN: [id number]    Subjective:  Concerned about CHF long term.  Discussed issues with diastolic and systolic issues. Not getting Beta blocker due to low BP.  Urine is cloudy Objective:  Filed Vitals:   03/19/12 1337 03/19/12 2009 03/19/12 2105 03/20/12 0402  BP: 94/59 90/61  94/59  Pulse: 59 94 92 106  Temp: 97.8 F (36.6 C) 99.1 F (37.3 C)  98.7 F (37.1 C)  TempSrc: Oral Oral  Oral  Resp: 20 18 16 17   Height:      Weight:    82.6 kg (182 lb 1.6 oz)  SpO2: 96% 95%  94%    Intake/Output from previous day:  Intake/Output Summary (Last 24 hours) at 03/20/12 0757 Last data filed at 03/20/12 0600  Gross per 24 hour  Intake   1560 ml  Output      0 ml  Net   1560 ml    Physical Exam:  Affect appropriate Overweight female HEENT: normal Neck supple with no adenopathy JVP normal no bruits no thyromegaly Lungs clear with no wheezing and good diaphragmatic motion Heart:  S1/S2 SEM r positive rub PMI normal subxiphoid pericardial tube in place Abdomen: benighn, BS positve, no tenderness, no AAA no bruit.  No HSM or HJR Distal pulses intact with no bruits Plus one bilateral edema Neuro non-focal Skin warm and dry No muscular weakness Pacer under left clavicle stenotomy healing    Imaging: Dg Chest 2 View  03/19/2012  *RADIOLOGY REPORT*  Clinical Data: Follow-up CHF.  CHEST - 2 VIEW  Comparison: 03/17/2012  Findings: Stable appearance of the left chest ICD.  There is a right subclavian central line tip in the SVC.  Heart remains enlarged.  There is improved aeration at the left lung base.  No significant pleural effusions.  Epicardial pacer wires are present. No evidence for pulmonary edema.  Few densities at the left lung base are suggestive for atelectasis.  The pericardial drain has been removed.  IMPRESSION: Removal of pericardial drain.  Improved aeration at the left lung base.  No evidence for edema.   Original Report Authenticated By: Richarda Overlie, M.D.    Cardiac Studies:  ECG: 6/17  SR/ST rate 90 LBBB LAD paced   Telemetry: NSR no afib or VT paced with LBBB reviewed last 24 hours 03/20/2012   Echo: EF 30-35% S/P MV repair large effusion on 6/15  introp TEE Effusion gone   Medications:      . acetaminophen  1,000 mg Oral Q6H  . aspirin EC  325 mg Oral Daily  . bisacodyl  10 mg Oral Daily   Or  . bisacodyl  10 mg Rectal Daily  . docusate sodium  200 mg Oral Daily  . fluticasone  2 puff Inhalation BID  . furosemide  20 mg Intravenous BID  . metoprolol tartrate  25 mg Oral BID  . pantoprazole  40 mg Oral Q1200  . sodium chloride  3 mL Intravenous Q12H  . spironolactone  25 mg Oral Daily        . sodium chloride 20 mL/hr (03/15/12 2154)    Assessment/Plan:  Pericardial effusion:  S/P drainage.  Tube out  Echo from yesterday reviewed.  Trivial effusion with LVOT gradient and EF 35% DVT:  Had extensive LUE thrombus from stasis and AICD/Pacer implant.  Should be back on pradaxa Will forward to [facility] but suspect a week off pradaxa to let heal  from surgery would be appr AICD:  Pacing normally magnet on during OR case.  Normal function with defib still turned on .  CXR shows azygous lead ? Placed due to high DF thresholds at  Time of implant.  Will need F/U with EPS as outpaitent Arranged by [facility] CHF:  EF markedly decreased post op in the 30-35% range with diastolic dysfunction from HOCM.  She initially Had no LVOT gradient with myectomy and MV repair.  Diuretic resumed but BP soft for ACE  Will D/C on aldactone And lasix.  No beta bocker or ACE for now.  BMET in 3 weeks UTI:  UA cloudy nitrite negative D/C with 3 days of cipro.    Patient ready for D/C F/U Dr Prescott Gum one week to restart Pradaxa and monitor weight Charlton Haws 7:57 AM 03/20/2012

## 2012-03-20 NOTE — Discharge Summary (Signed)
Discharge Summary   Patient ID: [name] MRN: [id number], DOB/AGE: 41/03/1971 41 y.o.  Primary MD: Eustaquio Boyden, MD Primary Cardiologist: Dr. Excell Seltzer Admit date: 03/13/2012 D/C date:     03/20/2012      Primary Discharge Diagnoses:  1. Cardiac tamponade 2/2 hemorrhagic pericardial effusion  - s/p subxyphoid pericardial window 03/14/12  - Repeat echo 03/19/12 showed trivial effusion   2. Acute on chronic combined systolic and diastolic congestive heart failure   - On lasix and aldactone; No ACEI or BB 2/2 hypotension, will need to be addressed at next clinic visit  - BMET at next clinic visit  - Discharge weight 182lbs (admision wt 178lbs)  3. Hypotension  4. Dysuria  - Cipro started 03/20/12, to complete 3 day course  5. H/o DVT 02/13/12  - No warfarin due to skin necrosis  - Pradaxa held due to #1, will need to be addressed at next clinic visit    Secondary Discharge Diagnoses:  1. Hypertrophic cardiomyopathy s/p septal myomectomy with implantable defibrillator 01/27/2012   2. 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 Atrial fibrillation a. On pradaxa. coumadin necrosis with warfarin; b. Amio d/c'd 02/2012  4. ICD (implantable cardiac defibrillator) in place 02/04/2012, [employer] 5. LBBB  6. Anxiety  7. Asthma   8. History of anemia during pregnancy  9. History of chicken pox   10. Generalized headaches   11. Ocular migraine no HA  12. Back pain told cracked bone, vicodin didn't help, improved with percocets/muscle relaxants, no eval by prior PCP  13. Cesarean section   Allergies Allergies  Allergen Reactions  . Warfarin And Related     Skin reaction    Diagnostic Studies/Procedures:   03/14/12 - Subxyphoid Pericardial Window  03/19/12 - 2D Echocardiogram Study Conclusions: - Left ventricle: The patient appears to have had an upper septal myectomy. Severe asymmetric septal hypertrophy. The cavity size was normal. Septal-lateral  dyssynchrony withsevere septal hypokinesis. Peak LV outflow tract gradient 35 mmHg. The estimated ejection fraction was 35%. Indeterminant diastolic function. - Aortic valve: There was no stenosis.  - Mitral valve: No mitral valve SAM. Hockey-stick appearance to the mitral valve. Moderately calcified annulus. Trivial regurgitation. No mean gradient was measured across the mitral valve. Similar to last echo that showed mild to moderate mitral stenosis. - Left atrium: The atrium was moderately dilated. - Right ventricle: The cavity size was normal. Pacer wire or catheter noted in right ventricle. Systolic function was normal. - Pulmonary arteries: No complete TR doppler jet so unable to estimate PA systolic pressure. - Systemic veins: IVC measured 1.6 cm, smaller. - Pericardium, extracardiac: A trivial pericardial effusion was identified. Impressions: Compared to last echo, RV is seen better and is probably normal in appearance. The pericardial effusion is now trivial. The IVC is now smaller. Study is consistent with hypertrophic cardiomyopathy s/p upper septal myectomy.  There is still severe septal hypertrophy. LVOT gradient is 35 mmHg peak. EF 35% with septal-lateral dyssynchrony and anteroseptal severe hypokinesis. There is probably mild to moderate mitral stenosis but gradient not measured.   History of Present Illness: 41 y.o. female w/ the above medical problems who presented to [facility] on 03/13/12 with complaints of shortness of breath.  She has an extensive medical history including HOCM s/p recent myomectomy at [facility] which was complicated by AFib, VT requiring ICD placement, drop in EF from normal to 30%, upper extremity DVT, skin necrosis on warfarin and more recently post-pericardectomy  syndrome with pericardial effuson which is being treated conservatively with steroids and colchicine. On day of presentation she was noted to be short of breath at her PCP office for which she was sent  to the ED  Hospital Course: In the ED she was noted to be short of breath with some obvious distress, but overall stable. Bedside echo revealed a large pericardial effusion with the largest pocket greater than 2cm and early signs of diastolic compression of the RVOT. She was admitted to the ICU for close monitoring with plans for pericardiocentesis. She developed hypotension and wide complex tachycardia requiring Lidocaine and IVF. She was evaluated by Dr. Cornelius Moras who felt she needed emergent pericardial window to prevent hemodynamic collapse. She was taken to the OR where she had immediate improvement in hemodynamics and resolution of SVT after pericardial window performed. She tolerated the procedure well without complication.  She developed mild lower extremity edema with positive I/Os and weight gain for which IV lasix and aldactone were started. Her blood pressure did not tolerate addition of an ACEI or continuation of BB at this time. Will need to be reevaluated as an outpatient. She was initially on pradaxa due to DVT, however, this was stopped during a previous hospitalization due to pericardial effusion. It was not resumed during this admission due to cardiac tamponade 2/2 hemorrhage pericardial effusion and will be addressed at her next clinic visit. Pathology results from pericardial biopsy were negative for malignancy. Chest tube was removed without complication. [facility] arranged for outpatient follow up on 03/26/12 at which time incision sutures/staples will be removed. Echo on 03/19/12 revealed EF 35% with trivial effusion. She complained of dysuria and cloudy urine. UA was nitrite negative, however, she was placed on Cipro with plans to complete 3 day course. She was able to ambulate with cardiac rehab without complaints of chest pain or sob.   She was seen and evaluated by Dr. Eden Emms who felt she was stable for discharge home with plans for follow up as scheduled below.  Discharge Vitals: Blood  pressure 104/71, pulse 102, temperature 98.8 F (37.1 C), temperature source Oral, resp. rate 18, height 5' (1.524 m), weight 182 lb 1.6 oz (82.6 kg), last menstrual period 02/25/2012, SpO2 94.00%.  Labs: Component Value Date   WBC 6.9 03/16/2012   HGB 9.4* 03/16/2012   HCT 32.2* 03/16/2012   MCV 83.0 03/16/2012   PLT 308 03/16/2012    Lab 03/16/12 0450  NA 134*  K 3.6  CL 98  CO2 30  BUN 15  CREATININE 0.63  CALCIUM 8.8  GLUCOSE 98     03/13/2012 19:11  Alkaline Phosphatase 87  Albumin 3.7  AST 35  ALT 26  Total Protein 8.0  Total Bilirubin 0.5     03/13/2012 19:11  Pro B Natriuretic peptide (BNP) 3938.0 (H)     03/13/2012 22:43  Troponin i, poc 0.06    Discharge Medications   Medication List  As of 03/20/2012  2:01 PM   STOP taking these medications         aspirin 81 MG chewable tablet      colchicine 0.6 MG tablet      metoprolol succinate 50 MG 24 hr tablet      predniSONE 10 MG tablet         TAKE these medications         aspirin 325 MG EC tablet   Take 1 tablet (325 mg total) by mouth daily.  beclomethasone 80 MCG/ACT inhaler   Commonly known as: QVAR   Inhale 1 puff into the lungs 2 (two) times daily.      cetirizine 10 MG tablet   Commonly known as: ZYRTEC   Take 10 mg by mouth daily.      ciprofloxacin 250 MG tablet   Commonly known as: CIPRO   Take 1 tablet (250 mg total) by mouth 2 (two) times daily.      furosemide 20 MG tablet   Commonly known as: LASIX   Take 1 tablet (20 mg total) by mouth 2 (two) times daily.      HYDROcodone-acetaminophen 5-500 MG per tablet   Commonly known as: VICODIN   Take 1 tablet by mouth every 8 (eight) hours as needed for pain.      ondansetron 4 MG tablet   Commonly known as: ZOFRAN   Take 4 mg by mouth 3 (three) times daily as needed. For nausea and vomiting      spironolactone 25 MG tablet   Commonly known as: ALDACTONE   Take 1 tablet (25 mg total) by mouth daily.            Disposition    Discharge Orders    Future Appointments: Provider: Department: Dept Phone: Center:   03/26/2012 10:00 AM [facility] [phone] [facility]   03/27/2012 11:30 AM [facility] [facility] [phone] [facility]   03/27/2012 11:50 AM Beatrice Lecher, PA [facility] [phone] [facility]   04/13/2012 1:00 PM [facility] [phone] [facility]   06/23/2012 11:45 AM Marinus Maw, MD [facility] [phone] [facility]     Future Orders Please Complete By Expires   Amb Referral to Cardiac Rehabilitation      Diet - low sodium heart healthy      Increase activity slowly      Discharge instructions      Comments:   **PLEASE REMEMBER TO BRING ALL OF YOUR MEDICATIONS TO EACH OF YOUR FOLLOW-UP OFFICE VISITS.  * You will be discharged with a prescription for an antibiotic (Ciprofloxacin) for a urinary tract infection. Please take as prescribed. If you have continued painful urination or other urinary symptoms please follow up with your primary care provider.  * Please follow instructions provided to you by cardiothoracic surgery on how to care for your chest incision.     Follow-up Information    Follow up with Tereso Newcomer, PA on 03/27/2012. (Labs at 11:30; Appointment at 11:50)    Contact information:   [facility] [address] [place] [address] [phone]           Outstanding Labs/Studies:  1. BMET at next clinic visit  Duration of Discharge Encounter: Greater than 30 minutes including physician and PA time.  Signed, Estell Puccini PA-C 03/20/2012, 2:01 PM

## 2012-03-20 NOTE — Telephone Encounter (Signed)
Discussed with SALLY PUTT PHARMACIST . PER SALLY PT HAS ICD  WITH HAVING THAT IT IS OKAY FOR PT TO TAKE AMIODARONE AND CIPRO TOGETHER.

## 2012-03-20 NOTE — Progress Notes (Signed)
6 Days Post-Op Procedure(s) (LRB): PERICARDIAL WINDOW (N/A)  Subjective: No new complaints this morning.  Objective: Vital signs in last 24 hours: Temp:  [97.8 F (36.6 C)-99.1 F (37.3 C)] 98.7 F (37.1 C) (06/21 0402) Pulse Rate:  [59-106] 106  (06/21 0402) Cardiac Rhythm:  [-] Normal sinus rhythm;Other (Comment) (06/21 0740) Resp:  [16-20] 17  (06/21 0402) BP: (90-94)/(59-63) 94/59 mmHg (06/21 0402) SpO2:  [94 %-96 %] 94 % (06/21 0402) Weight:  [182 lb 1.6 oz (82.6 kg)] 182 lb 1.6 oz (82.6 kg) (06/21 0402)  Intake/Output from previous day: 06/20 0701 - 06/21 0700 In: 1560 [P.O.:1560] Out: -   General appearance: alert, cooperative and no distress Neurologic: intact Heart: regular rate and rhythm Lungs: clear to auscultation bilaterally Abdomen: soft, non-tender; bowel sounds normal; no masses,  no organomegaly Extremities: edema 2-3+ Wound: clean and dry, staples remain in place  Lab Results: No results found for this basename: WBC:2,HGB:2,HCT:2,PLT:2 in the last 72 hours BMET: No results found for this basename: NA:2,K:2,CL:2,CO2:2,GLUCOSE:2,BUN:2,CREATININE:2,CALCIUM:2 in the last 72 hours  PT/INR: No results found for this basename: LABPROT,INR in the last 72 hours ABG    Component Value Date/Time   PHART 7.398 03/14/2012 1259   HCO3 30.6* 03/14/2012 1259   TCO2 32 03/14/2012 1259   O2SAT 100.0 03/14/2012 1259   CBG (last 3)  No results found for this basename: GLUCAP:3 in the last 72 hours  Assessment/Plan: S/P Procedure(s) (LRB): PERICARDIAL WINDOW (N/A)  2. Pericardial drain removed- ECHO performed shows minimal pericardial effusion 3. Staples remain in place- will have patient follow up with nurse at [facility] to remove staples 4. F/U with Dr. Cornelius Moras in 3 weeks, We will make patient an appointment 5. Management per primary team   LOS: 7 days    [name] 03/20/2012

## 2012-03-20 NOTE — Telephone Encounter (Signed)
New msg [facility] called Pt takes amiodarone and Cipro has an interaction. Please call

## 2012-03-20 NOTE — Progress Notes (Signed)
[id number] Education completed with pt. Gave CHF packet. Referring to [facility] Phase 2. Pt able to walk independently. Cora Brierley DunlapRN

## 2012-03-20 NOTE — Progress Notes (Signed)
D/c home on hold at this time pending lab results; pt still with [facility] [place] central line; per pt request IV team will draw labs from [facility]; will await results and call PA with results prior to d/c.

## 2012-03-21 ENCOUNTER — Telehealth: Payer: Self-pay | Admitting: Physician Assistant

## 2012-03-21 NOTE — Telephone Encounter (Signed)
Patient called concerned that her heart rate is mildly elevated at 112, with a corresponding SBP of 118. She was just discharged yesterday, off beta blocker, secondary to hypotension. She had been on beta blocker for several years and was taking Toprol-XL 50 mg daily, just prior to her recent hospitalization. She is otherwise doing well. I recommended that she resume beta blocker, albeit at the reduced level of 25 mg daily. She is to check her BP/HR just prior, and to hold if SBP less than 100 or HR less than 55. She is agreeable with this recommendation, and was told to contact me for any further questions or concerns.

## 2012-03-23 ENCOUNTER — Other Ambulatory Visit: Payer: Self-pay | Admitting: Thoracic Surgery (Cardiothoracic Vascular Surgery)

## 2012-03-23 DIAGNOSIS — I314 Cardiac tamponade: Secondary | ICD-10-CM

## 2012-03-27 ENCOUNTER — Ambulatory Visit: Payer: Self-pay | Admitting: *Deleted

## 2012-03-27 ENCOUNTER — Other Ambulatory Visit (INDEPENDENT_AMBULATORY_CARE_PROVIDER_SITE_OTHER): Payer: 59

## 2012-03-27 ENCOUNTER — Other Ambulatory Visit: Payer: Self-pay | Admitting: *Deleted

## 2012-03-27 ENCOUNTER — Ambulatory Visit (INDEPENDENT_AMBULATORY_CARE_PROVIDER_SITE_OTHER): Payer: 59 | Admitting: Physician Assistant

## 2012-03-27 ENCOUNTER — Encounter: Payer: Self-pay | Admitting: Physician Assistant

## 2012-03-27 VITALS — BP 88/56 | HR 89 | Ht 60.0 in | Wt 179.0 lb

## 2012-03-27 DIAGNOSIS — I313 Pericardial effusion (noninflammatory): Secondary | ICD-10-CM

## 2012-03-27 DIAGNOSIS — Z4802 Encounter for removal of sutures: Secondary | ICD-10-CM

## 2012-03-27 DIAGNOSIS — G8918 Other acute postprocedural pain: Secondary | ICD-10-CM

## 2012-03-27 DIAGNOSIS — I319 Disease of pericardium, unspecified: Secondary | ICD-10-CM

## 2012-03-27 DIAGNOSIS — I422 Other hypertrophic cardiomyopathy: Secondary | ICD-10-CM

## 2012-03-27 DIAGNOSIS — I82629 Acute embolism and thrombosis of deep veins of unspecified upper extremity: Secondary | ICD-10-CM

## 2012-03-27 DIAGNOSIS — Z09 Encounter for follow-up examination after completed treatment for conditions other than malignant neoplasm: Secondary | ICD-10-CM

## 2012-03-27 DIAGNOSIS — I509 Heart failure, unspecified: Secondary | ICD-10-CM

## 2012-03-27 DIAGNOSIS — R0989 Other specified symptoms and signs involving the circulatory and respiratory systems: Secondary | ICD-10-CM

## 2012-03-27 DIAGNOSIS — I82409 Acute embolism and thrombosis of unspecified deep veins of unspecified lower extremity: Secondary | ICD-10-CM

## 2012-03-27 DIAGNOSIS — I5042 Chronic combined systolic (congestive) and diastolic (congestive) heart failure: Secondary | ICD-10-CM

## 2012-03-27 LAB — BASIC METABOLIC PANEL
BUN: 13 mg/dL (ref 6–23)
CO2: 28 mEq/L (ref 19–32)
Chloride: 100 mEq/L (ref 96–112)
Glucose, Bld: 76 mg/dL (ref 70–99)
Potassium: 4.2 mEq/L (ref 3.5–5.1)
Sodium: 138 mEq/L (ref 135–145)

## 2012-03-27 MED ORDER — HYDROCODONE-ACETAMINOPHEN 5-500 MG PO TABS: 1.0000 | ORAL_TABLET | ORAL | Status: AC | PRN

## 2012-03-27 NOTE — Patient Instructions (Addendum)
Your physician recommends that you schedule a follow-up appointment in: 05/01/12 @ 9:50 AM WITH SCOTT WEAVER, PAC SAME DAY DR. Excell Seltzer IS IN THE OFFICE  Your physician recommends that you return for lab work in: TODAY BMET  Your physician recommends that you return for lab work in: 04/03/12 REPEAT BMET  DECREASE SPIRONOLACTONE TO 12.5 MG DAILY THIS IS 1/2 (HALF) TABLET OF THE 25 MG

## 2012-03-27 NOTE — Progress Notes (Signed)
[name] returns for suture removal of a previous ct site and staple removal of the pericardial window site incision. These are healing well.  The chest tube site is gapping a small amount and I instructed  her to clean with soap and water and then keep dry.  She is monitoring her weight and has minimal le edema.  She has resumed her Metoprolol since her blood pressure has slightly increased since discharge.She has a follow up appointment with Dr. Excell Seltzer and will see Dr. Cornelius Moras with a cxr as scheduled.

## 2012-03-27 NOTE — Progress Notes (Signed)
[address] [place], [place]  [address] Phone: [phone] Fax:  [phone]  Date:  03/27/2012   Name:  [name]   DOB:  [birth year]   MRN:  [id number]  PCP:  Eustaquio Boyden, MD  Primary Cardiologist:  Dr. Tonny Bollman  Primary Electrophysiologist:  Dr. Lewayne Bunting    History of Present Illness: [name] is a 41 y.o. female who returns for post hospital follow up.    She has a h/o HOCM.  She underwent a septal myomectomy and mitral valve repair at [facility] in 12/2011.  Postoperative course was complicated by VT, A. Fib and left UE DVT.  She was placed on amiodarone and had AICD implanted.  She was placed on Coumadin and developed necrotic skin reaction.  This was transitioned over to Pradaxa.  She was admitted 6/2-6/6 with post pericardiotomy syndrome with large pericardial effusion.  Readmitted 6/10-6/12 with pleuritic chest pain.  Echocardiogram demonstrated progression of pericardial effusion.  Pradaxa was discontinued (she had completed roughly 6 weeks of therapy).  She was placed on steroids.  She was then readmitted 6/14-6/21.  She presented with complaints of shortness of breath.  She had developed cardiac tamponade.  She decompensated with hypotension and wide complex tachycardia requiring lidocaine and IV fluids.  She was seen by Dr. Cornelius Moras and taken for emergent pericardial window.  Post op, her blood pressure would not tolerate the addition of ACE inhibitor or continuation of her beta blocker.  She was maintained on Lasix and spironolactone.  Her Pradaxa was not restarted.  Pathology results from her pericardial biopsy were negative for malignancy.  Her last echo was performed 03/19/12 and demonstrated severe asymmetric septal hypertrophy, evidence of upper septal myomectomy, peak LV outflow tract gradient 35, EF 35%, indeterminate diastolic function, no mitral valve SAM, hockey-stick appearance of the mitral valve, moderate MAC, trivial MR, probable mild to  moderate mitral stenosis but no mean gradient measured across the mitral valve, moderate LAE, trivial pericardial effusion.    She presents for follow up today.  There was apparently a question of when to restart her Pradaxa.  I did call Dr. Excell Seltzer, who knows her well.  Given her hemorrhagic effusion progressing to cardiac tamponade, we decided to hold off on full anticoagulation for now.  I did review the notes after she left the office.  Dr. Eden Emms saw her quite often in the hospital and I will also touch base with him to get his thoughts.    She continues to have fairly significant chest soreness.  Breathing is much better. Sleeps in a recliner due to pain.  No PND.  LE edema much improved.  Weights are going down.  No syncope.  No ICD Rx.  Saw RN at [facility] today for staple removal.    Wt Readings from Last 3 Encounters:  03/27/12 179 lb (81.194 kg)  03/20/12 182 lb 1.6 oz (82.6 kg)  03/20/12 182 lb 1.6 oz (82.6 kg)     Potassium  Date/Time Value Range Status  03/20/2012 12:30 PM 3.5  3.5 - 5.1 mEq/L Final     Creatinine, Ser  Date/Time Value Range Status  03/20/2012 12:30 PM 0.67  0.50 - 1.10 mg/dL Final     ALT  Date/Time Value Range Status  03/13/2012  7:11 PM 26  0 - 35 U/L Final     TSH  Date/Time Value Range Status  03/09/2012  2:59 PM 13.624* 0.350 - 4.500 uIU/mL Final     Hemoglobin  Date/Time Value Range Status  03/16/2012  4:50 AM 9.4* 12.0 - 15.0 g/dL Final    Past Medical History  Diagnosis Date  . Hypertrophic cardiomyopathy     s/p septal myomectomy with implantable defibrillator 01/27/2012   . Anxiety state, unspecified   . Allergic rhinitis     to dogs  . Asthma   . History of anemia     during pregnancy  . History of chicken pox   . Generalized headaches   . Ocular migraine     no HA  . Back pain     told cracked bone, vicodin didn't help, improved with percocets/muscle relaxants, no eval by prior PCP  . 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 DVT (deep venous thrombosis)     Diagnosed 02/13/12  - was placed on Warfarin after cardiac surgery but had skin reaction to warfarin so was changed to Pradaxa so DVT occurred while on Pradaxa although unclear if it occurred during transition  . Hypotension   . Atrial fibrillation     a. On pradaxa.  coumadin necrosis with warfarin;  b. Amio d/c'd 02/2012  . ICD (implantable cardiac defibrillator) in place     02/04/2012, [employer]  . Pericardial effusion   . Chronic combined systolic and diastolic CHF (congestive heart failure)     echo 03/19/12: severe asymmetric septal hypertrophy, evidence of upper septal myomectomy, peak LV outflow tract gradient 35, EF 35%, indeterminate diastolic function, no mitral valve SAM, hockey-stick appearance of the mitral valve, moderate MAC, trivial MR, probable mild to moderate mitral stenosis but no mean gradient measured across the mitral valve, moderate LAE, trivial pericardial effusion.   . Pericardial tamponade 03/14/2012    Current Outpatient Prescriptions  Medication Sig Dispense Refill  . aspirin EC 325 MG EC tablet Take 1 tablet (325 mg total) by mouth daily.  30 tablet    . beclomethasone (QVAR) 80 MCG/ACT inhaler Inhale 1 puff into the lungs 2 (two) times daily.      . cetirizine (ZYRTEC) 10 MG tablet Take 10 mg by mouth daily.        . furosemide (LASIX) 20 MG tablet Take 1 tablet (20 mg total) by mouth 2 (two) times daily.  60 tablet  3  . HYDROcodone-acetaminophen (VICODIN) 5-500 MG per tablet Take 1 tablet by mouth every 4 (four) hours as needed for pain.  40 tablet  0  . metoprolol (LOPRESSOR) 50 MG tablet Take 25 mg by mouth 1 day or 1 dose. 1/2 TAB DAILY      . ondansetron (ZOFRAN) 4 MG tablet Take 4 mg by mouth 3 (three) times daily as needed. For nausea and vomiting      . spironolactone (ALDACTONE) 25 MG tablet Take 1 tablet (25 mg total) by mouth daily.  30 tablet  3     Allergies: Allergies  Allergen Reactions  . Warfarin And Related     Skin reaction    History  Substance Use Topics  . Smoking status: Former Smoker -- 1.0 packs/day for 3 years    Types: Cigarettes    Quit date: 09/30/1988  . Smokeless tobacco: Never Used   Comment: quitin 1990  . Alcohol Use: 0.0 oz/week     rarely drinks wine     ROS:  Please see the history of present illness.   No fevers, chills, cough, melena, hematochezia diarrhea, lightheadedness or dizziness.  All other  systems reviewed and negative.   PHYSICAL EXAM: VS:  BP 88/56  Pulse 89  Ht 5' (1.524 m)  Wt 179 lb (81.194 kg)  BMI 34.96 kg/m2  LMP 02/25/2012 Well nourished, well developed, in no acute distress HEENT: normal Neck: no JVD Cardiac:  normal S1, S2; RRR; 2/6 Systolic murmur along LSB Lungs:  Decreased breath sounds bilaterally, no wheezing, rhonchi or rales Abd: soft, nontender, no hepatomegaly Ext: Trace-1+ bilateral pedal edema Skin: warm and dry Neuro:  CNs 2-12 intact, no focal abnormalities noted  EKG:  Sinus rhythm, heart rate 89, left bundle branch block   ASSESSMENT AND PLAN:  1.  Hemorrhagic Pericardial Effusion She is doing well status post pericardial window.  Postoperative echocardiogram demonstrated resolution of her effusion.  She is feeling much better.  She has followup with Dr. Cornelius Moras in several weeks.  2.  Chronic Combined Systolic and Diastolic Congestive Heart Failure Her blood pressure is too soft to advance her medications any further.  Luckily, she is not that symptomatic.  She only takes metoprolol once a day.  I will decrease her spironolactone to 25 mg one-half a tablet daily.  Check a basic metabolic panel today and repeat in one week.  Follow up in 4 weeks with either Dr. Excell Seltzer or me.  Hopefully, her blood pressure will be higher at follow up and we can adjust her medications further (increased beta blocker; add ACE inhibitors).  3.  Hypertrophic Obstructive  Cardiomyopathy, status post myomectomy 12/2011 She has been released by her surgeons at [facility].  4.  Left Upper Extremity DVT As noted, I reviewed the case today with Dr. Excell Seltzer.  We elected to not restart her Pradaxa at this time.  As noted, Dr. Eden Emms did see her quite often in the hospital.  I will touch base with him as well so that we can further review her case and decide if and when to restart Pradaxa.  5.  Ventricular tachycardia, status post AICD She has followup with Dr. Ladona Ridgel in September.  Luna Glasgow, PA-C  11:32 AM 03/27/2012

## 2012-03-30 ENCOUNTER — Telehealth: Payer: Self-pay | Admitting: Cardiovascular Disease

## 2012-03-30 NOTE — Telephone Encounter (Signed)
Pt recently seen in the office by Tereso Newcomer PA-C on 03/27/12.  I will forward this message to him to make a decision if our office can clear the pt to drive or if [facility] should make decision about driving.  The pt has an upcoming appt with [facility] on 04/13/12.

## 2012-03-30 NOTE — Telephone Encounter (Signed)
Please return call to patient at [phone] regarding the date she can resume driving privileges.

## 2012-03-31 NOTE — Telephone Encounter (Signed)
I believe she should be cleared by surgery. Tereso Newcomer, PA-C  8:17 AM 03/31/2012

## 2012-03-31 NOTE — Telephone Encounter (Signed)
I spoke with the pt and made her aware that we recommend [facility] clear the pt to drive.  The pt said she has spoken with [facility] and the nurse told her to contact our office.  I made her aware that she should keep her appointment with [facility] on 04/13/12 and at that time a determination can be made if she is okay to drive. Pt agreed with plan.

## 2012-04-03 ENCOUNTER — Other Ambulatory Visit (INDEPENDENT_AMBULATORY_CARE_PROVIDER_SITE_OTHER): Payer: 59

## 2012-04-03 ENCOUNTER — Other Ambulatory Visit (HOSPITAL_COMMUNITY): Payer: 59

## 2012-04-03 ENCOUNTER — Other Ambulatory Visit: Payer: 59

## 2012-04-03 ENCOUNTER — Telehealth: Payer: Self-pay | Admitting: Internal Medicine

## 2012-04-03 DIAGNOSIS — I5042 Chronic combined systolic (congestive) and diastolic (congestive) heart failure: Secondary | ICD-10-CM

## 2012-04-03 DIAGNOSIS — I509 Heart failure, unspecified: Secondary | ICD-10-CM

## 2012-04-03 LAB — BASIC METABOLIC PANEL
BUN: 18 mg/dL (ref 6–23)
CO2: 28 mEq/L (ref 19–32)
Calcium: 9.3 mg/dL (ref 8.4–10.5)
Chloride: 102 mEq/L (ref 96–112)
Creatinine, Ser: 0.7 mg/dL (ref 0.4–1.2)
Glucose, Bld: 134 mg/dL — ABNORMAL HIGH (ref 70–99)

## 2012-04-03 NOTE — Telephone Encounter (Signed)
Pt rtn call to get results °

## 2012-04-03 NOTE — Telephone Encounter (Signed)
Spoke with pt, aware of lab results. 

## 2012-04-07 ENCOUNTER — Telehealth: Payer: Self-pay | Admitting: Physician Assistant

## 2012-04-07 DIAGNOSIS — M79602 Pain in left arm: Secondary | ICD-10-CM

## 2012-04-07 NOTE — Telephone Encounter (Signed)
Reviewed with Dr. Eden Emms and Dr. Excell Seltzer. After speaking with Dr. Excell Seltzer, we have decided to get a follow up ultrasound of her left arm. Please arrange LUE venous duplex this week. This will help [place] see if clot still present and decide whether or not to restart Pradaxa. Make sure copy of report goes to Dr. Excell Seltzer as well. Tereso Newcomer, PA-C  8:45 AM 04/07/2012

## 2012-04-07 NOTE — Telephone Encounter (Signed)
S/w pt about needing to have LUE to evaluate clot. PA s/w pt as well today and LUE will be done 7/18 @ 2 pm, pt aware of appt

## 2012-04-09 ENCOUNTER — Other Ambulatory Visit: Payer: Self-pay | Admitting: Thoracic Surgery (Cardiothoracic Vascular Surgery)

## 2012-04-09 DIAGNOSIS — I314 Cardiac tamponade: Secondary | ICD-10-CM

## 2012-04-10 ENCOUNTER — Ambulatory Visit
Admission: RE | Admit: 2012-04-10 | Discharge: 2012-04-10 | Disposition: A | Discharge status: Home / Self Care | Payer: 59 | Source: Ambulatory Visit | Attending: Thoracic Surgery (Cardiothoracic Vascular Surgery) | Admitting: Thoracic Surgery (Cardiothoracic Vascular Surgery)

## 2012-04-10 DIAGNOSIS — I314 Cardiac tamponade: Secondary | ICD-10-CM

## 2012-04-13 ENCOUNTER — Ambulatory Visit (INDEPENDENT_AMBULATORY_CARE_PROVIDER_SITE_OTHER): Payer: Self-pay | Admitting: Surgical

## 2012-04-13 VITALS — BP 94/56 | HR 92 | Resp 20 | Ht 60.0 in | Wt 183.0 lb

## 2012-04-13 DIAGNOSIS — Z09 Encounter for follow-up examination after completed treatment for conditions other than malignant neoplasm: Secondary | ICD-10-CM

## 2012-04-13 DIAGNOSIS — I314 Cardiac tamponade: Secondary | ICD-10-CM

## 2012-04-13 NOTE — Patient Instructions (Addendum)
Patient was instructed on driving protocol. She knows not to begin driving until she is no longer using her narcotics.

## 2012-04-13 NOTE — Progress Notes (Signed)
PCP is Eustaquio Boyden, MD Referring Provider is Tonny Bollman, MD  Chief Complaint  Patient presents with  . Routine Post Op    4 week f/u from surgery with CXR, S/P subxyphoid pericardial window on 03/14/12    HPI: The patient is seen in routine followup on today's date. She reports she continues to have a fair amount of pain in the sternal incision. Shortness of breath is significantly improved since the subxiphoid pericardial window. She is otherwise continuing to improve in regards to her overall recovery.   Past Medical History  Diagnosis Date  . Hypertrophic cardiomyopathy     s/p septal myomectomy with implantable defibrillator 01/27/2012   . Anxiety state, unspecified   . Allergic rhinitis     to dogs  . Asthma   . History of anemia     during pregnancy  . History of chicken pox   . Generalized headaches   . Ocular migraine     no HA  . Back pain     told cracked bone, vicodin didn't help, improved with percocets/muscle relaxants, no eval by prior PCP  . 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 DVT (deep venous thrombosis)     Diagnosed 02/13/12  - was placed on Warfarin after cardiac surgery but had skin reaction to warfarin so was changed to Pradaxa so DVT occurred while on Pradaxa although unclear if it occurred during transition  . Hypotension   . Atrial fibrillation     a. On pradaxa.  coumadin necrosis with warfarin;  b. Amio d/c'd 02/2012  . ICD (implantable cardiac defibrillator) in place     02/04/2012, [employer]  . Pericardial effusion   . Chronic combined systolic and diastolic CHF (congestive heart failure)     echo 03/19/12: severe asymmetric septal hypertrophy, evidence of upper septal myomectomy, peak LV outflow tract gradient 35, EF 35%, indeterminate diastolic function, no mitral valve SAM, hockey-stick appearance of the mitral valve, moderate MAC, trivial MR, probable mild to moderate mitral  stenosis but no mean gradient measured across the mitral valve, moderate LAE, trivial pericardial effusion.   . Pericardial tamponade 03/14/2012    Past Surgical History  Procedure Date  . Cesarean section 1991  . Induced abortion 1990 and 1993  . US echocardiography 08/2011    severe LVH, severe asymmetric hypertrophy, EF 50-55%, grade 2 diastolic dysfunction  . Cardiac surgery 01/27/2012    median sternotomy, septal myectomy, MVR - [facility] (Dr. Silvestre Mesi)  . Cardiac defibrillator placement 01/2012  . Myomectomy   . Pericardial window 03/14/2012    Procedure: PERICARDIAL WINDOW;  Surgeon: Purcell Nails, MD;  Location: [facility] OR;  Service: Open Heart Surgery;  Laterality: N/A;  Subxyphoid Pericardial  Window     Family History  Problem Relation Age of Onset  . Diabetes Father   . Heart disease Father     unspecified heart problem  . Thyroid disease Mother   . Anemia Mother     mediterranean anemia, ITP  . Heart disease Maternal Grandfather     CHF  . Diabetes Paternal Grandfather   . Cancer Paternal Grandfather   . Coronary artery disease Neg Hx   . Stroke Neg Hx   . Sudden death Neg Hx     Social History History  Substance Use Topics  . Smoking status: Former Smoker -- 1.0 packs/day for 3 years    Types: Cigarettes    Quit  date: 09/30/1988  . Smokeless tobacco: Never Used   Comment: quitin 1990  . Alcohol Use: 0.0 oz/week     rarely drinks wine    Current Outpatient Prescriptions  Medication Sig Dispense Refill  . beclomethasone (QVAR) 80 MCG/ACT inhaler Inhale 1 puff into the lungs 2 (two) times daily.      . cetirizine (ZYRTEC) 10 MG tablet Take 10 mg by mouth daily.        . furosemide (LASIX) 20 MG tablet Take 1 tablet (20 mg total) by mouth 2 (two) times daily.  60 tablet  3  . HYDROcodone-acetaminophen (VICODIN) 5-500 MG per tablet Take 1 tablet by mouth every 6 (six) hours as needed.      . metoprolol (LOPRESSOR) 50 MG tablet Take 25 mg by mouth 1 day or 1 dose. 1/2  TAB DAILY      . ondansetron (ZOFRAN) 4 MG tablet Take 4 mg by mouth 3 (three) times daily as needed. For nausea and vomiting      . spironolactone (ALDACTONE) 25 MG tablet Take 0.5 tablets (12.5 mg total) by mouth daily.        Allergies  Allergen Reactions  . Warfarin And Related     Skin reaction    Review of Systems otherwise noncontributory  BP 94/56  Pulse 92  Resp 20  Ht 5' (1.524 m)  Wt 183 lb (83.008 kg)  BMI 35.74 kg/m2  SpO2 98% Physical Exam general-well-developed adult female in no acute distress Pulmonary clear lung fields without rales or rhonchi Cardiac regular rate and rhythm 2/6 holosystolic murmur Extremities 1+ bilateral lower chili edema Incision healing well without evidence of infection   Diagnostic Tests: A chest x-ray was reviewed from 04/10/2012. There is no frank pulmonary edema but some evidence of congestion.   Impression: She is improving is steadily. She will continue on her current Lasix dose as well as her spironolactone  I prescribed her oxycodone 5 mg Q4 hours when necessary #40.   Plan: We'll see in 4 weeks. She is concerned about returning to work at her job is quite physical. She works as a Production designer, theatre/television/film at a [employer]

## 2012-04-16 ENCOUNTER — Encounter (INDEPENDENT_AMBULATORY_CARE_PROVIDER_SITE_OTHER): Payer: 59

## 2012-04-16 DIAGNOSIS — Z86718 Personal history of other venous thrombosis and embolism: Secondary | ICD-10-CM

## 2012-04-16 DIAGNOSIS — I808 Phlebitis and thrombophlebitis of other sites: Secondary | ICD-10-CM

## 2012-04-16 DIAGNOSIS — M79602 Pain in left arm: Secondary | ICD-10-CM

## 2012-04-22 ENCOUNTER — Encounter: Payer: Self-pay | Admitting: Cardiovascular Disease

## 2012-04-22 NOTE — Telephone Encounter (Signed)
Fu call °Pt returning your call  °

## 2012-04-22 NOTE — Telephone Encounter (Signed)
This encounter was created in error - please disregard.

## 2012-04-27 ENCOUNTER — Other Ambulatory Visit: Payer: Self-pay | Admitting: *Deleted

## 2012-04-27 DIAGNOSIS — G8918 Other acute postprocedural pain: Secondary | ICD-10-CM

## 2012-04-27 MED ORDER — HYDROCODONE-ACETAMINOPHEN 7.5-500 MG PO TABS: 1.0000 | ORAL_TABLET | Freq: Four times a day (QID) | ORAL | Status: AC | PRN

## 2012-05-01 ENCOUNTER — Ambulatory Visit (INDEPENDENT_AMBULATORY_CARE_PROVIDER_SITE_OTHER): Payer: 59 | Admitting: Physician Assistant

## 2012-05-01 ENCOUNTER — Encounter: Payer: Self-pay | Admitting: Physician Assistant

## 2012-05-01 VITALS — BP 102/60 | HR 79 | Ht 60.0 in | Wt 181.4 lb

## 2012-05-01 DIAGNOSIS — I314 Cardiac tamponade: Secondary | ICD-10-CM

## 2012-05-01 DIAGNOSIS — I428 Other cardiomyopathies: Secondary | ICD-10-CM

## 2012-05-01 DIAGNOSIS — I509 Heart failure, unspecified: Secondary | ICD-10-CM

## 2012-05-01 DIAGNOSIS — I422 Other hypertrophic cardiomyopathy: Secondary | ICD-10-CM

## 2012-05-01 DIAGNOSIS — R079 Chest pain, unspecified: Secondary | ICD-10-CM

## 2012-05-01 DIAGNOSIS — I5042 Chronic combined systolic (congestive) and diastolic (congestive) heart failure: Secondary | ICD-10-CM

## 2012-05-01 DIAGNOSIS — I82629 Acute embolism and thrombosis of deep veins of unspecified upper extremity: Secondary | ICD-10-CM

## 2012-05-01 LAB — BASIC METABOLIC PANEL
BUN: 19 mg/dL (ref 6–23)
Creatinine, Ser: 0.8 mg/dL (ref 0.4–1.2)
GFR: 86.26 mL/min (ref >60.00)
Glucose, Bld: 87 mg/dL (ref 70–99)
Potassium: 3.9 mEq/L (ref 3.5–5.1)

## 2012-05-01 NOTE — Progress Notes (Signed)
[address] [place], [place]  [address] Phone: [phone] Fax:  [phone]  Date:  05/01/2012   Name:  [name]   DOB:  Jan 14, 1971   MRN:  [id number]  PCP:  Eustaquio Boyden, MD  Primary Cardiologist:  Dr. Tonny Bollman  Primary Electrophysiologist:  Dr. Lewayne Bunting    History of Present Illness: [name] is a 41 y.o. female who returns for follow up.    She has a h/o HOCM.  She underwent a septal myomectomy and mitral valve repair at [facility] in 12/2011.  Postoperative course was c/b VT, A. Fib and left UE DVT.  She had AICD implanted.  She was placed on Coumadin and developed necrotic skin reaction that was transitioned over to Pradaxa.  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 She developed a hemorrhagic effusion. Her Pradaxa was discontinued. She eventually progressed to tamponade and ultimately underwent pericardial window with Dr. Cornelius Moras. She had difficulty with hypotension. Echo 03/19/12: Severe asymmetric septal hypertrophy, evidence of upper septal myomectomy, peak LV outflow tract gradient 35, EF 35%, indeterminate diastolic function, no mitral valve SAM, hockey-stick appearance of the mitral valve, moderate MAC, trivial MR, probable mild to moderate mitral stenosis but no mean gradient measured across the mitral valve, moderate LAE, trivial pericardial effusion.  I saw her in followup 6/28. I reviewed her case with Dr. Excell Seltzer and Dr. Eden Emms. We ultimately decided to proceed with repeat left upper extremity venous duplex. This was negative and therefore, we kept her off of Pradaxa.  She continues to progress slowly. She has a lot of chest soreness. She also has noted some anxiety as well as headaches especially at night. Driving is painful. She also has pain around her ICD site. She's continuing to sleep on an incline. She denies PND. She has mild pedal edema. Her breathing is fairly stable. She denies syncope. She has been  unable to start cardiac rehabilitation due to transportation issues.  Wt Readings from Last 3 Encounters:  05/01/12 181 lb 6.4 oz (82.283 kg)  04/13/12 183 lb (83.008 kg)  03/27/12 179 lb (81.194 kg)   Potassium  Date/Time Value Range Status  04/03/2012  8:33 AM 4.2  3.5 - 5.1 mEq/L Final   Creatinine, Ser  Date/Time Value Range Status  04/03/2012  8:33 AM 0.7  0.4 - 1.2 mg/dL Final    Past Medical History  Diagnosis Date  . Hypertrophic cardiomyopathy     s/p septal myomectomy with implantable defibrillator 01/27/2012   . Anxiety state, unspecified   . Allergic rhinitis     to dogs  . Asthma   . History of anemia     during pregnancy  . History of chicken pox   . Generalized headaches   . Ocular migraine     no HA  . Back pain     told cracked bone, vicodin didn't help, improved with percocets/muscle relaxants, no eval by prior PCP  . 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 DVT (deep venous thrombosis)     Diagnosed 02/13/12  - was placed on Warfarin after cardiac surgery but had skin reaction to warfarin so was changed to Pradaxa so DVT occurred while on Pradaxa although unclear if it occurred during transition  . Hypotension   . Atrial fibrillation     a. On pradaxa.  coumadin necrosis with warfarin;  b. Amio d/c'd 02/2012  .  ICD (implantable cardiac defibrillator) in place     02/04/2012, [employer]  . Pericardial effusion   . Chronic combined systolic and diastolic CHF (congestive heart failure)     echo 03/19/12: severe asymmetric septal hypertrophy, evidence of upper septal myomectomy, peak LV outflow tract gradient 35, EF 35%, indeterminate diastolic function, no mitral valve SAM, hockey-stick appearance of the mitral valve, moderate MAC, trivial MR, probable mild to moderate mitral stenosis but no mean gradient measured across the mitral valve, moderate LAE, trivial pericardial effusion.   . Pericardial tamponade  03/14/2012    Current Outpatient Prescriptions  Medication Sig Dispense Refill  . beclomethasone (QVAR) 80 MCG/ACT inhaler Inhale 1 puff into the lungs 2 (two) times daily.      . cetirizine (ZYRTEC) 10 MG tablet Take 10 mg by mouth daily.        . furosemide (LASIX) 20 MG tablet Take 1 tablet (20 mg total) by mouth 2 (two) times daily.  60 tablet  3  . HYDROcodone-acetaminophen (LORTAB 7.5) 7.5-500 MG per tablet Take 1 tablet by mouth every 6 (six) hours as needed for pain (may take one tab every 4-6 hrs prn pain).  40 tablet  0  . metoprolol (LOPRESSOR) 50 MG tablet Take 25 mg by mouth 1 day or 1 dose. 1/2 TAB DAILY      . ondansetron (ZOFRAN) 4 MG tablet Take 4 mg by mouth 3 (three) times daily as needed. For nausea and vomiting      . spironolactone (ALDACTONE) 25 MG tablet Take 0.5 tablets (12.5 mg total) by mouth daily.        Allergies: Allergies  Allergen Reactions  . Warfarin And Related     Skin reaction    History  Substance Use Topics  . Smoking status: Former Smoker -- 1.0 packs/day for 3 years    Types: Cigarettes    Quit date: 09/30/1988  . Smokeless tobacco: Never Used   Comment: quitin 1990  . Alcohol Use: 0.0 oz/week     rarely drinks wine     PHYSICAL EXAM: VS:  BP 102/60  Pulse 79  Ht 5' (1.524 m)  Wt 181 lb 6.4 oz (82.283 kg)  BMI 35.43 kg/m2 Well nourished, well developed, in no acute distress HEENT: normal Neck: no JVD Cardiac:  normal S1, S2; RRR; 2/6 Systolic murmur along LSB Chest: ICD site stable without erythema or discharge Lungs:  Decreased breath sounds bilaterally, no wheezing, rhonchi or rales Abd: soft, nontender, no hepatomegaly Ext: Trace bilateral pedal edema Skin: warm and dry Neuro:  CNs 2-12 intact, no focal abnormalities noted  EKG:  Sinus rhythm, heart rate 84, left bundle branch block  ASSESSMENT AND PLAN:  1.  Hemorrhagic Pericardial Effusion Followup with Dr. Cornelius Moras as planned.  2.  Chronic Combined Systolic and  Diastolic Congestive Heart Failure Her blood pressure is too soft to advance her medications any further.   Volume stable. Check a basic metabolic panel today. Followup with Dr. Excell Seltzer in 3 months.  3.  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 Given her symptoms, she needs at least another 3 months out of work to help recover. We can help her with any disability paperwork that she needs. I have asked her to try to use either Tylenol or ibuprofen for pain instead of the Lortab. We discussed the possibility of rebound headaches and rebound pain with chronic use of narcotics.  4.  Left Upper Extremity DVT  As noted, recent duplex with no evidence of DVT.  5.  Ventricular tachycardia, status post AICD She has followup with Dr. Ladona Ridgel in September. Question if she would be a candidate for upgrade to a by the pacer. We will obtain a followup echocardiogram prior to her seeing Dr. Ladona Ridgel.  Luna Glasgow, PA-C  9:58 AM 05/01/2012

## 2012-05-01 NOTE — Patient Instructions (Addendum)
Your physician wants you to follow-up in 3 months with Dr. Excell Seltzer  Your physician has requested that you have an echocardiogram. Echocardiography is a painless test that uses sound waves to create images of your heart. It provides your doctor with information about the size and shape of your heart and how well your heart's chambers and valves are working. This procedure takes approximately one hour. There are no restrictions for this procedure. To be done in early September  Try tylenol or Ibuprofen instead of Lortab as needed for pain.  Follow label instructions.

## 2012-05-11 ENCOUNTER — Ambulatory Visit (INDEPENDENT_AMBULATORY_CARE_PROVIDER_SITE_OTHER): Payer: Self-pay | Admitting: Thoracic Surgery (Cardiothoracic Vascular Surgery)

## 2012-05-11 ENCOUNTER — Encounter: Payer: Self-pay | Admitting: Thoracic Surgery (Cardiothoracic Vascular Surgery)

## 2012-05-11 VITALS — BP 103/74 | HR 89 | Resp 18 | Ht 60.0 in | Wt 183.0 lb

## 2012-05-11 DIAGNOSIS — Z09 Encounter for follow-up examination after completed treatment for conditions other than malignant neoplasm: Secondary | ICD-10-CM

## 2012-05-11 NOTE — Patient Instructions (Signed)
Avoid any sort of straining or heavy lifting or strenuous use of her arms or shoulders for at least another 6-8 weeks

## 2012-05-11 NOTE — Progress Notes (Signed)
                   [address]            [name] [address]          [phone]     CARDIOTHORACIC SURGERY OFFICE NOTE  Referring Provider is Tonny Bollman, MD PCP is Eustaquio Boyden, MD   HPI:  Patient returns for followup status post subxiphoid pericardial window on 03/14/2012.  She originally underwent septal myomectomy and mitral valve repair by Dr Gasper Lloyd at [facility] on 01/27/2012. Her postoperative course at [facility] was complicated by CHF, pleural effusion requiring drainage, placement of ICD, DVT, skin necrosis left forearm attributed to coumadin, and pericardial effusion.  She was hospitalized at [facility] in class IV CHF with pericardial tamponade on 03/14/2012.  Since then she has gradually improved. She was seen last here in our office on 04/13/2012. Since then she has continued to gradually improve, although she continues to complain of pain across her upper chest and particularly around her defibrillator. She states that her breathing is stable and she has not had any more problems with severe exertional shortness of breath. Her primary complaint is chest wall discomfort which is exacerbated by movement and cough.    Current Outpatient Prescriptions  Medication Sig Dispense Refill  . beclomethasone (QVAR) 80 MCG/ACT inhaler Inhale 1 puff into the lungs 2 (two) times daily.      . cetirizine (ZYRTEC) 10 MG tablet Take 10 mg by mouth daily.        . furosemide (LASIX) 20 MG tablet Take 1 tablet (20 mg total) by mouth 2 (two) times daily.  60 tablet  3  . metoprolol (LOPRESSOR) 50 MG tablet Take 25 mg by mouth 1 day or 1 dose. 1/2 TAB DAILY      . spironolactone (ALDACTONE) 25 MG tablet Take 0.5 tablets (12.5 mg total) by mouth daily.      . ondansetron (ZOFRAN) 4 MG tablet Take 4 mg by mouth 3 (three) times daily as needed. For nausea and vomiting          Physical Exam:   BP 103/74  Pulse 89  Resp 18  Ht 5' (1.524 m)  Wt 183 lb (83.008 kg)  BMI 35.74  kg/m2  SpO2 98%  General:  Well-appearing  Chest:   Clear to auscultation with symmetrical breath sounds  CV:   Regular rate and rhythm without murmur  Incisions:  All surgical scars are healing nicely. Sternum is stable on palpation but palpation does elicit pain.  The subxyphoid incision is healing well.  Abdomen:  Soft and nontender.  Extremities:  Warm and well-perfused  Diagnostic Tests:  n/a   Impression:  The patient has recovered uneventfully with respect to her subxiphoid pericardial window.  Plan:  The patient has been advised to continue to refrain from any heavy lifting or strenuous use of her arms or shoulders for least another 6-8 weeks. All of her questions been addressed. In the future she will call and return to see [place] as needed.   Salvatore Decent. Cornelius Moras, MD 05/11/2012 3:21 PM

## 2012-05-19 ENCOUNTER — Other Ambulatory Visit: Payer: Self-pay | Admitting: Thoracic Surgery (Cardiothoracic Vascular Surgery)

## 2012-05-19 ENCOUNTER — Other Ambulatory Visit: Payer: Self-pay

## 2012-05-19 DIAGNOSIS — G8918 Other acute postprocedural pain: Secondary | ICD-10-CM

## 2012-06-05 ENCOUNTER — Ambulatory Visit (HOSPITAL_COMMUNITY): Payer: Medicaid Other | Attending: Cardiology

## 2012-06-05 ENCOUNTER — Other Ambulatory Visit: Payer: Self-pay | Admitting: Surgical

## 2012-06-05 ENCOUNTER — Other Ambulatory Visit: Payer: Self-pay | Admitting: Thoracic Surgery (Cardiothoracic Vascular Surgery)

## 2012-06-05 ENCOUNTER — Other Ambulatory Visit: Payer: Self-pay | Admitting: Cardiovascular Disease

## 2012-06-05 DIAGNOSIS — I422 Other hypertrophic cardiomyopathy: Secondary | ICD-10-CM

## 2012-06-05 DIAGNOSIS — I079 Rheumatic tricuspid valve disease, unspecified: Secondary | ICD-10-CM | POA: Insufficient documentation

## 2012-06-05 DIAGNOSIS — I059 Rheumatic mitral valve disease, unspecified: Secondary | ICD-10-CM

## 2012-06-05 DIAGNOSIS — I421 Obstructive hypertrophic cardiomyopathy: Secondary | ICD-10-CM | POA: Insufficient documentation

## 2012-06-05 DIAGNOSIS — I319 Disease of pericardium, unspecified: Secondary | ICD-10-CM | POA: Insufficient documentation

## 2012-06-05 DIAGNOSIS — I379 Nonrheumatic pulmonary valve disorder, unspecified: Secondary | ICD-10-CM | POA: Insufficient documentation

## 2012-06-05 DIAGNOSIS — I509 Heart failure, unspecified: Secondary | ICD-10-CM | POA: Insufficient documentation

## 2012-06-05 DIAGNOSIS — Z87891 Personal history of nicotine dependence: Secondary | ICD-10-CM | POA: Insufficient documentation

## 2012-06-05 DIAGNOSIS — I052 Rheumatic mitral stenosis with insufficiency: Secondary | ICD-10-CM | POA: Insufficient documentation

## 2012-06-05 NOTE — Progress Notes (Signed)
Echocardiogram performed.  

## 2012-06-05 NOTE — Telephone Encounter (Signed)
Message copied by Tarri Fuller on Fri Jun 05, 2012  2:16 PM ------      Message from: [place], [place] T      Created: Fri Jun 05, 2012 12:07 PM       EF improved.      Tereso Newcomer, PA-C  12:07 PM 06/05/2012

## 2012-06-05 NOTE — Telephone Encounter (Signed)
lmom echo better and EF better

## 2012-06-12 ENCOUNTER — Ambulatory Visit (INDEPENDENT_AMBULATORY_CARE_PROVIDER_SITE_OTHER): Payer: Medicaid Other | Admitting: Cardiology

## 2012-06-12 ENCOUNTER — Encounter: Payer: Self-pay | Admitting: *Deleted

## 2012-06-12 ENCOUNTER — Encounter: Payer: Self-pay | Admitting: Cardiology

## 2012-06-12 ENCOUNTER — Telehealth: Payer: Self-pay | Admitting: Internal Medicine

## 2012-06-12 ENCOUNTER — Encounter (HOSPITAL_COMMUNITY): Payer: Self-pay

## 2012-06-12 ENCOUNTER — Encounter: Payer: Self-pay | Admitting: Internal Medicine

## 2012-06-12 ENCOUNTER — Inpatient Hospital Stay (HOSPITAL_COMMUNITY): Payer: Medicaid Other

## 2012-06-12 ENCOUNTER — Inpatient Hospital Stay (HOSPITAL_COMMUNITY)
Admission: AD | Admit: 2012-06-12 | Discharge: 2012-06-16 | DRG: 309 | Disposition: A | Discharge status: Home / Self Care | Payer: Medicaid Other | Source: Ambulatory Visit | Attending: Cardiology | Admitting: Cardiology

## 2012-06-12 VITALS — BP 110/66 | HR 82 | Ht 60.0 in | Wt 185.4 lb

## 2012-06-12 DIAGNOSIS — I422 Other hypertrophic cardiomyopathy: Secondary | ICD-10-CM

## 2012-06-12 DIAGNOSIS — I509 Heart failure, unspecified: Secondary | ICD-10-CM | POA: Diagnosis present

## 2012-06-12 DIAGNOSIS — I5042 Chronic combined systolic (congestive) and diastolic (congestive) heart failure: Secondary | ICD-10-CM

## 2012-06-12 DIAGNOSIS — I4901 Ventricular fibrillation: Secondary | ICD-10-CM

## 2012-06-12 DIAGNOSIS — J45909 Unspecified asthma, uncomplicated: Secondary | ICD-10-CM | POA: Diagnosis present

## 2012-06-12 DIAGNOSIS — I472 Ventricular tachycardia, unspecified: Principal | ICD-10-CM | POA: Diagnosis present

## 2012-06-12 DIAGNOSIS — I4891 Unspecified atrial fibrillation: Secondary | ICD-10-CM

## 2012-06-12 DIAGNOSIS — Z95810 Presence of automatic (implantable) cardiac defibrillator: Secondary | ICD-10-CM

## 2012-06-12 DIAGNOSIS — F411 Generalized anxiety disorder: Secondary | ICD-10-CM | POA: Diagnosis present

## 2012-06-12 DIAGNOSIS — Z833 Family history of diabetes mellitus: Secondary | ICD-10-CM

## 2012-06-12 DIAGNOSIS — Z8249 Family history of ischemic heart disease and other diseases of the circulatory system: Secondary | ICD-10-CM

## 2012-06-12 DIAGNOSIS — I4729 Other ventricular tachycardia: Principal | ICD-10-CM | POA: Diagnosis present

## 2012-06-12 DIAGNOSIS — Z87891 Personal history of nicotine dependence: Secondary | ICD-10-CM

## 2012-06-12 DIAGNOSIS — Z86718 Personal history of other venous thrombosis and embolism: Secondary | ICD-10-CM

## 2012-06-12 HISTORY — DX: Ventricular fibrillation: I49.01

## 2012-06-12 HISTORY — DX: Gangrene, not elsewhere classified: I96

## 2012-06-12 LAB — COMPREHENSIVE METABOLIC PANEL
AST: 18 U/L (ref 0–37)
Albumin: 2.9 g/dL — ABNORMAL LOW (ref 3.5–5.2)
BUN: 46 mg/dL — ABNORMAL HIGH (ref 6–23)
CO2: 27 mEq/L (ref 19–32)
Calcium: 9.6 mg/dL (ref 8.4–10.5)
Chloride: 105 mEq/L (ref 96–112)
Creatinine, Ser: 1.32 mg/dL — ABNORMAL HIGH (ref 0.50–1.10)
GFR calc non Af Amer: 49 mL/min — ABNORMAL LOW (ref >90)
Total Bilirubin: 1.1 mg/dL (ref 0.3–1.2)

## 2012-06-12 LAB — CBC
HCT: 44.5 % (ref 36.0–46.0)
MCH: 31.7 pg (ref 26.0–34.0)
MCV: 96.7 fL (ref 78.0–100.0)
Platelets: 278 10*3/uL (ref 150–400)
RDW: 13.1 % (ref 11.5–15.5)
WBC: 11.5 10*3/uL — ABNORMAL HIGH (ref 4.0–10.5)

## 2012-06-12 LAB — DIFFERENTIAL
Basophils Absolute: 0 10*3/uL (ref 0.0–0.1)
Basophils Relative: 0 % (ref 0–1)
Lymphocytes Relative: 15 % (ref 12–46)
Neutro Abs: 8.8 10*3/uL — ABNORMAL HIGH (ref 1.7–7.7)
Neutrophils Relative %: 76 % (ref 43–77)

## 2012-06-12 MED ORDER — LORAZEPAM 1 MG PO TABS
2.0000 mg | ORAL_TABLET | Freq: Every evening | ORAL | Status: DC | PRN
  Administered 2012-06-12 – 2012-06-15 (×3): 2 mg via ORAL
  Filled 2012-06-12: qty 2
  Filled 2012-06-12: qty 1
  Filled 2012-06-12: qty 2
  Filled 2012-06-12: qty 1

## 2012-06-12 MED ORDER — SODIUM CHLORIDE 0.9 % IJ SOLN
3.0000 mL | INTRAMUSCULAR | Status: DC | PRN
  Administered 2012-06-13 – 2012-06-15 (×2): 3 mL via INTRAVENOUS

## 2012-06-12 MED ORDER — LORATADINE 10 MG PO TABS
10.0000 mg | ORAL_TABLET | Freq: Every day | ORAL | Status: DC
  Filled 2012-06-12 (×5): qty 1

## 2012-06-12 MED ORDER — LIDOCAINE 4 % EX CREA
TOPICAL_CREAM | Freq: Once | CUTANEOUS | Status: DC
  Filled 2012-06-12: qty 5

## 2012-06-12 MED ORDER — SODIUM CHLORIDE 0.9 % IV SOLN: 250.0000 mL | INTRAVENOUS | Status: DC | PRN

## 2012-06-12 MED ORDER — SPIRONOLACTONE 12.5 MG HALF TABLET
12.5000 mg | ORAL_TABLET | Freq: Every day | ORAL | Status: DC
  Filled 2012-06-12: qty 1

## 2012-06-12 MED ORDER — FUROSEMIDE 20 MG PO TABS
20.0000 mg | ORAL_TABLET | Freq: Two times a day (BID) | ORAL | Status: DC
  Filled 2012-06-12 (×2): qty 1

## 2012-06-12 MED ORDER — METOPROLOL SUCCINATE ER 25 MG PO TB24
25.0000 mg | ORAL_TABLET | Freq: Every day | ORAL | Status: DC
  Administered 2012-06-14: 25 mg via ORAL
  Filled 2012-06-12 (×2): qty 1

## 2012-06-12 MED ORDER — SODIUM CHLORIDE 0.9 % IJ SOLN
3.0000 mL | Freq: Two times a day (BID) | INTRAMUSCULAR | Status: DC
  Administered 2012-06-13 – 2012-06-16 (×6): 3 mL via INTRAVENOUS

## 2012-06-12 MED ORDER — FLUTICASONE PROPIONATE HFA 44 MCG/ACT IN AERO
1.0000 | INHALATION_SPRAY | Freq: Two times a day (BID) | RESPIRATORY_TRACT | Status: DC
  Administered 2012-06-12 – 2012-06-16 (×8): 1 via RESPIRATORY_TRACT
  Filled 2012-06-12: qty 10.6

## 2012-06-12 MED ORDER — PNEUMOCOCCAL VAC POLYVALENT 25 MCG/0.5ML IJ INJ
0.5000 mL | INJECTION | INTRAMUSCULAR | Status: AC
  Filled 2012-06-12: qty 0.5

## 2012-06-12 MED ORDER — HYDROCODONE-ACETAMINOPHEN 5-325 MG PO TABS
1.0000 | ORAL_TABLET | Freq: Four times a day (QID) | ORAL | Status: DC | PRN
  Administered 2012-06-12 – 2012-06-14 (×4): 1 via ORAL
  Filled 2012-06-12 (×4): qty 1

## 2012-06-12 MED ORDER — FUROSEMIDE 20 MG PO TABS
20.0000 mg | ORAL_TABLET | Freq: Every day | ORAL | Status: DC
  Administered 2012-06-13 – 2012-06-16 (×4): 20 mg via ORAL
  Filled 2012-06-12 (×4): qty 1

## 2012-06-12 MED ORDER — INFLUENZA VIRUS VACC SPLIT PF IM SUSP
0.5000 mL | INTRAMUSCULAR | Status: AC
  Filled 2012-06-12: qty 0.5

## 2012-06-12 NOTE — Telephone Encounter (Signed)
I discussed this pt with Dr Riley Kill (DOD) and he would be happy to evaluate the pt in the office today. I left a message on the pt's voicemail to call back.

## 2012-06-12 NOTE — Progress Notes (Signed)
PM  Labs noted.  No hypokalemia noted.  Appears volume contracted.  Will reduce furosemide and stop aldactone for now.  She will need to see EP on call for weekend. TS

## 2012-06-12 NOTE — Telephone Encounter (Signed)
plz return call to pt [phone]  Pt fainted yesterday  while walking to the mail box, experienced sweats, and disorientation.  plz return call to advise

## 2012-06-12 NOTE — H&P (Signed)
HPI:  The patient is seen as an add-on today. She is complex medical history with a history of HOCM, mitral valve repair, complicated hospital course, UE DVT, subsequent pericardial tamponade while on anticoagulant therapy.  This was recently nicely summarized by Tereso Newcomer as follows:  She has a h/o HOCM. She underwent a septal myomectomy and mitral valve repair at [facility] in 12/2011. Postoperative course was c/b VT, A. Fib and left UE DVT. She had AICD implanted. She was placed on Coumadin and developed necrotic skin reaction that was transitioned over to Pradaxa. 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 She developed a hemorrhagic effusion. Her Pradaxa was discontinued. She eventually progressed to tamponade and ultimately underwent pericardial window with Dr. Cornelius Moras. She had difficulty with hypotension. Echo 03/19/12: Severe asymmetric septal hypertrophy, evidence of upper septal myomectomy, peak LV outflow tract gradient 35, EF 35%, indeterminate diastolic function, no mitral valve SAM, hockey-stick appearance of the mitral valve, moderate MAC, trivial MR, probable mild to moderate mitral stenosis but no mean gradient measured across the mitral valve, moderate LAE, trivial pericardial effusion.  Yesterday she had a couple of drinks prior to 5-7, went out in the yard and got dizzy, room started to spin, and she went out.  She does not remember much but was out for about a minute.  Interrogation today reveals evidence of VF with shocks and two additional episodes of VT not resulting in shocks.  She is on several meds, and is admitted now for observation and likely optimization of both electrolytes and possible add on therapy.     Current Outpatient Prescriptions  Medication Sig Dispense Refill  . beclomethasone (QVAR) 80 MCG/ACT inhaler Inhale 1 puff into the lungs 2 (two) times daily.      . cetirizine (ZYRTEC) 10 MG tablet Take 10 mg by mouth daily.        .  furosemide (LASIX) 20 MG tablet Take 1 tablet (20 mg total) by mouth 2 (two) times daily.  60 tablet  3  . metoprolol (LOPRESSOR) 50 MG tablet Take 25 mg by mouth 1 day or 1 dose. 1/2 TAB DAILY      . ondansetron (ZOFRAN) 4 MG tablet Take 4 mg by mouth as needed. For nausea and vomiting      . spironolactone (ALDACTONE) 25 MG tablet Take 0.5 tablets (12.5 mg total) by mouth daily.      Marland Kitchen HYDROcodone-acetaminophen (LORTAB) 7.5-500 MG per tablet TAKE 1 TABLET BY MOUTH EVERY 4-6 HOURS AS NEEDED FOR PAIN  40 tablet  0    Allergies  Allergen Reactions  . Warfarin And Related     Skin reaction    Past Medical History  Diagnosis Date  . Hypertrophic cardiomyopathy     s/p septal myomectomy with implantable defibrillator 01/27/2012   . Anxiety state, unspecified   . Allergic rhinitis     to dogs  . Asthma   . History of anemia     during pregnancy  . History of chicken pox   . Generalized headaches   . Ocular migraine     no HA  . Back pain     told cracked bone, vicodin didn't help, improved with percocets/muscle relaxants, no eval by prior PCP  . 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 DVT (deep venous thrombosis)     Diagnosed 02/13/12  - was placed on Warfarin after cardiac  surgery but had skin reaction to warfarin so was changed to Pradaxa so DVT occurred while on Pradaxa although unclear if it occurred during transition  . Hypotension   . Atrial fibrillation     a. On pradaxa.  coumadin necrosis with warfarin;  b. Amio d/c'd 02/2012  . ICD (implantable cardiac defibrillator) in place     02/04/2012, [employer]  . Pericardial effusion   . Chronic combined systolic and diastolic CHF (congestive heart failure)     echo 03/19/12: severe asymmetric septal hypertrophy, evidence of upper septal myomectomy, peak LV outflow tract gradient 35, EF 35%, indeterminate diastolic function, no mitral valve SAM, hockey-stick appearance of the  mitral valve, moderate MAC, trivial MR, probable mild to moderate mitral stenosis but no mean gradient measured across the mitral valve, moderate LAE, trivial pericardial effusion.   . Pericardial tamponade 03/14/2012    Past Surgical History  Procedure Date  . Cesarean section 1991  . Induced abortion 1990 and 1993  . US echocardiography 08/2011    severe LVH, severe asymmetric hypertrophy, EF 50-55%, grade 2 diastolic dysfunction  . Cardiac surgery 01/27/2012    median sternotomy, septal myectomy, MVR - [facility] (Dr. Silvestre Mesi)  . Cardiac defibrillator placement 01/2012  . Myomectomy   . Pericardial window 03/14/2012    Procedure: PERICARDIAL WINDOW;  Surgeon: Purcell Nails, MD;  Location: [facility] OR;  Service: Open Heart Surgery;  Laterality: N/A;  Subxyphoid Pericardial  Window     Family History  Problem Relation Age of Onset  . Diabetes Father   . Heart disease Father     unspecified heart problem  . Thyroid disease Mother   . Anemia Mother     mediterranean anemia, ITP  . Heart disease Maternal Grandfather     CHF  . Diabetes Paternal Grandfather   . Cancer Paternal Grandfather   . Coronary artery disease Neg Hx   . Stroke Neg Hx   . Sudden death Neg Hx     History   Social History  . Marital Status: Single    Spouse Name: N/A    Number of Children: 1  . Years of Education: N/A   Occupational History  . Naval architect    Social History Main Topics  . Smoking status: Former Smoker -- 1.0 packs/day for 3 years    Types: Cigarettes    Quit date: 09/30/1988  . Smokeless tobacco: Never Used   Comment: quitin 1990  . Alcohol Use: 0.0 oz/week     rarely drinks wine  . Drug Use: No     quiti n 1989  . Sexually Active: Yes    Birth Control/ Protection: Pill   Other Topics Concern  . Not on file   Social History Narrative   Caffeine: 1 cup coffee/dayDivorced, 1 child.  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  All other systems reviewed and negative.  PHYSICAL EXAM:  BP 110/66  Pulse 82  Ht 5' (1.524 m)  Wt 185 lb 6.4 oz (84.097 kg)  BMI 36.21 kg/m2  General: Well developed, well nourished, in no acute distress. Head:  Normocephalic and atraumatic. Neck: no JVD.  Median sternotomy healing.   Lungs: Clear to auscultation and percussion. Heart: Normal S1 and S2. Scratchy murmur, ? Rub.   Abdomen:  Normal bowel sounds; soft; non tender; no organomegaly Pulses: Pulses normal in all 4 extremities. Extremities: No  clubbing or cyanosis.  Trace edema Neurologic: Alert and oriented x 3.  EKG:  NSR. LBBB.     ASSESSMENT AND PLAN:  1.  VF sp surgery for HOCM, with septal myotomy, post op arrhythmia with defib,  2.  SP pericardial window  Plan  1.  Admit 2.  Repeat echo. 2.  Labs 3.  Observation 4.  EP to see tomorrow.

## 2012-06-12 NOTE — Telephone Encounter (Signed)
The pt called back and appointment scheduled.

## 2012-06-12 NOTE — Telephone Encounter (Signed)
Spoke with patient and she stated she had 2 mixed drinks yesterday with very little alcohol and then walked to the mailbox. Stated she felt dizzy and then woke up to her boyfriend yelling her name.  After waking she felt very sweaty and did vomit x 1.  She did not think this was alcohol related. Patient stated that she has not been sleeping well for the last several nights. Today she has felt a little dizzy.  Blood pressure this am prior to Metoprolol was 106/86 and heart rate 77.  Will forward to Lauren RN and she will discuss with Dr Riley Kill since he is DOD

## 2012-06-13 ENCOUNTER — Encounter (HOSPITAL_COMMUNITY): Payer: Self-pay

## 2012-06-13 DIAGNOSIS — I4901 Ventricular fibrillation: Secondary | ICD-10-CM

## 2012-06-13 DIAGNOSIS — I059 Rheumatic mitral valve disease, unspecified: Secondary | ICD-10-CM

## 2012-06-13 LAB — BASIC METABOLIC PANEL
CO2: 24 mEq/L (ref 19–32)
Calcium: 9.4 mg/dL (ref 8.4–10.5)
Chloride: 102 mEq/L (ref 96–112)
Creatinine, Ser: 0.71 mg/dL (ref 0.50–1.10)
Glucose, Bld: 88 mg/dL (ref 70–99)

## 2012-06-13 MED ORDER — AMIODARONE HCL 200 MG PO TABS
200.0000 mg | ORAL_TABLET | Freq: Three times a day (TID) | ORAL | Status: DC
  Administered 2012-06-13 – 2012-06-14 (×2): 200 mg via ORAL
  Filled 2012-06-13 (×5): qty 1

## 2012-06-13 MED ORDER — AMIODARONE HCL 200 MG PO TABS: 200.0000 mg | ORAL_TABLET | Freq: Three times a day (TID) | ORAL | Status: DC

## 2012-06-13 MED ORDER — ONDANSETRON HCL 4 MG/2ML IJ SOLN
INTRAMUSCULAR | Status: AC
  Filled 2012-06-13: qty 2

## 2012-06-13 MED ORDER — ONDANSETRON HCL 4 MG/2ML IJ SOLN
4.0000 mg | Freq: Four times a day (QID) | INTRAMUSCULAR | Status: DC | PRN
  Administered 2012-06-13 – 2012-06-14 (×4): 4 mg via INTRAVENOUS
  Filled 2012-06-13 (×3): qty 2

## 2012-06-13 MED ORDER — AMIODARONE HCL 200 MG PO TABS
200.0000 mg | ORAL_TABLET | Freq: Three times a day (TID) | ORAL | Status: DC
  Administered 2012-06-13: 200 mg via ORAL
  Filled 2012-06-13 (×3): qty 1

## 2012-06-13 NOTE — Progress Notes (Signed)
Patient Name: [name]      SUBJECTIVE: Admitted yesterday following an episode of EF treated via an ICD.  She has a history of hypertrophic obstructive cardiomyopathy and underwent myomectomy and mitral valve repair 12/31/2011 with postoperative course complicated by VT opportunity DVT and ICD implantation. Her course was further complicated by the development of hemorrhagic effusion ultimately requiring a window. She was previously treated with dabigitran.  Ejection fraction by echo September 2013 45-50% with severe left atrial dilatation with a dimension 58 mm Past Medical History  Diagnosis Date  . Hypertrophic cardiomyopathy     s/p septal myomectomy with implantable defibrillator 01/27/2012   . Anxiety state, unspecified   . Allergic rhinitis     to dogs  . Asthma   . History of anemia     during pregnancy  . History of chicken pox   . Generalized headaches   . Ocular migraine     no HA  . Back pain     told cracked bone, vicodin didn't help, improved with percocets/muscle relaxants, no eval by prior PCP  . 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 DVT (deep venous thrombosis)     Diagnosed 02/13/12  - was placed on Warfarin after cardiac surgery but had skin reaction to warfarin so was changed to Pradaxa so DVT occurred while on Pradaxa although unclear if it occurred during transition  . Hypotension   . Atrial fibrillation     a. On pradaxa.  coumadin necrosis with warfarin;  b. Amio d/c'd 02/2012  . ICD (implantable cardiac defibrillator) in place     02/04/2012, [employer]  . Pericardial effusion   . Chronic combined systolic and diastolic CHF (congestive heart failure)     echo 03/19/12: severe asymmetric septal hypertrophy, evidence of upper septal myomectomy, peak LV outflow tract gradient 35, EF 35%, indeterminate diastolic function, no mitral valve SAM, hockey-stick appearance of the mitral valve, moderate  MAC, trivial MR, probable mild to moderate mitral stenosis but no mean gradient measured across the mitral valve, moderate LAE, trivial pericardial effusion.   . Pericardial tamponade 03/14/2012  . Ventricular fibrillation 06/13/2012    PHYSICAL EXAM Filed Vitals:   06/12/12 2031 06/12/12 2300 06/13/12 0456 06/13/12 0803  BP:   99/55   Pulse:   81   Temp:   98.2 F (36.8 C)   TempSrc:   Oral   Resp:   18   Height:  5' (1.524 m)    Weight:  185 lb 6.5 oz (84.1 kg)    SpO2: 98%  96% 98%   Well developed and nourished in no acute distress HENT normal Neck supple with JVP-flat Carotids brisk and full without bruits Clear Regular rate and rhythm, 2/6 systolic murmur at the left upper sternal border  Well-healed sternotomy  Abd-soft with active BS without hepatomegaly No Clubbing cyanosis edema Skin-warm and dry A & Oriented  Grossly normal sensory and motor function   TELEMETRY: Reviewed telemetry pt in sinus rhythm:   No intake or output data in the 24 hours ending 06/13/12 1007  LABS: Basic Metabolic Panel:  Lab 06/13/12 4540 06/12/12 2016  NA 137 143  K 4.0 4.3  CL 102 105  CO2 24 27  GLUCOSE 88 209*  BUN 17 46*  CREATININE 0.71 1.32*  CALCIUM 9.4 9.6  MG -- 2.7*  PHOS -- --   Cardiac Enzymes: No results found for this basename:  CKTOTAL:3,CKMB:3,CKMBINDEX:3,TROPONINI:3 in the last 72 hours CBC:  Lab 06/12/12 2016  WBC 11.5*  NEUTROABS 8.8*  HGB 14.6  HCT 44.5  MCV 96.7  PLT 278   PROTIME: No results found for this basename: LABPROT:3,INR:3 in the last 72 hours Liver Function Tests:  [facility] 06/12/12 2016  AST 18  ALT 22  ALKPHOS 93  BILITOT 1.1  PROT 7.1  ALBUMIN 2.9*   No results found for this basename: LIPASE:2,AMYLASE:2 in the last 72 hours BNP: BNP (last 3 results)  Basename 03/13/12 1911 03/09/12 0829 03/02/12 0945  PROBNP 3938.0* 2584.0* 2629.0*     Device Interrogation: Reviewed demonstrated polymorphic ventricular tachycardia  requiring shock therapy. She also 2 episodes of nonsustained ventricular tachycardia as well as an intercurrent episode of atrial fibrillation.   ASSESSMENT AND PLAN:  Patient Active Hospital Problem List: Ventricular fibrillation (06/13/2012)   Hypertrophic cardiomyopathy/modest left ventricular dysfunction-EF 45-50% (08/30/2011)   Atrial fibrillation (02/18/2012)   Chronic combined systolic and diastolic CHF (congestive heart failure) ()   ICD -[employer] (06/12/2012)  The patient had ventricular fibrillation requiring shock therapy. There were 2 other episodes of nonsustained polymorphic ventricular tachycardia; none of these was associated with long short coupling. She also is atrial fibrillation. We'll begin her on amiodarone. I have reviewed with her side effects. She apparently has some nausea in the context of amiodarone and multiple other drugs while in. We will have to see how she does.  I have further advised her that she   is not allowed to drive. Obviously that is consternating  Her BNP is elevated; she appears euvolemic. We'll continue her diuretic. Signed, Sherryl Manges MD  06/13/2012

## 2012-06-13 NOTE — Progress Notes (Signed)
Case discussed with Dr. Graciela Husbands who will see the patient today to help with recommendations from an EP perspective.

## 2012-06-13 NOTE — Progress Notes (Signed)
  Echocardiogram 2D Echocardiogram has been performed.  [name] 06/13/2012, 8:57 AM

## 2012-06-14 ENCOUNTER — Encounter (HOSPITAL_COMMUNITY): Payer: Self-pay | Admitting: Internal Medicine

## 2012-06-14 LAB — BASIC METABOLIC PANEL
CO2: 28 mEq/L (ref 19–32)
Chloride: 101 mEq/L (ref 96–112)
Creatinine, Ser: 0.71 mg/dL (ref 0.50–1.10)
GFR calc Af Amer: >90 mL/min (ref >90)
Sodium: 138 mEq/L (ref 135–145)

## 2012-06-14 LAB — HEMOGLOBIN A1C: Hgb A1c MFr Bld: 5.5 % (ref <5.7)

## 2012-06-14 LAB — MAGNESIUM: Magnesium: 2 mg/dL (ref 1.5–2.5)

## 2012-06-14 MED ORDER — SODIUM CHLORIDE 0.9 % IV SOLN: 250.0000 mL | INTRAVENOUS | Status: DC | PRN

## 2012-06-14 MED ORDER — ASPIRIN EC 325 MG PO TBEC
325.0000 mg | DELAYED_RELEASE_TABLET | Freq: Every day | ORAL | Status: DC
  Administered 2012-06-14 – 2012-06-16 (×3): 325 mg via ORAL
  Filled 2012-06-14 (×3): qty 1

## 2012-06-14 MED ORDER — SODIUM CHLORIDE 0.9 % IJ SOLN
3.0000 mL | Freq: Two times a day (BID) | INTRAMUSCULAR | Status: DC
  Administered 2012-06-16: 3 mL via INTRAVENOUS

## 2012-06-14 MED ORDER — PROMETHAZINE HCL 25 MG/ML IJ SOLN
12.5000 mg | Freq: Four times a day (QID) | INTRAMUSCULAR | Status: DC | PRN
  Administered 2012-06-14: 12.5 mg via INTRAVENOUS
  Filled 2012-06-14: qty 1

## 2012-06-14 MED ORDER — SODIUM CHLORIDE 0.9 % IJ SOLN: 3.0000 mL | INTRAMUSCULAR | Status: DC | PRN

## 2012-06-14 MED ORDER — METOPROLOL SUCCINATE ER 25 MG PO TB24
25.0000 mg | ORAL_TABLET | Freq: Every day | ORAL | Status: DC
  Administered 2012-06-15 – 2012-06-16 (×2): 25 mg via ORAL
  Filled 2012-06-14 (×2): qty 1

## 2012-06-14 MED ORDER — DIPHENHYDRAMINE HCL 25 MG PO CAPS
25.0000 mg | ORAL_CAPSULE | Freq: Four times a day (QID) | ORAL | Status: DC | PRN
  Administered 2012-06-14: 25 mg via ORAL
  Filled 2012-06-14: qty 1

## 2012-06-14 MED ORDER — OFF THE BEAT BOOK
Freq: Once | Status: AC
  Administered 2012-06-14: 20:00:00
  Filled 2012-06-14: qty 1

## 2012-06-14 MED ORDER — DOFETILIDE 500 MCG PO CAPS
500.0000 ug | ORAL_CAPSULE | Freq: Two times a day (BID) | ORAL | Status: DC
  Administered 2012-06-14 – 2012-06-16 (×5): 500 ug via ORAL
  Filled 2012-06-14 (×6): qty 1

## 2012-06-14 NOTE — Progress Notes (Signed)
Pt had an episode of vomiting. States after amiodarone and zofran were given together this AM, she felt fine.  But as time passed, she felt progressively queasy, then nauseated, progressing to vomiting episode.  Reports she feels better after vomiting.  Offered saltine crackers and ginger ale; pt declined.  Will continue to monitor.

## 2012-06-14 NOTE — Plan of Care (Signed)
Problem: Consults Goal: Ventricular Arrhythmia Patient Education (See Patient Education module for education specifics.) Outcome: Completed/Met Date Met:  06/14/12 MD education plus RN education re: ventricular arrhythmias  Problem: Phase I Progression Outcomes Goal: Arrhythmia controlled or corrected Outcome: Progressing Unable to tolerate amiodarone (nausea/vomiting). Placed on Tikosyn--first dose today.

## 2012-06-14 NOTE — Progress Notes (Signed)
Notified by telemetry clerk that patient had 10 beats of VTach @2am . Entered to check on patient and she was okay and had been sleeping. Strip placed in chart. Will continue to monitor.

## 2012-06-14 NOTE — Plan of Care (Signed)
Problem: Phase III Progression Outcomes Goal: Activity at appropriate level-compared to baseline (UP IN CHAIR FOR HEMODIALYSIS)  Outcome: Progressing Sitting on side of bed for breakfast Goal: Voiding independently Outcome: Progressing Requires supervision for ambulation r/t recent syncopal episode Goal: IV/normal saline lock discontinued Outcome: Progressing NSL

## 2012-06-14 NOTE — Progress Notes (Signed)
Patient Name: [name]      SUBJECTIVE: Admitted Friday following an episode of EF treated via an ICD.  She has a history of hypertrophic obstructive cardiomyopathy and underwent myomectomy and mitral valve repair 12/31/2011 with postoperative course complicated by VT opportunity DVT and ICD implantation. Her course was further complicated by the development of hemorrhagic effusion ultimately requiring a window. She was previously treated with dabigitran.  Also previous treatment at [facility] with amiodarone and multiple meds was cx by nausea  Ejection fraction by echo September 2013 45-50% with severe left atrial dilatation with a dimension 58 mm  She has not tolerated Amio overnight 2/2 nausea  BP has limited betablocker Past Medical History  Diagnosis Date  . Hypertrophic cardiomyopathy     s/p septal myomectomy with implantable defibrillator 01/27/2012   . Anxiety state, unspecified   . Allergic rhinitis     to dogs  . Asthma   . History of anemia     during pregnancy  . History of chicken pox   . Generalized headaches   . Ocular migraine     no HA  . Back pain     told cracked bone, vicodin didn't help, improved with percocets/muscle relaxants, no eval by prior PCP  . 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 DVT (deep venous thrombosis)     Diagnosed 02/13/12  - was placed on Warfarin after cardiac surgery but had skin reaction to warfarin so was changed to Pradaxa so DVT occurred while on Pradaxa although unclear if it occurred during transition  . Hypotension   . Atrial fibrillation     a. On pradaxa.  coumadin necrosis with warfarin;  b. Amio d/c'd 02/2012  . ICD (implantable cardiac defibrillator) in place     02/04/2012, [employer]  . Pericardial effusion   . Chronic combined systolic and diastolic CHF (congestive heart failure)     echo 03/19/12: severe asymmetric septal hypertrophy, evidence of upper septal  myomectomy, peak LV outflow tract gradient 35, EF 35%, indeterminate diastolic function, no mitral valve SAM, hockey-stick appearance of the mitral valve, moderate MAC, trivial MR, probable mild to moderate mitral stenosis but no mean gradient measured across the mitral valve, moderate LAE, trivial pericardial effusion.   . Pericardial tamponade 03/14/2012  . Ventricular fibrillation 06/13/2012    PHYSICAL EXAM Filed Vitals:   06/13/12 2032 06/13/12 2049 06/14/12 0452 06/14/12 0757  BP:  102/64 98/64   Pulse:  78 79   Temp:   98.2 F (36.8 C)   TempSrc:   Oral   Resp:  18 18   Height:      Weight:      SpO2: 8% 98% 94% 98%   Well developed and nourished in no acute distress HENT normal Neck supple with JVP-flat Carotids brisk and full without bruits Clear Regular rate and rhythm, 2/6 systolic murmur at the left upper sternal border  Well-healed sternotomy  Abd-soft with active BS without hepatomegaly No Clubbing cyanosis edema Skin-warm and dry A & Oriented  Grossly normal sensory and motor function   TELEMETRY: Reviewed telemetry pt in sinus rhythm:    Intake/Output Summary (Last 24 hours) at 06/14/12 1016 Last data filed at 06/13/12 1300  Gross per 24 hour  Intake      0 ml  Output      1 ml  Net     -1 ml    LABS: Basic  Metabolic Panel:  Lab 06/13/12 1610 06/12/12 2016  NA 137 143  K 4.0 4.3  CL 102 105  CO2 24 27  GLUCOSE 88 209*  BUN 17 46*  CREATININE 0.71 1.32*  CALCIUM 9.4 9.6  MG -- 2.7*  PHOS -- --   Cardiac Enzymes: No results found for this basename: CKTOTAL:3,CKMB:3,CKMBINDEX:3,TROPONINI:3 in the last 72 hours CBC:  Lab 06/12/12 2016  WBC 11.5*  NEUTROABS 8.8*  HGB 14.6  HCT 44.5  MCV 96.7  PLT 278   PROTIME: No results found for this basename: LABPROT:3,INR:3 in the last 72 hours Liver Function Tests:  [facility] 06/12/12 2016  AST 18  ALT 22  ALKPHOS 93  BILITOT 1.1  PROT 7.1  ALBUMIN 2.9*   No results found for this  basename: LIPASE:2,AMYLASE:2 in the last 72 hours BNP: BNP (last 3 results)  Basename 03/13/12 1911 03/09/12 0829 03/02/12 0945  PROBNP 3938.0* 2584.0* 2629.0*     Device Interrogation: Reviewed demonstrated polymorphic ventricular tachycardia requiring shock therapy. She also 2 episodes of nonsustained ventricular tachycardia as well as an intercurrent episode of atrial fibrillation.   ASSESSMENT AND PLAN:  Patient Active Hospital Problem List: Ventricular fibrillation (06/13/2012)   Hypertrophic cardiomyopathy/modest left ventricular dysfunction-EF 45-50% (08/30/2011)   Atrial fibrillation (02/18/2012)   Chronic combined systolic and diastolic CHF (congestive heart failure) ()   ICD -[employer] (06/12/2012)  The patient had ventricular fibrillation requiring shock therapy. There were 2 other episodes of nonsustained polymorphic ventricular tachycardia; none of these was associated with long short coupling. She also is atrial fibrillation.    I have further advised her that she   is not allowed to drive. Obviously that is consternating   Her BNP is elevated; she appears euvolemic. We'll continue her diuretic. Will have to stop amiodarone Antiarrhythmic choices are limited; cannot take Ic agents;  rechccking ECG as QTc is also long;  With an ICD we may to take the TdP risk of tikosyn.  Her atrial dimension make recurrent af a high likely event,and tikosyn maybe more effective than disopyramide Need to reassess risk benefit of anticoagulation     ecg QT 520  with QRS duration of 180 or so; with ICD implant and mild LV dysfunction will choose tikosyn Will recheck K and mag this am IOnitial Blood sugar was very high  Hgb A1C was 5.6 in June,  Will recheck but i wonder if wrong labs Signed,  Sherryl Manges MD  06/14/2012

## 2012-06-14 NOTE — Progress Notes (Signed)
The patient was admitted to the hospital after being seen as an add-on in the clinic. She will be charged that an admission charge.

## 2012-06-14 NOTE — Progress Notes (Signed)
Performed Tikosyn education with patient. Gave medication handout and answered all questions within my scope of practice. Gave first dose of Tikosyn after labs resulted. Potassium 4.0, Magnesium 2.0. QTc by EKG prior to administration (patient has ICD). Will obtain another EKG to measure QTc at 1620. Informed monitor tech of Tikosyn initiation. Will continue to monitor. [name]

## 2012-06-14 NOTE — Plan of Care (Signed)
Problem: Phase III Progression Outcomes Goal: Voiding independently Outcome: Progressing Supervision required due to recent falls Goal: IV/normal saline lock discontinued Outcome: Progressing NSL

## 2012-06-14 NOTE — Progress Notes (Signed)
Performed EKG 3 hours post-Tikosyn, first dose. QRS remains (as prior, MD was aware), QTc (prior was ). Noticed no BMET/Mg ordered for AM, called PA, who ordered. Patient tolerating Tikosyn thus far and will get 2nd dose tonight. Will continue to monitor. Harlow Asa

## 2012-06-15 LAB — BASIC METABOLIC PANEL
BUN: 14 mg/dL (ref 6–23)
Calcium: 9.3 mg/dL (ref 8.4–10.5)
GFR calc Af Amer: >90 mL/min (ref >90)
GFR calc non Af Amer: >90 mL/min (ref >90)
Glucose, Bld: 95 mg/dL (ref 70–99)
Sodium: 137 mEq/L (ref 135–145)

## 2012-06-15 MED ORDER — INFLUENZA VIRUS VACC SPLIT PF IM SUSP
0.5000 mL | Freq: Once | INTRAMUSCULAR | Status: AC
  Administered 2012-06-15: 0.5 mL via INTRAMUSCULAR
  Filled 2012-06-15: qty 0.5

## 2012-06-15 MED ORDER — PNEUMOCOCCAL VAC POLYVALENT 25 MCG/0.5ML IJ INJ
0.5000 mL | INJECTION | Freq: Once | INTRAMUSCULAR | Status: AC
  Administered 2012-06-15: 0.5 mL via INTRAMUSCULAR
  Filled 2012-06-15: qty 0.5

## 2012-06-15 MED ORDER — POTASSIUM CHLORIDE CRYS ER 20 MEQ PO TBCR: 20.0000 meq | EXTENDED_RELEASE_TABLET | Freq: Every day | ORAL | Status: DC

## 2012-06-15 MED ORDER — POTASSIUM CHLORIDE CRYS ER 20 MEQ PO TBCR
20.0000 meq | EXTENDED_RELEASE_TABLET | Freq: Two times a day (BID) | ORAL | Status: AC
  Administered 2012-06-15 – 2012-06-16 (×3): 20 meq via ORAL
  Filled 2012-06-15 (×3): qty 1

## 2012-06-15 NOTE — Care Management Note (Unsigned)
    Page 1 of 1   06/15/2012     10:41:45 AM   CARE MANAGEMENT NOTE 06/15/2012  Patient:  [name],[name]   Account Number:  [id number]  Date Initiated:  06/15/2012  Documentation initiated by:  SIMMONS,Olia Hinderliter  Subjective/Objective Assessment:   ADMITTED WITH ICD SHOCK;  LIVES AT HOME ALONE; HAS BF.     Action/Plan:   DISCHARGE PLANNING INITIATED.   Anticipated DC Date:  06/16/2012   Anticipated DC Plan:  HOME/SELF CARE      DC Planning Services  CM consult      Choice offered to / List presented to:             Status of service:  In process, will continue to follow Medicare Important Message given?   (If response is "NO", the following Medicare IM given date fields will be blank) Date Medicare IM given:   Date Additional Medicare IM given:    Discharge Disposition:    Per UR Regulation:  Reviewed for med. necessity/level of care/duration of stay  If discussed at Long Length of Stay Meetings, dates discussed:    Comments:  06/15/12  1039  Kristine Tiley SIMMONS RN, BSN [phone] NP- PLEASE WRITE SEPARATE RX FOR 7 DAY SUPPLY OF TIKOSYN TO BE SENT TO MAIN PHARMACY AT D/C.  HER PHARMACY DISPENSES THE MED BUT THEY WILL HAVE TO ORDER;  NCM WILL FOLLOW.

## 2012-06-15 NOTE — Progress Notes (Signed)
Dr.Klein notified of EKG  QTc 558 .Egbert Garibaldi A

## 2012-06-15 NOTE — Progress Notes (Signed)
Patient was dry heaving around 1945 due to nausea from amiodarone; it was not time for her next prn IV zofran dose. PA on call notified and orders received to administer IV phenergan. Patient tolerated phenergan well and nausea was relieved. Patient also started itching and expressed that she was restless. PA on call notified and orders received to administer po benedryl. Patient stated that this was effective. She was also given her scheduled po ativan. Will continue to monitor.

## 2012-06-15 NOTE — Progress Notes (Signed)
Patient had some wide complex beats @129am . Strip placed in chart. Second dose of Tikosyn administered. EKG calculated QTc interval at  559. EKG placed in chart. Will continue to monitor.

## 2012-06-15 NOTE — Progress Notes (Signed)
Noted 12 lead EKG at 0240 QTc 559 confirmed with Dr.Klein ok to give due dose of tikosyn. Egbert Garibaldi A

## 2012-06-15 NOTE — Progress Notes (Signed)
Patient Name: [name]      SUBJECTIVE: Admitted Friday following an episode of EF treated via an ICD.  She has a history of hypertrophic obstructive cardiomyopathy and underwent myomectomy and mitral valve repair 12/31/2011 with postoperative course complicated by VT opportunity DVT and ICD implantation. Her course was further complicated by the development of hemorrhagic effusion ultimately requiring a window. She was previously treated with dabigitran.  Also previous treatment at [facility] with amiodarone and multiple meds was cx by nausea; amio stopped and tikosyn started  Ejection fraction by echo September 2013 45-50% with severe left atrial dilatation with a dimension 58 mmd  BP has limited betablocker  Nausea better off amio Past Medical History  Diagnosis Date  . Hypertrophic cardiomyopathy     s/p septal myomectomy with implantable defibrillator 01/27/2012   . Anxiety state, unspecified   . Allergic rhinitis     to dogs  . Asthma   . History of anemia     during pregnancy  . History of chicken pox   . Generalized headaches   . Ocular migraine     no HA  . Back pain     told cracked bone, vicodin didn't help, improved with percocets/muscle relaxants, no eval by prior PCP  . 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 DVT (deep venous thrombosis)     Diagnosed 02/13/12  - was placed on Warfarin after cardiac surgery but had skin reaction to warfarin so was changed to Pradaxa so DVT occurred while on Pradaxa although unclear if it occurred during transition  . Hypotension   . Atrial fibrillation     a. On pradaxa.  coumadin necrosis with warfarin;  b. Amio d/c'd 02/2012  . ICD (implantable cardiac defibrillator) in place     02/04/2012, [employer]  . Pericardial effusion   . Chronic combined systolic and diastolic CHF (congestive heart failure)     echo 03/19/12: severe asymmetric septal hypertrophy, evidence of upper  septal myomectomy, peak LV outflow tract gradient 35, EF 35%, indeterminate diastolic function, no mitral valve SAM, hockey-stick appearance of the mitral valve, moderate MAC, trivial MR, probable mild to moderate mitral stenosis but no mean gradient measured across the mitral valve, moderate LAE, trivial pericardial effusion.   . Pericardial tamponade 03/14/2012  . Ventricular fibrillation 06/13/2012  . Skin necrosis --COUMADIN     PHYSICAL EXAM Filed Vitals:   06/14/12 1037 06/14/12 1341 06/14/12 1941 06/15/12 0443  BP: 110/73 106/71 94/62 117/75  Pulse: 73 74 67 61  Temp:  98.7 F (37.1 C) 98.7 F (37.1 C) 98.3 F (36.8 C)  TempSrc:  Oral Oral Oral  Resp:  16 18 16   Height:      Weight:      SpO2:  96% 99% 94%   Well developed and nourished in no acute distress HENT normal Neck supple with JVP-flat Carotids brisk and full without bruits Clear Regular rate and rhythm, 2/6 systolic murmur at the left upper sternal border  Well-healed sternotomy  Abd-soft with active BS without hepatomegaly No Clubbing cyanosis edema Skin-warm and dry A & Oriented  Grossly normal sensory and motor function   TELEMETRY: Reviewed telemetry pt in sinus rhythm: occ short VT NS  ECG this am with QTc <550   Intake/Output Summary (Last 24 hours) at 06/15/12 0751 Last data filed at 06/14/12 1700  Gross per 24 hour  Intake    480 ml  Output      0 ml  Net    480 ml    LABS: Basic Metabolic Panel:  Lab 06/15/12 1610 06/14/12 1148 06/13/12 0608 06/12/12 2016  NA 137 138 137 143  K 3.8 4.0 4.0 4.3  CL 100 101 102 105  CO2 27 28 24 27   GLUCOSE 95 95 88 209*  BUN 14 13 17  46*  CREATININE 0.75 0.71 0.71 1.32*  CALCIUM 9.3 9.6 -- --  MG 2.0 2.0 -- --  PHOS -- -- -- --   Cardiac Enzymes: No results found for this basename: CKTOTAL:3,CKMB:3,CKMBINDEX:3,TROPONINI:3 in the last 72 hours CBC:  Lab 06/12/12 2016  WBC 11.5*  NEUTROABS 8.8*  HGB 14.6  HCT 44.5  MCV 96.7  PLT 278    PROTIME: No results found for this basename: LABPROT:3,INR:3 in the last 72 hours Liver Function Tests:  [facility] 06/12/12 2016  AST 18  ALT 22  ALKPHOS 93  BILITOT 1.1  PROT 7.1  ALBUMIN 2.9*   No results found for this basename: LIPASE:2,AMYLASE:2 in the last 72 hours BNP: BNP (last 3 results)  Basename 03/13/12 1911 03/09/12 0829 03/02/12 0945  PROBNP 3938.0* 2584.0* 2629.0*     Device Interrogation: Reviewed demonstrated polymorphic ventricular tachycardia requiring shock therapy. She also 2 episodes of nonsustained ventricular tachycardia as well as an intercurrent episode of atrial fibrillation.   ASSESSMENT AND PLAN:  Patient Active Hospital Problem List: Ventricular fibrillation (06/13/2012)   Hypertrophic cardiomyopathy/modest left ventricular dysfunction-EF 45-50% (08/30/2011)   Atrial fibrillation (02/18/2012)   Chronic combined systolic and diastolic CHF (congestive heart failure) ()   ICD -[employer] (06/12/2012)  The patient had ventricular fibrillation requiring shock therapy. There were 2 other episodes of nonsustained polymorphic ventricular tachycardia; none of these was associated with long short coupling. She also is atrial fibrillation.    I have further advised her that she   is not allowed to drive. Obviously that is consternating   Her BNP is elevated; she appears euvolemic. We'll continue her diuretic.  HgbA1C 5.5   Need to reassess risk benefit of anticoagulation     K a little low this am, will replete Anticipate discharge tues pm if tolerating Rosalva Ferron MD  06/15/2012

## 2012-06-16 LAB — BASIC METABOLIC PANEL
Chloride: 104 mEq/L (ref 96–112)
Creatinine, Ser: 0.75 mg/dL (ref 0.50–1.10)
GFR calc Af Amer: >90 mL/min (ref >90)
Potassium: 4.4 mEq/L (ref 3.5–5.1)
Sodium: 139 mEq/L (ref 135–145)

## 2012-06-16 LAB — MAGNESIUM: Magnesium: 2 mg/dL (ref 1.5–2.5)

## 2012-06-16 MED ORDER — DOFETILIDE 500 MCG PO CAPS: 500.0000 ug | ORAL_CAPSULE | Freq: Two times a day (BID) | ORAL | Status: DC

## 2012-06-16 MED ORDER — METOPROLOL SUCCINATE ER 25 MG PO TB24: 25.0000 mg | ORAL_TABLET | Freq: Every day | ORAL | Status: DC

## 2012-06-16 MED ORDER — HYDROCODONE-ACETAMINOPHEN 7.5-500 MG PO TABS: 1.0000 | ORAL_TABLET | Freq: Four times a day (QID) | ORAL | Status: DC | PRN

## 2012-06-16 MED ORDER — FUROSEMIDE 20 MG PO TABS: 20.0000 mg | ORAL_TABLET | Freq: Every day | ORAL | Status: DC

## 2012-06-16 NOTE — Progress Notes (Signed)
Patient ID: [name], female   DOB: July 25, 1971, 41 y.o.   MRN: [id number] Subjective:   No chest pain or shortness of breath. No palpitations. Objective:  Vital Signs in the last 24 hours: Temp:  [97.6 F (36.4 C)-98.6 F (37 C)] 97.8 F (36.6 C) (09/17 1357) Pulse Rate:  [61-85] 84  (09/17 1357) Resp:  [16-20] 18  (09/17 1357) BP: (89-100)/(52-69) 100/69 mmHg (09/17 1357) SpO2:  [96 %-97 %] 96 % (09/17 1357) Weight:  [186 lb 14.4 oz (84.777 kg)] 186 lb 14.4 oz (84.777 kg) (09/17 0800)  Intake/Output from previous day: 09/16 0701 - 09/17 0700 In: 466 [P.O.:460; I.V.:6] Out: -  Intake/Output from this shift: Total I/O In: 720 [P.O.:720] Out: -   Physical Exam: Well appearing NAD HEENT: Unremarkable Neck:  No JVD, no thyromegally Lungs:  Clear with no wheezes, rales, or rhonchi. HEART:  Regular rate rhythm, grade 1/6 systolic murmur, no rubs, no clicks Abd:  soft, positive bowel sounds, no organomegally, no rebound, no guarding Ext:  2 plus pulses, no edema, no cyanosis, no clubbing Skin:  No rashes no nodules Neuro:  CN II through XII intact, motor grossly intact  Lab Results: No results found for this basename: WBC:2,HGB:2,PLT:2 in the last 72 hours  Basename 06/16/12 0450 06/15/12 0545  NA 139 137  K 4.4 3.8  CL 104 100  CO2 28 27  GLUCOSE 87 95  BUN 16 14  CREATININE 0.75 0.75   No results found for this basename: TROPONINI:2,CK,MB:2 in the last 72 hours Hepatic Function Panel No results found for this basename: PROT,ALBUMIN,AST,ALT,ALKPHOS,BILITOT,BILIDIR,IBILI in the last 72 hours No results found for this basename: CHOL in the last 72 hours No results found for this basename: PROTIME in the last 72 hours  Imaging: No results found.  Cardiac Studies: Telemetry - normal sinus rhythm with left bundle branch block Assessment/Plan:  1. ventricular tachycardia/fibrillation 2. Hypertrophic cardiomyopathy status post myectomy 3. status post ICD shock  secondary to #1 Recommendation: She remains stable on dofetilide. She will continue this medication at the current dose. Her QT interval is slightly elevated but within acceptable limits in the setting of left bundle branch block and prior ICD implantation. She is stable for discharge. She has followup already scheduled with me in several weeks.  LOS: 4 days    Lewayne Bunting M.D. 06/16/2012, 2:29 PM

## 2012-06-16 NOTE — Progress Notes (Signed)
Patient Name: [name] Date of Encounter: 06/16/2012  Principal Problem:  *Ventricular fibrillation Active Problems:  Hypertrophic cardiomyopathy/modest left ventricular dysfunction-EF 45-50%  Atrial fibrillation  Chronic combined systolic and diastolic CHF (congestive heart failure)  ICD -[employer]    SUBJECTIVE: No palps, no presyncope, no chest pain or SOB.   OBJECTIVE Filed Vitals:   06/15/12 1330 06/15/12 2014 06/15/12 2110 06/16/12 0613  BP: 107/55 93/53  98/61  Pulse: 62 61  66  Temp: 98.1 F (36.7 C) 98.6 F (37 C)  97.9 F (36.6 C)  TempSrc: Oral Oral  Oral  Resp: 18 20  16   Height:      Weight:      SpO2: 99% 97% 97% 97%    Intake/Output Summary (Last 24 hours) at 06/16/12 2956 Last data filed at 06/15/12 2102  Gross per 24 hour  Intake    466 ml  Output      0 ml  Net    466 ml   Filed Weights   06/12/12 2300  Weight: 185 lb 6.5 oz (84.1 kg)     PHYSICAL EXAM General: Well developed, well nourished, in no acute distress. Head: Normocephalic, atraumatic.  Neck: Supple without bruits, JVD not elevated. Lungs:  Resp regular and unlabored, basilar rales, no crackles Heart: RRR, S1, S2, no S3, S4, + murmur. Abdomen: Soft, non-tender, non-distended, BS + x 4.  Extremities: No clubbing, cyanosis, no edema.  Neuro: Alert and oriented X 3. Moves all extremities spontaneously. Psych: Normal affect.  LABS: Basic Metabolic Panel: Basename 06/16/12 0450 06/15/12 0545  NA 139 137  K 4.4 3.8  CL 104 100  CO2 28 27  GLUCOSE 87 95  BUN 16 14  CREATININE 0.75 0.75  CALCIUM 9.5 9.3  MG 2.0 2.0  PHOS -- --    Hemoglobin A1C: Basename 06/14/12 1148  HGBA1C 5.5   TELE:   SR     ECG: 15-Jun-2012 14:40:13 [facility] ROUTINE RECORD Sinus bradycardia Possible Left atrial enlargement Left axis deviation Left bundle branch block Abnormal ECG 66mm/s 34mm/mV 100Hz  8.0.1 12SL 241 HD CID: 1 Referred by: Shawnie Pons  Confirmed By: [place] HILTY, MD Vent. rate 59 BPM PR interval 168 ms QRS duration 176 ms QT/QTc 564/558 ms P-R-T axes 55 -36 130  Radiology/Studies: Dg Chest Port 1 View  06/12/2012  *RADIOLOGY REPORT*  Clinical Data: Ventricular fibrillation.  PORTABLE CHEST - 1 VIEW 06/12/2012 1859 hours:  Comparison: Two-view chest x-ray 04/10/2012, 03/19/2012, 08/14/2011.  Findings: Prior sternotomy.  Cardiac silhouette markedly enlarged but stable.  Pulmonary vascularity normal without evidence of pulmonary edema.  Volume loss in the left lower lobe with elevation of the left hemidiaphragm.  Lungs otherwise clear.  Left subclavian pacing defibrillator unchanged.  IMPRESSION: Stable marked cardiomegaly without pulmonary edema.  Left lower lobe atelectasis.   Original Report Authenticated By: Arnell Sieving, M.D.     Current Medications:     . aspirin EC  325 mg Oral Daily  . dofetilide  500 mcg Oral Q12H  . fluticasone  1 puff Inhalation BID  . furosemide  20 mg Oral Daily  . influenza  inactive virus vaccine  0.5 mL Intramuscular Once  . lidocaine   Topical Once  . loratadine  10 mg Oral Daily  . LORazepam  2 mg Oral QHS,MR X 1  . metoprolol succinate  25 mg Oral Daily  . pneumococcal 23 valent vaccine  0.5 mL Intramuscular Once  . potassium chloride  20 mEq Oral BID   Followed by  . potassium chloride  20 mEq Oral Daily  . sodium chloride  3 mL Intravenous Q12H  . sodium chloride  3 mL Intravenous Q12H      ASSESSMENT AND PLAN: Principal Problem:  *Ventricular fibrillation Active Problems:  Hypertrophic cardiomyopathy/modest left ventricular dysfunction-EF 45-50%  Atrial fibrillation  Chronic combined systolic and diastolic CHF (congestive heart failure)  ICD -[employer] s/p initiation of Tikosyn. ECG with prolonged QTc but has LBBB. Currently on 500 mcg Q 12 H. Pt understands importance of holding to q 12 hour dosing.  ASA is only anticoagulant at this time.  Med changes:  Lasix 20 mg BID -> DAILY; Spironolactone d/c'd; Lopressor 25 qd changed to Toprol XL 25 qd; KDur 20 meq daily added.  Melida Quitter , PA-C 9:07 AM 06/16/2012

## 2012-06-16 NOTE — Discharge Summary (Signed)
CARDIOLOGY DISCHARGE SUMMARY   Patient ID: [name] MRN: [id number] DOB/AGE: 11-07-1970 41 y.o.  Admit date: 06/12/2012 Discharge date: 06/16/2012  Primary Discharge Diagnosis:  OOH VF episode, Rx by ICD, VT also seen on interrogation, Tikosyn initiated Secondary Discharge Diagnosis:  Past Medical History  Diagnosis Date  . Hypertrophic cardiomyopathy     s/p septal myomectomy with implantable defibrillator 01/27/2012   . Anxiety state, unspecified   . Allergic rhinitis     to dogs  . Asthma   . History of anemia     during pregnancy  . History of chicken pox   . Generalized headaches   . Ocular migraine     no HA  . Back pain     told cracked bone, vicodin didn't help, improved with percocets/muscle relaxants, no eval by prior PCP  . 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 DVT (deep venous thrombosis)     Diagnosed 02/13/12  - was placed on Warfarin after cardiac surgery but had skin reaction to warfarin so was changed to Pradaxa so DVT occurred while on Pradaxa although unclear if it occurred during transition  . Hypotension   . Atrial fibrillation     a. On pradaxa.  coumadin necrosis with warfarin;  b. Amio d/c'd 02/2012  . ICD (implantable cardiac defibrillator) in place     02/04/2012, [employer]  . Pericardial effusion   . Chronic combined systolic and diastolic CHF (congestive heart failure)     echo 03/19/12: severe asymmetric septal hypertrophy, evidence of upper septal myomectomy, peak LV outflow tract gradient 35, EF 35%, indeterminate diastolic function, no mitral valve SAM, hockey-stick appearance of the mitral valve, moderate MAC, trivial MR, probable mild to moderate mitral stenosis but no mean gradient measured across the mitral valve, moderate LAE, trivial pericardial effusion.   . Pericardial tamponade 03/14/2012  . Ventricular fibrillation 06/13/2012  . Skin necrosis --COUMADIN    Hospital Course:  [name] is a 41 year old female with a history of atrial fibrillation and a complicated MV repair for HOCM which ultimately led to her getting an ICD. She had an episode of syncope and was seen in the office. The syncope was secondary to VF with VT seen as well. She was admitted for further evaluation and treatment.  Tikosyn was felt to be the best option. She had been off amiodarone long enough to go ahead and start the Tikosyn. She has an underlying LBBB, so her QTc is prolonged at baseline. It did not change much on the Tikosyn. Her labs were checked and followed closely during her stay. She required potassium supplementation, but her magnesium was 2.0 and was stable.   She was seen by Case Management to assure her access to the medication. Her rhythm was followed closely but she maintained SR during her stay. She has a history of atrial fibrillation but is not on anticoagulation because of a post-operative hemorrhagic effusion. The risk/benefit of anticoagulation can be re-assessed as an outpatient.  By 06/16/2012, [name] had completed the required loading period for Tikosyn without complication. She was ambulating without chest pain or SOB and considered stable for discharge, to follow up as an outpatient.  [name] requested Rx for Lortabs, stating she could not find the prescription written for her. Pt was given 10 tabs and was advised we will not refill these, she will have to contact Dr Cornelius Moras or primary MD for  further pain management.  Labs:  Lab Results  Component Value Date   WBC 11.5* 06/12/2012   HGB 14.6 06/12/2012   HCT 44.5 06/12/2012   MCV 96.7 06/12/2012   PLT 278 06/12/2012    Lab 06/16/12 0450 06/12/12 2016  NA 139 --  K 4.4 --  CL 104 --  CO2 28 --  BUN 16 --  CREATININE 0.75 --  CALCIUM 9.5 --  PROT -- 7.1  BILITOT -- 1.1  ALKPHOS -- 93  ALT -- 22  AST -- 18  GLUCOSE 87 --      Radiology: Dg Chest Port 1 View 06/12/2012  *RADIOLOGY REPORT*  Clinical Data:  Ventricular fibrillation.  PORTABLE CHEST - 1 VIEW 06/12/2012 1859 hours:  Comparison: Two-view chest x-ray 04/10/2012, 03/19/2012, 08/14/2011.  Findings: Prior sternotomy.  Cardiac silhouette markedly enlarged but stable.  Pulmonary vascularity normal without evidence of pulmonary edema.  Volume loss in the left lower lobe with elevation of the left hemidiaphragm.  Lungs otherwise clear.  Left subclavian pacing defibrillator unchanged.  IMPRESSION: Stable marked cardiomegaly without pulmonary edema.  Left lower lobe atelectasis.   Original Report Authenticated By: Arnell Sieving, M.D.    EKG: 15-Jun-2012 14:40:13   Sinus bradycardia Possible Left atrial enlargement Left axis deviation Left bundle branch block Vent. rate 59 BPM PR interval 168 Karen QRS duration 176 Karen QT/QTc 564/558 Karen P-R-T axes 55 -36 130  Echo: 06/13/2012 Study Conclusions - Left ventricle: The cavity size was mildly dilated. Wall thickness was increased in a pattern of severe LVH. There was severe concentric hypertrophy. Systolic function was moderately to severely reduced. The estimated ejection fraction was in the range of 30% to 35%. There is akinesis of the entireanteroseptal and inferior myocardium. - Ventricular septum: Septal motion showed abnormal function and dyssynergy. - Aortic valve: The LVOT is narrow. Mild regurgitation. - Mitral valve: Prior procedures included surgical repair. The findings are consistent with mild to moderate stenosis. - Left atrium: The atrium was severely dilated.    FOLLOW UP PLANS AND APPOINTMENTS Allergies  Allergen Reactions  . Warfarin And Related     Skin reaction     Medication List     As of 06/16/2012  4:04 PM    STOP taking these medications         metoprolol 50 MG tablet   Commonly known as: LOPRESSOR      spironolactone 25 MG tablet   Commonly known as: ALDACTONE      TAKE these medications         aspirin 325 MG buffered tablet   Take 325 mg by  mouth daily.      beclomethasone 80 MCG/ACT inhaler   Commonly known as: QVAR   Inhale 1 puff into the lungs 2 (two) times daily.      cetirizine 10 MG tablet   Commonly known as: ZYRTEC   Take 10 mg by mouth daily.      dofetilide 500 MCG capsule   Commonly known as: TIKOSYN   Take 1 capsule (500 mcg total) by mouth every 12 (twelve) hours.      furosemide 20 MG tablet   Commonly known as: LASIX   Take 1 tablet (20 mg total) by mouth daily.      HYDROcodone-acetaminophen 7.5-500 MG per tablet   Commonly known as: LORTAB   TAKE 1 TABLET BY MOUTH EVERY 4-6 HOURS AS NEEDED FOR PAIN      metoprolol succinate 25 MG 24 hr tablet  Commonly known as: TOPROL-XL   Take 1 tablet (25 mg total) by mouth daily.      ondansetron 4 MG tablet   Commonly known as: ZOFRAN   Take 4 mg by mouth as needed. For nausea and vomiting             Discharge Orders    Future Appointments: Provider: Department: Dept Phone: Center:   06/23/2012 11:45 AM Marinus Maw, MD [facility] [phone] [facility]   08/07/2012 8:30 AM Tonny Bollman, MD [facility] [phone] [facility]     Follow-up Information    Follow up with Lewayne Bunting, MD. On 06/23/2012. (11:45 am)    Contact information:   [address] [place] [address] [phone]          BRING ALL MEDICATIONS WITH YOU TO FOLLOW UP APPOINTMENTS  Time spent with patient to include physician time: 34 min Signed: Theodore Demark 06/16/2012, 9:10 AM Co-Sign MD

## 2012-06-17 ENCOUNTER — Telehealth: Payer: Self-pay | Admitting: Cardiovascular Disease

## 2012-06-17 MED ORDER — DABIGATRAN ETEXILATE MESYLATE 150 MG PO CAPS: 150.0000 mg | ORAL_CAPSULE | Freq: Two times a day (BID) | ORAL | Status: DC

## 2012-06-17 NOTE — Telephone Encounter (Signed)
New Problem:    Patient called in wanting to know if she supposed to be taking Pradaxa.  Please call back.

## 2012-06-17 NOTE — Telephone Encounter (Signed)
I reviewed the pt's discharge summary and it states:  She has a history of atrial fibrillation but is not on anticoagulation because of a post-operative hemorrhagic effusion. The risk/benefit of anticoagulation can be re-assessed as an outpatient.  Pradaxa was not listed as a discharge medication.  I will forward this to Dr Excell Seltzer for review and determine if the pt needs to remain off of this medication. The pt is not scheduled to see Dr Excell Seltzer until 08/07/12.

## 2012-06-17 NOTE — Telephone Encounter (Signed)
I spoke with the pt and made her aware that Dr Excell Seltzer does recommend she restart Pradaxa.  Rx sent to pharmacy per pt's request.

## 2012-06-17 NOTE — Telephone Encounter (Signed)
Her effusion has resolved. I think she should be back on Pradaxa to reduce risk of thromboembolism with AF. thx

## 2012-06-18 NOTE — H&P (Signed)
This represents my H and P created in the office and did not get EPIC attached.  See notes  HPI: The patient is seen as an add-on today. She is complex medical history with a history of HOCM, mitral valve repair, complicated hospital course, UE DVT, subsequent pericardial tamponade while on anticoagulant therapy. This was recently nicely summarized by Tereso Newcomer as follows:  She has a h/o HOCM. She underwent a septal myomectomy and mitral valve repair at [facility] in 12/2011. Postoperative course was c/b VT, A. Fib and left UE DVT. She had AICD implanted. She was placed on Coumadin and developed necrotic skin reaction that was transitioned over to Pradaxa. 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 She developed a hemorrhagic effusion. Her Pradaxa was discontinued. She eventually progressed to tamponade and ultimately underwent pericardial window with Dr. Cornelius Moras. She had difficulty with hypotension. Echo 03/19/12: Severe asymmetric septal hypertrophy, evidence of upper septal myomectomy, peak LV outflow tract gradient 35, EF 35%, indeterminate diastolic function, no mitral valve SAM, hockey-stick appearance of the mitral valve, moderate MAC, trivial MR, probable mild to moderate mitral stenosis but no mean gradient measured across the mitral valve, moderate LAE, trivial pericardial effusion.  Yesterday she had a couple of drinks prior to 5-7, went out in the yard and got dizzy, room started to spin, and she went out. She does not remember much but was out for about a minute. Interrogation today reveals evidence of VF with shocks and two additional episodes of VT not resulting in shocks. She is on several meds, and is admitted now for observation and likely optimization of both electrolytes and possible add on therapy.  Current Outpatient Prescriptions   Medication  Sig  Dispense  Refill   .  beclomethasone (QVAR) 80 MCG/ACT inhaler  Inhale 1 puff into the lungs 2 (two) times daily.       .  cetirizine (ZYRTEC) 10 MG tablet  Take 10 mg by mouth daily.     .  furosemide (LASIX) 20 MG tablet  Take 1 tablet (20 mg total) by mouth 2 (two) times daily.  60 tablet  3   .  metoprolol (LOPRESSOR) 50 MG tablet  Take 25 mg by mouth 1 day or 1 dose. 1/2 TAB DAILY     .  ondansetron (ZOFRAN) 4 MG tablet  Take 4 mg by mouth as needed. For nausea and vomiting     .  spironolactone (ALDACTONE) 25 MG tablet  Take 0.5 tablets (12.5 mg total) by mouth daily.     Marland Kitchen  HYDROcodone-acetaminophen (LORTAB) 7.5-500 MG per tablet  TAKE 1 TABLET BY MOUTH EVERY 4-6 HOURS AS NEEDED FOR PAIN  40 tablet  0    Allergies   Allergen  Reactions   .  Warfarin And Related      Skin reaction    Past Medical History   Diagnosis  Date   .  Hypertrophic cardiomyopathy      s/p septal myomectomy with implantable defibrillator 01/27/2012   .  Anxiety state, unspecified    .  Allergic rhinitis      to dogs   .  Asthma    .  History of anemia      during pregnancy   .  History of chicken pox    .  Generalized headaches    .  Ocular migraine      no HA   .  Back pain      told  cracked bone, vicodin didn't help, improved with percocets/muscle relaxants, no eval by prior PCP   .  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  DVT (deep venous thrombosis)      Diagnosed 02/13/12 - was placed on Warfarin after cardiac surgery but had skin reaction to warfarin so was changed to Pradaxa so DVT occurred while on Pradaxa although unclear if it occurred during transition   .  Hypotension    .  Atrial fibrillation      a. On pradaxa. coumadin necrosis with warfarin; b. Amio d/c'd 02/2012   .  ICD (implantable cardiac defibrillator) in place      02/04/2012, [employer]   .  Pericardial effusion    .  Chronic combined systolic and diastolic CHF (congestive heart failure)      echo 03/19/12: severe asymmetric septal hypertrophy, evidence of upper septal myomectomy,  peak LV outflow tract gradient 35, EF 35%, indeterminate diastolic function, no mitral valve SAM, hockey-stick appearance of the mitral valve, moderate MAC, trivial MR, probable mild to moderate mitral stenosis but no mean gradient measured across the mitral valve, moderate LAE, trivial pericardial effusion.   .  Pericardial tamponade  03/14/2012    Past Surgical History   Procedure  Date   .  Cesarean section  1991   .  Induced abortion  1990 and 1993   .  US echocardiography  08/2011     severe LVH, severe asymmetric hypertrophy, EF 50-55%, grade 2 diastolic dysfunction   .  Cardiac surgery  01/27/2012     median sternotomy, septal myectomy, MVR - [facility] (Dr. Silvestre Mesi)   .  Cardiac defibrillator placement  01/2012   .  Myomectomy    .  Pericardial window  03/14/2012     Procedure: PERICARDIAL WINDOW; Surgeon: Purcell Nails, MD; Location: [facility] OR; Service: Open Heart Surgery; Laterality: N/A; Subxyphoid Pericardial Window    Family History   Problem  Relation  Age of Onset   .  Diabetes  Father    .  Heart disease  Father       unspecified heart problem    .  Thyroid disease  Mother    .  Anemia  Mother       mediterranean anemia, ITP    .  Heart disease  Maternal Grandfather       CHF    .  Diabetes  Paternal Grandfather    .  Cancer  Paternal Grandfather    .  Coronary artery disease  Neg Hx    .  Stroke  Neg Hx    .  Sudden death  Neg Hx     History    Social History   .  Marital Status:  Single     Spouse Name:  N/A     Number of Children:  1   .  Years of Education:  N/A    Occupational History   .  Naval architect     Social History Main Topics   .  Smoking status:  Former Smoker -- 1.0 packs/day for 3 years     Types:  Cigarettes     Quit date:  09/30/1988   .  Smokeless tobacco:  Never Used     Comment: quitin 1990    .  Alcohol Use:  0.0 oz/week      rarely drinks wine   .  Drug Use:  No      quiti n 1989   .  Sexually Active:  Yes     Birth Control/  Protection:  Pill    Other Topics  Concern   .  Not on file    Social History Narrative    Caffeine: 1 cup coffee/dayDivorced, 1 child. 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 All other systems reviewed and negative.  PHYSICAL EXAM:  BP 110/66  Pulse 82  Ht 5' (1.524 m)  Wt 185 lb 6.4 oz (84.097 kg)  BMI 36.21 kg/m2  General: Well developed, well nourished, in no acute distress.  Head: Normocephalic and atraumatic.  Neck: no JVD. Median sternotomy healing.  Lungs: Clear to auscultation and percussion.  Heart: Normal S1 and S2. Scratchy murmur, ? Rub.  Abdomen: Normal bowel sounds; soft; non tender; no organomegaly  Pulses: Pulses normal in all 4 extremities.  Extremities: No clubbing or cyanosis. Trace edema  Neurologic: Alert and oriented x 3.  EKG: NSR. LBBB.  ASSESSMENT AND PLAN:  1. VF sp surgery for HOCM, with septal myotomy, post op arrhythmia with defib,  2. SP pericardial window  Plan  1. Admit  2. Repeat echo.  2. Labs  3. Observation  4. EP to see tomorrow.

## 2012-06-19 ENCOUNTER — Other Ambulatory Visit: Payer: Self-pay | Admitting: Internal Medicine

## 2012-06-19 ENCOUNTER — Other Ambulatory Visit: Payer: Self-pay | Admitting: *Deleted

## 2012-06-19 MED ORDER — DOFETILIDE 500 MCG PO CAPS: 500.0000 ug | ORAL_CAPSULE | Freq: Two times a day (BID) | ORAL | Status: DC

## 2012-06-19 NOTE — Telephone Encounter (Signed)
Pt ID  [id number] L  PA # [id number] is good for one year

## 2012-06-19 NOTE — Telephone Encounter (Signed)
New Problem:    Called in with a question about the patient's dofetilide (TIKOSYN) 500 MCG capsule.  Please call back.

## 2012-06-19 NOTE — Telephone Encounter (Signed)
New problem:  Need prior approval.

## 2012-06-22 ENCOUNTER — Telehealth: Payer: Self-pay | Admitting: Cardiovascular Disease

## 2012-06-22 NOTE — Telephone Encounter (Signed)
I spoke with the pt and made her aware that I have a one week supply of Tikosyn available for her to pick up at the front desk. The pt states that the pharmacy is having difficulty ordering this medication.  The medication was suppose to arrive today but now it will not be delivered until Thursday.  The pt will pick-up samples at the front desk.

## 2012-06-22 NOTE — Telephone Encounter (Signed)
Pt calling re getting a call from her pharmacy this am and they wont get tikosyn until Thursday, said lauren told her "our pharmacy" has some and she would like enough to last until thursday

## 2012-06-23 ENCOUNTER — Encounter: Payer: 59 | Admitting: Internal Medicine

## 2012-06-23 ENCOUNTER — Other Ambulatory Visit: Payer: Self-pay | Admitting: *Deleted

## 2012-06-23 DIAGNOSIS — R079 Chest pain, unspecified: Secondary | ICD-10-CM

## 2012-06-26 ENCOUNTER — Ambulatory Visit
Admission: RE | Admit: 2012-06-26 | Discharge: 2012-06-26 | Disposition: A | Discharge status: Home / Self Care | Payer: Medicaid Other | Source: Ambulatory Visit | Attending: Thoracic Surgery (Cardiothoracic Vascular Surgery) | Admitting: Thoracic Surgery (Cardiothoracic Vascular Surgery)

## 2012-06-26 ENCOUNTER — Other Ambulatory Visit: Payer: Medicaid Other

## 2012-06-26 DIAGNOSIS — R079 Chest pain, unspecified: Secondary | ICD-10-CM

## 2012-06-26 MED ORDER — IOHEXOL 300 MG/ML  SOLN
75.0000 mL | Freq: Once | INTRAMUSCULAR | Status: AC | PRN
  Administered 2012-06-26: 75 mL via INTRAVENOUS

## 2012-06-29 ENCOUNTER — Encounter: Payer: Self-pay | Admitting: Thoracic Surgery (Cardiothoracic Vascular Surgery)

## 2012-06-29 ENCOUNTER — Ambulatory Visit (INDEPENDENT_AMBULATORY_CARE_PROVIDER_SITE_OTHER): Payer: Medicaid Other | Admitting: Thoracic Surgery (Cardiothoracic Vascular Surgery)

## 2012-06-29 VITALS — BP 89/60 | HR 81 | Resp 16 | Ht 60.0 in | Wt 190.0 lb

## 2012-06-29 DIAGNOSIS — I314 Cardiac tamponade: Secondary | ICD-10-CM

## 2012-06-29 DIAGNOSIS — G8918 Other acute postprocedural pain: Secondary | ICD-10-CM

## 2012-06-29 DIAGNOSIS — Z09 Encounter for follow-up examination after completed treatment for conditions other than malignant neoplasm: Secondary | ICD-10-CM

## 2012-06-29 NOTE — Progress Notes (Signed)
[address]            [name] [address]          [phone]     CARDIOTHORACIC SURGERY OFFICE NOTE  Referring Provider is Karen Bollman, MD PCP is Karen Boyden, MD   HPI:  Patient is a 41 year old obese female who underwent septal myomectomy and mitral valve repair at [facility] last April. She also had a defibrillator placed during that hospitalization. Her postoperative recovery was complicated and prolonged. In June she presented with a hemorrhagic pericardial effusion and pericardial tamponade that required emergency subxiphoid pericardial window on 03/14/2012. She recovered from this uneventfully and was last seen here in our office on 05/11/2012.  She was referred back to our office today because of continued complaints of pain across her chest. She states that ever since her surgery last April she has had pain that radiates across her chest that is exacerbated by movement and bothers her particularly at night when she is trying to sleep. She feels as though the defibrillator is moving and this is quite painful. She was seen by her primary care physician and given a prescription for Xanax but she states that this is not helping and she feels that she needs oral narcotic pain relievers. She denies any fevers or chills. She denies any sensation of clicking or motion and the sternum. She's not having shortness of breath. The remainder of her review of systems is noncontributory.   Current Outpatient Prescriptions  Medication Sig Dispense Refill  . aspirin 325 MG buffered tablet Take 325 mg by mouth daily.      . beclomethasone (QVAR) 80 MCG/ACT inhaler Inhale 1 puff into the lungs 2 (two) times daily.      . cetirizine (ZYRTEC) 10 MG tablet Take 10 mg by mouth daily.        . dabigatran (PRADAXA) 150 MG CAPS Take 1 capsule (150 mg total) by mouth every 12 (twelve) hours.  60 capsule  6  . dofetilide (TIKOSYN) 500 MCG capsule  Take 1 capsule (500 mcg total) by mouth every 12 (twelve) hours.  60 capsule  11  . furosemide (LASIX) 20 MG tablet Take 1 tablet (20 mg total) by mouth daily.  30 tablet  3  . HYDROcodone-acetaminophen (LORTAB) 7.5-500 MG per tablet Take 1 tablet by mouth every 6 (six) hours as needed for pain. Please call Dr [name] if refill is needed.  10 tablet  0  . metoprolol succinate (TOPROL XL) 25 MG 24 hr tablet Take 1 tablet (25 mg total) by mouth daily.  30 tablet  11      Physical Exam:   BP 89/60  Pulse 81  Resp 16  Ht 5' (1.524 m)  Wt 190 lb (86.183 kg)  BMI 37.11 kg/m2  SpO2 97%  LMP 06/09/2012  General:  Obese but well-appearing  Chest:   Clear to auscultation with symmetrical breath sounds  CV:   Regular rate and rhythm with systolic murmur heard best along the sternal border  Incisions:  All of her surgical incisions have healed nicely. The sternum is stable on palpation but palpation does elicit discomfort. The subxiphoid incision is healed well and there is no sign of epigastric incisional hernia  Abdomen:  Soft and nontender  Extremities:  Warm and well-perfused  Diagnostic Tests:  Transthoracic Echocardiography  Patient: [name], [name] MR #:  [id number] Study Date: 06/13/2012 Gender: F Age: 31 Height: 152.4cm Weight: 84.1kg BSA: 1.24m^2 Pt. Status: Room: 2027  ADMITTING [name] ATTENDING [name] [name], [name], [facility] SONOGRAPHER Georgian Co, RDCS, CCT cc:  ------------------------------------------------------------ LV EF: 30% - 35%  ------------------------------------------------------------ Indications: Abnormal EKG 794.31.  ------------------------------------------------------------ History: PMH: Hypertrophic cardiomyopathy. 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 Post op A-Fib and Tamponade. ICD in place. Mitral valve repair.  Hypertrophic Cardiomyopathy.  ------------------------------------------------------------ Study Conclusions  - Left ventricle: The cavity size was mildly dilated. Wall thickness was increased in a pattern of severe LVH. There was severe concentric hypertrophy. Systolic function was moderately to severely reduced. The estimated ejection fraction was in the range of 30% to 35%. There is akinesis of the entireanteroseptal and inferior myocardium. - Ventricular septum: Septal motion showed abnormal function and dyssynergy. - Aortic valve: The LVOT is narrow. Mild regurgitation. - Mitral valve: Prior procedures included surgical repair. The findings are consistent with mild to moderate stenosis. - Left atrium: The atrium was severely dilated. Transthoracic echocardiography. M-mode, complete 2D, spectral Doppler, and color Doppler. Height: Height: 152.4cm. Height: 60in. Weight: Weight: 84.1kg. Weight: 185lb. Body mass index: BMI: 36.2kg/m^2. Body surface area: BSA: 1.10m^2. Blood pressure: 99/55. Patient status: Inpatient. Location: Bedside.  ------------------------------------------------------------  ------------------------------------------------------------ Left ventricle: The cavity size was mildly dilated. Wall thickness was increased in a pattern of severe LVH. There was severe concentric hypertrophy. Systolic function was moderately to severely reduced. The estimated ejection fraction was in the range of 30% to 35%. Regional wall motion abnormalities: There is akinesis of the entireanteroseptal and inferior myocardium.  ------------------------------------------------------------ Aortic valve: The LVOT is narrow. Structurally normal valve. Cusp separation was normal. Doppler: Transvalvular velocity was within the normal range. There was no stenosis. Mild regurgitation.  ------------------------------------------------------------ Aorta: Aortic root: The aortic root  was normal in size. Ascending aorta: The ascending aorta was normal in size.  ------------------------------------------------------------ Mitral valve: Moderately thickened leaflets . Prior procedures included surgical repair. Doppler: The findings are consistent with mild to moderate stenosis. Trivial regurgitation. Peak gradient: 11mm Hg (D).  ------------------------------------------------------------ Left atrium: The atrium was severely dilated.  ------------------------------------------------------------ Right ventricle: The cavity size was normal. Wall thickness was normal. Pacer wire or catheter noted in right ventricle. Systolic function was normal.  ------------------------------------------------------------ Ventricular septum: Septal motion showed abnormal function and dyssynergy.  ------------------------------------------------------------ Pulmonic valve: Poorly visualized. Doppler: Trivial regurgitation.  ------------------------------------------------------------ Tricuspid valve: Poorly visualized. Doppler: No significant regurgitation.  ------------------------------------------------------------ Right atrium: Poorly visualized. The atrium was normal in size.  ------------------------------------------------------------ Pericardium: There was no pericardial effusion.  ------------------------------------------------------------ Systemic veins: Inferior vena cava: The vessel was normal in size; the respirophasic diameter changes were in the normal range (= 50%); findings are consistent with normal central venous pressure.  ------------------------------------------------------------  2D measurements Normal Doppler measurements Normal Left ventricle Mitral valve LVID ED, 40.4 mm 43-52 Peak E vel 163 cm/s ------ chord, Peak A vel 195 cm/s ------ PLAX Peak 11 mm ------ LVID ES, 28.1 mm 23-38 gradient, D Hg chord, Peak E/A 0.84 ------ PLAX ratio FS,  chord, 30 % >29 Systemic veins PLAX Estimated 5 mm ------ LVPW, ED 15.8 mm ------ CVP Hg IVS/LVPW 1.34 <1.3 ratio, ED Ventricular septum IVS, ED 21.2 mm ------ Aorta Root diam 29 mm ------ Left atrium AP dim ES, 60 mm 23-38 PLAX Vol, S 120 ml ------ Vol index, 62.2 ml/m^2 ------ S LA/Ao root 2.07 ------ ratio  ------------------------------------------------------------ Prepared and Electronically Authenticated by  Arvilla Meres 2013-09-14T16:12:54.717    CT CHEST  WITH CONTRAST  Technique: Multidetector CT imaging of the chest was performed  following the standard protocol during bolus administration of  intravenous contrast.  Contrast: 75mL OMNIPAQUE IOHEXOL 300 MG/ML SOLN  Comparison: 03/02/2012  Findings: Pericardial effusion has now resolved. Cardiomegaly with  left atrial and ventricular enlargement is again demonstrated.  AICD remains in place. Elevation of left hemidiaphragm again  noted. No evidence of pleural effusion.  Minimal atelectasis is noted in the left lung base, however lungs  otherwise clear. No evidence of pulmonary infiltrate or suspicious  pulmonary nodules masses. No central endobronchial lesion  identified.  There is no evidence of hilar or mediastinal lymphadenopathy. No  adenopathy seen elsewhere within the thorax. No masses or fluid  collections are seen in the subxiphoid region or elsewhere within  the chest wall soft tissues.  IMPRESSION:  1. No acute findings.  2. Elevated left hemidiaphragm with mild left basilar atelectasis.  3. Stable cardiomegaly with left atrial ventricular enlargement.  Original Report Authenticated By: Danae Orleans, M.D.    Impression:  The patient's continued complaints of pain clearly seen in the renal related to her previous open heart surgery and defibrillator placement performed at [facility] last April. Echocardiogram and CT scan both looked good with no pericardial effusion and  intact sternal wires and the sternum. There are definitely no complications related to her subxiphoid pericardial window.   Plan:  I reassured the patient that there does not appear to be anything particular wrong with her previous sternotomy. I've reminded the patient to continue to avoid any heavy lifting or strenuous use of her arms or shoulders which might exacerbate her chronic pain. He continues to have trouble she will need to return to [facility] where her surgical procedures were performed. I've given her one prescription for Vicodin tablets to help in the short-term, but we will no longer refill any oral narcotics for this patient in our office.   Salvatore Decent. Karen Moras, MD 06/29/2012 9:41 AM

## 2012-07-16 ENCOUNTER — Encounter: Payer: Self-pay | Admitting: Internal Medicine

## 2012-07-16 ENCOUNTER — Ambulatory Visit (INDEPENDENT_AMBULATORY_CARE_PROVIDER_SITE_OTHER): Payer: Medicaid Other | Admitting: Internal Medicine

## 2012-07-16 VITALS — BP 86/62 | HR 79 | Ht 60.0 in | Wt 187.0 lb

## 2012-07-16 DIAGNOSIS — Z95810 Presence of automatic (implantable) cardiac defibrillator: Secondary | ICD-10-CM

## 2012-07-16 DIAGNOSIS — I4901 Ventricular fibrillation: Secondary | ICD-10-CM

## 2012-07-16 DIAGNOSIS — I5042 Chronic combined systolic (congestive) and diastolic (congestive) heart failure: Secondary | ICD-10-CM

## 2012-07-16 DIAGNOSIS — I509 Heart failure, unspecified: Secondary | ICD-10-CM

## 2012-07-16 NOTE — Progress Notes (Signed)
HPI [name] returns today for followup. She is a 41 year old woman status post septal myomectomy for hypertrophic cardiomyopathy, ventricular fibrillation, status post ICD implantation. The patient has been stable in the interim. She had a near syncopal episode associated with an ICD shock several weeks ago. She denies chest pain. With strenuous exertion, she would get short of breath. Minimal peripheral edema. Allergies  Allergen Reactions  . Warfarin And Related     Skin reaction     Current Outpatient Prescriptions  Medication Sig Dispense Refill  . ALPRAZolam (XANAX) 0.5 MG tablet Take 0.5 mg by mouth at bedtime as needed.      Marland Kitchen aspirin 325 MG buffered tablet Take 325 mg by mouth daily.      . beclomethasone (QVAR) 80 MCG/ACT inhaler Inhale 1 puff into the lungs 2 (two) times daily.      . cetirizine (ZYRTEC) 10 MG tablet Take 10 mg by mouth daily.        . dabigatran (PRADAXA) 150 MG CAPS Take 1 capsule (150 mg total) by mouth every 12 (twelve) hours.  60 capsule  6  . dofetilide (TIKOSYN) 500 MCG capsule Take 1 capsule (500 mcg total) by mouth every 12 (twelve) hours.  60 capsule  11  . furosemide (LASIX) 20 MG tablet Take 1 tablet (20 mg total) by mouth daily.  30 tablet  3  . HYDROcodone-acetaminophen (LORTAB) 7.5-500 MG per tablet Take 1 tablet by mouth every 6 (six) hours as needed for pain. Please call Dr Cornelius Moras if refill is needed.  10 tablet  0  . metoprolol succinate (TOPROL XL) 25 MG 24 hr tablet Take 1 tablet (25 mg total) by mouth daily.  30 tablet  11     Past Medical History  Diagnosis Date  . Hypertrophic cardiomyopathy     s/p septal myomectomy with implantable defibrillator 01/27/2012   . Anxiety state, unspecified   . Allergic rhinitis     to dogs  . Asthma   . History of anemia     during pregnancy  . History of chicken pox   . Generalized headaches   . Ocular migraine     no HA  . Back pain     told cracked bone, vicodin didn't help, improved with  percocets/muscle relaxants, no eval by prior PCP  . 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 DVT (deep venous thrombosis)     Diagnosed 02/13/12  - was placed on Warfarin after cardiac surgery but had skin reaction to warfarin so was changed to Pradaxa so DVT occurred while on Pradaxa although unclear if it occurred during transition  . Hypotension   . Atrial fibrillation     a. On pradaxa.  coumadin necrosis with warfarin;  b. Amio d/c'd 02/2012  . ICD (implantable cardiac defibrillator) in place     02/04/2012, [facility]  . Pericardial effusion   . Chronic combined systolic and diastolic CHF (congestive heart failure)     echo 03/19/12: severe asymmetric septal hypertrophy, evidence of upper septal myomectomy, peak LV outflow tract gradient 35, EF 35%, indeterminate diastolic function, no mitral valve SAM, hockey-stick appearance of the mitral valve, moderate MAC, trivial MR, probable mild to moderate mitral stenosis but no mean gradient measured across the mitral valve, moderate LAE, trivial pericardial effusion.   . Pericardial tamponade 03/14/2012  . Ventricular fibrillation 06/13/2012  . Skin necrosis --COUMADIN   . Chronic chest wall  pain     eval by CVTS, stable, rec return to [facility] if continued pain    ROS:   All systems reviewed and negative except as noted in the HPI.   Past Surgical History  Procedure Date  . Cesarean section 1991  . Induced abortion 1990 and 1993  . US echocardiography 08/2011    severe LVH, severe asymmetric hypertrophy, EF 50-55%, grade 2 diastolic dysfunction  . Cardiac surgery 01/27/2012    median sternotomy, septal myectomy, MVR - [facility] (Dr. Silvestre Mesi)  . Cardiac defibrillator placement 01/2012  . Myomectomy   . Pericardial window 03/14/2012    Procedure: PERICARDIAL WINDOW;  Surgeon: Purcell Nails, MD;  Location: [facility] OR;  Service: Open Heart Surgery;  Laterality: N/A;  Subxyphoid Pericardial   Window      Family History  Problem Relation Age of Onset  . Diabetes Father   . Heart disease Father     unspecified heart problem  . Thyroid disease Mother   . Anemia Mother     mediterranean anemia, ITP  . Heart disease Maternal Grandfather     CHF  . Diabetes Paternal Grandfather   . Cancer Paternal Grandfather   . Coronary artery disease Neg Hx   . Stroke Neg Hx   . Sudden death Neg Hx      History   Social History  . Marital Status: Single    Spouse Name: N/A    Number of Children: 1  . Years of Education: N/A   Occupational History  . Naval architect    Social History Main Topics  . Smoking status: Former Smoker -- 1.0 packs/day for 3 years    Types: Cigarettes    Quit date: 09/30/1988  . Smokeless tobacco: Never Used   Comment: quitin 1990  . Alcohol Use: 0.0 oz/week     rarely drinks wine  . Drug Use: No     quiti n 1989  . Sexually Active: Yes    Birth Control/ Protection: Pill   Other Topics Concern  . Not on file   Social History Narrative   Caffeine: 1 cup coffee/dayDivorced, 1 child.  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 HEART:  Regular rate rhythm, grade 1/6 systolic murmur, no rubs, no clicks Abd:  soft, positive bowel sounds, no organomegally, no rebound, no guarding Ext:  2 plus pulses, no edema, no cyanosis, no clubbing Skin:  No rashes no nodules Neuro:  CN II through XII intact, motor grossly intact  DEVICE  Normal device function.  See PaceArt for details.   Assess/Plan:

## 2012-07-16 NOTE — Assessment & Plan Note (Signed)
Her congestive heart failure symptoms are class II. She will continue her current medical therapy, and maintain a low-sodium diet. 

## 2012-07-16 NOTE — Assessment & Plan Note (Signed)
She has had successful defibrillation therapy secondary to ventricular fibrillation. I considered starting antiarrhythmic drug therapy on the patient today but would rather wait and see whether or not she has additional VT or VF.

## 2012-07-16 NOTE — Assessment & Plan Note (Signed)
Her [employer] ICD is working normally. We'll plan to recheck in several months. 

## 2012-07-16 NOTE — Patient Instructions (Signed)
Your physician wants you to follow-up in: 6 months with Dr Taylor You will receive a reminder letter in the mail two months in advance. If you don't receive a letter, please call our office to schedule the follow-up appointment.  

## 2012-07-22 ENCOUNTER — Inpatient Hospital Stay (HOSPITAL_COMMUNITY)
Admission: EM | Admit: 2012-07-22 | Discharge: 2012-07-27 | DRG: 309 | Disposition: A | Discharge status: Home / Self Care | Payer: Medicaid Other | Attending: Cardiovascular Disease | Admitting: Cardiovascular Disease

## 2012-07-22 ENCOUNTER — Emergency Department (HOSPITAL_COMMUNITY): Payer: Medicaid Other

## 2012-07-22 ENCOUNTER — Encounter (HOSPITAL_COMMUNITY): Payer: Self-pay

## 2012-07-22 ENCOUNTER — Encounter: Payer: Self-pay | Admitting: Cardiovascular Disease

## 2012-07-22 DIAGNOSIS — I4729 Other ventricular tachycardia: Secondary | ICD-10-CM | POA: Diagnosis present

## 2012-07-22 DIAGNOSIS — R110 Nausea: Secondary | ICD-10-CM

## 2012-07-22 DIAGNOSIS — Z7982 Long term (current) use of aspirin: Secondary | ICD-10-CM

## 2012-07-22 DIAGNOSIS — F411 Generalized anxiety disorder: Secondary | ICD-10-CM | POA: Diagnosis present

## 2012-07-22 DIAGNOSIS — I472 Ventricular tachycardia, unspecified: Secondary | ICD-10-CM | POA: Diagnosis present

## 2012-07-22 DIAGNOSIS — I498 Other specified cardiac arrhythmias: Secondary | ICD-10-CM | POA: Diagnosis present

## 2012-07-22 DIAGNOSIS — I4901 Ventricular fibrillation: Principal | ICD-10-CM | POA: Diagnosis present

## 2012-07-22 DIAGNOSIS — Z86718 Personal history of other venous thrombosis and embolism: Secondary | ICD-10-CM

## 2012-07-22 DIAGNOSIS — J45909 Unspecified asthma, uncomplicated: Secondary | ICD-10-CM | POA: Diagnosis present

## 2012-07-22 DIAGNOSIS — R55 Syncope and collapse: Secondary | ICD-10-CM | POA: Diagnosis present

## 2012-07-22 DIAGNOSIS — Z79899 Other long term (current) drug therapy: Secondary | ICD-10-CM

## 2012-07-22 DIAGNOSIS — Z7901 Long term (current) use of anticoagulants: Secondary | ICD-10-CM

## 2012-07-22 DIAGNOSIS — I4891 Unspecified atrial fibrillation: Secondary | ICD-10-CM | POA: Diagnosis present

## 2012-07-22 DIAGNOSIS — I5042 Chronic combined systolic (congestive) and diastolic (congestive) heart failure: Secondary | ICD-10-CM | POA: Diagnosis present

## 2012-07-22 DIAGNOSIS — I447 Left bundle-branch block, unspecified: Secondary | ICD-10-CM | POA: Diagnosis present

## 2012-07-22 DIAGNOSIS — F418 Other specified anxiety disorders: Secondary | ICD-10-CM | POA: Diagnosis present

## 2012-07-22 DIAGNOSIS — Z95810 Presence of automatic (implantable) cardiac defibrillator: Secondary | ICD-10-CM

## 2012-07-22 DIAGNOSIS — I509 Heart failure, unspecified: Secondary | ICD-10-CM | POA: Diagnosis present

## 2012-07-22 DIAGNOSIS — I4581 Long QT syndrome: Secondary | ICD-10-CM | POA: Diagnosis present

## 2012-07-22 HISTORY — DX: Syncope and collapse: R55

## 2012-07-22 LAB — CBC WITH DIFFERENTIAL/PLATELET
Basophils Absolute: 0.1 10*3/uL (ref 0.0–0.1)
Basophils Relative: 1 % (ref 0–1)
Eosinophils Relative: 7 % — ABNORMAL HIGH (ref 0–5)
HCT: 39.9 % (ref 36.0–46.0)
Hemoglobin: 12.4 g/dL (ref 12.0–15.0)
MCHC: 31.1 g/dL (ref 30.0–36.0)
MCV: 77.5 fL — ABNORMAL LOW (ref 78.0–100.0)
Monocytes Absolute: 0.8 10*3/uL (ref 0.1–1.0)
Monocytes Relative: 11 % (ref 3–12)
Neutro Abs: 4.6 10*3/uL (ref 1.7–7.7)
RDW: 17.1 % — ABNORMAL HIGH (ref 11.5–15.5)

## 2012-07-22 LAB — POCT I-STAT, CHEM 8
Calcium, Ion: 1.08 mmol/L — ABNORMAL LOW (ref 1.12–1.23)
Chloride: 101 mEq/L (ref 96–112)
Creatinine, Ser: 1 mg/dL (ref 0.50–1.10)
Glucose, Bld: 106 mg/dL — ABNORMAL HIGH (ref 70–99)
Hemoglobin: 14.3 g/dL (ref 12.0–15.0)
Potassium: 3.8 mEq/L (ref 3.5–5.1)

## 2012-07-22 LAB — PROTIME-INR
INR: 1.18 (ref 0.00–1.49)
Prothrombin Time: 14.8 seconds (ref 11.6–15.2)

## 2012-07-22 LAB — APTT: aPTT: 43 seconds — ABNORMAL HIGH (ref 24–37)

## 2012-07-22 MED ORDER — AMIODARONE HCL IN DEXTROSE 360-4.14 MG/200ML-% IV SOLN
60.0000 mg/h | INTRAVENOUS | Status: AC
  Administered 2012-07-22 – 2012-07-23 (×2): 60 mg/h via INTRAVENOUS
  Filled 2012-07-22 (×3): qty 200

## 2012-07-22 MED ORDER — HYDROCODONE-ACETAMINOPHEN 5-325 MG PO TABS
2.0000 | ORAL_TABLET | Freq: Once | ORAL | Status: AC
  Administered 2012-07-22: 2 via ORAL
  Filled 2012-07-22: qty 2

## 2012-07-22 MED ORDER — AMIODARONE HCL IN DEXTROSE 360-4.14 MG/200ML-% IV SOLN
30.0000 mg/h | INTRAVENOUS | Status: DC
  Administered 2012-07-23 – 2012-07-24 (×3): 30 mg/h via INTRAVENOUS
  Filled 2012-07-22 (×6): qty 200

## 2012-07-22 MED ORDER — DABIGATRAN ETEXILATE MESYLATE 150 MG PO CAPS
150.0000 mg | ORAL_CAPSULE | Freq: Two times a day (BID) | ORAL | Status: DC
  Administered 2012-07-23 – 2012-07-27 (×10): 150 mg via ORAL
  Filled 2012-07-22 (×10): qty 1

## 2012-07-22 MED ORDER — DABIGATRAN ETEXILATE MESYLATE 150 MG PO CAPS
150.0000 mg | ORAL_CAPSULE | Freq: Once | ORAL | Status: DC
  Filled 2012-07-22: qty 1

## 2012-07-22 MED ORDER — METOPROLOL TARTRATE 1 MG/ML IV SOLN
2.5000 mg | Freq: Once | INTRAVENOUS | Status: AC
  Administered 2012-07-22: 2.5 mg via INTRAVENOUS
  Filled 2012-07-22: qty 5

## 2012-07-22 MED ORDER — AMIODARONE LOAD VIA INFUSION
150.0000 mg | Freq: Once | INTRAVENOUS | Status: AC
  Administered 2012-07-22: 150 mg via INTRAVENOUS
  Filled 2012-07-22: qty 83.34

## 2012-07-22 MED ORDER — DOFETILIDE 500 MCG PO CAPS: 500.0000 ug | ORAL_CAPSULE | Freq: Two times a day (BID) | ORAL | Status: DC

## 2012-07-22 NOTE — H&P (Signed)
History and Physical  Patient ID: [name] MRN: [id number], SOB: 1971/01/07 41 y.o. Date of Encounter: 07/22/2012, 11:11 PM  Primary Physician: Eustaquio Boyden, MD Primary Cardiologist: Dr. Excell Seltzer, Dr. Ladona Ridgel  Chief Complaint: syncope, ICD shock  HPI: 41 y.o. female w/ PMHx significant for HOCM s/p myomectomy, VT s/p ICD, paroxysmal afib  who presented to [facility] on 07/22/2012 with complaints of syncope and ICD shock.  She reports that she has been in her usual state of health recently except she is feeling more anxious than usual. This evening while washing dishes, she had a syncopal episode. Interrogation of her [employer] device confirmed VT/vfib with high rates (> 190) resulting in directly going to therapy at 41 J which successfully terminated the shock (8:23 pm). She was also noted to have 7 secs of VT/vfib at 6:18 pm which self terminated. History demonstrates several other episodes of ATP as well as self-terminating episodes. Device interrogation by [employer] of [employer], strips and interrogation results left on chart.  Patient denies any recent illness, chest pains (beyond her chronic post surgical pain), weight gain, CHF symptoms, fever chills, illicit drugs, OTC drugs, noncompliance with medications.  EKG revealed sinus rhythm, LBBB with QTc by the computer of 580, my read ~500-550. CXR was without acute cardiopulmonary abnormalities. Electrolytes okay (Mg 2, K 3.8 --> replacing)  While in the ED, she had another episode of syncope, confirmed Vfib on telemetry and she self-terminated without ICD shock.   Past Medical History  Diagnosis Date  . Hypertrophic cardiomyopathy     s/p septal myomectomy with implantable defibrillator 01/27/2012   . Anxiety state, unspecified   . Allergic rhinitis     to dogs  . Asthma   . History of anemia     during pregnancy  . History of chicken pox   . Generalized headaches   . Ocular migraine     no HA  .  Back pain     told cracked bone, vicodin didn't help, improved with percocets/muscle relaxants, no eval by prior PCP  . 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 DVT (deep venous thrombosis)     Diagnosed 02/13/12  - was placed on Warfarin after cardiac surgery but had skin reaction to warfarin so was changed to Pradaxa so DVT occurred while on Pradaxa although unclear if it occurred during transition  . Hypotension   . Atrial fibrillation     a. On pradaxa.  coumadin necrosis with warfarin;  b. Amio d/c'd 02/2012  . ICD (implantable cardiac defibrillator) in place     02/04/2012, [employer]  . Pericardial effusion   . Chronic combined systolic and diastolic CHF (congestive heart failure)     echo 03/19/12: severe asymmetric septal hypertrophy, evidence of upper septal myomectomy, peak LV outflow tract gradient 35, EF 35%, indeterminate diastolic function, no mitral valve SAM, hockey-stick appearance of the mitral valve, moderate MAC, trivial MR, probable mild to moderate mitral stenosis but no mean gradient measured across the mitral valve, moderate LAE, trivial pericardial effusion.   . Pericardial tamponade 03/14/2012  . Ventricular fibrillation 06/13/2012  . Skin necrosis --COUMADIN   . Chronic chest wall pain     eval by CVTS, stable, rec return to [facility] if continued pain     Surgical History:  Past Surgical History  Procedure Date  . Cesarean section 1991  . Induced abortion 1990 and 1993  . US  echocardiography 08/2011    severe LVH, severe asymmetric hypertrophy, EF 50-55%, grade 2 diastolic dysfunction  . Cardiac surgery 01/27/2012    median sternotomy, septal myectomy, MVR - [facility] (Dr. Silvestre Mesi)  . Cardiac defibrillator placement 01/2012  . Myomectomy   . Pericardial window 03/14/2012    Procedure: PERICARDIAL WINDOW;  Surgeon: Purcell Nails, MD;  Location: [facility] OR;  Service: Open Heart Surgery;  Laterality: N/A;  Subxyphoid  Pericardial  Window      Home Meds: Prior to Admission medications   Medication Sig Start Date End Date Taking? Authorizing Provider  acetaminophen (TYLENOL) 500 MG tablet Take 500 mg by mouth every 6 (six) hours as needed. For pain   Yes Historical Provider, MD  ALPRAZolam Prudy Feeler) 0.5 MG tablet Take 0.5 mg by mouth at bedtime as needed. For anxiety   Yes Historical Provider, MD  aspirin 325 MG buffered tablet Take 325 mg by mouth daily.   Yes Historical Provider, MD  beclomethasone (QVAR) 80 MCG/ACT inhaler Inhale 1 puff into the lungs 2 (two) times daily.   Yes Historical Provider, MD  cetirizine (ZYRTEC) 10 MG tablet Take 10 mg by mouth daily.     Yes Historical Provider, MD  dabigatran (PRADAXA) 150 MG CAPS Take 1 capsule (150 mg total) by mouth every 12 (twelve) hours. 06/17/12  Yes Tonny Bollman, MD  dofetilide (TIKOSYN) 500 MCG capsule Take 1 capsule (500 mcg total) by mouth every 12 (twelve) hours. 06/19/12  Yes Marinus Maw, MD  furosemide (LASIX) 20 MG tablet Take 1 tablet (20 mg total) by mouth daily. 06/16/12  Yes Rhonda G Barrett, PA  metoprolol succinate (TOPROL XL) 25 MG 24 hr tablet Take 1 tablet (25 mg total) by mouth daily. 06/16/12  Yes Joline Salt Barrett, PA    Allergies:  Allergies  Allergen Reactions  . Warfarin And Related Other (See Comments)    Scars on skin    History   Social History  . Marital Status: Single    Spouse Name: N/A    Number of Children: 1  . Years of Education: N/A   Occupational History  . Naval architect    Social History Main Topics  . Smoking status: Former Smoker -- 1.0 packs/day for 3 years    Types: Cigarettes    Quit date: 09/30/1988  . Smokeless tobacco: Never Used   Comment: quitin 1990  . Alcohol Use: 0.0 oz/week     rarely drinks wine  . Drug Use: No     quiti n 1989  . Sexually Active: Yes    Birth Control/ Protection: Pill   Other Topics Concern  . Not on file   Social History Narrative   Caffeine: 1 cup  coffee/dayDivorced, 1 child.  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 Diabetes Father   . Heart disease Father     unspecified heart problem  . Thyroid disease Mother   . Anemia Mother     mediterranean anemia, ITP  . Heart disease Maternal Grandfather     CHF  . Diabetes Paternal Grandfather   . Cancer Paternal Grandfather   . Coronary artery disease Neg Hx   . Stroke Neg Hx   . Sudden death Neg Hx     Review of Systems: General: negative for chills, fever, night sweats or weight changes.  Cardiovascular: negative for chest pain,  shortness of breath, dyspnea on exertion, edema, orthopnea,paroxysmal nocturnal dyspnea  Dermatological: negative for rash Respiratory: negative for cough or wheezing Urologic: negative for hematuria Abdominal: negative for nausea, vomiting, diarrhea, bright red blood per rectum, melena, or hematemesis Neurologic: negative for visual changes. +Anxiety All other systems reviewed and are otherwise negative except as noted above.  Labs:   Lab Results  Component Value Date   WBC 7.5 07/22/2012   HGB 12.4 07/22/2012   HGB 14.3 07/22/2012   HCT 39.9 07/22/2012   HCT 42.0 07/22/2012   MCV 77.5* 07/22/2012   PLT 271 07/22/2012    Lab 07/22/12 2204  NA 139  K 3.8  CL 101  CO2 --  BUN 17  CREATININE 1.00  CALCIUM --  PROT --  BILITOT --  ALKPHOS --  ALT --  AST --  GLUCOSE 106*   No results found for this basename: CKTOTAL:4,CKMB:4,TROPONINI:4 in the last 72 hours No results found for this basename: CHOL, HDL, LDLCALC, TRIG   Lab Results  Component Value Date   DDIMER 2.08* 03/09/2012    Radiology/Studies:  Ct Head Wo Contrast  07/22/2012  *RADIOLOGY REPORT*  Clinical Data: Larey Seat and hit back of head.  CT HEAD WITHOUT CONTRAST  Technique:  Contiguous axial images were obtained  from the base of the skull through the vertex without contrast.  Comparison: None.  Findings: There is no evidence of acute intracranial hemorrhage, infarction, subdural hematoma or shift to the midline structures. The brain, ventricles and extra-axial spaces are within normal limits. The bony calvarium is intact.  Skull base is normal.  The paranasal sinuses and mastoid air cells are clear.  IMPRESSION: No evidence of acute trauma.  Normal CT of the brain.   Original Report Authenticated By: Mervin Hack, M.D.    Ct Chest W Contrast  06/26/2012  *RADIOLOGY REPORT*  Clinical Data: Anterior chest pain.  Recent some 05/04 pericardial window for pericardial effusion.  Cardiac defibrillator.  CT CHEST WITH CONTRAST  Technique:  Multidetector CT imaging of the chest was performed following the standard protocol during bolus administration of intravenous contrast.  Contrast: 75mL OMNIPAQUE IOHEXOL 300 MG/ML  SOLN  Comparison: 03/02/2012  Findings: Pericardial effusion has now resolved.  Cardiomegaly with left atrial and ventricular enlargement is again demonstrated. AICD remains in place.  Elevation of left hemidiaphragm again noted.  No evidence of pleural effusion.  Minimal atelectasis is noted in the left lung base, however lungs otherwise clear.  No evidence of pulmonary infiltrate or suspicious pulmonary nodules masses.  No central endobronchial lesion identified.  There is no evidence of hilar or mediastinal lymphadenopathy.  No adenopathy seen elsewhere within the thorax.  No masses or fluid collections are seen in the subxiphoid region or elsewhere within the chest wall soft tissues.  IMPRESSION:  1.  No acute findings. 2.  Elevated left hemidiaphragm with mild left basilar atelectasis. 3.  Stable cardiomegaly with left atrial ventricular enlargement.   Original Report Authenticated By: Danae Orleans, M.D.    Dg Chest Port 1 View  07/22/2012  *RADIOLOGY REPORT*  Clinical Data: The hip, syncope.   PORTABLE CHEST - 1 VIEW  Comparison: 06/26/2012 CT  Findings: Cardiomegaly.  Elevated left hemidiaphragm.  No overt edema or focal consolidation.  No pneumothorax.  Small left pleural effusion not excluded.  Left chest wall battery pack with unchanged lead tip positions.  Status post median sternotomy.  No acute osseous finding.  IMPRESSION: Cardiomegaly, postoperative changes, elevated left hemidiaphragm. Similar  to prior.  No acute process identified.   Original Report Authenticated By: Waneta Martins, M.D.      EKG: EKG revealed sinus rhythm, LBBB with QTc by the computer of 580, my read ~500-550.  Physical Exam: Blood pressure 119/96, pulse 82, temperature 97.8 F (36.6 C), temperature source Oral, resp. rate 16, last menstrual period 07/08/2012, SpO2 97.00%. General: Well developed, well nourished, in no acute distress. Head: Normocephalic, no evidence of injury, sclera non-icteric, nares are without discharge Neck: Supple. Negative for carotid bruits. JVD not elevated. Lungs: Clear bilaterally to auscultation without wheezes, rales, or rhonchi. Breathing is unlabored. Heart: RRR with S1 S2. 3/6 SEM at LUSB. Abdomen: Soft, non-tender, non-distended with normoactive bowel sounds. No rebound/guarding. No obvious abdominal masses. Msk:  Strength and tone appear normal for age. Extremities: Trace edema bilaterally. No clubbing or cyanosis. Distal pedal pulses are 2+ and equal bilaterally. Neuro: Alert and oriented X 3. Moves all extremities spontaneously. Psych:  Responds to questions appropriately with a normal affect.   Problem List 1. Vtach/vfib, recurrent, on Tikosyn 2. Syncope from #1, s/p ICD shock 3. HOCM s/p myomectomy 4. H/o of DVT, on anticoagulation 5. Parox afib, currently in sinus 6. LBBB 7. Chronic heart failure, diastolic, appears currently euvolemic   ASSESSMENT AND PLAN:  41 y.o. female w/ PMHx significant for HOCM s/p myomectomy, VT s/p ICD, paroxysmal afib  who  presented to [facility] on 07/22/2012 with complaints of syncope and ICD shock. Confirmed Vtach/vfib on interrogation of device and also with recurrent episode on telemetry while in the ER.  In regards to the trigger, concern would be for possible Tikosyn toxicity as her QTc is certainly prolonged and may be up to 550 in limb leads. Doubt ischemia or CHF based upon history and exam.  Admit to stepdown, anticipate due to the complexity of her issues that she will require admission rather than observation. Plan at this time is to hold her Tikosyn, provide additional beta blockade with IV BB and start amiodarone IV load and gtt. EP aware and will see her tomorrow.  Optimize K to > 4 with PO KCl. Continue PO BB in addition to the IV given in the ER.  H/o DVT, paroxysmal afib: Continue dabigatran.  Diastolic heart failure: consequence of her HCM. Continue BB, lasix.  Anxiety- continue xanax. Reportedly prescribed zoloft but she is resistant to start. Would re-address (would check interactions).  Prophylaxis: taking PO. On therapeutic anticoagulation.  Signed, Adolm Joseph, Devinn Voshell C. MD 07/22/2012, 11:11 PM

## 2012-07-22 NOTE — ED Notes (Signed)
Patient transported to CT 

## 2012-07-22 NOTE — ED Notes (Signed)
Paged [employer]  

## 2012-07-22 NOTE — ED Notes (Signed)
Pt's husband called out for RN, pt was coming in and out of consciousness. Pt's ECG rhythm was greater than 200. Pt HR slowly declined and pt became more alert. Pt placed on zoll pads

## 2012-07-22 NOTE — ED Notes (Signed)
Pt reports feeling light headed this approx noon today while walking from the bathroom to the living room, pt began feeling dizzy and light headed approx 1830, pt reports became dizzy and  "fainted" approx 2000 this evening. Spouse reports pt began "shaking all over" as if she was having a seizure and fluid in her mouth. Pt reports she had a defibrillator placed in May 2013, sts "this is similar as to when her defibrillator went off last time." pt denies chest pain, sob, but does reports "cramping" under (R) breast.

## 2012-07-22 NOTE — ED Provider Notes (Signed)
History     CSN: [id number]  Arrival date & time 07/22/12  2057   First MD Initiated Contact with Patient 07/22/12 2131      Chief Complaint  Patient presents with  . Near Syncope  . Fall    (Consider location/radiation/quality/duration/timing/severity/associated sxs/prior treatment) Patient is a 41 y.o. female presenting with fall. The history is provided by the patient.  Fall Associated symptoms include headaches. Pertinent negatives include no numbness, no abdominal pain, no nausea and no vomiting.   patient had a syncopal episode. She was washing dishes and then became lightheaded and passed out. She hit her head on the way down. She reportedly shaking all over. She's a history of cardiomyopathy and previous ventricular fibrillation. She states that was like this. She has an AICD. She is on dofetilide. She's been doing well the last couple days but has had other episodes of dizziness 2. Some mild left-sided chest pain.  Past Medical History  Diagnosis Date  . Hypertrophic cardiomyopathy     s/p septal myomectomy with implantable defibrillator 01/27/2012   . Anxiety state, unspecified   . Allergic rhinitis     to dogs  . Asthma   . History of anemia     during pregnancy  . History of chicken pox   . Generalized headaches   . Ocular migraine     no HA  . Back pain     told cracked bone, vicodin didn't help, improved with percocets/muscle relaxants, no eval by prior PCP  . 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 DVT (deep venous thrombosis)     Diagnosed 02/13/12  - was placed on Warfarin after cardiac surgery but had skin reaction to warfarin so was changed to Pradaxa so DVT occurred while on Pradaxa although unclear if it occurred during transition  . Hypotension   . Atrial fibrillation     a. On pradaxa.  coumadin necrosis with warfarin;  b. Amio d/c'd 02/2012  . ICD (implantable cardiac defibrillator) in place    02/04/2012, [employer]  . Pericardial effusion   . Chronic combined systolic and diastolic CHF (congestive heart failure)     echo 03/19/12: severe asymmetric septal hypertrophy, evidence of upper septal myomectomy, peak LV outflow tract gradient 35, EF 35%, indeterminate diastolic function, no mitral valve SAM, hockey-stick appearance of the mitral valve, moderate MAC, trivial MR, probable mild to moderate mitral stenosis but no mean gradient measured across the mitral valve, moderate LAE, trivial pericardial effusion.   . Pericardial tamponade 03/14/2012  . Ventricular fibrillation 06/13/2012  . Skin necrosis --COUMADIN   . Chronic chest wall pain     eval by CVTS, stable, rec return to [facility] if continued pain    Past Surgical History  Procedure Date  . Cesarean section 1991  . Induced abortion 1990 and 1993  . US echocardiography 08/2011    severe LVH, severe asymmetric hypertrophy, EF 50-55%, grade 2 diastolic dysfunction  . Cardiac surgery 01/27/2012    median sternotomy, septal myectomy, MVR - [facility] (Dr. Silvestre Mesi)  . Cardiac defibrillator placement 01/2012  . Myomectomy   . Pericardial window 03/14/2012    Procedure: PERICARDIAL WINDOW;  Surgeon: Purcell Nails, MD;  Location: [facility] OR;  Service: Open Heart Surgery;  Laterality: N/A;  Subxyphoid Pericardial  Window     Family History  Problem Relation Age of Onset  . Diabetes Father   . Heart disease Father  unspecified heart problem  . Thyroid disease Mother   . Anemia Mother     mediterranean anemia, ITP  . Heart disease Maternal Grandfather     CHF  . Diabetes Paternal Grandfather   . Cancer Paternal Grandfather   . Coronary artery disease Neg Hx   . Stroke Neg Hx   . Sudden death Neg Hx     History  Substance Use Topics  . Smoking status: Former Smoker -- 1.0 packs/day for 3 years    Types: Cigarettes    Quit date: 09/30/1988  . Smokeless tobacco: Never Used   Comment: quitin 1990  . Alcohol Use: 0.0 oz/week      rarely drinks wine    OB History    Grav Para Term Preterm Abortions TAB SAB Ect Mult Living                  Review of Systems  Constitutional: Negative for activity change and appetite change.  HENT: Negative for neck stiffness.   Eyes: Negative for pain.  Respiratory: Negative for chest tightness and shortness of breath.   Cardiovascular: Positive for chest pain. Negative for leg swelling.  Gastrointestinal: Negative for nausea, vomiting, abdominal pain and diarrhea.  Genitourinary: Negative for flank pain.  Musculoskeletal: Positive for back pain.  Skin: Negative for rash.  Neurological: Positive for dizziness, syncope and headaches. Negative for weakness and numbness.  Psychiatric/Behavioral: Negative for behavioral problems.    Allergies  Warfarin and related  Home Medications   Current Outpatient Rx  Name Route Sig Dispense Refill  . ACETAMINOPHEN 500 MG PO TABS Oral Take 500 mg by mouth every 6 (six) hours as needed. For pain    . ALPRAZOLAM 0.5 MG PO TABS Oral Take 0.5 mg by mouth at bedtime as needed. For anxiety    . ASPIRIN BUFFERED 325 MG PO TABS Oral Take 325 mg by mouth daily.    . BECLOMETHASONE DIPROPIONATE 80 MCG/ACT IN AERS Inhalation Inhale 1 puff into the lungs 2 (two) times daily.    Marland Kitchen CETIRIZINE HCL 10 MG PO TABS Oral Take 10 mg by mouth daily.      [employer] DABIGATRAN ETEXILATE MESYLATE 150 MG PO CAPS Oral Take 1 capsule (150 mg total) by mouth every 12 (twelve) hours. 60 capsule 6  . DOFETILIDE 500 MCG PO CAPS Oral Take 1 capsule (500 mcg total) by mouth every 12 (twelve) hours. 60 capsule 11  . FUROSEMIDE 20 MG PO TABS Oral Take 1 tablet (20 mg total) by mouth daily. 30 tablet 3  . METOPROLOL SUCCINATE ER 25 MG PO TB24 Oral Take 1 tablet (25 mg total) by mouth daily. 30 tablet 11    BP 111/53  Pulse 81  Temp 97.8 F (36.6 C) (Oral)  Resp 15  SpO2 98%  LMP 07/08/2012  Physical Exam  Nursing note and vitals reviewed. Constitutional: She is  oriented to person, place, and time. She appears well-developed and well-nourished.  HENT:  Head: Normocephalic.       Mild tenderness to the occipital area. No step-off or deformity.  Eyes: EOM are normal. Pupils are equal, round, and reactive to light.  Neck: Normal range of motion. Neck supple.  Cardiovascular: Normal rate, regular rhythm and normal heart sounds.   No murmur heard. Pulmonary/Chest: Effort normal and breath sounds normal. No respiratory distress. She has no wheezes. She has no rales. She exhibits tenderness.       Tenderness over left chest AICD.  Abdominal: Soft. Bowel  sounds are normal. She exhibits no distension. There is no tenderness. There is no rebound and no guarding.  Musculoskeletal: Normal range of motion.       Mild lumbar tenderness. No crepitance or deformity.  Neurological: She is alert and oriented to person, place, and time. No cranial nerve deficit.  Skin: Skin is warm and dry.  Psychiatric: She has a normal mood and affect. Her speech is normal.    ED Course  Procedures (including critical care time)  Labs Reviewed  APTT - Abnormal; Notable for the following:    aPTT 43 (*)     All other components within normal limits  CBC WITH DIFFERENTIAL - Abnormal; Notable for the following:    RBC 5.15 (*)     MCV 77.5 (*)     MCH 24.1 (*)     RDW 17.1 (*)     Eosinophils Relative 7 (*)     All other components within normal limits  POCT I-STAT, CHEM 8 - Abnormal; Notable for the following:    Glucose, Bld 106 (*)     Calcium, Ion 1.08 (*)     All other components within normal limits  PROTIME-INR  MAGNESIUM  POCT I-STAT TROPONIN I   Ct Head Wo Contrast  07/22/2012  *RADIOLOGY REPORT*  Clinical Data: Larey Seat and hit back of head.  CT HEAD WITHOUT CONTRAST  Technique:  Contiguous axial images were obtained from the base of the skull through the vertex without contrast.  Comparison: None.  Findings: There is no evidence of acute intracranial hemorrhage,  infarction, subdural hematoma or shift to the midline structures. The brain, ventricles and extra-axial spaces are within normal limits. The bony calvarium is intact.  Skull base is normal.  The paranasal sinuses and mastoid air cells are clear.  IMPRESSION: No evidence of acute trauma.  Normal CT of the brain.   Original Report Authenticated By: Mervin Hack, M.D.    Dg Chest Port 1 View  07/22/2012  *RADIOLOGY REPORT*  Clinical Data: The hip, syncope.  PORTABLE CHEST - 1 VIEW  Comparison: 06/26/2012 CT  Findings: Cardiomegaly.  Elevated left hemidiaphragm.  No overt edema or focal consolidation.  No pneumothorax.  Small left pleural effusion not excluded.  Left chest wall battery pack with unchanged lead tip positions.  Status post median sternotomy.  No acute osseous finding.  IMPRESSION: Cardiomegaly, postoperative changes, elevated left hemidiaphragm. Similar to prior.  No acute process identified.   Original Report Authenticated By: Waneta Martins, M.D.      1. Ventricular fibrillation     CRITICAL CARE Performed by: Billee Cashing   Total critical care time: 30  Critical care time was exclusive of separately billable procedures and treating other patients.  Critical care was necessary to treat or prevent imminent or life-threatening deterioration.  Critical care was time spent personally by me on the following activities: development of treatment plan with patient and/or surrogate as well as nursing, discussions with consultants, evaluation of patient's response to treatment, examination of patient, obtaining history from patient or surrogate, ordering and performing treatments and interventions, ordering and review of laboratory studies, ordering and review of radiographic studies, pulse oximetry and re-evaluation of patient's condition.   Date: 07/22/2012  Rate: 82  Rhythm: normal sinus rhythm and premature ventricular contractions (PVC)  QRS Axis: left  Intervals:  QT prolonged  ST/T Wave abnormalities: normal  Conduction Disutrbances:left bundle branch block  Narrative Interpretation:   Old EKG Reviewed: unchanged   MDM  Patient with episode of syncope. AICD was interrogated he was found to be ventricular fibrillation. She's also had previous episodes of it today. She is on dofetilide but her QT is prolonged. She's previously not tolerated amiodarone due to nausea since vomiting. While in ER she had another episode of syncope unlikely A. fib. It self terminated. She's was loaded on amiodarone on her cardiologist guidance. She'll be admitted to step down unit.        Juliet Rude. Rubin Payor, MD 07/22/12 2332

## 2012-07-23 DIAGNOSIS — I4901 Ventricular fibrillation: Principal | ICD-10-CM

## 2012-07-23 DIAGNOSIS — I4891 Unspecified atrial fibrillation: Secondary | ICD-10-CM | POA: Diagnosis not present

## 2012-07-23 LAB — BASIC METABOLIC PANEL
GFR calc Af Amer: >90 mL/min (ref >90)
GFR calc non Af Amer: >90 mL/min (ref >90)
Potassium: 4.1 mEq/L (ref 3.5–5.1)
Sodium: 138 mEq/L (ref 135–145)

## 2012-07-23 LAB — CBC
Hemoglobin: 10.7 g/dL — ABNORMAL LOW (ref 12.0–15.0)
MCHC: 30.7 g/dL (ref 30.0–36.0)
Platelets: 231 10*3/uL (ref 150–400)
RBC: 4.43 MIL/uL (ref 3.87–5.11)

## 2012-07-23 LAB — TROPONIN I: Troponin I: <0.3 ng/mL (ref <0.30)

## 2012-07-23 LAB — MRSA PCR SCREENING: MRSA by PCR: NEGATIVE

## 2012-07-23 MED ORDER — FUROSEMIDE 20 MG PO TABS
20.0000 mg | ORAL_TABLET | Freq: Every day | ORAL | Status: DC
  Administered 2012-07-23 – 2012-07-27 (×5): 20 mg via ORAL
  Filled 2012-07-23 (×5): qty 1

## 2012-07-23 MED ORDER — ONDANSETRON HCL 4 MG/2ML IJ SOLN
4.0000 mg | Freq: Four times a day (QID) | INTRAMUSCULAR | Status: DC | PRN
  Administered 2012-07-23 – 2012-07-24 (×5): 4 mg via INTRAVENOUS
  Filled 2012-07-23 (×5): qty 2

## 2012-07-23 MED ORDER — SODIUM CHLORIDE 0.9 % IV SOLN: 250.0000 mL | INTRAVENOUS | Status: DC | PRN

## 2012-07-23 MED ORDER — SODIUM CHLORIDE 0.9 % IJ SOLN
3.0000 mL | Freq: Two times a day (BID) | INTRAMUSCULAR | Status: DC
  Administered 2012-07-23 – 2012-07-24 (×2): 3 mL via INTRAVENOUS
  Administered 2012-07-24: 17:00:00 via INTRAVENOUS
  Administered 2012-07-25 – 2012-07-27 (×3): 3 mL via INTRAVENOUS

## 2012-07-23 MED ORDER — ASPIRIN BUFFERED 325 MG PO TABS: 325.0000 mg | ORAL_TABLET | Freq: Every day | ORAL | Status: DC

## 2012-07-23 MED ORDER — ONDANSETRON HCL 4 MG/2ML IJ SOLN
4.0000 mg | Freq: Once | INTRAMUSCULAR | Status: AC
  Administered 2012-07-23: 4 mg via INTRAVENOUS

## 2012-07-23 MED ORDER — PROCHLORPERAZINE EDISYLATE 5 MG/ML IJ SOLN
10.0000 mg | Freq: Four times a day (QID) | INTRAMUSCULAR | Status: DC | PRN
  Administered 2012-07-23 – 2012-07-24 (×3): 10 mg via INTRAVENOUS
  Filled 2012-07-23 (×3): qty 2

## 2012-07-23 MED ORDER — SODIUM CHLORIDE 0.9 % IJ SOLN: 3.0000 mL | INTRAMUSCULAR | Status: DC | PRN

## 2012-07-23 MED ORDER — SODIUM CHLORIDE 0.9 % IJ SOLN
3.0000 mL | Freq: Two times a day (BID) | INTRAMUSCULAR | Status: DC
  Administered 2012-07-23 (×2): 3 mL via INTRAVENOUS
  Administered 2012-07-24: 17:00:00 via INTRAVENOUS
  Administered 2012-07-25: 3 mL via INTRAVENOUS

## 2012-07-23 MED ORDER — ALPRAZOLAM 0.5 MG PO TABS
0.5000 mg | ORAL_TABLET | Freq: Every evening | ORAL | Status: DC | PRN
  Administered 2012-07-23: 0.5 mg via ORAL
  Filled 2012-07-23 (×2): qty 1

## 2012-07-23 MED ORDER — ONDANSETRON HCL 4 MG/2ML IJ SOLN
INTRAMUSCULAR | Status: AC
  Administered 2012-07-23: 4 mg via INTRAVENOUS
  Filled 2012-07-23: qty 2

## 2012-07-23 MED ORDER — ASPIRIN EC 325 MG PO TBEC
325.0000 mg | DELAYED_RELEASE_TABLET | Freq: Every day | ORAL | Status: DC
  Administered 2012-07-23 – 2012-07-26 (×4): 325 mg via ORAL
  Filled 2012-07-23 (×5): qty 1

## 2012-07-23 MED ORDER — METOPROLOL SUCCINATE ER 25 MG PO TB24
25.0000 mg | ORAL_TABLET | Freq: Every day | ORAL | Status: DC
  Administered 2012-07-24: 25 mg via ORAL
  Filled 2012-07-23 (×2): qty 1

## 2012-07-23 MED ORDER — HYDROCODONE-ACETAMINOPHEN 5-325 MG PO TABS
2.0000 | ORAL_TABLET | ORAL | Status: DC | PRN
  Administered 2012-07-23 – 2012-07-27 (×7): 2 via ORAL
  Filled 2012-07-23 (×7): qty 2

## 2012-07-23 MED ORDER — FLUTICASONE PROPIONATE HFA 44 MCG/ACT IN AERO
1.0000 | INHALATION_SPRAY | Freq: Two times a day (BID) | RESPIRATORY_TRACT | Status: DC
  Administered 2012-07-23 – 2012-07-27 (×8): 1 via RESPIRATORY_TRACT
  Filled 2012-07-23: qty 10.6

## 2012-07-23 MED ORDER — ALPRAZOLAM 0.25 MG PO TABS
0.5000 mg | ORAL_TABLET | Freq: Three times a day (TID) | ORAL | Status: DC | PRN
  Administered 2012-07-23: 0.5 mg via ORAL
  Administered 2012-07-27: 0.25 mg via ORAL
  Filled 2012-07-23: qty 2

## 2012-07-23 MED ORDER — POTASSIUM CHLORIDE 10 MEQ/100ML IV SOLN
INTRAVENOUS | Status: AC
  Administered 2012-07-23: 10 meq via INTRAVENOUS
  Filled 2012-07-23: qty 200

## 2012-07-23 MED ORDER — PROMETHAZINE HCL 25 MG/ML IJ SOLN
6.2500 mg | Freq: Four times a day (QID) | INTRAMUSCULAR | Status: DC | PRN
  Filled 2012-07-23: qty 1

## 2012-07-23 MED ORDER — DABIGATRAN ETEXILATE MESYLATE 150 MG PO CAPS: 150.0000 mg | ORAL_CAPSULE | Freq: Two times a day (BID) | ORAL | Status: DC

## 2012-07-23 MED ORDER — POTASSIUM CHLORIDE 10 MEQ/100ML IV SOLN
10.0000 meq | INTRAVENOUS | Status: AC
  Administered 2012-07-23 (×2): 10 meq via INTRAVENOUS

## 2012-07-23 MED ORDER — POTASSIUM CHLORIDE 20 MEQ/15ML (10%) PO LIQD
20.0000 meq | Freq: Once | ORAL | Status: AC
  Administered 2012-07-23: 20 meq via ORAL
  Filled 2012-07-23: qty 15

## 2012-07-23 NOTE — Progress Notes (Signed)
ELECTROPHYSIOLOGY ROUNDING NOTE    Patient Name: [name] Date of Encounter: 07-23-2012    SUBJECTIVE: Patient feels nauseated.  Head hurting from where she fell last night during episode.  Requesting pain medication.   Admitted last night with syncope and VF terminated by ICD shock.  All VF episodes that we have available for review start with a similar PVC morphology.  She was last in the hospital September 13th with VF and ICD shocks and was initiated on Tikosyn.  She had previously been on Amiodarone but this was discontinued because of nausea.  Last night, Tikosyn was discontinued, and IV Amiodarone was initiated for suppression of arrhythmias.   TELEMETRY: Reviewed telemetry pt in sinus rhythm with multifocal PVC'S Filed Vitals:   07/22/12 2345 07/23/12 0015 07/23/12 0100 07/23/12 0424  BP: 103/52 113/100 108/52 87/52  Pulse: 86 71  66  Temp:   97.9 F (36.6 C) 97.8 F (36.6 C)  TempSrc:   Oral Oral  Resp:  23  20  Height:   5' (1.524 m)   Weight:   196 lb 6.9 oz (89.1 kg)   SpO2: 97% 100%  97%    Intake/Output Summary (Last 24 hours) at 07/23/12 0729 Last data filed at 07/23/12 0307  Gross per 24 hour  Intake      0 ml  Output    200 ml  Net   -200 ml    LABS: Basic Metabolic Panel:  Basename 07/23/12 0435 07/22/12 2204  NA 138 139  K 4.1 3.8  CL 106 101  CO2 24 --  GLUCOSE 134* 106*  BUN 13 17  CREATININE 0.68 1.00  CALCIUM 8.4 --  MG -- 2.0  PHOS -- --   CBC:  Basename 07/23/12 0435 07/22/12 2204  WBC 6.6 7.5  NEUTROABS -- 4.6  HGB 10.7* 12.414.3  HCT 34.8* 39.942.0  MCV 78.6 77.5*  PLT 231 271   Cardiac Enzymes:  Basename 07/23/12 0435  CKTOTAL --  CKMB --  CKMBINDEX --  TROPONINI <0.30    Radiology/Studies:  Ct Head Wo Contrast 07/22/2012  *RADIOLOGY REPORT*  Clinical Data: Larey Seat and hit back of head.  CT HEAD WITHOUT CONTRAST  Technique:  Contiguous axial images were obtained from the base of the skull through the vertex  without contrast.  Comparison: None.  Findings: There is no evidence of acute intracranial hemorrhage, infarction, subdural hematoma or shift to the midline structures. The brain, ventricles and extra-axial spaces are within normal limits. The bony calvarium is intact.  Skull base is normal.  The paranasal sinuses and mastoid air cells are clear.  IMPRESSION: No evidence of acute trauma.  Normal CT of the brain.   Original Report Authenticated By: Mervin Hack, M.D.    Dg Chest Port 1 View 07/22/2012  *RADIOLOGY REPORT*  Clinical Data: The hip, syncope.  PORTABLE CHEST - 1 VIEW  Comparison: 06/26/2012 CT  Findings: Cardiomegaly.  Elevated left hemidiaphragm.  No overt edema or focal consolidation.  No pneumothorax.  Small left pleural effusion not excluded.  Left chest wall battery pack with unchanged lead tip positions.  Status post median sternotomy.  No acute osseous finding.  IMPRESSION: Cardiomegaly, postoperative changes, elevated left hemidiaphragm. Similar to prior.  No acute process identified.   Original Report Authenticated By: Waneta Martins, M.D.    Device Interrogation:  Device Manufacturer: BSX Model Number: [id number]  DOI: 02-04-2012 Implanting physician: [facility]  Device following physician: Chase Picket Time: 8.7 seconds  Estimated  Longevity: 10.5 years     RA Lead  RV Lead     Amplitude 6.39mV 12.39mV  Impedence [phone]  Threshold 0.6V @ 0.14msec 0.6V @ 0.27msec  HV impedence  46    Programmed parameters:  Brady parameters:  Mode: DDD Lower Rate: 50 Upper rate: 130  PAV: 220   SAV: 220  Tachy parameters:  VT1 zone: Rate: 170 Therapies: Monitor only     VF zone:   Rate: 190 Therapies: ATP/shocks     Episodes:  1 shock for VF that was PVC initiated, 478 NSVT episodes.  All episodes that were printed initiate with the same PVC  EP Attending  Patient seen and examined. She has had recurrent PMVT/VF and her QT was prolonged on Tikosyn. This drug is not working and may be  pro-arrhythmic based on the QT. Will continue IV amio today and then try Dronenderone. Mexilitine/Quinidine would be another option, and lastly PVC ablation, as episodes appear to initiate from the same PVC. The difficulty with this approach would be that in EP Lab, often PVC's will often be quiescent and are unable to be mapped.  Lewayne Bunting, M.D.

## 2012-07-23 NOTE — Progress Notes (Addendum)
Called dr. Marchelle Gearing with abg results.   pco2 61.0   PH 7.419 Orders to continue bi pap    Above note entered in error on wrong pt

## 2012-07-23 NOTE — Care Management Note (Signed)
    Page 1 of 1   07/23/2012     1:44:27 PM   CARE MANAGEMENT NOTE 07/23/2012  Patient:  [name]   Account Number:  [id number]  Date Initiated:  07/23/2012  Documentation initiated by:  Junius Creamer  Subjective/Objective Assessment:   adm w at fib     Action/Plan:   lives w husband, pcp dr Denton Meek   Anticipated DC Date:     Anticipated DC Plan:        DC Planning Services  CM consult      Choice offered to / List presented to:             Status of service:   Medicare Important Message given?   (If response is "NO", the following Medicare IM given date fields will be blank) Date Medicare IM given:   Date Additional Medicare IM given:    Discharge Disposition:    Per UR Regulation:  Reviewed for med. necessity/level of care/duration of stay  If discussed at Long Length of Stay Meetings, dates discussed:    Comments:  10/24 13:43p debbie Kadyn Chovan rn,bsn [phone]

## 2012-07-24 MED ORDER — DRONEDARONE HCL 400 MG PO TABS
400.0000 mg | ORAL_TABLET | Freq: Two times a day (BID) | ORAL | Status: DC
  Administered 2012-07-24 – 2012-07-27 (×7): 400 mg via ORAL
  Filled 2012-07-24 (×9): qty 1

## 2012-07-24 MED ORDER — METOPROLOL SUCCINATE ER 25 MG PO TB24
25.0000 mg | ORAL_TABLET | Freq: Every day | ORAL | Status: DC
  Administered 2012-07-25 – 2012-07-27 (×3): 25 mg via ORAL
  Filled 2012-07-24 (×3): qty 1

## 2012-07-24 NOTE — Progress Notes (Signed)
PHARMACIST - PHYSICIAN ORDER COMMUNICATION  CONCERNING: Dronedarone Scientist, physiological) Safe Administration Policy  RECOMMENDATION(s): [x]  Recommend QTc monitoring. [x] Recommend periodic liver function monitoring. [x]  Patient has a history of QTc >500. Please evaluate possibility of changing therapy. [x]  Patient has been identified with a history of heart failure. Please continue to evaluate        for symptoms of decompensation. []  Patient has been identified with a drug interaction: ______________________  []  Other:  ________________________________________________________ Your patient has been prescribed dronedarone (Multaq), an antiarrhythmic related to amiodarone. There are some concerns regarding the use and monitoring of this medication:  . Increased mortality in patients with advanced heart failure. Dronedarone is contraindicated in patients with Class IV heart failure and in patients with Class II-III heart failure who have been hospitalized in the past month for decompensation. Pharmacy will continue to monitor for signs of decompensated heart failure.  . Increased mortality in patients with permanent atrial fibrillation. Dronedarone is contraindicated in patients who will not or cannot be converted to normal sinus rhythm.  . Prolonged QTc interval, with a potential to put the patient at risk for torsades de pointes. Because of this, Dronedarone should not be used in patients whose QTc is ?500 msec or PR interval is >280 msec. Medications that block Dronedarone's metabolism or also prolong the QTc should be avoided in combination with Dronedarone:  []  Azole antifungals (ketoconazole, voriconazole, itraconazole, fluconazole)  []  Quinolone antibiotics (levofloxacin, ciprofloxacin, moxifloxacin)  []  Macrolide antibiotics (azithromycin, erythromycin, clarithromycin)  []  Miscellaneous antibiotics (sulfamethoxazole-trimethoprim)  []  SSRIs/SNRIs (sertraline, fluoxetine, venlafaxine,  citalopram, paroxetine)  []  Antipsychotics (haloperidol, quetiapine, ziprasidone, risperidone, olanzapine,         clozapine) []  Antiarrhythmics (dofetilide, flecainide, ibutilide, procainamide, propafenone, quinidine,      sotalol, disopyramide)  []  Tricyclic antidepressants (amitriptyline, clomipramine, doxepin, imipramine,                   nortriptyline, desipramine)  [x]  Antiemetics (ondansetron, prochlorperazine)  []  Pain relievers/migraine medications/muscle relaxers (methadone, sumatriptan,         cyclobenzaprine)  []  Miscellaneous (alfuzosin, amantadine, cyclosporine, diphenhydramine, lithium,               nicardipine, octreotide, ranolazine, ritonavir, solifenacin, tacrolimus, tamoxifen,                  tizanidine, trazodone)  Thank Bonita Quin,   Fredrik Rigger 2:24 PM 07/24/2012

## 2012-07-24 NOTE — Progress Notes (Signed)
   ELECTROPHYSIOLOGY ROUNDING NOTE    Patient Name: [name] Date of Encounter: 07-24-2012    SUBJECTIVE:Patient still nauseated.  Compazine more helpful than Zofran.  Pt still on IV Amiodarone  TELEMETRY: Reviewed telemetry pt in sinus rhythm with occasional PVC's, no sustained arrythmias Filed Vitals:   07/24/12 0500 07/24/12 0600 07/24/12 0800 07/24/12 0905  BP:  95/50 87/49   Pulse: 59 57 59   Temp:   97.9 F (36.6 C)   TempSrc:   Oral   Resp:      Height:      Weight:      SpO2: 97% 96% 95% 98%    Intake/Output Summary (Last 24 hours) at 07/24/12 0935 Last data filed at 07/24/12 0800  Gross per 24 hour  Intake 1635.42 ml  Output    800 ml  Net 835.42 ml    Physical Exam Well appearing middle aged woman, NAD HEENT: Unremarkable Neck:  No JVD, no thyromegally Lungs:  Clear with no wheezes, rales, or rhonchi HEART:  Regular rate rhythm, no murmurs, no rubs, no clicks Abd:  soft, positive bowel sounds, no organomegally, no rebound, no guarding Ext:  2 plus pulses, no edema, no cyanosis, no clubbing Skin:  No rashes no nodules Neuro:  CN II through XII intact, motor grossly intact   LABS: Basic Metabolic Panel:  Basename 07/23/12 0435 07/22/12 2204  NA 138 139  K 4.1 3.8  CL 106 101  CO2 24 --  GLUCOSE 134* 106*  BUN 13 17  CREATININE 0.68 1.00  CALCIUM 8.4 --  MG -- 2.0  PHOS -- --   CBC:  Basename 07/23/12 0435 07/22/12 2204  WBC 6.6 7.5  NEUTROABS -- 4.6  HGB 10.7* 12.414.3  HCT 34.8* 39.942.0  MCV 78.6 77.5*  PLT 231 271   Cardiac Enzymes:  Basename 07/23/12 0435  CKTOTAL --  CKMB --  CKMBINDEX --  TROPONINI <0.30    Assessment/Plan  1. VF 2. HCM, s/p myomectomy 3. Intolerance to amiodarone. Rec: will dc iV amio and start Dronenederone and plan for discharge in a.m.  [name].D.

## 2012-07-24 NOTE — Progress Notes (Signed)
Notified PA of 42 beat run of v-tach. Pt was nauseated and lightheaded during the event but spontaneously can out of the rhythm. Patient has an ICD. Vitals stable now and patient resting in the bed. No new orders given. Will continue to monitor.  Egan Berkheimer, Charlaine Dalton

## 2012-07-24 NOTE — Progress Notes (Signed)
Patient still nauseated and vomiting  with amiodarone infusion. Zofran given and patient back in bed. Md to stop amiodarone and place patient on a different medicine. Will continue to monitor.  Wilena Tyndall, Charlaine Dalton RN

## 2012-07-24 NOTE — Progress Notes (Signed)
Nurse notified us of 42 beats VT a/w lightheadedness, nausea. No syncope. Dr. Tenny Craw discussed with Dr. Ladona Ridgel - no change in plan for now, continue to observe. Alayssa Flinchum PA-C

## 2012-07-25 DIAGNOSIS — I4901 Ventricular fibrillation: Secondary | ICD-10-CM | POA: Diagnosis not present

## 2012-07-25 DIAGNOSIS — I4891 Unspecified atrial fibrillation: Secondary | ICD-10-CM | POA: Diagnosis not present

## 2012-07-25 LAB — BASIC METABOLIC PANEL
Chloride: 95 mEq/L — ABNORMAL LOW (ref 96–112)
Creatinine, Ser: 0.78 mg/dL (ref 0.50–1.10)
GFR calc Af Amer: >90 mL/min (ref >90)
GFR calc non Af Amer: >90 mL/min (ref >90)
Potassium: 3.8 mEq/L (ref 3.5–5.1)

## 2012-07-25 NOTE — Discharge Planning (Signed)
Pt fiance is asking to speak with EP MD r/t pt to be discharged home without home care.  Family said, "We need someone to watch her". Pt family concerned that she might "pass out again and choke on her tongue". Appreciate case management delivering information on sensing monitor for pt to wear (Life alert type). Pt family states that "we can't afford that" (the home monitor). Cardiology PA contacted and referred to EP MD. Suggested EP MD speak to concerns when rounding tomorrow. Family member requests MD to call him tomorrow Will continue to monitor and advise attending as needed.

## 2012-07-25 NOTE — Progress Notes (Signed)
Patient ID: [name], female   DOB: 1970-10-24, 41 y.o.   MRN: [id number] Subjective:  Anxious, but nausea improved  Objective:  Vital Signs in the last 24 hours: Temp:  [97.8 F (36.6 C)-99.2 F (37.3 C)] 99.2 F (37.3 C) (10/26 0755) Pulse Rate:  [52-71] 71  (10/26 0600) Resp:  [18-20] 18  (10/26 0755) BP: (65-116)/(35-62) 88/44 mmHg (10/26 0600) SpO2:  [90 %-98 %] 96 % (10/26 0600)  Intake/Output from previous day: 10/25 0701 - 10/26 0700 In: 263.5 [P.O.:120; I.V.:143.5] Out: 1950 [Urine:1950] Intake/Output from this shift:    Physical Exam: Well appearing NAD HEENT: Unremarkable Neck:  No JVD, no thyromegally Lungs:  Clear with no wheezes, rales, or rhonchi HEART:  Regular rate rhythm, no murmurs, no rubs, no clicks Abd:  soft, positive bowel sounds, no organomegally, no rebound, no guarding Ext:  2 plus pulses, no edema, no cyanosis, no clubbing Skin:  No rashes no nodules Neuro:  CN II through XII intact, motor grossly intact  Lab Results:  Basename 07/23/12 0435 07/22/12 2204  WBC 6.6 7.5  HGB 10.7* 12.414.3  PLT 231 271    Basename 07/23/12 0435 07/22/12 2204  NA 138 139  K 4.1 3.8  CL 106 101  CO2 24 --  GLUCOSE 134* 106*  BUN 13 17  CREATININE 0.68 1.00    Basename 07/23/12 0435  TROPONINI <0.30   Hepatic Function Panel No results found for this basename: PROT,ALBUMIN,AST,ALT,ALKPHOS,BILITOT,BILIDIR,IBILI in the last 72 hours No results found for this basename: CHOL in the last 72 hours No results found for this basename: PROTIME in the last 72 hours  Imaging: No results found.  Cardiac Studies: Tele - NSR with pvc's and one long run of non-sustained PMVT Assessment/Plan:  1. PMVT 2. S/p myomectomy for HCM 3. Atrial fib 4. Anxiety 5. Nausea on amiodarone Rec: Nausea is improved off of amiodarone. Will continue dronenderone for now. If she has recurrnent arrhythmias, would consider Norpace and Mexilitine. In background would be  consideration for PVC ablation to prevent initiating PVC. Prognosis is guarded and I have discussed this with the patient.  LOS: 3 days    Lewayne Bunting 07/25/2012, 8:11 AM

## 2012-07-26 NOTE — Progress Notes (Signed)
Patient ID: [name], female   DOB: 1971/06/17, 41 y.o.   MRN: [id number] Subjective:  Nausea resolved. No chest pain, sob, or palpitations.  Objective:  Vital Signs in the last 24 hours: Temp:  [98.1 F (36.7 C)-99 F (37.2 C)] 98.1 F (36.7 C) (10/27 0800) Pulse Rate:  [62-69] 62  (10/26 1549) Resp:  [16-18] 18  (10/27 0800) BP: (87-107)/(34-68) 104/62 mmHg (10/27 0637) SpO2:  [93 %-97 %] 95 % (10/27 0800)  Intake/Output from previous day: 10/26 0701 - 10/27 0700 In: 120 [P.O.:120] Out: 700 [Urine:700] Intake/Output from this shift:    Physical Exam: Well appearing NAD HEENT: Unremarkable Neck:  No JVD, no thyromegally Lungs:  Clear with no wheezes, rales, or rhonchi. HEART:  Regular rate rhythm, no murmurs, no rubs, no clicks Abd:  Flat, positive bowel sounds, no organomegally, no rebound, no guarding Ext:  2 plus pulses, no edema, no cyanosis, no clubbing Skin:  No rashes no nodules Neuro:  CN II through XII intact, motor grossly intact  Lab Results: No results found for this basename: WBC:2,HGB:2,PLT:2 in the last 72 hours  Basename 07/25/12 2251  NA 131*  K 3.8  CL 95*  CO2 27  GLUCOSE 99  BUN 17  CREATININE 0.78   No results found for this basename: TROPONINI:2,CK,MB:2 in the last 72 hours Hepatic Function Panel No results found for this basename: PROT,ALBUMIN,AST,ALT,ALKPHOS,BILITOT,BILIDIR,IBILI in the last 72 hours No results found for this basename: CHOL in the last 72 hours No results found for this basename: PROTIME in the last 72 hours  Imaging: No results found.  Cardiac Studies: Tele - NSR with PVC's. Assessment/Plan:  1. VF 2. HCM 3. Sinus bradycardia Rec: she has done well the last 24 hours on Multaq. She is bradycardic and I have increased the pacing rate on her device from 50 to 70 bpm. Will transfer to telemetry today and plan to discharge tomorrow if no significant arrhythmias.  LOS: 4 days    Gregg Taylor,M.D. 07/26/2012, 8:26  AM

## 2012-07-27 ENCOUNTER — Encounter (HOSPITAL_COMMUNITY): Payer: Self-pay | Admitting: Nurse Practitioner

## 2012-07-27 DIAGNOSIS — Z0271 Encounter for disability determination: Secondary | ICD-10-CM

## 2012-07-27 DIAGNOSIS — Z86718 Personal history of other venous thrombosis and embolism: Secondary | ICD-10-CM

## 2012-07-27 DIAGNOSIS — Z7901 Long term (current) use of anticoagulants: Secondary | ICD-10-CM

## 2012-07-27 DIAGNOSIS — R55 Syncope and collapse: Secondary | ICD-10-CM

## 2012-07-27 DIAGNOSIS — R110 Nausea: Secondary | ICD-10-CM

## 2012-07-27 DIAGNOSIS — I472 Ventricular tachycardia: Secondary | ICD-10-CM

## 2012-07-27 LAB — BASIC METABOLIC PANEL
CO2: 27 mEq/L (ref 19–32)
Calcium: 9.2 mg/dL (ref 8.4–10.5)
GFR calc Af Amer: >90 mL/min (ref >90)
Sodium: 134 mEq/L — ABNORMAL LOW (ref 135–145)

## 2012-07-27 MED ORDER — DRONEDARONE HCL 400 MG PO TABS: 400.0000 mg | ORAL_TABLET | Freq: Two times a day (BID) | ORAL | Status: DC

## 2012-07-27 NOTE — Progress Notes (Signed)
    Subjective:  Feels ok. No chest pain, dyspnea, or palps  Objective:  Vital Signs in the last 24 hours: Temp:  [97.5 F (36.4 C)-98.4 F (36.9 C)] 98.2 F (36.8 C) (10/28 0514) Pulse Rate:  [62-70] 70  (10/28 0514) Resp:  [18-20] 20  (10/28 0514) BP: (96-105)/(53-78) 96/53 mmHg (10/28 0514) SpO2:  [96 %-97 %] 96 % (10/28 0745) Weight:  [85 kg (187 lb 6.3 oz)-85.821 kg (189 lb 3.2 oz)] 85 kg (187 lb 6.3 oz) (10/28 0514)  Intake/Output from previous day: 10/27 0701 - 10/28 0700 In: 640 [P.O.:640] Out: 1425 [Urine:1425]  Physical Exam: Pt is alert and oriented, NAD HEENT: normal Neck: JVP - normal Lungs: CTA bilaterally CV: RRR with grade 2/6 systolic murmur at the LSB Abd: soft, NT, Positive BS, no hepatomegaly Ext: trace pretibial edema, distal pulses intact and equal Skin: warm/dry no rash  Lab Results: No results found for this basename: WBC:2,HGB:2,PLT:2 in the last 72 hours  Basename 07/27/12 07/25/12 2251  NA 134* 131*  K 3.7 3.8  CL 97 95*  CO2 27 27  GLUCOSE 90 99  BUN 17 17  CREATININE 0.83 0.78   No results found for this basename: TROPONINI:2,CK,MB:2 in the last 72 hours  Tele: sinus rhythm, no further PVC's or VT  Assessment/Plan:  1. VF s/p ICD shock 2. Hypertrophic CM  Rhythm has stabilized on Multaq - will discharge on 400 mg BID. SHe has limited resources will ask case manager to help. If no immediate assistance I can check if office has samples she could pick up at discharge. She is on pradaxa so will stop ASA. Otherwise continue current medical therapy and plan discharge today.  Tonny Bollman, M.D. 07/27/2012, 8:09 AM

## 2012-07-27 NOTE — Progress Notes (Signed)
Utilization Review Completed.  

## 2012-07-27 NOTE — Progress Notes (Signed)
I called and spoke with Medicaid representative @ [phone], re: pre-authorization for Multaq.  Because she has only failed/didn't tolerate one of the preferred meds (amio; tikosyn is not a preferred med) in the past, her case needs to be reviewed by the pharmacist.  They are expediting her review but still it may take up to 24 hrs.  Her case # for this issue is: [id number].  Pt will remain in the hospital to receive Multaq until we have proper authorization.  We unfortunately do not have samples at our office.  Case discussed with Case Mgmt.

## 2012-07-27 NOTE — Discharge Summary (Signed)
Patient ID: [name],  MRN: [id number], DOB/AGE: 05-05-1971 41 y.o.  Admit date: 07/22/2012 Discharge date: 07/27/2012  Primary Care Provider: Eustaquio Boyden Primary Cardiologist: Judie Petit. Excell Seltzer, MD/G. Ladona Ridgel, MD  Discharge Diagnoses Principal Problem:  *Syncope Active Problems:  Ventricular fibrillation  VT (ventricular tachycardia)  Chronic combined systolic and diastolic CHF (congestive heart failure)  ANXIETY  ICD -[employer]  Nausea & vomiting  History of DVT (deep vein thrombosis)  Chronic anticoagulation  Allergies Allergies  Allergen Reactions  . Phenergan (Promethazine Hcl) Itching  . Amiodarone Nausea And Vomiting    N/V on IV amio 06/2012  . Warfarin And Related Other (See Comments)    Scars on skin   Procedures  None  History of Present Illness  41 y/o female with the above complex problem list with h/o HOCM s/p myomectomy along with VT/VF, PAF, and ICD placement.  [name] was in her USOH until the evening of admission when she was washing dishes in her kitchen and experienced loss of consciousness and resultant fall.  She presented to the [facility] ED where her ICD was interrogated and revealed VT/VF with rates >190 with subsequent 41 J shock.  Interrogation also demonstrated another episode of self-terminating VT/VF earlier in the evening and several other episodes of anti-tachycardia pacing.  ECG in the ED revealed sinus rhythm with LBBB and QTc of 580.  She was admitted for further evaluation.    Hospital Course  Following admission, Tikosyn was discontinued given prolongation of QTc and it's failure to prevent VT, and pt was placed on IV amiodarone.  Unfortunately, [name] developed significant nausea and vomiting resulting in discontinuation of amiodarone in favor of oral Multaq.  On Multaq, she did have one 41 beat run of VT associated with presyncope but over subsequent days, she had no further VT/VF and n/v resolved.  She did develop bradycardia on  Multaq therapy and as a result her baseline pacing rate was raised from 50 to 70.  She was transferred out to the floor on 10/27 and is felt to be stable for discharge today.    Discharge Vitals Blood pressure 100/75, pulse 70, temperature 98.4 F (36.9 C), temperature source Oral, resp. rate 20, height 5' (1.524 m), weight 187 lb 6.3 oz (85 kg), last menstrual period 07/08/2012, SpO2 99.00%.  Filed Weights   07/23/12 0100 07/26/12 1038 07/27/12 0514  Weight: 196 lb 6.9 oz (89.1 kg) 189 lb 3.2 oz (85.821 kg) 187 lb 6.3 oz (85 kg)   Labs  CBC Lab Results  Component Value Date   WBC 6.6 07/23/2012   HGB 10.7* 07/23/2012   HCT 34.8* 07/23/2012   MCV 78.6 07/23/2012   PLT 231 07/23/2012   Basic Metabolic Panel  Basename 07/27/12 07/25/12 2251  NA 134* 131*  K 3.7 3.8  CL 97 95*  CO2 27 27  GLUCOSE 90 99  BUN 17 17  CREATININE 0.83 0.78  CALCIUM 9.2 8.9  MG -- --  PHOS -- --   Cardiac Enzymes Lab Results  Component Value Date   TROPONINI <0.30 07/23/2012    Disposition  Pt is being discharged home today in good condition.  Follow-up Plans & Appointments  Follow-up Information    Follow up with Karen Bollman, MD. On 08/12/2012. (12:00 PM)    Contact information:   [address] [place] [address] [phone]       Follow up with Karen Newcomer, PA. On 08/03/2012. (9:50 AM - Dr. Earmon Phoenix PA)  Contact information:   [address] [place] [address] [phone]        Discharge Medications    Medication List     As of 07/27/2012 12:25 PM    STOP taking these medications         aspirin 325 MG buffered tablet      dofetilide 500 MCG capsule   Commonly known as: TIKOSYN      TAKE these medications         acetaminophen 500 MG tablet   Commonly known as: TYLENOL   Take 500 mg by mouth every 6 (six) hours as needed. For pain      ALPRAZolam 0.5 MG tablet   Commonly known as: XANAX   Take 0.5 mg by mouth  at bedtime as needed. For anxiety      beclomethasone 80 MCG/ACT inhaler   Commonly known as: QVAR   Inhale 1 puff into the lungs 2 (two) times daily.      cetirizine 10 MG tablet   Commonly known as: ZYRTEC   Take 10 mg by mouth daily.      dabigatran 150 MG Caps   Commonly known as: PRADAXA   Take 1 capsule (150 mg total) by mouth every 12 (twelve) hours.      dronedarone 400 MG tablet   Commonly known as: MULTAQ   Take 1 tablet (400 mg total) by mouth 2 (two) times daily with a meal.      furosemide 20 MG tablet   Commonly known as: LASIX   Take 1 tablet (20 mg total) by mouth daily.      metoprolol succinate 25 MG 24 hr tablet   Commonly known as: TOPROL-XL   Take 1 tablet (25 mg total) by mouth daily.      Outstanding Labs/Studies  None  Duration of Discharge Encounter   Greater than 30 minutes including physician time.  Signed, Nicolasa Ducking NP 07/27/2012, 12:25 PM

## 2012-08-03 ENCOUNTER — Ambulatory Visit (INDEPENDENT_AMBULATORY_CARE_PROVIDER_SITE_OTHER): Payer: Medicaid Other | Admitting: Physician Assistant

## 2012-08-03 ENCOUNTER — Encounter: Payer: Self-pay | Admitting: Physician Assistant

## 2012-08-03 ENCOUNTER — Encounter: Payer: Self-pay | Admitting: Internal Medicine

## 2012-08-03 ENCOUNTER — Ambulatory Visit (INDEPENDENT_AMBULATORY_CARE_PROVIDER_SITE_OTHER): Payer: Medicaid Other | Admitting: *Deleted

## 2012-08-03 ENCOUNTER — Telehealth: Payer: Self-pay | Admitting: *Deleted

## 2012-08-03 VITALS — BP 98/65 | HR 73 | Ht 60.0 in | Wt 188.1 lb

## 2012-08-03 DIAGNOSIS — I4891 Unspecified atrial fibrillation: Secondary | ICD-10-CM

## 2012-08-03 DIAGNOSIS — I472 Ventricular tachycardia, unspecified: Secondary | ICD-10-CM

## 2012-08-03 DIAGNOSIS — I4901 Ventricular fibrillation: Secondary | ICD-10-CM

## 2012-08-03 DIAGNOSIS — I421 Obstructive hypertrophic cardiomyopathy: Secondary | ICD-10-CM

## 2012-08-03 DIAGNOSIS — R5383 Other fatigue: Secondary | ICD-10-CM

## 2012-08-03 DIAGNOSIS — R5381 Other malaise: Secondary | ICD-10-CM

## 2012-08-03 DIAGNOSIS — R0989 Other specified symptoms and signs involving the circulatory and respiratory systems: Secondary | ICD-10-CM

## 2012-08-03 LAB — MAGNESIUM: Magnesium: 1.9 mg/dL (ref 1.5–2.5)

## 2012-08-03 LAB — CBC WITH DIFFERENTIAL/PLATELET
Basophils Absolute: 0.1 10*3/uL (ref 0.0–0.1)
Eosinophils Absolute: 0.6 10*3/uL (ref 0.0–0.7)
Hemoglobin: 11.9 g/dL — ABNORMAL LOW (ref 12.0–15.0)
Lymphocytes Relative: 19.4 % (ref 12.0–46.0)
MCHC: 32.3 g/dL (ref 30.0–36.0)
Monocytes Relative: 13.5 % — ABNORMAL HIGH (ref 3.0–12.0)
Neutro Abs: 3.1 10*3/uL (ref 1.4–7.7)
Neutrophils Relative %: 55.4 % (ref 43.0–77.0)
RDW: 18.2 % — ABNORMAL HIGH (ref 11.5–14.6)

## 2012-08-03 LAB — BASIC METABOLIC PANEL
CO2: 29 mEq/L (ref 19–32)
Calcium: 8.7 mg/dL (ref 8.4–10.5)
Creatinine, Ser: 0.9 mg/dL (ref 0.4–1.2)
GFR: 75.96 mL/min (ref >60.00)
Glucose, Bld: 100 mg/dL — ABNORMAL HIGH (ref 70–99)
Sodium: 136 mEq/L (ref 135–145)

## 2012-08-03 LAB — ICD DEVICE OBSERVATION
DEV-0020ICD: NEGATIVE
DEVICE MODEL ICD: 102591

## 2012-08-03 MED ORDER — POTASSIUM CHLORIDE CRYS ER 20 MEQ PO TBCR: 20.0000 meq | EXTENDED_RELEASE_TABLET | ORAL | Status: DC

## 2012-08-03 MED ORDER — FUROSEMIDE 20 MG PO TABS: 20.0000 mg | ORAL_TABLET | ORAL | Status: DC

## 2012-08-03 NOTE — Progress Notes (Signed)
[address] [place], [place]  [address] Phone: [phone], Fax:  [phone]  Date:  08/03/2012   Name:  [name]   DOB:  Jan 17, 1971   MRN:  [id number]  PCP:  Evelene Croon, MD  Primary Cardiologist:  Dr. Tonny Bollman  Primary Electrophysiologist:  Dr. Lewayne Bunting    History of Present Illness: [name] is a 41 y.o. female who returns for followup after recent hospitalization for VT/VF treated with ICD shock.  She has a h/o HOCM. She underwent a septal myomectomy and mitral valve repair at [facility] in 12/2011. Postoperative course was c/b VT, A. Fib and left UE DVT. She had AICD implanted. She was placed on Coumadin and developed necrotic skin reaction that was transitioned over to Pradaxa. 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 She developed a hemorrhagic effusion. Her Pradaxa was discontinued. She eventually progressed to tamponade and ultimately underwent pericardial window with Dr. Cornelius Moras.   Follow up left upper extremity venous duplex was negative for DVT and therefore, she was kept her off of Pradaxa.   She was admitted in September with an episode of VF treated with ICD therapy. She was placed on Tikosyn at that time.  Echo 9/13: Severe LVH, EF 30-35%, anteroseptal and inferior HK, LVOT narrow, mild AI, mild to moderate mitral stenosis, severe LAE. She was readmitted 10/23-10/28 with recurrent VT/VF. She was treated with ICD shock. Interrogation of her device demonstrated self terminating VT/VF and several other episodes of ATP treatment. QTc was prolonged at 580 in the setting of left bundle branch block. Tikosyn was discontinued. She was placed on IV amiodarone but developed significant nausea and vomiting. She was therefore transitioned over to [facility]. She had some bradycardia and her baseline pacing rate was raised from 50 to 70.  She continues to feel weak. She feels lightheaded. She denies syncope. She denies  chest discomfort. Breathing is stable. She denies orthopnea or PND.   Labs (10/13):   K 3.7, creatinine 0.83, magnesium 2, Hgb 10.7  Wt Readings from Last 3 Encounters:  08/03/12 188 lb 1.9 oz (85.331 kg)  07/27/12 187 lb 6.3 oz (85 kg)  07/16/12 187 lb (84.823 kg)     Past Medical History  Diagnosis Date  . Hypertrophic cardiomyopathy     s/p septal myomectomy with implantable defibrillator 01/27/2012   . Anxiety state, unspecified   . Allergic rhinitis     to dogs  . Asthma   . History of anemia     during pregnancy  . History of chicken pox   . Generalized headaches   . Ocular migraine     no HA  . Back pain     told cracked bone, vicodin didn't help, improved with percocets/muscle relaxants, no eval by prior PCP  . 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 DVT (deep venous thrombosis)     Diagnosed 02/13/12  - was placed on Warfarin after cardiac surgery but had skin reaction to warfarin so was changed to Pradaxa so DVT occurred while on Pradaxa although unclear if it occurred during transition  . Hypotension   . Atrial fibrillation     a. On pradaxa.  coumadin necrosis with warfarin;  b. Amio d/c'd 02/2012  . ICD (implantable cardiac defibrillator) in place     02/04/2012, [employer]  . Pericardial effusion   . Chronic combined systolic and diastolic CHF (  congestive heart failure)     a. echo 03/19/12: severe asymmetric septal hypertrophy, evidence of upper septal myomectomy, peak LV outflow tract gradient 35, EF 35%,;  b.  Echo 9/13: Severe LVH, EF 30-35%, anteroseptal and inferior HK, LVOT narrow, mild AI, mild to moderate mitral stenosis, severe LAE.  Marland Kitchen Pericardial tamponade 03/14/2012  . 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 01/2012 s/p BSX ICD;  b. 06/2012 Multaq initiated due to recurrent syncope, VT/VF - had severe n/v with IV amio.  . Skin necrosis --COUMADIN   . Chronic chest wall pain     eval by CVTS,  stable, rec return to [facility] if continued pain  . Syncope     a. in setting of VT/VF.    Current Outpatient Prescriptions  Medication Sig Dispense Refill  . acetaminophen (TYLENOL) 500 MG tablet Take 500 mg by mouth every 6 (six) hours as needed. For pain      . ALPRAZolam (XANAX) 0.5 MG tablet Take 0.5 mg by mouth at bedtime as needed. For anxiety      . beclomethasone (QVAR) 80 MCG/ACT inhaler Inhale 1 puff into the lungs 2 (two) times daily.      . cetirizine (ZYRTEC) 10 MG tablet Take 10 mg by mouth daily.        . dabigatran (PRADAXA) 150 MG CAPS Take 1 capsule (150 mg total) by mouth every 12 (twelve) hours.  60 capsule  6  . dronedarone (MULTAQ) 400 MG tablet Take 1 tablet (400 mg total) by mouth 2 (two) times daily with a meal.  60 tablet  6  . furosemide (LASIX) 20 MG tablet Take 1 tablet (20 mg total) by mouth daily.  30 tablet  3  . metoprolol succinate (TOPROL XL) 25 MG 24 hr tablet Take 1 tablet (25 mg total) by mouth daily.  30 tablet  11    Allergies: Allergies  Allergen Reactions  . Phenergan (Promethazine Hcl) Itching  . Amiodarone Nausea And Vomiting    N/V on IV amio 06/2012  . Warfarin And Related Other (See Comments)    Scars on skin    Social History:  The patient  reports that she quit smoking about 23 years ago. Her smoking use included Cigarettes. She has a 3 pack-year smoking history. She has never used smokeless tobacco. She reports that she drinks alcohol. She reports that she does not use illicit drugs.   ROS:  Please see the history of present illness.   She denies fevers, chills, cough, melena, hematochezia.   All other systems reviewed and negative.   PHYSICAL EXAM: VS:  BP 98/65  Pulse 73  Ht 5' (1.524 m)  Wt 188 lb 1.9 oz (85.331 kg)  BMI 36.74 kg/m2  LMP 07/08/2012 Well nourished, well developed, in no acute distress HEENT: normal Neck: no JVD Cardiac:  normal S1, S2; RRR; 2/6 systolic murmur heard best along the left sternal border  Lungs:   clear to auscultation bilaterally, no wheezing, rhonchi or rales Abd: soft, nontender, no hepatomegaly Ext: no edema Skin: warm and dry Neuro:  CNs 2-12 intact, no focal abnormalities noted  EKG:  NSR, HR 73, LBBB, QTc 544       ASSESSMENT AND PLAN:  1.  Ventricular Tachycardia/Fibrillation:   The patient's device was interrogated today. She has had no further episodes since discharge from the hospital. She notes episodes of lightheadedness and fatigue. This may be multifactorial. Continue Multaq. Followup with Dr. Excell Seltzer as planned in 2 weeks.  Arrange followup with Dr. Ladona Ridgel in 3 months. Check a basic metabolic panel and magnesium level today.  2. Chronic Combined Systolic and Diastolic CHF:   As noted, she does have some weakness and lightheadedness. Her blood pressure does run on the low side. I will adjust her Lasix to every other day to see if this helps. She knows to watch her weights and her swelling carefully. If she has any changes, she should resume her regular dose of Lasix and contact us.  3. Hypertrophic Obstructive Cardiomyopathy:   She is status post myomectomy. She has had a very complicated course since then. Continue followup as noted.  4. Atrial Fibrillation:   She is maintaining sinus rhythm. She is back on Pradaxa. Check a CBC today to assess stability of her anemia given her recent history of fatigue and lightheadedness.   Signed, Tereso Newcomer, PA-C  10:16 AM 08/03/2012

## 2012-08-03 NOTE — Patient Instructions (Addendum)
Your physician recommends that you return for lab work in: TODAY  BMET, CBC W/DIFF, MAGNESIUM LEVEL  CHANGE LASIX TO TAKING EVERY OTHER DAY  KEEP APPT WITH DR. Excell Seltzer 08/12/12  SEE DR. Ladona Ridgel IN 3 MONTHS

## 2012-08-03 NOTE — Progress Notes (Signed)
icd interrogation only  

## 2012-08-03 NOTE — Telephone Encounter (Signed)
Message copied by Tarri Fuller on Mon Aug 03, 2012  5:55 PM ------      Message from: [place], [place] T      Created: Mon Aug 03, 2012  5:37 PM       K+ low normal      Start K+ 20 mEq daily (on days she take Lasix only)      Mg2+ ok      Hgb improved      Tereso Newcomer, PA-C  5:37 PM 08/03/2012

## 2012-08-03 NOTE — Telephone Encounter (Signed)
pt notified about lab results and to start K+ 20 qod w/lasix rx sent to [facility] [address]

## 2012-08-07 ENCOUNTER — Ambulatory Visit: Payer: 59 | Admitting: Cardiovascular Disease

## 2012-08-12 ENCOUNTER — Ambulatory Visit: Payer: Self-pay | Admitting: Cardiovascular Disease

## 2012-08-20 ENCOUNTER — Ambulatory Visit (INDEPENDENT_AMBULATORY_CARE_PROVIDER_SITE_OTHER): Payer: Medicaid Other | Admitting: *Deleted

## 2012-08-20 ENCOUNTER — Ambulatory Visit (INDEPENDENT_AMBULATORY_CARE_PROVIDER_SITE_OTHER): Payer: Medicaid Other | Admitting: Cardiovascular Disease

## 2012-08-20 ENCOUNTER — Encounter: Payer: Self-pay | Admitting: Cardiovascular Disease

## 2012-08-20 VITALS — BP 98/60 | HR 71 | Ht 60.0 in | Wt 193.0 lb

## 2012-08-20 DIAGNOSIS — I4901 Ventricular fibrillation: Secondary | ICD-10-CM

## 2012-08-20 DIAGNOSIS — I472 Ventricular tachycardia: Secondary | ICD-10-CM

## 2012-08-20 NOTE — Patient Instructions (Signed)
Your physician wants you to follow-up in: April 2014 with Dr Excell Seltzer. You will receive a reminder letter in the mail two months in advance. If you don't receive a letter, please call our office to schedule the follow-up appointment.  Your physician recommends that you continue on your current medications as directed. Please refer to the Current Medication list given to you today.

## 2012-08-20 NOTE — Progress Notes (Signed)
HPI:  41 year old woman presenting for followup evaluation. She has a complicated history with a history of hypertrophic cardiomyopathy. She underwent septal myectomy and mitral valve repair in April 2013. Her postoperative course was complicated by ventricular tachycardia and atrial fibrillation. She also had a left upper extremity DVT. She developed a necrotic skin reaction from Coumadin and was transitioned to Pradaxa. Unfortunately she has had recurrent VT/VF treated with ICD shocks and has been rehospitalized in September and October. She had QT prolongation with Tikosyn and was intolerant to amiodarone because of nausea and vomiting. She has now been transitioned to [employer].   She continues to have exertional shortness of breath, currently with New York Heart Association class II symptoms. She denies palpitations. About 2 weeks ago she had episodes of near-syncope just like her previous episodes which have led to ICD shocks, but she did not completely lose consciousness and her ICD did not shocker again. She's had mild leg swelling and has started taking Lasix every other day. She denies chest pain or pressure.  Outpatient Encounter Prescriptions as of 08/20/2012  Medication Sig Dispense Refill  . acetaminophen (TYLENOL) 500 MG tablet Take 500 mg by mouth every 6 (six) hours as needed. For pain      . ALPRAZolam (XANAX) 0.5 MG tablet Take 0.5 mg by mouth at bedtime as needed. For anxiety      . beclomethasone (QVAR) 80 MCG/ACT inhaler Inhale 1 puff into the lungs 2 (two) times daily.      . cetirizine (ZYRTEC) 10 MG tablet Take 10 mg by mouth daily.        . dabigatran (PRADAXA) 150 MG CAPS Take 1 capsule (150 mg total) by mouth every 12 (twelve) hours.  60 capsule  6  . dronedarone (MULTAQ) 400 MG tablet Take 1 tablet (400 mg total) by mouth 2 (two) times daily with a meal.  60 tablet  6  . furosemide (LASIX) 20 MG tablet Take 1 tablet (20 mg total) by mouth every other day.      . metoprolol  succinate (TOPROL XL) 25 MG 24 hr tablet Take 1 tablet (25 mg total) by mouth daily.  30 tablet  11  . Potassium Gluconate 550 MG TABS Take by mouth.      . vitamin E 400 UNIT capsule Take 400 Units by mouth daily.      . [DISCONTINUED] potassium chloride SA (K-DUR,KLOR-CON) 20 MEQ tablet Take 1 tablet (20 mEq total) by mouth every other day. Take with your lasix  30 tablet  11    Allergies  Allergen Reactions  . Phenergan (Promethazine Hcl) Itching  . Amiodarone Nausea And Vomiting    N/V on IV amio 06/2012  . Warfarin And Related Other (See Comments)    Scars on skin    Past Medical History  Diagnosis Date  . Hypertrophic cardiomyopathy     s/p septal myomectomy with implantable defibrillator 01/27/2012   . Anxiety state, unspecified   . Allergic rhinitis     to dogs  . Asthma   . History of anemia     during pregnancy  . History of chicken pox   . Generalized headaches   . Ocular migraine     no HA  . Back pain     told cracked bone, vicodin didn't help, improved with percocets/muscle relaxants, no eval by prior PCP  . 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  DVT (deep venous thrombosis)     Diagnosed 02/13/12  - was placed on Warfarin after cardiac surgery but had skin reaction to warfarin so was changed to Pradaxa so DVT occurred while on Pradaxa although unclear if it occurred during transition  . Hypotension   . Atrial fibrillation     a. On pradaxa.  coumadin necrosis with warfarin;  b. Amio d/c'd 02/2012  . ICD (implantable cardiac defibrillator) in place     02/04/2012, [employer]  . Pericardial effusion   . Chronic combined systolic and diastolic CHF (congestive heart failure)     a. echo 03/19/12: severe asymmetric septal hypertrophy, evidence of upper septal myomectomy, peak LV outflow tract gradient 35, EF 35%,;  b.  Echo 9/13: Severe LVH, EF 30-35%, anteroseptal and inferior HK, LVOT narrow, mild AI, mild to  moderate mitral stenosis, severe LAE.  Marland Kitchen Pericardial tamponade 03/14/2012  . 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 01/2012 s/p BSX ICD;  b. 06/2012 Multaq initiated due to recurrent syncope, VT/VF - had severe n/v with IV amio.  . Skin necrosis --COUMADIN   . Chronic chest wall pain     eval by CVTS, stable, rec return to [facility] if continued pain  . Syncope     a. in setting of VT/VF.    ROS: Negative except as per HPI  BP 98/60  Pulse 71  Ht 5' (1.524 m)  Wt 87.544 kg (193 lb)  BMI 37.69 kg/m2  SpO2 97%  LMP 07/08/2012  PHYSICAL EXAM: Pt is alert and oriented, NAD HEENT: normal Neck: JVP - normal, carotids 2+= without bruits Lungs: CTA bilaterally CV: RRR with a grade 2/6 systolic murmur at the left sternal border Abd: soft, NT, Positive BS, no hepatomegaly Ext: Trace pretibial edema bilaterally, distal pulses intact and equal Skin: warm/dry no rash  ASSESSMENT AND PLAN: 1. Hypertrophic cardiomyopathy with severe systolic and diastolic dysfunction. Her left ventricular ejection fraction is 30-35%. She will continue her current medical program which includes only a low dose of metoprolol succinate and furosemide. We are limited by her low blood pressure. I would like to see her back in April. She hasn't interval appointment with Dr. Ladona Ridgel in February.  2. Paroxysmal atrial fibrillation. She is anticoagulated on pradaxa.  3. VT/VF. History is concerning for recurrent VT, but she did not receive an ICD shock. Will interrogate her device again. She remains on Multaq. She was unable to tolerate Tikosyn or amiodarone.  4. Work status - She has severe cardiac disease with persistent symptoms. She's been hospitalized multiple times in the past 6 months with problems related to congestive heart failure and recurrent VT/VF. I do not think she is going to be able to maintain employment because of her underlying cardiac disease. She has previously worked in [employer]. I have  some paperwork from an attorney working on her disability claim and I will complete this.  Tonny Bollman 08/20/2012 10:54 AM

## 2012-09-01 ENCOUNTER — Encounter: Payer: Self-pay | Admitting: Internal Medicine

## 2012-09-01 DIAGNOSIS — I428 Other cardiomyopathies: Secondary | ICD-10-CM

## 2012-09-01 LAB — ICD DEVICE OBSERVATION: DEV-0020ICD: NEGATIVE

## 2012-09-01 NOTE — Progress Notes (Signed)
Interrogation only

## 2012-09-11 ENCOUNTER — Telehealth: Payer: Self-pay | Admitting: Cardiovascular Disease

## 2012-09-11 NOTE — Telephone Encounter (Signed)
F/u   Patient returning nurse call

## 2012-09-11 NOTE — Telephone Encounter (Signed)
Left message to call back  

## 2012-09-11 NOTE — Telephone Encounter (Signed)
I spoke with the pt and she saw a new physician today and they did lab work and started her on Synthroid daily.  The pt wanted to know if this is okay to take with her other medications.  I spoke with Weston Brass Pharm-D and the pt is okay to start this medication. I did make the pt aware that it is very important to follow-up with the prescribing physician for repeat labs to monitor her thyroid.

## 2012-09-11 NOTE — Telephone Encounter (Signed)
plz return call to pt [phone] to discuss med interactions

## 2012-09-15 ENCOUNTER — Telehealth: Payer: Self-pay | Admitting: Cardiovascular Disease

## 2012-09-15 ENCOUNTER — Telehealth: Payer: Self-pay | Admitting: Internal Medicine

## 2012-09-15 IMAGING — CR DG CHEST 2V
2 series · 2 of 2 positions shown · non-contrast
Comparison: 03/19/2012; 03/17/2012; 03/16/2012

CLINICAL DATA: Post cardiac surgery

CHEST - 2 VIEW

[view not recorded (1 of 2)]
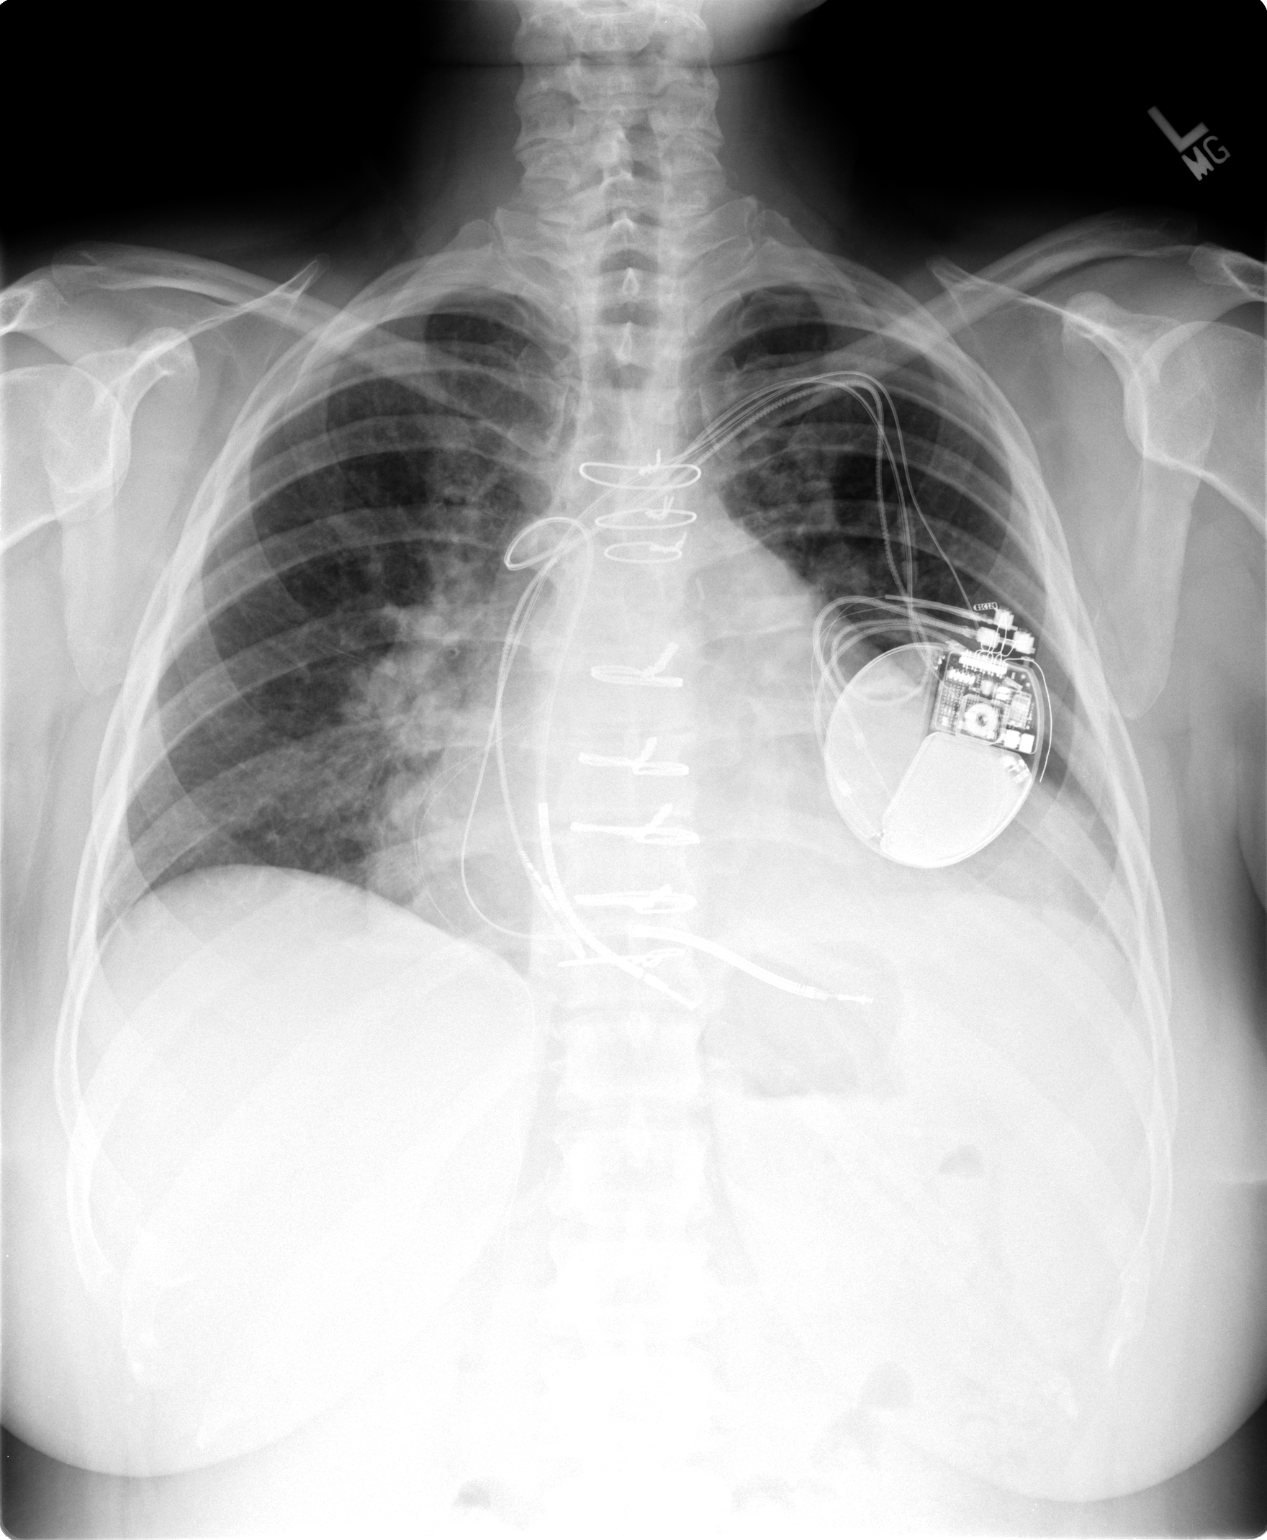

[view not recorded (2 of 2)]
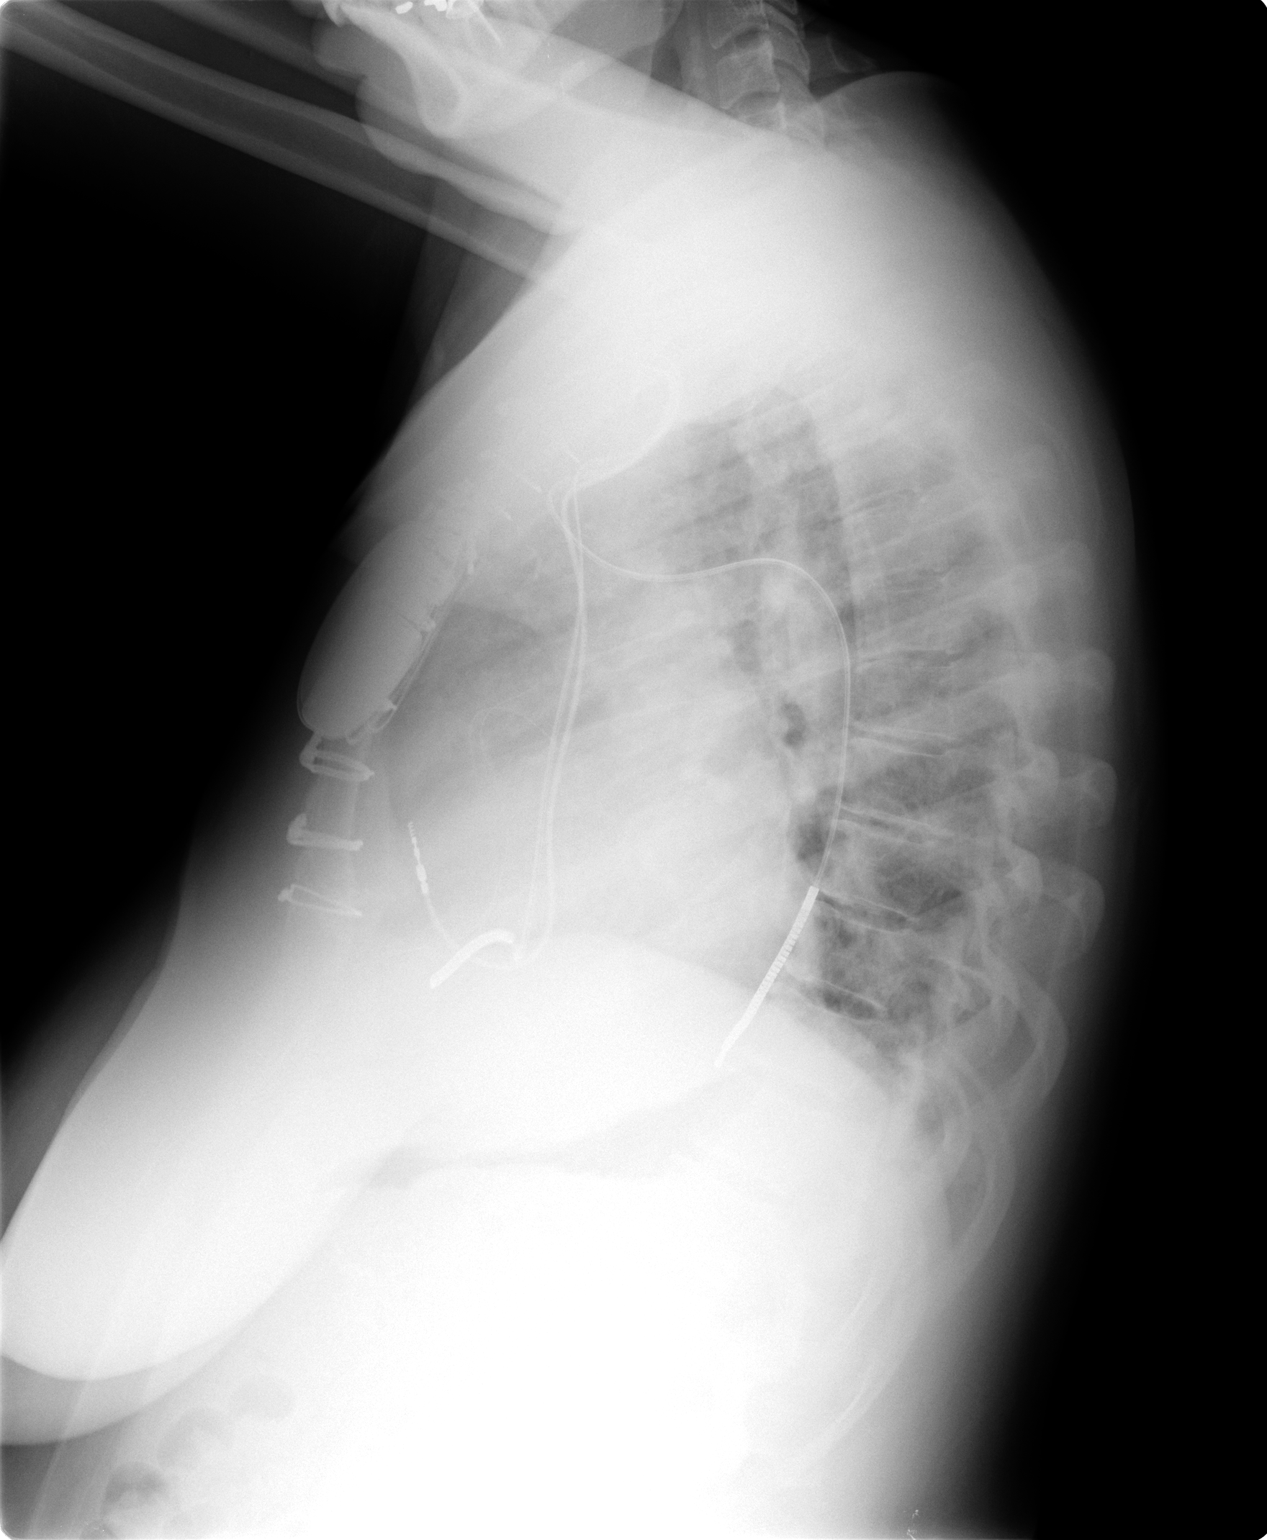

[2 of 2 positions shown; findings below may reference images not displayed]

FINDINGS: Grossly unchanged enlarged cardiac silhouette and mediastinal
contours.  Stable position of left anterior chest wall
AICD/pacemaker.  Interval removal of right subclavian vein approach
central venous catheter.  Interval removal of midline skin staples.
Grossly unchanged perihilar heterogeneous opacities.  Mild
pulmonary venous congestion without frank evidence of pulmonary
edema.  No definite pleural effusion or pneumothorax.  Unchanged
bones.
IMPRESSION: Pulmonary venous congestion without frank evidence of edema.

## 2012-09-15 NOTE — Telephone Encounter (Signed)
Patient calling to check on paperwork sent from [employer]. / tgs

## 2012-09-15 NOTE — Telephone Encounter (Signed)
plz return call to pt [phone] regarding disability documents. Pt has been informed by her disability provider that benefits will stop if documentation is not received. Plz return call to pt

## 2012-09-21 NOTE — Telephone Encounter (Signed)
Dr Excell Seltzer has this pt's paperwork for completion.

## 2012-09-28 NOTE — Telephone Encounter (Signed)
Pt. states that she is waiting on 2 different sets of paperwork from Dr. Excell Seltzer. One is for her insurance company and the other is for disability that pt. states needs to be faxed back by 10/06/12.  Pt. advised that message will be sent to Dr. Earmon Phoenix nurse, Leotis Shames.

## 2012-09-28 NOTE — Telephone Encounter (Signed)
Follow-up:     Patient is calling in inquiring about her disability paperwork that needs to be filled out and submitted before 10/06/12 or else she loses her benifits.  Please call back.

## 2012-10-01 ENCOUNTER — Other Ambulatory Visit: Payer: Self-pay

## 2012-10-01 DIAGNOSIS — I472 Ventricular tachycardia: Secondary | ICD-10-CM

## 2012-10-01 DIAGNOSIS — R5383 Other fatigue: Secondary | ICD-10-CM

## 2012-10-01 DIAGNOSIS — I421 Obstructive hypertrophic cardiomyopathy: Secondary | ICD-10-CM

## 2012-10-01 MED ORDER — FUROSEMIDE 20 MG PO TABS: 20.0000 mg | ORAL_TABLET | ORAL | Status: DC

## 2012-10-02 ENCOUNTER — Telehealth: Payer: Self-pay | Admitting: Internal Medicine

## 2012-10-02 DIAGNOSIS — I472 Ventricular tachycardia: Secondary | ICD-10-CM

## 2012-10-02 DIAGNOSIS — I421 Obstructive hypertrophic cardiomyopathy: Secondary | ICD-10-CM

## 2012-10-02 DIAGNOSIS — R5383 Other fatigue: Secondary | ICD-10-CM

## 2012-10-02 MED ORDER — FUROSEMIDE 20 MG PO TABS: 20.0000 mg | ORAL_TABLET | ORAL | Status: DC

## 2012-10-02 NOTE — Telephone Encounter (Signed)
New problem:   Lasix 20 mg.    [facility] [address] /  [id number].

## 2012-10-05 ENCOUNTER — Emergency Department (HOSPITAL_COMMUNITY): Payer: Medicaid Other

## 2012-10-05 ENCOUNTER — Encounter (HOSPITAL_COMMUNITY): Payer: Self-pay | Admitting: Cardiology

## 2012-10-05 ENCOUNTER — Emergency Department (HOSPITAL_COMMUNITY)
Admission: EM | Admit: 2012-10-05 | Discharge: 2012-10-05 | Disposition: A | Discharge status: Home / Self Care | Payer: Medicaid Other | Attending: Internal Medicine | Admitting: Internal Medicine

## 2012-10-05 DIAGNOSIS — I5042 Chronic combined systolic (congestive) and diastolic (congestive) heart failure: Secondary | ICD-10-CM | POA: Insufficient documentation

## 2012-10-05 DIAGNOSIS — Z872 Personal history of diseases of the skin and subcutaneous tissue: Secondary | ICD-10-CM | POA: Insufficient documentation

## 2012-10-05 DIAGNOSIS — R071 Chest pain on breathing: Secondary | ICD-10-CM | POA: Insufficient documentation

## 2012-10-05 DIAGNOSIS — F411 Generalized anxiety disorder: Secondary | ICD-10-CM | POA: Insufficient documentation

## 2012-10-05 DIAGNOSIS — Z862 Personal history of diseases of the blood and blood-forming organs and certain disorders involving the immune mechanism: Secondary | ICD-10-CM | POA: Insufficient documentation

## 2012-10-05 DIAGNOSIS — Z8679 Personal history of other diseases of the circulatory system: Secondary | ICD-10-CM | POA: Insufficient documentation

## 2012-10-05 DIAGNOSIS — M549 Dorsalgia, unspecified: Secondary | ICD-10-CM | POA: Insufficient documentation

## 2012-10-05 DIAGNOSIS — Z87891 Personal history of nicotine dependence: Secondary | ICD-10-CM | POA: Insufficient documentation

## 2012-10-05 DIAGNOSIS — Z8619 Personal history of other infectious and parasitic diseases: Secondary | ICD-10-CM | POA: Insufficient documentation

## 2012-10-05 DIAGNOSIS — G8929 Other chronic pain: Secondary | ICD-10-CM | POA: Insufficient documentation

## 2012-10-05 DIAGNOSIS — Z8719 Personal history of other diseases of the digestive system: Secondary | ICD-10-CM | POA: Insufficient documentation

## 2012-10-05 DIAGNOSIS — J45909 Unspecified asthma, uncomplicated: Secondary | ICD-10-CM | POA: Insufficient documentation

## 2012-10-05 DIAGNOSIS — I421 Obstructive hypertrophic cardiomyopathy: Secondary | ICD-10-CM

## 2012-10-05 DIAGNOSIS — I509 Heart failure, unspecified: Secondary | ICD-10-CM

## 2012-10-05 DIAGNOSIS — Z79899 Other long term (current) drug therapy: Secondary | ICD-10-CM | POA: Insufficient documentation

## 2012-10-05 DIAGNOSIS — I959 Hypotension, unspecified: Secondary | ICD-10-CM | POA: Insufficient documentation

## 2012-10-05 LAB — CBC
HCT: 41 % (ref 36.0–46.0)
MCHC: 31.2 g/dL (ref 30.0–36.0)
Platelets: 264 10*3/uL (ref 150–400)
RDW: 15.5 % (ref 11.5–15.5)

## 2012-10-05 LAB — BASIC METABOLIC PANEL
BUN: 11 mg/dL (ref 6–23)
Calcium: 9.5 mg/dL (ref 8.4–10.5)
GFR calc non Af Amer: >90 mL/min (ref >90)
Glucose, Bld: 99 mg/dL (ref 70–99)

## 2012-10-05 MED ORDER — VITAMIN K1 10 MG/ML IJ SOLN: 10.0000 mg | Freq: Once | INTRAMUSCULAR | Status: DC

## 2012-10-05 MED ORDER — TRAMADOL HCL 50 MG PO TABS: 50.0000 mg | ORAL_TABLET | Freq: Four times a day (QID) | ORAL | Status: DC | PRN

## 2012-10-05 MED ORDER — FENTANYL CITRATE 0.05 MG/ML IJ SOLN
50.0000 ug | INTRAMUSCULAR | Status: DC | PRN
  Administered 2012-10-05: 50 ug via INTRAVENOUS
  Filled 2012-10-05: qty 2

## 2012-10-05 MED ORDER — SODIUM CHLORIDE 0.9 % IV BOLUS (SEPSIS)
500.0000 mL | Freq: Once | INTRAVENOUS | Status: AC
  Administered 2012-10-05: 12:00:00 via INTRAVENOUS

## 2012-10-05 NOTE — ED Notes (Signed)
ECHO at bedsoide

## 2012-10-05 NOTE — ED Notes (Signed)
Patient transported to X-ray 

## 2012-10-05 NOTE — ED Notes (Signed)
Cardiology at bedside.

## 2012-10-05 NOTE — ED Notes (Signed)
Pt reports left sided back for the past couple of days. States hx of the same when she had to have fluid removed from her heart back in June 2013. Same pain feels the same. Denies any chest pain, or SOB. Tried tylenol at home without any relief.

## 2012-10-05 NOTE — Consult Note (Signed)
History and Physical   Patient ID: [name] MRN: [id number], DOB/AGE: June 26, 1971   Admit date: 10/05/2012 Date of Consult: 10/05/2012   Primary Physician: Evelene Croon, MD Primary Cardiologist: Tonny Bollman, MD  HPI: [name] is a 42yo female with a complex PMHx who presents to [facility] ED with back pain. 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 Post-op course was c/b VT and AF for which a Medtronic ICD was implanted and amiodarone started. She developed a LUE DVT, but developed skin necrosis on Coumadin. She was transitioned to Pradaxa. She presented back to [facility] hospital on 06/13 w/ postpericardiotomy syndrome and pericardial effusion which progressed to hemorrhagic cardiac tamponade (manifested as progrssive SOB) s/p window with resolution. Pradaxa was held. She presented again to [facility] on 05/2012 for syncope where device interrogation revealed VF with successful shocks and self-terminating VT. She was started on amiodarone, but did not tolerate this d/t nausea and vomiting. Tikosyn was added for a-fib. She presented back shortly after with syncope attributed to VT/VF > 190 bpm with successful ICD shock x 1 and several episodes of ATP. QTc was found to be prolonged, inducing the ventricular tachy-arrhythmias, and Tikosyn was replaced with Multaq. There was some bradycardia with this, but baseline pacing rate was increased from 50-70 bpm.   NYHA class II symptoms endorsed on 10/13 follow-up. She noted a few episodes of presyncope without ICD shocks. She had been continued on low dose metoprolol and Lasix for leg swelling, which are noted to be limited by hypotension. Pradaxa had been restarted. Of note, history does indicate prior back pain- no work up in the past, has been told she has a "cracked bone," CXR in 2012 does indicate thoracic degenerative changes.   Last device interrogation 09/01/12  revealed no correlation of presyncope to VT/VF, and that the patient was to be enrolled in the [employer] trial. Follow-up with Dr. Ladona Ridgel in 02/14. TTE 05/2012 revealed EF 30-35%, severe LVH/concentric hypertrophy, AK of entire anteroseptal and inferior myocardium, septal dyssynergy, narrow LVOT, mild AI, mild-mod MS, severe LA dilatation.   She reports left mid-lower back pain x 3 days unrelieved by Tylenol, aggravated by pressure and position changes. She sits upright in a recliner to sleep at night purely for comfort purposes (no orthopnea or chest pian in recumbency) and has recently increased her activity level by walking her large dog without limitation. No chest pain, shortness of breath, lightheadedness, syncope, ICD shocks, PND, orthopnea, palpitations. No fevers, chills, cough or urinary changes.  The pain worsened and she feared it might be her heart as the pain is in the same location as her cardiac tamponade/effusion, thus prompting her ED presentation.   There, EKG reveals NSR, LBBB- QRS 180 ms, LAE. Initial TnI WNL. CXR without acute abnormalities. BMET and CBC WNL. BP low at 91-108/48-86 improved with NS bolus. Fentanyl given for back pain.   Problem List: Past Medical History  Diagnosis Date  . Hypertrophic cardiomyopathy     s/p septal myomectomy with implantable defibrillator 01/27/2012   . Anxiety state, unspecified   . Allergic rhinitis     to dogs  . Asthma   . History of anemia     during pregnancy  . History of chicken pox   . Generalized headaches   . Ocular migraine     no HA  . Back pain     told cracked bone, vicodin didn't  help, improved with percocets/muscle relaxants, no eval by prior PCP  . 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 DVT (deep venous thrombosis)     Diagnosed 02/13/12  - was placed on Warfarin after cardiac surgery but had skin reaction to warfarin so was changed to Pradaxa so DVT occurred while  on Pradaxa although unclear if it occurred during transition  . Hypotension   . Atrial fibrillation     a. On pradaxa.  coumadin necrosis with warfarin;  b. Amio d/c'd 02/2012  . ICD (implantable cardiac defibrillator) in place     02/04/2012, [employer]  . Pericardial effusion   . Chronic combined systolic and diastolic CHF (congestive heart failure)     a. echo 03/19/12: severe asymmetric septal hypertrophy, evidence of upper septal myomectomy, peak LV outflow tract gradient 35, EF 35%,;  b.  Echo 9/13: Severe LVH, EF 30-35%, anteroseptal and inferior HK, LVOT narrow, mild AI, mild to moderate mitral stenosis, severe LAE.  Marland Kitchen Pericardial tamponade 03/14/2012  . 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 01/2012 s/p BSX ICD;  b. 06/2012 Multaq initiated due to recurrent syncope, VT/VF - had severe n/v with IV amio.  . Skin necrosis --COUMADIN   . Chronic chest wall pain     eval by CVTS, stable, rec return to [facility] if continued pain  . Syncope     a. in setting of VT/VF.    Past Surgical History  Procedure Date  . Cesarean section 1991  . Induced abortion 1990 and 1993  . US echocardiography 08/2011    severe LVH, severe asymmetric hypertrophy, EF 50-55%, grade 2 diastolic dysfunction  . Cardiac surgery 01/27/2012    median sternotomy, septal myectomy, MVR - [facility] (Dr. Silvestre Mesi)  . Cardiac defibrillator placement 01/2012  . Myomectomy   . Pericardial window 03/14/2012    Procedure: PERICARDIAL WINDOW;  Surgeon: Purcell Nails, MD;  Location: [facility] OR;  Service: Open Heart Surgery;  Laterality: N/A;  Subxyphoid Pericardial  Window      Allergies:  Allergies  Allergen Reactions  . Phenergan (Promethazine Hcl) Itching  . Amiodarone Nausea And Vomiting    N/V on IV amio 06/2012  . Warfarin And Related Other (See Comments)    Scars on skin    Home Medications: Prior to Admission medications   Medication Sig Start Date End Date Taking? Authorizing Provider  acetaminophen (TYLENOL) 500  MG tablet Take 1,000-1,500 mg by mouth every 6 (six) hours as needed. For pain   Yes Historical Provider, MD  ALPRAZolam Prudy Feeler) 0.5 MG tablet Take 0.5 mg by mouth at bedtime as needed. For anxiety/sleep   Yes Historical Provider, MD  beclomethasone (QVAR) 80 MCG/ACT inhaler Inhale 1 puff into the lungs 2 (two) times daily.   Yes Historical Provider, MD  cetirizine (ZYRTEC) 10 MG tablet Take 10 mg by mouth daily.     Yes Historical Provider, MD  dabigatran (PRADAXA) 150 MG CAPS Take 1 capsule (150 mg total) by mouth every 12 (twelve) hours. 06/17/12  Yes Tonny Bollman, MD  dronedarone (MULTAQ) 400 MG tablet Take 1 tablet (400 mg total) by mouth 2 (two) times daily with a meal. 07/27/12  Yes Ok Anis, NP  furosemide (LASIX) 20 MG tablet Take 20 mg by mouth daily. 10/02/12  Yes Tonny Bollman, MD  levothyroxine (SYNTHROID) 50 MCG tablet Take 50 mcg by mouth every evening.  09/11/12  Yes Tonny Bollman,  MD  metoprolol succinate (TOPROL XL) 25 MG 24 hr tablet Take 1 tablet (25 mg total) by mouth daily. 06/16/12  Yes Rhonda G Barrett, PA  Potassium Gluconate 550 MG TABS Take 1 tablet by mouth daily.    Yes Historical Provider, MD  vitamin E 400 UNIT capsule Take 400 Units by mouth daily.   Yes Historical Provider, MD    Inpatient Medications:     (Not in a hospital admission)  Family History  Problem Relation Age of Onset  . Diabetes Father   . Heart disease Father     unspecified heart problem  . Thyroid disease Mother   . Anemia Mother     mediterranean anemia, ITP  . Heart disease Maternal Grandfather     CHF  . Diabetes Paternal Grandfather   . Cancer Paternal Grandfather   . Coronary artery disease Neg Hx   . Stroke Neg Hx   . Sudden death Neg Hx      History   Social History  . Marital Status: Single    Spouse Name: N/A    Number of Children: 1  . Years of Education: N/A   Occupational History  . Naval architect    Social History Main Topics  . Smoking  status: Former Smoker -- 1.0 packs/day for 3 years    Types: Cigarettes    Quit date: 09/30/1988  . Smokeless tobacco: Never Used     Comment: quitin 1990  . Alcohol Use: 0.0 oz/week     Comment: rarely drinks wine  . Drug Use: No     Comment: quiti n 1989  . Sexually Active: Yes    Birth Control/ Protection: Pill   Other Topics Concern  . Not on file   Social History Narrative   Caffeine: 1 cup coffee/dayDivorced, 1 child.  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  Cardiovascular: negative for chest pain, dyspnea on exertion, edema, orthopnea, palpitations, paroxysmal nocturnal dyspnea or shortness of breath Dermatological: negative for rash Respiratory: negative for cough or wheezing Urologic: negative for hematuria Abdominal: negative for nausea, vomiting, diarrhea, bright red blood per rectum, melena, or hematemesis Neurologic: negative for visual changes, syncope, or dizziness, numbness, tingling, weakness or clumsiness Musculoskeletal: positive back pain All other systems reviewed and are otherwise negative except as noted above.  Physical Exam: Blood pressure 108/65, pulse 70, temperature 98.1 F (36.7 C), temperature source Oral, resp. rate 16, last menstrual period 09/23/2012, SpO2 99.00%.    General: Well developed, well nourished, in no acute distress. Head: Normocephalic, atraumatic, sclera non-icteric, no xanthomas, nares are without discharge.  Neck: Negative for carotid bruits. JVD not elevated. Lungs: Clear bilaterally to auscultation without wheezes, rales, or rhonchi. Breathing is unlabored. Heart: RRR, III-IV/VI systolic murmur at LUSB/LLSB, with S1 fixed split S2. No rub appreciated. Abdomen: Soft, non-tender, non-distended with normoactive bowel sounds. No  hepatomegaly. No rebound/guarding. No obvious abdominal masses. Msk:  Strength and tone appears normal for age. Left mid-lower back just lateral to spine tender to palpation, tenderness elicited on laying flat and active flexion/extension.  Extremities: No clubbing, cyanosis or edema.  Distal pedal pulses are 2+ and equal bilaterally. Neuro: Alert and oriented X 3. Moves all extremities spontaneously. Psych:  Responds to questions appropriately with a normal affect.  Labs: Recent Labs  Basename 10/05/12 1008   WBC 5.3  HGB 12.8   HCT 41.0   MCV 79.6   PLT 264    Lab 10/05/12 1008  NA 137  K 4.1  CL 102  CO2 25  BUN 11  CREATININE 0.75  CALCIUM 9.5  PROT --  BILITOT --  ALKPHOS --  ALT --  AST --  AMYLASE --  LIPASE --  GLUCOSE 99   Recent Labs  Basename 10/05/12 1027   CKTOTAL --   CKMB --   CKMBINDEX --   TROPONINI <0.30   Radiology/Studies: Dg Chest 2 View  10/05/2012  *RADIOLOGY REPORT*  Clinical Data: Back pain.  CHEST - 2 VIEW  Comparison: July 22, 2012.  Findings: Stable cardiomegaly.  Sternotomy wires are noted.  No change in position of left-sided pacemaker.  No acute pulmonary disease is noted.  Bony thorax is intact.  IMPRESSION: No acute cardiopulmonary abnormality seen.   Original Report Authenticated By: Lupita Raider.,  M.D.    EKG: NSR, 79 bpm, LBBB - QRS 180 ms, LAE, IVCD II, III, aVF  ASSESSMENT:   1. HOCM s/p myomectomy 2. Secondary MR s/p MV repair 3. Chronic combined CHF, NYHA class II  4. Back pain 5. PAF 6. Recurrent VT/VF 7. H/o pericardial effusion/cardiac tamponade s/p window  8. H/o LUE DVT 9. Hypotension 10. Anxiety   DISCUSSION/PLAN:  She reports a 3-day history of worsening mid-lower left back pain worse on position changes and palpation. She sleeps upright and has recently been able to walk her large dog without being functionally limited. She has been fairly stable from a cardiac perspective denying chest pain, SOB, DOE,  LE edema, PND, orthopnea, presyncope, syncope or palpitations with this. The back pain is in the same distribution as her prior cardiac effusion and tamponade thus prompting her presentation. There are no associated heart failure-type symptoms to correlate with her back pain or clinical signs to suggest tamponade. SBP is in 90s, however this is chronic. She appears euvolemic, there is no  rub, no JVD.  Back is quite tender to palpation and with position changes supporting more of a musculoskeletal etiology. Clinical suspicion for cardiac tamponade is low, however given her complicated course over the past 6 months, would favor bedside echo to rule out developing pericardial effusion/ensure window patency.R. Hurman Horn, PA-C 10/05/2012, 3:35 PM   Patient seen and examined.  Agree with findings stated above with by R Arguello. On exam: JVP is normal  LUngs are CTA  Chest Tender to palpation anterior and particularly posterior.  Patient in pain with change of position. CXR with cardiomegaly but no infiltrate  Heart size is not larger than on previous xrays.  I performed a bedside echo and there was no effusion noted.   Impression:  Back pain.  Etiology appears to be musculoskeletal.  I would treat with pain meds.  Rec Tramadol but patient says this does not work.  Would use very small Rx of Lortab for next few days  Rec rest and also rec she not walk her pit bull.  Clinic appt is already in schedule for 11/10/12 with Dr Ladona Ridgel.  Dietrich Pates 4:24 PM

## 2012-10-05 NOTE — ED Provider Notes (Signed)
Medical screening examination/treatment/procedure(s) were conducted as a shared visit with non-physician practitioner(s) and myself.  I personally evaluated the patient during the encounter  Patient with history of cardiac surgery at [facility] a few years ago complicated by some sort of pericardial effusion. She developed what sounds like cardiac And not requiring drainage surgery by Dr. Eliot Ford here about one year ago. Patient has developed nontraumatic gradually worsening back pain in the middle upper back about 3 days ago that she's been taking Tylenol for without any significant improvement. She denies any numbness or tingling or weakness of her extremities. She reports the pain is similar to the pain that she had prior to requiring surgery. She denies any chest pain, fever cough. She denies any skin rash. Due to the worsening pain and fear that this might be cardiac Not again, the patient presented here to the emergency department at the urging of her family member. They did not contact the patient's cardiologist. The patient is on pradaxa for her chronic atrial fibrillation and she also has a pacer and defibrillator in place.  IVF to support initial mild low BP which is improved, IV fentanyl for pain.    NP Okey Dupre spoke to [facility] cardiology who will see pt.  ECG shows no acute ischemia   Impression: Back pain, r/o cardiac tamponade  Karen Pound. [name], MD 10/05/12 1218

## 2012-10-05 NOTE — ED Provider Notes (Signed)
History     CSN: [id number]  Arrival date & time 10/05/12  0915   First MD Initiated Contact with Patient 10/05/12 [phone]      Chief Complaint  Patient presents with  . Back Pain    (Consider location/radiation/quality/duration/timing/severity/associated sxs/prior treatment) Patient is a 42 y.o. female presenting with back pain. The history is provided by the patient and a friend. No language interpreter was used.  Back Pain  This is a new problem. The current episode started more than 2 days ago. The problem occurs daily. The problem has been gradually worsening. The pain is associated with no known injury. The pain does not radiate. The pain is at a severity of 5/10. The pain is moderate. The pain is the same all the time. Pertinent negatives include no chest pain, no fever, no abdominal pain, no bowel incontinence, no perianal numbness, no bladder incontinence, no dysuria, no paresthesias and no paresis. She has tried analgesics for the symptoms.   42 year old female with 3 days of mid to left back pain. Past medical history of anemia, asthma, septal myectomy, V. tach, atrial fib, left upper extremity DVT, MVR repair. Patient states that the pain is similar to the pain she had back in June when she had to have a pericardial window for hemorrhagic effusion post septal myectomy. At that time she was diagnosed with post peri- Cardiotomy syndrome. Cardiologist is Dr. Excell Seltzer.  Dr. Cornelius Moras did her pericardial window.    Past Medical History  Diagnosis Date  . Hypertrophic cardiomyopathy     s/p septal myomectomy with implantable defibrillator 01/27/2012   . Anxiety state, unspecified   . Allergic rhinitis     to dogs  . Asthma   . History of anemia     during pregnancy  . History of chicken pox   . Generalized headaches   . Ocular migraine     no HA  . Back pain     told cracked bone, vicodin didn't help, improved with percocets/muscle relaxants, no eval by prior PCP  . S/P MVR (mitral  valve repair) 12/2011    dental ppx needed, rec anticoagulation for 3-6 mo  . DVT (deep venous thrombosis)     Diagnosed 02/13/12  - was placed on Warfarin after cardiac surgery but had skin reaction to warfarin so was changed to Pradaxa so DVT occurred while on Pradaxa although unclear if it occurred during transition  . Hypotension   . Atrial fibrillation     a. On pradaxa.  coumadin necrosis with warfarin;  b. Amio d/c'd 02/2012  . ICD (implantable cardiac defibrillator) in place     02/04/2012, [employer]  . Pericardial effusion   . Chronic combined systolic and diastolic CHF (congestive heart failure)     a. echo 03/19/12: severe asymmetric septal hypertrophy, evidence of upper septal myomectomy, peak LV outflow tract gradient 35, EF 35%,;  b.  Echo 9/13: Severe LVH, EF 30-35%, anteroseptal and inferior HK, LVOT narrow, mild AI, mild to moderate mitral stenosis, severe LAE.  Marland Kitchen Pericardial tamponade 03/14/2012  . 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 01/2012 s/p BSX ICD;  b. 06/2012 Multaq initiated due to recurrent syncope, VT/VF - had severe n/v with IV amio.  . Skin necrosis --COUMADIN   . Chronic chest wall pain     eval by CVTS, stable, rec return to [facility] if continued pain  . Syncope     a. in setting of VT/VF.    Past Surgical History  Procedure Date  . Cesarean section 1991  . Induced abortion 1990 and 1993  . US echocardiography 08/2011    severe LVH, severe asymmetric hypertrophy, EF 50-55%, grade 2 diastolic dysfunction  . Cardiac surgery 01/27/2012    median sternotomy, septal myectomy, MVR - [facility] (Dr. Silvestre Mesi)  . Cardiac defibrillator placement 01/2012  . Myomectomy   . Pericardial window 03/14/2012    Procedure: PERICARDIAL WINDOW;  Surgeon: Purcell Nails, MD;  Location: [facility] OR;  Service: Open Heart Surgery;  Laterality: N/A;  Subxyphoid Pericardial  Window     Family History  Problem Relation Age of Onset  . Diabetes Father   . Heart disease Father      unspecified heart problem  . Thyroid disease Mother   . Anemia Mother     mediterranean anemia, ITP  . Heart disease Maternal Grandfather     CHF  . Diabetes Paternal Grandfather   . Cancer Paternal Grandfather   . Coronary artery disease Neg Hx   . Stroke Neg Hx   . Sudden death Neg Hx     History  Substance Use Topics  . Smoking status: Former Smoker -- 1.0 packs/day for 3 years    Types: Cigarettes    Quit date: 09/30/1988  . Smokeless tobacco: Never Used     Comment: quitin 1990  . Alcohol Use: 0.0 oz/week     Comment: rarely drinks wine    OB History    Grav Para Term Preterm Abortions TAB SAB Ect Mult Living                  Review of Systems  Constitutional: Negative for fever.  HENT: Negative.   Eyes: Negative.   Respiratory: Negative.  Negative for shortness of breath.   Cardiovascular: Negative.  Negative for chest pain and leg swelling.  Gastrointestinal: Negative for nausea, vomiting, abdominal pain and bowel incontinence.  Genitourinary: Negative for bladder incontinence, dysuria, frequency and difficulty urinating.  Musculoskeletal: Positive for back pain.  Skin: Negative.   Neurological: Negative for dizziness, light-headedness and paresthesias.  Psychiatric/Behavioral: Negative.   All other systems reviewed and are negative.    Allergies  Phenergan; Amiodarone; and Warfarin and related  Home Medications   Current Outpatient Rx  Name  Route  Sig  Dispense  Refill  . ACETAMINOPHEN 500 MG PO TABS   Oral   Take 1,000-1,500 mg by mouth every 6 (six) hours as needed. For pain         . ALPRAZOLAM 0.5 MG PO TABS   Oral   Take 0.5 mg by mouth at bedtime as needed. For anxiety/sleep         . BECLOMETHASONE DIPROPIONATE 80 MCG/ACT IN AERS   Inhalation   Inhale 1 puff into the lungs 2 (two) times daily.         Marland Kitchen CETIRIZINE HCL 10 MG PO TABS   Oral   Take 10 mg by mouth daily.           Marland Kitchen DABIGATRAN ETEXILATE MESYLATE 150 MG PO CAPS    Oral   Take 1 capsule (150 mg total) by mouth every 12 (twelve) hours.   60 capsule   6   . DRONEDARONE HCL 400 MG PO TABS   Oral   Take 1 tablet (400 mg total) by mouth 2 (two) times daily with a meal.   60 tablet   6   . FUROSEMIDE 20 MG PO TABS   Oral   Take  20 mg by mouth daily.         Marland Kitchen LEVOTHYROXINE SODIUM 50 MCG PO TABS   Oral   Take 50 mcg by mouth every evening.          Marland Kitchen METOPROLOL SUCCINATE ER 25 MG PO TB24   Oral   Take 1 tablet (25 mg total) by mouth daily.   30 tablet   11   . POTASSIUM GLUCONATE 550 MG PO TABS   Oral   Take 1 tablet by mouth daily.          Marland Kitchen VITAMIN E 400 UNITS PO CAPS   Oral   Take 400 Units by mouth daily.           BP 99/67  Pulse 86  Temp 98.1 F (36.7 C) (Oral)  Resp 20  SpO2 97%  Physical Exam  Nursing note and vitals reviewed. Constitutional: She is oriented to person, place, and time. She appears well-developed and well-nourished.  HENT:  Head: Normocephalic and atraumatic.  Eyes: Conjunctivae normal and EOM are normal. Pupils are equal, round, and reactive to light.  Neck: Normal range of motion. Neck supple.  Cardiovascular: Normal rate and regular rhythm.   Murmur heard. Pulmonary/Chest: Effort normal and breath sounds normal. No respiratory distress. She has no wheezes.  Abdominal: Soft. Bowel sounds are normal. She exhibits no distension.  Musculoskeletal: Normal range of motion. She exhibits no edema and no tenderness.  Neurological: She is alert and oriented to person, place, and time. She has normal reflexes. No cranial nerve deficit.  Skin: Skin is warm and dry.  Psychiatric: She has a normal mood and affect.    ED Course  Procedures (including critical care time)   Labs Reviewed  CBC  BASIC METABOLIC PANEL   No results found.   No diagnosis found.    MDM  20-year-old female with back pain similar to the pain she was having when she had a cardiac hemorrhagic effusion with tamponade  on. Chest x-ray reviewed by myself and labs are unremarkable. Negative troponin.  Initially hypotensive.  NS bolus in the eR.   [facility] contacted for consult in the ER.    Labs Reviewed  CBC - Abnormal; Notable for the following:    RBC 5.15 (*)     MCH 24.9 (*)     All other components within normal limits  BASIC METABOLIC PANEL  TROPONIN I     Date: 10/05/2012  Rate: 79  Rhythm: normal sinus rhythm  QRS Axis: normal  Intervals: normal  ST/T Wave abnormalities: normal  Conduction Disutrbances:none  Narrative Interpretation: L atrial enlargement with LBBB   Old EKG Reviewed: unchanged       Remi Haggard, NP 10/05/12 1204

## 2012-10-05 NOTE — ED Notes (Signed)
C/o left upper back/shoulder blade area pain x 3 days. Reports last time she felt this pain she had a pericardial effusion & had surgery to have a pericardial window. Denies SOB or CP. Pain worse with mvmt, deep breaths.  States OTC meds not working. Denies any injury, heavy lifting, pulling or pushing

## 2012-10-26 ENCOUNTER — Telehealth: Payer: Self-pay | Admitting: Cardiovascular Disease

## 2012-10-26 NOTE — Telephone Encounter (Signed)
Pt calling for status of paperwork she brought by

## 2012-10-26 NOTE — Telephone Encounter (Signed)
Spoke to patient she stated she was checking to see if Dr.Cooper completed survey she brought in.Patient was told Dr.Cooper's nurse not in office today will send message to her.

## 2012-10-27 NOTE — Telephone Encounter (Signed)
I made the pt aware that I currently do not have any paperwork from her attorney for Dr Excell Seltzer to complete.  The pt is going to bring another copy of this form into the office for completion.

## 2012-11-10 ENCOUNTER — Encounter: Payer: Self-pay | Admitting: Internal Medicine

## 2012-11-10 ENCOUNTER — Ambulatory Visit (INDEPENDENT_AMBULATORY_CARE_PROVIDER_SITE_OTHER): Payer: Medicaid Other | Admitting: Internal Medicine

## 2012-11-10 VITALS — BP 100/70 | HR 79 | Ht 60.0 in | Wt 200.0 lb

## 2012-11-10 DIAGNOSIS — I4901 Ventricular fibrillation: Secondary | ICD-10-CM

## 2012-11-10 DIAGNOSIS — I472 Ventricular tachycardia, unspecified: Secondary | ICD-10-CM

## 2012-11-10 DIAGNOSIS — Z95810 Presence of automatic (implantable) cardiac defibrillator: Secondary | ICD-10-CM

## 2012-11-10 DIAGNOSIS — I5042 Chronic combined systolic (congestive) and diastolic (congestive) heart failure: Secondary | ICD-10-CM

## 2012-11-10 DIAGNOSIS — I509 Heart failure, unspecified: Secondary | ICD-10-CM

## 2012-11-10 LAB — ICD DEVICE OBSERVATION
AL IMPEDENCE ICD: 561 Ohm
CHARGE TIME: 9 s
DEVICE MODEL ICD: 102591
RV LEAD AMPLITUDE: 14.6 mv
RV LEAD THRESHOLD: 1 V
TZON-0003FASTVT: 352.9 ms

## 2012-11-10 NOTE — Patient Instructions (Addendum)
Your physician wants you to follow-up in: 6 months with Dr Taylor You will receive a reminder letter in the mail two months in advance. If you don't receive a letter, please call our office to schedule the follow-up appointment.  

## 2012-11-10 NOTE — Progress Notes (Signed)
HPI [name] returns today for followup. She is a very pleasant 42 year old woman with a hypertrophic cardiomyopathy status post myomectomy. She has ventricular tachycardia. She is status post ICD implantation. She was intolerant to amiodarone but has done well with Multaq. Since I saw the patient last, she has had no recurrent ventricular arrhythmias. She does have some nonsustained atrial tachycardia and flutter. No peripheral edema,  No chest pain, or shortness of breath. Allergies  Allergen Reactions  . Phenergan (Promethazine Hcl) Itching  . Amiodarone Nausea And Vomiting    N/V on IV amio 06/2012  . Warfarin And Related Other (See Comments)    Scars on skin     Current Outpatient Prescriptions  Medication Sig Dispense Refill  . acetaminophen (TYLENOL) 500 MG tablet Take 1,000-1,500 mg by mouth every 6 (six) hours as needed. For pain      . ALPRAZolam (XANAX) 0.5 MG tablet Take 0.5 mg by mouth at bedtime as needed. For anxiety/sleep      . beclomethasone (QVAR) 80 MCG/ACT inhaler Inhale 1 puff into the lungs 2 (two) times daily.      . cetirizine (ZYRTEC) 10 MG tablet Take 10 mg by mouth daily.        . dabigatran (PRADAXA) 150 MG CAPS Take 1 capsule (150 mg total) by mouth every 12 (twelve) hours.  60 capsule  6  . dronedarone (MULTAQ) 400 MG tablet Take 1 tablet (400 mg total) by mouth 2 (two) times daily with a meal.  60 tablet  6  . furosemide (LASIX) 20 MG tablet Take 20 mg by mouth daily.      Marland Kitchen levothyroxine (SYNTHROID) 50 MCG tablet Take 50 mcg by mouth every evening.       . metoprolol succinate (TOPROL XL) 25 MG 24 hr tablet Take 1 tablet (25 mg total) by mouth daily.  30 tablet  11  . Potassium Gluconate 550 MG TABS Take 1 tablet by mouth daily.       . traMADol (ULTRAM) 50 MG tablet Take 1 tablet (50 mg total) by mouth every 6 (six) hours as needed for pain.  15 tablet  0  . vitamin E 400 UNIT capsule Take 400 Units by mouth daily.       No current facility-administered  medications for this visit.     Past Medical History  Diagnosis Date  . Hypertrophic cardiomyopathy     s/p septal myomectomy with implantable defibrillator 01/27/2012   . Anxiety state, unspecified   . Allergic rhinitis     to dogs  . Asthma   . History of anemia     during pregnancy  . History of chicken pox   . Generalized headaches   . Ocular migraine     no HA  . Back pain     told cracked bone, vicodin didn't help, improved with percocets/muscle relaxants, no eval by prior PCP  . 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 DVT (deep venous thrombosis)     Diagnosed 02/13/12  - was placed on Warfarin after cardiac surgery but had skin reaction to warfarin so was changed to Pradaxa so DVT occurred while on Pradaxa although unclear if it occurred during transition  . Hypotension   . Atrial fibrillation     a. On pradaxa.  coumadin necrosis with warfarin;  b. Amio d/c'd 02/2012  . ICD (implantable cardiac defibrillator) in place     02/04/2012,  [employer]  . Pericardial effusion   . Chronic combined systolic and diastolic CHF (congestive heart failure)     a. echo 03/19/12: severe asymmetric septal hypertrophy, evidence of upper septal myomectomy, peak LV outflow tract gradient 35, EF 35%,;  b.  Echo 9/13: Severe LVH, EF 30-35%, anteroseptal and inferior HK, LVOT narrow, mild AI, mild to moderate mitral stenosis, severe LAE.  Marland Kitchen Pericardial tamponade 03/14/2012  . 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 01/2012 s/p BSX ICD;  b. 06/2012 Multaq initiated due to recurrent syncope, VT/VF - had severe n/v with IV amio.  . Skin necrosis --COUMADIN   . Chronic chest wall pain     eval by CVTS, stable, rec return to [facility] if continued pain  . Syncope     a. in setting of VT/VF.    ROS:   All systems reviewed and negative except as noted in the HPI.   Past Surgical History  Procedure Laterality Date  . Cesarean section  1991   . Induced abortion  1990 and 1993  . Cardiac surgery  01/27/2012    median sternotomy, septal myectomy, MVR - [facility] (Dr. Silvestre Mesi)  . Cardiac defibrillator placement  01/2012  . Myomectomy    . Pericardial window  03/14/2012    Procedure: PERICARDIAL WINDOW;  Surgeon: Purcell Nails, MD;  Location: [facility] OR;  Service: Open Heart Surgery;  Laterality: N/A;  Subxyphoid Pericardial  Window      Family History  Problem Relation Age of Onset  . Diabetes Father   . Heart disease Father     unspecified heart problem  . Thyroid disease Mother   . Anemia Mother     mediterranean anemia, ITP  . Heart disease Maternal Grandfather     CHF  . Diabetes Paternal Grandfather   . Cancer Paternal Grandfather   . Coronary artery disease Neg Hx   . Stroke Neg Hx   . Sudden death Neg Hx      History   Social History  . Marital Status: Single    Spouse Name: N/A    Number of Children: 1  . Years of Education: N/A   Occupational History  . Naval architect    Social History Main Topics  . Smoking status: Former Smoker -- 1.00 packs/day for 3 years    Types: Cigarettes    Quit date: 09/30/1988  . Smokeless tobacco: Never Used     Comment: quitin 1990  . Alcohol Use: 0.0 oz/week     Comment: rarely drinks wine  . Drug Use: No     Comment: quiti n 1989  . Sexually Active: Yes    Birth Control/ Protection: Pill   Other Topics Concern  . Not on file   Social History Narrative   Caffeine: 1 cup coffee/day   Divorced, 1 child.  lives with boyfriend, dogs   Occupation: Full time Tax adviser   Activity: walks 61min/day   Diet: fruits/vegetables daily, water, no fish     BP 100/70  Pulse 79  Ht 5' (1.524 m)  Wt 200 lb (90.719 kg)  BMI 39.06 kg/m2  Physical Exam:  obese appearing middle-aged woman,NAD HEENT: Unremarkable Neck:  No JVD, no thyromegally Lungs:  Clear with no wheezes, rales, or rhonchi. HEART:  Regular rate rhythm, no murmurs, no rubs, no  clicks Abd:  soft, positive bowel sounds, no organomegally, no rebound, no guarding Ext:  2 plus pulses, no edema, no cyanosis, no clubbing Skin:  No rashes no nodules Neuro:  CN II through XII intact, motor grossly intact  DEVICE  Normal device function.  See PaceArt for details.   Assess/Plan:

## 2012-11-10 NOTE — Assessment & Plan Note (Signed)
She has had no recurrent sustained ventricular arrhythmias since beginning antiarrhythmic drug therapy with dronendarone. She will continue her current medications.

## 2012-11-10 NOTE — Assessment & Plan Note (Signed)
Her ICD is working normally. We'll plan to recheck in several months. 

## 2012-11-10 NOTE — Assessment & Plan Note (Signed)
Her chronic systolic and diastolic heart failure symptoms are class II. No change in medical therapy at this time.

## 2012-11-11 ENCOUNTER — Emergency Department (HOSPITAL_COMMUNITY): Payer: Medicaid Other

## 2012-11-11 ENCOUNTER — Encounter (HOSPITAL_COMMUNITY): Payer: Self-pay | Admitting: *Deleted

## 2012-11-11 ENCOUNTER — Emergency Department (HOSPITAL_COMMUNITY)
Admission: EM | Admit: 2012-11-11 | Discharge: 2012-11-11 | Disposition: A | Discharge status: Home / Self Care | Payer: Medicaid Other | Attending: Emergency Medicine | Admitting: Emergency Medicine

## 2012-11-11 DIAGNOSIS — Z872 Personal history of diseases of the skin and subcutaneous tissue: Secondary | ICD-10-CM | POA: Insufficient documentation

## 2012-11-11 DIAGNOSIS — R0789 Other chest pain: Secondary | ICD-10-CM

## 2012-11-11 DIAGNOSIS — Z79899 Other long term (current) drug therapy: Secondary | ICD-10-CM | POA: Insufficient documentation

## 2012-11-11 DIAGNOSIS — Z8739 Personal history of other diseases of the musculoskeletal system and connective tissue: Secondary | ICD-10-CM | POA: Insufficient documentation

## 2012-11-11 DIAGNOSIS — Z862 Personal history of diseases of the blood and blood-forming organs and certain disorders involving the immune mechanism: Secondary | ICD-10-CM | POA: Insufficient documentation

## 2012-11-11 DIAGNOSIS — F411 Generalized anxiety disorder: Secondary | ICD-10-CM | POA: Insufficient documentation

## 2012-11-11 DIAGNOSIS — G8929 Other chronic pain: Secondary | ICD-10-CM | POA: Insufficient documentation

## 2012-11-11 DIAGNOSIS — S298XXA Other specified injuries of thorax, initial encounter: Secondary | ICD-10-CM | POA: Insufficient documentation

## 2012-11-11 DIAGNOSIS — Z87891 Personal history of nicotine dependence: Secondary | ICD-10-CM | POA: Insufficient documentation

## 2012-11-11 DIAGNOSIS — J45909 Unspecified asthma, uncomplicated: Secondary | ICD-10-CM | POA: Insufficient documentation

## 2012-11-11 DIAGNOSIS — IMO0002 Reserved for concepts with insufficient information to code with codable children: Secondary | ICD-10-CM | POA: Insufficient documentation

## 2012-11-11 DIAGNOSIS — I509 Heart failure, unspecified: Secondary | ICD-10-CM | POA: Insufficient documentation

## 2012-11-11 DIAGNOSIS — Z8679 Personal history of other diseases of the circulatory system: Secondary | ICD-10-CM | POA: Insufficient documentation

## 2012-11-11 DIAGNOSIS — Y9389 Activity, other specified: Secondary | ICD-10-CM | POA: Insufficient documentation

## 2012-11-11 DIAGNOSIS — Z8619 Personal history of other infectious and parasitic diseases: Secondary | ICD-10-CM | POA: Insufficient documentation

## 2012-11-11 DIAGNOSIS — Z86718 Personal history of other venous thrombosis and embolism: Secondary | ICD-10-CM | POA: Insufficient documentation

## 2012-11-11 DIAGNOSIS — R071 Chest pain on breathing: Secondary | ICD-10-CM | POA: Insufficient documentation

## 2012-11-11 DIAGNOSIS — Y92410 Unspecified street and highway as the place of occurrence of the external cause: Secondary | ICD-10-CM | POA: Insufficient documentation

## 2012-11-11 LAB — POCT I-STAT, CHEM 8
BUN: 16 mg/dL (ref 6–23)
HCT: 43 % (ref 36.0–46.0)
Sodium: 140 mEq/L (ref 135–145)
TCO2: 26 mmol/L (ref 0–100)

## 2012-11-11 MED ORDER — MORPHINE SULFATE 4 MG/ML IJ SOLN: 4.0000 mg | Freq: Once | INTRAMUSCULAR | Status: DC

## 2012-11-11 MED ORDER — OXYCODONE-ACETAMINOPHEN 5-325 MG PO TABS
1.0000 | ORAL_TABLET | Freq: Four times a day (QID) | ORAL | Status: DC | PRN
Start: 2012-11-11 — End: 2013-02-28

## 2012-11-11 MED ORDER — IOHEXOL 300 MG/ML  SOLN
100.0000 mL | Freq: Once | INTRAMUSCULAR | Status: AC | PRN
  Administered 2012-11-11: 100 mL via INTRAVENOUS

## 2012-11-11 MED ORDER — OXYCODONE-ACETAMINOPHEN 5-325 MG PO TABS
2.0000 | ORAL_TABLET | Freq: Once | ORAL | Status: AC
  Administered 2012-11-11: 2 via ORAL
  Filled 2012-11-11: qty 2

## 2012-11-11 MED ORDER — CYCLOBENZAPRINE HCL 10 MG PO TABS: 10.0000 mg | ORAL_TABLET | Freq: Three times a day (TID) | ORAL | Status: DC | PRN
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
## 2012-11-11 NOTE — ED Notes (Signed)
Radiology notified that IV placed.

## 2012-11-11 NOTE — ED Notes (Signed)
Per PA change acuity to 3.
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
## 2012-11-12 NOTE — ED Provider Notes (Signed)
Medical screening examination/treatment/procedure(s) were performed by non-physician practitioner and as supervising physician I was immediately available for consultation/collaboration.   Joya Gaskins, MD 11/12/12 [phone]

## 2012-11-16 ENCOUNTER — Telehealth: Payer: Self-pay | Admitting: Cardiovascular Disease

## 2012-11-16 ENCOUNTER — Encounter: Payer: Self-pay | Admitting: Internal Medicine

## 2012-11-16 NOTE — Telephone Encounter (Signed)
Pt Called In asking For [employer] Questionnaire to be Faxed Again I called [employer] and Refaxed to [phone] # [id number]   11/16/12/KM

## 2012-11-18 ENCOUNTER — Ambulatory Visit: Payer: Medicaid Other | Admitting: Cardiovascular Disease

## 2012-12-21 ENCOUNTER — Ambulatory Visit: Payer: Medicaid Other | Attending: Anesthesiology | Admitting: Physical Therapy

## 2012-12-21 DIAGNOSIS — M542 Cervicalgia: Secondary | ICD-10-CM | POA: Insufficient documentation

## 2012-12-21 DIAGNOSIS — IMO0001 Reserved for inherently not codable concepts without codable children: Secondary | ICD-10-CM | POA: Insufficient documentation

## 2012-12-21 DIAGNOSIS — M545 Low back pain, unspecified: Secondary | ICD-10-CM | POA: Insufficient documentation

## 2012-12-28 ENCOUNTER — Encounter: Payer: Self-pay | Admitting: Cardiovascular Disease

## 2012-12-28 ENCOUNTER — Ambulatory Visit (INDEPENDENT_AMBULATORY_CARE_PROVIDER_SITE_OTHER): Payer: Medicaid Other | Admitting: Cardiovascular Disease

## 2012-12-28 VITALS — BP 106/60 | HR 77 | Ht 60.0 in | Wt 199.0 lb

## 2012-12-28 DIAGNOSIS — I421 Obstructive hypertrophic cardiomyopathy: Secondary | ICD-10-CM

## 2012-12-28 DIAGNOSIS — I472 Ventricular tachycardia: Secondary | ICD-10-CM

## 2012-12-28 NOTE — Patient Instructions (Addendum)
Your physician has requested that you have an echocardiogram. Echocardiography is a painless test that uses sound waves to create images of your heart. It provides your doctor with information about the size and shape of your heart and how well your heart's chambers and valves are working. This procedure takes approximately one hour. There are no restrictions for this procedure.  Your physician wants you to follow-up in: 4 MONTHS with Dr Excell Seltzer.  You will receive a reminder letter in the mail two months in advance. If you don't receive a letter, please call our office to schedule the follow-up appointment.  Your physician recommends that you continue on your current medications as directed. Please refer to the Current Medication list given to you today.

## 2012-12-28 NOTE — Progress Notes (Signed)
HPI:  42 year old woman presenting for followup evaluation. She has hypertrophic cardiomyopathy status post septal myectomy and mitral valve repair one year ago. Her postoperative course was complicated by ventricular tachycardia and atrial fibrillation. An ICD was implanted. She developed a left upper extremity DVT but had a skin reaction from Coumadin. She also had recurrent ICD shocks for VT/VF. She's been maintained on dronedarone.   The patient has been stable from a cardiac perspective. She denies any further ICD discharges. She remains limited by shortness of breath with low to moderate level activity. She said chest pain over the left chest worse with deep inspiration. She's also had a cough. She complains of mild leg swelling. She denies orthopnea or PND. She's having difficulty getting around because of transportation issues.  Outpatient Encounter Prescriptions as of 12/28/2012  Medication Sig Dispense Refill  . acetaminophen (TYLENOL) 500 MG tablet Take 1,000-1,500 mg by mouth every 6 (six) hours as needed. For pain      . ALPRAZolam (XANAX) 0.5 MG tablet Take 0.5 mg by mouth at bedtime as needed. For anxiety/sleep      . amoxicillin (AMOXIL) 500 MG capsule Take 500 mg by mouth 3 (three) times daily.      . beclomethasone (QVAR) 80 MCG/ACT inhaler Inhale 1 puff into the lungs 2 (two) times daily.      . cetirizine (ZYRTEC) 10 MG tablet Take 10 mg by mouth daily.        . cyclobenzaprine (FLEXERIL) 10 MG tablet Take 1 tablet (10 mg total) by mouth 3 (three) times daily as needed for muscle spasms.  15 tablet  0  . dabigatran (PRADAXA) 150 MG CAPS Take 1 capsule (150 mg total) by mouth every 12 (twelve) hours.  60 capsule  6  . dronedarone (MULTAQ) 400 MG tablet Take 1 tablet (400 mg total) by mouth 2 (two) times daily with a meal.  60 tablet  6  . furosemide (LASIX) 20 MG tablet Take 20 mg by mouth daily.      Marland Kitchen levothyroxine (SYNTHROID) 50 MCG tablet Take 50 mcg by mouth every evening.        . metoprolol succinate (TOPROL XL) 25 MG 24 hr tablet Take 1 tablet (25 mg total) by mouth daily.  30 tablet  11  . Olopatadine HCl (PATANASE) 0.6 % SOLN Place 2 puffs into the nose 2 (two) times daily.      Marland Kitchen oxyCODONE-acetaminophen (PERCOCET/ROXICET) 5-325 MG per tablet Take 1 tablet by mouth every 6 (six) hours as needed for pain.  15 tablet  0  . Potassium Gluconate 550 MG TABS Take 1 tablet by mouth daily.       . vitamin E 400 UNIT capsule Take 400 Units by mouth daily.       No facility-administered encounter medications on file as of 12/28/2012.    Allergies  Allergen Reactions  . Phenergan (Promethazine Hcl) Itching  . Amiodarone Nausea And Vomiting    N/V on IV amio 06/2012  . Warfarin And Related Other (See Comments)    Scars on skin    Past Medical History  Diagnosis Date  . Hypertrophic cardiomyopathy     s/p septal myomectomy with implantable defibrillator 01/27/2012   . Anxiety state, unspecified   . Allergic rhinitis     to dogs  . Asthma   . History of anemia     during pregnancy  . History of chicken pox   . Generalized headaches   . Ocular migraine  no HA  . Back pain     told cracked bone, vicodin didn't help, improved with percocets/muscle relaxants, no eval by prior PCP  . 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 DVT (deep venous thrombosis)     Diagnosed 02/13/12  - was placed on Warfarin after cardiac surgery but had skin reaction to warfarin so was changed to Pradaxa so DVT occurred while on Pradaxa although unclear if it occurred during transition  . Hypotension   . Atrial fibrillation     a. On pradaxa.  coumadin necrosis with warfarin;  b. Amio d/c'd 02/2012  . ICD (implantable cardiac defibrillator) in place     02/04/2012, [employer]  . Pericardial effusion   . Chronic combined systolic and diastolic CHF (congestive heart failure)     a. echo 03/19/12: severe asymmetric septal  hypertrophy, evidence of upper septal myomectomy, peak LV outflow tract gradient 35, EF 35%,;  b.  Echo 9/13: Severe LVH, EF 30-35%, anteroseptal and inferior HK, LVOT narrow, mild AI, mild to moderate mitral stenosis, severe LAE.  Marland Kitchen Pericardial tamponade 03/14/2012  . 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 01/2012 s/p BSX ICD;  b. 06/2012 Multaq initiated due to recurrent syncope, VT/VF - had severe n/v with IV amio.  . Skin necrosis --COUMADIN   . Chronic chest wall pain     eval by CVTS, stable, rec return to [facility] if continued pain  . Syncope     a. in setting of VT/VF.    ROS: Negative except as per HPI  BP 106/60  Pulse 77  Ht 5' (1.524 m)  Wt 199 lb (90.266 kg)  BMI 38.86 kg/m2  SpO2 97%  PHYSICAL EXAM: Pt is alert and oriented, NAD HEENT: normal Neck: JVP - normal, carotids 2+= without bruits Lungs: CTA bilaterally CV: RRR with grade 3/6 midsystolic murmur best heard at the left upper sternal border Abd: soft, NT, Positive BS, no hepatomegaly Ext: Trace pretibial edema bilaterally, distal pulses intact and equal Skin: warm/dry no rash  ASSESSMENT AND PLAN: 1. Chronic mixed systolic and diastolic heart failure related to underlying hypertrophic cardiomyopathy. The patient is stable on her current medical program. She is one year out from surgery. Recommend repeat 2-D echocardiogram.  2. Ventricular tachycardia. Stable on dronedarone. Followed by Dr Ladona Ridgel.  3. PAF - maintained on anticoagulation with Pradaxa.  We filled out SCAT paperwork today to help with her transportation. She requested Xanax for anxiety and I advised she needs to discuss with her PCP. Will see her back in 4 months.  Tonny Bollman 12/28/2012 10:54 AM

## 2012-12-29 ENCOUNTER — Ambulatory Visit: Payer: Medicaid Other | Admitting: Physical Therapy

## 2012-12-29 ENCOUNTER — Ambulatory Visit: Payer: Medicaid Other | Attending: Anesthesiology | Admitting: Physical Therapy

## 2012-12-29 DIAGNOSIS — IMO0001 Reserved for inherently not codable concepts without codable children: Secondary | ICD-10-CM | POA: Insufficient documentation

## 2012-12-29 DIAGNOSIS — M545 Low back pain, unspecified: Secondary | ICD-10-CM | POA: Insufficient documentation

## 2012-12-29 DIAGNOSIS — M542 Cervicalgia: Secondary | ICD-10-CM | POA: Insufficient documentation

## 2012-12-30 ENCOUNTER — Ambulatory Visit (HOSPITAL_COMMUNITY): Payer: Medicaid Other | Attending: Cardiovascular Disease | Admitting: Radiology

## 2012-12-30 DIAGNOSIS — I472 Ventricular tachycardia, unspecified: Secondary | ICD-10-CM | POA: Insufficient documentation

## 2012-12-30 DIAGNOSIS — R0602 Shortness of breath: Secondary | ICD-10-CM

## 2012-12-30 DIAGNOSIS — I421 Obstructive hypertrophic cardiomyopathy: Secondary | ICD-10-CM | POA: Insufficient documentation

## 2012-12-30 DIAGNOSIS — I4729 Other ventricular tachycardia: Secondary | ICD-10-CM | POA: Insufficient documentation

## 2012-12-30 NOTE — Progress Notes (Signed)
Echocardiogram performed.  

## 2013-01-11 ENCOUNTER — Telehealth: Payer: Self-pay | Admitting: Cardiovascular Disease

## 2013-01-11 NOTE — Telephone Encounter (Signed)
Spoke w/pt and has not hooked up [employer]. Joey to call pt and possibly go to pt's house and help hook up transmitter with adapter. Transmission to be sent once hooked up.

## 2013-01-11 NOTE — Telephone Encounter (Signed)
New problem   Pt thinks she had a shock from her debif yesterday. Pt would like to talk to someone about this problem.

## 2013-01-12 NOTE — Telephone Encounter (Signed)
Transmission was received and pt did have VF episode with successful shock. Dr Johney Frame evaluated and will put in red folder for Dr Ladona Ridgel. Pt was advised no driving x 6 mths and to call office if any other episodes.

## 2013-01-18 ENCOUNTER — Telehealth: Payer: Self-pay | Admitting: Internal Medicine

## 2013-01-18 NOTE — Telephone Encounter (Signed)
Spoke w/[name] and transmission was received. Episode will be showed to Dr Ladona Ridgel on 01-20-13 as well as episode on 01-10-13. [name] feels fine at this time. Instructed if receives another shock between now and Wednesday to report to ER. [name] understands.

## 2013-01-18 NOTE — Telephone Encounter (Signed)
New Prob   Pt believes she just had a episode of vfib and is sending over a remote check. Would like to speak to nurse.

## 2013-01-19 ENCOUNTER — Emergency Department (HOSPITAL_COMMUNITY): Payer: Medicaid Other

## 2013-01-19 ENCOUNTER — Encounter (HOSPITAL_COMMUNITY): Payer: Self-pay | Admitting: Nurse Practitioner

## 2013-01-19 ENCOUNTER — Inpatient Hospital Stay (HOSPITAL_COMMUNITY)
Admission: EM | Admit: 2013-01-19 | Discharge: 2013-01-21 | DRG: 309 | Disposition: A | Discharge status: Home / Self Care | Payer: Medicaid Other | Attending: Cardiology | Admitting: Cardiology

## 2013-01-19 DIAGNOSIS — I472 Ventricular tachycardia, unspecified: Principal | ICD-10-CM | POA: Diagnosis present

## 2013-01-19 DIAGNOSIS — I4729 Other ventricular tachycardia: Principal | ICD-10-CM | POA: Diagnosis present

## 2013-01-19 DIAGNOSIS — Z79899 Other long term (current) drug therapy: Secondary | ICD-10-CM

## 2013-01-19 DIAGNOSIS — I447 Left bundle-branch block, unspecified: Secondary | ICD-10-CM | POA: Diagnosis present

## 2013-01-19 DIAGNOSIS — I421 Obstructive hypertrophic cardiomyopathy: Secondary | ICD-10-CM | POA: Diagnosis present

## 2013-01-19 DIAGNOSIS — F411 Generalized anxiety disorder: Secondary | ICD-10-CM | POA: Diagnosis present

## 2013-01-19 DIAGNOSIS — Z86718 Personal history of other venous thrombosis and embolism: Secondary | ICD-10-CM

## 2013-01-19 DIAGNOSIS — Z95810 Presence of automatic (implantable) cardiac defibrillator: Secondary | ICD-10-CM

## 2013-01-19 DIAGNOSIS — J45909 Unspecified asthma, uncomplicated: Secondary | ICD-10-CM | POA: Diagnosis present

## 2013-01-19 DIAGNOSIS — I5042 Chronic combined systolic (congestive) and diastolic (congestive) heart failure: Secondary | ICD-10-CM | POA: Diagnosis present

## 2013-01-19 DIAGNOSIS — I509 Heart failure, unspecified: Secondary | ICD-10-CM | POA: Diagnosis present

## 2013-01-19 DIAGNOSIS — Z87891 Personal history of nicotine dependence: Secondary | ICD-10-CM

## 2013-01-19 DIAGNOSIS — I4901 Ventricular fibrillation: Secondary | ICD-10-CM

## 2013-01-19 DIAGNOSIS — E039 Hypothyroidism, unspecified: Secondary | ICD-10-CM | POA: Diagnosis present

## 2013-01-19 DIAGNOSIS — I4891 Unspecified atrial fibrillation: Secondary | ICD-10-CM | POA: Diagnosis present

## 2013-01-19 HISTORY — DX: Hypothyroidism, unspecified: E03.9

## 2013-01-19 LAB — CBC
HCT: 38.5 % (ref 36.0–46.0)
Hemoglobin: 12.9 g/dL (ref 12.0–15.0)
MCHC: 33.5 g/dL (ref 30.0–36.0)
MCV: 80.9 fL (ref 78.0–100.0)
WBC: 5.7 10*3/uL (ref 4.0–10.5)

## 2013-01-19 LAB — BASIC METABOLIC PANEL
BUN: 16 mg/dL (ref 6–23)
Calcium: 9.3 mg/dL (ref 8.4–10.5)
Creatinine, Ser: 0.8 mg/dL (ref 0.50–1.10)
GFR calc non Af Amer: 90 mL/min — ABNORMAL LOW (ref >90)
Glucose, Bld: 142 mg/dL — ABNORMAL HIGH (ref 70–99)

## 2013-01-19 LAB — MAGNESIUM: Magnesium: 2.1 mg/dL (ref 1.5–2.5)

## 2013-01-19 MED ORDER — ACETAMINOPHEN 325 MG PO TABS
650.0000 mg | ORAL_TABLET | ORAL | Status: DC | PRN
  Administered 2013-01-20: 650 mg via ORAL
  Filled 2013-01-19: qty 2

## 2013-01-19 MED ORDER — POTASSIUM GLUCONATE 550 MG PO TABS
550.0000 mg | ORAL_TABLET | Freq: Every day | ORAL | Status: DC
  Filled 2013-01-19 (×3): qty 1

## 2013-01-19 MED ORDER — METOPROLOL SUCCINATE ER 25 MG PO TB24
25.0000 mg | ORAL_TABLET | Freq: Every day | ORAL | Status: DC
  Administered 2013-01-20 – 2013-01-21 (×2): 25 mg via ORAL
  Filled 2013-01-19 (×2): qty 1

## 2013-01-19 MED ORDER — SODIUM CHLORIDE 0.9 % IJ SOLN
3.0000 mL | INTRAMUSCULAR | Status: DC | PRN
  Administered 2013-01-20 – 2013-01-21 (×2): 3 mL via INTRAVENOUS

## 2013-01-19 MED ORDER — OXYCODONE-ACETAMINOPHEN 5-325 MG PO TABS
1.0000 | ORAL_TABLET | Freq: Four times a day (QID) | ORAL | Status: DC | PRN
  Administered 2013-01-19 – 2013-01-21 (×3): 1 via ORAL
  Filled 2013-01-19 (×3): qty 1

## 2013-01-19 MED ORDER — FUROSEMIDE 20 MG PO TABS
20.0000 mg | ORAL_TABLET | Freq: Every day | ORAL | Status: DC
  Filled 2013-01-19: qty 1

## 2013-01-19 MED ORDER — DABIGATRAN ETEXILATE MESYLATE 150 MG PO CAPS
150.0000 mg | ORAL_CAPSULE | Freq: Two times a day (BID) | ORAL | Status: DC
Start: 2013-01-19 — End: 2013-01-21
  Administered 2013-01-19 – 2013-01-21 (×4): 150 mg via ORAL
  Filled 2013-01-19 (×5): qty 1

## 2013-01-19 MED ORDER — DRONEDARONE HCL 400 MG PO TABS
400.0000 mg | ORAL_TABLET | Freq: Two times a day (BID) | ORAL | Status: DC
  Administered 2013-01-19 – 2013-01-21 (×4): 400 mg via ORAL
  Filled 2013-01-19 (×7): qty 1

## 2013-01-19 MED ORDER — FLUTICASONE PROPIONATE HFA 44 MCG/ACT IN AERO
1.0000 | INHALATION_SPRAY | Freq: Two times a day (BID) | RESPIRATORY_TRACT | Status: DC
  Administered 2013-01-20 – 2013-01-21 (×3): 1 via RESPIRATORY_TRACT
  Filled 2013-01-19 (×2): qty 10.6

## 2013-01-19 MED ORDER — LORATADINE 10 MG PO TABS
10.0000 mg | ORAL_TABLET | Freq: Every day | ORAL | Status: DC
  Filled 2013-01-19 (×2): qty 1

## 2013-01-19 MED ORDER — SODIUM CHLORIDE 0.9 % IV SOLN: 250.0000 mL | INTRAVENOUS | Status: DC | PRN

## 2013-01-19 MED ORDER — POTASSIUM GLUCONATE 550 MG PO TABS: 1.0000 | ORAL_TABLET | Freq: Every day | ORAL | Status: DC

## 2013-01-19 MED ORDER — ALPRAZOLAM 0.5 MG PO TABS: 1.0000 mg | ORAL_TABLET | Freq: Every evening | ORAL | Status: DC | PRN

## 2013-01-19 MED ORDER — SODIUM CHLORIDE 0.9 % IJ SOLN
3.0000 mL | Freq: Two times a day (BID) | INTRAMUSCULAR | Status: DC
  Administered 2013-01-19 – 2013-01-21 (×4): 3 mL via INTRAVENOUS

## 2013-01-19 MED ORDER — LEVOTHYROXINE SODIUM 50 MCG PO TABS
50.0000 ug | ORAL_TABLET | Freq: Every evening | ORAL | Status: DC
  Administered 2013-01-21: 50 ug via ORAL
  Filled 2013-01-19 (×3): qty 1

## 2013-01-19 NOTE — ED Notes (Addendum)
Pt reports last Sunday she was sitting in friends car and passed out, defib fired and woke her up. Yesterday am she felt foggy in her head and sat in floor and passed out, woke up and defib had fired again. Reports she did not have a ride to hospital until today. Called cardiology and they urged her to come for eval if defib fired again. Today when walking around she began to feel foggy headed again and decided to come in. defib has not fired today. No pain. A&Ox4, resp e/u

## 2013-01-19 NOTE — ED Provider Notes (Signed)
History     CSN: [id number]  Arrival date & time 01/19/13  1220   First MD Initiated Contact with Patient 01/19/13 1250      Chief Complaint  Patient presents with  . Dizziness    (Consider location/radiation/quality/duration/timing/severity/associated sxs/prior treatment) HPI....status post pacemaker/ defibrillator placement at [facility] in the spring of 2013 secondary to problems with both atrial and ventricular fibrillation.   Apparently defibrillator activated approximately 9 days ago and then again yesterday.  Today, she felt dizzy and lightheaded.  No chest pain or dyspnea.  Nothing makes symptoms better or worse. Severity is moderate     Past Medical History  Diagnosis Date  . Hypertrophic cardiomyopathy     s/p septal myomectomy with implantable defibrillator 01/27/2012   . Anxiety state, unspecified   . Allergic rhinitis     to dogs  . Asthma   . History of anemia     during pregnancy  . History of chicken pox   . Generalized headaches   . Ocular migraine     no HA  . Back pain     told cracked bone, vicodin didn't help, improved with percocets/muscle relaxants, no eval by prior PCP  . 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 DVT (deep venous thrombosis)     Diagnosed 02/13/12  - was placed on Warfarin after cardiac surgery but had skin reaction to warfarin so was changed to Pradaxa so DVT occurred while on Pradaxa although unclear if it occurred during transition  . Hypotension   . Atrial fibrillation     a. On pradaxa.  coumadin necrosis with warfarin;  b. Amio d/c'd 02/2012  . ICD (implantable cardiac defibrillator) in place     02/04/2012, [employer]  . Pericardial effusion   . Chronic combined systolic and diastolic CHF (congestive heart failure)     a. echo 03/19/12: severe asymmetric septal hypertrophy, evidence of upper septal myomectomy, peak LV outflow tract gradient 35, EF 35%,;  b.  Echo 9/13:  Severe LVH, EF 30-35%, anteroseptal and inferior HK, LVOT narrow, mild AI, mild to moderate mitral stenosis, severe LAE.  Marland Kitchen Pericardial tamponade 03/14/2012  . 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 01/2012 s/p BSX ICD;  b. 06/2012 Multaq initiated due to recurrent syncope, VT/VF - had severe n/v with IV amio.  . Skin necrosis --COUMADIN   . Chronic chest wall pain     eval by CVTS, stable, rec return to [facility] if continued pain  . Syncope     a. in setting of VT/VF.    Past Surgical History  Procedure Laterality Date  . Cesarean section  1991  . Induced abortion  1990 and 1993  . Cardiac surgery  01/27/2012    median sternotomy, septal myectomy, MVR - [facility] (Dr. Silvestre Mesi)  . Cardiac defibrillator placement  01/2012  . Myomectomy    . Pericardial window  03/14/2012    Procedure: PERICARDIAL WINDOW;  Surgeon: Purcell Nails, MD;  Location: [facility] OR;  Service: Open Heart Surgery;  Laterality: N/A;  Subxyphoid Pericardial  Window     Family History  Problem Relation Age of Onset  . Diabetes Father   . Heart disease Father     unspecified heart problem  . Thyroid disease Mother   . Anemia Mother     mediterranean anemia, ITP  . Heart disease Maternal Grandfather     CHF  . Diabetes Paternal  Grandfather   . Cancer Paternal Grandfather   . Coronary artery disease Neg Hx   . Stroke Neg Hx   . Sudden death Neg Hx     History  Substance Use Topics  . Smoking status: Former Smoker -- 1.00 packs/day for 3 years    Types: Cigarettes    Quit date: 09/30/1988  . Smokeless tobacco: Never Used     Comment: quitin 1990  . Alcohol Use: 0.0 oz/week     Comment: rarely drinks wine    OB History   Grav Para Term Preterm Abortions TAB SAB Ect Mult Living                  Review of Systems  All other systems reviewed and are negative.    Allergies  Phenergan; Amiodarone; and Warfarin and related  Home Medications   Current Outpatient Rx  Name  Route  Sig  Dispense  Refill  .  ALPRAZolam (XANAX) 1 MG tablet   Oral   Take 1 mg by mouth at bedtime as needed for sleep.         . beclomethasone (QVAR) 80 MCG/ACT inhaler   Inhalation   Inhale 1 puff into the lungs 2 (two) times daily.         . cetirizine (ZYRTEC) 10 MG tablet   Oral   Take 10 mg by mouth daily.           . dabigatran (PRADAXA) 150 MG CAPS   Oral   Take 1 capsule (150 mg total) by mouth every 12 (twelve) hours.   60 capsule   6   . dronedarone (MULTAQ) 400 MG tablet   Oral   Take 1 tablet (400 mg total) by mouth 2 (two) times daily with a meal.   60 tablet   6   . fexofenadine (ALLEGRA) 180 MG tablet   Oral   Take 180 mg by mouth daily.         . furosemide (LASIX) 20 MG tablet   Oral   Take 20 mg by mouth daily.         Marland Kitchen levothyroxine (SYNTHROID) 50 MCG tablet   Oral   Take 50 mcg by mouth every evening.          . metoprolol succinate (TOPROL XL) 25 MG 24 hr tablet   Oral   Take 1 tablet (25 mg total) by mouth daily.   30 tablet   11   . oxyCODONE-acetaminophen (PERCOCET/ROXICET) 5-325 MG per tablet   Oral   Take 1 tablet by mouth every 6 (six) hours as needed for pain.   15 tablet   0   . Potassium Gluconate 550 MG TABS   Oral   Take 1 tablet by mouth daily.            BP 88/59  Pulse 70  Temp(Src) 97.9 F (36.6 C) (Oral)  Resp 14  SpO2 97%  Physical Exam  Nursing note and vitals reviewed. Constitutional: She is oriented to person, place, and time. She appears well-developed and well-nourished.  HENT:  Head: Normocephalic and atraumatic.  Eyes: Conjunctivae and EOM are normal. Pupils are equal, round, and reactive to light.  Neck: Normal range of motion. Neck supple.  Cardiovascular: Normal rate, regular rhythm and normal heart sounds.   Pulmonary/Chest: Effort normal and breath sounds normal.  Abdominal: Soft. Bowel sounds are normal.  Musculoskeletal: Normal range of motion.  Neurological: She is alert and oriented to person,  place, and  time.  Skin: Skin is warm and dry.  Psychiatric: She has a normal mood and affect.    ED Course  Procedures (including critical care time)  Labs Reviewed  BASIC METABOLIC PANEL - Abnormal; Notable for the following:    Glucose, Bld 142 (*)    GFR calc non Af Amer 90 (*)    All other components within normal limits  CBC  POCT I-STAT TROPONIN I     No results found. No results found.   No diagnosis found.   Date: 01/19/2013  Rate: 70  Rhythm: Atrial paced rhythm  QRS Axis: left  Intervals: normal  ST/T Wave abnormalities: normal  Conduction Disutrbances:right bundle branch block  Narrative Interpretation:   Old EKG Reviewed: changes noted   MDM  Consult cardiology to evaluate pacemaker defibrillator function.        Donnetta Hutching, MD 01/19/13 1517

## 2013-01-19 NOTE — H&P (Signed)
HPI: 42 year old female with past medical history of hypertrophic obstructive cardiomyopathy, prior myectomy/mitral valve repair, paroxysmal atrial fibrillation, recurrent ventricular fibrillation status post ICD with recurrent ventricular fibrillation and ICD discharges. Last echocardiogram was performed in April of 2014. The patient had septal hypokinesis status post myectomy. Ejection fraction was 40-45%. The patient was status post mitral valve repair with trivial mitral regurgitation. There was moderate to severe left atrial enlargement and mild right atrial enlargement. The patient has done well since last fall. She has mild dyspnea on exertion but no orthopnea, PND, chest pain or syncope. She has occasional mild pedal edema. Approximately one week ago while traveling as a passenger in a car she had sudden dizziness followed by syncope. Her defibrillator fired. She discussed this with the office. She had another episode yesterday morning while walking her dog. She had a third episode this morning but it resolved prior to her defibrillator firing. She therefore presented to the emergency room. She is presently asymptomatic.   (Not in a hospital admission)  Allergies  Allergen Reactions  . Phenergan (Promethazine Hcl) Itching  . Amiodarone Nausea And Vomiting    N/V on IV amio 06/2012  . Warfarin And Related Other (See Comments)    Scars on skin    Past Medical History  Diagnosis Date  . Hypertrophic cardiomyopathy     s/p septal myomectomy with implantable defibrillator 01/27/2012   . Anxiety state, unspecified   . Allergic rhinitis     to dogs  . Asthma   . History of anemia     during pregnancy  . History of chicken pox   . Generalized headaches   . Ocular migraine     no HA  . Back pain     told cracked bone, vicodin didn't help, improved with percocets/muscle relaxants, no eval by prior PCP  . S/P MVR (mitral valve repair) 12/2011    dental ppx needed, rec anticoagulation for  3-6 mo  . DVT (deep venous thrombosis)     Diagnosed 02/13/12  - was placed on Warfarin after cardiac surgery but had skin reaction to warfarin so was changed to Pradaxa so DVT occurred while on Pradaxa although unclear if it occurred during transition  . Hypotension   . Atrial fibrillation     a. On pradaxa.  coumadin necrosis with warfarin;  b. Amio d/c'd 02/2012  . ICD (implantable cardiac defibrillator) in place     02/04/2012, [employer]  . Pericardial effusion   . Chronic combined systolic and diastolic CHF (congestive heart failure)     a. echo 03/19/12: severe asymmetric septal hypertrophy, evidence of upper septal myomectomy, peak LV outflow tract gradient 35, EF 35%,;  b.  Echo 9/13: Severe LVH, EF 30-35%, anteroseptal and inferior HK, LVOT narrow, mild AI, mild to moderate mitral stenosis, severe LAE.  Marland Kitchen Pericardial tamponade 03/14/2012  . 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 01/2012 s/p BSX ICD;  b. 06/2012 Multaq initiated due to recurrent syncope, VT/VF - had severe n/v with IV amio.  . Skin necrosis --COUMADIN   . Chronic chest wall pain     eval by CVTS, stable, rec return to [facility] if continued pain  . Syncope     a. in setting of VT/VF.  Marland Kitchen Hypothyroid     Past Surgical History  Procedure Laterality Date  . Cesarean section  1991  . Induced abortion  1990 and 1993  . Cardiac surgery  01/27/2012    median sternotomy, septal myectomy,  MVR - [facility] (Dr. Silvestre Mesi)  . Cardiac defibrillator placement  01/2012  . Myomectomy    . Pericardial window  03/14/2012    Procedure: PERICARDIAL WINDOW;  Surgeon: Purcell Nails, MD;  Location: [facility] OR;  Service: Open Heart Surgery;  Laterality: N/A;  Subxyphoid Pericardial  Window     History   Social History  . Marital Status: Single    Spouse Name: N/A    Number of Children: 1  . Years of Education: N/A   Occupational History  . Naval architect    Social History Main Topics  . Smoking status: Former Smoker -- 1.00  packs/day for 3 years    Types: Cigarettes    Quit date: 09/30/1988  . Smokeless tobacco: Never Used     Comment: quitin 1990  . Alcohol Use: 0.0 oz/week     Comment: rarely drinks wine  . Drug Use: No     Comment: quiti n 1989  . Sexually Active: Yes    Birth Control/ Protection: Pill   Other Topics Concern  . Not on file   Social History Narrative   Caffeine: 1 cup coffee/day   Divorced, 1 child.  lives with boyfriend, dogs   Occupation: Full time Tax adviser   Activity: walks 19min/day   Diet: fruits/vegetables daily, water, no fish    Family History  Problem Relation Age of Onset  . Diabetes Father   . Heart disease Father     unspecified heart problem  . Thyroid disease Mother   . Anemia Mother     mediterranean anemia, ITP  . Heart disease Maternal Grandfather     CHF  . Diabetes Paternal Grandfather   . Cancer Paternal Grandfather   . Coronary artery disease Neg Hx   . Stroke Neg Hx   . Sudden death Neg Hx     ROS:  no fevers or chills, productive cough, hemoptysis, dysphasia, odynophagia, melena, hematochezia, dysuria, hematuria, rash, seizure activity, orthopnea, PND, pedal edema, claudication. Remaining systems are negative.  Physical Exam:   Blood pressure 104/78, pulse 72, temperature 97.9 F (36.6 C), temperature source Oral, resp. rate 16, last menstrual period 01/12/2013, SpO2 97.00%.  General:  Well developed/well nourished in NAD Skin warm/dry Patient not depressed No peripheral clubbing Back-normal HEENT-normal/normal eyelids Neck supple/normal carotid upstroke bilaterally; no bruits; no JVD; no thyromegaly chest - CTA/ normal expansion, ICD left chest. CV - RRR/normal S1 and S2; no rubs or gallops;  PMI nondisplaced, 2/6 systolic murmur LSB Abdomen -NT/ND, no HSM, no mass, + bowel sounds, no bruit 2+ femoral pulses, no bruits Ext-no edema, chords, 2+ DP Neuro-grossly nonfocal  ECG Atrial paced with left bundle branch  block.  Results for orders placed during the hospital encounter of 01/19/13 (from the past 48 hour(s))  CBC     Status: None   Collection Time    01/19/13 12:38 PM      Result Value Range   WBC 5.7  4.0 - 10.5 K/uL   RBC 4.76  3.87 - 5.11 MIL/uL   Hemoglobin 12.9  12.0 - 15.0 g/dL   HCT 16.1  09.6 - 04.5 %   MCV 80.9  78.0 - 100.0 fL   MCH 27.1  26.0 - 34.0 pg   MCHC 33.5  30.0 - 36.0 g/dL   RDW 40.9  81.1 - 91.4 %   Platelets 259  150 - 400 K/uL  BASIC METABOLIC PANEL     Status: Abnormal   Collection Time  01/19/13 12:38 PM      Result Value Range   Sodium 138  135 - 145 mEq/L   Potassium 4.1  3.5 - 5.1 mEq/L   Chloride 102  96 - 112 mEq/L   CO2 27  19 - 32 mEq/L   Glucose, Bld 142 (*) 70 - 99 mg/dL   BUN 16  6 - 23 mg/dL   Creatinine, Ser 0.45  0.50 - 1.10 mg/dL   Calcium 9.3  8.4 - 40.9 mg/dL   GFR calc non Af Amer 90 (*) >90 mL/min   GFR calc Af Amer >90  >90 mL/min   Comment:            The eGFR has been calculated     using the CKD EPI equation.     This calculation has not been     validated in all clinical     situations.     eGFR's persistently     <90 mL/min signify     possible Chronic Kidney Disease.  POCT I-STAT TROPONIN I     Status: None   Collection Time    01/19/13  1:09 PM      Result Value Range   Troponin i, poc 0.03  0.00 - 0.08 ng/mL   Comment 3            Comment: Due to the release kinetics of cTnI,     a negative result within the first hours     of the onset of symptoms does not rule out     myocardial infarction with certainty.     If myocardial infarction is still suspected,     repeat the test at appropriate intervals.    Dg Chest Portable 1 View  01/19/2013  *RADIOLOGY REPORT*  Clinical Data: Dizziness  PORTABLE CHEST - 1 VIEW  Comparison: Prior chest x-ray and chest CT 11/11/2012  Findings: Stable position of left subclavian approach cardiac rhythm maintenance device with leads projecting over the right atrium and right  ventricle.  Inspiratory volumes are low and there is left greater than right basilar opacity favored to reflect atelectasis.  Cardiomegaly is similar to prior.  Mild vascular congestion without overt edema, unchanged compared to prior.  No acute osseous abnormality.  IMPRESSION:  1.  Lower inspiratory volumes with left greater than right basilar opacities favored to reflect atelectasis. 2.  Cardiomegaly and mild vascular congestion without failure.   Original Report Authenticated By: Malachy Moan, M.D.     Assessment/Plan 1 ventricular fibrillation-the patient has had recurrent ICD discharges. Her device has been interrogated revealing a ventricular fibrillation. We will admit to telemetry. Her potassium is normal. Check magnesium. Continue Toprol. I cannot advance this as her blood pressure is borderline. Continue Multaq. Will review electrophysiology for question adjustment other antiarrhythmics. 2 hypertrophic cardiomyopathy status post myectomy -continue beta blocker. 3 history of paroxysmal atrial fibrillation-patient is in atrial paced rhythm. Continue beta blocker and pradaxa. 4 history of chronic systolic/diastolic congestive heart failure-patient is euvolemic on examination. Continue present dose of diuretic. 5 hypothyroidism-continue Synthroid.   Olga Millers MD 01/19/2013, 4:54 PM

## 2013-01-20 LAB — BASIC METABOLIC PANEL
BUN: 17 mg/dL (ref 6–23)
CO2: 24 mEq/L (ref 19–32)
Chloride: 103 mEq/L (ref 96–112)
GFR calc non Af Amer: 87 mL/min — ABNORMAL LOW (ref >90)
Glucose, Bld: 91 mg/dL (ref 70–99)
Potassium: 4.1 mEq/L (ref 3.5–5.1)
Sodium: 136 mEq/L (ref 135–145)

## 2013-01-20 LAB — CBC
HCT: 37.8 % (ref 36.0–46.0)
Hemoglobin: 12.7 g/dL (ref 12.0–15.0)
MCHC: 33.6 g/dL (ref 30.0–36.0)
RBC: 4.6 MIL/uL (ref 3.87–5.11)
WBC: 4.4 10*3/uL (ref 4.0–10.5)

## 2013-01-20 MED ORDER — POTASSIUM CHLORIDE CRYS ER 10 MEQ PO TBCR
10.0000 meq | EXTENDED_RELEASE_TABLET | Freq: Two times a day (BID) | ORAL | Status: DC
  Administered 2013-01-20 – 2013-01-21 (×3): 10 meq via ORAL
  Filled 2013-01-20 (×4): qty 1

## 2013-01-20 MED ORDER — POTASSIUM CHLORIDE CRYS ER 10 MEQ PO TBCR
10.0000 meq | EXTENDED_RELEASE_TABLET | Freq: Two times a day (BID) | ORAL | Status: DC
  Filled 2013-01-20: qty 1

## 2013-01-20 MED ORDER — FUROSEMIDE 10 MG/ML IJ SOLN
20.0000 mg | Freq: Two times a day (BID) | INTRAMUSCULAR | Status: DC
  Administered 2013-01-20 – 2013-01-21 (×3): 20 mg via INTRAVENOUS
  Filled 2013-01-20 (×4): qty 2

## 2013-01-20 MED ORDER — POTASSIUM CHLORIDE CRYS ER 20 MEQ PO TBCR
20.0000 meq | EXTENDED_RELEASE_TABLET | Freq: Two times a day (BID) | ORAL | Status: DC
  Filled 2013-01-20: qty 1

## 2013-01-20 MED ORDER — FUROSEMIDE 10 MG/ML IJ SOLN: 60.0000 mg | Freq: Two times a day (BID) | INTRAMUSCULAR | Status: DC

## 2013-01-20 MED ORDER — FUROSEMIDE 10 MG/ML IJ SOLN: 20.0000 mg | Freq: Two times a day (BID) | INTRAMUSCULAR | Status: DC

## 2013-01-20 NOTE — Progress Notes (Signed)
Patient's BP=90/52.  I spoke to Dr. Ladona Ridgel, he stated to GIVE patient's 25 mg Toprol XL,  And that he would change IV Lasix to 20 mg BID and K to 10 mg BID.

## 2013-01-20 NOTE — Progress Notes (Signed)
Patient ID: [name], female   DOB: 09/07/1971, 42 y.o.   MRN: [id number] Subjective:  Denies chest pain. Admits to recent sodium indiscretion and stress over the break up of her significant other  Objective:  Vital Signs in the last 24 hours: Temp:  [97.9 F (36.6 C)-98.3 F (36.8 C)] 98.1 F (36.7 C) (04/23 0550) Pulse Rate:  [70-73] 71 (04/23 0550) Resp:  [14-20] 18 (04/23 0550) BP: (88-130)/(55-78) 106/70 mmHg (04/23 0550) SpO2:  [94 %-97 %] 96 % (04/23 0550) Weight:  [198 lb (89.812 kg)] 198 lb (89.812 kg) (04/22 1858)  Intake/Output from previous day:   Intake/Output from this shift: Total I/O In: 120 [P.O.:120] Out: -   Physical Exam: stable appearing NAD HEENT: Unremarkable Neck:  7 cm JVD, no thyromegally Lymphatics:  No adenopathy Back:  No CVA tenderness Lungs:  Clear except for basilar rales HEART:  Regular rate rhythm, no murmurs, no rubs, no clicks Abd:  Flat, positive bowel sounds, no organomegally, no rebound, no guarding Ext:  2 plus pulses, no edema, no cyanosis, no clubbing Skin:  No rashes no nodules Neuro:  CN II through XII intact, motor grossly intact  Lab Results:  Recent Labs  01/19/13 1238 01/20/13 0510  WBC 5.7 4.4  HGB 12.9 12.7  PLT 259 242    Recent Labs  01/19/13 1238 01/20/13 0510  NA 138 136  K 4.1 4.1  CL 102 103  CO2 27 24  GLUCOSE 142* 91  BUN 16 17  CREATININE 0.80 0.82   No results found for this basename: TROPONINI, CK, MB,  in the last 72 hours Hepatic Function Panel No results found for this basename: PROT, ALBUMIN, AST, ALT, ALKPHOS, BILITOT, BILIDIR, IBILI,  in the last 72 hours No results found for this basename: CHOL,  in the last 72 hours No results found for this basename: PROTIME,  in the last 72 hours  Imaging: Dg Chest Portable 1 View  01/19/2013  *RADIOLOGY REPORT*  Clinical Data: Dizziness  PORTABLE CHEST - 1 VIEW  Comparison: Prior chest x-ray and chest CT 11/11/2012  Findings: Stable position of  left subclavian approach cardiac rhythm maintenance device with leads projecting over the right atrium and right ventricle.  Inspiratory volumes are low and there is left greater than right basilar opacity favored to reflect atelectasis.  Cardiomegaly is similar to prior.  Mild vascular congestion without overt edema, unchanged compared to prior.  No acute osseous abnormality.  IMPRESSION:  1.  Lower inspiratory volumes with left greater than right basilar opacities favored to reflect atelectasis. 2.  Cardiomegaly and mild vascular congestion without failure.   Original Report Authenticated By: Malachy Moan, M.D.     Cardiac Studies: Tele - atrial pacing with left bundle branch block Assessment/Plan:  1. Recurrent VT 2. Multiple ICD shocks due to number 1 3. Recent weight gain due to sodium indiscretion 4. Chronic systolic CHF 5. H/o profound nausea on amiodarone Rec: the inciting events leading to her VF episodes appears to be stress from breakup of long term live in boyfriend and sodium indiscretion/weight gain. While she does not have overt CHF and is not in pulmonary edema, I think worsening fluid status is playing a role. Will give IV lasix, continue other meds and supplement potassium. Expect discharge in next 24-48 hours.  Gregg Taylor,M.D.  LOS: 1 day    Lewayne Bunting 01/20/2013, 8:55 AM

## 2013-01-21 DIAGNOSIS — I4901 Ventricular fibrillation: Secondary | ICD-10-CM

## 2013-01-21 LAB — BASIC METABOLIC PANEL
BUN: 18 mg/dL (ref 6–23)
CO2: 27 mEq/L (ref 19–32)
Chloride: 103 mEq/L (ref 96–112)
Creatinine, Ser: 0.87 mg/dL (ref 0.50–1.10)
GFR calc Af Amer: >90 mL/min (ref >90)
Potassium: 3.8 mEq/L (ref 3.5–5.1)

## 2013-01-21 MED ORDER — FUROSEMIDE 20 MG PO TABS: ORAL_TABLET | ORAL | Status: DC

## 2013-01-21 NOTE — Discharge Summary (Signed)
ELECTROPHYSIOLOGY DISCHARGE SUMMARY    Patient ID: Michael Ventresca,  MRN: [id number], DOB/AGE: 1971/04/04 42 y.o.  Admit date: 01/19/2013 Discharge date: 01/21/2013  Primary Care Physician: Rosemarie Beath, MD   Primary Cardiologist: Excell Seltzer, MD Primary EP: Ladona Ridgel, MD  Primary Discharge Diagnosis:  1. VT/VF s/p multiple ICD shocks 2. Prior VF arrest s/p ICD implant  Secondary Discharge Diagnoses:  1. HOCM s/p myomectomy  2. Chronic systolic HF, EF 40-45% 3. PAF 4. H/o profound nausea on amiodarone 5. Asthma 6. H/o DVT 7. Hypothyroidism  Procedures This Admission:  None   History:  Ms. Briddell is a pleasant 42 year old woman with hypertrophic obstructive cardiomyopathy, prior myectomy/mitral valve repair April 2013, paroxysmal atrial fibrillation, recurrent ventricular fibrillation status post ICD with recurrent ventricular fibrillation and ICD discharges. Last echocardiogram was performed in April of 2014. She had septal hypokinesis status post myectomy. Ejection fraction was 40-45%. The patient was status post mitral valve repair with trivial mitral regurgitation. There was moderate to severe left atrial enlargement and mild right atrial enlargement. She reports she has done well since last fall. She has mild dyspnea on exertion but no orthopnea, PND, chest pain or syncope. She has occasional mild pedal edema. Approximately one week ago while traveling as a passenger in a car she had the sudden onset of dizziness followed by syncope. Her defibrillator fired. She discussed this with the office. She had another episode 01/19/2013 while walking her dog which prompted her to seek medical attention.   Hospital Course: While in the ED, her ICD was interrogated revealing VF s/p ICD shocks. Her potassium and magnesium levels were within norma range. She was admitted for further evaluation and treatment. She admitted to dietary indiscretion and has been eating salty and canned food. She was  diuresed with IV Lasix. She was continued on Multaq (has history of profound nausea with amiodarone) and Toprol XL. CEs were negative. She has not had any further VT/VF. She remains hemodynamically stable. She has been seen, examined and deemed stable for DC home today by Dr. Lewayne Bunting. She was again counseled regarding the importance of compliance. She will follow-up with Dr. Ladona Ridgel in 3-4 weeks. She was instructed not to drive until given clearance by Dr. Lewayne Bunting.  Discharge Vitals: Blood pressure 109/72, pulse 69, temperature 98.5 F (36.9 C), temperature source Oral, resp. rate 18, height 5' (1.524 m), weight 196 lb 9.6 oz (89.177 kg), last menstrual period 01/12/2013, SpO2 95.00%.   Labs: Lab Results  Component Value Date   WBC 4.4 01/20/2013   HGB 12.7 01/20/2013   HCT 37.8 01/20/2013   MCV 82.2 01/20/2013   PLT 242 01/20/2013     Recent Labs Lab 01/21/13 0545  NA 139  K 3.8  CL 103  CO2 27  BUN 18  CREATININE 0.87  CALCIUM 9.4  GLUCOSE 91   Lab Results  Component Value Date   CKTOTAL 77 03/10/2012   CKMB 14.6* 03/10/2012   TROPONINI <0.30 10/05/2012     Disposition:  The patient is being discharged in stable condition.  Follow-up:     Follow-up Information   Follow up with Lewayne Bunting, MD On 02/11/2013. (At 10:15 AM for hospital follow-up)    Contact information:   [address] [place] [address] [phone]     Discharge Medications:    Medication List    TAKE these medications       ALPRAZolam 1 MG tablet  Commonly known as:  XANAX  Take  1 mg by mouth at bedtime as needed for sleep.     beclomethasone 80 MCG/ACT inhaler  Commonly known as:  QVAR  Inhale 1 puff into the lungs 2 (two) times daily.     cetirizine 10 MG tablet  Commonly known as:  ZYRTEC  Take 10 mg by mouth daily.     dabigatran 150 MG Caps  Commonly known as:  PRADAXA  Take 1 capsule (150 mg total) by mouth every 12 (twelve) hours.     dronedarone 400  MG tablet  Commonly known as:  MULTAQ  Take 1 tablet (400 mg total) by mouth 2 (two) times daily with a meal.     fexofenadine 180 MG tablet  Commonly known as:  ALLEGRA  Take 180 mg by mouth daily.     furosemide 20 MG tablet  Commonly known as:  LASIX  Take 2 tablets (40 mg) once daily x 2 days then resume 1 tablet (20 mg) once daily.     metoprolol succinate 25 MG 24 hr tablet  Commonly known as:  TOPROL XL  Take 1 tablet (25 mg total) by mouth daily.     oxyCODONE-acetaminophen 5-325 MG per tablet  Commonly known as:  PERCOCET/ROXICET  Take 1 tablet by mouth every 6 (six) hours as needed for pain.     Potassium Gluconate 550 MG Tabs  Take 1 tablet by mouth daily.     SYNTHROID 50 MCG tablet  Generic drug:  levothyroxine  Take 50 mcg by mouth every evening.       Duration of Discharge Encounter: Greater than 30 minutes including physician time.  Signed, Rick Duff, PA-C 01/21/2013, 3:36 PM

## 2013-01-21 NOTE — Progress Notes (Signed)
Patient: [name] Date of Encounter: 01/21/2013, 11:42 AM Admit date: 01/19/2013     Subjective  [name] denies any complaints. She denies CP or SOB. She is eager to go home.   Objective  Physical Exam: Vitals: BP 109/72  Pulse 69  Temp(Src) 98.5 F (36.9 C) (Oral)  Resp 18  Ht 5' (1.524 m)  Wt 196 lb 9.6 oz (89.177 kg)  BMI 38.4 kg/m2  SpO2 95%  LMP 01/12/2013 General: Well developed, well appearing 42 year old female in no acute distress. Neck: Supple. JVD not elevated. Lungs: Clear bilaterally to auscultation without wheezes, rales, or rhonchi. Breathing is unlabored. Heart: RRR S1 S2 without murmurs, rubs, or gallops.  Abdomen: Soft, non-distended. Extremities: No clubbing or cyanosis. No edema.  Distal pedal pulses are 2+ and equal bilaterally. Neuro: Alert and oriented X 3. Moves all extremities spontaneously. No focal deficits.  Intake/Output:  Intake/Output Summary (Last 24 hours) at 01/21/13 1142 Last data filed at 01/21/13 0800  Gross per 24 hour  Intake    840 ml  Output   1300 ml  Net   -460 ml    Inpatient Medications:  . dabigatran  150 mg Oral Q12H  . dronedarone  400 mg Oral BID WC  . fluticasone  1 puff Inhalation BID  . furosemide  20 mg Intravenous BID  . levothyroxine  50 mcg Oral QPM  . loratadine  10 mg Oral Daily  . metoprolol succinate  25 mg Oral Daily  . potassium chloride  10 mEq Oral BID  . sodium chloride  3 mL Intravenous Q12H    Labs:  Recent Labs  01/19/13 1956 01/20/13 0510 01/21/13 0545  NA  --  136 139  K  --  4.1 3.8  CL  --  103 103  CO2  --  24 27  GLUCOSE  --  91 91  BUN  --  17 18  CREATININE  --  0.82 0.87  CALCIUM  --  9.0 9.4  MG 2.1  --   --     Recent Labs  01/19/13 1238 01/20/13 0510  WBC 5.7 4.4  HGB 12.9 12.7  HCT 38.5 37.8  MCV 80.9 82.2  PLT 259 242    Recent Labs  01/19/13 1956  TSH 3.840    Radiology/Studies: Dg Chest Portable 1 View  01/19/2013  *RADIOLOGY REPORT*   Clinical Data: Dizziness  PORTABLE CHEST - 1 VIEW  Comparison: Prior chest x-ray and chest CT 11/11/2012  Findings: Stable position of left subclavian approach cardiac rhythm maintenance device with leads projecting over the right atrium and right ventricle.  Inspiratory volumes are low and there is left greater than right basilar opacity favored to reflect atelectasis.  Cardiomegaly is similar to prior.  Mild vascular congestion without overt edema, unchanged compared to prior.  No acute osseous abnormality.  IMPRESSION:  1.  Lower inspiratory volumes with left greater than right basilar opacities favored to reflect atelectasis. 2.  Cardiomegaly and mild vascular congestion without failure.   Original Report Authenticated By: Malachy Moan, M.D.     Telemetry: A paced V sensed at 70 bpm; no VT/VF    Assessment and Plan  1. Recurrent VT 2. Multiple ICD shocks due to #1  3. Recent weight gain due to sodium indiscretion  4. Chronic systolic CHF  5. H/o profound nausea on amiodarone, now on Multaq 6. HOCM s/p myomectomy April 2013 7. PAF, on BB and Pradaxa  Dr. Ladona Ridgel  to see Signed, EDMISTEN, BROOKE PA-C  EP Patient seen and examined. She has had a modest diuresis and feels well. She will be discharged home this afternoon and will need early followup. She has been instructed to take an extra lasix for the next 2 days and supplement her potassium with an extra banana a day.   [name].D.

## 2013-01-26 ENCOUNTER — Telehealth: Payer: Self-pay | Admitting: *Deleted

## 2013-01-26 NOTE — Telephone Encounter (Signed)
TCM phone call. Called patient to check her status. No episodes of AICD discharges. Has occ dizziness when ambulating in her home.Advised we can check orthostatic BP's when she comes in for her post hospital visit with Dr.Taylor on 5/15. She is aware that she is still on a driving restriction. Will call back with any questions if needed.

## 2013-02-02 ENCOUNTER — Telehealth: Payer: Self-pay | Admitting: Internal Medicine

## 2013-02-02 NOTE — Telephone Encounter (Signed)
nEW pROB     Requesting samples of PRADAXA and MULTAQ. Would like to speak to nurse.

## 2013-02-02 NOTE — Telephone Encounter (Signed)
Pt switching ins and will get meds covered but needs 3-4 weeks of samples. Pt will pick up at next o/v with Dr Ladona Ridgel. Placed at front desk and pt is aware

## 2013-02-11 ENCOUNTER — Encounter: Payer: Self-pay | Admitting: Internal Medicine

## 2013-02-11 ENCOUNTER — Ambulatory Visit (INDEPENDENT_AMBULATORY_CARE_PROVIDER_SITE_OTHER): Payer: BC Managed Care – PPO | Admitting: Internal Medicine

## 2013-02-11 VITALS — BP 90/60 | HR 70 | Ht 60.0 in | Wt 199.8 lb

## 2013-02-11 DIAGNOSIS — I5042 Chronic combined systolic (congestive) and diastolic (congestive) heart failure: Secondary | ICD-10-CM

## 2013-02-11 DIAGNOSIS — Z95810 Presence of automatic (implantable) cardiac defibrillator: Secondary | ICD-10-CM

## 2013-02-11 DIAGNOSIS — I472 Ventricular tachycardia: Secondary | ICD-10-CM

## 2013-02-11 DIAGNOSIS — I4901 Ventricular fibrillation: Secondary | ICD-10-CM

## 2013-02-11 DIAGNOSIS — I509 Heart failure, unspecified: Secondary | ICD-10-CM

## 2013-02-11 LAB — ICD DEVICE OBSERVATION
AL AMPLITUDE: 6.4 mv
AL THRESHOLD: 0.7 V
DEV-0020ICD: NEGATIVE
HV IMPEDENCE: 51 Ohm
RV LEAD AMPLITUDE: 14.7 mv
TZON-0003FASTVT: 352.9 ms
VENTRICULAR PACING ICD: 3 pct

## 2013-02-11 NOTE — Assessment & Plan Note (Signed)
Her [employer] ICD is working normally. We'll plan to recheck in several months. 

## 2013-02-11 NOTE — Assessment & Plan Note (Signed)
She will continue her current antiarrhythmic drug therapy. Additional options are limited. If she has recurrent ventricular arrhythmias, we would consider catheter ablation.

## 2013-02-11 NOTE — Patient Instructions (Addendum)
Your physician recommends that you schedule a follow-up appointment in: 3 months with Dr Taylor  

## 2013-02-11 NOTE — Progress Notes (Signed)
HPI [name] returns today for followup. She is a very pleasant 42 year old woman with a hypertrophic cardiomyopathy and is status post myomectomy and mitral valve repair. Postoperatively she had a pericardial effusion and ventricular tachycardia. She has been refractory to medical therapy. Her VT has been intermittently controlled. She denies syncope. Since discharge from the hospital, she has had mild peripheral edema and dizziness. Her blood pressure, particularly in the mornings, has run low. She has chronic class II heart failure symptoms. Allergies  Allergen Reactions  . Phenergan (Promethazine Hcl) Itching  . Amiodarone Nausea And Vomiting    N/V on IV amio 06/2012  . Warfarin And Related Other (See Comments)    Scars on skin     Current Outpatient Prescriptions  Medication Sig Dispense Refill  . ALPRAZolam (XANAX) 1 MG tablet Take 1 mg by mouth at bedtime as needed for sleep.      . beclomethasone (QVAR) 80 MCG/ACT inhaler Inhale 1 puff into the lungs 2 (two) times daily.      . cetirizine (ZYRTEC) 10 MG tablet Take 10 mg by mouth daily.        . dabigatran (PRADAXA) 150 MG CAPS Take 1 capsule (150 mg total) by mouth every 12 (twelve) hours.  60 capsule  6  . dronedarone (MULTAQ) 400 MG tablet Take 1 tablet (400 mg total) by mouth 2 (two) times daily with a meal.  60 tablet  6  . fexofenadine (ALLEGRA) 180 MG tablet Take 180 mg by mouth daily.      . furosemide (LASIX) 20 MG tablet Take 2 tablets (40 mg) once daily x 2 days then resume 1 tablet (20 mg) once daily.  30 tablet    . levothyroxine (SYNTHROID) 50 MCG tablet Take 50 mcg by mouth every evening.       . metoprolol succinate (TOPROL XL) 25 MG 24 hr tablet Take 1 tablet (25 mg total) by mouth daily.  30 tablet  11  . oxyCODONE-acetaminophen (PERCOCET/ROXICET) 5-325 MG per tablet Take 1 tablet by mouth every 6 (six) hours as needed for pain.  15 tablet  0  . Potassium Gluconate 550 MG TABS Take 1 tablet by mouth daily.         No current facility-administered medications for this visit.     Past Medical History  Diagnosis Date  . Hypertrophic cardiomyopathy     s/p septal myomectomy with implantable defibrillator 01/27/2012   . Anxiety state, unspecified   . Allergic rhinitis     to dogs  . Asthma   . History of anemia     during pregnancy  . History of chicken pox   . Generalized headaches   . Ocular migraine     no HA  . Back pain     told cracked bone, vicodin didn't help, improved with percocets/muscle relaxants, no eval by prior PCP  . 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 DVT (deep venous thrombosis)     Diagnosed 02/13/12  - was placed on Warfarin after cardiac surgery but had skin reaction to warfarin so was changed to Pradaxa so DVT occurred while on Pradaxa although unclear if it occurred during transition  . Hypotension   . Atrial fibrillation     a. On pradaxa.  coumadin necrosis with warfarin;  b. Amio d/c'd 02/2012  . ICD (implantable cardiac defibrillator) in place     02/04/2012, [employer]  . Pericardial  effusion   . Chronic combined systolic and diastolic CHF (congestive heart failure)     a. echo 03/19/12: severe asymmetric septal hypertrophy, evidence of upper septal myomectomy, peak LV outflow tract gradient 35, EF 35%,;  b.  Echo 9/13: Severe LVH, EF 30-35%, anteroseptal and inferior HK, LVOT narrow, mild AI, mild to moderate mitral stenosis, severe LAE.  Marland Kitchen Pericardial tamponade 03/14/2012  . 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 01/2012 s/p BSX ICD;  b. 06/2012 Multaq initiated due to recurrent syncope, VT/VF - had severe n/v with IV amio.  . Skin necrosis --COUMADIN   . Chronic chest wall pain     eval by CVTS, stable, rec return to [facility] if continued pain  . Syncope     a. in setting of VT/VF.  Marland Kitchen Hypothyroid     ROS:   All systems reviewed and negative except as noted in the HPI.   Past Surgical History   Procedure Laterality Date  . Cesarean section  1991  . Induced abortion  1990 and 1993  . Cardiac surgery  01/27/2012    median sternotomy, septal myectomy, MVR - [facility] (Dr. Silvestre Mesi)  . Cardiac defibrillator placement  01/2012  . Myomectomy    . Pericardial window  03/14/2012    Procedure: PERICARDIAL WINDOW;  Surgeon: Purcell Nails, MD;  Location: [facility] OR;  Service: Open Heart Surgery;  Laterality: N/A;  Subxyphoid Pericardial  Window      Family History  Problem Relation Age of Onset  . Diabetes Father   . Heart disease Father     unspecified heart problem  . Thyroid disease Mother   . Anemia Mother     mediterranean anemia, ITP  . Heart disease Maternal Grandfather     CHF  . Diabetes Paternal Grandfather   . Cancer Paternal Grandfather   . Coronary artery disease Neg Hx   . Stroke Neg Hx   . Sudden death Neg Hx      History   Social History  . Marital Status: Single    Spouse Name: N/A    Number of Children: 1  . Years of Education: N/A   Occupational History  . Naval architect    Social History Main Topics  . Smoking status: Former Smoker -- 1.00 packs/day for 3 years    Types: Cigarettes    Quit date: 09/30/1988  . Smokeless tobacco: Never Used     Comment: quitin 1990  . Alcohol Use: 0.0 oz/week     Comment: rarely drinks wine  . Drug Use: No     Comment: quiti n 1989  . Sexually Active: Yes    Birth Control/ Protection: Pill   Other Topics Concern  . Not on file   Social History Narrative   Caffeine: 1 cup coffee/day   Divorced, 1 child.  lives with boyfriend, dogs   Occupation: Full time Tax adviser   Activity: walks 27min/day   Diet: fruits/vegetables daily, water, no fish     BP 90/60  Pulse 70  Ht 5' (1.524 m)  Wt 199 lb 12.8 oz (90.629 kg)  BMI 39.02 kg/m2  SpO2 98%  LMP 01/12/2013  Physical Exam:  Well appearing 42 year old woman, NAD HEENT: Unremarkable Neck:  6 cm JVD, no thyromegally Back:  No CVA  tenderness Lungs:  Clear with no wheezes, rales, or rhonchi. HEART:  Regular rate rhythm, no murmurs, no rubs, no clicks Abd:  soft, positive bowel sounds, no organomegally, no rebound, no  guarding Ext:  2 plus pulses, no edema, no cyanosis, no clubbing Skin:  No rashes no nodules Neuro:  CN II through XII intact, motor grossly intact   DEVICE  Normal device function.  See PaceArt for details. No intermittent ventricular arrhythmias  Assess/Plan:

## 2013-02-11 NOTE — Assessment & Plan Note (Signed)
Her heart failure symptoms are class III. She is not volume overloaded. On the contrary, her systolic blood pressures are ranging between 90 and 95 mmHg. With upright standing, she drops into the 80s. We discussed whether or reducing her Lasix was worthwhile. I've asked the patient to reduce her sodium intake, and reduce her diuretic dose to 20-40 mg daily. If her blood pressure goes up, and her shortness of breath worsens or peripheral edema increases, she will up titrate her diuretic therapy.

## 2013-02-28 ENCOUNTER — Encounter (HOSPITAL_COMMUNITY): Payer: Self-pay | Admitting: Emergency Medicine

## 2013-02-28 ENCOUNTER — Inpatient Hospital Stay (HOSPITAL_COMMUNITY)
Admission: EM | Admit: 2013-02-28 | Discharge: 2013-02-28 | DRG: 544 | Payer: BC Managed Care – PPO | Attending: Cardiology | Admitting: Cardiology

## 2013-02-28 ENCOUNTER — Emergency Department (HOSPITAL_COMMUNITY): Payer: BC Managed Care – PPO

## 2013-02-28 DIAGNOSIS — I422 Other hypertrophic cardiomyopathy: Secondary | ICD-10-CM | POA: Diagnosis present

## 2013-02-28 DIAGNOSIS — J45909 Unspecified asthma, uncomplicated: Secondary | ICD-10-CM | POA: Diagnosis present

## 2013-02-28 DIAGNOSIS — I4729 Other ventricular tachycardia: Secondary | ICD-10-CM | POA: Diagnosis present

## 2013-02-28 DIAGNOSIS — Z7901 Long term (current) use of anticoagulants: Secondary | ICD-10-CM

## 2013-02-28 DIAGNOSIS — F411 Generalized anxiety disorder: Secondary | ICD-10-CM | POA: Diagnosis present

## 2013-02-28 DIAGNOSIS — Z8249 Family history of ischemic heart disease and other diseases of the circulatory system: Secondary | ICD-10-CM

## 2013-02-28 DIAGNOSIS — I5042 Chronic combined systolic (congestive) and diastolic (congestive) heart failure: Secondary | ICD-10-CM | POA: Diagnosis present

## 2013-02-28 DIAGNOSIS — R404 Transient alteration of awareness: Secondary | ICD-10-CM | POA: Diagnosis present

## 2013-02-28 DIAGNOSIS — Z95810 Presence of automatic (implantable) cardiac defibrillator: Secondary | ICD-10-CM

## 2013-02-28 DIAGNOSIS — Z952 Presence of prosthetic heart valve: Secondary | ICD-10-CM

## 2013-02-28 DIAGNOSIS — Z86718 Personal history of other venous thrombosis and embolism: Secondary | ICD-10-CM

## 2013-02-28 DIAGNOSIS — I4891 Unspecified atrial fibrillation: Secondary | ICD-10-CM | POA: Diagnosis present

## 2013-02-28 DIAGNOSIS — I959 Hypotension, unspecified: Secondary | ICD-10-CM | POA: Diagnosis present

## 2013-02-28 DIAGNOSIS — R55 Syncope and collapse: Secondary | ICD-10-CM

## 2013-02-28 DIAGNOSIS — Z87891 Personal history of nicotine dependence: Secondary | ICD-10-CM

## 2013-02-28 DIAGNOSIS — I472 Ventricular tachycardia, unspecified: Secondary | ICD-10-CM | POA: Diagnosis present

## 2013-02-28 DIAGNOSIS — E876 Hypokalemia: Secondary | ICD-10-CM | POA: Diagnosis present

## 2013-02-28 DIAGNOSIS — Z823 Family history of stroke: Secondary | ICD-10-CM

## 2013-02-28 DIAGNOSIS — F418 Other specified anxiety disorders: Secondary | ICD-10-CM | POA: Diagnosis present

## 2013-02-28 DIAGNOSIS — I4901 Ventricular fibrillation: Principal | ICD-10-CM

## 2013-02-28 DIAGNOSIS — Z832 Family history of diseases of the blood and blood-forming organs and certain disorders involving the immune mechanism: Secondary | ICD-10-CM

## 2013-02-28 DIAGNOSIS — E039 Hypothyroidism, unspecified: Secondary | ICD-10-CM | POA: Diagnosis present

## 2013-02-28 DIAGNOSIS — M549 Dorsalgia, unspecified: Secondary | ICD-10-CM | POA: Diagnosis present

## 2013-02-28 DIAGNOSIS — I509 Heart failure, unspecified: Secondary | ICD-10-CM | POA: Diagnosis present

## 2013-02-28 LAB — MAGNESIUM: Magnesium: 3.1 mg/dL — ABNORMAL HIGH (ref 1.5–2.5)

## 2013-02-28 LAB — POCT I-STAT, CHEM 8
Calcium, Ion: 1.14 mmol/L (ref 1.12–1.23)
Chloride: 105 mEq/L (ref 96–112)
HCT: 43 % (ref 36.0–46.0)
Potassium: 3.4 mEq/L — ABNORMAL LOW (ref 3.5–5.1)
Sodium: 139 mEq/L (ref 135–145)

## 2013-02-28 LAB — CBC
HCT: 40.9 % (ref 36.0–46.0)
MCHC: 34.5 g/dL (ref 30.0–36.0)
MCV: 82.1 fL (ref 78.0–100.0)
Platelets: 257 10*3/uL (ref 150–400)
RDW: 14 % (ref 11.5–15.5)

## 2013-02-28 LAB — MRSA PCR SCREENING: MRSA by PCR: NEGATIVE

## 2013-02-28 LAB — GLUCOSE, CAPILLARY: Glucose-Capillary: 146 mg/dL — ABNORMAL HIGH (ref 70–99)

## 2013-02-28 MED ORDER — SODIUM CHLORIDE 0.9 % IV BOLUS (SEPSIS)
500.0000 mL | Freq: Once | INTRAVENOUS | Status: AC
  Administered 2013-02-28: 500 mL via INTRAVENOUS

## 2013-02-28 MED ORDER — FUROSEMIDE 20 MG PO TABS
20.0000 mg | ORAL_TABLET | Freq: Every day | ORAL | Status: DC
Start: 2013-02-28 — End: 2013-03-01

## 2013-02-28 MED ORDER — METOPROLOL SUCCINATE ER 25 MG PO TB24: 25.0000 mg | ORAL_TABLET | Freq: Every day | ORAL | Status: DC

## 2013-02-28 MED ORDER — ZOLPIDEM TARTRATE 5 MG PO TABS: 5.0000 mg | ORAL_TABLET | Freq: Every evening | ORAL | Status: DC | PRN

## 2013-02-28 MED ORDER — SODIUM CHLORIDE 0.9 % IV BOLUS (SEPSIS)
1000.0000 mL | Freq: Once | INTRAVENOUS | Status: DC
  Administered 2013-02-28: 1000 mL via INTRAVENOUS

## 2013-02-28 MED ORDER — OXYCODONE-ACETAMINOPHEN 5-325 MG PO TABS: 1.0000 | ORAL_TABLET | Freq: Three times a day (TID) | ORAL | Status: DC | PRN

## 2013-02-28 MED ORDER — ALPRAZOLAM 0.5 MG PO TABS
1.0000 mg | ORAL_TABLET | Freq: Every evening | ORAL | Status: DC | PRN
  Administered 2013-02-28: 1 mg via ORAL
  Filled 2013-02-28: qty 2

## 2013-02-28 MED ORDER — POTASSIUM CHLORIDE CRYS ER 20 MEQ PO TBCR
40.0000 meq | EXTENDED_RELEASE_TABLET | Freq: Once | ORAL | Status: AC
  Administered 2013-02-28: 40 meq via ORAL
  Filled 2013-02-28: qty 2

## 2013-02-28 MED ORDER — MAGNESIUM SULFATE 40 MG/ML IJ SOLN
2.0000 g | Freq: Once | INTRAMUSCULAR | Status: AC
  Administered 2013-02-28: 2 g via INTRAVENOUS
  Filled 2013-02-28: qty 50

## 2013-02-28 MED ORDER — METOPROLOL SUCCINATE ER 25 MG PO TB24
25.0000 mg | ORAL_TABLET | Freq: Every day | ORAL | Status: DC
  Administered 2013-02-28: 25 mg via ORAL
  Filled 2013-02-28: qty 1

## 2013-02-28 MED ORDER — LORATADINE 10 MG PO TABS: 10.0000 mg | ORAL_TABLET | Freq: Every day | ORAL | Status: DC

## 2013-02-28 MED ORDER — DRONEDARONE HCL 400 MG PO TABS
400.0000 mg | ORAL_TABLET | Freq: Two times a day (BID) | ORAL | Status: DC
  Administered 2013-02-28: 400 mg via ORAL
  Filled 2013-02-28: qty 1

## 2013-02-28 MED ORDER — ONDANSETRON HCL 4 MG/2ML IJ SOLN: 4.0000 mg | Freq: Four times a day (QID) | INTRAMUSCULAR | Status: DC | PRN

## 2013-02-28 MED ORDER — NITROGLYCERIN 0.4 MG SL SUBL: 0.4000 mg | SUBLINGUAL_TABLET | SUBLINGUAL | Status: DC | PRN

## 2013-02-28 MED ORDER — DRONEDARONE HCL 400 MG PO TABS
400.0000 mg | ORAL_TABLET | Freq: Two times a day (BID) | ORAL | Status: DC
  Filled 2013-02-28 (×2): qty 1

## 2013-02-28 MED ORDER — LEVOTHYROXINE SODIUM 50 MCG PO TABS: 50.0000 ug | ORAL_TABLET | Freq: Every evening | ORAL | Status: DC

## 2013-02-28 MED ORDER — ACETAMINOPHEN 325 MG PO TABS: 650.0000 mg | ORAL_TABLET | ORAL | Status: DC | PRN

## 2013-02-28 MED ORDER — DABIGATRAN ETEXILATE MESYLATE 150 MG PO CAPS
150.0000 mg | ORAL_CAPSULE | Freq: Two times a day (BID) | ORAL | Status: DC
Start: 2013-02-28 — End: 2013-03-01
  Administered 2013-02-28: 150 mg via ORAL
  Filled 2013-02-28 (×2): qty 1

## 2013-02-28 MED ORDER — FLUTICASONE PROPIONATE HFA 44 MCG/ACT IN AERO
1.0000 | INHALATION_SPRAY | Freq: Two times a day (BID) | RESPIRATORY_TRACT | Status: DC
  Administered 2013-02-28: 1 via RESPIRATORY_TRACT
  Filled 2013-02-28: qty 10.6

## 2013-02-28 NOTE — ED Provider Notes (Signed)
History     CSN: [id number]  Arrival date & time 02/28/13  1404   First MD Initiated Contact with Patient 02/28/13 1422      Chief Complaint  Patient presents with  . Loss of Consciousness  . Dizziness    (Consider location/radiation/quality/duration/timing/severity/associated sxs/prior treatment) Patient is a 42 y.o. female presenting with syncope.  Loss of Consciousness  Patient suffered syncopal event today while seated. She been having intermittent late episodes this morning. Reports that she gets lightheaded almost daily. She reports that when she suffers a syncopal event her defibrillator has fired. She states that her defibrillator has never fired while she was conscious. She is presently asymptomatic. No treatment prior to coming here. Past Medical History  Diagnosis Date  . Hypertrophic cardiomyopathy     s/p septal myomectomy with implantable defibrillator 01/27/2012   . Anxiety state, unspecified   . Allergic rhinitis     to dogs  . Asthma   . History of anemia     during pregnancy  . History of chicken pox   . Generalized headaches   . Ocular migraine     no HA  . Back pain     told cracked bone, vicodin didn't help, improved with percocets/muscle relaxants, no eval by prior PCP  . 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 DVT (deep venous thrombosis)     Diagnosed 02/13/12  - was placed on Warfarin after cardiac surgery but had skin reaction to warfarin so was changed to Pradaxa so DVT occurred while on Pradaxa although unclear if it occurred during transition  . Hypotension   . Atrial fibrillation     a. On pradaxa.  coumadin necrosis with warfarin;  b. Amio d/c'd 02/2012  . ICD (implantable cardiac defibrillator) in place     02/04/2012, [employer]  . Pericardial effusion   . Chronic combined systolic and diastolic CHF (congestive heart failure)     a. echo 03/19/12: severe asymmetric septal hypertrophy,  evidence of upper septal myomectomy, peak LV outflow tract gradient 35, EF 35%,;  b.  Echo 9/13: Severe LVH, EF 30-35%, anteroseptal and inferior HK, LVOT narrow, mild AI, mild to moderate mitral stenosis, severe LAE.  Marland Kitchen Pericardial tamponade 03/14/2012  . 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 01/2012 s/p BSX ICD;  b. 06/2012 Multaq initiated due to recurrent syncope, VT/VF - had severe n/v with IV amio.  . Skin necrosis --COUMADIN   . Chronic chest wall pain     eval by CVTS, stable, rec return to [facility] if continued pain  . Syncope     a. in setting of VT/VF.  Marland Kitchen Hypothyroid     Past Surgical History  Procedure Laterality Date  . Cesarean section  1991  . Induced abortion  1990 and 1993  . Cardiac surgery  01/27/2012    median sternotomy, septal myectomy, MVR - [facility] (Dr. Silvestre Mesi)  . Cardiac defibrillator placement  01/2012  . Myomectomy    . Pericardial window  03/14/2012    Procedure: PERICARDIAL WINDOW;  Surgeon: Purcell Nails, MD;  Location: [facility] OR;  Service: Open Heart Surgery;  Laterality: N/A;  Subxyphoid Pericardial  Window     Family History  Problem Relation Age of Onset  . Diabetes Father   . Heart disease Father     unspecified heart problem  . Thyroid disease Mother   . Anemia Mother  mediterranean anemia, ITP  . Heart disease Maternal Grandfather     CHF  . Diabetes Paternal Grandfather   . Cancer Paternal Grandfather   . Coronary artery disease Neg Hx   . Stroke Neg Hx   . Sudden death Neg Hx     History  Substance Use Topics  . Smoking status: Former Smoker -- 1.00 packs/day for 3 years    Types: Cigarettes    Quit date: 09/30/1988  . Smokeless tobacco: Never Used     Comment: quitin 1990  . Alcohol Use: No     Comment: none since "months" as of 02/28/2013    OB History   Grav Para Term Preterm Abortions TAB SAB Ect Mult Living                  Review of Systems  Cardiovascular: Positive for syncope.  but was or  Allergies  Phenergan;  Amiodarone; and Warfarin and related  Home Medications   Current Outpatient Rx  Name  Route  Sig  Dispense  Refill  . ALPRAZolam (XANAX) 1 MG tablet   Oral   Take 1 mg by mouth at bedtime as needed for sleep.         . beclomethasone (QVAR) 80 MCG/ACT inhaler   Inhalation   Inhale 1 puff into the lungs 2 (two) times daily.         . cetirizine (ZYRTEC) 10 MG tablet   Oral   Take 10 mg by mouth daily.           . dabigatran (PRADAXA) 150 MG CAPS   Oral   Take 1 capsule (150 mg total) by mouth every 12 (twelve) hours.   60 capsule   6   . dronedarone (MULTAQ) 400 MG tablet   Oral   Take 1 tablet (400 mg total) by mouth 2 (two) times daily with a meal.   60 tablet   6   . furosemide (LASIX) 20 MG tablet      Take 2 tablets (40 mg) once daily x 2 days then resume 1 tablet (20 mg) once daily.   30 tablet      . levothyroxine (SYNTHROID) 50 MCG tablet   Oral   Take 50 mcg by mouth every evening.          . metoprolol succinate (TOPROL XL) 25 MG 24 hr tablet   Oral   Take 1 tablet (25 mg total) by mouth daily.   30 tablet   11   . oxyCODONE-acetaminophen (PERCOCET/ROXICET) 5-325 MG per tablet   Oral   Take 1 tablet by mouth every 8 (eight) hours as needed for pain.         Marland Kitchen Potassium Gluconate 550 MG TABS   Oral   Take 1 tablet by mouth daily.            BP 98/58  Pulse 70  Temp(Src) 99.2 F (37.3 C) (Oral)  Resp 17  SpO2 98%  LMP 02/17/2013  Physical Exam  Nursing note and vitals reviewed. Constitutional: She appears well-developed and well-nourished.  HENT:  Head: Normocephalic and atraumatic.  Eyes: Conjunctivae are normal. Pupils are equal, round, and reactive to light.  Neck: Neck supple. No tracheal deviation present. No thyromegaly present.  Cardiovascular: Normal rate and regular rhythm.   No murmur heard. Pulmonary/Chest: Effort normal and breath sounds normal.  Abdominal: Soft. Bowel sounds are normal. She exhibits no  distension. There is no tenderness.  Musculoskeletal: Normal range  of motion. She exhibits no edema and no tenderness.  Neurological: She is alert. Coordination normal.  Skin: Skin is warm and dry. No rash noted.  Psychiatric: She has a normal mood and affect.    ED Course  Procedures (including critical care time)  Labs Reviewed  GLUCOSE, CAPILLARY - Abnormal; Notable for the following:    Glucose-Capillary 146 (*)    All other components within normal limits  POCT I-STAT, CHEM 8 - Abnormal; Notable for the following:    Potassium 3.4 (*)    Glucose, Bld 149 (*)    All other components within normal limits  CBC   Dg Chest Port 1 View  02/28/2013   *RADIOLOGY REPORT*  Clinical Data: Syncope  PORTABLE CHEST - 1 VIEW  Comparison: 01/19/2013  Findings: Left subclavian AICD stable.  Previous median sternotomy. Mild cardiomegaly.  Lungs clear.  No effusion.  IMPRESSION:  Stable cardiomegaly and postop changes.  No acute disease.   Original Report Authenticated By: D. Andria Rhein, MD    Date: 02/28/2013  Rate: 80  Rhythm: Electronically paced  QRS Axis: left  Intervals: normal  ST/T Wave abnormalities: nonspecific ST/T changes  Conduction Disutrbances:nonspecific intraventricular conduction delay  Narrative Interpretation:   Old EKG Reviewed: No significant change from 01/19/2013 interpreted by me   No diagnosis found.  Representative from [employer] came to [place]. Patient has been having runs of ventricular fibrillation lasting several seconds and was defibrillated one time today.  MDM  Case discussed with Dr. Shirlee Latch who will evaluate patient in the emergency department.patient is noted around blood pressure of 80-90 systolicAt baseline Diagnosis #1 syncope #2 ventricular fibrillation         Doug Sou, MD 02/28/13 1603

## 2013-02-28 NOTE — ED Notes (Signed)
Pt reports woke up today with dizziness. Pt rested for awhile, went to get up to eat. Pt reports increased feeling of "foggy headed" and sat in a chair. Next thing pt noticed is that she was on the floor. Pt has a pacemaker/defib. Pt reported to EMS that symptoms have occurred in the past and it was her defib that fired.

## 2013-02-28 NOTE — ED Notes (Signed)
Per [employer], [place], received ventricular shock today then went back to sinus rhythm.

## 2013-02-28 NOTE — ED Notes (Signed)
Spoke with Man with [employer]. He will come see the pt at ED, 20 minutes out.

## 2013-02-28 NOTE — Progress Notes (Addendum)
[facility] here, pt being transferred to [facility] CCU 7214. Report called and given to Delora Fuel, RN at [facility]. Dr. Mayford Knife aware of transfer. Pt stable. See flowsheet for vital signs.

## 2013-02-28 NOTE — H&P (Signed)
History and Physical   Patient ID: [name] MRN: [id number], DOB/AGE: [birth year]   Admit date: 02/28/2013 Date of Consult: 02/28/2013   Primary Physician: Evelene Croon, MD Primary Cardiologist: Reece Agar. Ladona Ridgel, MD  HPI: [name] is a 42 y.o. female with PMHx s/f hypertrophic cardiomyopathy (s/p septal myomectomy and [employer] ICD 01/27/12), s/p (MVR at [facility]), recurrent VF with recurrent ICD discharges, chronic systolic CHF (EF 78-29%), PAF, profound nausea on amiodarone, h/o DVT, asthma and hypothyroidism who presents to [facility] ED today with VF s/p ICD shock.   Last echocardiogram was performed in April of 2014. She had septal hypokinesis status post myectomy. Ejection fraction was 40-45%. The patient was status post mitral valve repair with trivial mitral regurgitation. There was moderate to severe left atrial enlargement and mild right atrial enlargement.  She was recently admitted in a similar setting (VF s/p ICD shock when walking her dog) in 12/2012. She was diuresed and continued on Multaq and Toprol-XL. She was seen in follow-up 02/11/13. Diuretics were increased for worsening HF symptoms (class III). Additional options for VT were noted to be limited. Catheter ablation was considered.   She reports being functionally limited since then mostly due to lightheadedness on exertion and presyncope. She has been taking all medications as prescribed. She was sitting in the kitchen today when she became lightheaded, and lost consciousness for several seconds, and awoke on the floor. She denies precipatory chest pain. No worsening DOE/SOB, LE edema, PND or orthopnea. This was similar to her episode in April. She presented to the ED.   There, EKG revealed intermittent A-pacing and underlying LBBB. Device interrogation confirmed VF s/p shock with return to NSR. Intermittent episodes of NSVT, otherwise no prior shocks since April. K mildly decreased at 3.4. CXR without acute process.    Problem List: Past Medical History  Diagnosis Date  . Hypertrophic cardiomyopathy     s/p septal myomectomy with implantable defibrillator 01/27/2012   . Anxiety state, unspecified   . Allergic rhinitis     to dogs  . Asthma   . History of anemia     during pregnancy  . History of chicken pox   . Generalized headaches   . Ocular migraine     no HA  . Back pain     told cracked bone, vicodin didn't help, improved with percocets/muscle relaxants, no eval by prior PCP  . 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 DVT (deep venous thrombosis)     Diagnosed 02/13/12  - was placed on Warfarin after cardiac surgery but had skin reaction to warfarin so was changed to Pradaxa so DVT occurred while on Pradaxa although unclear if it occurred during transition  . Hypotension   . Atrial fibrillation     a. On pradaxa.  coumadin necrosis with warfarin;  b. Amio d/c'd 02/2012  . ICD (implantable cardiac defibrillator) in place     02/04/2012, [employer]  . Pericardial effusion   . Chronic combined systolic and diastolic CHF (congestive heart failure)     a. echo 03/19/12: severe asymmetric septal hypertrophy, evidence of upper septal myomectomy, peak LV outflow tract gradient 35, EF 35%,;  b.  Echo 9/13: Severe LVH, EF 30-35%, anteroseptal and inferior HK, LVOT narrow, mild AI, mild to moderate mitral stenosis, severe LAE.  Marland Kitchen Pericardial tamponade 03/14/2012  . 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 01/2012 s/p BSX ICD;  b.  06/2012 Multaq initiated due to recurrent syncope, VT/VF - had severe n/v with IV amio.  . Skin necrosis --COUMADIN   . Chronic chest wall pain     eval by CVTS, stable, rec return to [facility] if continued pain  . Syncope     a. in setting of VT/VF.  Marland Kitchen Hypothyroid     Past Surgical History  Procedure Laterality Date  . Cesarean section  1991  . Induced abortion  1990 and 1993  . Cardiac surgery  01/27/2012    median  sternotomy, septal myectomy, MVR - [facility] (Dr. Silvestre Mesi)  . Cardiac defibrillator placement  01/2012  . Myomectomy    . Pericardial window  03/14/2012    Procedure: PERICARDIAL WINDOW;  Surgeon: Purcell Nails, MD;  Location: [facility] OR;  Service: Open Heart Surgery;  Laterality: N/A;  Subxyphoid Pericardial  Window      Allergies:  Allergies  Allergen Reactions  . Phenergan (Promethazine Hcl) Itching  . Amiodarone Nausea And Vomiting    N/V on IV amio 06/2012  . Warfarin And Related Other (See Comments)    Scars on skin    Home Medications: Prior to Admission medications   Medication Sig Start Date End Date Taking? Authorizing Provider  ALPRAZolam Prudy Feeler) 1 MG tablet Take 1 mg by mouth at bedtime as needed for sleep.   Yes Historical Provider, MD  beclomethasone (QVAR) 80 MCG/ACT inhaler Inhale 1 puff into the lungs 2 (two) times daily.   Yes Historical Provider, MD  cetirizine (ZYRTEC) 10 MG tablet Take 10 mg by mouth daily.     Yes Historical Provider, MD  dabigatran (PRADAXA) 150 MG CAPS Take 1 capsule (150 mg total) by mouth every 12 (twelve) hours. 06/17/12  Yes Tonny Bollman, MD  dronedarone (MULTAQ) 400 MG tablet Take 1 tablet (400 mg total) by mouth 2 (two) times daily with a meal. 07/27/12  Yes Ok Anis, NP  furosemide (LASIX) 20 MG tablet Take 2 tablets (40 mg) once daily x 2 days then resume 1 tablet (20 mg) once daily. 01/21/13  Yes Brooke O Edmisten, PA-C  levothyroxine (SYNTHROID) 50 MCG tablet Take 50 mcg by mouth every evening.  09/11/12  Yes Tonny Bollman, MD  metoprolol succinate (TOPROL XL) 25 MG 24 hr tablet Take 1 tablet (25 mg total) by mouth daily. 06/16/12  Yes Rhonda G Barrett, PA-C  oxyCODONE-acetaminophen (PERCOCET/ROXICET) 5-325 MG per tablet Take 1 tablet by mouth every 8 (eight) hours as needed for pain. 11/11/12  Yes Jamesetta Orleans Lawyer, PA-C  Potassium Gluconate 550 MG TABS Take 1 tablet by mouth daily.    Yes Historical Provider, MD    Inpatient  Medications:  . metoprolol succinate  25 mg Oral Daily  . potassium chloride  40 mEq Oral Once    (Not in a hospital admission)  Family History  Problem Relation Age of Onset  . Diabetes Father   . Heart disease Father     unspecified heart problem  . Thyroid disease Mother   . Anemia Mother     mediterranean anemia, ITP  . Heart disease Maternal Grandfather     CHF  . Diabetes Paternal Grandfather   . Cancer Paternal Grandfather   . Coronary artery disease Neg Hx   . Stroke Neg Hx   . Sudden death Neg Hx      History   Social History  . Marital Status: Single    Spouse Name: N/A    Number of Children: 1  .  Years of Education: N/A   Occupational History  . Naval architect    Social History Main Topics  . Smoking status: Former Smoker -- 1.00 packs/day for 3 years    Types: Cigarettes    Quit date: 09/30/1988  . Smokeless tobacco: Never Used     Comment: quitin 1990  . Alcohol Use: No     Comment: none since "months" as of 02/28/2013  . Drug Use: No     Comment: quiti n 1989  . Sexually Active: Yes    Birth Control/ Protection: Pill   Other Topics Concern  . Not on file   Social History Narrative   Caffeine: 1 cup coffee/day   Divorced, 1 child.  lives with boyfriend, dogs   Occupation: Full time Tax adviser   Activity: walks 67min/day   Diet: fruits/vegetables daily, water, no fish     Review of Systems: General: negative for chills, fever, night sweats or weight changes.  Cardiovascular: positive for shortness of breath, negative for chest pain, dyspnea on exertion, edema, orthopnea, palpitations, paroxysmal nocturnal dyspnea or shortness of breath Dermatological: negative for rash Respiratory: negative for cough or wheezing Urologic: negative for hematuria Abdominal: negative for nausea, vomiting, diarrhea, bright red blood per rectum, melena, or hematemesis Neurologic:  negative for visual changes, syncope, or dizziness All other  systems reviewed and are otherwise negative except as noted above.  Physical Exam: Blood pressure 89/54, pulse 70, temperature 99.2 F (37.3 C), temperature source Oral, resp. rate 16, last menstrual period 02/17/2013, SpO2 99.00%.   General: Well developed, well nourished, in no acute distress. Head: Normocephalic, atraumatic, sclera non-icteric, no xanthomas, nares are without discharge.  Neck: Negative for carotid bruits. JVP 8 cm.  Lungs: Clear bilaterally to auscultation without wheezes, rales, or rhonchi. Breathing is unlabored. Heart: RRR with S1 S2, II/VI systolic murmur at RUSB/LUSB, no rubs, or gallops appreciated. Abdomen: Soft, non-tender, non-distended with normoactive bowel sounds. No hepatomegaly. No rebound/guarding. No obvious abdominal masses. Msk:  Strength and tone appears normal for age. Extremities: No clubbing, cyanosis or edema.  Distal pedal pulses are 2+ and equal bilaterally. Neuro: Alert and oriented X 3. Moves all extremities spontaneously. Psych:  Responds to questions appropriately with a normal affect.  Labs: Recent Labs     02/28/13  1432  02/28/13  1450  WBC  6.9   --   HGB  14.1  14.6  HCT  40.9  43.0  MCV  82.1   --   PLT  257   --    Recent Labs Lab 02/28/13 1450  NA 139  K 3.4*  CL 105  BUN 13  CREATININE 0.80  GLUCOSE 149*   Radiology/Studies: Dg Chest Port 1 View  02/28/2013   *RADIOLOGY REPORT*  Clinical Data: Syncope  PORTABLE CHEST - 1 VIEW  Comparison: 01/19/2013  Findings: Left subclavian AICD stable.  Previous median sternotomy. Mild cardiomegaly.  Lungs clear.  No effusion.  IMPRESSION:  Stable cardiomegaly and postop changes.  No acute disease.   Original Report Authenticated By: D. Andria Rhein, MD   EKG: NSR with intermittent A-pacing, underlying LBBB, 78 bpm  ASSESSMENT AND PLAN:   42 y.o. female with PMHx s/f hypertrophic cardiomyopathy (s/p septal myomectomy and [employer] ICD 01/27/12), s/p (MVR at [facility]), recurrent  VF with recurrent ICD discharges, chronic systolic CHF (EF 44-01%), PAF, profound nausea on amiodarone, h/o DVT, asthma and hypothyroidism who presents to [facility] ED today with VF s/p ICD shock.  1. Recurrent VF/VT s/p [employer] ICD 2. Hypertrophic cardiomyopathy s/p septal myomectomy and MVR 3. Chronic systolic CHF 4. PAF: currently a-paced 5. Amiodarone intolerance, profound nausea 6. H/o DVT 7. Hypothyroidism 8. Hypokalemia 9. S/p MVR 10. H/o DVT  The patient has presented for the second time in 2 months with confirmed terminated VF by ICD. This is despite Multaq and Toprol-XL use. She is intolerant to amiodarone due to profound nausea. Have discussed with Dr. Ladona Ridgel. Will plan to admit tonight, and transfer to [facility] for consideration of VT/VF ablation given history of septal ablation. Will replete K, and Mg prophylactically. Continue on home medications. Will admit to CCU and coordinate transfer to [facility].     Signed, R. Hurman Horn, PA-C 02/28/2013, 4:51 PM   Patient seen with PA, agree with the above note.  Complicated patient presents with multiple episodes of VT followed by episode of ventricular fibrillation with syncope and ICD shock.  SBP in 80s--90s which is baseline for her.  She does not appear markedly volume overloaded on exam.  She is on Pradaxa with prior history of DVT and history of PAF.  I talked to Dr. Ladona Ridgel (EP) today.  She will need a VT ablation at this point, which will be complicated given her history of HCM with septal myectomy.  She has been seen at [facility] in the past (had septal myectomy with MV repair there in 4/13).  He suggests transfer to [facility] for evaluation by EP there for VT ablation.  I have talked to the cardiology fellow at [facility] and will arrange the transfer.  Will give her K and Mg now.    Marca Ancona 02/28/2013 4:57 PM

## 2013-03-01 NOTE — Progress Notes (Signed)
UR COMPLETED  

## 2013-03-02 ENCOUNTER — Encounter: Payer: Self-pay | Admitting: Internal Medicine

## 2013-03-02 DIAGNOSIS — Z952 Presence of prosthetic heart valve: Secondary | ICD-10-CM

## 2013-03-02 DIAGNOSIS — E039 Hypothyroidism, unspecified: Secondary | ICD-10-CM

## 2013-03-02 DIAGNOSIS — E876 Hypokalemia: Secondary | ICD-10-CM

## 2013-03-02 NOTE — Discharge Summary (Signed)
Discharge Summary   Patient ID: [name],  MRN: [id number], DOB/AGE: 1971-09-19 42 y.o.  Admit date: 02/28/2013 Discharge date: 02/28/2013  Primary Physician: Evelene Croon, MD Primary Cardiologist: G. Ladona Ridgel, MD  Discharge Diagnoses Principal Problem:   Ventricular fibrillation Active Problems:   ANXIETY   Asthma   Back pain   Hypertrophic cardiomyopathy/modest left ventricular dysfunction-EF 45-50%   Chronic combined systolic and diastolic CHF (congestive heart failure)   Automatic implantable cardioverter-defibrillator in situ   Syncope   VT (ventricular tachycardia)   History of DVT (deep vein thrombosis)   Hypokalemia   Hypothyroidism   History of mitral valve replacement   Allergies Allergies  Allergen Reactions  . Phenergan (Promethazine Hcl) Itching  . Amiodarone Nausea And Vomiting    N/V on IV amio 06/2012  . Warfarin And Related Other (See Comments)    Scars on skin    Diagnostic Studies/Procedures  PORTABLE CHEST X-RAY - 02/28/13  IMPRESSION:  Stable cardiomegaly and postop changes. No acute disease.  History of Present Illness  [name] is a 42 y.o. female who presented to [facility] ED on 02/28/13 with the above problem list.   She has a history of hypertrophic cardiomyopathy (s/p septal myomectomy and [employer] ICD 01/27/12), s/p (MVR at [facility]), recurrent VF with recurrent ICD discharges, chronic systolic CHF (EF 95-62%), PAF, profound nausea on amiodarone, h/o DVT, asthma and hypothyroidism   She had recently been admitted in a similar setting (VF s/p ICD shock when walking her dog) in 12/2012. She was diuresed and continued on Multaq and Toprol-XL. She was seen in follow-up 02/11/13. Diuretics were increased for worsening HF symptoms (class III). Additional options for VT were noted to be limited. Catheter ablation was considered.   Since, she endorsed being functionally limited since then mostly due to lightheadedness on exertion and  presyncope. She had been taking all medications as prescribed. She was sitting in the kitchen the day of presentatoin when she became lightheaded, lost consciousness for several seconds, and awoke on the floor. She denied precipatory chest pain. No worsening DOE/SOB, LE edema, PND or orthopnea. This was similar to her episode in April. She presented to the ED.   There, EKG revealed intermittent A-pacing and underlying LBBB. Device interrogation confirmed VF s/p shock with return to NSR. Intermittent episodes of NSVT, otherwise no prior shocks since April. K mildly decreased at 3.4. CXR without acute process as above.   Hospital Course   The patient had presented for the second time in 2 months with confirmed terminated VF by ICD despite Multaq and Toprol-XL use. Further pharmacologic options were limited by her profound intolerance to amiodarone. The clinical situation was discussed with Dr. Ladona Ridgel who recommended pursuing RFCA; however this would prove to be complicated given her prior septal myomectomy, therefore, [facility] was recommended. She was hospitalized at [facility] while this was coordinated. She was noted to be mildly hypokalemic- potassium was repleted. She was also given 2 grams of IV Mg prophylactically. She was continued on home medications, and transferred to [facility] on 02/28/13.   Discharge Vitals:  Blood pressure 112/56, pulse 70, temperature 98.7 F (37.1 C), temperature source Oral, resp. rate 22, height 5' (1.524 m), weight 91.5 kg (201 lb 11.5 oz), last menstrual period 02/17/2013, SpO2 98.00%.   Labs: Recent Labs     02/28/13  1432  02/28/13  1450  WBC  6.9   --   HGB  14.1  14.6  HCT  40.9  43.0  MCV  82.1   --   PLT  257   --     Recent Labs Lab 02/28/13 1450  NA 139  K 3.4*  CL 105  BUN 13  CREATININE 0.80  GLUCOSE 149*   Recent Labs     02/28/13  1813  TROPONINI  <0.30   Disposition:       Future Appointments Provider Department Dept Phone    05/04/2013 3:45 PM Marinus Maw, MD [facility] Main [facility] [place]) [phone]     Discharge Medications:    Medication List    TAKE these medications       ALPRAZolam 1 MG tablet  Commonly known as:  XANAX  Take 1 mg by mouth at bedtime as needed for sleep.     beclomethasone 80 MCG/ACT inhaler  Commonly known as:  QVAR  Inhale 1 puff into the lungs 2 (two) times daily.     cetirizine 10 MG tablet  Commonly known as:  ZYRTEC  Take 10 mg by mouth daily.     dabigatran 150 MG Caps  Commonly known as:  PRADAXA  Take 1 capsule (150 mg total) by mouth every 12 (twelve) hours.     dronedarone 400 MG tablet  Commonly known as:  MULTAQ  Take 1 tablet (400 mg total) by mouth 2 (two) times daily with a meal.     furosemide 20 MG tablet  Commonly known as:  LASIX  Take 2 tablets (40 mg) once daily x 2 days then resume 1 tablet (20 mg) once daily.     metoprolol succinate 25 MG 24 hr tablet  Commonly known as:  TOPROL XL  Take 1 tablet (25 mg total) by mouth daily.     oxyCODONE-acetaminophen 5-325 MG per tablet  Commonly known as:  PERCOCET/ROXICET  Take 1 tablet by mouth every 8 (eight) hours as needed for pain.     Potassium Gluconate 550 MG Tabs  Take 1 tablet by mouth daily.     SYNTHROID 50 MCG tablet  Generic drug:  levothyroxine  Take 50 mcg by mouth every evening.       Outstanding Labs/Studies: None  Duration of Discharge Encounter: Greater than 30 minutes including physician time.  Signed, R. Hurman Horn, PA-C 03/02/2013, 3:39 PM

## 2013-04-16 ENCOUNTER — Encounter (HOSPITAL_COMMUNITY): Payer: Self-pay | Admitting: *Deleted

## 2013-04-16 ENCOUNTER — Emergency Department (HOSPITAL_COMMUNITY)
Admission: EM | Admit: 2013-04-16 | Discharge: 2013-04-16 | Disposition: A | Discharge status: Home / Self Care | Payer: BC Managed Care – PPO | Attending: Emergency Medicine | Admitting: Emergency Medicine

## 2013-04-16 ENCOUNTER — Emergency Department (HOSPITAL_COMMUNITY): Payer: BC Managed Care – PPO

## 2013-04-16 DIAGNOSIS — E039 Hypothyroidism, unspecified: Secondary | ICD-10-CM | POA: Insufficient documentation

## 2013-04-16 DIAGNOSIS — Z862 Personal history of diseases of the blood and blood-forming organs and certain disorders involving the immune mechanism: Secondary | ICD-10-CM | POA: Insufficient documentation

## 2013-04-16 DIAGNOSIS — Z86718 Personal history of other venous thrombosis and embolism: Secondary | ICD-10-CM | POA: Insufficient documentation

## 2013-04-16 DIAGNOSIS — R079 Chest pain, unspecified: Secondary | ICD-10-CM

## 2013-04-16 DIAGNOSIS — Z8679 Personal history of other diseases of the circulatory system: Secondary | ICD-10-CM | POA: Insufficient documentation

## 2013-04-16 DIAGNOSIS — Z8619 Personal history of other infectious and parasitic diseases: Secondary | ICD-10-CM | POA: Insufficient documentation

## 2013-04-16 DIAGNOSIS — J45901 Unspecified asthma with (acute) exacerbation: Secondary | ICD-10-CM | POA: Insufficient documentation

## 2013-04-16 DIAGNOSIS — I4891 Unspecified atrial fibrillation: Secondary | ICD-10-CM | POA: Insufficient documentation

## 2013-04-16 DIAGNOSIS — F411 Generalized anxiety disorder: Secondary | ICD-10-CM | POA: Insufficient documentation

## 2013-04-16 DIAGNOSIS — R0789 Other chest pain: Secondary | ICD-10-CM | POA: Insufficient documentation

## 2013-04-16 DIAGNOSIS — Z79899 Other long term (current) drug therapy: Secondary | ICD-10-CM | POA: Insufficient documentation

## 2013-04-16 DIAGNOSIS — Z872 Personal history of diseases of the skin and subcutaneous tissue: Secondary | ICD-10-CM | POA: Insufficient documentation

## 2013-04-16 LAB — PRO B NATRIURETIC PEPTIDE: Pro B Natriuretic peptide (BNP): 402.9 pg/mL — ABNORMAL HIGH (ref 0–125)

## 2013-04-16 LAB — CBC WITH DIFFERENTIAL/PLATELET
Basophils Relative: 0 % (ref 0–1)
Eosinophils Absolute: 0.2 10*3/uL (ref 0.0–0.7)
Hemoglobin: 11.9 g/dL — ABNORMAL LOW (ref 12.0–15.0)
Lymphs Abs: 1.1 10*3/uL (ref 0.7–4.0)
Monocytes Relative: 19 % — ABNORMAL HIGH (ref 3–12)
Neutro Abs: 2.1 10*3/uL (ref 1.7–7.7)
Neutrophils Relative %: 50 % (ref 43–77)
Platelets: 236 10*3/uL (ref 150–400)
RBC: 4.22 MIL/uL (ref 3.87–5.11)

## 2013-04-16 LAB — BASIC METABOLIC PANEL
BUN: 13 mg/dL (ref 6–23)
Chloride: 103 mEq/L (ref 96–112)
GFR calc Af Amer: >90 mL/min (ref >90)
GFR calc non Af Amer: >90 mL/min (ref >90)
Glucose, Bld: 89 mg/dL (ref 70–99)
Potassium: 3.6 mEq/L (ref 3.5–5.1)
Sodium: 136 mEq/L (ref 135–145)

## 2013-04-16 LAB — POCT I-STAT TROPONIN I
Troponin i, poc: 0.03 ng/mL (ref 0.00–0.08)
Troponin i, poc: 0.04 ng/mL (ref 0.00–0.08)

## 2013-04-16 LAB — OCCULT BLOOD, POC DEVICE: Fecal Occult Bld: NEGATIVE

## 2013-04-16 MED ORDER — SODIUM CHLORIDE 0.9 % IV BOLUS (SEPSIS)
1000.0000 mL | Freq: Once | INTRAVENOUS | Status: AC
  Administered 2013-04-16: 1000 mL via INTRAVENOUS

## 2013-04-16 MED ORDER — NITROGLYCERIN 0.4 MG SL SUBL: 0.4000 mg | SUBLINGUAL_TABLET | SUBLINGUAL | Status: DC | PRN

## 2013-04-16 NOTE — ED Provider Notes (Signed)
History    CSN: [id number] Arrival date & time 04/16/13  1725  First MD Initiated Contact with Patient 04/16/13 1735     Chief Complaint  Patient presents with  . Chest Pain   (Consider location/radiation/quality/duration/timing/severity/associated sxs/prior Treatment) HPI Comments: Pt w/ hx of HOCM w/ AICD, followed at [facility] - on transplant list, s/p ablation, pericardial window 2/2 tamponade, myomectomy, now w/ chest pain. States sudden onset occuring while at rest - initially left sided chest pressure, radiated to right side. A/w palpitations and dyspnea. Denies nausea or diaphoresis. Pain constant since onset, improving. Sx onset 2 hrs pta. Took asa 324mg  and SL nitro x 1 w/out relief of sx. Pt now states sx feel like a "bubble in chest". No pleuritic component. No recent fever or cough or trauma.   Patient is a 42 y.o. female presenting with general illness. The history is provided by the patient. No language interpreter was used.  Illness Location:  Cardio/pulm Quality:  Chest pain, dyspnea Severity:  Mild Onset quality:  Sudden Duration:  2 hours Timing:  Constant Progression:  Improving Chronicity:  New Associated symptoms: chest pain and shortness of breath   Associated symptoms: no abdominal pain, no congestion, no cough, no diarrhea, no fever, no headaches, no nausea, no rash, no sore throat and no vomiting    Past Medical History  Diagnosis Date  . Hypertrophic cardiomyopathy     s/p septal myomectomy with implantable defibrillator 01/27/2012   . Anxiety state, unspecified   . Allergic rhinitis     to dogs  . Asthma   . History of anemia     during pregnancy  . History of chicken pox   . Generalized headaches   . Ocular migraine     no HA  . Back pain     told cracked bone, vicodin didn't help, improved with percocets/muscle relaxants, no eval by prior PCP  . 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 DVT (deep  venous thrombosis)     Diagnosed 02/13/12  - was placed on Warfarin after cardiac surgery but had skin reaction to warfarin so was changed to Pradaxa so DVT occurred while on Pradaxa although unclear if it occurred during transition  . Hypotension   . Atrial fibrillation     a. On pradaxa.  coumadin necrosis with warfarin;  b. Amio d/c'd 02/2012  . ICD (implantable cardiac defibrillator) in place     02/04/2012, [employer]  . Pericardial effusion   . Chronic combined systolic and diastolic CHF (congestive heart failure)     a. echo 03/19/12: severe asymmetric septal hypertrophy, evidence of upper septal myomectomy, peak LV outflow tract gradient 35, EF 35%,;  b.  Echo 9/13: Severe LVH, EF 30-35%, anteroseptal and inferior HK, LVOT narrow, mild AI, mild to moderate mitral stenosis, severe LAE.  Marland Kitchen Pericardial tamponade 03/14/2012  . 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 01/2012 s/p BSX ICD;  b. 06/2012 Multaq initiated due to recurrent syncope, VT/VF - had severe n/v with IV amio.  . Skin necrosis --COUMADIN   . Chronic chest wall pain     eval by CVTS, stable, rec return to [facility] if continued pain  . Syncope     a. in setting of VT/VF.  Marland Kitchen Hypothyroid    Past Surgical History  Procedure Laterality Date  . Cesarean section  1991  . Induced abortion  1990 and 1993  .  Cardiac surgery  01/27/2012    median sternotomy, septal myectomy, MVR - [facility] (Dr. Silvestre Mesi)  . Cardiac defibrillator placement  01/2012  . Myomectomy    . Pericardial window  03/14/2012    Procedure: PERICARDIAL WINDOW;  Surgeon: Purcell Nails, MD;  Location: [facility] OR;  Service: Open Heart Surgery;  Laterality: N/A;  Subxyphoid Pericardial  Window    Family History  Problem Relation Age of Onset  . Diabetes Father   . Heart disease Father     unspecified heart problem  . Thyroid disease Mother   . Anemia Mother     mediterranean anemia, ITP  . Heart disease Maternal Grandfather     CHF  . Diabetes Paternal  Grandfather   . Cancer Paternal Grandfather   . Coronary artery disease Neg Hx   . Stroke Neg Hx   . Sudden death Neg Hx    History  Substance Use Topics  . Smoking status: Former Smoker -- 1.00 packs/day for 3 years    Types: Cigarettes    Quit date: 09/30/1988  . Smokeless tobacco: Never Used     Comment: quitin 1990  . Alcohol Use: No     Comment: none since "months" as of 02/28/2013   OB History   Grav Para Term Preterm Abortions TAB SAB Ect Mult Living                 Review of Systems  Constitutional: Negative for fever and chills.  HENT: Negative for congestion and sore throat.   Respiratory: Positive for shortness of breath. Negative for cough.   Cardiovascular: Positive for chest pain and palpitations. Negative for leg swelling.  Gastrointestinal: Negative for nausea, vomiting, abdominal pain, diarrhea and constipation.  Genitourinary: Negative for dysuria and frequency.  Skin: Negative for color change and rash.  Neurological: Negative for dizziness and headaches.  Psychiatric/Behavioral: Negative for confusion and agitation.  All other systems reviewed and are negative.    Allergies  Phenergan; Amiodarone; and Warfarin and related  Home Medications   Current Outpatient Rx  Name  Route  Sig  Dispense  Refill  . acetaminophen (TYLENOL) 500 MG tablet   Oral   Take 500 mg by mouth 3 (three) times daily as needed for pain.         Marland Kitchen ALPRAZolam (XANAX) 1 MG tablet   Oral   Take 1 mg by mouth 3 (three) times daily as needed for anxiety.          Marland Kitchen aspirin EC 81 MG tablet   Oral   Take 81 mg by mouth daily.         . beclomethasone (QVAR) 80 MCG/ACT inhaler   Inhalation   Inhale 1 puff into the lungs 2 (two) times daily.         . cetirizine (ZYRTEC) 10 MG tablet   Oral   Take 10 mg by mouth every evening.          . docusate sodium (STOOL SOFTENER) 100 MG capsule   Oral   Take 100 mg by mouth daily as needed for constipation.          .  furosemide (LASIX) 20 MG tablet   Oral   Take 20 mg by mouth every other day.          . levothyroxine (SYNTHROID, LEVOTHROID) 50 MCG tablet   Oral   Take 50 mcg by mouth daily before breakfast.          .  Magnesium 500 MG CAPS   Oral   Take 500 mg by mouth every evening.          . metoprolol succinate (TOPROL-XL) 25 MG 24 hr tablet   Oral   Take 12.5 mg by mouth daily.          Marland Kitchen mexiletine (MEXITIL) 150 MG capsule   Oral   Take 300 mg by mouth 3 (three) times daily.          . ondansetron (ZOFRAN) 4 MG tablet   Oral   Take 4 mg by mouth every 12 (twelve) hours as needed for nausea.          . Potassium Gluconate 595 MG CAPS   Oral   Take 595 mg by mouth daily.         . ranolazine (RANEXA) 1000 MG SR tablet   Oral   Take 1,000 mg by mouth 2 (two) times daily.           BP 98/59  Pulse 74  Resp 16  SpO2 99%  LMP 04/16/2013 Physical Exam  Vitals reviewed. Constitutional: She is oriented to person, place, and time. She appears well-developed and well-nourished. No distress.  HENT:  Head: Normocephalic and atraumatic.  Eyes: EOM are normal. Pupils are equal, round, and reactive to light.  Neck: Normal range of motion. Neck supple. No JVD present.  Cardiovascular: Normal rate and regular rhythm.   Murmur heard.  Systolic murmur is present    Pulmonary/Chest: Effort normal and breath sounds normal. No respiratory distress.  Abdominal: Soft. She exhibits no distension.  Musculoskeletal: Normal range of motion. She exhibits no edema.       Right lower leg: She exhibits no edema.       Left lower leg: She exhibits no edema.  Neurological: She is alert and oriented to person, place, and time.  Skin: Skin is warm and dry.  Psychiatric: She has a normal mood and affect. Her behavior is normal.    ED Course  Procedures (including critical care time) Results for orders placed during the hospital encounter of 04/16/13  PRO B NATRIURETIC PEPTIDE       Result Value Range   Pro B Natriuretic peptide (BNP) 402.9 (*) 0 - 125 pg/mL  CBC WITH DIFFERENTIAL      Result Value Range   WBC 4.2  4.0 - 10.5 K/uL   RBC 4.22  3.87 - 5.11 MIL/uL   Hemoglobin 11.9 (*) 12.0 - 15.0 g/dL   HCT 57.8 (*) 46.9 - 62.9 %   MCV 83.9  78.0 - 100.0 fL   MCH 28.2  26.0 - 34.0 pg   MCHC 33.6  30.0 - 36.0 g/dL   RDW 52.8  41.3 - 24.4 %   Platelets 236  150 - 400 K/uL   Neutrophils Relative % 50  43 - 77 %   Neutro Abs 2.1  1.7 - 7.7 K/uL   Lymphocytes Relative 26  12 - 46 %   Lymphs Abs 1.1  0.7 - 4.0 K/uL   Monocytes Relative 19 (*) 3 - 12 %   Monocytes Absolute 0.8  0.1 - 1.0 K/uL   Eosinophils Relative 5  0 - 5 %   Eosinophils Absolute 0.2  0.0 - 0.7 K/uL   Basophils Relative 0  0 - 1 %   Basophils Absolute 0.0  0.0 - 0.1 K/uL  BASIC METABOLIC PANEL      Result Value Range   Sodium 136  135 - 145 mEq/L   Potassium 3.6  3.5 - 5.1 mEq/L   Chloride 103  96 - 112 mEq/L   CO2 23  19 - 32 mEq/L   Glucose, Bld 89  70 - 99 mg/dL   BUN 13  6 - 23 mg/dL   Creatinine, Ser 1.61  0.50 - 1.10 mg/dL   Calcium 9.0  8.4 - 09.6 mg/dL   GFR calc non Af Amer >90  >90 mL/min   GFR calc Af Amer >90  >90 mL/min  POCT I-STAT TROPONIN I      Result Value Range   Troponin i, poc 0.03  0.00 - 0.08 ng/mL   Comment 3           OCCULT BLOOD, POC DEVICE      Result Value Range   Fecal Occult Bld NEGATIVE  NEGATIVE  POCT I-STAT TROPONIN I      Result Value Range   Troponin i, poc 0.04  0.00 - 0.08 ng/mL   Comment 3             Dg Chest 2 View  04/16/2013   *RADIOLOGY REPORT*  Clinical Data: Chest pain and pressure.  CHEST - 2 VIEW  Comparison: Single view of the chest 02/28/2013.  Findings: ICD is in place.  The patient is status post median sternotomy.  There is cardiomegaly without edema.  Lungs are clear. Mild elevation of the left hemidiaphragm relative to the right is noted.  IMPRESSION: Cardiomegaly without acute disease.   Original Report Authenticated By: Holley Dexter, M.D.   No diagnosis found.  MDM  Exam as above, NAD, SBP in 90s - her baseline - given 1L IVF, no resp distress or hypoxia. No tachycardia. Lungs clear. Pacer interrogated - no arrhythmia, 3 hr troponin x 2 negative. CXR c/w pts known cardiomegaly, hgb 11.9 - hemoccult negative. BNP lower than baseline at 402, BMP unremarkable. Monitor w/ paced rhythm, no ectopy. D/w [facility] cardiac transplant team - Idamae Lusher - states pts baseline hgb was 11.0 - 2 wks ago - no acute anemia. States pt has apt on 7/24. No indication to transfer to [facility] at this time. Doubt ACS, PE, tamponade, dissection, boerhaave, PTX. No apparent life threatening arrhythmia - no defibrillator firing. Pt is asymptomatic, no chest pain, dyspnea, palpitations or syncope. Chest pain is atypical - recent cath w/out obstructive lesions per [facility]. Recommend close fup w/ [facility] this wk. Given strict return precautions. D/c home in good and stable condition.  1. Chest pain    Discharge Medication List as of 04/16/2013 11:13 PM     Evelene Croon, MD [facility] [place] [address] [phone]  Schedule an appointment as soon as possible for a visit      follow up with [facility] transplant on 7/24 as scheduled    Audelia Hives, MD 04/17/13 [phone]

## 2013-04-16 NOTE — ED Notes (Signed)
The pt has had chest pain for approx  Two hours while resting.  No n v no sob .  Lt sided pressure non-radiating .  She had of aspirin and one sl nitro   Still has chest pain.  She has an aicd for af and vf.  She is on the  Heart transplant list at [facility].  Iv per ems

## 2013-04-17 NOTE — ED Provider Notes (Signed)
I saw and evaluated the patient, reviewed the resident's note and I agree with the findings and plan.   .Face to face Exam:  General:  Awake HEENT:  Atraumatic Resp:  Normal effort Abd:  Nondistended Neuro:No focal weakness    Nelia Shi, MD 04/17/13 1517

## 2013-04-19 ENCOUNTER — Emergency Department (HOSPITAL_COMMUNITY): Payer: BC Managed Care – PPO

## 2013-04-19 ENCOUNTER — Telehealth: Payer: Self-pay | Admitting: Internal Medicine

## 2013-04-19 ENCOUNTER — Emergency Department (HOSPITAL_COMMUNITY)
Admission: EM | Admit: 2013-04-19 | Discharge: 2013-04-19 | Disposition: A | Discharge status: Home / Self Care | Payer: BC Managed Care – PPO | Attending: Emergency Medicine | Admitting: Emergency Medicine

## 2013-04-19 ENCOUNTER — Encounter (HOSPITAL_COMMUNITY): Payer: Self-pay | Admitting: *Deleted

## 2013-04-19 DIAGNOSIS — R209 Unspecified disturbances of skin sensation: Secondary | ICD-10-CM | POA: Insufficient documentation

## 2013-04-19 DIAGNOSIS — F411 Generalized anxiety disorder: Secondary | ICD-10-CM | POA: Insufficient documentation

## 2013-04-19 DIAGNOSIS — R200 Anesthesia of skin: Secondary | ICD-10-CM

## 2013-04-19 DIAGNOSIS — Z7982 Long term (current) use of aspirin: Secondary | ICD-10-CM | POA: Insufficient documentation

## 2013-04-19 DIAGNOSIS — G8929 Other chronic pain: Secondary | ICD-10-CM | POA: Insufficient documentation

## 2013-04-19 DIAGNOSIS — Z862 Personal history of diseases of the blood and blood-forming organs and certain disorders involving the immune mechanism: Secondary | ICD-10-CM | POA: Insufficient documentation

## 2013-04-19 DIAGNOSIS — Z8619 Personal history of other infectious and parasitic diseases: Secondary | ICD-10-CM | POA: Insufficient documentation

## 2013-04-19 DIAGNOSIS — I4891 Unspecified atrial fibrillation: Secondary | ICD-10-CM | POA: Insufficient documentation

## 2013-04-19 DIAGNOSIS — Z79899 Other long term (current) drug therapy: Secondary | ICD-10-CM | POA: Insufficient documentation

## 2013-04-19 DIAGNOSIS — Z8709 Personal history of other diseases of the respiratory system: Secondary | ICD-10-CM | POA: Insufficient documentation

## 2013-04-19 DIAGNOSIS — R42 Dizziness and giddiness: Secondary | ICD-10-CM | POA: Insufficient documentation

## 2013-04-19 DIAGNOSIS — I5042 Chronic combined systolic (congestive) and diastolic (congestive) heart failure: Secondary | ICD-10-CM | POA: Insufficient documentation

## 2013-04-19 DIAGNOSIS — Z86718 Personal history of other venous thrombosis and embolism: Secondary | ICD-10-CM | POA: Insufficient documentation

## 2013-04-19 DIAGNOSIS — Z87891 Personal history of nicotine dependence: Secondary | ICD-10-CM | POA: Insufficient documentation

## 2013-04-19 DIAGNOSIS — R51 Headache: Secondary | ICD-10-CM | POA: Insufficient documentation

## 2013-04-19 DIAGNOSIS — E039 Hypothyroidism, unspecified: Secondary | ICD-10-CM | POA: Insufficient documentation

## 2013-04-19 DIAGNOSIS — Z872 Personal history of diseases of the skin and subcutaneous tissue: Secondary | ICD-10-CM | POA: Insufficient documentation

## 2013-04-19 DIAGNOSIS — J45909 Unspecified asthma, uncomplicated: Secondary | ICD-10-CM | POA: Insufficient documentation

## 2013-04-19 DIAGNOSIS — Z8679 Personal history of other diseases of the circulatory system: Secondary | ICD-10-CM | POA: Insufficient documentation

## 2013-04-19 LAB — CBC WITH DIFFERENTIAL/PLATELET
Basophils Relative: 0 % (ref 0–1)
Eosinophils Absolute: 0.2 10*3/uL (ref 0.0–0.7)
Hemoglobin: 12.8 g/dL (ref 12.0–15.0)
MCH: 27.9 pg (ref 26.0–34.0)
MCHC: 33.2 g/dL (ref 30.0–36.0)
Neutro Abs: 3.5 10*3/uL (ref 1.7–7.7)
Neutrophils Relative %: 61 % (ref 43–77)
Platelets: 309 10*3/uL (ref 150–400)
RBC: 4.59 MIL/uL (ref 3.87–5.11)

## 2013-04-19 LAB — COMPREHENSIVE METABOLIC PANEL
ALT: 31 U/L (ref 0–35)
AST: 28 U/L (ref 0–37)
Albumin: 4.2 g/dL (ref 3.5–5.2)
Alkaline Phosphatase: 54 U/L (ref 39–117)
Chloride: 105 mEq/L (ref 96–112)
Potassium: 3.9 mEq/L (ref 3.5–5.1)
Sodium: 137 mEq/L (ref 135–145)
Total Bilirubin: 0.4 mg/dL (ref 0.3–1.2)
Total Protein: 7.2 g/dL (ref 6.0–8.3)

## 2013-04-19 LAB — POCT I-STAT TROPONIN I: Troponin i, poc: 0.03 ng/mL (ref 0.00–0.08)

## 2013-04-19 MED ORDER — ONDANSETRON HCL 4 MG/2ML IJ SOLN
4.0000 mg | Freq: Once | INTRAMUSCULAR | Status: DC
  Filled 2013-04-19: qty 2

## 2013-04-19 NOTE — Telephone Encounter (Signed)
New prob  Pt states she was in [facility] hospital for 42 days and she has some question regarding how she is feeling.

## 2013-04-19 NOTE — ED Notes (Signed)
Pt reports numbness occurred on 04-16-13

## 2013-04-19 NOTE — ED Provider Notes (Signed)
History    CSN: [id number] Arrival date & time 04/19/13  1735  First MD Initiated Contact with Patient 04/19/13 1757     Chief Complaint  Patient presents with  . Numbness   (Consider location/radiation/quality/duration/timing/severity/associated sxs/prior Treatment) HPI Comments: Patient presents with a chief complaint of numbness of her left face, left arm, and left leg.  She reports onset of symptoms was one hour prior to arrival in the ED and has been constant since that time.  She states that she had similar symptoms on 04/16/13, however, at that time her numbness was bilateral.  She also reports an associated headache, but reports that the headache is chronic and has been present for months.  She also reports that she does feel lightheaded, but reports that she has felt this way since she was started on a new cardiac medication one month ago.  She denies any visual changes.  Denies weakness, difficulty speaking, difficulty swallowing, or facial droop.  She denies prior history of TIA or CVA.  She does have a history HOCM w/ AICD, followed at [facility] - on transplant list, s/p ablation, pericardial window 2/2 tamponade.  She reports that she has an appointment with her Cardiologist at [facility] in three days.  The history is provided by the patient.   Past Medical History  Diagnosis Date  . Hypertrophic cardiomyopathy     s/p septal myomectomy with implantable defibrillator 01/27/2012   . Anxiety state, unspecified   . Allergic rhinitis     to dogs  . Asthma   . History of anemia     during pregnancy  . History of chicken pox   . Generalized headaches   . Ocular migraine     no HA  . Back pain     told cracked bone, vicodin didn't help, improved with percocets/muscle relaxants, no eval by prior PCP  . 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 DVT (deep venous thrombosis)     Diagnosed 02/13/12  - was placed on Warfarin after cardiac  surgery but had skin reaction to warfarin so was changed to Pradaxa so DVT occurred while on Pradaxa although unclear if it occurred during transition  . Hypotension   . Atrial fibrillation     a. On pradaxa.  coumadin necrosis with warfarin;  b. Amio d/c'd 02/2012  . ICD (implantable cardiac defibrillator) in place     02/04/2012, [employer]  . Pericardial effusion   . Chronic combined systolic and diastolic CHF (congestive heart failure)     a. echo 03/19/12: severe asymmetric septal hypertrophy, evidence of upper septal myomectomy, peak LV outflow tract gradient 35, EF 35%,;  b.  Echo 9/13: Severe LVH, EF 30-35%, anteroseptal and inferior HK, LVOT narrow, mild AI, mild to moderate mitral stenosis, severe LAE.  Marland Kitchen Pericardial tamponade 03/14/2012  . 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 01/2012 s/p BSX ICD;  b. 06/2012 Multaq initiated due to recurrent syncope, VT/VF - had severe n/v with IV amio.  . Skin necrosis --COUMADIN   . Chronic chest wall pain     eval by CVTS, stable, rec return to [facility] if continued pain  . Syncope     a. in setting of VT/VF.  Marland Kitchen Hypothyroid    Past Surgical History  Procedure Laterality Date  . Cesarean section  1991  . Induced abortion  1990 and 1993  . Cardiac surgery  01/27/2012  median sternotomy, septal myectomy, MVR - [facility] (Dr. Silvestre Mesi)  . Cardiac defibrillator placement  01/2012  . Myomectomy    . Pericardial window  03/14/2012    Procedure: PERICARDIAL WINDOW;  Surgeon: Purcell Nails, MD;  Location: [facility] OR;  Service: Open Heart Surgery;  Laterality: N/A;  Subxyphoid Pericardial  Window    Family History  Problem Relation Age of Onset  . Diabetes Father   . Heart disease Father     unspecified heart problem  . Thyroid disease Mother   . Anemia Mother     mediterranean anemia, ITP  . Heart disease Maternal Grandfather     CHF  . Diabetes Paternal Grandfather   . Cancer Paternal Grandfather   . Coronary artery disease Neg Hx   . Stroke  Neg Hx   . Sudden death Neg Hx    History  Substance Use Topics  . Smoking status: Former Smoker -- 1.00 packs/day for 3 years    Types: Cigarettes    Quit date: 09/30/1988  . Smokeless tobacco: Never Used     Comment: quitin 1990  . Alcohol Use: No     Comment: none since "months" as of 02/28/2013   OB History   Grav Para Term Preterm Abortions TAB SAB Ect Mult Living                 Review of Systems  Neurological: Positive for dizziness, numbness and headaches. Negative for syncope, facial asymmetry, speech difficulty and weakness.  Psychiatric/Behavioral: Negative for confusion.  All other systems reviewed and are negative.    Allergies  Phenergan; Amiodarone; and Warfarin and related  Home Medications   Current Outpatient Rx  Name  Route  Sig  Dispense  Refill  . acetaminophen (TYLENOL) 500 MG tablet   Oral   Take 500 mg by mouth 3 (three) times daily as needed for pain.         Marland Kitchen ALPRAZolam (XANAX) 1 MG tablet   Oral   Take 1 mg by mouth 3 (three) times daily as needed for anxiety.          Marland Kitchen aspirin EC 81 MG tablet   Oral   Take 81 mg by mouth daily.         . beclomethasone (QVAR) 80 MCG/ACT inhaler   Inhalation   Inhale 1 puff into the lungs 2 (two) times daily.         . cetirizine (ZYRTEC) 10 MG tablet   Oral   Take 10 mg by mouth every evening.          . docusate sodium (STOOL SOFTENER) 100 MG capsule   Oral   Take 100 mg by mouth daily as needed for constipation.          . furosemide (LASIX) 20 MG tablet   Oral   Take 20 mg by mouth every other day.          . levothyroxine (SYNTHROID, LEVOTHROID) 50 MCG tablet   Oral   Take 50 mcg by mouth daily before breakfast.          . Magnesium 500 MG CAPS   Oral   Take 500 mg by mouth every evening.          . metoprolol succinate (TOPROL-XL) 25 MG 24 hr tablet   Oral   Take 12.5 mg by mouth daily.          Marland Kitchen mexiletine (MEXITIL) 150 MG capsule   Oral  Take 300 mg by  mouth 3 (three) times daily.          . ondansetron (ZOFRAN) 4 MG tablet   Oral   Take 4 mg by mouth every 12 (twelve) hours as needed for nausea.          . Potassium Gluconate 595 MG CAPS   Oral   Take 595 mg by mouth daily.         . ranolazine (RANEXA) 1000 MG SR tablet   Oral   Take 1,000 mg by mouth 2 (two) times daily.          . Simethicone (GAS-X PO)   Oral   Take 1 tablet by mouth 2 (two) times daily as needed (pressure).          BP 112/53  Pulse 76  Temp(Src) 98.4 F (36.9 C) (Oral)  Resp 18  SpO2 100%  LMP 04/16/2013 Physical Exam  Nursing note and vitals reviewed. Constitutional: She appears well-developed and well-nourished. No distress.  HENT:  Head: Normocephalic and atraumatic.  Mouth/Throat: Oropharynx is clear and moist.  Eyes: EOM are normal. Pupils are equal, round, and reactive to light.  Neck: Normal range of motion. Neck supple.  Cardiovascular: Normal rate, regular rhythm and normal heart sounds.   Pulmonary/Chest: Effort normal. No respiratory distress. She has no wheezes. She has no rales.  Abdominal: Soft. Bowel sounds are normal. She exhibits no distension. There is no tenderness.  Neurological: She is alert. She has normal strength. A sensory deficit is present. No cranial nerve deficit. Coordination and gait normal.  Decreased sensation of left face, left arm, and left leg Normal finger to nose testing bilaterally Normal rapid alternating movements. Face symmetric, no facial droop    Skin: Skin is warm and dry. She is not diaphoretic.  Psychiatric: She has a normal mood and affect.    ED Course  Procedures (including critical care time) Labs Reviewed  CBC WITH DIFFERENTIAL  COMPREHENSIVE METABOLIC PANEL  PROTIME-INR   Dg Chest 2 View  04/19/2013   *RADIOLOGY REPORT*  Clinical Data: Chest pain.  Numbness on the left side of the body.  CHEST - 2 VIEW  Comparison: 04/16/2013.  Findings: Cardiomegaly is present.  Left  subclavian AICD appears similar to the prior exam.  Pulmonary vascular congestion and appears more prominent than on the most recent prior exam 04/16/2013.  No airspace disease.  Median sternotomy.  IMPRESSION: Cardiomegaly and pulmonary vascular congestion.  No change in support apparatus.   Original Report Authenticated By: Andreas Newport, M.D.   Ct Head Wo Contrast  04/19/2013   *RADIOLOGY REPORT*  Clinical Data: Headache and numbness  CT HEAD WITHOUT CONTRAST  Technique:  Contiguous axial images were obtained from the base of the skull through the vertex without contrast.  Comparison: Head CT 02/20 09/2012 and 07/22/2012  Findings: The ventricles are normal and stable in size.  Negative for hemorrhage, mass effect, mass lesion, or evidence of acute infarction.  The visualized paranasal sinuses and mastoid air cells are clear.  The calvarium is thickened, chronic.  The skull is intact. Soft tissues of the scalp are symmetric.  IMPRESSION: No acute intracranial abnormality.   Original Report Authenticated By: Britta Mccreedy, M.D.   No diagnosis found.  Patient discussed and also evaluated by Dr. Denton Lank   Date: 04/19/2013  Rate: 76  Rhythm: paced rhythm  QRS Axis: left  Intervals: normal  ST/T Wave abnormalities: nonspecific T wave changes  Conduction Disutrbances:none, LBBB  Narrative Interpretation:   Old EKG Reviewed: unchanged   Patient discussed with Dr. Denton Lank who also evaluated the patient.   8:00 PM Patient signed out to [employer], PA-C at shift change.  Labs pending.  Plan is for the patient to be discharged home if labs are unremarkable.  Plan has been discussed with Dr. Denton Lank who is also in agreement with the plan.   MDM  Patient presenting with numbness of the left upper extremity, left lower extremity, and the left face.  During ED course patient then started complaining of numbness also on the right.  Normal neurological exam aside from decreased sensation of the left.   Normal muscle strength.  CN II-XII intact.  Head CT negative.  Labs pending.  Plan is for the patient to be discharged if labs WNL.     Pascal Lux [place], PA-C 04/22/13 [phone]

## 2013-04-19 NOTE — Telephone Encounter (Signed)
Left message for patient to return my call.

## 2013-04-20 ENCOUNTER — Telehealth (HOSPITAL_COMMUNITY): Payer: Self-pay | Admitting: Emergency Medicine

## 2013-04-20 NOTE — ED Provider Notes (Signed)
Patient received from [place], [place].  Pt presented to ED w/ L-sided numbness.  No focal neuro deficits on exam.  CT head and labs unremarkable.  All results discussed w/ pt.  Her sx are stable.  She is concerned that this may be an adverse reaction to the medications recently prescribed by her cardiologist.  I recommended that she contact her cardiologist but also f/u with neuro.  Return precautions discussed.    Otilio Miu, PA-C 04/20/13 [phone]

## 2013-04-20 NOTE — ED Notes (Signed)
Pt called was given a referral to [facility] Neurology and called them this am and they told her they needed a referral before they could set up appt.  Current Clinical research associate has called and spoken w/[place] and they will contact pt.  Pt notified

## 2013-04-21 NOTE — ED Provider Notes (Signed)
Medical screening examination/treatment/procedure(s) were conducted as a shared visit with non-physician practitioner(s) and myself.  I personally evaluated the patient during the encounter Pt c/o intermittent bil arm and leg tingling, then moves to left, then right. No loss of normal function, movement or strength. Spine nt. Motor/sens intact bil.   Suzi Roots, MD 04/21/13 1640

## 2013-04-22 ENCOUNTER — Encounter (HOSPITAL_COMMUNITY)
Admission: RE | Admit: 2013-04-22 | Discharge: 2013-04-22 | Disposition: A | Discharge status: Home / Self Care | Payer: BC Managed Care – PPO | Source: Ambulatory Visit | Attending: Cardiovascular Disease | Admitting: Cardiovascular Disease

## 2013-04-22 NOTE — Progress Notes (Signed)
Cardiac Rehab Medication Review by a Pharmacist  Does the patient  feel that his/her medications are working for him/her?  yes  Has the patient been experiencing any side effects to the medications prescribed?  yes  Does the patient measure his/her own blood pressure or blood glucose at home?  yes   Does the patient have any problems obtaining medications due to transportation or finances?   no  Understanding of regimen: good Understanding of indications: good Potential of compliance: good    Pharmacist comments: [name] is a pleasant 42 yo female coming in for Cardiac Rehab.  Her ambulation to and from the interview room was unsteady and required support.  Side effects she is experiencing include dizziness, shaky on feet, numbness, and tingling.  She also reports seeing white when she closes her eyes.  [name] contributes these side effects to the newer med starts of Ranexa and Mexitil.  She has OTC acetaminophen, tramadol, and percocet at home.  She reports only taking the tramadol or percocet if the acetaminophen alone does not control her pain, they are not taken often.  The zofran she needs once daily to every other day.  She checks her blood pressure three to four times a day.   [name], PharmD Clinical Pharmacist - Resident Pager: [phone] Pharmacy: [phone] 04/22/2013 8:25 AM

## 2013-04-23 ENCOUNTER — Telehealth (HOSPITAL_COMMUNITY): Payer: Self-pay | Admitting: Cardiac Rehabilitation

## 2013-04-23 NOTE — Telephone Encounter (Signed)
Pt scheduled to begin cardiac rehab 04/26/13 however pt had recent ED visit with recommendation to f/u with cardiology and neurology.  Pc to pt to discuss need to cancel exercise session at this time. Pt states she has upcoming appt with [place] neuro on 05/03/13 and Dr. Ladona Ridgel 05/04/13.  Will need MD clearance prior to beginning exercise. Pt verbalized understanding

## 2013-04-26 ENCOUNTER — Encounter (HOSPITAL_COMMUNITY): Payer: BC Managed Care – PPO

## 2013-04-27 NOTE — ED Provider Notes (Signed)
Medical screening examination/treatment/procedure(s) were conducted as a shared visit with non-physician practitioner(s) and myself.  I personally evaluated the patient during the encounter Pt c/o bilateral hand numbness/tingling, then moved to right then to left. Notes hx same symptoms. Lab. Ct.     Suzi Roots, MD 04/27/13 [phone]

## 2013-04-28 ENCOUNTER — Encounter (HOSPITAL_COMMUNITY): Payer: BC Managed Care – PPO

## 2013-04-28 NOTE — Telephone Encounter (Signed)
Went to the ER this same day

## 2013-04-30 ENCOUNTER — Encounter (HOSPITAL_COMMUNITY): Payer: BC Managed Care – PPO

## 2013-05-03 ENCOUNTER — Encounter: Payer: Self-pay | Admitting: *Deleted

## 2013-05-03 ENCOUNTER — Encounter: Payer: Self-pay | Admitting: Neurology

## 2013-05-03 ENCOUNTER — Ambulatory Visit (INDEPENDENT_AMBULATORY_CARE_PROVIDER_SITE_OTHER): Payer: BC Managed Care – PPO | Admitting: Neurology

## 2013-05-03 ENCOUNTER — Encounter (HOSPITAL_COMMUNITY): Payer: BC Managed Care – PPO

## 2013-05-03 VITALS — BP 94/60 | HR 74 | Temp 98.1°F | Ht 60.0 in | Wt 193.0 lb

## 2013-05-03 DIAGNOSIS — IMO0002 Reserved for concepts with insufficient information to code with codable children: Secondary | ICD-10-CM

## 2013-05-03 DIAGNOSIS — R209 Unspecified disturbances of skin sensation: Secondary | ICD-10-CM

## 2013-05-03 DIAGNOSIS — M792 Neuralgia and neuritis, unspecified: Secondary | ICD-10-CM

## 2013-05-03 DIAGNOSIS — R200 Anesthesia of skin: Secondary | ICD-10-CM

## 2013-05-03 LAB — FOLATE: Folate: 9.4 ng/mL (ref >5.9)

## 2013-05-03 NOTE — Progress Notes (Signed)
NEUROLOGY CONSULTATION NOTE  [name] MRN: [id number] DOB: 1970-12-05  Referring provider: Dr. Denton Lank (ED) Primary care provider: Dr. Lacie Scotts  Reason for consult:  numbness  HISTORY OF PRESENT ILLNESS: [name] is a 42 y.o. female with history of hypertrophic obstructive cardiomyopathy with AICD s/p ablation, pericardial window 2/2 tamponade, anxiety and migraines who presents with paresthesia and numbness.  Records and images were personally reviewed, where available.  She was recently admitted to [facility] for 42 days for an ablation and possible heart transplant.  Unfortunately, she was not a candidate.  She underwent plasmapheresis and chemotherapy.  She also had several medication adjustments.  She presented to the ED on 04/19/13 after developing numbness of the head, arms and hands.  She has associated chronic headache, described as a humming in her head.  She also notes lightheadedness, which she attributes to medication changes.  She denies visual disturbance. She also complained of chest pain as well.  She denied visual disturbance or focal weakness.  She had headache, which is chronic for several months.  She also reported lightheadedness, which she attributes to having started a new heart medication.  ECG revealed a paced rhythm of 76 bpm with LBBB and non-specific T-wave changes.  CT of the brain was unremarkable.  TSH from 01/19/13 was 3.840.  Hgb A1c from 06/14/12 was 5.5.  Her cardiologist is Dr. Tonny Bollman.  PAST MEDICAL HISTORY: Past Medical History  Diagnosis Date  . Hypertrophic cardiomyopathy     s/p septal myomectomy with implantable defibrillator 01/27/2012   . Anxiety state, unspecified   . Allergic rhinitis     to dogs  . Asthma   . History of anemia     during pregnancy  . History of chicken pox   . Generalized headaches   . Ocular migraine     no HA  . Back pain     told cracked bone, vicodin didn't help, improved with percocets/muscle relaxants,  no eval by prior PCP  . 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 DVT (deep venous thrombosis)     Diagnosed 02/13/12  - was placed on Warfarin after cardiac surgery but had skin reaction to warfarin so was changed to Pradaxa so DVT occurred while on Pradaxa although unclear if it occurred during transition  . Hypotension   . Atrial fibrillation     a. On pradaxa.  coumadin necrosis with warfarin;  b. Amio d/c'd 02/2012  . ICD (implantable cardiac defibrillator) in place     02/04/2012, [employer]  . Pericardial effusion   . Chronic combined systolic and diastolic CHF (congestive heart failure)     a. echo 03/19/12: severe asymmetric septal hypertrophy, evidence of upper septal myomectomy, peak LV outflow tract gradient 35, EF 35%,;  b.  Echo 9/13: Severe LVH, EF 30-35%, anteroseptal and inferior HK, LVOT narrow, mild AI, mild to moderate mitral stenosis, severe LAE.  Marland Kitchen Pericardial tamponade 03/14/2012  . 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 01/2012 s/p BSX ICD;  b. 06/2012 Multaq initiated due to recurrent syncope, VT/VF - had severe n/v with IV amio.  . Skin necrosis --COUMADIN   . Chronic chest wall pain     eval by CVTS, stable, rec return to [facility] if continued pain  . Syncope     a. in setting of VT/VF.  Marland Kitchen Hypothyroid     PAST SURGICAL HISTORY: Past Surgical History  Procedure Laterality  Date  . Cesarean section  1991  . Induced abortion  1990 and 1993  . Cardiac surgery  01/27/2012    median sternotomy, septal myectomy, MVR - [facility] (Dr. Silvestre Mesi)  . Cardiac defibrillator placement  01/2012  . Myomectomy    . Pericardial window  03/14/2012    Procedure: PERICARDIAL WINDOW;  Surgeon: Purcell Nails, MD;  Location: [facility] OR;  Service: Open Heart Surgery;  Laterality: N/A;  Subxyphoid Pericardial  Window     MEDICATIONS: Current Outpatient Prescriptions on File Prior to Visit  Medication Sig Dispense Refill  . acetaminophen  (TYLENOL) 500 MG tablet Take 500 mg by mouth 3 (three) times daily as needed for pain.      Marland Kitchen ALPRAZolam (XANAX) 1 MG tablet Take 1 mg by mouth 3 (three) times daily as needed for anxiety.       Marland Kitchen aspirin EC 81 MG tablet Take 81 mg by mouth daily.      . beclomethasone (QVAR) 80 MCG/ACT inhaler Inhale 1 puff into the lungs 2 (two) times daily.      . cetirizine (ZYRTEC) 10 MG tablet Take 10 mg by mouth every evening.       . docusate sodium (STOOL SOFTENER) 100 MG capsule Take 100 mg by mouth daily as needed for constipation.       . furosemide (LASIX) 20 MG tablet Take 20 mg by mouth every other day.       . levothyroxine (SYNTHROID, LEVOTHROID) 50 MCG tablet Take 50 mcg by mouth daily before breakfast.       . magnesium gluconate (MAGONATE) 500 MG tablet Take 250 mg by mouth 2 (two) times daily.      . metoprolol succinate (TOPROL-XL) 25 MG 24 hr tablet Take 12.5 mg by mouth daily.       Marland Kitchen mexiletine (MEXITIL) 150 MG capsule Take 300 mg by mouth 3 (three) times daily.       . ondansetron (ZOFRAN) 4 MG tablet Take 4 mg by mouth every 12 (twelve) hours as needed for nausea.       Marland Kitchen oxyCODONE-acetaminophen (PERCOCET/ROXICET) 5-325 MG per tablet Take 1 tablet by mouth 3 (three) times daily as needed for pain.      Marland Kitchen Potassium Gluconate 595 MG CAPS Take 595 mg by mouth daily.      . ranolazine (RANEXA) 1000 MG SR tablet Take 1,000 mg by mouth 2 (two) times daily.       . Simethicone (GAS-X PO) Take 1 tablet by mouth 2 (two) times daily as needed (pressure).      . traMADol (ULTRAM) 50 MG tablet Take 50 mg by mouth 3 (three) times daily as needed for pain.       No current facility-administered medications on file prior to visit.    ALLERGIES: Allergies  Allergen Reactions  . Phenergan (Promethazine Hcl) Itching  . Amiodarone Nausea And Vomiting    N/V on IV amio 06/2012  . Warfarin And Related Other (See Comments)    Scars on skin    FAMILY HISTORY: Family History  Problem Relation Age  of Onset  . Diabetes Father   . Heart disease Father     unspecified heart problem  . Thyroid disease Mother   . Anemia Mother     mediterranean anemia, ITP  . Heart disease Maternal Grandfather     CHF  . Diabetes Paternal Grandfather   . Cancer Paternal Grandfather   . Coronary artery disease Neg Hx   .  Stroke Neg Hx   . Sudden death Neg Hx     SOCIAL HISTORY: History   Social History  . Marital Status: Single    Spouse Name: N/A    Number of Children: 1  . Years of Education: N/A   Occupational History  . Naval architect    Social History Main Topics  . Smoking status: Former Smoker -- 1.00 packs/day for 3 years    Types: Cigarettes    Quit date: 09/30/1988  . Smokeless tobacco: Never Used     Comment: quitin 1990  . Alcohol Use: No     Comment: none since "months" as of 02/28/2013  . Drug Use: No     Comment: quiti n 1989  . Sexually Active: Yes    Birth Control/ Protection: Pill   Other Topics Concern  . Not on file   Social History Narrative   Caffeine: 1 cup coffee/day   Divorced, 1 child.  lives with boyfriend, dogs   Occupation: Full time Tax adviser   Activity: walks 76min/day   Diet: fruits/vegetables daily, water, no fish    REVIEW OF SYSTEMS: Constitutional: No fevers, chills, or sweats, no generalized fatigue, change in appetite Eyes: No visual changes, double vision, eye pain Ear, nose and throat: No hearing loss, ear pain, nasal congestion, sore throat Cardiovascular: No chest pain, palpitations Respiratory:  No shortness of breath at rest or with exertion, wheezes GastrointestinaI: No nausea, vomiting, diarrhea, abdominal pain, fecal incontinence Genitourinary:  No dysuria, urinary retention or frequency Musculoskeletal:  No neck pain, back pain Integumentary: No rash, pruritus, skin lesions Neurological: as above Psychiatric: No depression, insomnia, anxiety Endocrine: No palpitations, fatigue, diaphoresis, mood  swings, change in appetite, change in weight, increased thirst Hematologic/Lymphatic:  No anemia, purpura, petechiae. Allergic/Immunologic: no itchy/runny eyes, nasal congestion, recent allergic reactions, rashes  PHYSICAL EXAM: Filed Vitals:   05/03/13 1252  BP: 94/60  Pulse: 74  Temp: 98.1 F (36.7 C)   General: No acute distress Head:  Normocephalic/atraumatic Neck: supple, no paraspinal tenderness, full range of motion Back: No paraspinal tenderness Heart: regular rate and rhythm Lungs: Clear to auscultation bilaterally. Vascular: No carotid bruits. Neurological Exam: Mental status: alert and oriented to person, place, time and self, speech fluent and not dysarthric, language intact. Cranial nerves: CN I: not tested CN II: pupils equal, round and reactive to light, visual fields intact, fundi unremarkable. CN III, IV, VI:  full range of motion, no nystagmus, no ptosis CN V: facial sensation intact CN VII: upper and lower face symmetric CN VIII: hearing intact CN IX, X: gag intact, uvula midline CN XI: sternocleidomastoid and trapezius muscles intact CN XII: tongue midline Bulk & Tone: normal, no fasciculations. Motor: 5/5 throughout Sensation: pinprick and vibration intact Deep Tendon Reflexes: depressed throughout Finger to nose testing: normal Gait: unsteady but able to walk in tandem. Romberg negative.  IMPRESSION & PLAN: Leone Mobley is a 42 year old woman with HOCM s/p AICD who presents with episode of numbness traveling from either side of her body.  I have no neurological answer to her symptoms.  Given the bilateral symptoms and traveling nature of the numbness, a TIA or stroke is unlikely.  Also, given that the symptoms are bilateral, simple partial seizures unlikely.  Peripheral neuropathy unlikely, given that the symptoms travel and are not constant.  Her arm and hand symptoms do not seem consistent with carpal tunnel syndrome.  Given tender points, consider also  a pain syndrome such as  fibromyalgia. 1.  We can check Lyme and a B12 level. 2. I would defer NCS-EMG testing at this time, since exam normal and symptoms are not consistent with a peripheral neuropathy 3. Will ask Dr. Excell Seltzer (her cardiologist) about contraindications to Neurontin, Lyrica or Cymbalta, as these medications would help address her pain.  45 minutes spent with patient, over 50% spent reviewing chart, counseling and coordinating care.  Thank you for allowing me to take part in the care of this patient.  Shon Millet, DO  CC:  Evelene Croon, MD  Tonny Bollman, MD

## 2013-05-03 NOTE — Patient Instructions (Addendum)
I'm not really sure what the cause could be.  It doesn't seem like a central nervous problem (brain and spinal cord).  Also, it doesn't quite fit the symptoms of a peripheral neuropathy.  But I think we should test for treatable causes of nerve pain, such as vitamin B12 and Lyme.  I will contact Dr. Excell Seltzer and ask if medications for nerve pain, such as gabapentin, Lyrica or Cymbalta, are okay to use.  Follow up in 3months.

## 2013-05-04 ENCOUNTER — Telehealth: Payer: Self-pay | Admitting: Neurology

## 2013-05-04 ENCOUNTER — Encounter: Payer: Self-pay | Admitting: Internal Medicine

## 2013-05-04 ENCOUNTER — Ambulatory Visit (INDEPENDENT_AMBULATORY_CARE_PROVIDER_SITE_OTHER): Payer: BC Managed Care – PPO | Admitting: Internal Medicine

## 2013-05-04 VITALS — BP 102/62 | HR 77 | Ht 60.0 in | Wt 196.8 lb

## 2013-05-04 DIAGNOSIS — I472 Ventricular tachycardia, unspecified: Secondary | ICD-10-CM

## 2013-05-04 DIAGNOSIS — I4901 Ventricular fibrillation: Secondary | ICD-10-CM

## 2013-05-04 DIAGNOSIS — Z95810 Presence of automatic (implantable) cardiac defibrillator: Secondary | ICD-10-CM

## 2013-05-04 LAB — ICD DEVICE OBSERVATION
AL AMPLITUDE: 5.4 mv
AL IMPEDENCE ICD: 595 Ohm
ATRIAL PACING ICD: 68 pct
DEVICE MODEL ICD: 102591
HV IMPEDENCE: 50 Ohm
RV LEAD IMPEDENCE ICD: 529 Ohm
RV LEAD THRESHOLD: 1 V
TZON-0003FASTVT: 352.9 ms
VENTRICULAR PACING ICD: 1 pct

## 2013-05-04 LAB — B. BURGDORFI ANTIBODIES: B burgdorferi Ab IgG+IgM: 1.2 {ISR} — ABNORMAL HIGH

## 2013-05-04 NOTE — Telephone Encounter (Signed)
Left a message for the patient to return my call.  

## 2013-05-04 NOTE — Telephone Encounter (Signed)
Message copied by Benay Spice on Tue May 04, 2013  3:43 PM ------      Message from: JAFFE, ADAM R      Created: Tue May 04, 2013  5:55 AM       Ms. Rosenow's B12 level is on the low side of normal.  I would like to check her methylmalonic acid level to see if there is possible B12 deficiency.  Thank you.      AJ      ----- Message -----         From: [facility]         Sent: 05/03/2013   4:08 PM           To: Cira Servant, DO                   ------

## 2013-05-04 NOTE — Assessment & Plan Note (Signed)
Her ventricular arrhythmias are currently controlled. She will continue her current medical therapy.

## 2013-05-04 NOTE — Patient Instructions (Addendum)
Your physician recommends that you schedule a follow-up appointment in: 3 months with Dr Taylor  

## 2013-05-04 NOTE — Assessment & Plan Note (Signed)
ICD interrogation today demonstrates normal function. Since returning home, she has had no recurrent sustained ventricular arrhythmias.

## 2013-05-04 NOTE — Progress Notes (Signed)
HPI [name] returns today for followup  He is a she is a very pleasant 42 year old woman with hypertrophic cardiomyopathy status post myomectomy, with a history of ventricular tachycardia, status post ICD implantation. She has had recurrent sustained ventricular arrhythmias and multiple ICD shocks, and presented to the hospital approximately 8 weeks ago with recurrent ICD shocks. She was transferred to [facility] where she had initially had her surgery and ICD implantation. She underwent unsuccessful catheter ablation of ventricular tachycardia. She then had her medications changed to high-dose mexiletine in conjunction with Ranexa. She has stabilized. She was evaluated for possible heart transplantation but apparently has antibodies making her a poor transplant candidate. She is appropriately anxious. Her shortness of breath has been well-controlled however and she denies peripheral edema. She does have some numbness and tingling. Allergies  Allergen Reactions  . Phenergan (Promethazine Hcl) Itching  . Amiodarone Nausea And Vomiting    N/V on IV amio 06/2012  . Warfarin And Related Other (See Comments)    Scars on skin     Current Outpatient Prescriptions  Medication Sig Dispense Refill  . acetaminophen (TYLENOL) 500 MG tablet Take 500 mg by mouth 3 (three) times daily as needed for pain.      Marland Kitchen ALPRAZolam (XANAX) 1 MG tablet Take 1 mg by mouth 3 (three) times daily as needed for anxiety.       Marland Kitchen aspirin EC 81 MG tablet Take 81 mg by mouth daily.      . beclomethasone (QVAR) 80 MCG/ACT inhaler Inhale 1 puff into the lungs 2 (two) times daily.      . cetirizine (ZYRTEC) 10 MG tablet Take 10 mg by mouth every evening.       . docusate sodium (STOOL SOFTENER) 100 MG capsule Take 100 mg by mouth daily as needed for constipation.       . furosemide (LASIX) 20 MG tablet Take 20 mg by mouth every other day.       . levothyroxine (SYNTHROID, LEVOTHROID) 50 MCG tablet Take 50 mcg  by mouth daily before breakfast.       . magnesium gluconate (MAGONATE) 500 MG tablet Take 250 mg by mouth 2 (two) times daily.      . metoprolol succinate (TOPROL-XL) 25 MG 24 hr tablet Take 12.5 mg by mouth daily.       Marland Kitchen mexiletine (MEXITIL) 150 MG capsule Take 300 mg by mouth 3 (three) times daily.       . ondansetron (ZOFRAN) 4 MG tablet Take 4 mg by mouth every 12 (twelve) hours as needed for nausea.       Marland Kitchen oxyCODONE-acetaminophen (PERCOCET/ROXICET) 5-325 MG per tablet Take 1 tablet by mouth 3 (three) times daily as needed for pain.      Marland Kitchen Potassium Gluconate 595 MG CAPS Take 595 mg by mouth daily.      . ranolazine (RANEXA) 1000 MG SR tablet Take 1,000 mg by mouth 2 (two) times daily.       . Simethicone (GAS-X PO) Take 1 tablet by mouth 2 (two) times daily as needed (pressure).      . traMADol (ULTRAM) 50 MG tablet Take 50 mg by mouth 3 (three) times daily as needed for pain.       No current facility-administered medications for this visit.     Past Medical History  Diagnosis Date  . Hypertrophic cardiomyopathy     s/p septal myomectomy with implantable defibrillator 01/27/2012   . Anxiety state, unspecified   .  Allergic rhinitis     to dogs  . Asthma   . History of anemia     during pregnancy  . History of chicken pox   . Generalized headaches   . Ocular migraine     no HA  . Back pain     told cracked bone, vicodin didn't help, improved with percocets/muscle relaxants, no eval by prior PCP  . 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 DVT (deep venous thrombosis)     Diagnosed 02/13/12  - was placed on Warfarin after cardiac surgery but had skin reaction to warfarin so was changed to Pradaxa so DVT occurred while on Pradaxa although unclear if it occurred during transition  . Hypotension   . Atrial fibrillation     a. On pradaxa.  coumadin necrosis with warfarin;  b. Amio d/c'd 02/2012  . ICD (implantable cardiac  defibrillator) in place     02/04/2012, [employer]  . Pericardial effusion   . Chronic combined systolic and diastolic CHF (congestive heart failure)     a. echo 03/19/12: severe asymmetric septal hypertrophy, evidence of upper septal myomectomy, peak LV outflow tract gradient 35, EF 35%,;  b.  Echo 9/13: Severe LVH, EF 30-35%, anteroseptal and inferior HK, LVOT narrow, mild AI, mild to moderate mitral stenosis, severe LAE.  Marland Kitchen Pericardial tamponade 03/14/2012  . 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 01/2012 s/p BSX ICD;  b. 06/2012 Multaq initiated due to recurrent syncope, VT/VF - had severe n/v with IV amio.  . Skin necrosis --COUMADIN   . Chronic chest wall pain     eval by CVTS, stable, rec return to [facility] if continued pain  . Syncope     a. in setting of VT/VF.  Marland Kitchen Hypothyroid     ROS:   All systems reviewed and negative except as noted in the HPI.   Past Surgical History  Procedure Laterality Date  . Cesarean section  1991  . Induced abortion  1990 and 1993  . Cardiac surgery  01/27/2012    median sternotomy, septal myectomy, MVR - [facility] (Dr. Silvestre Mesi)  . Cardiac defibrillator placement  01/2012  . Myomectomy    . Pericardial window  03/14/2012    Procedure: PERICARDIAL WINDOW;  Surgeon: Purcell Nails, MD;  Location: [facility] OR;  Service: Open Heart Surgery;  Laterality: N/A;  Subxyphoid Pericardial  Window      Family History  Problem Relation Age of Onset  . Diabetes Father   . Heart disease Father     unspecified heart problem  . Thyroid disease Mother   . Anemia Mother     mediterranean anemia, ITP  . Heart disease Maternal Grandfather     CHF  . Diabetes Paternal Grandfather   . Cancer Paternal Grandfather   . Coronary artery disease Neg Hx   . Stroke Neg Hx   . Sudden death Neg Hx      History   Social History  . Marital Status: Single    Spouse Name: N/A    Number of Children: 1  . Years of Education: N/A   Occupational History  . Psychologist, occupational    Social History Main Topics  . Smoking status: Former Smoker -- 1.00 packs/day for 3 years    Types: Cigarettes    Quit date: 09/30/1988  . Smokeless tobacco: Never Used     Comment: quitin 1990  . Alcohol  Use: No     Comment: none since "months" as of 02/28/2013  . Drug Use: No     Comment: quiti n 1989  . Sexually Active: Yes    Birth Control/ Protection: Pill   Other Topics Concern  . Not on file   Social History Narrative   Caffeine: 1 cup coffee/day   Divorced, 1 child.  lives with boyfriend, dogs   Occupation: Full time Tax adviser   Activity: walks 5min/day   Diet: fruits/vegetables daily, water, no fish     BP 102/62  Pulse 77  Ht 5' (1.524 m)  Wt 196 lb 12.8 oz (89.268 kg)  BMI 38.43 kg/m2  LMP 04/16/2013  Physical Exam:  stable appearing  Middle-age woman, NAD HEENT: Unremarkable Neck:  7 cm JVD, no thyromegally Lungs:  Clear with no wheezes, rales, or rhonchi. HEART:  Regular rate rhythm, no murmurs, no rubs, no clicks Abd:  soft, positive bowel sounds, no organomegally, no rebound, no guarding Ext:  2 plus pulses, no edema, no cyanosis, no clubbing Skin:  No rashes no nodules Neuro:  CN II through XII intact, motor grossly intact  DEVICE  Normal device function.  See PaceArt for details.   Assess/Plan:

## 2013-05-05 ENCOUNTER — Encounter (HOSPITAL_COMMUNITY): Payer: BC Managed Care – PPO

## 2013-05-05 ENCOUNTER — Other Ambulatory Visit: Payer: Self-pay | Admitting: Neurology

## 2013-05-05 DIAGNOSIS — R209 Unspecified disturbances of skin sensation: Secondary | ICD-10-CM

## 2013-05-05 DIAGNOSIS — M792 Neuralgia and neuritis, unspecified: Secondary | ICD-10-CM

## 2013-05-05 NOTE — Telephone Encounter (Signed)
Spoke with the patient. Information given as per Dr. Everlena Cooper re: Vit B12. Patient will come in for additional blood work (MMA). Order placed in EPIC.

## 2013-05-06 ENCOUNTER — Telehealth: Payer: Self-pay | Admitting: Neurology

## 2013-05-06 ENCOUNTER — Other Ambulatory Visit: Payer: BC Managed Care – PPO

## 2013-05-06 DIAGNOSIS — M792 Neuralgia and neuritis, unspecified: Secondary | ICD-10-CM

## 2013-05-06 DIAGNOSIS — R209 Unspecified disturbances of skin sensation: Secondary | ICD-10-CM

## 2013-05-06 LAB — B. BURGDORFI ANTIBODIES BY WB
B burgdorferi IgG Abs (IB): NEGATIVE
B burgdorferi IgM Abs (IB): NEGATIVE

## 2013-05-06 MED ORDER — GABAPENTIN 300 MG PO CAPS: 300.0000 mg | ORAL_CAPSULE | Freq: Every day | ORAL | Status: DC

## 2013-05-06 NOTE — Telephone Encounter (Signed)
Patient came in today for additional lab work. I informed her of Lyme test results as directed by Dr. Everlena Cooper and offered her a trial of Gabapentin 300 mg q HS as recommended by Dr. Everlena Cooper. The patient will try the medication. Will e-scribe to her pharmacy. No other questions/concerns at this time.

## 2013-05-06 NOTE — Telephone Encounter (Signed)
Message copied by Benay Spice on Thu May 06, 2013  1:38 PM ------      Message from: JAFFE, ADAM R      Created: Wed May 05, 2013 12:30 PM       Dr. Excell Seltzer said that gabapentin or Cymbalta would be okay to take, given her cardiac history.  If she is agreeable, we can try gabapentin 300mg  at bedtime.  This may help with any shooting pains.  Side effects include sleepiness or dizziness.      AJ ------

## 2013-05-07 ENCOUNTER — Encounter (HOSPITAL_COMMUNITY): Payer: BC Managed Care – PPO

## 2013-05-10 ENCOUNTER — Encounter (HOSPITAL_COMMUNITY): Payer: BC Managed Care – PPO

## 2013-05-10 LAB — METHYLMALONIC ACID, SERUM: Methylmalonic Acid, Quant: 0.17 umol/L (ref <0.40)

## 2013-05-11 ENCOUNTER — Encounter: Payer: Self-pay | Admitting: Neurology

## 2013-05-12 ENCOUNTER — Encounter (HOSPITAL_COMMUNITY): Payer: BC Managed Care – PPO

## 2013-05-14 ENCOUNTER — Encounter (HOSPITAL_COMMUNITY): Payer: BC Managed Care – PPO

## 2013-05-15 ENCOUNTER — Encounter (HOSPITAL_COMMUNITY): Payer: Self-pay | Admitting: Emergency Medicine

## 2013-05-15 ENCOUNTER — Emergency Department (HOSPITAL_COMMUNITY): Payer: BC Managed Care – PPO

## 2013-05-15 ENCOUNTER — Observation Stay (HOSPITAL_COMMUNITY)
Admission: EM | Admit: 2013-05-15 | Discharge: 2013-05-16 | Disposition: A | Discharge status: Home / Self Care | Payer: BC Managed Care – PPO | Attending: Cardiology | Admitting: Cardiology

## 2013-05-15 DIAGNOSIS — Z7901 Long term (current) use of anticoagulants: Secondary | ICD-10-CM

## 2013-05-15 DIAGNOSIS — R079 Chest pain, unspecified: Secondary | ICD-10-CM

## 2013-05-15 DIAGNOSIS — Z952 Presence of prosthetic heart valve: Secondary | ICD-10-CM

## 2013-05-15 DIAGNOSIS — Z95810 Presence of automatic (implantable) cardiac defibrillator: Secondary | ICD-10-CM | POA: Insufficient documentation

## 2013-05-15 DIAGNOSIS — I509 Heart failure, unspecified: Secondary | ICD-10-CM | POA: Insufficient documentation

## 2013-05-15 DIAGNOSIS — I498 Other specified cardiac arrhythmias: Secondary | ICD-10-CM | POA: Insufficient documentation

## 2013-05-15 DIAGNOSIS — I5042 Chronic combined systolic (congestive) and diastolic (congestive) heart failure: Secondary | ICD-10-CM | POA: Diagnosis present

## 2013-05-15 DIAGNOSIS — R002 Palpitations: Secondary | ICD-10-CM

## 2013-05-15 DIAGNOSIS — I422 Other hypertrophic cardiomyopathy: Secondary | ICD-10-CM | POA: Diagnosis present

## 2013-05-15 DIAGNOSIS — Z954 Presence of other heart-valve replacement: Secondary | ICD-10-CM | POA: Insufficient documentation

## 2013-05-15 DIAGNOSIS — E039 Hypothyroidism, unspecified: Secondary | ICD-10-CM | POA: Diagnosis present

## 2013-05-15 DIAGNOSIS — E861 Hypovolemia: Principal | ICD-10-CM

## 2013-05-15 DIAGNOSIS — Z79899 Other long term (current) drug therapy: Secondary | ICD-10-CM | POA: Insufficient documentation

## 2013-05-15 DIAGNOSIS — Z86718 Personal history of other venous thrombosis and embolism: Secondary | ICD-10-CM

## 2013-05-15 DIAGNOSIS — R000 Tachycardia, unspecified: Secondary | ICD-10-CM

## 2013-05-15 DIAGNOSIS — I959 Hypotension, unspecified: Secondary | ICD-10-CM | POA: Insufficient documentation

## 2013-05-15 LAB — BASIC METABOLIC PANEL
BUN: 12 mg/dL (ref 6–23)
CO2: 23 mEq/L (ref 19–32)
Calcium: 8.2 mg/dL — ABNORMAL LOW (ref 8.4–10.5)
Chloride: 104 mEq/L (ref 96–112)
Creatinine, Ser: 0.8 mg/dL (ref 0.50–1.10)
GFR calc Af Amer: >90 mL/min (ref >90)
GFR calc non Af Amer: 90 mL/min — ABNORMAL LOW (ref >90)
Glucose, Bld: 91 mg/dL (ref 70–99)
Potassium: 3.9 mEq/L (ref 3.5–5.1)
Sodium: 137 mEq/L (ref 135–145)

## 2013-05-15 LAB — CBC
HCT: 36.2 % (ref 36.0–46.0)
Hemoglobin: 12.1 g/dL (ref 12.0–15.0)
MCH: 27.4 pg (ref 26.0–34.0)
MCHC: 33.4 g/dL (ref 30.0–36.0)
MCV: 81.9 fL (ref 78.0–100.0)
Platelets: 295 10*3/uL (ref 150–400)
RBC: 4.42 MIL/uL (ref 3.87–5.11)
RDW: 13.9 % (ref 11.5–15.5)
WBC: 6.5 10*3/uL (ref 4.0–10.5)

## 2013-05-15 LAB — TROPONIN I: Troponin I: <0.3 ng/mL (ref <0.30)

## 2013-05-15 MED ORDER — HEPARIN SODIUM (PORCINE) 5000 UNIT/ML IJ SOLN
5000.0000 [IU] | Freq: Three times a day (TID) | INTRAMUSCULAR | Status: DC
  Filled 2013-05-15: qty 1

## 2013-05-15 MED ORDER — MAGNESIUM GLUCONATE 500 MG PO TABS
250.0000 mg | ORAL_TABLET | Freq: Two times a day (BID) | ORAL | Status: DC
  Administered 2013-05-16: 250 mg via ORAL
  Filled 2013-05-15 (×2): qty 1

## 2013-05-15 MED ORDER — ACETAMINOPHEN 325 MG PO TABS: 650.0000 mg | ORAL_TABLET | ORAL | Status: DC | PRN

## 2013-05-15 MED ORDER — FLUTICASONE PROPIONATE HFA 44 MCG/ACT IN AERO
1.0000 | INHALATION_SPRAY | Freq: Two times a day (BID) | RESPIRATORY_TRACT | Status: DC
  Filled 2013-05-15: qty 10.6

## 2013-05-15 MED ORDER — SODIUM CHLORIDE 0.9 % IV SOLN
INTRAVENOUS | Status: AC
  Administered 2013-05-16: 05:00:00 via INTRAVENOUS

## 2013-05-15 MED ORDER — RANOLAZINE ER 500 MG PO TB12: 1000.0000 mg | ORAL_TABLET | Freq: Two times a day (BID) | ORAL | Status: DC

## 2013-05-15 MED ORDER — LEVOTHYROXINE SODIUM 50 MCG PO TABS
50.0000 ug | ORAL_TABLET | Freq: Every day | ORAL | Status: DC
  Administered 2013-05-16: 50 ug via ORAL
  Filled 2013-05-15 (×3): qty 1

## 2013-05-15 MED ORDER — LORATADINE 10 MG PO TABS
10.0000 mg | ORAL_TABLET | Freq: Every day | ORAL | Status: DC
  Filled 2013-05-15: qty 1

## 2013-05-15 MED ORDER — SODIUM CHLORIDE 0.9 % IV BOLUS (SEPSIS)
250.0000 mL | Freq: Once | INTRAVENOUS | Status: AC
  Administered 2013-05-15: 250 mL via INTRAVENOUS

## 2013-05-15 MED ORDER — POTASSIUM GLUCONATE 595 MG PO CAPS: 595.0000 mg | ORAL_CAPSULE | Freq: Every day | ORAL | Status: DC

## 2013-05-15 MED ORDER — MEXILETINE HCL 150 MG PO CAPS
300.0000 mg | ORAL_CAPSULE | Freq: Three times a day (TID) | ORAL | Status: DC
  Filled 2013-05-15 (×4): qty 2

## 2013-05-15 MED ORDER — ASPIRIN EC 81 MG PO TBEC
81.0000 mg | DELAYED_RELEASE_TABLET | Freq: Every day | ORAL | Status: DC
  Administered 2013-05-16: 81 mg via ORAL
  Filled 2013-05-15: qty 1

## 2013-05-15 MED ORDER — METOPROLOL SUCCINATE 12.5 MG HALF TABLET
12.5000 mg | ORAL_TABLET | Freq: Every day | ORAL | Status: DC
  Administered 2013-05-16: 12.5 mg via ORAL
  Filled 2013-05-15: qty 1

## 2013-05-15 MED ORDER — ONDANSETRON HCL 4 MG/2ML IJ SOLN
4.0000 mg | Freq: Four times a day (QID) | INTRAMUSCULAR | Status: DC | PRN
  Filled 2013-05-15: qty 2

## 2013-05-15 MED ORDER — GABAPENTIN 300 MG PO CAPS
300.0000 mg | ORAL_CAPSULE | Freq: Every day | ORAL | Status: DC
  Filled 2013-05-15: qty 1

## 2013-05-15 MED ORDER — ALPRAZOLAM 0.5 MG PO TABS
1.0000 mg | ORAL_TABLET | Freq: Three times a day (TID) | ORAL | Status: DC | PRN
  Administered 2013-05-16: 1 mg via ORAL
  Filled 2013-05-15: qty 2
  Filled 2013-05-15: qty 1

## 2013-05-15 NOTE — ED Provider Notes (Signed)
CSN: [id number]     Arrival date & time 05/15/13  1617 History     First MD Initiated Contact with Patient 05/15/13 1635     Chief Complaint  Patient presents with  . Chest Pain   (Consider location/radiation/quality/duration/timing/severity/associated sxs/prior Treatment) HPI Pt is a 42yo female with hx of chronic combined systolic and diastolic CHF with ICD in place c/o substernal chest pain started around 1530 this afternoon while cleaning.  Pt has ICD in place, set at rate of 75 and is on heart transplant list at [facility]. Pt states she felt a run of PVCs and experienced lightheadedness for about 10-14min associated with substernal chest pressure.  States she checked her heart rate which was about 108, so she sat down to try to relax but when she stood up again felt very lightheaded and had to sit back down.  Denies feeling diaphoretic or nauseated.  Denies feeling defibrillator go off. Denies CP at this time.  Denies SOB, cough, or leg swelling.  Pt is seen by [facility] cardiology.    Past Medical History  Diagnosis Date  . Hypertrophic cardiomyopathy     s/p septal myomectomy with implantable defibrillator 01/27/2012   . Anxiety state, unspecified   . Allergic rhinitis     to dogs  . Asthma   . History of anemia     during pregnancy  . History of chicken pox   . Generalized headaches   . Ocular migraine     no HA  . Back pain     told cracked bone, vicodin didn't help, improved with percocets/muscle relaxants, no eval by prior PCP  . 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 DVT (deep venous thrombosis)     Diagnosed 02/13/12  - was placed on Warfarin after cardiac surgery but had skin reaction to warfarin so was changed to Pradaxa so DVT occurred while on Pradaxa although unclear if it occurred during transition  . Hypotension   . Atrial fibrillation     a. On pradaxa.  coumadin necrosis with warfarin;  b. Amio d/c'd 02/2012  . ICD  (implantable cardiac defibrillator) in place     02/04/2012, [employer]  . Pericardial effusion   . Chronic combined systolic and diastolic CHF (congestive heart failure)     a. echo 03/19/12: severe asymmetric septal hypertrophy, evidence of upper septal myomectomy, peak LV outflow tract gradient 35, EF 35%,;  b.  Echo 9/13: Severe LVH, EF 30-35%, anteroseptal and inferior HK, LVOT narrow, mild AI, mild to moderate mitral stenosis, severe LAE.  Marland Kitchen Pericardial tamponade 03/14/2012  . 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 01/2012 s/p BSX ICD;  b. 06/2012 Multaq initiated due to recurrent syncope, VT/VF - had severe n/v with IV amio.  . Skin necrosis --COUMADIN   . Chronic chest wall pain     eval by CVTS, stable, rec return to [facility] if continued pain  . Syncope     a. in setting of VT/VF.  Marland Kitchen Hypothyroid    Past Surgical History  Procedure Laterality Date  . Cesarean section  1991  . Induced abortion  1990 and 1993  . Cardiac surgery  01/27/2012    median sternotomy, septal myectomy, MVR - [facility] (Dr. Silvestre Mesi)  . Cardiac defibrillator placement  01/2012  . Myomectomy    . Pericardial window  03/14/2012    Procedure: PERICARDIAL WINDOW;  Surgeon: Purcell Nails, MD;  Location: [facility] OR;  Service: Open Heart Surgery;  Laterality: N/A;  Subxyphoid Pericardial  Window    Family History  Problem Relation Age of Onset  . Diabetes Father   . Heart disease Father     unspecified heart problem  . Thyroid disease Mother   . Anemia Mother     mediterranean anemia, ITP  . Heart disease Maternal Grandfather     CHF  . Diabetes Paternal Grandfather   . Cancer Paternal Grandfather   . Coronary artery disease Neg Hx   . Stroke Neg Hx   . Sudden death Neg Hx    History  Substance Use Topics  . Smoking status: Former Smoker -- 1.00 packs/day for 3 years    Types: Cigarettes    Quit date: 09/30/1988  . Smokeless tobacco: Never Used     Comment: quitin 1990  . Alcohol Use: No     Comment:  none since "months" as of 02/28/2013   OB History   Grav Para Term Preterm Abortions TAB SAB Ect Mult Living                 Review of Systems  Constitutional: Negative for fever, chills and diaphoresis.  Respiratory: Negative for cough and shortness of breath.   Cardiovascular: Positive for chest pain and palpitations. Negative for leg swelling.  Gastrointestinal: Negative for nausea, vomiting and diarrhea.  Neurological: Positive for light-headedness. Negative for dizziness and syncope.  All other systems reviewed and are negative.    Allergies  Phenergan; Amiodarone; and Warfarin and related  Home Medications   Current Outpatient Rx  Name  Route  Sig  Dispense  Refill  . acetaminophen (TYLENOL) 500 MG tablet   Oral   Take 500 mg by mouth 3 (three) times daily as needed for pain.         Marland Kitchen ALPRAZolam (XANAX) 1 MG tablet   Oral   Take 1 mg by mouth 3 (three) times daily as needed for anxiety.          Marland Kitchen aspirin EC 81 MG tablet   Oral   Take 81 mg by mouth daily.         . beclomethasone (QVAR) 80 MCG/ACT inhaler   Inhalation   Inhale 1 puff into the lungs 2 (two) times daily.         . cetirizine (ZYRTEC) 10 MG tablet   Oral   Take 10 mg by mouth every evening.          . gabapentin (NEURONTIN) 300 MG capsule   Oral   Take 1 capsule (300 mg total) by mouth at bedtime.   30 capsule   2   . levothyroxine (SYNTHROID, LEVOTHROID) 50 MCG tablet   Oral   Take 50 mcg by mouth daily before breakfast.          . magnesium gluconate (MAGONATE) 500 MG tablet   Oral   Take 250 mg by mouth 2 (two) times daily.         . metoprolol succinate (TOPROL-XL) 25 MG 24 hr tablet   Oral   Take 12.5 mg by mouth daily.          Marland Kitchen mexiletine (MEXITIL) 150 MG capsule   Oral   Take 300 mg by mouth 3 (three) times daily.          Marland Kitchen oxyCODONE-acetaminophen (PERCOCET/ROXICET) 5-325 MG per tablet   Oral   Take 1 tablet by mouth 3 (three) times daily as  needed for  pain.         Marland Kitchen Potassium Gluconate 595 MG CAPS   Oral   Take 595 mg by mouth daily.         . ranolazine (RANEXA) 1000 MG SR tablet   Oral   Take 1,000 mg by mouth 2 (two) times daily.          . Simethicone (GAS-X PO)   Oral   Take 1 tablet by mouth 2 (two) times daily as needed (pressure).         . traMADol (ULTRAM) 50 MG tablet   Oral   Take 50 mg by mouth 3 (three) times daily as needed for pain.          BP 100/60  Pulse 75  Temp(Src) 98.8 F (37.1 C) (Oral)  Resp 17  SpO2 99%  LMP 04/16/2013 Physical Exam  Nursing note and vitals reviewed. Constitutional: She appears well-developed and well-nourished. No distress.  Obese female lying in exam bed, NAD.   HENT:  Head: Normocephalic and atraumatic.  Eyes: Conjunctivae are normal. No scleral icterus.  Neck: Normal range of motion.  Cardiovascular: Normal rate, regular rhythm and normal heart sounds.   Pulmonary/Chest: Effort normal and breath sounds normal. No respiratory distress. She has no wheezes. She has no rales. She exhibits no tenderness.  Well healed mid-sternal surgical scar   Abdominal: Soft. Bowel sounds are normal. She exhibits no distension and no mass. There is no tenderness. There is no rebound and no guarding.  Musculoskeletal: Normal range of motion.  Neurological: She is alert.  Skin: Skin is warm and dry. She is not diaphoretic.    ED Course   Procedures (including critical care time)  Labs Reviewed  BASIC METABOLIC PANEL - Abnormal; Notable for the following:    Calcium 8.2 (*)    GFR calc non Af Amer 90 (*)    All other components within normal limits  TROPONIN I  CBC  TROPONIN I  TROPONIN I  TROPONIN I  CBC  BASIC METABOLIC PANEL  CBC  CREATININE, SERUM   Dg Chest Port 1 View  05/15/2013   *RADIOLOGY REPORT*  Clinical Data: Chest pain  PORTABLE CHEST - 1 VIEW  Comparison: 04/19/2013  Findings:   Cardiomegaly again noted.  Three leads cardiac pacemaker is unchanged in  position.  Status post median sternotomy. No acute infiltrate or pulmonary edema.  IMPRESSION: No active disease.  No significant change.   Original Report Authenticated By: Natasha Mead, M.D.     No diagnosis found.  MDM  Pt with hx of CHF due to cardiomyopathy and HTN with ICD presented with palpitations and lightheadedness.  Pt hypotensive upon arrival, likely due to nitro given by EMS.  Fluid bolus given, which increased BP some. Labs: unremarkable.  CXR: unremarkable.   ICD: showed some runs of atrial tachycardia around time pt excresencing symptoms   BP continues to stay hypotensive.  Will get orthostatic BP.  11:54 PM Pressure remains low, however pt is asymptomatic.  Will call [facility].  Plan is to discharge pt home.  Will consult for further direction as far as pt's lower BP.     Pt was evaluated by [facility] Cardiology who states they would like to admit pt for observation due to her continued low BP.    Junius Finner, PA-C 05/15/13 2354

## 2013-05-15 NOTE — ED Provider Notes (Addendum)
Medical screening examination/treatment/procedure(s) were conducted as a shared visit with non-physician practitioner(s) and myself.  I personally evaluated the patient during the encounter  EKG at time 16:25 shows SR with intermittent atrial pacing at rate 79, LBBB.  Abn ECG, no sig change from ECG on 04/16/13.  Pt with near syncope, dizziness, did not feel defib go off.  Occasional PVC's noted on monitor.  Pt did have CP and pressure, seems anxious.  EMS gave ASA and NTG and pt's BP dropped into 70's.  Pt felt lightheaded, but with time, BP has normalized.  She had an office visit on 8/5 14 with Dr. Ladona Ridgel and was doing well at that time, had it interrogated then as well.  Otherwise, physically, appears well.    Impression: Near syncope CP   Gavin Pound. Anahlia Iseminger, MD 05/15/13 1739   12:11 AM Cardiology fellow has seen . Due to borderline BP's, will admit for IVF's overnight and observation on tele.  Gavin Pound. Oletta Lamas, MD 05/16/13 1610

## 2013-05-15 NOTE — ED Notes (Signed)
Pt's husband stepped out of room to inform staff that pts bp is 60s systolically. Took bp again, pressure 76/44. Melony Overly, RN at bedside

## 2013-05-15 NOTE — ED Notes (Signed)
[facility] called and states they are en route.

## 2013-05-15 NOTE — ED Notes (Signed)
Cardiology at bedside.

## 2013-05-15 NOTE — ED Notes (Signed)
Erin PA at bedside 

## 2013-05-15 NOTE — ED Notes (Signed)
Pt c/o substernal cp that started around 3:30pm after cleaning. Pt states she felt palpitations and decided to stop cleaning and rest. Pt states she has a pacemaker/defibrillator set at a rate of 75 and is on the Heart transplant list. Pt denies any sob, nausea, vomiting, or lightheadedness. Pt was given 324 asa, and 1 nitro with some relief but pressure dropped to 90's. 20g LH, 90/42, 86 HR, 99% RA, 16 resp.

## 2013-05-15 NOTE — ED Notes (Signed)
Dr Oletta Lamas, and Denny Peon PA notified of pt's b/p dropping to 76/44.

## 2013-05-15 NOTE — H&P (Signed)
Physician History and Physical    [name] MRN: [id number] DOB/AGE: Jan 30, 1971 42 y.o. Admit date: 05/15/2013  Primary Care Physician:  Evelene Croon, MD Primary Cardiologist:  Reece Agar. Ladona Ridgel, MD   CC:  Chest pain/palpitation/hypotension  HPI:  42 y.o. female with PMHx s/f hypertrophic cardiomyopathy (s/p septal myomectomy and [employer] ICD 01/27/12), s/p (MVR at [facility]), recurrent VF with recurrent ICD discharges, chronic systolic CHF (EF 40-98%), PAF, h/o DVT, asthma and hypothyroidism who presents to [facility] ED today with palpitation, chest pain and near syncope. She was found to be hypotensive with systolic of 70's after nitro given by EMS.  Device interrogation showed 3 episodes of Atach when pt was symptomatic. Her bp has minimal response to IV bolus of 500 ml. She states her chest pain correlates with her palpitation and dizziness, which is currently resolved. She endorsed decreased PO intake last 2 days and continuation of her Lasix.    Review of systems: A review of 10 organ systems was done and is negative except as stated above in HPI  Past Medical History  Diagnosis Date  . Hypertrophic cardiomyopathy     s/p septal myomectomy with implantable defibrillator 01/27/2012   . Anxiety state, unspecified   . Allergic rhinitis     to dogs  . Asthma   . History of anemia     during pregnancy  . History of chicken pox   . Generalized headaches   . Ocular migraine     no HA  . Back pain     told cracked bone, vicodin didn't help, improved with percocets/muscle relaxants, no eval by prior PCP  . 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 DVT (deep venous thrombosis)     Diagnosed 02/13/12  - was placed on Warfarin after cardiac surgery but had skin reaction to warfarin so was changed to Pradaxa so DVT occurred while on Pradaxa although unclear if it occurred during transition  . Hypotension   . Atrial fibrillation      a. On pradaxa.  coumadin necrosis with warfarin;  b. Amio d/c'd 02/2012  . ICD (implantable cardiac defibrillator) in place     02/04/2012, [employer]  . Pericardial effusion   . Chronic combined systolic and diastolic CHF (congestive heart failure)     a. echo 03/19/12: severe asymmetric septal hypertrophy, evidence of upper septal myomectomy, peak LV outflow tract gradient 35, EF 35%,;  b.  Echo 9/13: Severe LVH, EF 30-35%, anteroseptal and inferior HK, LVOT narrow, mild AI, mild to moderate mitral stenosis, severe LAE.  Marland Kitchen Pericardial tamponade 03/14/2012  . 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 01/2012 s/p BSX ICD;  b. 06/2012 Multaq initiated due to recurrent syncope, VT/VF - had severe n/v with IV amio.  . Skin necrosis --COUMADIN   . Chronic chest wall pain     eval by CVTS, stable, rec return to [facility] if continued pain  . Syncope     a. in setting of VT/VF.  Marland Kitchen Hypothyroid    Past Surgical History  Procedure Laterality Date  . Cesarean section  1991  . Induced abortion  1990 and 1993  . Cardiac surgery  01/27/2012    median sternotomy, septal myectomy, MVR - [facility] (Dr. Silvestre Mesi)  . Cardiac defibrillator placement  01/2012  . Myomectomy    . Pericardial window  03/14/2012    Procedure: PERICARDIAL WINDOW;  Surgeon: Purcell Nails, MD;  Location: [facility] OR;  Service: Open Heart Surgery;  Laterality: N/A;  Subxyphoid Pericardial  Window    History   Social History  . Marital Status: Single    Spouse Name: N/A    Number of Children: 1  . Years of Education: N/A   Occupational History  . Naval architect    Social History Main Topics  . Smoking status: Former Smoker -- 1.00 packs/day for 3 years    Types: Cigarettes    Quit date: 09/30/1988  . Smokeless tobacco: Never Used     Comment: quitin 1990  . Alcohol Use: No     Comment: none since "months" as of 02/28/2013  . Drug Use: No     Comment: quiti n 1989  . Sexual Activity: Yes    Birth Control/ Protection: Pill     Other Topics Concern  . Not on file   Social History Narrative   Caffeine: 1 cup coffee/day   Divorced, 1 child.  lives with boyfriend, dogs   Occupation: Full time Tax adviser   Activity: walks 46min/day   Diet: fruits/vegetables daily, water, no fish    Family History  Problem Relation Age of Onset  . Diabetes Father   . Heart disease Father     unspecified heart problem  . Thyroid disease Mother   . Anemia Mother     mediterranean anemia, ITP  . Heart disease Maternal Grandfather     CHF  . Diabetes Paternal Grandfather   . Cancer Paternal Grandfather   . Coronary artery disease Neg Hx   . Stroke Neg Hx   . Sudden death Neg Hx      Allergies  Allergen Reactions  . Phenergan [Promethazine Hcl] Itching  . Amiodarone Nausea And Vomiting    N/V on IV amio 06/2012  . Warfarin And Related Other (See Comments)    Scars on skin     (Not in a hospital admission)  No current facility-administered medications for this encounter. Current outpatient prescriptions:acetaminophen (TYLENOL) 500 MG tablet, Take 500 mg by mouth 3 (three) times daily as needed for pain., Disp: , Rfl: ;  ALPRAZolam (XANAX) 1 MG tablet, Take 1 mg by mouth 3 (three) times daily as needed for anxiety. , Disp: , Rfl: ;  aspirin EC 81 MG tablet, Take 81 mg by mouth daily., Disp: , Rfl: ;  beclomethasone (QVAR) 80 MCG/ACT inhaler, Inhale 1 puff into the lungs 2 (two) times daily., Disp: , Rfl:  cetirizine (ZYRTEC) 10 MG tablet, Take 10 mg by mouth every evening. , Disp: , Rfl: ;  furosemide (LASIX) 20 MG tablet, Take 20 mg by mouth every other day. , Disp: , Rfl: ;  gabapentin (NEURONTIN) 300 MG capsule, Take 1 capsule (300 mg total) by mouth at bedtime., Disp: 30 capsule, Rfl: 2;  levothyroxine (SYNTHROID, LEVOTHROID) 50 MCG tablet, Take 50 mcg by mouth daily before breakfast. , Disp: , Rfl:  magnesium gluconate (MAGONATE) 500 MG tablet, Take 250 mg by mouth 2 (two) times daily., Disp: , Rfl:  ;  metoprolol succinate (TOPROL-XL) 25 MG 24 hr tablet, Take 12.5 mg by mouth daily. , Disp: , Rfl: ;  mexiletine (MEXITIL) 150 MG capsule, Take 300 mg by mouth 3 (three) times daily. , Disp: , Rfl: ;  oxyCODONE-acetaminophen (PERCOCET/ROXICET) 5-325 MG per tablet, Take 1 tablet by mouth 3 (three) times daily as needed for pain., Disp: , Rfl:  Potassium Gluconate 595 MG CAPS, Take 595 mg by mouth daily., Disp: ,  Rfl: ;  ranolazine (RANEXA) 1000 MG SR tablet, Take 1,000 mg by mouth 2 (two) times daily. , Disp: , Rfl: ;  Simethicone (GAS-X PO), Take 1 tablet by mouth 2 (two) times daily as needed (pressure)., Disp: , Rfl: ;  traMADol (ULTRAM) 50 MG tablet, Take 50 mg by mouth 3 (three) times daily as needed for pain., Disp: , Rfl:   Physical Exam: Blood pressure 100/60, pulse 75, temperature 98.8 F (37.1 C), temperature source Oral, resp. rate 17, last menstrual period 04/16/2013, SpO2 99.00%.; There is no weight on file to calculate BMI. Temp:  [98.8 F (37.1 C)] 98.8 F (37.1 C) (08/16 1634) Pulse Rate:  [74-92] 75 (08/16 2315) Resp:  [12-23] 17 (08/16 2315) BP: (77-107)/(43-66) 100/60 mmHg (08/16 2315) SpO2:  [97 %-100 %] 99 % (08/16 2315)   Intake/Output Summary (Last 24 hours) at 05/15/13 2343 Last data filed at 05/15/13 1854  Gross per 24 hour  Intake    222 ml  Output      0 ml  Net    222 ml   General: NAD Heent: MMM Neck: No JVD  CV: Nondisplaced PMI.  RRR, nl S1/S2, no S3/S4, no murmur. No carotid bruit   Lungs: Clear to auscultation bilaterally with normal respiratory effort Abdomen: Soft, nontender, nondistended Extremities: No clubbing or cyanosis.  Normal pedal pulses. No pedal edema Skin: Intact without lesions or rashes  Neurologic: Alert and oriented x 3, grossly nonfocal  Psych: Normal mood and affect    Labs:  Recent Labs  05/15/13 1745  TROPONINI <0.30   Lab Results  Component Value Date   WBC 6.5 05/15/2013   HGB 12.1 05/15/2013   HCT 36.2 05/15/2013    MCV 81.9 05/15/2013   PLT 295 05/15/2013    Recent Labs Lab 05/15/13 1745  NA 137  K 3.9  CL 104  CO2 23  BUN 12  CREATININE 0.80  CALCIUM 8.2*  GLUCOSE 91   No results found for this basename: CHOL, HDL, LDLCALC, TRIG       EKG:  Sinus with LBBB and LAE, no sig changes compared to previous ECG in7/2014 ICD interrogation:  3 episodes of Atach with HR 170-190 bpm Sensitivity: A 6.1 mv, V 15.2 mv,  Impedance A602, V 515 Threshold: A 0.7 at 0.5 ms, V 1.0v at 0.5 ms.   Radiology:  Dg Chest Port 1 View  05/15/2013   *RADIOLOGY REPORT*  Clinical Data: Chest pain  PORTABLE CHEST - 1 VIEW  Comparison: 04/19/2013  Findings:   Cardiomegaly again noted.  Three leads cardiac pacemaker is unchanged in position.  Status post median sternotomy. No acute infiltrate or pulmonary edema.  IMPRESSION: No active disease.  No significant change.   Original Report Authenticated By: Natasha Mead, M.D.    ASSESSMENT:   1. Atrial tachycardia, symptomatic 2. Near syncope 2/2 hypotension with A-tach and hypovolemia in the setting of HOCM 3. Chest pain likely related #1, rule out ACS   PLAN:  1. Cycle cardiac enzymes times 3 2. Telemetry observation o/n 3. Continue gentle IVF, will give another 1L, total 1.5 L, for hypovolemia and HOCM, hold Lasix for now, avoid Nitro. 4. Continue Metoprolol and Mexilitine if BP tolerates. 5. If troponin negative and BP improves, can d/c home tomorrow and outpatient follow up with Dr. Ladona Ridgel   Signed: Haydee Salter, MD Cardiology Fellow 05/15/2013, 11:43 PM

## 2013-05-15 NOTE — ED Notes (Signed)
Pt states she was having some palpitations.

## 2013-05-16 ENCOUNTER — Encounter (HOSPITAL_COMMUNITY): Payer: Self-pay | Admitting: Nurse Practitioner

## 2013-05-16 DIAGNOSIS — E861 Hypovolemia: Secondary | ICD-10-CM

## 2013-05-16 DIAGNOSIS — R079 Chest pain, unspecified: Secondary | ICD-10-CM

## 2013-05-16 DIAGNOSIS — I959 Hypotension, unspecified: Secondary | ICD-10-CM

## 2013-05-16 DIAGNOSIS — Z95810 Presence of automatic (implantable) cardiac defibrillator: Secondary | ICD-10-CM

## 2013-05-16 LAB — CREATININE, SERUM
Creatinine, Ser: 0.73 mg/dL (ref 0.50–1.10)
GFR calc Af Amer: >90 mL/min (ref >90)
GFR calc non Af Amer: >90 mL/min (ref >90)

## 2013-05-16 LAB — CBC
HCT: 35.8 % — ABNORMAL LOW (ref 36.0–46.0)
HCT: 36.5 % (ref 36.0–46.0)
Hemoglobin: 11.4 g/dL — ABNORMAL LOW (ref 12.0–15.0)
Hemoglobin: 11.9 g/dL — ABNORMAL LOW (ref 12.0–15.0)
MCH: 26.5 pg (ref 26.0–34.0)
MCH: 26.8 pg (ref 26.0–34.0)
MCHC: 31.8 g/dL (ref 30.0–36.0)
MCHC: 32.6 g/dL (ref 30.0–36.0)
MCV: 82.2 fL (ref 78.0–100.0)
MCV: 83.3 fL (ref 78.0–100.0)
Platelets: 258 10*3/uL (ref 150–400)
Platelets: 283 10*3/uL (ref 150–400)
RBC: 4.3 MIL/uL (ref 3.87–5.11)
RBC: 4.44 MIL/uL (ref 3.87–5.11)
RDW: 14.1 % (ref 11.5–15.5)
RDW: 14.3 % (ref 11.5–15.5)
WBC: 5.3 10*3/uL (ref 4.0–10.5)
WBC: 6.3 10*3/uL (ref 4.0–10.5)

## 2013-05-16 LAB — TROPONIN I
Troponin I: <0.3 ng/mL (ref <0.30)
Troponin I: <0.3 ng/mL (ref <0.30)

## 2013-05-16 LAB — BASIC METABOLIC PANEL
BUN: 13 mg/dL (ref 6–23)
CO2: 23 mEq/L (ref 19–32)
Calcium: 8.4 mg/dL (ref 8.4–10.5)
Chloride: 106 mEq/L (ref 96–112)
Creatinine, Ser: 0.75 mg/dL (ref 0.50–1.10)
GFR calc Af Amer: >90 mL/min (ref >90)
GFR calc non Af Amer: >90 mL/min (ref >90)
Glucose, Bld: 90 mg/dL (ref 70–99)
Potassium: 3.9 mEq/L (ref 3.5–5.1)
Sodium: 138 mEq/L (ref 135–145)

## 2013-05-16 MED ORDER — ALPRAZOLAM 0.25 MG PO TABS
1.0000 mg | ORAL_TABLET | Freq: Once | ORAL | Status: AC
  Administered 2013-05-16: 1 mg via ORAL

## 2013-05-16 MED ORDER — HEPARIN SODIUM (PORCINE) 5000 UNIT/ML IJ SOLN
5000.0000 [IU] | Freq: Three times a day (TID) | INTRAMUSCULAR | Status: DC
  Administered 2013-05-16: 5000 [IU] via SUBCUTANEOUS
  Filled 2013-05-16 (×4): qty 1

## 2013-05-16 MED ORDER — POTASSIUM CHLORIDE CRYS ER 10 MEQ PO TBCR
10.0000 meq | EXTENDED_RELEASE_TABLET | ORAL | Status: DC
  Administered 2013-05-16: 10 meq via ORAL
  Filled 2013-05-16: qty 1

## 2013-05-16 MED ORDER — RANOLAZINE ER 500 MG PO TB12
1000.0000 mg | ORAL_TABLET | Freq: Two times a day (BID) | ORAL | Status: DC
  Filled 2013-05-16 (×2): qty 2

## 2013-05-16 MED ORDER — FUROSEMIDE 20 MG PO TABS: 20.0000 mg | ORAL_TABLET | ORAL | Status: DC

## 2013-05-16 MED ORDER — RANOLAZINE ER 500 MG PO TB12
1000.0000 mg | ORAL_TABLET | Freq: Two times a day (BID) | ORAL | Status: DC
  Administered 2013-05-16: 1000 mg via ORAL
  Filled 2013-05-16 (×3): qty 2

## 2013-05-16 MED ORDER — MEXILETINE HCL 150 MG PO CAPS
300.0000 mg | ORAL_CAPSULE | Freq: Three times a day (TID) | ORAL | Status: DC
  Administered 2013-05-16: 300 mg via ORAL
  Filled 2013-05-16 (×5): qty 2

## 2013-05-16 MED ORDER — MEXILETINE HCL 150 MG PO CAPS
300.0000 mg | ORAL_CAPSULE | Freq: Three times a day (TID) | ORAL | Status: DC
  Filled 2013-05-16 (×3): qty 2

## 2013-05-16 NOTE — Progress Notes (Signed)
Utilization review completed.  

## 2013-05-16 NOTE — Progress Notes (Signed)
Notified by NT that patient's BP was low, SBP 80's. Manual recheck showed a pressure of 90/58. Patient remained alert, oriented and asymptomatic. Will continue to monitor. Blood pressure 90/58, pulse 78, temperature 97.9 F (36.6 C), temperature source Oral, resp. rate 18, height 5' (1.524 m), weight 88.7 kg (195 lb 8.8 oz), last menstrual period 04/16/2013, SpO2 100.00%. Troy Sine, RN

## 2013-05-16 NOTE — Progress Notes (Signed)
Attempted to get report x2 from ED nurse, RN currently busy and unable to get to phone. Left call back number on second attempt.  Troy Sine

## 2013-05-16 NOTE — Progress Notes (Signed)
Patient ID: [name], female   DOB: 30-Jun-1971, 42 y.o.   MRN: [id number]    Subjective:  No complaints Chronic lightheadedness  Objective:  Filed Vitals:   05/16/13 0521 05/16/13 0526 05/16/13 0814 05/16/13 0943  BP: 87/49 90/58 95/50  91/56  Pulse: 78  78 81  Temp: 97.9 F (36.6 C)     TempSrc: Oral     Resp: 18  16 16   Height:      Weight: 195 lb 8.8 oz (88.7 kg)     SpO2: 100%  100% 100%    Intake/Output from previous day:  Intake/Output Summary (Last 24 hours) at 05/16/13 0951 Last data filed at 05/16/13 0847  Gross per 24 hour  Intake    582 ml  Output    650 ml  Net    -68 ml    Physical Exam: Affect appropriate Healthy:  appears stated age HEENT: normal Neck supple with no adenopathy JVP normal no bruits no thyromegaly Lungs clear with no wheezing and good diaphragmatic motion Heart:  S1/S2 SEM worse with valsalva murmur, no rub, gallop or click PMI normal Abdomen: benighn, BS positve, no tenderness, no AAA no bruit.  No HSM or HJR Distal pulses intact with no bruits No edema Neuro non-focal Skin warm and dry No muscular weakness   Lab Results: Basic Metabolic Panel:  Recent Labs  40/98/11 1745 05/15/13 2349 05/16/13 0420  NA 137  --  138  K 3.9  --  3.9  CL 104  --  106  CO2 23  --  23  GLUCOSE 91  --  90  BUN 12  --  13  CREATININE 0.80 0.73 0.75  CALCIUM 8.2*  --  8.4   CBC:  Recent Labs  05/15/13 2349 05/16/13 0420  WBC 6.3 5.3  HGB 11.9* 11.4*  HCT 36.5 35.8*  MCV 82.2 83.3  PLT 283 258   Cardiac Enzymes:  Recent Labs  05/15/13 1745 05/15/13 2348 05/16/13 0420  TROPONINI <0.30 <0.30 <0.30    Imaging: Dg Chest Port 1 View  05/15/2013   *RADIOLOGY REPORT*  Clinical Data: Chest pain  PORTABLE CHEST - 1 VIEW  Comparison: 04/19/2013  Findings:   Cardiomegaly again noted.  Three leads cardiac pacemaker is unchanged in position.  Status post median sternotomy. No acute infiltrate or pulmonary edema.  IMPRESSION: No active  disease.  No significant change.   Original Report Authenticated By: Natasha Mead, M.D.    Cardiac Studies:  ECG:  A pacing LBBB   Telemetry:  NSR no arrhythmia 05/16/2013   Echo: 12/30/12  EF 40-45% S/P MV repair and no LVOT gradient  Medications:   . aspirin EC  81 mg Oral Daily  . fluticasone  1 puff Inhalation BID  . gabapentin  300 mg Oral QHS  . heparin  5,000 Units Subcutaneous Q8H  . levothyroxine  50 mcg Oral QAC breakfast  . loratadine  10 mg Oral Daily  . magnesium gluconate  250 mg Oral BID  . metoprolol succinate  12.5 mg Oral Daily  . mexiletine  300 mg Oral Q8H  . potassium chloride  10 mEq Oral Q48H  . ranolazine  1,000 mg Oral BID     . sodium chloride 75 mL/hr at 05/16/13 0526    Assessment/Plan:  Hypotension:  Resolved with fluid No signs of infection or bleeding stable Chest Pain.  R/O no evidence of ACS  Continue metoprolol and ranexa HOCM:  Device check last week was normal. June  had lots of shocks for VT  ? Failed ablation At [facility] She indicates looking at transplant list.    Some secondary gain issues. Her significant other is on probation She wants letter indicating He is needed to drive her to doctor appts  Will defer to Dr Drue Second [facility] 05/16/2013, 9:51 AM

## 2013-05-16 NOTE — Discharge Summary (Signed)
Discharge Summary   Patient ID: [name],  MRN: [id number], DOB/AGE: 02-22-1971 42 y.o.  Admit date: 05/15/2013 Discharge date: 05/16/2013  Primary Physician: Evelene Croon, MD Primary Cardiologist: Judie Petit. Excell Seltzer, MD Primary Electrophysiologist: G. Ladona Ridgel, MD   Discharge Diagnoses Principal Problem:   Hypovolemia Active Problems:   Hypertrophic cardiomyopathy/modest left ventricular dysfunction-EF 45-50%   Chronic combined systolic and diastolic CHF (congestive heart failure)   Automatic implantable cardioverter-defibrillator in situ   History of DVT (deep vein thrombosis)   Chronic anticoagulation   Hypothyroidism   History of mitral valve replacement   Atrial tachycardia   Allergies Allergies  Allergen Reactions  . Phenergan [Promethazine Hcl] Itching  . Amiodarone Nausea And Vomiting    N/V on IV amio 06/2012  . Warfarin And Related Other (See Comments)    Scars on skin    Diagnostic Studies/Procedures  PORTABLE CHEST X-RAY - 05/15/13  IMPRESSION:  No active disease. No significant change.  History of Present Illness [name] is a 42 y.o. female who was hospitalized overnight at [facility] on 05/15/13 with the above problem list.   She is a 42 y.o. female with PMHx s/f hypertrophic cardiomyopathy (s/p septal myomectomy and [employer] ICD 01/27/12), s/p (MVR at [facility]), recurrent VF with recurrent ICD discharges (s/p unsuccessful RFCA at [facility] 8-9 weeks ago), chronic systolic CHF (EF 40-98%), PAF, h/o DVT, asthma and hypothyroidism.  She reported experiencing presyncope, chest pain and palpitations. EMS was called and she was found to hypotensive (SBP 70s) after NTG administered. She presented to the ED. Device interrogation showed 3 instances of atrial tachycardia during which the patient was symptomatic. Initial trop-I returned WNL. CXR as above indicated no evidence of acute process. Labwork was otherwise unremarkable. The patient was given IVF  with improvement in BP and symptoms. The plan was made to observe the patient overnight.    Hospital Course   Two subsequent troponins returned WNL. Lasix and NTG were held. BB and mexilitine were continued. She was hydrated overnight. BP improved overnight and she remained asymptomatic. There were no episodes of atrial tachycardia. She was evaluated by Dr. Eden Emms this morning. Review of the patient's device check last week indicated several episodes of appropriate ICD discharges for VT in June (? Prior to RFCA at [facility]). He also noted that the patient had some secondary gain issues after requesting a ntoe for her significant other who is on probation to permit them to drive her to her doctor's appointments. He deemed the patient overall stable for discharge. She will be discharged on the medication regimen outlined below. She will keep a previously scheduled appointment with Dr. Excell Seltzer next month where her cardiomyopathy and arrhythmias can be further evaluated.   Discharge Vitals:  Blood pressure 91/56, pulse 81, temperature 97.9 F (36.6 C), temperature source Oral, resp. rate 16, height 5' (1.524 m), weight 88.7 kg (195 lb 8.8 oz), last menstrual period 04/16/2013, SpO2 100.00%.   Labs: Recent Labs     05/15/13  2349  05/16/13  0420  WBC  6.3  5.3  HGB  11.9*  11.4*  HCT  36.5  35.8*  MCV  82.2  83.3  PLT  283  258   Recent Labs Lab 05/15/13 1745 05/15/13 2349 05/16/13 0420  NA 137  --  138  K 3.9  --  3.9  CL 104  --  106  CO2 23  --  23  BUN 12  --  13  CREATININE 0.80 0.73 0.75  CALCIUM 8.2*  --  8.4  GLUCOSE 91  --  90   Recent Labs     05/15/13  1745  05/15/13  2348  05/16/13  0420  TROPONINI  <0.30  <0.30  <0.30   Disposition:  Discharge Orders   Future Appointments Provider Department Dept Phone   06/04/2013 10:30 AM Tonny Bollman, MD [facility] Main Office [place]) [phone]   08/03/2013 9:00 AM Madelaine Bhat Gus Rankin, DO [place] [facility] [place]  [phone]   08/04/2013 9:30 AM Marinus Maw, MD [facility] Main Office [place]) [phone]   Future Orders Complete By Expires   Diet - low sodium heart healthy  As directed    Increase activity slowly  As directed          Follow-up Information   Follow up with Tonny Bollman, MD On 06/04/2013. (At 10:30 AM as previously scheduled.)    Specialty:  Cardiology   Contact information:   [address] [place] [address] [phone]       Follow up with Evelene Croon, MD. Schedule an appointment as soon as possible for a visit in 1 week. (For general post-hospital follow-up.)    Specialty:  Family Medicine   Contact information:   [address] [place] [address] [phone]      ischarge Medications:    Medication List         acetaminophen 500 MG tablet  Commonly known as:  TYLENOL  Take 500 mg by mouth 3 (three) times daily as needed for pain.     ALPRAZolam 1 MG tablet  Commonly known as:  XANAX  Take 1 mg by mouth 3 (three) times daily as needed for anxiety.     aspirin EC 81 MG tablet  Take 81 mg by mouth daily.     beclomethasone 80 MCG/ACT inhaler  Commonly known as:  QVAR  Inhale 1 puff into the lungs 2 (two) times daily.     cetirizine 10 MG tablet  Commonly known as:  ZYRTEC  Take 10 mg by mouth every evening.     furosemide 20 MG tablet  Commonly known as:  LASIX  Take 1 tablet (20 mg total) by mouth every other day.     gabapentin 300 MG capsule  Commonly known as:  NEURONTIN  Take 1 capsule (300 mg total) by mouth at bedtime.     GAS-X PO  Take 1 tablet by mouth 2 (two) times daily as needed (pressure).     levothyroxine 50 MCG tablet  Commonly known as:  SYNTHROID, LEVOTHROID  Take 50 mcg by mouth daily before breakfast.     magnesium gluconate 500 MG tablet  Commonly known as:  MAGONATE  Take 250 mg by mouth 2 (two) times daily.     metoprolol succinate 25 MG 24 hr tablet  Commonly known as:   TOPROL-XL  Take 12.5 mg by mouth daily.     mexiletine 150 MG capsule  Commonly known as:  MEXITIL  Take 300 mg by mouth 3 (three) times daily.     oxyCODONE-acetaminophen 5-325 MG per tablet  Commonly known as:  PERCOCET/ROXICET  Take 1 tablet by mouth 3 (three) times daily as needed for pain.     Potassium Gluconate 595 MG Caps  Take 595 mg by mouth daily.     ranolazine 1000 MG SR tablet  Commonly known as:  RANEXA  Take 1,000 mg by mouth 2 (two) times daily.     traMADol  50 MG tablet  Commonly known as:  ULTRAM  Take 50 mg by mouth 3 (three) times daily as needed for pain.       Outstanding Labs/Studies: None  Duration of Discharge Encounter: Greater than 30 minutes including physician time.  Signed, R. Hurman Horn, PA-C 05/16/2013, 12:53 PM

## 2013-05-16 NOTE — Progress Notes (Addendum)
Patient arrived to unit from ED via stretcher. Patient alert, oriented and ambulatory.  Admission weight, vitals and assessment completed. Fall and safety plan reviewed with patient. Patient currently resting comfortably with call light within reach. Will continue to monitor. Blood pressure 109/80, pulse 80, temperature 97.9 F (36.6 C), temperature source Oral, resp. rate 16, height 5' (1.524 m), weight 88.724 kg (195 lb 9.6 oz), last menstrual period 04/16/2013, SpO2 99.00%. Troy Sine

## 2013-05-16 NOTE — Progress Notes (Signed)
1430 Ambulated to hallway back and forth without signs of distress.No complaint presented

## 2013-05-16 NOTE — Progress Notes (Signed)
1520 discharge intructions given to pt . Verbalized understanding.Wheeled to lobby by Nt

## 2013-05-17 ENCOUNTER — Encounter (HOSPITAL_COMMUNITY): Payer: BC Managed Care – PPO

## 2013-05-19 ENCOUNTER — Telehealth: Payer: Self-pay | Admitting: Cardiovascular Disease

## 2013-05-19 ENCOUNTER — Encounter (HOSPITAL_COMMUNITY): Payer: BC Managed Care – PPO

## 2013-05-19 NOTE — Telephone Encounter (Signed)
Pt called because this past Saturday she had an episode of fast heart rate, EMS was called pt was taken to the ER. Pt states in the EMS monitor she was in SVT rate 170 beats/minute this lasted about 10 to 12 minutes when she arrived to the ER her heart rate was back to normal , but her BP was fluctuating up and down, 87/49 to 95/50. Pt states this past Monday morning she was just sitting when her heart beat got raped , again. And Last night about 10:00 PM , her heart rate went  up to 141 beats/ minute, it lasted  about 5 minutes. Pt's heart rate is fine now. She would like to know if she needs to be see soon.

## 2013-05-19 NOTE — Telephone Encounter (Signed)
New problem   Pt need to speak to nurse concerning her heart rate last night of 141 that last 5 mins. Heart rate is fine now. Please call pt.

## 2013-05-19 NOTE — Telephone Encounter (Signed)
Dr. Ladona Ridgel aware of pt's signs and symptoms. MD recommends for pt  to take her medications as scheduled.  Pt  to make an appointment to be seen in his Clinic in 2 to 3 weeks. An appointment was made for September 18 th with Dr. Ladona Ridgel at 2:45 PM, pt aware.

## 2013-05-21 ENCOUNTER — Encounter (HOSPITAL_COMMUNITY): Payer: BC Managed Care – PPO

## 2013-05-24 ENCOUNTER — Encounter (HOSPITAL_COMMUNITY): Payer: BC Managed Care – PPO

## 2013-05-26 ENCOUNTER — Encounter (HOSPITAL_COMMUNITY): Payer: BC Managed Care – PPO

## 2013-05-28 ENCOUNTER — Encounter (HOSPITAL_COMMUNITY): Payer: BC Managed Care – PPO

## 2013-06-02 ENCOUNTER — Encounter (HOSPITAL_COMMUNITY): Payer: BC Managed Care – PPO

## 2013-06-04 ENCOUNTER — Ambulatory Visit (INDEPENDENT_AMBULATORY_CARE_PROVIDER_SITE_OTHER): Payer: BC Managed Care – PPO | Admitting: Cardiovascular Disease

## 2013-06-04 ENCOUNTER — Encounter: Payer: Self-pay | Admitting: Cardiovascular Disease

## 2013-06-04 ENCOUNTER — Encounter (HOSPITAL_COMMUNITY): Payer: BC Managed Care – PPO

## 2013-06-04 VITALS — BP 104/60 | HR 78 | Ht 60.0 in | Wt 194.0 lb

## 2013-06-04 DIAGNOSIS — I421 Obstructive hypertrophic cardiomyopathy: Secondary | ICD-10-CM

## 2013-06-04 NOTE — Progress Notes (Signed)
HPI:  42 year-old woman with hypertrophic cardiomyopathy presenting for follow-up evaluation. She has an extremely complicated cardiac history. Has had recurrent/refractory VT/VF despite attempted catheter ablation at [facility] this summer. She was hospitalized June 1st-July 11th at [facility]. Hospitalization was complicated by CDif. She underwent plasma exchange because of allosensitization as part of transplant evaluation/preparation.   She feels poorly. Gait is unsteady. She is walking with a walker but has noticed a little improvement in dizziness. Thinks much of this is due to medication side-effect. No chest pain. Chronic dyspnea is unchanged. No syncope since last evaluation. Complains of leg swelling unless she takes lasix.  Outpatient Encounter Prescriptions as of 06/04/2013  Medication Sig Dispense Refill  . acetaminophen (TYLENOL) 500 MG tablet Take 500 mg by mouth 3 (three) times daily as needed for pain.      Marland Kitchen ALPRAZolam (XANAX) 1 MG tablet Take 1 mg by mouth 3 (three) times daily as needed for anxiety.       Marland Kitchen aspirin EC 81 MG tablet Take 81 mg by mouth daily.      . beclomethasone (QVAR) 80 MCG/ACT inhaler Inhale 1 puff into the lungs 2 (two) times daily.      . cetirizine (ZYRTEC) 10 MG tablet Take 10 mg by mouth every evening.       . furosemide (LASIX) 20 MG tablet Take 1 tablet (20 mg total) by mouth every other day.  30 tablet    . gabapentin (NEURONTIN) 300 MG capsule Take 1 capsule (300 mg total) by mouth at bedtime.  30 capsule  2  . levothyroxine (SYNTHROID, LEVOTHROID) 50 MCG tablet Take 50 mcg by mouth daily before breakfast.       . magnesium gluconate (MAGONATE) 500 MG tablet Take 250 mg by mouth 2 (two) times daily.      . metoprolol succinate (TOPROL-XL) 25 MG 24 hr tablet Take 12.5 mg by mouth daily.       Marland Kitchen mexiletine (MEXITIL) 150 MG capsule Take 300 mg by mouth 3 (three) times daily.       Marland Kitchen oxyCODONE-acetaminophen (PERCOCET/ROXICET) 5-325 MG per tablet Take 1  tablet by mouth 3 (three) times daily as needed for pain.      Marland Kitchen Potassium Gluconate 595 MG CAPS Take 595 mg by mouth daily.      . ranolazine (RANEXA) 1000 MG SR tablet Take 1,000 mg by mouth 2 (two) times daily.       . Simethicone (GAS-X PO) Take 1 tablet by mouth 2 (two) times daily as needed (pressure).      . traMADol (ULTRAM) 50 MG tablet Take 50 mg by mouth 3 (three) times daily as needed for pain.       No facility-administered encounter medications on file as of 06/04/2013.    Allergies  Allergen Reactions  . Phenergan [Promethazine Hcl] Itching  . Amiodarone Nausea And Vomiting    N/V on IV amio 06/2012  . Warfarin And Related Other (See Comments)    Scars on skin    Past Medical History  Diagnosis Date  . Hypertrophic cardiomyopathy     s/p septal myomectomy with implantable defibrillator 01/27/2012   . Anxiety state, unspecified   . Allergic rhinitis     to dogs  . Asthma   . History of anemia     during pregnancy  . History of chicken pox   . Generalized headaches   . Ocular migraine     no HA  . Back pain  told cracked bone, vicodin didn't help, improved with percocets/muscle relaxants, no eval by prior PCP  . 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 DVT (deep venous thrombosis)     Diagnosed 02/13/12  - was placed on Warfarin after cardiac surgery but had skin reaction to warfarin so was changed to Pradaxa so DVT occurred while on Pradaxa although unclear if it occurred during transition  . Hypotension   . Atrial fibrillation     a. On pradaxa.  coumadin necrosis with warfarin;  b. Amio d/c'd 02/2012  . ICD (implantable cardiac defibrillator) in place     02/04/2012, [employer]  . Pericardial effusion   . Chronic combined systolic and diastolic CHF (congestive heart failure)     a. echo 03/19/12: severe asymmetric septal hypertrophy, evidence of upper septal myomectomy, peak LV outflow tract gradient  35, EF 35%,;  b.  Echo 9/13: Severe LVH, EF 30-35%, anteroseptal and inferior HK, LVOT narrow, mild AI, mild to moderate mitral stenosis, severe LAE.  Marland Kitchen Pericardial tamponade 03/14/2012  . 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 01/2012 s/p BSX ICD;  b. 06/2012 Multaq initiated due to recurrent syncope, VT/VF - had severe n/v with IV amio.  . Skin necrosis --COUMADIN   . Chronic chest wall pain     eval by CVTS, stable, rec return to [facility] if continued pain  . Syncope     a. in setting of VT/VF.  Marland Kitchen Hypothyroid     ROS: Negative except as per HPI  BP 104/60  Pulse 78  Ht 5' (1.524 m)  Wt 194 lb (87.998 kg)  BMI 37.89 kg/m2  SpO2 96%  PHYSICAL EXAM: Pt is alert and oriented, NAD HEENT: normal Neck: JVP - normal Lungs: CTA bilaterally CV: RRR with grade 3/6 systolic murmur at the LSB Abd: soft, NT, Positive BS, no hepatomegaly Ext: trace edema, distal pulses intact and equal Skin: warm/dry no rash  2D Echo 12/30/2012: Left ventricle: Septal hypokinesis S/P myectomy with thinned area near basal septum The cavity size was mildly dilated. Wall thickness was increased in a pattern of moderate LVH. Systolic function was mildly to moderately reduced. The estimated ejection fraction was in the range of 40% to 45%. Diffuse hypokinesis.  ------------------------------------------------------------ Aortic valve: Structurally normal valve. Cusp separation was normal. Doppler: Transvalvular velocity was within the normal range. There was no stenosis. No regurgitation. VTI ratio of LVOT to aortic valve: 0.92. Valve area: 2.6cm^2(VTI). Indexed valve area: 1.4cm^2/m^2 (VTI). Peak velocity ratio of LVOT to aortic valve: 0.81. Valve area: 2.3cm^2 (Vmax). Indexed valve area: 1.24cm^2/m^2 (Vmax). Mean gradient: 8mm Hg (S). Peak gradient: 16mm Hg (S).  ------------------------------------------------------------ Mitral valve: S/P repair. Posterior ring prominant. trivial residual MR with  mild diastolic gradient. Chrodal SAM persists but no significant LVOT gradient Doppler: Valve area by continuity equation (using LVOT flow): 1.8cm^2. Indexed valve area by continuity equation (using LVOT flow): 0.97cm^2/m^2. Mean gradient: 7mm Hg (D).  ------------------------------------------------------------ Left atrium: The atrium was moderately to severely dilated.  ------------------------------------------------------------ Atrial septum: No defect or patent foramen ovale was identified.  ------------------------------------------------------------ Right ventricle: Pacer wire or catheter noted in right ventricle.  ------------------------------------------------------------ Tricuspid valve: Doppler: Mild regurgitation.  ------------------------------------------------------------ Right atrium: The atrium was mildly dilated.  ------------------------------------------------------------ Pericardium: The pericardium was normal in appearance.  ASSESSMENT AND PLAN: 1. Hypertrophic cardiomyopathy 2. Ventricular tachycardia/Ventricular fib s/p ICD with recurrent shocks 3. NYHA Class 3 CHF  Currently maintained on  mexilietine/ranexa for her arrhythmia. Followed by Dr Ladona Ridgel - sees him back in 2 weeks. No evidence of volume overload on exam. I did not recommend any changes today. She is under a great deal of stress related to her poor health with consideration of cardiac transplantation. We discussed this at length today. She is still in process of trying to obtain disability and I will fill out paperwork as needed. She is clearly not capable of working. I'll see her back in 6 months as she has close EP follow-up and will see the transplant team at [facility] later this month as well.  Tonny Bollman 06/04/2013 11:17 AM

## 2013-06-04 NOTE — Patient Instructions (Signed)
Your physician recommends that you continue on your current medications as directed. Please refer to the Current Medication list given to you today.  Your physician wants you to follow-up in: 6 MONTHS with Dr Cooper.  You will receive a reminder letter in the mail two months in advance. If you don't receive a letter, please call our office to schedule the follow-up appointment.  

## 2013-06-07 ENCOUNTER — Encounter (HOSPITAL_COMMUNITY): Payer: BC Managed Care – PPO

## 2013-06-09 ENCOUNTER — Encounter (HOSPITAL_COMMUNITY): Payer: BC Managed Care – PPO

## 2013-06-11 ENCOUNTER — Encounter (HOSPITAL_COMMUNITY): Payer: BC Managed Care – PPO

## 2013-06-14 ENCOUNTER — Encounter (HOSPITAL_COMMUNITY): Payer: BC Managed Care – PPO

## 2013-06-16 ENCOUNTER — Encounter (HOSPITAL_COMMUNITY): Payer: BC Managed Care – PPO

## 2013-06-17 ENCOUNTER — Ambulatory Visit (INDEPENDENT_AMBULATORY_CARE_PROVIDER_SITE_OTHER): Payer: BC Managed Care – PPO | Admitting: Internal Medicine

## 2013-06-17 ENCOUNTER — Encounter: Payer: Self-pay | Admitting: Internal Medicine

## 2013-06-17 VITALS — BP 107/68 | HR 75 | Ht 61.0 in | Wt 197.0 lb

## 2013-06-17 DIAGNOSIS — I509 Heart failure, unspecified: Secondary | ICD-10-CM

## 2013-06-17 DIAGNOSIS — I5042 Chronic combined systolic (congestive) and diastolic (congestive) heart failure: Secondary | ICD-10-CM

## 2013-06-17 DIAGNOSIS — I472 Ventricular tachycardia: Secondary | ICD-10-CM

## 2013-06-17 DIAGNOSIS — Z95810 Presence of automatic (implantable) cardiac defibrillator: Secondary | ICD-10-CM

## 2013-06-17 LAB — ICD DEVICE OBSERVATION: DEV-0020ICD: NEGATIVE

## 2013-06-17 NOTE — Assessment & Plan Note (Signed)
She has combined chronic systolic and diastolic heart failure. Previously, she was intolerant of ACE inhibitors or ARB drugs secondary to hypotension. She will continue her beta blocker and she is instructed to maintain a low-sodium diet.

## 2013-06-17 NOTE — Progress Notes (Signed)
HPI [name] returns today for followup. She is a very pleasant 42 year old woman with a history of hypertrophic cardiomyopathy status post myomectomy, with recurrent ventricular tachycardia and fibrillation, status post ICD implantation. Because of recurrent ventricular tachycardia, she was transferred to [facility] several months ago where she underwent unsuccessful ablation of ventricular tachycardia. She was then treated with multiple antiarrhythmic drugs, ultimately being discharged on a combination of mexiletine, and Ranexa. In the interim, she has experienced palpitations, but no ICD shocks. Interrogation of her device demonstrates a 1-1 tachycardia which is most consistent with supraventricular tachycardia. Interrogation of her strips demonstrates initiation with a PAC with PR prolongation. Allergies  Allergen Reactions  . Phenergan [Promethazine Hcl] Itching  . Amiodarone Nausea And Vomiting    N/V on IV amio 06/2012  . Warfarin And Related Other (See Comments)    Scars on skin     Current Outpatient Prescriptions  Medication Sig Dispense Refill  . acetaminophen (TYLENOL) 500 MG tablet Take 500 mg by mouth 3 (three) times daily as needed for pain.      Marland Kitchen ALPRAZolam (XANAX) 1 MG tablet Take 1 mg by mouth 3 (three) times daily as needed for anxiety.       Marland Kitchen aspirin EC 81 MG tablet Take 81 mg by mouth daily.      . beclomethasone (QVAR) 80 MCG/ACT inhaler Inhale 1 puff into the lungs 2 (two) times daily.      . cetirizine (ZYRTEC) 10 MG tablet Take 10 mg by mouth every evening.       . furosemide (LASIX) 20 MG tablet Take 1 tablet (20 mg total) by mouth every other day.  30 tablet    . gabapentin (NEURONTIN) 300 MG capsule Take 1 capsule (300 mg total) by mouth at bedtime.  30 capsule  2  . levothyroxine (SYNTHROID, LEVOTHROID) 50 MCG tablet Take 50 mcg by mouth daily before breakfast.       . magnesium gluconate (MAGONATE) 500 MG tablet Take 250 mg by mouth 2 (two)  times daily.      . metoprolol succinate (TOPROL-XL) 25 MG 24 hr tablet Take 12.5 mg by mouth daily.       Marland Kitchen mexiletine (MEXITIL) 150 MG capsule Take 300 mg by mouth 3 (three) times daily.       Marland Kitchen oxyCODONE-acetaminophen (PERCOCET/ROXICET) 5-325 MG per tablet Take 1 tablet by mouth 3 (three) times daily as needed for pain.      Marland Kitchen Potassium Gluconate 595 MG CAPS Take 595 mg by mouth daily.      . ranolazine (RANEXA) 1000 MG SR tablet Take 1,000 mg by mouth 2 (two) times daily.       . Simethicone (GAS-X PO) Take 1 tablet by mouth 2 (two) times daily as needed (pressure).      . traMADol (ULTRAM) 50 MG tablet Take 50 mg by mouth 3 (three) times daily as needed for pain.       No current facility-administered medications for this visit.     Past Medical History  Diagnosis Date  . Hypertrophic cardiomyopathy     s/p septal myomectomy with implantable defibrillator 01/27/2012   . Anxiety state, unspecified   . Allergic rhinitis     to dogs  . Asthma   . History of anemia     during pregnancy  . History of chicken pox   . Generalized headaches   . Ocular migraine     no HA  . Back pain  told cracked bone, vicodin didn't help, improved with percocets/muscle relaxants, no eval by prior PCP  . 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 DVT (deep venous thrombosis)     Diagnosed 02/13/12  - was placed on Warfarin after cardiac surgery but had skin reaction to warfarin so was changed to Pradaxa so DVT occurred while on Pradaxa although unclear if it occurred during transition  . Hypotension   . Atrial fibrillation     a. On pradaxa.  coumadin necrosis with warfarin;  b. Amio d/c'd 02/2012  . ICD (implantable cardiac defibrillator) in place     02/04/2012, [employer]  . Pericardial effusion   . Chronic combined systolic and diastolic CHF (congestive heart failure)     a. echo 03/19/12: severe asymmetric septal hypertrophy, evidence of  upper septal myomectomy, peak LV outflow tract gradient 35, EF 35%,;  b.  Echo 9/13: Severe LVH, EF 30-35%, anteroseptal and inferior HK, LVOT narrow, mild AI, mild to moderate mitral stenosis, severe LAE.  Marland Kitchen Pericardial tamponade 03/14/2012  . 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 01/2012 s/p BSX ICD;  b. 06/2012 Multaq initiated due to recurrent syncope, VT/VF - had severe n/v with IV amio.  . Skin necrosis --COUMADIN   . Chronic chest wall pain     eval by CVTS, stable, rec return to [facility] if continued pain  . Syncope     a. in setting of VT/VF.  Marland Kitchen Hypothyroid     ROS:   All systems reviewed and negative except as noted in the HPI.   Past Surgical History  Procedure Laterality Date  . Cesarean section  1991  . Induced abortion  1990 and 1993  . Cardiac surgery  01/27/2012    median sternotomy, septal myectomy, MVR - [facility] (Dr. Silvestre Mesi)  . Cardiac defibrillator placement  01/2012  . Myomectomy    . Pericardial window  03/14/2012    Procedure: PERICARDIAL WINDOW;  Surgeon: Purcell Nails, MD;  Location: [facility] OR;  Service: Open Heart Surgery;  Laterality: N/A;  Subxyphoid Pericardial  Window      Family History  Problem Relation Age of Onset  . Diabetes Father   . Heart disease Father     unspecified heart problem  . Thyroid disease Mother   . Anemia Mother     mediterranean anemia, ITP  . Heart disease Maternal Grandfather     CHF  . Diabetes Paternal Grandfather   . Cancer Paternal Grandfather   . Coronary artery disease Neg Hx   . Stroke Neg Hx   . Sudden death Neg Hx      History   Social History  . Marital Status: Single    Spouse Name: N/A    Number of Children: 1  . Years of Education: N/A   Occupational History  . Naval architect    Social History Main Topics  . Smoking status: Former Smoker -- 1.00 packs/day for 3 years    Types: Cigarettes    Quit date: 09/30/1988  . Smokeless tobacco: Never Used     Comment: quitin 1990  . Alcohol Use: No      Comment: none since "months" as of 02/28/2013  . Drug Use: No     Comment: quiti n 1989  . Sexual Activity: Yes    Birth Control/ Protection: Pill   Other Topics Concern  . Not on file   Social History Narrative  Caffeine: 1 cup coffee/day   Divorced, 1 child.  lives with boyfriend, dogs   Occupation: Full time Tax adviser   Activity: walks 60min/day   Diet: fruits/vegetables daily, water, no fish     BP 107/68  Pulse 75  Ht 5\' 1"  (1.549 m)  Wt 197 lb (89.359 kg)  BMI 37.24 kg/m2  Physical Exam:  Chronically ill appearing 42 year old woman, NAD HEENT: Unremarkable Neck:  No JVD, no thyromegally Back:  No CVA tenderness Lungs:  Clear wwith no wheezes, rales, or rhonchi HEART:  Regular rate rhythm, no murmurs, no rubs, no clicks Abd:  soft, positive bowel sounds, no organomegally, no rebound, no guarding Ext:  2 plus pulses, no edema, no cyanosis, no clubbing Skin:  No rashes no nodules Neuro:  CN II through XII intact, motor grossly intact  DEVICE  Normal device function.  See PaceArt for details.   Assess/Plan:

## 2013-06-17 NOTE — Assessment & Plan Note (Signed)
Her [employer] device is working normally. We'll plan to recheck in several months. 

## 2013-06-17 NOTE — Patient Instructions (Addendum)
Your physician recommends that you continue on your current medications as directed. Please refer to the Current Medication list given to you today.  Your physician recommends that you schedule a follow-up appointment in: 3 months with Dr.[place]  

## 2013-06-17 NOTE — Assessment & Plan Note (Signed)
Her ventricular arrhythmias appear stable at the present time. She will continue her current antiarrhythmic drug therapy.

## 2013-06-18 ENCOUNTER — Encounter (HOSPITAL_COMMUNITY): Payer: BC Managed Care – PPO

## 2013-06-21 ENCOUNTER — Encounter (HOSPITAL_COMMUNITY): Payer: BC Managed Care – PPO

## 2013-06-21 ENCOUNTER — Encounter: Payer: Self-pay | Admitting: Internal Medicine

## 2013-06-23 ENCOUNTER — Encounter (HOSPITAL_COMMUNITY): Payer: BC Managed Care – PPO

## 2013-06-25 ENCOUNTER — Encounter (HOSPITAL_COMMUNITY): Payer: BC Managed Care – PPO

## 2013-06-28 ENCOUNTER — Encounter (HOSPITAL_COMMUNITY): Payer: BC Managed Care – PPO

## 2013-06-30 ENCOUNTER — Encounter (HOSPITAL_COMMUNITY): Payer: BC Managed Care – PPO

## 2013-07-02 ENCOUNTER — Encounter (HOSPITAL_COMMUNITY): Payer: BC Managed Care – PPO

## 2013-07-05 ENCOUNTER — Encounter (HOSPITAL_COMMUNITY): Payer: BC Managed Care – PPO

## 2013-07-07 ENCOUNTER — Encounter (HOSPITAL_COMMUNITY): Payer: BC Managed Care – PPO

## 2013-07-09 ENCOUNTER — Encounter (HOSPITAL_COMMUNITY): Payer: BC Managed Care – PPO

## 2013-07-12 ENCOUNTER — Encounter (HOSPITAL_COMMUNITY): Payer: BC Managed Care – PPO

## 2013-07-14 ENCOUNTER — Encounter (HOSPITAL_COMMUNITY): Payer: BC Managed Care – PPO

## 2013-07-16 ENCOUNTER — Encounter (HOSPITAL_COMMUNITY): Payer: BC Managed Care – PPO

## 2013-07-19 ENCOUNTER — Encounter (HOSPITAL_COMMUNITY): Payer: BC Managed Care – PPO

## 2013-07-21 ENCOUNTER — Encounter (HOSPITAL_COMMUNITY): Payer: BC Managed Care – PPO

## 2013-07-23 ENCOUNTER — Encounter (HOSPITAL_COMMUNITY): Payer: BC Managed Care – PPO

## 2013-07-26 ENCOUNTER — Encounter (HOSPITAL_COMMUNITY): Payer: BC Managed Care – PPO

## 2013-07-28 ENCOUNTER — Encounter (HOSPITAL_COMMUNITY): Payer: BC Managed Care – PPO

## 2013-07-30 ENCOUNTER — Encounter (HOSPITAL_COMMUNITY): Payer: BC Managed Care – PPO

## 2013-08-03 ENCOUNTER — Telehealth: Payer: Self-pay

## 2013-08-03 ENCOUNTER — Encounter: Payer: Self-pay | Admitting: Neurology

## 2013-08-03 ENCOUNTER — Ambulatory Visit (INDEPENDENT_AMBULATORY_CARE_PROVIDER_SITE_OTHER): Payer: BC Managed Care – PPO | Admitting: Neurology

## 2013-08-03 VITALS — BP 100/60 | HR 78 | Temp 98.2°F | Ht 60.0 in | Wt 203.0 lb

## 2013-08-03 DIAGNOSIS — R200 Anesthesia of skin: Secondary | ICD-10-CM

## 2013-08-03 DIAGNOSIS — R209 Unspecified disturbances of skin sensation: Secondary | ICD-10-CM

## 2013-08-03 MED ORDER — GABAPENTIN 300 MG PO CAPS: 300.0000 mg | ORAL_CAPSULE | Freq: Three times a day (TID) | ORAL | Status: DC

## 2013-08-03 NOTE — Progress Notes (Signed)
NEUROLOGY FOLLOW UP OFFICE NOTE  [name] [id number]  HISTORY OF PRESENT ILLNESS: [name] is a 42 year old woman with HOCM s/p AICD who follows up for episode of numbness and tingling.  Records and images were personally reviewed where available.   She presented to the ED on 04/19/13 after developing numbness of the head, arms and hands.  She has associated chronic headache, described as a humming in her head.  She also notes lightheadedness, which she attributes to medication changes.  She denies visual disturbance. She also complained of chest pain as well.  She denied visual disturbance or focal weakness.  She had headache, which is chronic for several months.  She also reported lightheadedness, which she attributes to having started a new heart medication.  ECG revealed a paced rhythm of 76 bpm with LBBB and non-specific T-wave changes.  CT of the brain was unremarkable.  TSH from 01/19/13 was 3.840.  Hgb A1c from 06/14/12 was 5.5.  Lyme was positive but confirmatory test was negative.  B12 level was 221 but methylmalonic acid level was normal.  For the paresthesias, we started gabapentin 300mg  qhs.  She still notes pins and needles sensation, intermittently, involving both feet/toes and hands/fingers, as well as left side of face.  No associated pain.  She notes some lightheadedness but no syncope.  She denies dry eyes or dry mouth.  She denies sudomotor dysfunction.  She does have sometimes constipation and diarrhea.  Gabapentin has been ineffective.  PAST MEDICAL HISTORY: Past Medical History  Diagnosis Date  . Hypertrophic cardiomyopathy     s/p septal myomectomy with implantable defibrillator 01/27/2012   . Anxiety state, unspecified   . Allergic rhinitis     to dogs  . Asthma   . History of anemia     during pregnancy  . History of chicken pox   . Generalized headaches   . Ocular migraine     no HA  . Back pain     told cracked bone, vicodin didn't help, improved with  percocets/muscle relaxants, no eval by prior PCP  . 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 DVT (deep venous thrombosis)     Diagnosed 02/13/12  - was placed on Warfarin after cardiac surgery but had skin reaction to warfarin so was changed to Pradaxa so DVT occurred while on Pradaxa although unclear if it occurred during transition  . Hypotension   . Atrial fibrillation     a. On pradaxa.  coumadin necrosis with warfarin;  b. Amio d/c'd 02/2012  . ICD (implantable cardiac defibrillator) in place     02/04/2012, [employer]  . Pericardial effusion   . Chronic combined systolic and diastolic CHF (congestive heart failure)     a. echo 03/19/12: severe asymmetric septal hypertrophy, evidence of upper septal myomectomy, peak LV outflow tract gradient 35, EF 35%,;  b.  Echo 9/13: Severe LVH, EF 30-35%, anteroseptal and inferior HK, LVOT narrow, mild AI, mild to moderate mitral stenosis, severe LAE.  Marland Kitchen Pericardial tamponade 03/14/2012  . 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 01/2012 s/p BSX ICD;  b. 06/2012 Multaq initiated due to recurrent syncope, VT/VF - had severe n/v with IV amio.  . Skin necrosis --COUMADIN   . Chronic chest wall pain     eval by CVTS, stable, rec return to [facility] if continued pain  . Syncope     a. in setting  of VT/VF.  Marland Kitchen Hypothyroid     MEDICATIONS: Current Outpatient Prescriptions on File Prior to Visit  Medication Sig Dispense Refill  . acetaminophen (TYLENOL) 500 MG tablet Take 500 mg by mouth 3 (three) times daily as needed for pain.      Marland Kitchen ALPRAZolam (XANAX) 1 MG tablet Take 1 mg by mouth 3 (three) times daily as needed for anxiety.       Marland Kitchen aspirin EC 81 MG tablet Take 81 mg by mouth daily.      . beclomethasone (QVAR) 80 MCG/ACT inhaler Inhale 1 puff into the lungs 2 (two) times daily.      . cetirizine (ZYRTEC) 10 MG tablet Take 10 mg by mouth every evening.       . furosemide (LASIX) 20 MG tablet Take 1  tablet (20 mg total) by mouth every other day.  30 tablet    . gabapentin (NEURONTIN) 300 MG capsule Take 1 capsule (300 mg total) by mouth at bedtime.  30 capsule  2  . levothyroxine (SYNTHROID, LEVOTHROID) 50 MCG tablet Take 50 mcg by mouth daily before breakfast.       . magnesium gluconate (MAGONATE) 500 MG tablet Take 250 mg by mouth 2 (two) times daily.      . metoprolol succinate (TOPROL-XL) 25 MG 24 hr tablet Take 12.5 mg by mouth daily.       Marland Kitchen mexiletine (MEXITIL) 150 MG capsule Take 300 mg by mouth 3 (three) times daily.       Marland Kitchen oxyCODONE-acetaminophen (PERCOCET/ROXICET) 5-325 MG per tablet Take 1 tablet by mouth 3 (three) times daily as needed for pain.      Marland Kitchen Potassium Gluconate 595 MG CAPS Take 595 mg by mouth daily.      . ranolazine (RANEXA) 1000 MG SR tablet Take 1,000 mg by mouth 2 (two) times daily.       . Simethicone (GAS-X PO) Take 1 tablet by mouth 2 (two) times daily as needed (pressure).      . traMADol (ULTRAM) 50 MG tablet Take 50 mg by mouth 3 (three) times daily as needed for pain.       No current facility-administered medications on file prior to visit.    ALLERGIES: Allergies  Allergen Reactions  . Phenergan [Promethazine Hcl] Itching  . Amiodarone Nausea And Vomiting    N/V on IV amio 06/2012  . Warfarin And Related Other (See Comments)    Scars on skin    FAMILY HISTORY: Family History  Problem Relation Age of Onset  . Diabetes Father   . Heart disease Father     unspecified heart problem  . Thyroid disease Mother   . Anemia Mother     mediterranean anemia, ITP  . Heart disease Maternal Grandfather     CHF  . Diabetes Paternal Grandfather   . Cancer Paternal Grandfather   . Coronary artery disease Neg Hx   . Stroke Neg Hx   . Sudden death Neg Hx     SOCIAL HISTORY: History   Social History  . Marital Status: Single    Spouse Name: N/A    Number of Children: 1  . Years of Education: N/A   Occupational History  . Naval architect     Social History Main Topics  . Smoking status: Former Smoker -- 1.00 packs/day for 3 years    Types: Cigarettes    Quit date: 09/30/1988  . Smokeless tobacco: Never Used     Comment: quitin 1990  .  Alcohol Use: No     Comment: none since "months" as of 02/28/2013  . Drug Use: No     Comment: quiti n 1989  . Sexual Activity: Yes    Birth Control/ Protection: Pill   Other Topics Concern  . Not on file   Social History Narrative   Caffeine: 1 cup coffee/day   Divorced, 1 child.  lives with boyfriend, dogs   Occupation: Full time Tax adviser   Activity: walks 42min/day   Diet: fruits/vegetables daily, water, no fish    REVIEW OF SYSTEMS: Constitutional: No fevers, chills, or sweats, no generalized fatigue, change in appetite Eyes: No visual changes, double vision, eye pain Ear, nose and throat: No hearing loss, ear pain, nasal congestion, sore throat Cardiovascular: No chest pain, palpitations Respiratory:  No shortness of breath at rest or with exertion, wheezes GastrointestinaI: No nausea, vomiting, diarrhea, abdominal pain, fecal incontinence Genitourinary:  No dysuria, urinary retention or frequency Musculoskeletal:  No neck pain, back pain Integumentary: No rash, pruritus, skin lesions Neurological: as above Psychiatric: No depression, insomnia, anxiety Endocrine: No palpitations, fatigue, diaphoresis, mood swings, change in appetite, change in weight, increased thirst Hematologic/Lymphatic:  No anemia, purpura, petechiae. Allergic/Immunologic: no itchy/runny eyes, nasal congestion, recent allergic reactions, rashes  PHYSICAL EXAM: Filed Vitals:   08/03/13 0827  BP: 100/60  Pulse: 78  Temp: 98.2 F (36.8 C)   General: No acute distress Head:  Normocephalic/atraumatic Neck: supple, no paraspinal tenderness, full range of motion Heart:  Regular rate and rhythm Lungs:  Clear to auscultation bilaterally Back: No paraspinal tenderness Neurological  Exam: alert and oriented to person, place, and time. Speech fluent and not dysarthric, language intact.  CN II-XII intact. Fundoscopic exam unremarkable, no papilledema.  Bulk and tone normal, muscle strength 5/5 throughout.  Sensation to light touch, temperature and vibration intact.  Deep tendon reflexes 2+ throughout, toes downgoing.  Finger to nose intact.  Gait slow and cautious, able to walk in tandem, Romberg with mild sway.  IMPRESSION: Paresthesias.  Etiology unknown.  Normal neurological exam.  We will test more thoroughly for small fiber neuropathy.  PLAN: 1.  Check 2hr glucose tolerance test, fasting lipid panel (looking at triglycerides), SPEP/IFE, ANA, ESR, SS-a/SS-b antibodies, B1, B6, E 2.  NCV-EMG 3.  Increase gabapentin to 300mg  TID 4.  Follow up after EMG.  Shon Millet, DO  CC:  Evelene Croon, MD

## 2013-08-03 NOTE — Patient Instructions (Addendum)
We will check the blood for other possible causes of numbness. We will also get a nerve conduction study/EMG to test the nerves for problems. Increase the gabapentin to three times daily. Follow up after EMG.   I will call you and let you know about your blood work. It will have to be done at the lab on [facility] (across from [facility])  in the basement of the [employer].

## 2013-08-03 NOTE — Addendum Note (Signed)
Addended by: Benay Spice on: 08/03/2013 11:52 AM   Modules accepted: Orders

## 2013-08-03 NOTE — Telephone Encounter (Signed)
I spoke with the pt and she states Dr Lacie Scotts just made her aware that she has gallstones (he has given her a copy of test results to take to PCP).  The pt is scheduled to see Dr Sharen Hones next week and she has not been referred to a surgeon at this time.  I made the pt aware that she should follow-up with PCP next week and they can refer the pt to surgeon if needed.  Once the pt is evaluated by a surgeon then our office will be contacted in regards to surgical clearance request.

## 2013-08-03 NOTE — Telephone Encounter (Signed)
Called and spoke with [place]. Let her know that the 2 hour glucose tolerance test appointment was Friday, November 7th at 0730 am at [address] in the [employer] building's basement. Understands NPO after mn except for water; no gum, no smoking and no coffee. Will have all additional labs drawn there as well. Orders in EPIC. She also informed me that she just found out she has gallstones. She asked that I let Dr. Everlena Cooper know and I told her that I would. **Dr. Everlena Cooper, [place] Child[place].

## 2013-08-03 NOTE — Telephone Encounter (Signed)
New problem    Need cardiac clearance for upcoming surgery .   Need a referral to surgeon.

## 2013-08-04 ENCOUNTER — Encounter: Payer: BC Managed Care – PPO | Admitting: Internal Medicine

## 2013-08-05 ENCOUNTER — Other Ambulatory Visit: Payer: Self-pay

## 2013-08-05 ENCOUNTER — Telehealth: Payer: Self-pay | Admitting: Cardiovascular Disease

## 2013-08-05 NOTE — Telephone Encounter (Signed)
Spoke with pt; she is aware that we have not received the surgical clearance form from the surgeon's office, at this time. Pt state the surgeon's office states that the  form was faxed yesterday 08/04/13.

## 2013-08-05 NOTE — Telephone Encounter (Signed)
New message    Did we receive a fax to clear pt for gallstone surgery?

## 2013-08-10 ENCOUNTER — Encounter: Payer: Self-pay | Admitting: Family Medicine

## 2013-08-11 ENCOUNTER — Ambulatory Visit (INDEPENDENT_AMBULATORY_CARE_PROVIDER_SITE_OTHER): Payer: BC Managed Care – PPO | Admitting: Family Medicine

## 2013-08-11 ENCOUNTER — Encounter: Payer: Self-pay | Admitting: Family Medicine

## 2013-08-11 VITALS — BP 122/74 | HR 76 | Temp 98.4°F | Wt 203.8 lb

## 2013-08-11 DIAGNOSIS — E876 Hypokalemia: Secondary | ICD-10-CM

## 2013-08-11 DIAGNOSIS — Z23 Encounter for immunization: Secondary | ICD-10-CM

## 2013-08-11 DIAGNOSIS — R109 Unspecified abdominal pain: Secondary | ICD-10-CM | POA: Insufficient documentation

## 2013-08-11 DIAGNOSIS — E039 Hypothyroidism, unspecified: Secondary | ICD-10-CM

## 2013-08-11 LAB — HEPATIC FUNCTION PANEL
Albumin: 4 g/dL (ref 3.5–5.2)
Total Protein: 7.8 g/dL (ref 6.0–8.3)

## 2013-08-11 LAB — TSH: TSH: 0.71 u[IU]/mL (ref 0.35–5.50)

## 2013-08-11 NOTE — Patient Instructions (Addendum)
Flu shot today. Blood work today. We will order ultrasound to check on gallstones and may refer you to surgeon for further evaluation. Good to see you today, call us with questions.

## 2013-08-11 NOTE — Addendum Note (Signed)
Addended by: Josph Macho A on: 08/11/2013 09:52 AM   Modules accepted: Orders

## 2013-08-11 NOTE — Telephone Encounter (Signed)
The pt needs to formally see a surgeon for gall bladder surgery before cardiac clearance can be addressed. We did receive a fax from Dr Lacie Scotts but this is not a formal request from a Careers adviser. Will await request.

## 2013-08-11 NOTE — Assessment & Plan Note (Signed)
With bloating in h/o gallstones per prior PCP. Will obtain formal abd [place] to further eval this as well as blood work today to check liver functions. If positive, will refer to surgery for further evaluation.  Pt agrees with plan, aware will need cardiac clearance if surgery is recommended.

## 2013-08-11 NOTE — Progress Notes (Signed)
Subjective:    Patient ID: [name], female    DOB: [birth year], 42 y.o.   MRN: [id number]  HPI CC: re establish care  42 yo pt last seen 2013 with h/o hypertrophic obstructive cardiomyopathy s/p septal myomectomy with implantable defibrillator 01/27/2012 who was followed over the last year by Dr. Lacie Scotts presents today to re establish care.  Sees Dr. Excell Seltzer and Ladona Ridgel.  Sees Dr. Allena Katz at [facility] - pending appt later this month.  Discussing possible heart transplant.  For chronic back pain - sees [facility] - prescribed oxycodone 5/3205mg .  Also takes tylenol and rare tramadol.  Would like to discuss recent dx gallstones - describes 1 mo h/o daily intermittent R subscapular pain, associated with bloating.  Bloating worse with certain foods.  Tries to avoid breads and rice which cause more bloating.  No RUQ pain per se.  No nausea/vomiting, fevers/chills.  Alternates constipation with diarrhea.  Had US done recently - at prior PCP's office Dr. Lacie Scotts showing gallstones.  Tyelnol and oxycodone improves pain. Asks about surgery for gallstones.  Past Medical History  Diagnosis Date  . Hypertrophic cardiomyopathy     s/p septal myomectomy with implantable defibrillator 01/27/2012   . Anxiety state, unspecified   . Allergic rhinitis     to dogs  . Asthma   . History of anemia     during pregnancy  . History of chicken pox   . Generalized headaches   . Ocular migraine     no HA  . Back pain     told cracked bone, vicodin didn't help, improved with percocets/muscle relaxants, no eval by prior PCP  . 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 DVT (deep venous thrombosis)     Diagnosed 02/13/12  - was placed on Warfarin after cardiac surgery but had skin reaction to warfarin so was changed to Pradaxa so DVT occurred while on Pradaxa although unclear if it occurred during transition  . Hypotension   . Atrial fibrillation     a. On  pradaxa.  coumadin necrosis with warfarin;  b. Amio d/c'd 02/2012  . ICD (implantable cardiac defibrillator) in place     02/04/2012, [employer]  . Pericardial effusion   . Chronic combined systolic and diastolic CHF (congestive heart failure)     a. echo 03/19/12: severe asymmetric septal hypertrophy, evidence of upper septal myomectomy, peak LV outflow tract gradient 35, EF 35%,;  b.  Echo 9/13: Severe LVH, EF 30-35%, anteroseptal and inferior HK, LVOT narrow, mild AI, mild to moderate mitral stenosis, severe LAE  . Pericardial tamponade 03/14/2012  . 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 01/2012 s/p BSX ICD;  b. 06/2012 Multaq initiated due to recurrent syncope, VT/VF - had severe n/v with IV amio.  . Skin necrosis --COUMADIN   . Chronic chest wall pain     eval by CVTS, stable, rec return to [facility] if continued pain  . Syncope     a. in setting of VT/VF.  Marland Kitchen Hypothyroid     Past Surgical History  Procedure Laterality Date  . Cesarean section  1991  . Induced abortion  1990 and 1993  . Cardiac surgery  01/27/2012    median sternotomy, septal myectomy, MVR - [facility] (Dr. Silvestre Mesi)  . Cardiac defibrillator placement  01/2012  . Myomectomy    . Pericardial window  03/14/2012    Procedure: PERICARDIAL WINDOW;  Surgeon: Salvatore Decent  Cornelius Moras, MD;  Location: [facility] OR;  Service: Open Heart Surgery;  Laterality: N/A;  Subxyphoid Pericardial  Window     Review of Systems Per HPI    Objective:   Physical Exam  Nursing note and vitals reviewed. Constitutional: She appears well-developed and well-nourished. No distress.  HENT:  Mouth/Throat: Oropharynx is clear and moist. No oropharyngeal exudate.  Cardiovascular: Normal rate, regular rhythm and intact distal pulses.   Murmur (3/6 SEM throught precordium) heard. Pulmonary/Chest: Effort normal and breath sounds normal. No respiratory distress. She has no wheezes. She has no rales.  Abdominal: Soft. Bowel sounds are normal. She exhibits no distension  and no mass. There is no hepatosplenomegaly. There is tenderness (mild) in the right upper quadrant. There is no rigidity, no rebound, no guarding, no CVA tenderness and negative Murphy's sign.  Musculoskeletal: Normal range of motion. She exhibits no edema.       Assessment & Plan:

## 2013-08-11 NOTE — Progress Notes (Signed)
Pre-visit discussion using our clinic review tool. No additional management support is needed unless otherwise documented below in the visit note.  

## 2013-08-16 ENCOUNTER — Telehealth: Payer: Self-pay | Admitting: Family Medicine

## 2013-08-16 ENCOUNTER — Other Ambulatory Visit: Payer: Self-pay | Admitting: Neurology

## 2013-08-16 ENCOUNTER — Ambulatory Visit (INDEPENDENT_AMBULATORY_CARE_PROVIDER_SITE_OTHER): Payer: BC Managed Care – PPO

## 2013-08-16 ENCOUNTER — Ambulatory Visit
Admission: RE | Admit: 2013-08-16 | Discharge: 2013-08-16 | Disposition: A | Discharge status: Home / Self Care | Payer: BC Managed Care – PPO | Source: Ambulatory Visit | Attending: Family Medicine | Admitting: Family Medicine

## 2013-08-16 DIAGNOSIS — R200 Anesthesia of skin: Secondary | ICD-10-CM

## 2013-08-16 DIAGNOSIS — R109 Unspecified abdominal pain: Secondary | ICD-10-CM

## 2013-08-16 DIAGNOSIS — R209 Unspecified disturbances of skin sensation: Secondary | ICD-10-CM

## 2013-08-16 LAB — LIPID PANEL
Cholesterol: 192 mg/dL (ref 0–200)
LDL Cholesterol: 113 mg/dL — ABNORMAL HIGH (ref 0–99)
Total CHOL/HDL Ratio: 4
Triglycerides: 152 mg/dL — ABNORMAL HIGH (ref 0.0–149.0)

## 2013-08-16 LAB — GLUCOSE TOLERANCE, 2 HOURS
Glucose, 1 Hour GTT: 166 mg/dL
Glucose, 2 hour: 157 mg/dL
Glucose, Fasting: 95 mg/dL (ref 70–99)

## 2013-08-16 LAB — SEDIMENTATION RATE: Sed Rate: 13 mm/hr (ref 0–22)

## 2013-08-16 NOTE — Telephone Encounter (Signed)
Caller: [name]/Patient; Phone: [phone]; Reason for Call: Patient calling to request Rx for antibiotics prior to dental work (scheduled for 08/20/13) since she has heart condition.  Uses [facility] on [address] [phone].  Please follow up regarding this request.  Thank you.

## 2013-08-17 ENCOUNTER — Telehealth: Payer: Self-pay | Admitting: *Deleted

## 2013-08-17 LAB — SJOGREN'S SYNDROME ANTIBODS(SSA + SSB): SSB (La) (ENA) Antibody, IgG: <1 AU/mL (ref <30)

## 2013-08-17 MED ORDER — AMOXICILLIN 500 MG PO CAPS: 2000.0000 mg | ORAL_CAPSULE | Freq: Once | ORAL | Status: DC

## 2013-08-17 NOTE — Telephone Encounter (Signed)
Patient notified

## 2013-08-17 NOTE — Telephone Encounter (Signed)
Apparently patient called [facility] yesterday and message wasn't sent to you-she needs abx for upcoming dental procedure due to heart condition. Please send to [facility] on [address].

## 2013-08-17 NOTE — Telephone Encounter (Signed)
plz notify I've sent in antibiotic for her dental procedure.

## 2013-08-18 ENCOUNTER — Other Ambulatory Visit: Payer: Self-pay | Admitting: Family Medicine

## 2013-08-18 DIAGNOSIS — K8020 Calculus of gallbladder without cholecystitis without obstruction: Secondary | ICD-10-CM

## 2013-08-18 DIAGNOSIS — R109 Unspecified abdominal pain: Secondary | ICD-10-CM

## 2013-08-18 LAB — IMMUNOFIXATION, SERUM
IgA/Immunoglobulin A, Serum: 314 mg/dL (ref 91–414)
IgG (Immunoglobin G), Serum: 1537 mg/dL (ref 700–1600)

## 2013-08-18 LAB — PROTEIN ELECTROPHORESIS, SERUM
Albumin ELP: 52.5 % — ABNORMAL LOW (ref 55.8–66.1)
Beta Globulin: 7.3 % — ABNORMAL HIGH (ref 4.7–7.2)
Total Protein, Serum Electrophoresis: 7.8 g/dL (ref 6.0–8.3)

## 2013-08-18 LAB — VITAMIN E: Gamma-Tocopherol (Vit E): 1.5 mg/L (ref <=4.3)

## 2013-08-19 ENCOUNTER — Other Ambulatory Visit: Payer: BC Managed Care – PPO

## 2013-08-19 LAB — VITAMIN B6: Vitamin B6: 8.3 ng/mL (ref 2.1–21.7)

## 2013-08-24 ENCOUNTER — Telehealth: Payer: Self-pay | Admitting: Internal Medicine

## 2013-08-24 NOTE — Telephone Encounter (Signed)
Records rec From [facility] Via Fax, gave to Scheduling Dept

## 2013-08-30 ENCOUNTER — Encounter (INDEPENDENT_AMBULATORY_CARE_PROVIDER_SITE_OTHER): Payer: Self-pay | Admitting: General Surgery

## 2013-08-30 ENCOUNTER — Other Ambulatory Visit (INDEPENDENT_AMBULATORY_CARE_PROVIDER_SITE_OTHER): Payer: Self-pay | Admitting: Surgery

## 2013-08-30 ENCOUNTER — Encounter (INDEPENDENT_AMBULATORY_CARE_PROVIDER_SITE_OTHER): Payer: Self-pay | Admitting: Surgery

## 2013-08-30 ENCOUNTER — Ambulatory Visit (INDEPENDENT_AMBULATORY_CARE_PROVIDER_SITE_OTHER): Payer: BC Managed Care – PPO | Admitting: Surgery

## 2013-08-30 VITALS — BP 110/66 | HR 72 | Temp 98.6°F | Resp 15 | Ht 60.0 in | Wt 208.8 lb

## 2013-08-30 DIAGNOSIS — K8010 Calculus of gallbladder with chronic cholecystitis without obstruction: Secondary | ICD-10-CM

## 2013-08-30 DIAGNOSIS — K8020 Calculus of gallbladder without cholecystitis without obstruction: Secondary | ICD-10-CM
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
## 2013-08-31 ENCOUNTER — Ambulatory Visit: Payer: BC Managed Care – PPO | Admitting: Physician Assistant

## 2013-08-31 ENCOUNTER — Telehealth: Payer: Self-pay | Admitting: Internal Medicine

## 2013-08-31 NOTE — Telephone Encounter (Signed)
Error, 

## 2013-09-01 ENCOUNTER — Telehealth: Payer: Self-pay | Admitting: Neurology

## 2013-09-01 NOTE — Telephone Encounter (Signed)
Pt cancelled EMG sch for tomorrow. She did not wish to r/s / [name]

## 2013-09-02 ENCOUNTER — Encounter: Payer: BC Managed Care – PPO | Admitting: Neurology

## 2013-09-02 ENCOUNTER — Encounter: Payer: Self-pay | Admitting: Neurology

## 2013-09-06 ENCOUNTER — Encounter: Payer: Self-pay | Admitting: Internal Medicine

## 2013-09-08 ENCOUNTER — Encounter: Payer: Self-pay | Admitting: Physician Assistant

## 2013-09-08 ENCOUNTER — Ambulatory Visit (INDEPENDENT_AMBULATORY_CARE_PROVIDER_SITE_OTHER): Payer: BC Managed Care – PPO | Admitting: Physician Assistant

## 2013-09-08 VITALS — BP 122/70 | HR 80 | Ht 60.0 in | Wt 211.0 lb

## 2013-09-08 DIAGNOSIS — Z95810 Presence of automatic (implantable) cardiac defibrillator: Secondary | ICD-10-CM

## 2013-09-08 DIAGNOSIS — I422 Other hypertrophic cardiomyopathy: Secondary | ICD-10-CM

## 2013-09-08 DIAGNOSIS — Z01818 Encounter for other preprocedural examination: Secondary | ICD-10-CM | POA: Insufficient documentation

## 2013-09-08 NOTE — Patient Instructions (Signed)
Your physician recommends that you keep your schedulde follow-up appointment with Dr. Ladona Ridgel    Your physician recommends that you continue on your current medications as directed. Please refer to the Current Medication list given to you today.

## 2013-09-08 NOTE — Progress Notes (Signed)
HPI:  this is a 42 year-old female patient of Dr. Excell Seltzer and Dr. Ladona Ridgel with hypertrophic cardiomyopathy presenting for preoperative evaluation before undergoing laparoscopic cholecystectomy. She has an extremely complicated cardiac history. Has had recurrent/refractory VT/VF despite attempted catheter ablation at [facility] this summer. She was hospitalized June 1st-July 11th at [facility]. Hospitalization was complicated by CDif. She underwent plasma exchange because of allosensitization as part of transplant evaluation/preparation. She was then treated with multiple antiarrhythmic drugs, ultimately being discharged on a combination of mexiletine and Ranexa. She last saw Dr. Ladona Ridgel on 06/17/13 at which time ICD interrogation demonstrated 1-1 tachycardia which is most consistent with SVT. Interrogation of her strips demonstrate initiation with a PAC with PR prolongation. No changes were made.  Patient was most recently seen at [facility] on 08/17/2013. They felt she was doing so well on her current medications that they did not want to proceed with cardiac transplant and told her she did not need to be seen at [facility] any more. She denies any chest pain, palpitations, dyspnea, dyspnea on exertion, and dizziness or presyncope. She is starting to do light housework including vacuuming and cooking. She is hoping she can start walking her dogs. She is mostly reluctant because of fear of something happening.    Allergies     -- Phenergan [Promethazine Hcl] -- Itching  -- Amiodarone -- Nausea And Vomiting   --  N/V on IV amio 06/2012  -- Nitroglycerin    --  Hypotension  -- Warfarin And Related -- Other (See Comments)   --  Scars on skin  Current Outpatient Prescriptions on File Prior to Visit: acetaminophen (TYLENOL) 500 MG tablet, Take 500 mg by mouth 3 (three) times daily as needed for pain., Disp: , Rfl:  ALPRAZolam (XANAX) 1 MG tablet, Take 1 mg by mouth 3 (three) times daily as needed for anxiety. , Disp: ,  Rfl:  aspirin EC 81 MG tablet, Take 81 mg by mouth daily., Disp: , Rfl:  beclomethasone (QVAR) 80 MCG/ACT inhaler, Inhale 1 puff into the lungs 2 (two) times daily., Disp: , Rfl:  cetirizine (ZYRTEC) 10 MG tablet, Take 10 mg by mouth every evening. , Disp: , Rfl:  furosemide (LASIX) 20 MG tablet, Take 1 tablet (20 mg total) by mouth every other day., Disp: 30 tablet, Rfl:  gabapentin (NEURONTIN) 300 MG capsule, Take 1 capsule (300 mg total) by mouth 3 (three) times daily., Disp: 90 capsule, Rfl: 6 levothyroxine (SYNTHROID, LEVOTHROID) 50 MCG tablet, Take 50 mcg by mouth daily before breakfast. , Disp: , Rfl:  magnesium gluconate (MAGONATE) 500 MG tablet, Take 250 mg by mouth 2 (two) times daily., Disp: , Rfl:  metoprolol succinate (TOPROL-XL) 25 MG 24 hr tablet, Take 12.5 mg by mouth daily. , Disp: , Rfl:  mexiletine (MEXITIL) 150 MG capsule, Take 300 mg by mouth 3 (three) times daily. , Disp: , Rfl:  oxyCODONE-acetaminophen (PERCOCET/ROXICET) 5-325 MG per tablet, Take 1 tablet by mouth 3 (three) times daily as needed for pain., Disp: , Rfl:  Potassium Gluconate 595 MG CAPS, Take 595 mg by mouth daily., Disp: , Rfl:  ranolazine (RANEXA) 1000 MG SR tablet, Take 1,000 mg by mouth 2 (two) times daily. , Disp: , Rfl:   No current facility-administered medications on file prior to visit.   Past Medical History:   Hypertrophic cardiomyopathy  Comment:s/p septal myomectomy with implantable               defibrillator 01/27/2012    Anxiety state, unspecified                                   Allergic rhinitis                                              Comment:to dogs   Asthma                                                       History of anemia                                              Comment:during pregnancy   History of chicken pox                                       Generalized headaches                                        Ocular migraine                                                 Comment:no HA   Back pain                                                      Comment:told cracked bone, vicodin didn't help,               improved with percocets/muscle relaxants, no               eval by prior PCP   S/P MVR (mitral valve repair)                   12/2011         Comment:dental ppx needed, rec anticoagulation for 3-6               mo   DVT (deep venous thrombosis)                                   Comment:Diagnosed 02/13/12  - was placed on Warfarin               after cardiac surgery but had skin reaction to               warfarin so was changed to Pradaxa  so DVT               occurred while on Pradaxa although unclear if               it occurred during transition   Hypotension                                                  Atrial fibrillation                                            Comment:a. On pradaxa.  coumadin necrosis with               warfarin;  b. Amio d/c'd 02/2012   ICD (implantable cardiac defibrillator) in pla*                Comment:02/04/2012, [employer]   Pericardial effusion                                         Chronic combined systolic and diastolic CHF (c*                Comment:a. echo 03/19/12: severe asymmetric septal               hypertrophy, evidence of upper septal               myomectomy, peak LV outflow tract gradient 35,               EF 35%,;  b.  Echo 9/13: Severe LVH, EF 30-35%,              anteroseptal and inferior HK, LVOT narrow, mild              AI, mild to moderate mitral stenosis, severe               LAE   Pericardial tamponade                           03/14/2012    Ventricular fibrillation                        9/13, 10/*     Comment:a. 01/2012 s/p BSX ICD;  b. 06/2012 Multaq               initiated due to recurrent syncope, VT/VF - had              severe n/v with IV amio.   Skin necrosis --COUMADIN                                     Chronic chest wall pain                                         Comment:eval by CVTS, stable, rec return to [facility] if  continued pain   Syncope                                                        Comment:a. in setting of VT/VF.   Hypothyroid                                                  Anemia                                                       Blood transfusion without reported diagnosis                 Heart murmur                                                 Neuromuscular disorder                                      Past Surgical History:   CESAREAN SECTION                                 1991         INDUCED ABORTION                                 1990 and *   CARDIAC SURGERY                                  01/27/2012      Comment:median sternotomy, septal myectomy, MVR - [facility]               (Dr. Silvestre Mesi)   CARDIAC DEFIBRILLATOR PLACEMENT                  01/2012       MYOMECTOMY                                                    PERICARDIAL WINDOW                               03/14/2012      Comment:Procedure: PERICARDIAL WINDOW;  Surgeon:               Purcell Nails, MD;  Location: [facility] OR;                Service: Open Heart Surgery;  Laterality:  N/A;               Subxyphoid Pericardial  Window   Review of patient's family history indicates:   Diabetes                       Father                   Heart disease                  Father                     Comment: unspecified heart problem   Thyroid disease                Mother                   Anemia                         Mother                     Comment: mediterranean anemia, ITP   Heart disease                  Maternal Grandfather       Comment: CHF   Diabetes                       Paternal Grandfather     Cancer                         Paternal Grandfather     Coronary artery disease        Neg Hx                   Stroke                         Neg Hx                   Sudden death                   Neg Hx                    Social History   Marital Status: Single              Spouse Name:                      Years of Education:                 Number of children: 1           Occupational History Occupation          Psychologist, clinical                        Social History Main Topics   Smoking Status: Former Smoker                   Packs/Day: 1.00  Years: 3         Types: Cigarettes     Quit date:  09/30/1988   Smokeless Status: Never Used                       Comment: quitin 1990   Alcohol Use: No                Comment: none since "months" as of 02/28/2013   Drug Use: No                Comment: quiti n 1989   Sexual Activity: Yes                    Birth Control/Protection: Pill  Other Topics            Concern   None on file  Social History Narrative   Caffeine: 1 cup coffee/day   Divorced, 1 child.  lives with boyfriend, dogs   Occupation: Full time Tax adviser   Activity: walks 49min/day   Diet: fruits/vegetables daily, water, no fish    ROS: See history of present illness otherwise negative   PHYSICAL EXAM: Well-nournished, in no acute distress. Neck: No JVD, HJR, Bruit, or thyroid enlargement  Lungs: No tachypnea, clear without wheezing, rales, or rhonchi  Cardiovascular: RRR, 3/6 harsh systolic murmur at the left sternal border, no gallops, bruit, thrill, or heave.  Abdomen: BS normal. Soft without organomegaly, masses, lesions or tenderness.  Extremities: Some ankle edema without cyanosis, clubbing. Good distal pulses bilateral  SKin: Warm, no lesions or rashes   Musculoskeletal: No deformities  Neuro: no focal signs  BP 122/70  Pulse 80  Ht 5' (1.524 m)  Wt 211 lb (95.709 kg)  BMI 41.21 kg/m2  SpO2 99%  LMP 08/09/2013

## 2013-09-08 NOTE — Assessment & Plan Note (Signed)
Patient is here for preoperative cardiac clearance before undergoing laparoscopic cholecystectomy. I discussed this patient in detail with Dr. Johney Frame. She has a very complicated cardiac history with hypertrophic cardiomyopathy status post myomectomy with recurrent ventricular tachycardia and fibrillation. She has an ICD implantation and underwent unsuccessful ablation of ventricular tachycardia at [facility]. She is now been stable on mexiletine and Ranexa. She has had no ICD shocks. She is felt to be a very high-risk for any surgical procedure which should only be pursued if medically necessary. She should keep her appointment Dr. Ladona Ridgel on 09/16/13.

## 2013-09-08 NOTE — Assessment & Plan Note (Signed)
No ICD shocks. Doing well on mexiletine and Ranexa

## 2013-09-08 NOTE — Assessment & Plan Note (Signed)
Heart failure is stable. 

## 2013-09-09 ENCOUNTER — Encounter: Payer: Self-pay | Admitting: Neurology

## 2013-09-10 ENCOUNTER — Ambulatory Visit: Payer: BC Managed Care – PPO | Admitting: Physician Assistant

## 2013-09-13 ENCOUNTER — Encounter (HOSPITAL_COMMUNITY): Payer: Self-pay | Admitting: Emergency Medicine

## 2013-09-13 ENCOUNTER — Emergency Department (HOSPITAL_COMMUNITY): Payer: BC Managed Care – PPO

## 2013-09-13 ENCOUNTER — Emergency Department (HOSPITAL_COMMUNITY)
Admission: EM | Admit: 2013-09-13 | Discharge: 2013-09-13 | Disposition: A | Discharge status: Home / Self Care | Payer: BC Managed Care – PPO | Attending: Emergency Medicine | Admitting: Emergency Medicine

## 2013-09-13 DIAGNOSIS — F411 Generalized anxiety disorder: Secondary | ICD-10-CM | POA: Insufficient documentation

## 2013-09-13 DIAGNOSIS — IMO0002 Reserved for concepts with insufficient information to code with codable children: Secondary | ICD-10-CM | POA: Insufficient documentation

## 2013-09-13 DIAGNOSIS — I5042 Chronic combined systolic (congestive) and diastolic (congestive) heart failure: Secondary | ICD-10-CM | POA: Insufficient documentation

## 2013-09-13 DIAGNOSIS — R1013 Epigastric pain: Secondary | ICD-10-CM | POA: Insufficient documentation

## 2013-09-13 DIAGNOSIS — R109 Unspecified abdominal pain: Secondary | ICD-10-CM

## 2013-09-13 DIAGNOSIS — R011 Cardiac murmur, unspecified: Secondary | ICD-10-CM | POA: Insufficient documentation

## 2013-09-13 DIAGNOSIS — Z862 Personal history of diseases of the blood and blood-forming organs and certain disorders involving the immune mechanism: Secondary | ICD-10-CM | POA: Insufficient documentation

## 2013-09-13 DIAGNOSIS — Z7982 Long term (current) use of aspirin: Secondary | ICD-10-CM | POA: Insufficient documentation

## 2013-09-13 DIAGNOSIS — R1011 Right upper quadrant pain: Secondary | ICD-10-CM | POA: Insufficient documentation

## 2013-09-13 DIAGNOSIS — Z872 Personal history of diseases of the skin and subcutaneous tissue: Secondary | ICD-10-CM | POA: Insufficient documentation

## 2013-09-13 DIAGNOSIS — G43809 Other migraine, not intractable, without status migrainosus: Secondary | ICD-10-CM | POA: Insufficient documentation

## 2013-09-13 DIAGNOSIS — E039 Hypothyroidism, unspecified: Secondary | ICD-10-CM | POA: Insufficient documentation

## 2013-09-13 DIAGNOSIS — Z8719 Personal history of other diseases of the digestive system: Secondary | ICD-10-CM | POA: Insufficient documentation

## 2013-09-13 DIAGNOSIS — Z87891 Personal history of nicotine dependence: Secondary | ICD-10-CM | POA: Insufficient documentation

## 2013-09-13 DIAGNOSIS — Z8619 Personal history of other infectious and parasitic diseases: Secondary | ICD-10-CM | POA: Insufficient documentation

## 2013-09-13 DIAGNOSIS — Z79899 Other long term (current) drug therapy: Secondary | ICD-10-CM | POA: Insufficient documentation

## 2013-09-13 DIAGNOSIS — G8929 Other chronic pain: Secondary | ICD-10-CM | POA: Insufficient documentation

## 2013-09-13 DIAGNOSIS — Z95810 Presence of automatic (implantable) cardiac defibrillator: Secondary | ICD-10-CM | POA: Insufficient documentation

## 2013-09-13 DIAGNOSIS — Z86718 Personal history of other venous thrombosis and embolism: Secondary | ICD-10-CM | POA: Insufficient documentation

## 2013-09-13 DIAGNOSIS — J45909 Unspecified asthma, uncomplicated: Secondary | ICD-10-CM | POA: Insufficient documentation

## 2013-09-13 HISTORY — DX: Calculus of gallbladder without cholecystitis without obstruction: K80.20

## 2013-09-13 LAB — CBC WITH DIFFERENTIAL/PLATELET
Basophils Absolute: 0.1 10*3/uL (ref 0.0–0.1)
Basophils Relative: 1 % (ref 0–1)
Eosinophils Absolute: 0.3 10*3/uL (ref 0.0–0.7)
Eosinophils Relative: 5 % (ref 0–5)
Lymphocytes Relative: 34 % (ref 12–46)
MCH: 28 pg (ref 26.0–34.0)
MCHC: 32.7 g/dL (ref 30.0–36.0)
MCV: 85.8 fL (ref 78.0–100.0)
Monocytes Absolute: 0.6 10*3/uL (ref 0.1–1.0)
Neutrophils Relative %: 48 % (ref 43–77)
Platelets: 317 10*3/uL (ref 150–400)
RDW: 14.8 % (ref 11.5–15.5)
WBC: 5 10*3/uL (ref 4.0–10.5)

## 2013-09-13 LAB — POCT I-STAT TROPONIN I: Troponin i, poc: 0.04 ng/mL (ref 0.00–0.08)

## 2013-09-13 LAB — COMPREHENSIVE METABOLIC PANEL
ALT: 28 U/L (ref 0–35)
AST: 28 U/L (ref 0–37)
Albumin: 4.1 g/dL (ref 3.5–5.2)
Alkaline Phosphatase: 76 U/L (ref 39–117)
Calcium: 9.2 mg/dL (ref 8.4–10.5)
Chloride: 100 mEq/L (ref 96–112)
Creatinine, Ser: 0.72 mg/dL (ref 0.50–1.10)
GFR calc Af Amer: >90 mL/min (ref >90)
Glucose, Bld: 97 mg/dL (ref 70–99)
Sodium: 138 mEq/L (ref 135–145)
Total Protein: 8.3 g/dL (ref 6.0–8.3)

## 2013-09-13 LAB — LIPASE, BLOOD: Lipase: 24 U/L (ref 11–59)

## 2013-09-13 MED ORDER — OXYCODONE-ACETAMINOPHEN 5-325 MG PO TABS: 1.0000 | ORAL_TABLET | Freq: Three times a day (TID) | ORAL | Status: DC | PRN

## 2013-09-13 NOTE — ED Notes (Signed)
Pt with significant cardiac hx to ED c/o acute onset epigastric pain since this morning.  Pain is intermittent and is not accompanied by nausea or sob.  Denies diarrhea or constipation.

## 2013-09-13 NOTE — ED Provider Notes (Signed)
CSN: [id number]     Arrival date & time 09/13/13  1204 History   First MD Initiated Contact with Patient 09/13/13 1439     Chief Complaint  Patient presents with  . Abdominal Pain    cardiac hx   (Consider location/radiation/quality/duration/timing/severity/associated sxs/prior Treatment) HPI Comments: 42 year old female past medical history significant for multiple cardiac issues and known gallstones who presents with abdominal pain. Patient states she's been having waxing and waning abdominal pain which is mainly right upper quadrant and epigastric. Patient states this is 10 out of 10 when the pain is present however is 0/10 at all other times. On my exam patient states she's not had any pain for several hours. Patient states she was diagnosed with gallstones previously hours not a candidate for surgical intervention because of multiple cardiac.  Patient is a 42 y.o. female presenting with abdominal pain.  Abdominal Pain Pain location:  Epigastric and RUQ Pain quality: sharp   Pain radiates to:  Does not radiate Pain severity now: on exam no pain. At worst 10/10. Onset quality:  Unable to specify Duration:  1 day Timing:  Intermittent Progression:  Waxing and waning Relieved by:  Nothing Worsened by:  Nothing tried Ineffective treatments:  None tried Associated symptoms: no chest pain, no dysuria, no fever, no shortness of breath and no vomiting     Past Medical History  Diagnosis Date  . Hypertrophic cardiomyopathy     s/p septal myomectomy with implantable defibrillator 01/27/2012   . Anxiety state, unspecified   . Allergic rhinitis     to dogs  . Asthma   . History of anemia     during pregnancy  . History of chicken pox   . Generalized headaches   . Ocular migraine     no HA  . Back pain     told cracked bone, vicodin didn't help, improved with percocets/muscle relaxants, no eval by prior PCP  . 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 DVT (deep venous thrombosis)     Diagnosed 02/13/12  - was placed on Warfarin after cardiac surgery but had skin reaction to warfarin so was changed to Pradaxa so DVT occurred while on Pradaxa although unclear if it occurred during transition  . Hypotension   . Atrial fibrillation     a. On pradaxa.  coumadin necrosis with warfarin;  b. Amio d/c'd 02/2012  . ICD (implantable cardiac defibrillator) in place     02/04/2012, [employer]  . Pericardial effusion   . Chronic combined systolic and diastolic CHF (congestive heart failure)     a. echo 03/19/12: severe asymmetric septal hypertrophy, evidence of upper septal myomectomy, peak LV outflow tract gradient 35, EF 35%,;  b.  Echo 9/13: Severe LVH, EF 30-35%, anteroseptal and inferior HK, LVOT narrow, mild AI, mild to moderate mitral stenosis, severe LAE  . Pericardial tamponade 03/14/2012  . 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 01/2012 s/p BSX ICD;  b. 06/2012 Multaq initiated due to recurrent syncope, VT/VF - had severe n/v with IV amio.  . Skin necrosis --COUMADIN   . Chronic chest wall pain     eval by CVTS, stable, rec return to [facility] if continued pain  . Syncope     a. in setting of VT/VF.  Marland Kitchen Hypothyroid   . Anemia   . Blood transfusion without reported diagnosis   . Heart murmur   . Neuromuscular disorder   .  Gall stones    Past Surgical History  Procedure Laterality Date  . Cesarean section  1991  . Induced abortion  1990 and 1993  . Cardiac surgery  01/27/2012    median sternotomy, septal myectomy, MVR - [facility] (Dr. Silvestre Mesi)  . Cardiac defibrillator placement  01/2012  . Myomectomy    . Pericardial window  03/14/2012    Procedure: PERICARDIAL WINDOW;  Surgeon: Purcell Nails, MD;  Location[facility] OR;  Service: Open Heart Surgery;  Laterality: N/A;  Subxyphoid Pericardial  Window    Family History  Problem Relation Age of Onset  . Diabetes Father   . Heart disease Father     unspecified heart  problem  . Thyroid disease Mother   . Anemia Mother     mediterranean anemia, ITP  . Heart disease Maternal Grandfather     CHF  . Diabetes Paternal Grandfather   . Cancer Paternal Grandfather   . Coronary artery disease Neg Hx   . Stroke Neg Hx   . Sudden death Neg Hx    History  Substance Use Topics  . Smoking status: Former Smoker -- 1.00 packs/day for 3 years    Types: Cigarettes    Quit date: 09/30/1988  . Smokeless tobacco: Never Used     Comment: quitin 1990  . Alcohol Use: No     Comment: none since "months" as of 02/28/2013   OB History   Grav Para Term Preterm Abortions TAB SAB Ect Mult Living                 Review of Systems  Constitutional: Negative for fever.  Respiratory: Negative for shortness of breath.   Cardiovascular: Negative for chest pain.  Gastrointestinal: Positive for abdominal pain. Negative for vomiting.  Genitourinary: Negative for dysuria.  Musculoskeletal: Negative for back pain.  Skin: Negative for rash.  Neurological: Negative for headaches.  Psychiatric/Behavioral: Negative for agitation.  All other systems reviewed and are negative.    Allergies  Phenergan; Amiodarone; Nitroglycerin; and Warfarin and related  Home Medications   Current Outpatient Rx  Name  Route  Sig  Dispense  Refill  . acetaminophen (TYLENOL) 500 MG tablet   Oral   Take 500 mg by mouth 3 (three) times daily as needed for pain.         Marland Kitchen ALPRAZolam (XANAX) 1 MG tablet   Oral   Take 1 mg by mouth 3 (three) times daily as needed for anxiety.          Marland Kitchen aspirin EC 81 MG tablet   Oral   Take 81 mg by mouth daily.         . beclomethasone (QVAR) 80 MCG/ACT inhaler   Inhalation   Inhale 1 puff into the lungs 2 (two) times daily.         . cetirizine (ZYRTEC) 10 MG tablet   Oral   Take 10 mg by mouth every evening.          . furosemide (LASIX) 20 MG tablet   Oral   Take 1 tablet (20 mg total) by mouth every other day.   30 tablet      .  gabapentin (NEURONTIN) 300 MG capsule   Oral   Take 1 capsule (300 mg total) by mouth 3 (three) times daily.   90 capsule   6   . levothyroxine (SYNTHROID, LEVOTHROID) 50 MCG tablet   Oral   Take 50 mcg by mouth daily before breakfast.          .  magnesium gluconate (MAGONATE) 500 MG tablet   Oral   Take 250 mg by mouth 2 (two) times daily.         . metoprolol succinate (TOPROL-XL) 25 MG 24 hr tablet   Oral   Take 12.5 mg by mouth daily.          Marland Kitchen mexiletine (MEXITIL) 150 MG capsule   Oral   Take 300 mg by mouth 3 (three) times daily.          . Potassium Gluconate 595 MG CAPS   Oral   Take 595 mg by mouth daily.         . ranolazine (RANEXA) 1000 MG SR tablet   Oral   Take 1,000 mg by mouth 2 (two) times daily.          Marland Kitchen oxyCODONE-acetaminophen (PERCOCET/ROXICET) 5-325 MG per tablet   Oral   Take 1 tablet by mouth 3 (three) times daily as needed.   15 tablet   0    BP 119/58  Pulse 73  Temp(Src) 98.1 F (36.7 C) (Oral)  Resp 22  SpO2 99%  LMP 09/08/2013 Physical Exam  Nursing note and vitals reviewed. Constitutional: She is oriented to person, place, and time. She appears well-developed and well-nourished.  HENT:  Head: Normocephalic and atraumatic.  Eyes: EOM are normal. Pupils are equal, round, and reactive to light.  Neck: Normal range of motion.  Cardiovascular: Normal rate, regular rhythm and intact distal pulses.   defib in place. No chest pain   Pulmonary/Chest: Effort normal and breath sounds normal. No respiratory distress. She exhibits no tenderness.  Abdominal: Soft. She exhibits no distension. There is tenderness. There is no rebound and no guarding.  Right upper quadrant tenderness with palpation. No pain when not being palpated.   Musculoskeletal: Normal range of motion.  Neurological: She is alert and oriented to person, place, and time. No cranial nerve deficit. She exhibits normal muscle tone. Coordination normal.  Skin: Skin  is warm and dry.  Psychiatric: She has a normal mood and affect. Her behavior is normal. Judgment and thought content normal.    ED Course  Procedures (including critical care time) Labs Review Labs Reviewed  CBC WITH DIFFERENTIAL - Abnormal; Notable for the following:    Monocytes Relative 13 (*)    All other components within normal limits  COMPREHENSIVE METABOLIC PANEL  LIPASE, BLOOD  POCT I-STAT TROPONIN I   Imaging Review Dg Chest 2 View  09/13/2013   CLINICAL DATA:  Chest pain  EXAM: CHEST  2 VIEW  COMPARISON:  05/15/2013  FINDINGS: Cardiomegaly again noted. Status Clodagh Odenthal median sternotomy. 3 leads cardiac pacemaker is unchanged in position. No acute infiltrate or pulmonary edema.  IMPRESSION: No active cardiopulmonary disease.   Electronically Signed   By: Natasha Mead M.D.   On: 09/13/2013 13:06    EKG Interpretation    Date/Time:  Monday September 13 2013 12:05:56 EST Ventricular Rate:  75 PR Interval:  222 QRS Duration: 168 QT Interval:  458 QTC Calculation: 511 R Axis:   -61 Text Interpretation:  Electronic atrial pacemaker Left axis deviation Left bundle branch block No significant change since last tracing Confirmed by STEINL  MD, KEVIN ([id number]) on 09/13/2013 3:04:24 PM            MDM   1. Abdominal pain     Afebrile vital signs stable on arrival. Patient with known history of cholelithiasis presenting with right upper quadrant pain. Patient was concerned this  could be cardiac pain and came to ED. EKG unchanged from previous. No concerning signs of cardiac ischemia. Negative troponin. History not consistent with cardiac type pain. Patient with tenderness palpation of the Murphy's point. Not febrile no leukocytosis. ultrasound confirmed cholelithiasis one month ago. CMP showed no labs consistent with biliary obstruction. Doubt cholecystitis, choledocholithiasis or other concerning biliary pathology. Pain consistent with biliary colic. Patient given return precautions  including development of fevers, worsening pain etc. Follow up with PCP to continue discuss options for possible cholecystectomy if continues to be problematic.  Bridgett Larsson, MD 09/13/13 [phone]

## 2013-09-14 NOTE — ED Provider Notes (Signed)
I saw and evaluated the patient, reviewed the resident's note and I agree with the findings and plan.  EKG Interpretation    Date/Time:  Monday September 13 2013 12:05:56 EST Ventricular Rate:  75 PR Interval:  222 QRS Duration: 168 QT Interval:  458 QTC Calculation: 511 R Axis:   -61 Text Interpretation:  Electronic atrial pacemaker Left axis deviation Left bundle branch block No significant change since last tracing Confirmed by Alwin Lanigan  MD, Geanie Pacifico ([id number]) on 09/13/2013 3:04:24 PM            Pt with hx gallstones, presents w ruq pain, similar to prior gallstone pain.  Pt felt not surgical candidate previously due to extensive hx.   No cp. Labs. Pt appears comfortable, nad. No nv.   Suzi Roots, MD 09/14/13 [phone]

## 2013-09-16 ENCOUNTER — Encounter: Payer: BC Managed Care – PPO | Admitting: Internal Medicine

## 2013-09-16 ENCOUNTER — Ambulatory Visit: Payer: BC Managed Care – PPO | Admitting: Neurology

## 2013-09-21 ENCOUNTER — Telehealth: Payer: Self-pay | Admitting: Cardiovascular Disease

## 2013-09-21 NOTE — Telephone Encounter (Signed)
New Problem:  Pt states she just drunk two mugs of green tea with caffiene. Pt states due to her heart problem she is suppose to stay away from caffiene. Pt states her nerves are tore up and she would like a call back as soon as possible.

## 2013-09-21 NOTE — Telephone Encounter (Signed)
I spoke with the pt and she does not consume caffeine but today she accidentally picked up the wrong bottle of green tea.  I made the pt aware that she cannot do anything about the caffeine that she has already consumed.  I made her aware that she may have some palpitations and increased heart rate due to green tea and if she has any questions or concerns or ICD fires then she should contact our office.  Pt agreed with plan.

## 2013-09-27 ENCOUNTER — Encounter: Payer: Self-pay | Admitting: Internal Medicine

## 2013-09-28 ENCOUNTER — Ambulatory Visit (INDEPENDENT_AMBULATORY_CARE_PROVIDER_SITE_OTHER): Payer: BC Managed Care – PPO | Admitting: Family Medicine

## 2013-09-28 ENCOUNTER — Encounter: Payer: Self-pay | Admitting: Family Medicine

## 2013-09-28 VITALS — BP 108/60 | HR 76 | Temp 98.6°F | Wt 211.5 lb

## 2013-09-28 DIAGNOSIS — R739 Hyperglycemia, unspecified: Secondary | ICD-10-CM

## 2013-09-28 DIAGNOSIS — R7309 Other abnormal glucose: Secondary | ICD-10-CM

## 2013-09-28 DIAGNOSIS — K8010 Calculus of gallbladder with chronic cholecystitis without obstruction: Secondary | ICD-10-CM

## 2013-09-28 DIAGNOSIS — I5042 Chronic combined systolic (congestive) and diastolic (congestive) heart failure: Secondary | ICD-10-CM

## 2013-09-28 DIAGNOSIS — I509 Heart failure, unspecified: Secondary | ICD-10-CM

## 2013-09-28 DIAGNOSIS — E669 Obesity, unspecified: Secondary | ICD-10-CM

## 2013-09-28 DIAGNOSIS — K5900 Constipation, unspecified: Secondary | ICD-10-CM

## 2013-09-28 DIAGNOSIS — F411 Generalized anxiety disorder: Secondary | ICD-10-CM

## 2013-09-28 DIAGNOSIS — I422 Other hypertrophic cardiomyopathy: Secondary | ICD-10-CM

## 2013-09-28 MED ORDER — ALPRAZOLAM 1 MG PO TABS: 1.0000 mg | ORAL_TABLET | Freq: Three times a day (TID) | ORAL | Status: DC | PRN

## 2013-09-28 MED ORDER — POLYETHYLENE GLYCOL 3350 17 GM/SCOOP PO POWD: 17.0000 g | Freq: Every day | ORAL | Status: DC | PRN

## 2013-09-28 NOTE — Progress Notes (Signed)
Pre-visit discussion using our clinic review tool. No additional management support is needed unless otherwise documented below in the visit note.  

## 2013-09-28 NOTE — Assessment & Plan Note (Signed)
Recent hyperglycemia with glu 166 after 1 hr oral GTT.  Check A1c today. Pt requests nutritionist referral for guidance - I agree this would be beneficial in this patient with complex medical history (combined heart failure, chronic cholelithiasis, and obesity) - would want their input for a customized nutritious diet plan and to also help with weight loss. Advised to check with cardiology for clearance for aerobic exercise.

## 2013-09-28 NOTE — Assessment & Plan Note (Signed)
Pt may consider eval at [facility] for second opinion for gallstone surgery.

## 2013-09-28 NOTE — Assessment & Plan Note (Signed)
refilled asthma, discussed importance of healthy coping and stress relieving strategies.

## 2013-09-28 NOTE — Patient Instructions (Addendum)
I've refilled alprazolam. We will check A1c today to evaluate for prediabetes. I will refer you to a nutritionist at [facility]. For constipation - increase fiber in diet (more fruits and vegetables) or start soluble fiber supplement like metamucil, continue colace, start miralax bulking agent to soften stools.  Hold for diarrhea.  Let me know if not improving with this. I want to get Dr. Randolm Idol and Taylor's recommendations on exercise.

## 2013-09-28 NOTE — Assessment & Plan Note (Signed)
Anticipate narcotic induced. Discussed need for bowel regimen while on narcotics. Continue colace, start miralax.  also discussed increase fiber in diet vs soluble fiber supplementation (metamucil).

## 2013-09-28 NOTE — Progress Notes (Signed)
Subjective:    Patient ID: [name], female    DOB: [birth year], 42 y.o.   MRN: [id number]  HPI CC: discuss nutritionist referral.  Re established with me last month.  Pleasant 42 yo patient of mine with very complicated cardiac history including hypertrophic cardiomyopathy s/p septal myomectomy and ICD placement 12/2011 complicated with pericardial tamponade and atrial and vetricular fibrillation  Recent dx biliary colic from gallstones but she was recommended against surgery by her cardiologist Dr. Excell Seltzer given complicated cardiac history.  Continued intermittent RUQ pain that wakes her up at night.  Would like refill of xanax which she uses for anxiety.  This was started in hospital and then refilled by Dr. Glenis Smoker.  States takes 1-3 tablets daily.  This helps her sleep.  Constipation - BM every 2-3 days.  Occasionally hard causing her to strain.  Taking colace twice daily but doesn't help.  Takes oxycodone tid prn.  Drinking a lot of water daily.  Advised against stimulants  Has been advised against straining, against exercising, was told she was not a candidate for cardiac rehab because was not able to have direct 1-1 supervision.  Asks about exercising at home and gym.    Medications and allergies reviewed and updated in chart.  Past histories reviewed and updated if relevant as below. Patient Active Problem List   Diagnosis Date Noted  . Obesity 09/28/2013  . Preoperative clearance 09/08/2013  . Chronic cholecystitis with calculus 08/30/2013  . Right sided abdominal pain 08/11/2013  . Hypovolemia 05/16/2013  . Atrial tachycardia 05/16/2013  . Hypokalemia 03/02/2013  . Hypothyroidism 03/02/2013  . History of mitral valve replacement 03/02/2013  . Syncope 07/27/2012  . VT (ventricular tachycardia) 07/27/2012  . Nausea & vomiting 07/27/2012  . History of DVT (deep vein thrombosis) 07/27/2012  . Chronic anticoagulation 07/27/2012  . Post-op pain 06/29/2012  .  Ventricular fibrillation 06/13/2012  . Automatic implantable cardioverter-defibrillator in situ 06/12/2012  . Chest pain at rest 03/09/2012  . Chronic combined systolic and diastolic CHF (congestive heart failure)   . DVT (deep venous thrombosis) 03/05/2012  . Hypertrophic cardiomyopathy/modest left ventricular dysfunction-EF 45-50% 08/30/2011  . Back pain   . Asthma 08/14/2011  . ANXIETY 03/09/2009   Past Medical History  Diagnosis Date  . Hypertrophic cardiomyopathy     s/p septal myomectomy with implantable defibrillator 01/27/2012   . Anxiety state, unspecified   . Allergic rhinitis     to dogs  . Asthma   . Anemia     during pregnancy  . History of chicken pox   . Generalized headaches   . Ocular migraine     no HA  . Back pain     told cracked bone, vicodin didn't help, improved with percocets/muscle relaxants, no eval by prior PCP  . 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 DVT (deep venous thrombosis) 01/2012    was placed on Warfarin after cardiac surgery but had skin reaction to warfarin so was changed to Pradaxa so DVT occurred while on Pradaxa although unclear if it occurred during transition  . Hypotension   . Atrial fibrillation     a. On pradaxa.  coumadin necrosis with warfarin;  b. Amio d/c'd 02/2012  . ICD (implantable cardiac defibrillator) in place     02/04/2012, [employer]  . Pericardial effusion   . Chronic combined systolic and diastolic CHF (congestive heart failure)     a.  echo 03/19/12: severe asymmetric septal hypertrophy, evidence of upper septal myomectomy, peak LV outflow tract gradient 35, EF 35%,;  b.  Echo 9/13: Severe LVH, EF 30-35%, anteroseptal and inferior HK, LVOT narrow, mild AI, mild to moderate mitral stenosis, severe LAE  . Pericardial tamponade 03/14/2012  . 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 01/2012 s/p BSX ICD;  b. 06/2012 Multaq initiated due to recurrent syncope, VT/VF -  had severe n/v with IV amio.  . Skin necrosis --COUMADIN   . Chronic chest wall pain     eval by CVTS, stable, rec return to [facility] if continued pain  . Syncope     a. in setting of VT/VF.  Marland Kitchen Hypothyroid   . Blood transfusion without reported diagnosis   . Neuromuscular disorder   . Gall stones    Past Surgical History  Procedure Laterality Date  . Cesarean section  1991  . Induced abortion  1990 and 1993  . Cardiac surgery  01/27/2012    median sternotomy, septal myectomy, MVR - [facility] (Dr. Silvestre Mesi)  . Cardiac defibrillator placement  01/2012  . Myomectomy    . Pericardial window  03/14/2012    Procedure: PERICARDIAL WINDOW;  Surgeon: Purcell Nails, MD;  Location: [facility] OR;  Service: Open Heart Surgery;  Laterality: N/A;  Subxyphoid Pericardial  Window    History  Substance Use Topics  . Smoking status: Former Smoker -- 1.00 packs/day for 3 years    Types: Cigarettes    Quit date: 09/30/1988  . Smokeless tobacco: Never Used     Comment: quitin 1990  . Alcohol Use: No     Comment: none since "months" as of 02/28/2013   Family History  Problem Relation Age of Onset  . Diabetes Father   . Heart disease Father     unspecified heart problem  . Thyroid disease Mother   . Anemia Mother     mediterranean anemia, ITP  . Heart disease Maternal Grandfather     CHF  . Diabetes Paternal Grandfather   . Cancer Paternal Grandfather   . Coronary artery disease Neg Hx   . Stroke Neg Hx   . Sudden death Neg Hx    Allergies  Allergen Reactions  . Phenergan [Promethazine Hcl] Itching  . Amiodarone Nausea And Vomiting    N/V on IV amio 06/2012  . Nitroglycerin     Hypotension  . Warfarin And Related Other (See Comments)    Scars on skin   Current Outpatient Prescriptions on File Prior to Visit  Medication Sig Dispense Refill  . acetaminophen (TYLENOL) 500 MG tablet Take 500 mg by mouth 3 (three) times daily as needed for pain.      Marland Kitchen aspirin EC 81 MG tablet Take 81 mg by mouth daily.       . beclomethasone (QVAR) 80 MCG/ACT inhaler Inhale 1 puff into the lungs 2 (two) times daily.      . cetirizine (ZYRTEC) 10 MG tablet Take 10 mg by mouth every evening.       . furosemide (LASIX) 20 MG tablet Take 1 tablet (20 mg total) by mouth every other day.  30 tablet    . gabapentin (NEURONTIN) 300 MG capsule Take 1 capsule (300 mg total) by mouth 3 (three) times daily.  90 capsule  6  . levothyroxine (SYNTHROID, LEVOTHROID) 50 MCG tablet Take 50 mcg by mouth daily before breakfast.       . magnesium gluconate (MAGONATE) 500 MG tablet Take 250  mg by mouth 2 (two) times daily.      . metoprolol succinate (TOPROL-XL) 25 MG 24 hr tablet Take 12.5 mg by mouth daily.       Marland Kitchen mexiletine (MEXITIL) 150 MG capsule Take 300 mg by mouth 3 (three) times daily.       Marland Kitchen oxyCODONE-acetaminophen (PERCOCET/ROXICET) 5-325 MG per tablet Take 1 tablet by mouth 3 (three) times daily as needed.  15 tablet  0  . Potassium Gluconate 595 MG CAPS Take 595 mg by mouth daily.      . ranolazine (RANEXA) 1000 MG SR tablet Take 1,000 mg by mouth 2 (two) times daily.        No current facility-administered medications on file prior to visit.     Review of Systems Per HPI    Objective:   Physical Exam  Nursing note and vitals reviewed. Constitutional: She appears well-developed and well-nourished. No distress.  Obese. Body mass index is 41.31 kg/(m^2).        Assessment & Plan:

## 2013-10-01 ENCOUNTER — Other Ambulatory Visit: Payer: Self-pay | Admitting: *Deleted

## 2013-10-01 MED ORDER — POLYETHYLENE GLYCOL 3350 17 GM/SCOOP PO POWD: 17.0000 g | Freq: Every day | ORAL | Status: DC | PRN

## 2013-10-06 ENCOUNTER — Encounter (HOSPITAL_COMMUNITY): Payer: Self-pay | Admitting: Emergency Medicine

## 2013-10-06 ENCOUNTER — Emergency Department (HOSPITAL_COMMUNITY)
Admission: EM | Admit: 2013-10-06 | Discharge: 2013-10-06 | Disposition: A | Discharge status: Home / Self Care | Payer: BC Managed Care – PPO | Attending: Emergency Medicine | Admitting: Emergency Medicine

## 2013-10-06 DIAGNOSIS — I5043 Acute on chronic combined systolic (congestive) and diastolic (congestive) heart failure: Secondary | ICD-10-CM | POA: Insufficient documentation

## 2013-10-06 DIAGNOSIS — J45901 Unspecified asthma with (acute) exacerbation: Secondary | ICD-10-CM | POA: Insufficient documentation

## 2013-10-06 DIAGNOSIS — R197 Diarrhea, unspecified: Secondary | ICD-10-CM | POA: Insufficient documentation

## 2013-10-06 DIAGNOSIS — IMO0002 Reserved for concepts with insufficient information to code with codable children: Secondary | ICD-10-CM | POA: Insufficient documentation

## 2013-10-06 DIAGNOSIS — F411 Generalized anxiety disorder: Secondary | ICD-10-CM | POA: Insufficient documentation

## 2013-10-06 DIAGNOSIS — G43809 Other migraine, not intractable, without status migrainosus: Secondary | ICD-10-CM | POA: Insufficient documentation

## 2013-10-06 DIAGNOSIS — R05 Cough: Secondary | ICD-10-CM

## 2013-10-06 DIAGNOSIS — R059 Cough, unspecified: Secondary | ICD-10-CM

## 2013-10-06 DIAGNOSIS — Z8619 Personal history of other infectious and parasitic diseases: Secondary | ICD-10-CM | POA: Insufficient documentation

## 2013-10-06 DIAGNOSIS — Z86718 Personal history of other venous thrombosis and embolism: Secondary | ICD-10-CM | POA: Insufficient documentation

## 2013-10-06 DIAGNOSIS — Z9889 Other specified postprocedural states: Secondary | ICD-10-CM | POA: Insufficient documentation

## 2013-10-06 DIAGNOSIS — Z95810 Presence of automatic (implantable) cardiac defibrillator: Secondary | ICD-10-CM | POA: Insufficient documentation

## 2013-10-06 DIAGNOSIS — Z862 Personal history of diseases of the blood and blood-forming organs and certain disorders involving the immune mechanism: Secondary | ICD-10-CM | POA: Insufficient documentation

## 2013-10-06 DIAGNOSIS — E039 Hypothyroidism, unspecified: Secondary | ICD-10-CM | POA: Insufficient documentation

## 2013-10-06 DIAGNOSIS — Z7982 Long term (current) use of aspirin: Secondary | ICD-10-CM | POA: Insufficient documentation

## 2013-10-06 DIAGNOSIS — Z8719 Personal history of other diseases of the digestive system: Secondary | ICD-10-CM | POA: Insufficient documentation

## 2013-10-06 DIAGNOSIS — G8929 Other chronic pain: Secondary | ICD-10-CM | POA: Insufficient documentation

## 2013-10-06 DIAGNOSIS — H9209 Otalgia, unspecified ear: Secondary | ICD-10-CM | POA: Insufficient documentation

## 2013-10-06 DIAGNOSIS — R52 Pain, unspecified: Secondary | ICD-10-CM | POA: Insufficient documentation

## 2013-10-06 DIAGNOSIS — I4891 Unspecified atrial fibrillation: Secondary | ICD-10-CM | POA: Insufficient documentation

## 2013-10-06 DIAGNOSIS — Z87891 Personal history of nicotine dependence: Secondary | ICD-10-CM | POA: Insufficient documentation

## 2013-10-06 DIAGNOSIS — Z79899 Other long term (current) drug therapy: Secondary | ICD-10-CM | POA: Insufficient documentation

## 2013-10-06 DIAGNOSIS — J029 Acute pharyngitis, unspecified: Secondary | ICD-10-CM | POA: Insufficient documentation

## 2013-10-06 DIAGNOSIS — Z872 Personal history of diseases of the skin and subcutaneous tissue: Secondary | ICD-10-CM | POA: Insufficient documentation

## 2013-10-06 MED ORDER — BENZONATATE 100 MG PO CAPS: 100.0000 mg | ORAL_CAPSULE | Freq: Three times a day (TID) | ORAL | Status: DC

## 2013-10-06 NOTE — ED Notes (Signed)
Pain across back x 2-3 days. Sore throat and cough started last night. No fevers.

## 2013-10-06 NOTE — ED Notes (Signed)
Pt c/o cough and sore throat x 3 days.  Pt has extensive cardiac hx and states that she has been in vfib, a fib, vtach, PVC, CHF.  States that her chest and back is burning.  EKG being done to r/o heart issues now.

## 2013-10-06 NOTE — ED Provider Notes (Signed)
CSN: [id number]     Arrival date & time 10/06/13  1130 History  This chart was scribed for Alpha Gula, PA-C, working with Suzi Roots, MD by Blanchard Kelch, ED Scribe. This patient was seen in room WTR6/WTR6 and the patient's care was started at 12:11 PM.     Chief Complaint  Patient presents with  . Cough  . Sore Throat    Patient is a 43 y.o. female presenting with cough and pharyngitis. The history is provided by the patient. No language interpreter was used.  Cough Associated symptoms: no chest pain and no shortness of breath   Sore Throat Pertinent negatives include no chest pain and no shortness of breath.    HPI Comments: [name] is a 43 y.o. female with a history of asthma who presents to the Emergency Department complaining of a constant sore throat that began last night. She describes the pain as burning. She has associated sinus pressure, ear pain, post nasal drip, dry cough and wheezing. She has been taking Coricidin cough/cold and using Ricola drops with mild relief. She also has been using her inhaler twice a day and taking a Zyrtec daily, which she states is her baseline usage. She has been unable to take decongestants for the symptoms due to her history of heart problems. She denies fever or above baseline chills She reports sick contacts at home with similar symptoms. She denies smoking. She has received her flu vaccination this season.  Past Medical History  Diagnosis Date  . Hypertrophic cardiomyopathy     s/p septal myomectomy with implantable defibrillator 01/27/2012   . Anxiety state, unspecified   . Allergic rhinitis     to dogs  . Asthma   . Anemia     during pregnancy  . History of chicken pox   . Generalized headaches   . Ocular migraine     no HA  . Back pain     told cracked bone, vicodin didn't help, improved with percocets/muscle relaxants, no eval by prior PCP  . 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 DVT (deep venous thrombosis) 01/2012    was placed on Warfarin after cardiac surgery but had skin reaction to warfarin so was changed to Pradaxa so DVT occurred while on Pradaxa although unclear if it occurred during transition  . Hypotension   . Atrial fibrillation     a. On pradaxa.  coumadin necrosis with warfarin;  b. Amio d/c'd 02/2012  . ICD (implantable cardiac defibrillator) in place     02/04/2012, [employer]  . Pericardial effusion   . Chronic combined systolic and diastolic CHF (congestive heart failure)     a. echo 03/19/12: severe asymmetric septal hypertrophy, evidence of upper septal myomectomy, peak LV outflow tract gradient 35, EF 35%,;  b.  Echo 9/13: Severe LVH, EF 30-35%, anteroseptal and inferior HK, LVOT narrow, mild AI, mild to moderate mitral stenosis, severe LAE  . Pericardial tamponade 03/14/2012  . 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 01/2012 s/p BSX ICD;  b. 06/2012 Multaq initiated due to recurrent syncope, VT/VF - had severe n/v with IV amio.  . Skin necrosis --COUMADIN   . Chronic chest wall pain     eval by CVTS, stable, rec return to [facility] if continued pain  . Syncope     a. in setting of VT/VF.  Marland Kitchen Hypothyroid   . Blood transfusion without  reported diagnosis   . Neuromuscular disorder   . Gall stones    Past Surgical History  Procedure Laterality Date  . Cesarean section  1991  . Induced abortion  1990 and 1993  . Cardiac surgery  01/27/2012    median sternotomy, septal myectomy, MVR - [facility] (Dr. Silvestre MesiGlower)  . Cardiac defibrillator placement  01/2012  . Myomectomy    . Pericardial window  03/14/2012    Procedure: PERICARDIAL WINDOW;  Surgeon: Purcell Nailslarence H Owen, MD;  [facility];  Service: Open Heart Surgery;  Laterality: N/A;  Subxyphoid Pericardial  Window    Family History  Problem Relation Age of Onset  . Diabetes Father   . Heart disease Father     unspecified heart problem  . Thyroid disease Mother   .  Anemia Mother     mediterranean anemia, ITP  . Heart disease Maternal Grandfather     CHF  . Diabetes Paternal Grandfather   . Cancer Paternal Grandfather   . Coronary artery disease Neg Hx   . Stroke Neg Hx   . Sudden death Neg Hx    History  Substance Use Topics  . Smoking status: Former Smoker -- 1.00 packs/day for 3 years    Types: Cigarettes    Quit date: 09/30/1988  . Smokeless tobacco: Never Used     Comment: quitin 1990  . Alcohol Use: No     Comment: none since "months" as of 02/28/2013   OB History   Grav Para Term Preterm Abortions TAB SAB Ect Mult Living                 Review of Systems  Respiratory: Negative for shortness of breath.   Cardiovascular: Negative for chest pain.  Gastrointestinal: Positive for diarrhea. Negative for nausea and vomiting.  All other systems reviewed and are negative.    Allergies  Phenergan; Amiodarone; Nitroglycerin; and Warfarin and related  Home Medications   Current Outpatient Rx  Name  Route  Sig  Dispense  Refill  . acetaminophen (TYLENOL) 500 MG tablet   Oral   Take 500 mg by mouth 3 (three) times daily as needed for pain.         Marland Kitchen. ALPRAZolam (XANAX) 1 MG tablet   Oral   Take 1 tablet (1 mg total) by mouth 3 (three) times daily as needed for anxiety.   60 tablet   1   . aspirin EC 81 MG tablet   Oral   Take 81 mg by mouth daily.         . beclomethasone (QVAR) 80 MCG/ACT inhaler   Inhalation   Inhale 1 puff into the lungs 2 (two) times daily.         . cetirizine (ZYRTEC) 10 MG tablet   Oral   Take 10 mg by mouth every morning.          . Chlorpheniramine-DM (CORICIDIN HBP COUGH/COLD PO)   Oral   Take 1 tablet by mouth every 4 (four) hours as needed (cold symptoms).         . furosemide (LASIX) 20 MG tablet   Oral   Take 1 tablet (20 mg total) by mouth every other day.   30 tablet      . gabapentin (NEURONTIN) 300 MG capsule   Oral   Take 1 capsule (300 mg total) by mouth 3 (three)  times daily.   90 capsule   6   . levothyroxine (SYNTHROID, LEVOTHROID) 50 MCG tablet  Oral   Take 50 mcg by mouth daily before breakfast.          . magnesium gluconate (MAGONATE) 500 MG tablet   Oral   Take 250 mg by mouth 2 (two) times daily.         . metoprolol succinate (TOPROL-XL) 25 MG 24 hr tablet   Oral   Take 12.5 mg by mouth daily.          Marland Kitchen mexiletine (MEXITIL) 150 MG capsule   Oral   Take 300 mg by mouth 3 (three) times daily.          Marland Kitchen oxyCODONE-acetaminophen (PERCOCET/ROXICET) 5-325 MG per tablet   Oral   Take 1 tablet by mouth 3 (three) times daily as needed.   15 tablet   0   . Potassium Gluconate 595 MG CAPS   Oral   Take 595 mg by mouth daily.         . ranolazine (RANEXA) 1000 MG SR tablet   Oral   Take 1,000 mg by mouth 2 (two) times daily.          . benzonatate (TESSALON) 100 MG capsule   Oral   Take 1 capsule (100 mg total) by mouth every 8 (eight) hours.   21 capsule   0    Triage Vitals: BP 111/59  Pulse 70  Temp(Src) 98.5 F (36.9 C) (Oral)  Resp 16  SpO2 99%  LMP 09/08/2013  Physical Exam  Nursing note and vitals reviewed. Constitutional: She is oriented to person, place, and time. She appears well-developed and well-nourished. No distress.  HENT:  Head: Normocephalic and atraumatic.  Right Ear: Tympanic membrane, external ear and ear canal normal.  Left Ear: Tympanic membrane, external ear and ear canal normal.  Nose: Nose normal.  Mouth/Throat: Oropharynx is clear and moist. No oropharyngeal exudate, posterior oropharyngeal edema or posterior oropharyngeal erythema.  Eyes: EOM are normal.  Neck: Neck supple. No tracheal deviation present.  Cardiovascular: Normal rate and regular rhythm.   Pulmonary/Chest: Effort normal and breath sounds normal. No respiratory distress. She has no wheezes. She has no rhonchi. She has no rales.  Musculoskeletal: Normal range of motion.  Neurological: She is alert and oriented  to person, place, and time.  Skin: Skin is warm and dry.  Psychiatric: She has a normal mood and affect. Her behavior is normal.    ED Course  Procedures (including critical care time)  DIAGNOSTIC STUDIES: Oxygen Saturation is 99% on room air, normal by my interpretation.    COORDINATION OF CARE: 12:14 PM -Recommend OTC anti histamines for symptomatic relief. Advise patient to return if fever or increased shortness of breath develop. Patient verbalizes understanding and agrees with treatment plan.    Labs Review Labs Reviewed - No data to display Imaging Review No results found.  EKG Interpretation    Date/Time:  Wednesday October 06 2013 11:39:03 EST Ventricular Rate:  75 PR Interval:  227 QRS Duration: 176 QT Interval:  458 QTC Calculation: 512 R Axis:   -41 Text Interpretation:  Atrial-paced rhythm Left bundle branch block No significant change since Confirmed by KOHUT  MD, STEPHEN ([id number]) on 10/06/2013 11:48:58 AM            MDM   1. Cough   2. Body aches    EKG unchanged since previous. Suspect viral URI given hx / physical.   Follow up with PCP in 2 days if your symptoms should worsen. Return to the ED if  you should develop progressive shortness of breath or chest pain or fever/chills that do not resolve with tylenol/motrin. Drink plenty of fluids. Warm tea and honey may help with cough. Patient agrees with plan. Discharged in good condition.   Meds given in ED:  Medications - No data to display  Discharge Medication List as of 10/06/2013 12:25 PM    START taking these medications   Details  benzonatate (TESSALON) 100 MG capsule Take 1 capsule (100 mg total) by mouth every 8 (eight) hours., Starting 10/06/2013, Until Discontinued, Print         I personally performed the services described in this documentation, which was scribed in my presence. The recorded information has been reviewed and is accurate.    Rudene Anda, PA-C 10/07/13 [id number]

## 2013-10-06 NOTE — Discharge Instructions (Signed)
Follow up with PCP in 2 days if your symptoms should worsen. Return to the ED if you should develop progressive shortness of breath or chest pain or fever/chills that do not resolve with tylenol/motrin. Drink plenty of fluids. Warm tea and honey may help with cough.    Cough, Adult  A cough is a reflex that helps clear your throat and airways. It can help heal the body or may be a reaction to an irritated airway. A cough may only last 2 or 3 weeks (acute) or may last more than 8 weeks (chronic).  CAUSES Acute cough:  Viral or bacterial infections. Chronic cough:  Infections.  Allergies.  Asthma.  Post-nasal drip.  Smoking.  Heartburn or acid reflux.  Some medicines.  Chronic lung problems (COPD).  Cancer. SYMPTOMS   Cough.  Fever.  Chest pain.  Increased breathing rate.  High-pitched whistling sound when breathing (wheezing).  Colored mucus that you cough up (sputum). TREATMENT   A bacterial cough may be treated with antibiotic medicine.  A viral cough must run its course and will not respond to antibiotics.  Your caregiver may recommend other treatments if you have a chronic cough. HOME CARE INSTRUCTIONS   Only take over-the-counter or prescription medicines for pain, discomfort, or fever as directed by your caregiver. Use cough suppressants only as directed by your caregiver.  Use a cold steam vaporizer or humidifier in your bedroom or home to help loosen secretions.  Sleep in a semi-upright position if your cough is worse at night.  Rest as needed.  Stop smoking if you smoke. SEEK IMMEDIATE MEDICAL CARE IF:   You have pus in your sputum.  Your cough starts to worsen.  You cannot control your cough with suppressants and are losing sleep.  You begin coughing up blood.  You have difficulty breathing.  You develop pain which is getting worse or is uncontrolled with medicine.  You have a fever. MAKE SURE YOU:   Understand these  instructions.  Will watch your condition.  Will get help right away if you are not doing well or get worse. Document Released: 03/15/2011 Document Revised: 12/09/2011 Document Reviewed: 03/15/2011 [facility] Patient Information 2014 [employer] [place], [place].

## 2013-10-07 ENCOUNTER — Telehealth: Payer: Self-pay | Admitting: Internal Medicine

## 2013-10-07 ENCOUNTER — Ambulatory Visit: Payer: BC Managed Care – PPO | Admitting: Internal Medicine

## 2013-10-07 ENCOUNTER — Encounter: Payer: BC Managed Care – PPO | Admitting: Neurology

## 2013-10-07 NOTE — Telephone Encounter (Signed)
No charge. 

## 2013-10-07 NOTE — Telephone Encounter (Signed)
No charge per Dr. Pyrtle °

## 2013-10-08 NOTE — ED Provider Notes (Signed)
Medical screening examination/treatment/procedure(s) were performed by non-physician practitioner and as supervising physician I was immediately available for consultation/collaboration.  EKG Interpretation    Date/Time:  Wednesday October 06 2013 11:39:03 EST Ventricular Rate:  75 PR Interval:  227 QRS Duration: 176 QT Interval:  458 QTC Calculation: 512 R Axis:   -41 Text Interpretation:  Atrial-paced rhythm Left bundle branch block No significant change since Confirmed by Juleen ChinaKOHUT  MD, STEPHEN ([id number]) on 10/06/2013 11:48:58 AM              Suzi RootsKevin E Fahd Galea, MD 10/08/13 [phone]

## 2013-10-10 ENCOUNTER — Emergency Department (HOSPITAL_COMMUNITY): Payer: BC Managed Care – PPO

## 2013-10-10 ENCOUNTER — Emergency Department (HOSPITAL_COMMUNITY)
Admission: EM | Admit: 2013-10-10 | Discharge: 2013-10-10 | Disposition: A | Discharge status: Home / Self Care | Payer: BC Managed Care – PPO | Attending: Emergency Medicine | Admitting: Emergency Medicine

## 2013-10-10 ENCOUNTER — Encounter (HOSPITAL_COMMUNITY): Payer: Self-pay | Admitting: Emergency Medicine

## 2013-10-10 DIAGNOSIS — G43809 Other migraine, not intractable, without status migrainosus: Secondary | ICD-10-CM | POA: Insufficient documentation

## 2013-10-10 DIAGNOSIS — J069 Acute upper respiratory infection, unspecified: Secondary | ICD-10-CM | POA: Insufficient documentation

## 2013-10-10 DIAGNOSIS — J45901 Unspecified asthma with (acute) exacerbation: Secondary | ICD-10-CM | POA: Insufficient documentation

## 2013-10-10 DIAGNOSIS — Z79899 Other long term (current) drug therapy: Secondary | ICD-10-CM | POA: Insufficient documentation

## 2013-10-10 DIAGNOSIS — I252 Old myocardial infarction: Secondary | ICD-10-CM | POA: Insufficient documentation

## 2013-10-10 DIAGNOSIS — Z86718 Personal history of other venous thrombosis and embolism: Secondary | ICD-10-CM | POA: Insufficient documentation

## 2013-10-10 DIAGNOSIS — Z8719 Personal history of other diseases of the digestive system: Secondary | ICD-10-CM | POA: Insufficient documentation

## 2013-10-10 DIAGNOSIS — F411 Generalized anxiety disorder: Secondary | ICD-10-CM | POA: Insufficient documentation

## 2013-10-10 DIAGNOSIS — IMO0002 Reserved for concepts with insufficient information to code with codable children: Secondary | ICD-10-CM | POA: Insufficient documentation

## 2013-10-10 DIAGNOSIS — R05 Cough: Secondary | ICD-10-CM

## 2013-10-10 DIAGNOSIS — I5042 Chronic combined systolic (congestive) and diastolic (congestive) heart failure: Secondary | ICD-10-CM | POA: Insufficient documentation

## 2013-10-10 DIAGNOSIS — G8929 Other chronic pain: Secondary | ICD-10-CM | POA: Insufficient documentation

## 2013-10-10 DIAGNOSIS — Z95810 Presence of automatic (implantable) cardiac defibrillator: Secondary | ICD-10-CM | POA: Insufficient documentation

## 2013-10-10 DIAGNOSIS — Z7982 Long term (current) use of aspirin: Secondary | ICD-10-CM | POA: Insufficient documentation

## 2013-10-10 DIAGNOSIS — Z872 Personal history of diseases of the skin and subcutaneous tissue: Secondary | ICD-10-CM | POA: Insufficient documentation

## 2013-10-10 DIAGNOSIS — R0789 Other chest pain: Secondary | ICD-10-CM | POA: Insufficient documentation

## 2013-10-10 DIAGNOSIS — R059 Cough, unspecified: Secondary | ICD-10-CM

## 2013-10-10 DIAGNOSIS — E039 Hypothyroidism, unspecified: Secondary | ICD-10-CM | POA: Insufficient documentation

## 2013-10-10 DIAGNOSIS — Z8619 Personal history of other infectious and parasitic diseases: Secondary | ICD-10-CM | POA: Insufficient documentation

## 2013-10-10 DIAGNOSIS — Z87891 Personal history of nicotine dependence: Secondary | ICD-10-CM | POA: Insufficient documentation

## 2013-10-10 LAB — CBC WITH DIFFERENTIAL/PLATELET
Basophils Absolute: 0 10*3/uL (ref 0.0–0.1)
Basophils Relative: 1 % (ref 0–1)
Eosinophils Absolute: 0.2 10*3/uL (ref 0.0–0.7)
Eosinophils Relative: 4 % (ref 0–5)
HEMATOCRIT: 42.6 % (ref 36.0–46.0)
HEMOGLOBIN: 13.9 g/dL (ref 12.0–15.0)
LYMPHS ABS: 1 10*3/uL (ref 0.7–4.0)
Lymphocytes Relative: 23 % (ref 12–46)
MCH: 27.4 pg (ref 26.0–34.0)
MCHC: 32.6 g/dL (ref 30.0–36.0)
MCV: 84 fL (ref 78.0–100.0)
MONOS PCT: 12 % (ref 3–12)
Monocytes Absolute: 0.5 10*3/uL (ref 0.1–1.0)
NEUTROS ABS: 2.6 10*3/uL (ref 1.7–7.7)
Neutrophils Relative %: 61 % (ref 43–77)
Platelets: 276 10*3/uL (ref 150–400)
RBC: 5.07 MIL/uL (ref 3.87–5.11)
RDW: 14.4 % (ref 11.5–15.5)
WBC: 4.2 10*3/uL (ref 4.0–10.5)

## 2013-10-10 LAB — COMPREHENSIVE METABOLIC PANEL
ALT: 33 U/L (ref 0–35)
AST: 32 U/L (ref 0–37)
Albumin: 3.9 g/dL (ref 3.5–5.2)
Alkaline Phosphatase: 74 U/L (ref 39–117)
BILIRUBIN TOTAL: 0.3 mg/dL (ref 0.3–1.2)
BUN: 9 mg/dL (ref 6–23)
CO2: 26 meq/L (ref 19–32)
CREATININE: 0.69 mg/dL (ref 0.50–1.10)
Calcium: 9 mg/dL (ref 8.4–10.5)
Chloride: 100 mEq/L (ref 96–112)
GFR calc Af Amer: >90 mL/min (ref >90)
Glucose, Bld: 135 mg/dL — ABNORMAL HIGH (ref 70–99)
Potassium: 3.7 mEq/L (ref 3.7–5.3)
Sodium: 138 mEq/L (ref 137–147)
Total Protein: 8.3 g/dL (ref 6.0–8.3)

## 2013-10-10 LAB — POCT I-STAT TROPONIN I: TROPONIN I, POC: 0.04 ng/mL (ref 0.00–0.08)

## 2013-10-10 LAB — PRO B NATRIURETIC PEPTIDE: Pro B Natriuretic peptide (BNP): 523 pg/mL — ABNORMAL HIGH (ref 0–125)

## 2013-10-10 MED ORDER — HYDROCOD POLST-CHLORPHEN POLST 10-8 MG/5ML PO LQCR: 5.0000 mL | Freq: Two times a day (BID) | ORAL | Status: DC | PRN

## 2013-10-10 MED ORDER — ALBUTEROL SULFATE (2.5 MG/3ML) 0.083% IN NEBU
5.0000 mg | INHALATION_SOLUTION | Freq: Once | RESPIRATORY_TRACT | Status: AC
  Administered 2013-10-10: 5 mg via RESPIRATORY_TRACT
  Filled 2013-10-10: qty 6

## 2013-10-10 MED ORDER — IPRATROPIUM BROMIDE 0.02 % IN SOLN
0.5000 mg | Freq: Once | RESPIRATORY_TRACT | Status: AC
  Administered 2013-10-10: 0.5 mg via RESPIRATORY_TRACT
  Filled 2013-10-10: qty 2.5

## 2013-10-10 MED ORDER — PREDNISONE 20 MG PO TABS: 40.0000 mg | ORAL_TABLET | Freq: Every day | ORAL | Status: DC

## 2013-10-10 NOTE — ED Notes (Signed)
Pt c/o cough x 1 wk.  States that she was here for this previously but it has gotten worse.

## 2013-10-10 NOTE — ED Provider Notes (Signed)
CSN: [id number]     Arrival date & time 10/10/13  1022 History   First MD Initiated Contact with Patient 10/10/13 1026     Chief Complaint  Patient presents with  . Cough   (Consider location/radiation/quality/duration/timing/severity/associated sxs/prior Treatment) HPI Comments: Patient is a 43 year old female with history of hypertrophic cardiomyopathy, asthma, atrial fibrillation, DVT, congestive heart failure, hypothyroidism who presents today with one week of gradually worsening cough. The cough is sometimes productive. It is worse at night. She has taken Coricidin HBP and tessalon pearles with no relief of her symptoms. She also has sinus congestion and pressure. She has postnasal drip. She reports that she is taking Percocet with no relief of this pain. She reports chest tightness and pain which she feels is related to her cough and being ill. No fevers, chills, nausea, vomiting, abdominal pain.   The history is provided by the patient. No language interpreter was used.    Past Medical History  Diagnosis Date  . Hypertrophic cardiomyopathy     s/p septal myomectomy with implantable defibrillator 01/27/2012   . Anxiety state, unspecified   . Allergic rhinitis     to dogs  . Asthma   . Anemia     during pregnancy  . History of chicken pox   . Generalized headaches   . Ocular migraine     no HA  . Back pain     told cracked bone, vicodin didn't help, improved with percocets/muscle relaxants, no eval by prior PCP  . 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 DVT (deep venous thrombosis) 01/2012    was placed on Warfarin after cardiac surgery but had skin reaction to warfarin so was changed to Pradaxa so DVT occurred while on Pradaxa although unclear if it occurred during transition  . Hypotension   . Atrial fibrillation     a. On pradaxa.  coumadin necrosis with warfarin;  b. Amio d/c'd 02/2012  . ICD (implantable cardiac  defibrillator) in place     02/04/2012, [employer]  . Pericardial effusion   . Chronic combined systolic and diastolic CHF (congestive heart failure)     a. echo 03/19/12: severe asymmetric septal hypertrophy, evidence of upper septal myomectomy, peak LV outflow tract gradient 35, EF 35%,;  b.  Echo 9/13: Severe LVH, EF 30-35%, anteroseptal and inferior HK, LVOT narrow, mild AI, mild to moderate mitral stenosis, severe LAE  . Pericardial tamponade 03/14/2012  . 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 01/2012 s/p BSX ICD;  b. 06/2012 Multaq initiated due to recurrent syncope, VT/VF - had severe n/v with IV amio.  . Skin necrosis --COUMADIN   . Chronic chest wall pain     eval by CVTS, stable, rec return to [facility] if continued pain  . Syncope     a. in setting of VT/VF.  Marland Kitchen Hypothyroid   . Blood transfusion without reported diagnosis   . Neuromuscular disorder   . Gall stones    Past Surgical History  Procedure Laterality Date  . Cesarean section  1991  . Induced abortion  1990 and 1993  . Cardiac surgery  01/27/2012    median sternotomy, septal myectomy, MVR - [facility] (Dr. Silvestre Mesi)  . Cardiac defibrillator placement  01/2012  . Myomectomy    . Pericardial window  03/14/2012    Procedure: PERICARDIAL WINDOW;  Surgeon: Purcell Nails, MD;  Location: [facility] OR;  Service: Open Heart Surgery;  Laterality: N/A;  Subxyphoid Pericardial  Window    Family History  Problem Relation Age of Onset  . Diabetes Father   . Heart disease Father     unspecified heart problem  . Thyroid disease Mother   . Anemia Mother     mediterranean anemia, ITP  . Heart disease Maternal Grandfather     CHF  . Diabetes Paternal Grandfather   . Cancer Paternal Grandfather   . Coronary artery disease Neg Hx   . Stroke Neg Hx   . Sudden death Neg Hx    History  Substance Use Topics  . Smoking status: Former Smoker -- 1.00 packs/day for 3 years    Types: Cigarettes    Quit date: 09/30/1988  . Smokeless tobacco:  Never Used     Comment: quitin 1990  . Alcohol Use: No     Comment: none since "months" as of 02/28/2013   OB History   Grav Para Term Preterm Abortions TAB SAB Ect Mult Living                 Review of Systems  Constitutional: Negative for fever and chills.  HENT: Positive for congestion, ear pain, postnasal drip, rhinorrhea, sinus pressure and sore throat.   Respiratory: Positive for cough, chest tightness and shortness of breath.   Cardiovascular: Positive for chest pain.  Gastrointestinal: Negative for nausea, vomiting and abdominal pain.  All other systems reviewed and are negative.    Allergies  Phenergan; Amiodarone; Nitroglycerin; and Warfarin and related  Home Medications   Current Outpatient Rx  Name  Route  Sig  Dispense  Refill  . acetaminophen (TYLENOL) 500 MG tablet   Oral   Take 500 mg by mouth 3 (three) times daily as needed for pain.         Marland Kitchen. ALPRAZolam (XANAX) 1 MG tablet   Oral   Take 1 tablet (1 mg total) by mouth 3 (three) times daily as needed for anxiety.   60 tablet   1   . aspirin EC 81 MG tablet   Oral   Take 81 mg by mouth daily.         . beclomethasone (QVAR) 80 MCG/ACT inhaler   Inhalation   Inhale 1 puff into the lungs 2 (two) times daily.         . benzonatate (TESSALON) 100 MG capsule   Oral   Take 1 capsule (100 mg total) by mouth every 8 (eight) hours.   21 capsule   0   . cetirizine (ZYRTEC) 10 MG tablet   Oral   Take 10 mg by mouth every morning.          . Chlorpheniramine-DM (CORICIDIN HBP COUGH/COLD PO)   Oral   Take 1 tablet by mouth every 4 (four) hours as needed (cold symptoms).         . furosemide (LASIX) 20 MG tablet   Oral   Take 1 tablet (20 mg total) by mouth every other day.   30 tablet      . gabapentin (NEURONTIN) 300 MG capsule   Oral   Take 1 capsule (300 mg total) by mouth 3 (three) times daily.   90 capsule   6   . levothyroxine (SYNTHROID, LEVOTHROID) 50 MCG tablet   Oral   Take  50 mcg by mouth daily before breakfast.          . magnesium gluconate (MAGONATE) 500 MG tablet   Oral   Take 250  mg by mouth 2 (two) times daily.         . metoprolol succinate (TOPROL-XL) 25 MG 24 hr tablet   Oral   Take 12.5 mg by mouth daily.          Marland Kitchen mexiletine (MEXITIL) 150 MG capsule   Oral   Take 300 mg by mouth 3 (three) times daily.          Marland Kitchen oxyCODONE-acetaminophen (PERCOCET/ROXICET) 5-325 MG per tablet   Oral   Take 1 tablet by mouth 3 (three) times daily as needed.   15 tablet   0   . Potassium Gluconate 595 MG CAPS   Oral   Take 595 mg by mouth daily.         . ranolazine (RANEXA) 1000 MG SR tablet   Oral   Take 1,000 mg by mouth 2 (two) times daily.           BP 108/80  Pulse 95  Temp(Src) 98.5 F (36.9 C) (Oral)  Resp 16  SpO2 97%  LMP 09/08/2013 Physical Exam  Nursing note and vitals reviewed. Constitutional: She is oriented to person, place, and time. She appears well-developed and well-nourished. No distress.  HENT:  Head: Normocephalic and atraumatic.  Right Ear: Tympanic membrane, external ear and ear canal normal.  Left Ear: Tympanic membrane, external ear and ear canal normal.  Nose: Right sinus exhibits frontal sinus tenderness. Right sinus exhibits no maxillary sinus tenderness. Left sinus exhibits frontal sinus tenderness. Left sinus exhibits no maxillary sinus tenderness.  Mouth/Throat: Uvula is midline, oropharynx is clear and moist and mucous membranes are normal.  Eyes: Conjunctivae are normal.  Neck: Normal range of motion.  No nuchal rigidity or meningeal signs  Cardiovascular: Normal rate, regular rhythm, normal heart sounds, intact distal pulses and normal pulses.   Pulmonary/Chest: Effort normal. No stridor. No respiratory distress. She has wheezes (faint). She has no rales.  Abdominal: Soft. She exhibits no distension.  Musculoskeletal: Normal range of motion.  Neurological: She is alert and oriented to person,  place, and time. She has normal strength.  Skin: Skin is warm and dry. She is not diaphoretic. No erythema.  Psychiatric: She has a normal mood and affect. Her behavior is normal.    ED Course  Procedures (including critical care time) Labs Review Labs Reviewed  COMPREHENSIVE METABOLIC PANEL - Abnormal; Notable for the following:    Glucose, Bld 135 (*)    All other components within normal limits  PRO B NATRIURETIC PEPTIDE - Abnormal; Notable for the following:    Pro B Natriuretic peptide (BNP) 523.0 (*)    All other components within normal limits  CBC WITH DIFFERENTIAL  POCT I-STAT TROPONIN I   Imaging Review Dg Chest 2 View  10/10/2013   CLINICAL DATA:  Cough  EXAM: CHEST  2 VIEW  COMPARISON:  09/13/2013  FINDINGS: Cardiomegaly again noted. Three leads cardiac pacemaker is unchanged in position. Status post median sternotomy. No acute infiltrate or pleural effusion. No pulmonary edema. Bony thorax is stable.  IMPRESSION: No active cardiopulmonary disease.   Electronically Signed   By: Natasha Mead M.D.   On: 10/10/2013 11:47    EKG Interpretation   None       MDM   1. URI (upper respiratory infection)   2. Cough    Pt CXR negative for acute infiltrate. Patients symptoms are consistent with URI, likely viral etiology. Discussed that antibiotics are not indicated for viral infections. Pt will be discharged  with symptomatic treatment.  Verbalizes understanding and is agreeable with plan. Pt is hemodynamically stable & in NAD prior to dc. Patient was worked up for ACS and CHF exacerbation given her history and associated chest pain and shortness of breath. Her lab work and EKG are unremarkable. No concern that patient's sx are related to this at this time. Patient was given strict return instructions. Patient / Family / Caregiver informed of clinical course, understand medical decision-making process, and agree with plan.     Mora Bellman, PA-C 10/11/13 [phone]

## 2013-10-10 NOTE — Discharge Instructions (Signed)
Cough, Adult  A cough is a reflex. It helps you clear your throat and airways. A cough can help heal your body. A cough can last 2 or 3 weeks (acute) or may last more than 8 weeks (chronic). Some common causes of a cough can include an infection, allergy, or a cold. HOME CARE  Only take medicine as told by your doctor.  If given, take your medicines (antibiotics) as told. Finish them even if you start to feel better.  Use a cold steam vaporizer or humidier in your home. This can help loosen thick spit (secretions).  Sleep so you are almost sitting up (semi-upright). Use pillows to do this. This helps reduce coughing.  Rest as needed.  Stop smoking if you smoke. GET HELP RIGHT AWAY IF:  You have yellowish-white fluid (pus) in your thick spit.  Your cough gets worse.  Your medicine does not reduce coughing, and you are losing sleep.  You cough up blood.  You have trouble breathing.  Your pain gets worse and medicine does not help.  You have a fever. MAKE SURE YOU:   Understand these instructions.  Will watch your condition.  Will get help right away if you are not doing well or get worse. Document Released: 05/30/2011 Document Revised: 12/09/2011 Document Reviewed: 05/30/2011 [facility] Patient Information 2014 [place].  Upper Respiratory Infection, Adult An upper respiratory infection (URI) is also sometimes known as the common cold. The upper respiratory tract includes the nose, sinuses, throat, trachea, and bronchi. Bronchi are the airways leading to the lungs. Most people improve within 1 week, but symptoms can last up to 2 weeks. A residual cough may last even longer.  CAUSES Many different viruses can infect the tissues lining the upper respiratory tract. The tissues become irritated and inflamed and often become very moist. Mucus production is also common. A cold is contagious. You can easily spread the virus to others by oral contact. This includes kissing,  sharing a glass, coughing, or sneezing. Touching your mouth or nose and then touching a surface, which is then touched by another person, can also spread the virus. SYMPTOMS  Symptoms typically develop 1 to 3 days after you come in contact with a cold virus. Symptoms vary from person to person. They may include:  Runny nose.  Sneezing.  Nasal congestion.  Sinus irritation.  Sore throat.  Loss of voice (laryngitis).  Cough.  Fatigue.  Muscle aches.  Loss of appetite.  Headache.  Low-grade fever. DIAGNOSIS  You might diagnose your own cold based on familiar symptoms, since most people get a cold 2 to 3 times a year. Your caregiver can confirm this based on your exam. Most importantly, your caregiver can check that your symptoms are not due to another disease such as strep throat, sinusitis, pneumonia, asthma, or epiglottitis. Blood tests, throat tests, and X-rays are not necessary to diagnose a common cold, but they may sometimes be helpful in excluding other more serious diseases. Your caregiver will decide if any further tests are required. RISKS AND COMPLICATIONS  You may be at risk for a more severe case of the common cold if you smoke cigarettes, have chronic heart disease (such as heart failure) or lung disease (such as asthma), or if you have a weakened immune system. The very young and very old are also at risk for more serious infections. Bacterial sinusitis, middle ear infections, and bacterial pneumonia can complicate the common cold. The common cold can worsen asthma and chronic obstructive  pulmonary disease (COPD). Sometimes, these complications can require emergency medical care and may be life-threatening. PREVENTION  The best way to protect against getting a cold is to practice good hygiene. Avoid oral or hand contact with people with cold symptoms. Wash your hands often if contact occurs. There is no clear evidence that vitamin C, vitamin E, echinacea, or exercise  reduces the chance of developing a cold. However, it is always recommended to get plenty of rest and practice good nutrition. TREATMENT  Treatment is directed at relieving symptoms. There is no cure. Antibiotics are not effective, because the infection is caused by a virus, not by bacteria. Treatment may include:  Increased fluid intake. Sports drinks offer valuable electrolytes, sugars, and fluids.  Breathing heated mist or steam (vaporizer or shower).  Eating chicken soup or other clear broths, and maintaining good nutrition.  Getting plenty of rest.  Using gargles or lozenges for comfort.  Controlling fevers with ibuprofen or acetaminophen as directed by your caregiver.  Increasing usage of your inhaler if you have asthma. Zinc gel and zinc lozenges, taken in the first 24 hours of the common cold, can shorten the duration and lessen the severity of symptoms. Pain medicines may help with fever, muscle aches, and throat pain. A variety of non-prescription medicines are available to treat congestion and runny nose. Your caregiver can make recommendations and may suggest nasal or lung inhalers for other symptoms.  HOME CARE INSTRUCTIONS   Only take over-the-counter or prescription medicines for pain, discomfort, or fever as directed by your caregiver.  Use a warm mist humidifier or inhale steam from a shower to increase air moisture. This may keep secretions moist and make it easier to breathe.  Drink enough water and fluids to keep your urine clear or pale yellow.  Rest as needed.  Return to work when your temperature has returned to normal or as your caregiver advises. You may need to stay home longer to avoid infecting others. You can also use a face mask and careful hand washing to prevent spread of the virus. SEEK MEDICAL CARE IF:   After the first few days, you feel you are getting worse rather than better.  You need your caregiver's advice about medicines to control  symptoms.  You develop chills, worsening shortness of breath, or brown or red sputum. These may be signs of pneumonia.  You develop yellow or brown nasal discharge or pain in the face, especially when you bend forward. These may be signs of sinusitis.  You develop a fever, swollen neck glands, pain with swallowing, or white areas in the back of your throat. These may be signs of strep throat. SEEK IMMEDIATE MEDICAL CARE IF:   You have a fever.  You develop severe or persistent headache, ear pain, sinus pain, or chest pain.  You develop wheezing, a prolonged cough, cough up blood, or have a change in your usual mucus (if you have chronic lung disease).  You develop sore muscles or a stiff neck. Document Released: 03/12/2001 Document Revised: 12/09/2011 Document Reviewed: 01/18/2011 [facility] Patient Information 2014 [place].

## 2013-10-11 ENCOUNTER — Ambulatory Visit: Payer: BC Managed Care – PPO | Admitting: Neurology

## 2013-10-12 ENCOUNTER — Ambulatory Visit (INDEPENDENT_AMBULATORY_CARE_PROVIDER_SITE_OTHER): Payer: BC Managed Care – PPO | Admitting: Internal Medicine

## 2013-10-12 ENCOUNTER — Encounter: Payer: Self-pay | Admitting: Internal Medicine

## 2013-10-12 VITALS — BP 118/72 | HR 90 | Ht 60.0 in | Wt 208.0 lb

## 2013-10-12 DIAGNOSIS — Z95810 Presence of automatic (implantable) cardiac defibrillator: Secondary | ICD-10-CM

## 2013-10-12 DIAGNOSIS — I472 Ventricular tachycardia, unspecified: Secondary | ICD-10-CM

## 2013-10-12 DIAGNOSIS — I4729 Other ventricular tachycardia: Secondary | ICD-10-CM

## 2013-10-12 DIAGNOSIS — I509 Heart failure, unspecified: Secondary | ICD-10-CM

## 2013-10-12 DIAGNOSIS — I5042 Chronic combined systolic (congestive) and diastolic (congestive) heart failure: Secondary | ICD-10-CM

## 2013-10-12 LAB — MDC_IDC_ENUM_SESS_TYPE_INCLINIC
Date Time Interrogation Session: 20150113050000
HIGH POWER IMPEDANCE MEASURED VALUE: 36 Ohm
HighPow Impedance: 55 Ohm
Implantable Pulse Generator Serial Number: 102591
Lead Channel Impedance Value: 517 Ohm
Lead Channel Pacing Threshold Amplitude: 1.1 V
Lead Channel Pacing Threshold Pulse Width: 0.5 ms
Lead Channel Pacing Threshold Pulse Width: 0.5 ms
Lead Channel Sensing Intrinsic Amplitude: 16.8 mV
Lead Channel Setting Pacing Amplitude: 2 V
Lead Channel Setting Pacing Amplitude: 2.4 V
Lead Channel Setting Pacing Pulse Width: 0.5 ms
MDC IDC MSMT LEADCHNL RA IMPEDANCE VALUE: 633 Ohm
MDC IDC MSMT LEADCHNL RA PACING THRESHOLD AMPLITUDE: 0.8 V
MDC IDC MSMT LEADCHNL RA SENSING INTR AMPL: 7.4 mV
MDC IDC SET LEADCHNL RV SENSING SENSITIVITY: 0.6 mV
MDC IDC SET ZONE DETECTION INTERVAL: 353 ms
MDC IDC STAT BRADY RA PERCENT PACED: 60 %
MDC IDC STAT BRADY RV PERCENT PACED: 1 %
Zone Setting Detection Interval: 316 ms

## 2013-10-12 MED ORDER — MEXILETINE HCL 150 MG PO CAPS: 300.0000 mg | ORAL_CAPSULE | Freq: Three times a day (TID) | ORAL | Status: DC

## 2013-10-12 NOTE — Progress Notes (Signed)
HPI [name] returns today for followup. She is a very pleasant 43 year old woman with a history of hypertrophic cardiomyopathy status post myomectomy, with recurrent ventricular tachycardia and fibrillation, status post ICD implantation. Because of recurrent ventricular tachycardia, she was transferred to [facility] several months ago where she underwent unsuccessful ablation of ventricular tachycardia. She was then treated with multiple antiarrhythmic drugs, ultimately being discharged on a combination of mexiletine, and Ranexa. In the interim, she has experienced palpitations, but no ICD shocks. Interrogation of her device demonstrates a 1-1 tachycardia which is most consistent with supraventricular tachycardia. Interrogation of her strips demonstrates initiation with a PAC with PR prolongation. The patient has had trouble with gallstones, and was considered for surgery but judged to be too high risk. Her gallbladder symptoms are mostly recently controlled. Allergies  Allergen Reactions  . Phenergan [Promethazine Hcl] Itching  . Amiodarone Nausea And Vomiting    N/V on IV amio 06/2012  . Nitroglycerin     Hypotension  . Warfarin And Related Other (See Comments)    Scars on skin     Current Outpatient Prescriptions  Medication Sig Dispense Refill  . acetaminophen (TYLENOL) 500 MG tablet Take 500 mg by mouth 3 (three) times daily as needed for pain.      Marland Kitchen albuterol-ipratropium (COMBIVENT) 18-103 MCG/ACT inhaler Inhale 2 puffs into the lungs every 4 (four) hours.      . ALPRAZolam (XANAX) 1 MG tablet Take 1 tablet (1 mg total) by mouth 3 (three) times daily as needed for anxiety.  60 tablet  1  . aspirin EC 81 MG tablet Take 81 mg by mouth daily.      . beclomethasone (QVAR) 80 MCG/ACT inhaler Inhale 1 puff into the lungs 2 (two) times daily.      . benzonatate (TESSALON) 100 MG capsule Take 1 capsule (100 mg total) by mouth every 8 (eight) hours.  21 capsule  0  .  cetirizine (ZYRTEC) 10 MG tablet Take 10 mg by mouth every morning.       . Chlorpheniramine-DM (CORICIDIN HBP COUGH/COLD PO) Take 1 tablet by mouth every 4 (four) hours as needed (cold symptoms).      . furosemide (LASIX) 20 MG tablet Take 1 tablet (20 mg total) by mouth every other day.  30 tablet    . gabapentin (NEURONTIN) 300 MG capsule Take 1 capsule (300 mg total) by mouth 3 (three) times daily.  90 capsule  6  . levothyroxine (SYNTHROID, LEVOTHROID) 50 MCG tablet Take 50 mcg by mouth daily before breakfast.       . magnesium gluconate (MAGONATE) 500 MG tablet Take 250 mg by mouth 2 (two) times daily.      . metoprolol succinate (TOPROL-XL) 25 MG 24 hr tablet Take 12.5 mg by mouth daily.       Marland Kitchen mexiletine (MEXITIL) 150 MG capsule Take 300 mg by mouth 3 (three) times daily.       Marland Kitchen oxyCODONE-acetaminophen (PERCOCET/ROXICET) 5-325 MG per tablet Take 1 tablet by mouth 3 (three) times daily as needed.  15 tablet  0  . polyethylene glycol powder (GLYCOLAX/MIRALAX) powder Take 17 g by mouth as needed.      . Potassium Gluconate 595 MG CAPS Take 595 mg by mouth daily.      . predniSONE (DELTASONE) 20 MG tablet Take 2 tablets (40 mg total) by mouth daily.  10 tablet  0  . ranolazine (RANEXA) 1000 MG SR tablet Take 1,000 mg by mouth  2 (two) times daily.        No current facility-administered medications for this visit.     Past Medical History  Diagnosis Date  . Hypertrophic cardiomyopathy     s/p septal myomectomy with implantable defibrillator 01/27/2012   . Anxiety state, unspecified   . Allergic rhinitis     to dogs  . Asthma   . Anemia     during pregnancy  . History of chicken pox   . Generalized headaches   . Ocular migraine     no HA  . Back pain     told cracked bone, vicodin didn't help, improved with percocets/muscle relaxants, no eval by prior PCP  . 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 DVT (deep venous thrombosis)  01/2012    was placed on Warfarin after cardiac surgery but had skin reaction to warfarin so was changed to Pradaxa so DVT occurred while on Pradaxa although unclear if it occurred during transition  . Hypotension   . Atrial fibrillation     a. On pradaxa.  coumadin necrosis with warfarin;  b. Amio d/c'd 02/2012  . ICD (implantable cardiac defibrillator) in place     02/04/2012, [employer]  . Pericardial effusion   . Chronic combined systolic and diastolic CHF (congestive heart failure)     a. echo 03/19/12: severe asymmetric septal hypertrophy, evidence of upper septal myomectomy, peak LV outflow tract gradient 35, EF 35%,;  b.  Echo 9/13: Severe LVH, EF 30-35%, anteroseptal and inferior HK, LVOT narrow, mild AI, mild to moderate mitral stenosis, severe LAE  . Pericardial tamponade 03/14/2012  . 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 01/2012 s/p BSX ICD;  b. 06/2012 Multaq initiated due to recurrent syncope, VT/VF - had severe n/v with IV amio.  . Skin necrosis --COUMADIN   . Chronic chest wall pain     eval by CVTS, stable, rec return to [facility] if continued pain  . Syncope     a. in setting of VT/VF.  Marland Kitchen Hypothyroid   . Blood transfusion without reported diagnosis   . Neuromuscular disorder   . Gall stones     ROS:   All systems reviewed and negative except as noted in the HPI.   Past Surgical History  Procedure Laterality Date  . Cesarean section  1991  . Induced abortion  1990 and 1993  . Cardiac surgery  01/27/2012    median sternotomy, septal myectomy, MVR - [facility] (Dr. Silvestre Mesi)  . Cardiac defibrillator placement  01/2012  . Myomectomy    . Pericardial window  03/14/2012    Procedure: PERICARDIAL WINDOW;  Surgeon: Purcell Nails, MD;  Location: [facility];  Service: Open Heart Surgery;  Laterality: N/A;  Subxyphoid Pericardial  Window      Family History  Problem Relation Age of Onset  . Diabetes Father   . Heart disease Father     unspecified heart problem  . Thyroid  disease Mother   . Anemia Mother     mediterranean anemia, ITP  . Heart disease Maternal Grandfather     CHF  . Diabetes Paternal Grandfather   . Cancer Paternal Grandfather   . Coronary artery disease Neg Hx   . Stroke Neg Hx   . Sudden death Neg Hx      History   Social History  . Marital Status: Single    Spouse Name: N/A  Number of Children: 1  . Years of Education: N/A   Occupational History  . Naval architectrestaurant manager    Social History Main Topics  . Smoking status: Former Smoker -- 1.00 packs/day for 3 years    Types: Cigarettes    Quit date: 09/30/1988  . Smokeless tobacco: Never Used     Comment: quitin 1990  . Alcohol Use: No     Comment: none since "months" as of 02/28/2013  . Drug Use: No     Comment: quiti n 1989  . Sexual Activity: Yes    Birth Control/ Protection: Pill   Other Topics Concern  . Not on file   Social History Narrative   Caffeine: 1 cup coffee/day   Divorced, 1 child.  lives with boyfriend, dogs   Occupation: Full time Tax adviserfast food restaurant manager   Activity: walks 3130min/day   Diet: fruits/vegetables daily, water, no fish     BP 118/72  Pulse 90  Ht 5' (1.524 m)  Wt 208 lb (94.348 kg)  BMI 40.62 kg/m2  LMP 09/30/2013  Physical Exam:  Chronically ill appearing 43 year old woman, NAD HEENT: Unremarkable Neck:  No JVD, no thyromegally Back:  No CVA tenderness Lungs:  Clear wwith no wheezes, rales, or rhonchi HEART:  Regular rate rhythm, no murmurs, no rubs, no clicks Abd:  soft, positive bowel sounds, no organomegally, no rebound, no guarding Ext:  2 plus pulses, no edema, no cyanosis, no clubbing Skin:  No rashes no nodules Neuro:  CN II through XII intact, motor grossly intact  DEVICE  Normal device function.  See PaceArt for details.   Assess/Plan:

## 2013-10-12 NOTE — Assessment & Plan Note (Signed)
Her symptoms are currently class II and well compensated. She'll continue her medical therapy and maintain a low-sodium diet.

## 2013-10-12 NOTE — Patient Instructions (Signed)
Your physician wants you to follow-up in: 3 months in the device clinic and 6 months with Dr Taylor You will receive a reminder letter in the mail two months in advance. If you don't receive a letter, please call our office to schedule the follow-up appointment.    

## 2013-10-12 NOTE — ED Provider Notes (Signed)
Medical screening examination/treatment/procedure(s) were performed by non-physician practitioner and as supervising physician I was immediately available for consultation/collaboration.  EKG Interpretation    Date/Time:  Sunday October 10 2013 12:23:54 EST Ventricular Rate:  95 PR Interval:  176 QRS Duration: 178 QT Interval:  459 QTC Calculation: 577 R Axis:   7 Text Interpretation:  Sinus rhythm Probable left atrial enlargement Left bundle branch block ED PHYSICIAN INTERPRETATION AVAILABLE IN [facility] Confirmed by TEST, RECORD ([id number]) on 10/12/2013 9:35:42 AM             Geoffery Lyonsouglas Abenezer Odonell, MD 10/12/13 2204

## 2013-10-12 NOTE — Assessment & Plan Note (Signed)
Her ventricular arrhythmias are currently well-controlled. She will continue her combination of medical therapy.

## 2013-10-12 NOTE — Assessment & Plan Note (Signed)
Her [employer] ICD is working normally. Battery longevity is over a years. We'll see her back in several months.

## 2013-10-27 ENCOUNTER — Telehealth: Payer: Self-pay | Admitting: *Deleted

## 2013-10-27 MED ORDER — ALPRAZOLAM 1 MG PO TABS: 1.0000 mg | ORAL_TABLET | Freq: Two times a day (BID) | ORAL | Status: DC

## 2013-10-27 NOTE — Telephone Encounter (Signed)
Ok to send in higher dose temporarily plz phone in #90.  If needed on a more regular basis, will need to come in to discuss better options.

## 2013-10-27 NOTE — Telephone Encounter (Signed)
Pt said that you prescribed her xanax with directions of taking 1 tab TID but only prescribed #60. Pt said she is having a bad month and she is having to take the xanax TID most days she she is going to run out of her Rx soon, pt is requesting that you send in a new Rx with an increase # to last her the full month if she is taking it TID, pt did advise me that she has an old Rx for her prev doctor that she could get filled, I advise pt that xanax is a controlled substance and that one doctor can only prescribe Rx for her so she should not get the old Rx filled and she should wait until she talks to Dr. Timoteo Ex[employer]'s assistant tomorrow, pt verbalized understanding

## 2013-10-28 ENCOUNTER — Encounter: Payer: Self-pay | Admitting: Internal Medicine

## 2013-10-28 NOTE — Telephone Encounter (Signed)
Patient notified and Rx called in as directed. 

## 2013-10-28 NOTE — Telephone Encounter (Signed)
Pt left v/m requesting cb. 

## 2013-10-29 ENCOUNTER — Encounter: Payer: Self-pay | Admitting: Internal Medicine

## 2013-10-29 ENCOUNTER — Ambulatory Visit (INDEPENDENT_AMBULATORY_CARE_PROVIDER_SITE_OTHER): Payer: BC Managed Care – PPO | Admitting: Internal Medicine

## 2013-10-29 VITALS — BP 96/64 | HR 68 | Ht 60.0 in | Wt 209.6 lb

## 2013-10-29 DIAGNOSIS — K811 Chronic cholecystitis: Secondary | ICD-10-CM

## 2013-10-29 DIAGNOSIS — K5900 Constipation, unspecified: Secondary | ICD-10-CM

## 2013-10-29 MED ORDER — URSODIOL 250 MG PO TABS: 250.0000 mg | ORAL_TABLET | Freq: Three times a day (TID) | ORAL | Status: DC

## 2013-10-29 MED ORDER — ALIGN PO CAPS: 1.0000 | ORAL_CAPSULE | Freq: Every day | ORAL | Status: DC
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
## 2013-10-29 NOTE — Patient Instructions (Signed)
We have sent the following medications to your pharmacy for you to pick up at your convenience:Ursodiol Take 1 tablet three times a day, Align  Take Miralax as needed   You will be [facility] to [facility]. We will contact you later today with that referral information.                                               We are excited to introduce MyChart, a new best-in-class service that provides you online access to important information in your electronic medical record. We want to make it easier for you to view your health information - all in one secure location - when and where you need it. We expect MyChart will enhance the quality of care and service we provide.  When you register for MyChart, you can:    View your test results.    Request appointments and receive appointment reminders via email.    Request medication renewals.    View your medical history, allergies, medications and immunizations.    Communicate with your physician's office through a password-protected site.    Conveniently print information such as your medication lists.  To find out if MyChart is right for you, please talk to a member of our clinical staff today. We will gladly answer your questions about this free health and wellness tool.  If you are age 918 or older and want a member of your family to have access to your record, you must provide written consent by completing a proxy form available at our office. Please speak to our clinical staff about guidelines regarding accounts for patients younger than age 43.  As you activate your MyChart account and need any technical assistance, please call the MyChart technical support line at [phone]) or email your question to [email or web address]. If you email your question(s), please include your name, a return phone number and the best time to reach you.  If you have non-urgent health-related questions, you can send a message to our  office through MyChart at [email or web address]. If you have a medical emergency, call 911.  Thank you for using MyChart as your new health and wellness resource!   MyChart licensed from [employer],  [id number]. Patents Pending.

## 2013-11-14 ENCOUNTER — Other Ambulatory Visit: Payer: Self-pay

## 2013-11-14 ENCOUNTER — Emergency Department (HOSPITAL_COMMUNITY): Payer: BC Managed Care – PPO

## 2013-11-14 ENCOUNTER — Encounter (HOSPITAL_COMMUNITY): Payer: Self-pay | Admitting: Emergency Medicine

## 2013-11-14 ENCOUNTER — Inpatient Hospital Stay (HOSPITAL_COMMUNITY)
Admission: EM | Admit: 2013-11-14 | Discharge: 2013-11-17 | DRG: 251 | Disposition: A | Discharge status: Home / Self Care | Payer: BC Managed Care – PPO | Attending: Internal Medicine | Admitting: Internal Medicine

## 2013-11-14 DIAGNOSIS — E876 Hypokalemia: Secondary | ICD-10-CM

## 2013-11-14 DIAGNOSIS — R55 Syncope and collapse: Secondary | ICD-10-CM

## 2013-11-14 DIAGNOSIS — Z01818 Encounter for other preprocedural examination: Secondary | ICD-10-CM

## 2013-11-14 DIAGNOSIS — J3081 Allergic rhinitis due to animal (cat) (dog) hair and dander: Secondary | ICD-10-CM | POA: Diagnosis present

## 2013-11-14 DIAGNOSIS — Z79899 Other long term (current) drug therapy: Secondary | ICD-10-CM

## 2013-11-14 DIAGNOSIS — E039 Hypothyroidism, unspecified: Secondary | ICD-10-CM | POA: Diagnosis present

## 2013-11-14 DIAGNOSIS — J45909 Unspecified asthma, uncomplicated: Secondary | ICD-10-CM | POA: Diagnosis present

## 2013-11-14 DIAGNOSIS — R109 Unspecified abdominal pain: Secondary | ICD-10-CM

## 2013-11-14 DIAGNOSIS — K5900 Constipation, unspecified: Secondary | ICD-10-CM

## 2013-11-14 DIAGNOSIS — Z86718 Personal history of other venous thrombosis and embolism: Secondary | ICD-10-CM

## 2013-11-14 DIAGNOSIS — I4729 Other ventricular tachycardia: Principal | ICD-10-CM | POA: Diagnosis present

## 2013-11-14 DIAGNOSIS — G8929 Other chronic pain: Secondary | ICD-10-CM | POA: Diagnosis present

## 2013-11-14 DIAGNOSIS — Z7982 Long term (current) use of aspirin: Secondary | ICD-10-CM

## 2013-11-14 DIAGNOSIS — I471 Supraventricular tachycardia: Secondary | ICD-10-CM

## 2013-11-14 DIAGNOSIS — I4891 Unspecified atrial fibrillation: Secondary | ICD-10-CM | POA: Diagnosis present

## 2013-11-14 DIAGNOSIS — Z8249 Family history of ischemic heart disease and other diseases of the circulatory system: Secondary | ICD-10-CM

## 2013-11-14 DIAGNOSIS — I4892 Unspecified atrial flutter: Secondary | ICD-10-CM | POA: Diagnosis present

## 2013-11-14 DIAGNOSIS — M549 Dorsalgia, unspecified: Secondary | ICD-10-CM | POA: Diagnosis present

## 2013-11-14 DIAGNOSIS — Z7901 Long term (current) use of anticoagulants: Secondary | ICD-10-CM

## 2013-11-14 DIAGNOSIS — Z954 Presence of other heart-valve replacement: Secondary | ICD-10-CM

## 2013-11-14 DIAGNOSIS — I472 Ventricular tachycardia, unspecified: Principal | ICD-10-CM | POA: Diagnosis present

## 2013-11-14 DIAGNOSIS — Z888 Allergy status to other drugs, medicaments and biological substances status: Secondary | ICD-10-CM

## 2013-11-14 DIAGNOSIS — K8020 Calculus of gallbladder without cholecystitis without obstruction: Secondary | ICD-10-CM | POA: Diagnosis present

## 2013-11-14 DIAGNOSIS — E861 Hypovolemia: Secondary | ICD-10-CM

## 2013-11-14 DIAGNOSIS — I498 Other specified cardiac arrhythmias: Secondary | ICD-10-CM | POA: Diagnosis present

## 2013-11-14 DIAGNOSIS — R079 Chest pain, unspecified: Secondary | ICD-10-CM

## 2013-11-14 DIAGNOSIS — K8010 Calculus of gallbladder with chronic cholecystitis without obstruction: Secondary | ICD-10-CM

## 2013-11-14 DIAGNOSIS — F411 Generalized anxiety disorder: Secondary | ICD-10-CM | POA: Diagnosis present

## 2013-11-14 DIAGNOSIS — Z95810 Presence of automatic (implantable) cardiac defibrillator: Secondary | ICD-10-CM

## 2013-11-14 DIAGNOSIS — I422 Other hypertrophic cardiomyopathy: Secondary | ICD-10-CM | POA: Diagnosis present

## 2013-11-14 DIAGNOSIS — I509 Heart failure, unspecified: Secondary | ICD-10-CM | POA: Diagnosis present

## 2013-11-14 DIAGNOSIS — Z87891 Personal history of nicotine dependence: Secondary | ICD-10-CM

## 2013-11-14 DIAGNOSIS — Z833 Family history of diabetes mellitus: Secondary | ICD-10-CM

## 2013-11-14 DIAGNOSIS — E669 Obesity, unspecified: Secondary | ICD-10-CM

## 2013-11-14 DIAGNOSIS — Z952 Presence of prosthetic heart valve: Secondary | ICD-10-CM

## 2013-11-14 DIAGNOSIS — R112 Nausea with vomiting, unspecified: Secondary | ICD-10-CM | POA: Diagnosis not present

## 2013-11-14 DIAGNOSIS — I4901 Ventricular fibrillation: Secondary | ICD-10-CM

## 2013-11-14 DIAGNOSIS — I5042 Chronic combined systolic (congestive) and diastolic (congestive) heart failure: Secondary | ICD-10-CM | POA: Diagnosis present

## 2013-11-14 HISTORY — DX: Unspecified atrial flutter: I48.92

## 2013-11-14 LAB — CBC WITH DIFFERENTIAL/PLATELET
BASOS ABS: 0.1 10*3/uL (ref 0.0–0.1)
Basophils Relative: 1 % (ref 0–1)
EOS PCT: 2 % (ref 0–5)
Eosinophils Absolute: 0.2 10*3/uL (ref 0.0–0.7)
HCT: 43.1 % (ref 36.0–46.0)
HEMOGLOBIN: 14.1 g/dL (ref 12.0–15.0)
Lymphocytes Relative: 15 % (ref 12–46)
Lymphs Abs: 1.6 10*3/uL (ref 0.7–4.0)
MCH: 28.1 pg (ref 26.0–34.0)
MCHC: 32.7 g/dL (ref 30.0–36.0)
MCV: 86 fL (ref 78.0–100.0)
MONO ABS: 1 10*3/uL (ref 0.1–1.0)
Monocytes Relative: 9 % (ref 3–12)
Neutro Abs: 8.1 10*3/uL — ABNORMAL HIGH (ref 1.7–7.7)
Neutrophils Relative %: 74 % (ref 43–77)
Platelets: 424 10*3/uL — ABNORMAL HIGH (ref 150–400)
RBC: 5.01 MIL/uL (ref 3.87–5.11)
RDW: 14.2 % (ref 11.5–15.5)
WBC: 10.9 10*3/uL — AB (ref 4.0–10.5)

## 2013-11-14 LAB — BASIC METABOLIC PANEL
BUN: 7 mg/dL (ref 6–23)
CHLORIDE: 97 meq/L (ref 96–112)
CO2: 27 mEq/L (ref 19–32)
CREATININE: 0.67 mg/dL (ref 0.50–1.10)
Calcium: 9.5 mg/dL (ref 8.4–10.5)
GFR calc non Af Amer: >90 mL/min (ref >90)
Glucose, Bld: 130 mg/dL — ABNORMAL HIGH (ref 70–99)
POTASSIUM: 4.5 meq/L (ref 3.7–5.3)
Sodium: 138 mEq/L (ref 137–147)

## 2013-11-14 LAB — PRO B NATRIURETIC PEPTIDE: Pro B Natriuretic peptide (BNP): 594.9 pg/mL — ABNORMAL HIGH (ref 0–125)

## 2013-11-14 LAB — TROPONIN I: Troponin I: <0.3 ng/mL (ref <0.30)

## 2013-11-14 LAB — MAGNESIUM: Magnesium: 2.1 mg/dL (ref 1.5–2.5)

## 2013-11-14 LAB — MRSA PCR SCREENING: MRSA BY PCR: NEGATIVE

## 2013-11-14 MED ORDER — ALBUTEROL SULFATE (2.5 MG/3ML) 0.083% IN NEBU: 2.5000 mg | INHALATION_SOLUTION | Freq: Three times a day (TID) | RESPIRATORY_TRACT | Status: DC | PRN

## 2013-11-14 MED ORDER — RANOLAZINE ER 500 MG PO TB12
1000.0000 mg | ORAL_TABLET | Freq: Two times a day (BID) | ORAL | Status: DC
  Administered 2013-11-14 – 2013-11-17 (×6): 1000 mg via ORAL
  Filled 2013-11-14 (×9): qty 2

## 2013-11-14 MED ORDER — ACETAMINOPHEN 500 MG PO TABS: 500.0000 mg | ORAL_TABLET | Freq: Two times a day (BID) | ORAL | Status: DC | PRN

## 2013-11-14 MED ORDER — URSODIOL 250 MG PO TABS: 250.0000 mg | ORAL_TABLET | Freq: Three times a day (TID) | ORAL | Status: DC

## 2013-11-14 MED ORDER — METOPROLOL SUCCINATE ER 25 MG PO TB24
25.0000 mg | ORAL_TABLET | Freq: Every day | ORAL | Status: DC
  Administered 2013-11-15 – 2013-11-17 (×2): 25 mg via ORAL
  Filled 2013-11-14 (×4): qty 1

## 2013-11-14 MED ORDER — ALBUTEROL SULFATE HFA 108 (90 BASE) MCG/ACT IN AERS: 1.0000 | INHALATION_SPRAY | Freq: Three times a day (TID) | RESPIRATORY_TRACT | Status: DC | PRN

## 2013-11-14 MED ORDER — GABAPENTIN 300 MG PO CAPS
300.0000 mg | ORAL_CAPSULE | Freq: Three times a day (TID) | ORAL | Status: DC
  Administered 2013-11-14 – 2013-11-17 (×8): 300 mg via ORAL
  Filled 2013-11-14 (×14): qty 1

## 2013-11-14 MED ORDER — POLYETHYLENE GLYCOL 3350 17 G PO PACK
17.0000 g | PACK | Freq: Every day | ORAL | Status: DC | PRN
  Filled 2013-11-14: qty 1

## 2013-11-14 MED ORDER — LEVOTHYROXINE SODIUM 50 MCG PO TABS
50.0000 ug | ORAL_TABLET | Freq: Every day | ORAL | Status: DC
  Administered 2013-11-15 – 2013-11-17 (×2): 50 ug via ORAL
  Filled 2013-11-14 (×5): qty 1

## 2013-11-14 MED ORDER — DOCUSATE SODIUM 100 MG PO CAPS
100.0000 mg | ORAL_CAPSULE | Freq: Every day | ORAL | Status: DC
  Administered 2013-11-14 – 2013-11-17 (×3): 100 mg via ORAL
  Filled 2013-11-14 (×4): qty 1

## 2013-11-14 MED ORDER — MEXILETINE HCL 150 MG PO CAPS
300.0000 mg | ORAL_CAPSULE | Freq: Three times a day (TID) | ORAL | Status: DC
  Administered 2013-11-14 – 2013-11-17 (×7): 300 mg via ORAL
  Filled 2013-11-14 (×14): qty 2

## 2013-11-14 MED ORDER — SODIUM CHLORIDE 0.9 % IJ SOLN: 3.0000 mL | INTRAMUSCULAR | Status: DC | PRN

## 2013-11-14 MED ORDER — SODIUM CHLORIDE 0.9 % IV SOLN: 250.0000 mL | INTRAVENOUS | Status: DC | PRN

## 2013-11-14 MED ORDER — POLYETHYLENE GLYCOL 3350 17 GM/SCOOP PO POWD
17.0000 g | Freq: Every day | ORAL | Status: DC | PRN
  Filled 2013-11-14: qty 255

## 2013-11-14 MED ORDER — RANOLAZINE ER 500 MG PO TB12: 1000.0000 mg | ORAL_TABLET | Freq: Two times a day (BID) | ORAL | Status: DC

## 2013-11-14 MED ORDER — ASPIRIN EC 81 MG PO TBEC
81.0000 mg | DELAYED_RELEASE_TABLET | Freq: Every day | ORAL | Status: DC
  Administered 2013-11-15 – 2013-11-17 (×2): 81 mg via ORAL
  Filled 2013-11-14 (×3): qty 1

## 2013-11-14 MED ORDER — ALPRAZOLAM 0.25 MG PO TABS
1.0000 mg | ORAL_TABLET | Freq: Two times a day (BID) | ORAL | Status: DC | PRN
  Administered 2013-11-14 – 2013-11-17 (×4): 1 mg via ORAL
  Filled 2013-11-14: qty 4
  Filled 2013-11-14: qty 2
  Filled 2013-11-14 (×2): qty 4

## 2013-11-14 MED ORDER — FLUTICASONE PROPIONATE HFA 44 MCG/ACT IN AERO
1.0000 | INHALATION_SPRAY | Freq: Two times a day (BID) | RESPIRATORY_TRACT | Status: DC
  Administered 2013-11-15 – 2013-11-17 (×3): 1 via RESPIRATORY_TRACT
  Filled 2013-11-14 (×2): qty 10.6

## 2013-11-14 MED ORDER — URSODIOL 60 MG/ML SUSP
150.0000 mg | Freq: Three times a day (TID) | ORAL | Status: DC
  Administered 2013-11-14 – 2013-11-17 (×8): 150 mg via ORAL
  Filled 2013-11-14 (×13): qty 5

## 2013-11-14 MED ORDER — OXYCODONE-ACETAMINOPHEN 5-325 MG PO TABS: 1.0000 | ORAL_TABLET | Freq: Three times a day (TID) | ORAL | Status: DC | PRN

## 2013-11-14 MED ORDER — MAGNESIUM GLUCONATE 500 MG PO TABS
250.0000 mg | ORAL_TABLET | Freq: Two times a day (BID) | ORAL | Status: DC
  Administered 2013-11-14 – 2013-11-15 (×2): 250 mg via ORAL
  Administered 2013-11-16: via ORAL
  Administered 2013-11-17: 250 mg via ORAL
  Filled 2013-11-14 (×10): qty 1

## 2013-11-14 MED ORDER — FUROSEMIDE 20 MG PO TABS
20.0000 mg | ORAL_TABLET | ORAL | Status: DC
  Administered 2013-11-15 – 2013-11-17 (×2): 20 mg via ORAL
  Filled 2013-11-14 (×2): qty 1

## 2013-11-14 MED ORDER — SODIUM CHLORIDE 0.9 % IJ SOLN
3.0000 mL | Freq: Two times a day (BID) | INTRAMUSCULAR | Status: DC
  Administered 2013-11-14 – 2013-11-16 (×3): 3 mL via INTRAVENOUS

## 2013-11-14 NOTE — ED Provider Notes (Signed)
CSN: [id number]     Arrival date & time 11/14/13  1602 History   First MD Initiated Contact with Patient 11/14/13 1605     Chief Complaint  Patient presents with  . Chest Pain     (Consider location/radiation/quality/duration/timing/severity/associated sxs/prior Treatment) HPI Comments: 43 yo F hx hypertrophic cardiomyopathy status post myomectomy, with recurrent ventricular tachycardia and fibrillation, status post ICD implantation presents with CC of ICD discharge.  Pt states was [place] [employer] today a few hours ago, when she suddenly felt palpitations, dizzy.  Pt states she sat down and started to feel okay, but had return of symptoms, and had subsequent discharge of her ICD.  She did not have any prodromal CP, SOB, or diaphoresis.  She has had total of 4 ICD discharges before arrival, and two witnessed so far during H&P.  She does have some CP, feeling hot after ICD discharge. Pt states has had no recent changes to cardiac medications.  He is taking ursidiol for cholelithiasis, which is her only new medication.    The history is provided by the patient. No language interpreter was used.    Past Medical History  Diagnosis Date  . Hypertrophic cardiomyopathy     s/p septal myomectomy with implantable defibrillator 01/27/2012   . Anxiety state, unspecified   . Allergic rhinitis     to dogs  . Asthma   . Anemia     during pregnancy  . History of chicken pox   . Generalized headaches   . Ocular migraine     no HA  . Back pain     told cracked bone, vicodin didn't help, improved with percocets/muscle relaxants, no eval by prior PCP  . 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 DVT (deep venous thrombosis) 01/2012    was placed on Warfarin after cardiac surgery but had skin reaction to warfarin so was changed to Pradaxa so DVT occurred while on Pradaxa although unclear if it occurred during transition  . Hypotension   . Atrial  fibrillation     a. On pradaxa.  coumadin necrosis with warfarin;  b. Amio d/c'd 02/2012  . ICD (implantable cardiac defibrillator) in place     02/04/2012, [employer]  . Pericardial effusion   . Chronic combined systolic and diastolic CHF (congestive heart failure)     a. echo 03/19/12: severe asymmetric septal hypertrophy, evidence of upper septal myomectomy, peak LV outflow tract gradient 35, EF 35%,;  b.  Echo 9/13: Severe LVH, EF 30-35%, anteroseptal and inferior HK, LVOT narrow, mild AI, mild to moderate mitral stenosis, severe LAE  . Pericardial tamponade 03/14/2012  . 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 01/2012 s/p BSX ICD;  b. 06/2012 Multaq initiated due to recurrent syncope, VT/VF - had severe n/v with IV amio.  . Skin necrosis --COUMADIN   . Chronic chest wall pain     eval by CVTS, stable, rec [facility] if continued pain  . Syncope     a. in setting of VT/VF.  Marland Kitchen. Hypothyroid   . Blood transfusion without reported diagnosis   . Neuromuscular disorder   . Gall stones    Past Surgical History  Procedure Laterality Date  . Cesarean section  1991  . Induced abortion  1990 and 1993  . Cardiac surgery  01/27/2012    median sternotomy, septal myectomy, MVR - [facility] (Dr. Silvestre MesiGlower)  . Cardiac defibrillator placement  01/2012  . Myomectomy    . Pericardial window  03/14/2012    Procedure: PERICARDIAL WINDOW;  Surgeon: Purcell Nails, MD;  Location: [facility] OR;  Service: Open Heart Surgery;  Laterality: N/A;  Subxyphoid Pericardial  Window    Family History  Problem Relation Age of Onset  . Diabetes Father   . Heart disease Father     unspecified heart problem  . Thyroid disease Mother   . Anemia Mother     mediterranean anemia, ITP  . Heart disease Maternal Grandfather     CHF  . Diabetes Paternal Grandfather   . Cancer Paternal Grandfather   . Coronary artery disease Neg Hx   . Stroke Neg Hx   . Sudden death Neg Hx    History  Substance Use Topics  . Smoking  status: Former Smoker -- 1.00 packs/day for 3 years    Types: Cigarettes    Quit date: 09/30/1988  . Smokeless tobacco: Never Used     Comment: quitin 1990  . Alcohol Use: No     Comment: none since "months" as of 02/28/2013   OB History   Grav Para Term Preterm Abortions TAB SAB Ect Mult Living                 Review of Systems  Constitutional: Negative for fever and chills.  Respiratory: Negative for cough and shortness of breath.   Cardiovascular: Positive for chest pain and palpitations. Negative for leg swelling.       ICD discharge  Gastrointestinal: Negative for nausea, vomiting, abdominal pain, diarrhea and constipation.  Musculoskeletal: Negative for myalgias.  Skin: Negative for rash.  Neurological: Positive for dizziness and light-headedness. Negative for weakness, numbness and headaches.  Hematological: Negative for adenopathy. Does not bruise/bleed easily.  All other systems reviewed and are negative.      Allergies  Phenergan; Amiodarone; Nitroglycerin; and Warfarin and related  Home Medications   Current Outpatient Rx  Name  Route  Sig  Dispense  Refill  . acetaminophen (TYLENOL) 500 MG tablet   Oral   Take 500 mg by mouth 3 (three) times daily as needed for pain.         Marland Kitchen albuterol-ipratropium (COMBIVENT) 18-103 MCG/ACT inhaler   Inhalation   Inhale 2 puffs into the lungs every 4 (four) hours.         . ALPRAZolam (XANAX) 1 MG tablet   Oral   Take 1 tablet (1 mg total) by mouth 2 (two) times daily. Extra third as needed   90 tablet   0   . aspirin EC 81 MG tablet   Oral   Take 81 mg by mouth daily.         . beclomethasone (QVAR) 80 MCG/ACT inhaler   Inhalation   Inhale 1 puff into the lungs 2 (two) times daily.         . bifidobacterium infantis (ALIGN) capsule   Oral   Take 1 capsule by mouth daily.   14 capsule   3   . cetirizine (ZYRTEC) 10 MG tablet   Oral   Take 10 mg by mouth every morning.          .  chlorpheniramine-HYDROcodone (TUSSIONEX) 10-8 MG/5ML LQCR               . furosemide (LASIX) 20 MG tablet   Oral   Take 1 tablet (20 mg total) by mouth every other day.   30 tablet      .  gabapentin (NEURONTIN) 300 MG capsule   Oral   Take 1 capsule (300 mg total) by mouth 3 (three) times daily.   90 capsule   6   . levothyroxine (SYNTHROID, LEVOTHROID) 50 MCG tablet   Oral   Take 50 mcg by mouth daily before breakfast.          . magnesium gluconate (MAGONATE) 500 MG tablet   Oral   Take 250 mg by mouth 2 (two) times daily.         . metoprolol succinate (TOPROL-XL) 25 MG 24 hr tablet   Oral   Take 12.5 mg by mouth daily.          Marland Kitchen mexiletine (MEXITIL) 150 MG capsule   Oral   Take 2 capsules (300 mg total) by mouth 3 (three) times daily.   180 capsule   6   . oxyCODONE-acetaminophen (PERCOCET/ROXICET) 5-325 MG per tablet   Oral   Take 1 tablet by mouth 3 (three) times daily as needed.   15 tablet   0   . polyethylene glycol powder (GLYCOLAX/MIRALAX) powder   Oral   Take 17 g by mouth as needed.         . Potassium Gluconate 595 MG CAPS   Oral   Take 595 mg by mouth daily.         . ranolazine (RANEXA) 1000 MG SR tablet   Oral   Take 1,000 mg by mouth 2 (two) times daily.          . ursodiol (ACTIGALL) 250 MG tablet   Oral   Take 1 tablet (250 mg total) by mouth 3 (three) times daily.   90 tablet   3    BP 104/69  Pulse 95  Temp(Src) 97.9 F (36.6 C) (Oral)  Resp 16  SpO2 100% Physical Exam  Nursing note and vitals reviewed. Constitutional: She is oriented to person, place, and time. She appears well-developed and well-nourished.  HENT:  Head: Normocephalic and atraumatic.  Right Ear: External ear normal.  Left Ear: External ear normal.  Mouth/Throat: Oropharynx is clear and moist.  Eyes: Conjunctivae and EOM are normal. Pupils are equal, round, and reactive to light.  Neck: Normal range of motion. Neck supple.  Cardiovascular:  Normal heart sounds and intact distal pulses.   Paced rhythm on monitor.  Pt had two witnessed episodes of V-tach, with subsequent ICD discharges.  No witnessed LOC.  Pulmonary/Chest: Effort normal and breath sounds normal. No respiratory distress. She has no wheezes. She has no rales. She exhibits no tenderness.  Abdominal: Soft. Bowel sounds are normal. She exhibits no distension and no mass. There is no tenderness. There is no rebound and no guarding.  Musculoskeletal: Normal range of motion.  Neurological: She is alert and oriented to person, place, and time.  Skin: Skin is warm and dry.    ED Course  Procedures (including critical care time) Labs Review Labs Reviewed  CBC WITH DIFFERENTIAL - Abnormal; Notable for the following:    WBC 10.9 (*)    Platelets 424 (*)    Neutro Abs 8.1 (*)    All other components within normal limits  BASIC METABOLIC PANEL - Abnormal; Notable for the following:    Glucose, Bld 130 (*)    All other components within normal limits  PRO B NATRIURETIC PEPTIDE - Abnormal; Notable for the following:    Pro B Natriuretic peptide (BNP) 594.9 (*)    All other components within normal limits  MRSA PCR SCREENING  TROPONIN I  MAGNESIUM  TSH  T4, FREE  BASIC METABOLIC PANEL   Imaging Review Dg Chest Port 1 View  11/14/2013   CLINICAL DATA:  Defib firing  EXAM: PORTABLE CHEST - 1 VIEW  COMPARISON:  DG CHEST 2 VIEW dated 10/10/2013  FINDINGS: Low lung volumes. The cardiac silhouette is enlarged. Patient is status post median sternotomy. A left chest wall dual chamber AICD is appreciated lead tips project in the region of the right atrium right ventricle. Lungs are clear. Osseous structures unremarkable.  IMPRESSION: No active disease.   Electronically Signed   By: Salome Holmes M.D.   On: 11/14/2013 17:12    EKG Interpretation   None       MDM   Final diagnoses:  None   43 yo F hx hypertrophic cardiomyopathy status post myomectomy, with recurrent  ventricular tachycardia and fibrillation, status post ICD implantation presents with CC of ICD discharge.  Filed Vitals:   11/14/13 2303  BP: 93/69  Pulse: 86  Temp:   Resp: 18   Physical exam as above.  Pt with two witnessed episodes of V-tach, s/p ICD discharges in ED.  Pt with [employer] device.  EKG HR 140, QRS 35, QT/QTc 351/545, atrial paced rhthym, LBBB, LVH, abnormal T in anterior leads. CXR WNL, no active disease.  Troponin < 0.30.  BNP 594.9.  BMP, Mag WNL.  CBC mild leukocytosis, with normal Hgb.    Cardiology consulted for interrogation and admission shortly after pt arrival.  Pt to be admitted for further evaluation and management of recurrent V-tach, ICD firing.  Pt allergic to amiodarone, thus not given.  Pt understands and agrees with plan.  Pt's care plan discussed with Dr. Bernette Mayers.  Jon Gills, MD     Jon Gills, MD 11/15/13 [phone]

## 2013-11-14 NOTE — H&P (Addendum)
CARDIOLOGY ADMIT NOTE     Primary Care Physician: Eustaquio BoydenJavier Gutierrez, MD Referring Physician: ER  Admit Date: 11/14/2013  Reason for consultation:  ICD discharge  [name] is a 43 y.o. female with a h/o hypertrophic CM and prior ventricular arrhythmias who presents for recurrent ICD shocks.  She has had longstanding difficulty with hypertrophic CM and has had [facility] which was unsuccessful.  She has failed medical therapy with multiple antiarrhythmic medicines.  She has recently been on mexiletine, ranolazine, and toprol. Today, she reports being in her usual state of health and went shopping.  She developed tachypalpitations with weakness/ fatigue.  She sat down to rest and received an ICD shock.  She received several additional ICD shocks.  She therefore [facility].  Presently, she is resting without complaint. Today, she denies symptoms of chest pain, shortness of breath, orthopnea, PND, lower extremity edema, dizziness, presyncope, syncope, or neurologic sequela. The patient is tolerating medications without difficulties and is otherwise without complaint today.   Past Medical History  Diagnosis Date  . Hypertrophic cardiomyopathy     s/p septal myomectomy with implantable defibrillator 01/27/2012   . Anxiety state, unspecified   . Allergic rhinitis     to dogs  . Asthma   . Anemia     during pregnancy  . History of chicken pox   . Generalized headaches   . Ocular migraine     no HA  . Back pain     told cracked bone, vicodin didn't help, improved with percocets/muscle relaxants, no eval by prior PCP  . 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 DVT (deep venous thrombosis) 01/2012    was placed on Warfarin after cardiac surgery but had skin reaction to warfarin so was changed to Pradaxa so DVT occurred while on Pradaxa although unclear if it occurred during transition  .  Hypotension   . Atrial fibrillation     a. On pradaxa.  coumadin necrosis with warfarin;  b. Amio d/c'd 02/2012  . ICD (implantable cardiac defibrillator) in place     02/04/2012, [employer]  . Pericardial effusion   . Chronic combined systolic and diastolic CHF (congestive heart failure)     a. echo 03/19/12: severe asymmetric septal hypertrophy, evidence of upper septal myomectomy, peak LV outflow tract gradient 35, EF 35%,;  b.  Echo 9/13: Severe LVH, EF 30-35%, anteroseptal and inferior HK, LVOT narrow, mild AI, mild to moderate mitral stenosis, severe LAE  . Pericardial tamponade 03/14/2012  . 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 01/2012 s/p BSX ICD;  b. 06/2012 Multaq initiated due to recurrent syncope, VT/VF - had severe n/v with IV amio.  . Skin necrosis --COUMADIN   . Chronic chest wall pain     eval by CVTS, stable, [facility] if [facility]  . Syncope     a. in setting of VT/VF.  Marland Kitchen. Hypothyroid   . Blood transfusion without reported diagnosis   . Neuromuscular disorder   . Gall stones    Past Surgical History  Procedure Laterality Date  . Cesarean section  1991  . Induced abortion  1990 and 1993  . Cardiac surgery  01/27/2012    median sternotomy, septal myectomy, MVR - [facility] (Dr. Silvestre MesiGlower)  . Cardiac defibrillator placement  01/2012  . Myomectomy    . Pericardial window  03/14/2012  Procedure: PERICARDIAL WINDOW;  Surgeon: Purcell Nails, MD;  Location: [facility] OR;  Service: Open Heart Surgery;  Laterality: N/A;  Subxyphoid Pericardial  Window     Medicines are reviewed   Allergies  Allergen Reactions  . Phenergan [Promethazine Hcl] Itching  . Amiodarone Nausea And Vomiting    N/V on IV amio 06/2012  . Nitroglycerin     Hypotension  . Warfarin And Related Other (See Comments)    Scars on skin    History   Social History  . Marital Status: Single    Spouse Name: N/A    Number of Children: 1  . Years of Education: N/A   Occupational History    . Naval architect    Social History Main Topics  . Smoking status: Former Smoker -- 1.00 packs/day for 3 years    Types: Cigarettes    Quit date: 09/30/1988  . Smokeless tobacco: Never Used     Comment: quitin 1990  . Alcohol Use: No     Comment: none since "months" as of 02/28/2013  . Drug Use: No     Comment: quiti n 1989  . Sexual Activity: Yes    Birth Control/ Protection: Pill   Other Topics Concern  . Not on file   Social History Narrative   Caffeine: 1 cup coffee/day   Divorced, 1 child.  lives with boyfriend, dogs   Occupation: Full time Tax adviser   Activity: walks 5min/day   Diet: fruits/vegetables daily, water, no fish    Family History  Problem Relation Age of Onset  . Diabetes Father   . Heart disease Father     unspecified heart problem  . Thyroid disease Mother   . Anemia Mother     mediterranean anemia, ITP  . Heart disease Maternal Grandfather     CHF  . Diabetes Paternal Grandfather   . Cancer Paternal Grandfather   . Coronary artery disease Neg Hx   . Stroke Neg Hx   . Sudden death Neg Hx     ROS- All systems are reviewed and negative except as per the HPI above  Physical Exam: Telemetry: Filed Vitals:   11/14/13 1611 11/14/13 1630 11/14/13 1705  BP: 104/69  107/63  Pulse: 95 91 91  Temp: 97.9 F (36.6 C)    TempSrc: Oral    Resp: 16 23 20   SpO2: 100% 99% 97%    GEN- The patient is anxious appearing, alert and oriented x 3 today.   Head- normocephalic, atraumatic Eyes-  Sclera clear, conjunctiva pink Ears- hearing intact Oropharynx- clear Neck- supple, no JVP Lymph- no cervical lymphadenopathy Lungs- Clear to ausculation bilaterally, normal work of breathing Heart- Regular rate and rhythm, 2/6 SEM GI- soft, NT, ND, + BS Extremities- no clubbing, cyanosis, or edema MS- no significant deformity or atrophy Skin- no rash or lesion Psych- euthymic mood, full affect Neuro- strength and sensation are  intact  EKG:  Sinus rhythm with LBBB  Labs:   Lab Results  Component Value Date   WBC 10.9* 11/14/2013   HGB 14.1 11/14/2013   HCT 43.1 11/14/2013   MCV 86.0 11/14/2013   PLT 424* 11/14/2013    Recent Labs Lab 11/14/13 1625  NA 138  K 4.5  CL 97  CO2 27  BUN 7  CREATININE 0.67  CALCIUM 9.5  GLUCOSE 130*   Lab Results  Component Value Date   CKTOTAL 77 03/10/2012   CKMB 14.6* 03/10/2012   TROPONINI <0.30 11/14/2013  Lab Results  Component Value Date   CHOL 192 08/16/2013   Lab Results  Component Value Date   HDL 49.10 08/16/2013   Lab Results  Component Value Date   LDLCALC 113* 08/16/2013   Lab Results  Component Value Date   TRIG 152.0* 08/16/2013   Lab Results  Component Value Date   CHOLHDL 4 08/16/2013   No results found for this basename: LDLDIRECT     Dr Bruna Potter note are reviewed ICD interrogation is pending  ASSESSMENT AND PLAN:    1. VT The patient has a h/o known VT for which she has been treated by Dr Ladona Ridgel in the past.  She has been intolerant of amiodarone due to nausea.  She now presents with repeated ICD shocks.  At this time, I will interogate her ICD and evaluate her arrhythmias.  I will increase toprol xl at this time.  Check TFTs.  Consider norpace as an AAD option.  I will discuss this first with Dr Ladona Ridgel. Repeat ablation may also be considered but I think that we will defer this decision until we see what her ICD interrogation reveals and how she will do with norpace.  2. HCM Increase toprol as BP tolerates  Admit to TCU   Hillis Range, MD 11/14/2013  5:24 PM   Addendum Per Annia [employer] rep, ICD shocks are delivered for an atrial tachycardia at 190-200 bpm.  I have asked her to reprogram with discriminators on (VT zone) for rates 190-220.  Dr Ladona Ridgel will need to review in the am.  For now will increase toprol but should consider norpace.  Hillis Range MD

## 2013-11-14 NOTE — ED Notes (Signed)
PT reports Defib fired several times today. Pt transported per EMS from home. Pt had multiple runs of VTach and Defib fired several time. Pt also reports feeling dizzy.

## 2013-11-15 DIAGNOSIS — Z95810 Presence of automatic (implantable) cardiac defibrillator: Secondary | ICD-10-CM

## 2013-11-15 DIAGNOSIS — I498 Other specified cardiac arrhythmias: Secondary | ICD-10-CM

## 2013-11-15 LAB — BASIC METABOLIC PANEL
BUN: 10 mg/dL (ref 6–23)
CALCIUM: 9 mg/dL (ref 8.4–10.5)
CO2: 24 mEq/L (ref 19–32)
Chloride: 101 mEq/L (ref 96–112)
Creatinine, Ser: 0.73 mg/dL (ref 0.50–1.10)
Glucose, Bld: 98 mg/dL (ref 70–99)
POTASSIUM: 3.8 meq/L (ref 3.7–5.3)
SODIUM: 138 meq/L (ref 137–147)

## 2013-11-15 LAB — TSH: TSH: 0.561 u[IU]/mL (ref 0.350–4.500)

## 2013-11-15 LAB — T4, FREE: FREE T4: 1.06 ng/dL (ref 0.80–1.80)

## 2013-11-15 NOTE — Progress Notes (Signed)
Patient: [name] Date of Encounter: 11/15/2013, 2:14 PM Admit date: 11/14/2013     Subjective  Ms. Menken has no complaints currently. She denies CP, SOB or palpitations. Yesterday received multiple ICD shocks while shopping at [place]. Device interrogation shows probable SVT.   Objective  Physical Exam: Vitals: BP 92/56  Pulse 77  Temp(Src) 97.9 F (36.6 C) (Oral)  Resp 21  Ht 5' (1.524 m)  Wt 212 lb 8.4 oz (96.4 kg)  BMI 41.51 kg/m2  SpO2 96% General: Well developed, well appearing 43 year old female in no acute distress. Neck: Supple. JVD not elevated. Lungs: Clear bilaterally to auscultation without wheezes, rales, or rhonchi. Breathing is unlabored. Heart: RRR S1 S2 without murmurs, rubs, or gallops.  Abdomen: Soft, non-distended. Extremities: No clubbing or cyanosis. No edema.  Distal pedal pulses are 2+ and equal bilaterally. Neuro: Alert and oriented X 3. Moves all extremities spontaneously. No focal deficits.  Intake/Output:  Intake/Output Summary (Last 24 hours) at 11/15/13 1414 Last data filed at 11/15/13 1400  Gross per 24 hour  Intake    446 ml  Output      0 ml  Net    446 ml    Inpatient Medications:  . aspirin EC  81 mg Oral Daily  . docusate sodium  100 mg Oral Daily  . fluticasone  1 puff Inhalation BID  . furosemide  20 mg Oral QODAY  . gabapentin  300 mg Oral TID  . levothyroxine  50 mcg Oral QAC breakfast  . magnesium gluconate  250 mg Oral BID  . metoprolol succinate  25 mg Oral Daily  . mexiletine  300 mg Oral TID  . ranolazine  1,000 mg Oral BID  . sodium chloride  3 mL Intravenous Q12H  . ursodiol  150 mg Oral TID    Labs:  Recent Labs  11/14/13 1625 11/15/13 0435  NA 138 138  K 4.5 3.8  CL 97 101  CO2 27 24  GLUCOSE 130* 98  BUN 7 10  CREATININE 0.67 0.73  CALCIUM 9.5 9.0  MG 2.1  --     Recent Labs  11/14/13 1645  WBC 10.9*  NEUTROABS 8.1*  HGB 14.1  HCT 43.1  MCV 86.0  PLT 424*    Recent Labs  11/14/13 1625  TROPONINI <0.30    Recent Labs  11/14/13 2006  TSH 0.561    Radiology/Studies: Dg Chest Port 1 View  11/14/2013   CLINICAL DATA:  Defib firing  EXAM: PORTABLE CHEST - 1 VIEW  COMPARISON:  DG CHEST 2 VIEW dated 10/10/2013  FINDINGS: Low lung volumes. The cardiac silhouette is enlarged. Patient is status post median sternotomy. A left chest wall dual chamber AICD is appreciated lead tips project in the region of the right atrium right ventricle. Lungs are clear. Osseous structures unremarkable.  IMPRESSION: No active disease.   Electronically Signed   By: Salome Holmes M.D.   On: 11/14/2013 17:12   Telemetry: SR currently Device interrogation: performed yesterday and this AM by industry and reviewed by Dr. Ladona Ridgel - intermittent SVT, rates 190-200 bpm, with ICD shocks delivered   Assessment and Plan   1. SVT - s/p ICD shocks yesterday - Dr. Johney Frame reprogrammed tachy detection settings yesterday with discriminators on in VT zone for rates 190-220 bpm - metoprolol was also increased - Dr. Ladona Ridgel has reviewed EGMs  - discussed possible EPS +RF ablation  2. Paroxysmal VT s/p unsuccessful ablation at [facility] - continue  mexiletine and ranolazine per [facility] - continue BB  3. HCM s/p myomectomy 2013 - metoprolol up-titrated yesterday  Signed, EDMISTEN, BROOKE PA-C  EP Attending  Patient seen and examined. Agree with above. I have reviewed the rhythm strips prior to device therapy. Her tachycardia begins with a PAC, and during tachycardia, there are more A's than V's and changes in the AA interval appear to precede changes in the VV interval. I have discussed the treatment options and have recommending catheter ablation. Will try to do this tomorrow. DC NPO.  Lewayne BuntingGregg Taylor, M.D.

## 2013-11-15 NOTE — Progress Notes (Signed)
Utilization Review Completed.  

## 2013-11-16 ENCOUNTER — Encounter (HOSPITAL_COMMUNITY)
Admission: EM | Disposition: A | Discharge status: Home / Self Care | Payer: Self-pay | Source: Home / Self Care | Attending: Internal Medicine

## 2013-11-16 ENCOUNTER — Encounter (HOSPITAL_COMMUNITY): Payer: Self-pay | Admitting: Anesthesiology

## 2013-11-16 ENCOUNTER — Inpatient Hospital Stay (HOSPITAL_COMMUNITY): Payer: BC Managed Care – PPO | Admitting: Anesthesiology

## 2013-11-16 ENCOUNTER — Encounter (HOSPITAL_COMMUNITY): Payer: BC Managed Care – PPO | Admitting: Anesthesiology

## 2013-11-16 ENCOUNTER — Ambulatory Visit: Payer: BC Managed Care – PPO | Admitting: Psychology

## 2013-11-16 DIAGNOSIS — I4892 Unspecified atrial flutter: Secondary | ICD-10-CM

## 2013-11-16 HISTORY — PX: ABLATION: SHX5711

## 2013-11-16 HISTORY — PX: ATRIAL FLUTTER ABLATION: SHX5733

## 2013-11-16 LAB — CREATININE, SERUM
Creatinine, Ser: 0.72 mg/dL (ref 0.50–1.10)
GFR calc Af Amer: >90 mL/min (ref >90)

## 2013-11-16 LAB — CBC
HCT: 39.2 % (ref 36.0–46.0)
Hemoglobin: 12.6 g/dL (ref 12.0–15.0)
MCH: 28 pg (ref 26.0–34.0)
MCHC: 32.1 g/dL (ref 30.0–36.0)
MCV: 87.1 fL (ref 78.0–100.0)
PLATELETS: 307 10*3/uL (ref 150–400)
RBC: 4.5 MIL/uL (ref 3.87–5.11)
RDW: 14.5 % (ref 11.5–15.5)
WBC: 7.5 10*3/uL (ref 4.0–10.5)

## 2013-11-16 SURGERY — ELECTROPHYSIOLOGY STUDY
Anesthesia: Monitor Anesthesia Care

## 2013-11-16 SURGERY — ATRIAL FLUTTER ABLATION
Anesthesia: General

## 2013-11-16 MED ORDER — RANOLAZINE ER 500 MG PO TB12
1000.0000 mg | ORAL_TABLET | ORAL | Status: AC
  Filled 2013-11-16: qty 2

## 2013-11-16 MED ORDER — OXYCODONE HCL 5 MG PO TABS: 5.0000 mg | ORAL_TABLET | Freq: Once | ORAL | Status: DC | PRN

## 2013-11-16 MED ORDER — SODIUM CHLORIDE 0.9 % IV SOLN: 250.0000 mL | INTRAVENOUS | Status: DC | PRN

## 2013-11-16 MED ORDER — BUPIVACAINE HCL (PF) 0.25 % IJ SOLN
INTRAMUSCULAR | Status: AC
  Filled 2013-11-16: qty 60

## 2013-11-16 MED ORDER — DEXMEDETOMIDINE HCL IN NACL 200 MCG/50ML IV SOLN
INTRAVENOUS | Status: DC | PRN
  Administered 2013-11-16: 0.4 ug/kg/h via INTRAVENOUS

## 2013-11-16 MED ORDER — ONDANSETRON HCL 4 MG/2ML IJ SOLN
4.0000 mg | Freq: Once | INTRAMUSCULAR | Status: AC
  Administered 2013-11-16: 4 mg via INTRAVENOUS
  Filled 2013-11-16: qty 2

## 2013-11-16 MED ORDER — SODIUM CHLORIDE 0.9 % IJ SOLN
3.0000 mL | Freq: Two times a day (BID) | INTRAMUSCULAR | Status: DC
  Administered 2013-11-16 – 2013-11-17 (×2): 3 mL via INTRAVENOUS

## 2013-11-16 MED ORDER — SODIUM CHLORIDE 0.9 % IJ SOLN
3.0000 mL | INTRAMUSCULAR | Status: DC | PRN
Start: 2013-11-16 — End: 2013-11-17

## 2013-11-16 MED ORDER — MEXILETINE HCL 150 MG PO CAPS
300.0000 mg | ORAL_CAPSULE | ORAL | Status: AC
  Administered 2013-11-16: 300 mg via ORAL
  Filled 2013-11-16: qty 2

## 2013-11-16 MED ORDER — ONDANSETRON HCL 4 MG/2ML IJ SOLN
4.0000 mg | Freq: Four times a day (QID) | INTRAMUSCULAR | Status: DC | PRN
  Administered 2013-11-16 – 2013-11-17 (×2): 4 mg via INTRAVENOUS
  Filled 2013-11-16 (×2): qty 2

## 2013-11-16 MED ORDER — FENTANYL CITRATE 0.05 MG/ML IJ SOLN
INTRAMUSCULAR | Status: DC | PRN
  Administered 2013-11-16: 25 ug via INTRAVENOUS
  Administered 2013-11-16 (×3): 50 ug via INTRAVENOUS
  Administered 2013-11-16: 25 ug via INTRAVENOUS
  Administered 2013-11-16 (×3): 50 ug via INTRAVENOUS

## 2013-11-16 MED ORDER — OXYCODONE HCL 5 MG/5ML PO SOLN: 5.0000 mg | Freq: Once | ORAL | Status: DC | PRN

## 2013-11-16 MED ORDER — MIDAZOLAM HCL 5 MG/5ML IJ SOLN
INTRAMUSCULAR | Status: DC | PRN
  Administered 2013-11-16 (×3): 1 mg via INTRAVENOUS
  Administered 2013-11-16: 2 mg via INTRAVENOUS
  Administered 2013-11-16: 1 mg via INTRAVENOUS

## 2013-11-16 MED ORDER — SODIUM CHLORIDE 0.9 % IV SOLN
INTRAVENOUS | Status: AC
  Administered 2013-11-16: 16:00:00 via INTRAVENOUS

## 2013-11-16 MED ORDER — HEPARIN SODIUM (PORCINE) 5000 UNIT/ML IJ SOLN
5000.0000 [IU] | Freq: Three times a day (TID) | INTRAMUSCULAR | Status: DC
  Administered 2013-11-16 – 2013-11-17 (×2): 5000 [IU] via SUBCUTANEOUS
  Filled 2013-11-16 (×6): qty 1

## 2013-11-16 MED ORDER — HYDROMORPHONE HCL PF 1 MG/ML IJ SOLN: 0.2500 mg | INTRAMUSCULAR | Status: DC | PRN

## 2013-11-16 MED ORDER — LACTATED RINGERS IV SOLN
INTRAVENOUS | Status: DC | PRN
  Administered 2013-11-16: 09:00:00 via INTRAVENOUS

## 2013-11-16 NOTE — H&P (View-Only) (Signed)
Patient: [name] Date of Encounter: 11/15/2013, 2:14 PM Admit date: 11/14/2013     Subjective  Ms. Colson has no complaints currently. She denies CP, SOB or palpitations. Yesterday received multiple ICD shocks while shopping at [place]. Device interrogation shows probable SVT.   Objective  Physical Exam: Vitals: BP 92/56  Pulse 77  Temp(Src) 97.9 F (36.6 C) (Oral)  Resp 21  Ht 5' (1.524 m)  Wt 212 lb 8.4 oz (96.4 kg)  BMI 41.51 kg/m2  SpO2 96% General: Well developed, well appearing 43 year old female in no acute distress. Neck: Supple. JVD not elevated. Lungs: Clear bilaterally to auscultation without wheezes, rales, or rhonchi. Breathing is unlabored. Heart: RRR S1 S2 without murmurs, rubs, or gallops.  Abdomen: Soft, non-distended. Extremities: No clubbing or cyanosis. No edema.  Distal pedal pulses are 2+ and equal bilaterally. Neuro: Alert and oriented X 3. Moves all extremities spontaneously. No focal deficits.  Intake/Output:  Intake/Output Summary (Last 24 hours) at 11/15/13 1414 Last data filed at 11/15/13 1400  Gross per 24 hour  Intake    446 ml  Output      0 ml  Net    446 ml    Inpatient Medications:  . aspirin EC  81 mg Oral Daily  . docusate sodium  100 mg Oral Daily  . fluticasone  1 puff Inhalation BID  . furosemide  20 mg Oral QODAY  . gabapentin  300 mg Oral TID  . levothyroxine  50 mcg Oral QAC breakfast  . magnesium gluconate  250 mg Oral BID  . metoprolol succinate  25 mg Oral Daily  . mexiletine  300 mg Oral TID  . ranolazine  1,000 mg Oral BID  . sodium chloride  3 mL Intravenous Q12H  . ursodiol  150 mg Oral TID    Labs:  Recent Labs  11/14/13 1625 11/15/13 0435  NA 138 138  K 4.5 3.8  CL 97 101  CO2 27 24  GLUCOSE 130* 98  BUN 7 10  CREATININE 0.67 0.73  CALCIUM 9.5 9.0  MG 2.1  --     Recent Labs  11/14/13 1645  WBC 10.9*  NEUTROABS 8.1*  HGB 14.1  HCT 43.1  MCV 86.0  PLT 424*    Recent Labs  11/14/13 1625  TROPONINI <0.30    Recent Labs  11/14/13 2006  TSH 0.561    Radiology/Studies: Dg Chest Port 1 View  11/14/2013   CLINICAL DATA:  Defib firing  EXAM: PORTABLE CHEST - 1 VIEW  COMPARISON:  DG CHEST 2 VIEW dated 10/10/2013  FINDINGS: Low lung volumes. The cardiac silhouette is enlarged. Patient is status post median sternotomy. A left chest wall dual chamber AICD is appreciated lead tips project in the region of the right atrium right ventricle. Lungs are clear. Osseous structures unremarkable.  IMPRESSION: No active disease.   Electronically Signed   By: Salome Holmes M.D.   On: 11/14/2013 17:12   Telemetry: SR currently Device interrogation: performed yesterday and this AM by industry and reviewed by Dr. Ladona Ridgel - intermittent SVT, rates 190-200 bpm, with ICD shocks delivered   Assessment and Plan   1. SVT - s/p ICD shocks yesterday - Dr. Johney Frame reprogrammed tachy detection settings yesterday with discriminators on in VT zone for rates 190-220 bpm - metoprolol was also increased - Dr. Ladona Ridgel has reviewed EGMs  - discussed possible EPS +RF ablation  2. Paroxysmal VT s/p unsuccessful ablation at [facility] - continue  mexiletine and ranolazine [facility] - continue BB  3. HCM s/p myomectomy 2013 - metoprolol up-titrated yesterday  Signed, EDMISTEN, BROOKE PA-C  EP Attending  Patient seen and examined. Agree with above. I have reviewed the rhythm strips prior to device therapy. Her tachycardia begins with a PAC, and during tachycardia, there are more A's than V's and changes in the AA interval appear to precede changes in the VV interval. I have discussed the treatment options and have recommending catheter ablation. Will try to do this tomorrow. DC NPO.  Lewayne BuntingGregg Chassity Ludke, M.D.

## 2013-11-16 NOTE — Anesthesia Postprocedure Evaluation (Signed)
  Anesthesia Post-op Note  Patient: Carlon A Weld  Procedure(s) Performed: Procedure(s): ATRIAL FLUTTER ABLATION (N/A)  Patient Location: PACU and Cath Lab  Anesthesia Type:MAC  Level of Consciousness: awake, alert , oriented and patient cooperative  Airway and Oxygen Therapy: Patient Spontanous Breathing and Patient connected to nasal cannula oxygen  Post-op Pain: none  Post-op Assessment: Post-op Vital signs reviewed, Patient's Cardiovascular Status Stable, Respiratory Function Stable, Patent Airway and No signs of Nausea or vomiting  Post-op Vital Signs: Reviewed and stable  Complications: No apparent anesthesia complications

## 2013-11-16 NOTE — CV Procedure (Signed)
EPS/RFA of atrial flutter with 3D electroanatomic mapping without immediate complication. R#[id number].

## 2013-11-16 NOTE — Transfer of Care (Signed)
Immediate Anesthesia Transfer of Care Note  Patient: [name]  Procedure(s) Performed: Procedure(s): ATRIAL FLUTTER ABLATION (N/A)  Patient Location: PACU and Cath Lab  Anesthesia Type:MAC and Cath Lab Recovery Area  Level of Consciousness: awake, alert , oriented and patient cooperative  Airway & Oxygen Therapy: Patient Spontanous Breathing and Patient connected to nasal cannula oxygen  Post-op Assessment: Report given to PACU RN and Post -op Vital signs reviewed and stable  Post vital signs: Reviewed and stable  Complications: No apparent anesthesia complications

## 2013-11-16 NOTE — Anesthesia Postprocedure Evaluation (Signed)
Anesthesia Post Note  Patient: [name]  Procedure(s) Performed: Procedure(s) (LRB): ATRIAL FLUTTER ABLATION (N/A)  Anesthesia type: MAC  Patient location: PACU  Post pain: Pain level controlled  Post assessment: Patient's Cardiovascular Status Stable  Last Vitals:  Filed Vitals:   11/16/13 0432  BP: 91/50  Pulse: 78  Temp: 36.8 C  Resp: 22    Post vital signs: Reviewed and stable  Level of consciousness: sedated  Complications: No apparent anesthesia complications

## 2013-11-16 NOTE — Interval H&P Note (Signed)
History and Physical Interval Note:  11/16/2013 11:47 AM  [name]  has presented today for surgery, with the diagnosis of aflut  The various methods of treatment have been discussed with the patient and family. After consideration of risks, benefits and other options for treatment, the patient has consented to  EP study and catheter ablation of SVT as a surgical intervention .  The patient's history has been reviewed, patient examined, no change in status, stable for surgery.  I have reviewed the patient's chart and labs.  Questions were answered to the patient's satisfaction.     Leonia ReevesGregg Lindsay Straka,M.D.

## 2013-11-16 NOTE — Progress Notes (Signed)
SUBJECTIVE: The patient had tachycardia on telemetry last night.  This appears to be PMT at 130 bpm range.  I do not see that she had further SVT.  At this time, she denies chest pain, shortness of breath, or any new concerns.  Marland Kitchen aspirin EC  81 mg Oral Daily  . docusate sodium  100 mg Oral Daily  . fluticasone  1 puff Inhalation BID  . furosemide  20 mg Oral QODAY  . gabapentin  300 mg Oral TID  . levothyroxine  50 mcg Oral QAC breakfast  . magnesium gluconate  250 mg Oral BID  . metoprolol succinate  25 mg Oral Daily  . mexiletine  300 mg Oral TID  . ranolazine  1,000 mg Oral BID  . sodium chloride  3 mL Intravenous Q12H  . ursodiol  150 mg Oral TID      OBJECTIVE: Physical Exam: Filed Vitals:   11/15/13 1915 11/15/13 2007 11/16/13 0009 11/16/13 0432  BP:  110/84 92/49 91/50   Pulse:  79 76 78  Temp:  97.8 F (36.6 C) 97.7 F (36.5 C) 98.2 F (36.8 C)  TempSrc:  Oral Oral Oral  Resp:  18 22 22   Height:      Weight:      SpO2: 98% 98% 98% 97%    Intake/Output Summary (Last 24 hours) at 11/16/13 0837 Last data filed at 11/16/13 0433  Gross per 24 hour  Intake    560 ml  Output    475 ml  Net     85 ml    Telemetry as above  GEN- The patient is anxious appearing, alert and oriented x 3 today.   Head- normocephalic, atraumatic Eyes-  Sclera clear, conjunctiva pink Ears- hearing intact Oropharynx- clear Neck- supple  Lungs- Clear to ausculation bilaterally, normal work of breathing Heart- Regular rate and rhythm, 2/6 SEM LUSB GI- soft, NT, ND, + BS Extremities- no clubbing, cyanosis, or edema Skin- ICD pocket is well healed Neuro- strength and sensation are intact  LABS: Basic Metabolic Panel:  Recent Labs  16/10/96 1625 11/15/13 0435  NA 138 138  K 4.5 3.8  CL 97 101  CO2 27 24  GLUCOSE 130* 98  BUN 7 10  CREATININE 0.67 0.73  CALCIUM 9.5 9.0  MG 2.1  --    CBC:  Recent Labs  11/14/13 1645  WBC 10.9*  NEUTROABS 8.1*  HGB 14.1  HCT  43.1  MCV 86.0  PLT 424*   Cardiac Enzymes:  Recent Labs  11/14/13 1625  TROPONINI <0.30   Thyroid Function Tests:  Recent Labs  11/14/13 2006  TSH 0.561   Anemia Panel: No results found for this basename: VITAMINB12, FOLATE, FERRITIN, TIBC, IRON, RETICCTPCT,  in the last 72 hours  RADIOLOGY: Dg Chest Port 1 View  11/14/2013   CLINICAL DATA:  Defib firing  EXAM: PORTABLE CHEST - 1 VIEW  COMPARISON:  DG CHEST 2 VIEW dated 10/10/2013  FINDINGS: Low lung volumes. The cardiac silhouette is enlarged. Patient is status post median sternotomy. A left chest wall dual chamber AICD is appreciated lead tips project in the region of the right atrium right ventricle. Lungs are clear. Osseous structures unremarkable.  IMPRESSION: No active disease.   Electronically Signed   By: Salome Holmes M.D.   On: 11/14/2013 17:12    ASSESSMENT AND PLAN:  Active Problems:   Ventricular tachyarrhythmia  1. SVT Likely Atach Dr Ladona Ridgel and I have discussed.  We will plan to  proceed with ablation. Therapeutic strategies for supraventricular tachycardia including medicine and ablation were discussed in detail with the patient today. Risk, benefits, and alternatives to EP study and radiofrequency ablation were also discussed in detail today. These risks include but are not limited to stroke, bleeding, vascular damage, tamponade, perforation, damage to the heart and other structures, AV block, lead dislodgement, worsening renal function, and death. The patient understands these risk and wishes to proceed.  We will therefore proceed with catheter ablation at the next available time.  2. HCM Stable No change required today  3. VF Stable No change required today She has had PMT on telemetry.  We will see if we can program around this.  Hillis RangeJames Thessaly Mccullers, MD 11/16/2013 8:37 AM

## 2013-11-16 NOTE — Op Note (Signed)
Karen, [name] NO.:  [id number]  MEDICAL RECORD NO.:  [id number]  LOCATION:  2W06C                        FACILITY:  [facility]  PHYSICIAN:  Karen Canning. Ladona Ridgel, MD    DATE OF BIRTH:  07/12/1971  DATE OF PROCEDURE:  11/16/2013 DATE OF DISCHARGE:                              OPERATIVE REPORT   PROCEDURE PERFORMED:  Electrophysiologic study and RF catheter ablation of atrial flutter utilizing 3D electroanatomic mapping.  INTRODUCTION:  The patient is a 43 year old woman with a history of recurrent ventricular tachycardia status post ICD implantation and a remote history of hypertrophic cardiomyopathy status post surgical resection.  The patient has been well controlled from a ventricular arrhythmia perspective.  Several months ago, she received an ICD shock for what appeared to be an atrial tachycardia at a rate of 200 beats per minute.  She had one-to-one AV conduction.  The patient was admitted to the hospital several days ago with recurrent tachycardia and recurrent ICD shocks.  She was subsequently found on interrogation of her device to have what appeared to be an atrial tachycardia with one-to-one AV conduction.  She is now referred for electrophysiologic study and catheter ablation of her tachycardia.  DESCRIPTION OF PROCEDURE:  After informed consent was obtained, the patient was taken to the diagnostic EP lab in a fasting state.  After usual preparation and draping, intravenous fentanyl, Versed, and Precedex were given under the direction of the Anesthesia Service.  A 6- [place] hexapolar catheter was inserted percutaneously into the right jugular vein and advanced to the coronary sinus.  A 6-French quadripolar catheter was inserted percutaneously in the right femoral vein and advanced to the right ventricle.  A 6-French quadripolar catheter was inserted percutaneously in the right femoral vein and advanced to the His bundle region.  After measurement of  the basic intervals, rapid ventricular pacing was carried out from the right ventricle which demonstrated VA dissociation at 600 milliseconds.  Programed ventricular stimulation was carried out, demonstrating VA dissociation at 600 milliseconds.  Next, programed atrial stimulation was carried out from the atrium at a base drive cycle length of 161 milliseconds.  The S1-S2 interval stepwise decreased down to 260 milliseconds where atrial refractoriness was demonstrated.  During programed atrial stimulation, there was rare AH jump, but no echo beat, no inducible SVT, and the PR interval was greater than the RR interval.  Next, rapid atrial pacing was carried out from the atrium at a pacing cycle length of 600 milliseconds and stepwise decreased down to 340 milliseconds where AV Wenckebach was observed.  During rapid atrial pacing, the PR interval was again greater than the RR interval, but there was no inducible SVT. Additional programed atrial stimulation was carried out at a base drive cycle length of 096 milliseconds.  The S1-S2 interval stepwise decreased down to 250 milliseconds where atrial refractoriness was observed. During programed atrial stimulation, there were rare AH jumps, but no echo beats and no inducible SVT.  At this point, additional rapid atrial pacing was carried out at a pacing cycle length of 309 milliseconds and stepwise decreased down to 290 milliseconds where SVT was initiated. This SVT turned out to be atrial  flutter at a rate of 200 beats per minute.  There was 2:1 AV conduction in contrast to the patient's 1:1 AV conduction while she was at home.  Mapping was carried out with 3D electroanatomic mapping.  The mapping of the patient's right atrium demonstrated that the activation sequence was counterclockwise around the tricuspid valve, encompassing the entire length of the atrial circuit.  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  A 7-French quadripolar ablation catheter was then advanced after having undergone mapping of the atrial flutter circuit into the region of the tricuspid valve annulus.  A single RF energy application was delivered, resulted in termination of the atrial flutter and restoration of sinus rhythm. Multiple additional RF energy applications were then carried out, resulted in the creation of atrial flutter isthmus block, with the stimulus to atrial activation time locally in the flutter isthmus of 150 milliseconds.  At this point, the patient was observed.  An additional rapid atrial pacing was carried out and there was no inducible atrial flutter or any other inducible SVTs.  The catheters were removed and the patient was returned to the recovery area for removal of her sheaths.  COMPLICATIONS:  There were no immediate procedure complications.  RESULTS:  A.  Baseline ECG.  Baseline ECG demonstrates sinus rhythm with atrial pacing. B.  Baseline intervals.  The sinus node cycle length was 776 milliseconds.  The QRS duration was 176 milliseconds.  The HV interval was 55 milliseconds and the PR interval was over 200 milliseconds. C.  Rapid ventricular pacing.  Rapid ventricular pacing demonstrated VA dissociation at 600 milliseconds. D.  Programed ventricular stimulation.  Programed ventricular stimulation demonstrated VA dissociation at 600 milliseconds. E.  Programed atrial stimulation.  Programed atrial stimulation was carried out at a base drive cycle length of 960 as well as 600 milliseconds and the S1-S2 interval stepwise decreased down to 260 milliseconds where atrial refractoriness was observed.  During programed atrial stimulation, there were AH jumps, but no echo beats and no inducible SVT. F.  Rapid atrial pacing.  Rapid atrial pacing was carried out from the atrium at base drive cycle length of 454 milliseconds and stepwise decreased down to 340  milliseconds where AV Wenckebach was observed. Additional decrements down to 290 milliseconds resulted in the initiation of atrial flutter.  Following catheter ablation, additional decrements were carried out down to 240 milliseconds with no inducible SVT and no inducible atrial flutter. G.  Arrhythmias observed. 1. Atrial flutter initiation was with rapid atrial pacing.  The     duration was sustained.  Termination was with catheter ablation.     Cycle length was 300 milliseconds.     a.     Mapping.  Mapping of atrial flutter isthmus was very small      in size and normally oriented.     b.     RF energy application.  A total of 10 RF energy applications      were delivered with the first RF energy application resulted in      isthmus block.  Eight additional RF energy applications were      delivered, resulting in stemmed A interval of 150 milliseconds,      and a bonus RF energy application was delivered.  CONCLUSION:  This study demonstrates successful electrophysiologic study and RF catheter ablation of atrial flutter with the atrial flutter cycle length identical to the patient's clinical tachycardia cycle length. The difference being that she  had 1:1 activation at home and 2:1 activation under sedation in the electrophysiology laboratory. Successful ablation was carried out, rendering the tachycardia noninducible.     Karen CanningGregg W. Ladona Ridgelaylor, MD     GWT/MEDQ  D:  11/16/2013  T:  11/16/2013  Job:  [id number]

## 2013-11-16 NOTE — Anesthesia Preprocedure Evaluation (Addendum)
Anesthesia Evaluation  Patient identified by MRN, date of birth, ID band Patient awake    Reviewed: Allergy & Precautions, H&P , NPO status , Patient's Chart, lab work & pertinent test results  History of Anesthesia Complications Negative for: history of anesthetic complications  Airway Mallampati: II TM Distance: >3 FB Neck ROM: Full    Dental  (+) Teeth Intact, Dental Advisory Given   Pulmonary asthma , former smoker,  breath sounds clear to auscultation        Cardiovascular +CHF + Cardiac Defibrillator Rate:Tachycardia  Hypertrophic cardiomyopathy ef 30-35%   Neuro/Psych  Headaches, Anxiety    GI/Hepatic negative GI ROS, Neg liver ROS,   Endo/Other  Hypothyroidism Morbid obesity  Renal/GU negative Renal ROS     Musculoskeletal   Abdominal   Peds  Hematology   Anesthesia Other Findings   Reproductive/Obstetrics                          Anesthesia Physical Anesthesia Plan  ASA: III  Anesthesia Plan: MAC   Post-op Pain Management:    Induction: Intravenous  Airway Management Planned: Simple Face Mask  Additional Equipment:   Intra-op Plan:   Post-operative Plan: Extubation in OR  Informed Consent: I have reviewed the patients History and Physical, chart, labs and discussed the procedure including the risks, benefits and alternatives for the proposed anesthesia with the patient or authorized representative who has indicated his/her understanding and acceptance.   Dental advisory given  Plan Discussed with: CRNA, Anesthesiologist and Surgeon  Anesthesia Plan Comments:        Anesthesia Quick Evaluation

## 2013-11-16 NOTE — Progress Notes (Signed)
Called to see patient for groin pain and swelling at procedure site. She has had additional pressure held. Her groin is soft and I do not see any hematoma or ecchymosis. She is c/o nausea. Will give iv saline and zofran. Will check in the morning. Continue bed rest.  Lewayne BuntingGregg Adonis Ryther, M.D.

## 2013-11-16 NOTE — Progress Notes (Signed)
Patient vomited approx 100cc clear/green emisis with food particles. Pt had nausea ealier today after ablation. MD paged.

## 2013-11-17 NOTE — ED Provider Notes (Signed)
I saw and evaluated the patient, reviewed the resident's note and I agree with the findings and plan.   Agree with EKG interpretation if present.   Pt with AICD for hypertrophic cardiomyopathy has had several AICD firings this afternoon as well as 2 in the ED for vtach. Dr. Johney FrameAllred to the ED to evaluate. She has allergy to Amiodarone. Already on several other antidysrthymics.    Khyler Eschmann B. Bernette MayersSheldon, MD 11/17/13 1130

## 2013-11-17 NOTE — Progress Notes (Signed)
Ambulated 300 ft independently with RN standby. Returned to room w/out incidence. Call bell and family near.Karen LeversBaldwin, Randel Hargens E

## 2013-11-17 NOTE — Discharge Summary (Signed)
ELECTROPHYSIOLOGY DISCHARGE SUMMARY    Patient ID: [name],  MRN: [id number], DOB/AGE: [birth year] 43 y.o.  Admit date: 11/14/2013 Discharge date: 11/17/2013  Primary Care Physician: Eustaquio Boyden, MD Primary Cardiologist: Excell Seltzer, MD Primary EP: Ladona Ridgel, MD  Primary Discharge Diagnosis:  1. Atrial flutter s/p RF ablation 2. Inappropriate ICD shocks  Secondary Discharge Diagnoses:  1. Hypertrophic CM s/p myomectomy 2. Paroxysmal VT s/p unsuccessful ablation at [facility] 3. Paroxysmal atrial fibrillation 4. Chronic combined systolic and diastolic HF 5. Asthma 6. Anxiety 7. Chronic back pain  Procedures This Admission:  1. EPS +RF ablation of atrial flutter 11/16/2013 RESULTS:  A. Baseline ECG. Baseline ECG demonstrates sinus rhythm with  atrial pacing.  B. Baseline intervals. The sinus node cycle length was 776  milliseconds. The QRS duration was 176 milliseconds. The HV interval  was 55 milliseconds and the PR interval was over 200 milliseconds.  C. Rapid ventricular pacing. Rapid ventricular pacing demonstrated VA  dissociation at 600 milliseconds.  D. Programed ventricular stimulation. Programed ventricular  stimulation demonstrated VA dissociation at 600 milliseconds.  E. Programed atrial stimulation. Programed atrial stimulation was  carried out at a base drive cycle length of 119 as well as 600  milliseconds and the S1-S2 interval stepwise decreased down to 260  milliseconds where atrial refractoriness was observed. During programed  atrial stimulation, there were AH jumps, but no echo beats and no  inducible SVT.  F. Rapid atrial pacing. Rapid atrial pacing was carried out from the  atrium at base drive cycle length of 147 milliseconds and stepwise  decreased down to 340 milliseconds where AV Wenckebach was observed.  Additional decrements down to 290 milliseconds resulted in the  initiation of atrial flutter. Following catheter ablation, additional  decrements  were carried out down to 240 milliseconds with no inducible  SVT and no inducible atrial flutter.  G. Arrhythmias observed.  1. Atrial flutter initiation was with rapid atrial pacing. The  duration was sustained. Termination was with catheter ablation.  Cycle length was 300 milliseconds.  a. Mapping. Mapping of atrial flutter isthmus was very small  in size and normally oriented.  b. RF energy application. A total of 10 RF energy applications  were delivered with the first RF energy application resulted in  isthmus block. Eight additional RF energy applications were  delivered, resulting in stemmed A interval of 150 milliseconds,  and a bonus RF energy application was delivered.  CONCLUSION: This study demonstrates successful electrophysiologic study  and RF catheter ablation of atrial flutter with the atrial flutter cycle  length identical to the patient's clinical tachycardia cycle length.  The difference being that she had 1:1 activation at home and 2:1  activation under sedation in the electrophysiology laboratory.  Successful ablation was carried out, rendering the tachycardia  noninducible.   History and Hospital Course:  [name] is a pleasant 43 year old woman with hypertrophic CM s/p ICD implant, chronic combined systolic and diastolic HF, PAF and paroxysmal VT who presented 11/14/2013 with recurrent ICD shocks. She has had longstanding difficulty with hypertrophic CM s/p myomectomy in 2013 and has had prior VT ablation at [facility] which was unsuccessful. She has failed medical therapy with multiple antiarrhythmics. She is currently on mexiletine, ranolazine and metoprolol. On the day of admission she was in her usual state of health and went shopping at [place]. She developed tachypalpitations with associated weakness / fatigue. She sat down to rest and received an ICD shock. She received several additional ICD shocks  within minutes. She presented to [facility] for further evaluation.  Device interrogation revealed inappropriate ICD shocks for SVT. She then underwent a comprehensive EP study which revealed atrial flutter (cycle length matched that of her presenting clinical tachycardia). Therefore, RF ablation was performed for atrial flutter. Please see details as outlined above. [name] tolerated this procedure well without any immediate complication. She remains hemodynamically stable and afebrile. Her groin and right IJ access sites are intact without significant bleeding or hematoma. She has been given discharge instructions including wound care and activity restrictions. There were no changes made to her medications. She will follow-up with Dr. Ladona Ridgel in clinic in 4 weeks. She has been seen, examined and deemed stable for discharge today by Dr. Lewayne Bunting.  Discharge Vitals: Blood pressure 106/71, pulse 75, temperature 98.6 F (37 C), temperature source Oral, resp. rate 18, height 5' (1.524 m), weight 209 lb 14.1 oz (95.2 kg), SpO2 95.00%.   Labs: Lab Results  Component Value Date   WBC 7.5 11/16/2013   HGB 12.6 11/16/2013   HCT 39.2 11/16/2013   MCV 87.1 11/16/2013   PLT 307 11/16/2013     Recent Labs Lab 11/15/13 0435 11/16/13 1555  NA 138  --   K 3.8  --   CL 101  --   CO2 24  --   BUN 10  --   CREATININE 0.73 0.72  CALCIUM 9.0  --   GLUCOSE 98  --    Lab Results  Component Value Date   CKTOTAL 77 03/10/2012   CKMB 14.6* 03/10/2012   TROPONINI <0.30 11/14/2013     Disposition:  The patient is being discharged in stable condition.  Follow-up:     Follow-up Information   Follow up with Tonny Bollman, MD On 12/03/2013. (At 10:30 AM)    Specialty:  Cardiology   Contact information:   [address] [place] [address] [phone]       Follow up with Lewayne Bunting, MD On 12/28/2013. (At 3:15 PM)    Specialty:  Cardiology   Contact information:   [address] [place] [address] [phone]       Discharge Medications:    Medication List    STOP taking these medications       SALONPAS EX      TAKE these medications       acetaminophen 500 MG tablet  Commonly known as:  TYLENOL  Take 500-1,000 mg by mouth 2 (two) times daily as needed (pain).     albuterol 108 (90 BASE) MCG/ACT inhaler  Commonly known as:  PROVENTIL HFA;VENTOLIN HFA  Inhale 1 puff into the lungs 3 (three) times daily as needed for wheezing or shortness of breath.     ALPRAZolam 1 MG tablet  Commonly known as:  XANAX  Take 1 mg by mouth 2 (two) times daily as needed for anxiety.     aspirin EC 81 MG tablet  Take 81 mg by mouth daily.     beclomethasone 80 MCG/ACT inhaler  Commonly known as:  QVAR  Inhale 1 puff into the lungs 2 (two) times daily.     cetirizine 10 MG tablet  Commonly known as:  ZYRTEC  Take 10 mg by mouth daily.     furosemide 20 MG tablet  Commonly known as:  LASIX  Take 1 tablet (20 mg total) by mouth every other day.     gabapentin 300 MG capsule  Commonly known  as:  NEURONTIN  Take 1 capsule (300 mg total) by mouth 3 (three) times daily.     levothyroxine 50 MCG tablet  Commonly known as:  SYNTHROID, LEVOTHROID  Take 50 mcg by mouth daily before breakfast.     magnesium gluconate 500 MG tablet  Commonly known as:  MAGONATE  Take 250 mg by mouth 2 (two) times daily.     metoprolol succinate 25 MG 24 hr tablet  Commonly known as:  TOPROL-XL  Take 12.5 mg by mouth daily.     mexiletine 150 MG capsule  Commonly known as:  MEXITIL  Take 2 capsules (300 mg total) by mouth 3 (three) times daily.     oxyCODONE-acetaminophen 5-325 MG per tablet  Commonly known as:  PERCOCET/ROXICET  Take 1 tablet by mouth 3 (three) times daily as needed for severe pain (pain).     polyethylene glycol powder powder  Commonly known as:  GLYCOLAX/MIRALAX  Take 17 g by mouth daily as needed (constipation).     Potassium Gluconate 595 MG Caps  Take 595 mg by mouth daily.      ranolazine 1000 MG SR tablet  Commonly known as:  RANEXA  Take 1,000 mg by mouth 2 (two) times daily.     STOOL SOFTENER PO  Take 1 capsule by mouth daily.     ursodiol 250 MG tablet  Commonly known as:  ACTIGALL  Take 1 tablet (250 mg total) by mouth 3 (three) times daily.       Duration of Discharge Encounter: Greater than 30 minutes including physician time.  Signed, Rick DuffDMISTEN, BROOKE, PA-C 11/17/2013, 11:56 AM  EP Attending  Patient seen and examined. Agree with above. Ok for discharge. No change in meds.  Leonia ReevesGregg Leemon Ayala,M.D.

## 2013-11-17 NOTE — Discharge Instructions (Signed)
° °  GROIN  AND NECK SITE CARE INSTRUCTIONS  No driving for 3 days. No lifting over 5 lbs for 1 week. No sexual activity for 1 week. Keep procedure site clean & dry. If you notice increased pain, swelling, bleeding or pus, call/return!  You may shower, but no soaking baths/hot tubs/pools for 1 week.

## 2013-11-17 NOTE — Progress Notes (Signed)
Patient: [name] Date of Encounter: 11/17/2013, 8:44 AM Admit date: 11/14/2013     Subjective  [name] reports nausea this AM and emesis x 1. She denies CP, SOB or palpitations.    Objective  Physical Exam: Vitals: BP 106/68  Pulse 75  Temp(Src) 98.6 F (37 C) (Oral)  Resp 18  Ht 5' (1.524 m)  Wt 209 lb 14.1 oz (95.2 kg)  BMI 40.99 kg/m2  SpO2 95% General: Well developed, well appearing 43 year old female in no acute distress. Neck: Supple. JVD not elevated. Right IJ access site intact without bleeding or hematoma. Lungs: Clear bilaterally to auscultation without wheezes, rales, or rhonchi. Breathing is unlabored. Heart: RRR S1 S2 without murmurs, rubs, or gallops.  Abdomen: Soft, non-distended. Extremities: No clubbing or cyanosis. No edema.  Distal pedal pulses are 2+ and equal bilaterally. Groin site intact without bleeding or hematoma. Neuro: Alert and oriented X 3. Moves all extremities spontaneously. No focal deficits.  Intake/Output:  Intake/Output Summary (Last 24 hours) at 11/17/13 0844 Last data filed at 11/16/13 1937  Gross per 24 hour  Intake    980 ml  Output    400 ml  Net    580 ml    Inpatient Medications:  . aspirin EC  81 mg Oral Daily  . docusate sodium  100 mg Oral Daily  . fluticasone  1 puff Inhalation BID  . furosemide  20 mg Oral QODAY  . gabapentin  300 mg Oral TID  . heparin subcutaneous  5,000 Units Subcutaneous 3 times per day  . levothyroxine  50 mcg Oral QAC breakfast  . magnesium gluconate  250 mg Oral BID  . metoprolol succinate  25 mg Oral Daily  . mexiletine  300 mg Oral TID  . ranolazine  1,000 mg Oral BID  . ranolazine  1,000 mg Oral NOW  . sodium chloride  3 mL Intravenous Q12H  . sodium chloride  3 mL Intravenous Q12H  . ursodiol  150 mg Oral TID    Labs:  Recent Labs  11/14/13 1625 11/15/13 0435 11/16/13 1555  NA 138 138  --   K 4.5 3.8  --   CL 97 101  --   CO2 27 24  --   GLUCOSE 130* 98  --   BUN  7 10  --   CREATININE 0.67 0.73 0.72  CALCIUM 9.5 9.0  --   MG 2.1  --   --     Recent Labs  11/14/13 1645 11/16/13 1555  WBC 10.9* 7.5  NEUTROABS 8.1*  --   HGB 14.1 12.6  HCT 43.1 39.2  MCV 86.0 87.1  PLT 424* 307    Recent Labs  11/14/13 1625  TROPONINI <0.30    Recent Labs  11/14/13 2006  TSH 0.561    Radiology/Studies: Dg Chest Port 1 View  11/14/2013   CLINICAL DATA:  Defib firing  EXAM: PORTABLE CHEST - 1 VIEW  COMPARISON:  DG CHEST 2 VIEW dated 10/10/2013  FINDINGS: Low lung volumes. The cardiac silhouette is enlarged. Patient is status post median sternotomy. A left chest wall dual chamber AICD is appreciated lead tips project in the region of the right atrium right ventricle. Lungs are clear. Osseous structures unremarkable.  IMPRESSION: No active disease.   Electronically Signed   By: [name] M.D.   On: 11/14/2013 17:12   Telemetry: SR    Assessment and Plan   1. SVT - s/p EPS +RF  ablation of atrial flutter yesterday - doing well post ablation; nausea, question if related to anesthesia yesterday - will ambulate and plan for DC home today  2. [facility]ox[facility] - [facility]  - [facility] BB  3. HCM s/p myomectomy 2013 - metoprolol up-titrated while here  Signed, EDMISTEN, BROOKE PA-C  EP Attending  Patient seen and examined. Agree with above. She has had some nausea post ablation which I think is residual from her sedation. I expect this to resolve spontaneously.  [name],M.D.

## 2013-11-17 NOTE — Progress Notes (Signed)
Patient nauseated this am. Appears anxious at this time. Zofran given for nausea. Assessed per flow sheet. Right groin and subclavian dressing is dry/intact. VSS. Will continue to monitor. Call bell near.Karen LeversBaldwin, [name]

## 2013-11-19 ENCOUNTER — Telehealth: Payer: Self-pay | Admitting: Cardiovascular Disease

## 2013-11-19 NOTE — Telephone Encounter (Signed)
Returned call to patient she stated she needed Dr.Cooper to write a letter to take to her disability lawyer explaining her heart condition and why she is unable to work.Message sent to Dr.Cooper.

## 2013-11-19 NOTE — Telephone Encounter (Signed)
New message    Patient is unemployed at this time . Unable to pay for medical records . The social security disability attorney stated if MD can write a note stating her condition .

## 2013-11-22 ENCOUNTER — Other Ambulatory Visit: Payer: Self-pay

## 2013-11-22 MED ORDER — METOPROLOL SUCCINATE ER 25 MG PO TB24: 25.0000 mg | ORAL_TABLET | Freq: Every day | ORAL | Status: DC

## 2013-11-24 ENCOUNTER — Encounter: Payer: Self-pay | Admitting: Cardiovascular Disease

## 2013-11-24 NOTE — Telephone Encounter (Signed)
Left message for patient informing her Dr. Excell Seltzerooper has completed the letter she asked for and to call me regarding whom/where to send.

## 2013-11-27 ENCOUNTER — Telehealth: Payer: Self-pay | Admitting: Cardiology

## 2013-11-27 ENCOUNTER — Emergency Department (HOSPITAL_COMMUNITY)
Admission: EM | Admit: 2013-11-27 | Discharge: 2013-11-27 | Disposition: A | Discharge status: Home / Self Care | Payer: BC Managed Care – PPO | Attending: Emergency Medicine | Admitting: Emergency Medicine

## 2013-11-27 ENCOUNTER — Encounter (HOSPITAL_COMMUNITY): Payer: Self-pay | Admitting: Emergency Medicine

## 2013-11-27 ENCOUNTER — Emergency Department (HOSPITAL_COMMUNITY): Payer: BC Managed Care – PPO

## 2013-11-27 DIAGNOSIS — I4891 Unspecified atrial fibrillation: Secondary | ICD-10-CM | POA: Insufficient documentation

## 2013-11-27 DIAGNOSIS — Z8719 Personal history of other diseases of the digestive system: Secondary | ICD-10-CM | POA: Insufficient documentation

## 2013-11-27 DIAGNOSIS — F411 Generalized anxiety disorder: Secondary | ICD-10-CM | POA: Insufficient documentation

## 2013-11-27 DIAGNOSIS — Z7982 Long term (current) use of aspirin: Secondary | ICD-10-CM | POA: Insufficient documentation

## 2013-11-27 DIAGNOSIS — Z87891 Personal history of nicotine dependence: Secondary | ICD-10-CM | POA: Insufficient documentation

## 2013-11-27 DIAGNOSIS — R002 Palpitations: Secondary | ICD-10-CM | POA: Insufficient documentation

## 2013-11-27 DIAGNOSIS — Z95810 Presence of automatic (implantable) cardiac defibrillator: Secondary | ICD-10-CM | POA: Insufficient documentation

## 2013-11-27 DIAGNOSIS — Z862 Personal history of diseases of the blood and blood-forming organs and certain disorders involving the immune mechanism: Secondary | ICD-10-CM | POA: Insufficient documentation

## 2013-11-27 DIAGNOSIS — Z79899 Other long term (current) drug therapy: Secondary | ICD-10-CM | POA: Insufficient documentation

## 2013-11-27 DIAGNOSIS — I4892 Unspecified atrial flutter: Secondary | ICD-10-CM | POA: Insufficient documentation

## 2013-11-27 DIAGNOSIS — E039 Hypothyroidism, unspecified: Secondary | ICD-10-CM | POA: Insufficient documentation

## 2013-11-27 DIAGNOSIS — Z9889 Other specified postprocedural states: Secondary | ICD-10-CM | POA: Insufficient documentation

## 2013-11-27 DIAGNOSIS — Z872 Personal history of diseases of the skin and subcutaneous tissue: Secondary | ICD-10-CM | POA: Insufficient documentation

## 2013-11-27 DIAGNOSIS — IMO0002 Reserved for concepts with insufficient information to code with codable children: Secondary | ICD-10-CM | POA: Insufficient documentation

## 2013-11-27 DIAGNOSIS — Z86718 Personal history of other venous thrombosis and embolism: Secondary | ICD-10-CM | POA: Insufficient documentation

## 2013-11-27 DIAGNOSIS — J45901 Unspecified asthma with (acute) exacerbation: Secondary | ICD-10-CM | POA: Insufficient documentation

## 2013-11-27 DIAGNOSIS — Z8619 Personal history of other infectious and parasitic diseases: Secondary | ICD-10-CM | POA: Insufficient documentation

## 2013-11-27 DIAGNOSIS — G8929 Other chronic pain: Secondary | ICD-10-CM | POA: Insufficient documentation

## 2013-11-27 DIAGNOSIS — I5042 Chronic combined systolic (congestive) and diastolic (congestive) heart failure: Secondary | ICD-10-CM | POA: Insufficient documentation

## 2013-11-27 DIAGNOSIS — G43809 Other migraine, not intractable, without status migrainosus: Secondary | ICD-10-CM | POA: Insufficient documentation

## 2013-11-27 DIAGNOSIS — R42 Dizziness and giddiness: Secondary | ICD-10-CM | POA: Insufficient documentation

## 2013-11-27 LAB — BASIC METABOLIC PANEL
BUN: 14 mg/dL (ref 6–23)
CALCIUM: 9.1 mg/dL (ref 8.4–10.5)
CO2: 24 mEq/L (ref 19–32)
Chloride: 99 mEq/L (ref 96–112)
Creatinine, Ser: 0.73 mg/dL (ref 0.50–1.10)
GFR calc non Af Amer: >90 mL/min (ref >90)
GLUCOSE: 132 mg/dL — AB (ref 70–99)
Potassium: 4 mEq/L (ref 3.7–5.3)
SODIUM: 136 meq/L — AB (ref 137–147)

## 2013-11-27 LAB — CBC
HCT: 41.4 % (ref 36.0–46.0)
HEMOGLOBIN: 13.4 g/dL (ref 12.0–15.0)
MCH: 28 pg (ref 26.0–34.0)
MCHC: 32.4 g/dL (ref 30.0–36.0)
MCV: 86.6 fL (ref 78.0–100.0)
PLATELETS: 283 10*3/uL (ref 150–400)
RBC: 4.78 MIL/uL (ref 3.87–5.11)
RDW: 14.6 % (ref 11.5–15.5)
WBC: 5.1 10*3/uL (ref 4.0–10.5)

## 2013-11-27 LAB — I-STAT TROPONIN, ED: TROPONIN I, POC: 0.05 ng/mL (ref 0.00–0.08)

## 2013-11-27 LAB — PRO B NATRIURETIC PEPTIDE: Pro B Natriuretic peptide (BNP): 494.6 pg/mL — ABNORMAL HIGH (ref 0–125)

## 2013-11-27 NOTE — ED Notes (Signed)
[employer] contacted about pacemaker interrogation.

## 2013-11-27 NOTE — Discharge Instructions (Signed)
Make an appointment with your Cardiologist for follow up. Continue your current medication regimen as directed. Return to emergency department if you develop worsening heart palpitations, Shortness of breath, or chest pain.

## 2013-11-27 NOTE — ED Notes (Signed)
Pt reports having recent cardiac ablation due to atrial fib, now having palpitations, feels like heart is racing, has been as high as 130 pta,  HR is 100 at triage. Reports some mid chest heaviness with sob. ekg done at triage. Airway intact.

## 2013-11-27 NOTE — Telephone Encounter (Signed)
Received page from answering service. Ms. [name] called to report palpitations. I returned her call to the number provided; however, I did not get an answer. I left a voicemail message for Ms. Carras to return the call. I also called the answering service and requested that her phone number be verified should she call again.

## 2013-11-27 NOTE — ED Provider Notes (Signed)
CSN: [id number]     Arrival date & time 11/27/13  1013 History   First MD Initiated Contact with Patient 11/27/13 1104     Chief Complaint  Patient presents with  . Chest Pain  . Tachycardia  . Shortness of Breath     (Consider location/radiation/quality/duration/timing/severity/associated sxs/prior Treatment) Patient is a 43 y.o. female presenting with chest pain and shortness of breath.  Chest Pain Associated symptoms: shortness of breath   Shortness of Breath Associated symptoms: chest pain    43 yo female presents with elevated HR that started at 930am today. Patient just had cardiac ablation 1.5 weeks ago. Patient states she has a pacemaker that is set at 75 bpm. Patient states her HR has been above 100 bpm all morning. Patient admits to "chest heaviness" and some SOB that is associated with her tachycardia. Patient admits to intermittent palpitations about 5-6 times since this morning. Patient admits to lightheadedness. Denies chest pain, N/V, HA, diaphoresis, fever.  PMH significant for hypertrophic cardiomyopathy, DVT, A.fib, CHF, V.fib, and Pacemaker/defibrillator placement. [employer] is the brand or patient's pacemaker.     Past Medical History  Diagnosis Date  . Hypertrophic cardiomyopathy     s/p septal myomectomy with implantable defibrillator 01/27/2012   . Anxiety state, unspecified   . Allergic rhinitis     to dogs  . Asthma   . Anemia     during pregnancy  . History of chicken pox   . Generalized headaches   . Ocular migraine     no HA  . Back pain     told cracked bone, vicodin didn't help, improved with percocets/muscle relaxants, no eval by prior PCP  . 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 DVT (deep venous thrombosis) 01/2012    was placed on Warfarin after cardiac surgery but had skin reaction to warfarin so was changed to Pradaxa so DVT occurred while on Pradaxa although unclear if it occurred  during transition  . Hypotension   . Atrial fibrillation     a. On pradaxa.  coumadin necrosis with warfarin;  b. Amio d/c'd 02/2012  . Pericardial effusion   . Chronic combined systolic and diastolic CHF (congestive heart failure)     a. echo 03/19/12: severe asymmetric septal hypertrophy, evidence of upper septal myomectomy, peak LV outflow tract gradient 35, EF 35%,;  b.  Echo 9/13: Severe LVH, EF 30-35%, anteroseptal and inferior HK, LVOT narrow, mild AI, mild to moderate mitral stenosis, severe LAE  . Pericardial tamponade 03/14/2012  . 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 01/2012 s/p BSX ICD;  b. 06/2012 Multaq initiated due to recurrent syncope, VT/VF - had severe n/v with IV amio.  . Skin necrosis --COUMADIN   . Chronic chest wall pain     eval by CVTS, stable, [facility]  . Syncope     a. in setting of VT/VF.  Marland Kitchen. Hypothyroid   . Blood transfusion without reported diagnosis   . Neuromuscular disorder   . Gall stones   . Atrial flutter 10/2013    with inappropriate ICD therapy; s/p CTI ablation by Dr Ladona Ridgelaylor 11/16/2013   Past Surgical History  Procedure Laterality Date  . Cesarean section  1991  . Induced abortion  1990 and 1993  . Cardiac surgery  01/27/2012    median sternotomy, septal myectomy, MVR - [facility] (Dr. Silvestre MesiGlower)  . Cardiac defibrillator placement  01/2012    BSX dual chamber ICD  . Myomectomy    . Pericardial window  03/14/2012    Procedure: PERICARDIAL WINDOW;  Surgeon: Purcell Nails, MD;  Location: [facility] OR;  Service: Open Heart Surgery;  Laterality: N/A;  Subxyphoid Pericardial  Window   . Ablation  11-16-13    RFCA of atrial flutter by Dr Ladona Ridgel    Family History  Problem Relation Age of Onset  . Diabetes Father   . Heart disease Father     unspecified heart problem  . Thyroid disease Mother   . Anemia Mother     mediterranean anemia, ITP  . Heart disease Maternal Grandfather     CHF  . Diabetes Paternal Grandfather   . Cancer  Paternal Grandfather   . Coronary artery disease Neg Hx   . Stroke Neg Hx   . Sudden death Neg Hx    History  Substance Use Topics  . Smoking status: Former Smoker -- 1.00 packs/day for 3 years    Types: Cigarettes    Quit date: 09/30/1988  . Smokeless tobacco: Never Used     Comment: quitin 1990  . Alcohol Use: No     Comment: none since "months" as of 02/28/2013   OB History   Grav Para Term Preterm Abortions TAB SAB Ect Mult Living                 Review of Systems  Respiratory: Positive for shortness of breath.   Cardiovascular: Positive for chest pain.  Psychiatric/Behavioral: The patient is nervous/anxious.   All other systems reviewed and are negative.      Allergies  Phenergan; Amiodarone; Nitroglycerin; and Warfarin and related  Home Medications   Current Outpatient Rx  Name  Route  Sig  Dispense  Refill  . acetaminophen (TYLENOL) 500 MG tablet   Oral   Take 500-1,000 mg by mouth 2 (two) times daily as needed (pain).          Marland Kitchen albuterol (PROVENTIL HFA;VENTOLIN HFA) 108 (90 BASE) MCG/ACT inhaler   Inhalation   Inhale 1 puff into the lungs 3 (three) times daily as needed for wheezing or shortness of breath.         . ALPRAZolam (XANAX) 1 MG tablet   Oral   Take 1 mg by mouth 2 (two) times daily as needed for anxiety.         Marland Kitchen aspirin EC 81 MG tablet   Oral   Take 81 mg by mouth daily.         . beclomethasone (QVAR) 80 MCG/ACT inhaler   Inhalation   Inhale 1 puff into the lungs 2 (two) times daily.         . cetirizine (ZYRTEC) 10 MG tablet   Oral   Take 10 mg by mouth daily.          Tery Sanfilippo Calcium (STOOL SOFTENER PO)   Oral   Take 1 capsule by mouth daily.         . furosemide (LASIX) 20 MG tablet   Oral   Take 1 tablet (20 mg total) by mouth every other day.   30 tablet      . gabapentin (NEURONTIN) 300 MG capsule   Oral   Take 1 capsule (300 mg total) by mouth 3 (three) times daily.   90 capsule   6   .  levothyroxine (SYNTHROID, LEVOTHROID) 50 MCG tablet   Oral   Take 50 mcg by  mouth daily before breakfast.          . magnesium gluconate (MAGONATE) 500 MG tablet   Oral   Take 250 mg by mouth 2 (two) times daily.         . metoprolol succinate (TOPROL-XL) 25 MG 24 hr tablet   Oral   Take 1 tablet (25 mg total) by mouth daily.   30 tablet   6   . mexiletine (MEXITIL) 150 MG capsule   Oral   Take 2 capsules (300 mg total) by mouth 3 (three) times daily.   180 capsule   6   . oxyCODONE-acetaminophen (PERCOCET/ROXICET) 5-325 MG per tablet   Oral   Take 1 tablet by mouth 3 (three) times daily as needed for severe pain (pain).         . polyethylene glycol powder (GLYCOLAX/MIRALAX) powder   Oral   Take 17 g by mouth daily as needed (constipation).          . Potassium Gluconate 595 MG CAPS   Oral   Take 595 mg by mouth daily.         . ranolazine (RANEXA) 1000 MG SR tablet   Oral   Take 1,000 mg by mouth 2 (two) times daily.          . ursodiol (ACTIGALL) 250 MG tablet   Oral   Take 1 tablet (250 mg total) by mouth 3 (three) times daily.   90 tablet   3    BP 98/68  Pulse 74  Temp(Src) 98.4 F (36.9 C) (Oral)  Resp 14  SpO2 97%  LMP 11/05/2013 Physical Exam  Nursing note and vitals reviewed. Constitutional: She is oriented to person, place, and time. She appears well-developed and well-nourished. No distress.  HENT:  Head: Normocephalic and atraumatic.  Eyes: Conjunctivae and EOM are normal. No scleral icterus.  Neck: Normal range of motion. Neck supple. No JVD present. No tracheal deviation present.  Cardiovascular: Normal rate and regular rhythm.  Exam reveals no gallop and no friction rub.   No murmur heard. Pulmonary/Chest: Effort normal. No respiratory distress. She has no wheezes. She has no rhonchi. She has no rales.  Abdominal: Soft. There is no tenderness.  Musculoskeletal: Normal range of motion. She exhibits no edema.  Neurological: She  is alert and oriented to person, place, and time.  Skin: Skin is warm and dry. No rash noted. She is not diaphoretic.  Psychiatric: Her behavior is normal. Her mood appears anxious.    ED Course  Procedures (including critical care time) Labs Review Labs Reviewed  BASIC METABOLIC PANEL - Abnormal; Notable for the following:    Sodium 136 (*)    Glucose, Bld 132 (*)    All other components within normal limits  PRO B NATRIURETIC PEPTIDE - Abnormal; Notable for the following:    Pro B Natriuretic peptide (BNP) 494.6 (*)    All other components within normal limits  CBC  I-STAT TROPOININ, ED   Imaging Review Dg Chest 2 View  11/27/2013   CLINICAL DATA:  Chest pain, pressure and sternum  EXAM: CHEST  2 VIEW  COMPARISON:  DG CHEST 1V PORT dated 11/14/2013  FINDINGS: Mild to moderate cardiac enlargement stable. Cardiac pacer unchanged. Vascular pattern normal. Lungs clear. Bony thorax intact. No pleural effusions.  IMPRESSION: Stable cardiac enlargement.  No acute findings.   Electronically Signed   By: Esperanza Heir M.D.   On: 11/27/2013 12:25     EKG Interpretation  Date/Time:  Saturday November 27 2013 12:32:56 EST Ventricular Rate:  81 PR Interval:  201 QRS Duration: 167 QT Interval:  448 QTC Calculation: 520 R Axis:   65 Text Interpretation:  Atrial-paced complexes Left bundle branch block No  significant change was found Confirmed by CAMPOS  MD, Caryn Bee ([id number]) on  11/27/2013 12:58:57 PM      MDM   Final diagnoses:  Intermittent palpitations   Patient afebrile Hypotensive . Patient asymptomatic. Improved with adjustment to pacemaker.  Patient tachycardic at admission. Resolved with adjustment to pacemaker.  Troponin negative No leukocytosis. BNP stable at 494.6 CXR stable, no acute findings. No widened mediastinum  Doubt PE, Doubt MI, CHF exacerbation, Pneumonia, or dissection.    Interrogation of pacemaker revealed that patient's device was causing her rate to  jump up every time she had a PAC. Setting of pacemaker was changed, and patient's HR returned to normal range. Patient appears comfortable in room, in NAD.   Plan to have patient follow up with her Cardiologist. Return precautions given for any worsening symptoms, dizziness,  palpitations, chest pain, or shortness of breath.   Patient discussed with Dr. Azalia Bilis.                Allen Norris [place], PA-C 11/28/13 [phone]

## 2013-11-27 NOTE — ED Notes (Signed)
PA at bedside.

## 2013-11-27 NOTE — Telephone Encounter (Signed)
Ms. Karen LandsmanStaffa returned call.  She has a significant cardiac history including HCM s/p myomectomy , paroxysmal VT s/p [facility] and PAF. She has failed medical therapy with multiple antiarrhythmic medications. She is currently taking mexiletine, ranolazine and metoprolol as per [facility] recommendations.  Currently, she is anxious as she reports "racing" palpitations, chest pressure and SOB that started about one hour ago. Her heart rate has been sustained at 115-120 bpm according to her home readings. She has already taken her AM dose of metoprolol. She denies dizziness, near syncope or syncope. She recently underwent EPS +RF ablation of atrial flutter after being admitted with inappropriate ICD shocks. She has not received any ICD shocks since hospital discharge. She is home alone and states she does not have anyone who can transport her [facility]. I instructed her to call 911 for [facility] for further evaluation. She expressed verbal understanding and agrees with plan.

## 2013-11-28 NOTE — ED Provider Notes (Signed)
Medical screening examination/treatment/procedure(s) were conducted as a shared visit with non-physician practitioner(s) and myself.  I personally evaluated the patient during the encounter.   EKG Interpretation   Date/Time:  Saturday November 27 2013 12:32:56 EST Ventricular Rate:  81 PR Interval:  201 QRS Duration: 167 QT Interval:  448 QTC Calculation: 520 R Axis:   65 Text Interpretation:  Atrial-paced complexes Left bundle branch block No  significant change was found Confirmed by Lugene Hitt  MD, Verlean Allport ([id number]) on  11/27/2013 12:58:57 PM      Changes made to her pacer as noted. Pt feels better. Outpatient referral back to her EP team.   Lyanne CoKevin M Brion Hedges, MD 11/28/13 [phone]

## 2013-12-02 NOTE — Telephone Encounter (Signed)
Patient has appointment with Dr. Excell Seltzerooper 3/6 and letter can be given to her at this time

## 2013-12-03 ENCOUNTER — Telehealth: Payer: Self-pay | Admitting: Family Medicine

## 2013-12-03 ENCOUNTER — Ambulatory Visit (INDEPENDENT_AMBULATORY_CARE_PROVIDER_SITE_OTHER): Payer: BC Managed Care – PPO | Admitting: Cardiovascular Disease

## 2013-12-03 ENCOUNTER — Encounter: Payer: Self-pay | Admitting: Cardiovascular Disease

## 2013-12-03 VITALS — BP 104/58 | HR 75 | Ht 60.0 in | Wt 210.0 lb

## 2013-12-03 DIAGNOSIS — I5042 Chronic combined systolic (congestive) and diastolic (congestive) heart failure: Secondary | ICD-10-CM

## 2013-12-03 DIAGNOSIS — I509 Heart failure, unspecified: Secondary | ICD-10-CM

## 2013-12-03 DIAGNOSIS — I422 Other hypertrophic cardiomyopathy: Secondary | ICD-10-CM

## 2013-12-03 MED ORDER — FUROSEMIDE 20 MG PO TABS: 40.0000 mg | ORAL_TABLET | Freq: Every day | ORAL | Status: DC

## 2013-12-03 NOTE — Patient Instructions (Signed)
Your physician has recommended you make the following change in your medication:   1. Increase Lasix to 40 mg once daily.  Your physician recommends that you return on the day you are scheduled to see Dr. Ladona Ridgelaylor for labs: BMET  Your physician has requested that you have an echocardiogram. Echocardiography is a painless test that uses sound waves to create images of your heart. It provides your doctor with information about the size and shape of your heart and how well your heart's chambers and valves are working. This procedure takes approximately one hour. There are no restrictions for this procedure.  Your physician wants you to follow-up in: 6 months with Dr. Excell Seltzerooper. You will receive a reminder letter in the mail two months in advance. If you don't receive a letter, please call our office to schedule the follow-up appointment.

## 2013-12-03 NOTE — Telephone Encounter (Signed)
Patient Information:  Caller Name: Arline AspCindy  Phone: [phone]  Patient: [name], Finley  Gender: Female  DOB: 05/15/1971  Age: 43 Years  PCP: Eustaquio BoydenGutierrez, [facility](Family Practice)  Pregnant: No  Office Follow Up:  Does the office need to follow up with this patient?: Yes  Instructions For The Office: Please call.  RN Note:  Pt's cardiology nurse advised her to call as she's having sx of PTSD. She had multiple ICD shocks while awake last month (02/15) She now is fearful of going out, is very jumpy. Questions if she needs to see Dr. Sharen HonesGutierrez or a psychologist.  Symptoms  Reason For Call & Symptoms: ? PTSD  Reviewed Health History In EMR: Yes  Reviewed Medications In EMR: Yes  Reviewed Allergies In EMR: Yes  Reviewed Surgeries / Procedures: Yes  Date of Onset of Symptoms: 11/15/2013 OB / GYN:  LMP: 11/30/2013  Guideline(s) Used:  No Protocol Available - Sick Adult  Disposition Per Guideline:   Discuss with PCP and Callback by Nurse Today  Reason For Disposition Reached:   Nursing judgment  Advice Given:  Call Back If:  New symptoms develop  You become worse.  Patient Will Follow Care Advice:  YES

## 2013-12-03 NOTE — Progress Notes (Signed)
HPI:  43 year old woman with hypertrophic cardiomyopathy, ventricular arrhythmias, and atrial tachycardia arrhythmias. She's had a very complex and difficult cardiac history. She still medical therapy with multiple antiarrhythmic drugs. She's had recurrent ICD shocks. She underwent a septal myectomy at [facility] a few years back but continues to struggle with symptoms of congestive heart failure and arrhythmia.  Last month she received multiple ICD shocks while she was awake. She was noted to have atrial tachycardia and she ultimately underwent radiofrequency ablation by Dr. Ladona Ridgel. She was seen again in the emergency department February 28 for tachypalpitations. Her device was interrogated and adjustments were made. She's done better since that time. She continues to have palpitations but nothing sustained. She's had no further ICD shocks. Scheduled to see Dr. Ladona Ridgel next week. She denies progressive dyspnea, chest pain, orthopnea, or PND. She does admit to increasing leg and hand swelling.  Outpatient Encounter Prescriptions as of 12/03/2013  Medication Sig  . acetaminophen (TYLENOL) 500 MG tablet Take 500-1,000 mg by mouth 2 (two) times daily as needed (pain).   Marland Kitchen albuterol (PROVENTIL HFA;VENTOLIN HFA) 108 (90 BASE) MCG/ACT inhaler Inhale 1 puff into the lungs 3 (three) times daily as needed for wheezing or shortness of breath.  . ALPRAZolam (XANAX) 1 MG tablet Take 1 mg by mouth 2 (two) times daily as needed for anxiety.  Marland Kitchen aspirin EC 81 MG tablet Take 81 mg by mouth daily.  . beclomethasone (QVAR) 80 MCG/ACT inhaler Inhale 1 puff into the lungs 2 (two) times daily.  . cetirizine (ZYRTEC) 10 MG tablet Take 10 mg by mouth daily.   Tery Sanfilippo Calcium (STOOL SOFTENER PO) Take 1 capsule by mouth daily.  . furosemide (LASIX) 20 MG tablet Take 1 tablet (20 mg total) by mouth every other day.  . gabapentin (NEURONTIN) 300 MG capsule Take 1 capsule (300 mg total) by mouth 3 (three) times daily.  Marland Kitchen  levothyroxine (SYNTHROID, LEVOTHROID) 50 MCG tablet Take 50 mcg by mouth daily before breakfast.   . magnesium gluconate (MAGONATE) 500 MG tablet Take 250 mg by mouth 2 (two) times daily.  . metoprolol succinate (TOPROL-XL) 25 MG 24 hr tablet Take 1 tablet (25 mg total) by mouth daily.  Marland Kitchen mexiletine (MEXITIL) 150 MG capsule Take 2 capsules (300 mg total) by mouth 3 (three) times daily.  Marland Kitchen oxyCODONE-acetaminophen (PERCOCET/ROXICET) 5-325 MG per tablet Take 1 tablet by mouth 3 (three) times daily as needed for severe pain (pain).  . polyethylene glycol powder (GLYCOLAX/MIRALAX) powder Take 17 g by mouth daily as needed (constipation).   . Potassium Gluconate 595 MG CAPS Take 595 mg by mouth daily.  . Probiotic Product (PROBIOTIC PO) Take by mouth daily.  . ranolazine (RANEXA) 1000 MG SR tablet Take 1,000 mg by mouth 2 (two) times daily.   . ursodiol (ACTIGALL) 250 MG tablet Take 1 tablet (250 mg total) by mouth 3 (three) times daily.    Allergies  Allergen Reactions  . Phenergan [Promethazine Hcl] Itching  . Amiodarone Nausea And Vomiting    N/V on IV amio 06/2012  . Nitroglycerin     Hypotension  . Warfarin And Related Other (See Comments)    Scars on skin    Past Medical History  Diagnosis Date  . Hypertrophic cardiomyopathy     s/p septal myomectomy with implantable defibrillator 01/27/2012   . Anxiety state, unspecified   . Allergic rhinitis     to dogs  . Asthma   . Anemia  during pregnancy  . History of chicken pox   . Generalized headaches   . Ocular migraine     no HA  . Back pain     told cracked bone, vicodin didn't help, improved with percocets/muscle relaxants, no eval by prior PCP  . 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 DVT (deep venous thrombosis) 01/2012    was placed on Warfarin after cardiac surgery but had skin reaction to warfarin so was changed to Pradaxa so DVT occurred while on Pradaxa although  unclear if it occurred during transition  . Hypotension   . Atrial fibrillation     a. On pradaxa.  coumadin necrosis with warfarin;  b. Amio d/c'd 02/2012  . Pericardial effusion   . Chronic combined systolic and diastolic CHF (congestive heart failure)     a. echo 03/19/12: severe asymmetric septal hypertrophy, evidence of upper septal myomectomy, peak LV outflow tract gradient 35, EF 35%,;  b.  Echo 9/13: Severe LVH, EF 30-35%, anteroseptal and inferior HK, LVOT narrow, mild AI, mild to moderate mitral stenosis, severe LAE  . Pericardial tamponade 03/14/2012  . 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 01/2012 s/p BSX ICD;  b. 06/2012 Multaq initiated due to recurrent syncope, VT/VF - had severe n/v with IV amio.  . Skin necrosis --COUMADIN   . Chronic chest wall pain     eval by CVTS, stable, rec return to [facility] if continued pain  . Syncope     a. in setting of VT/VF.  Marland Kitchen Hypothyroid   . Blood transfusion without reported diagnosis   . Neuromuscular disorder   . Gall stones   . Atrial flutter 10/2013    with inappropriate ICD therapy; s/p CTI ablation by Dr Ladona Ridgel 11/16/2013   ROS: Negative except as per HPI  BP 104/58  Pulse 75  Ht 5' (1.524 m)  Wt 210 lb (95.255 kg)  BMI 41.01 kg/m2  SpO2 98%  LMP 11/05/2013  PHYSICAL EXAM: Pt is alert and oriented, NAD HEENT: normal Neck: JVP - normal, carotids 2+= without bruits Lungs: CTA bilaterally CV: RRR with grade 2/6 systolic murmur at the left sternal border Abd: soft, NT, Positive BS, no hepatomegaly Ext: 1+ pedal edema bilaterally, distal pulses intact and equal Skin: warm/dry no rash  2D Echo 12/30/2012: Study Conclusions  - Left ventricle: Septal hypokinesis S/P myectomy with thinned area near basal septum The cavity size was mildly dilated. Wall thickness was increased in a pattern of moderate LVH. Systolic function was mildly to moderately reduced. The estimated ejection fraction was in the range of 40% to 45%. Diffuse  hypokinesis. - Mitral valve: S/P repair. Posterior ring prominant. trivial residual MR with mild diastolic gradient. Chrodal SAM persists but no significant LVOT gradient - Left atrium: The atrium was moderately to severely dilated. - Right atrium: The atrium was mildly dilated. - Atrial septum: No defect or patent foramen ovale was identified.  ASSESSMENT AND PLAN: 1. Hypertrophic cardiomyopathy. Currently New York Heart Association functional class 2-3. She's undergone transplant evaluation at [facility]. For now we are continuing with medical therapy. She has some evidence of volume excess on exam. I'm going to increase her furosemide to 40 mg daily. Repeat 2D echo. Study from last year reviewed. Will draw metabolic panel when she sees Dr. Ladona Ridgel in 1 week. Otherwise her medications were not changed today.  2. Atrial and ventricular tachycardia arrhythmias. Currently managed with mexiletine and  Ranexa. Has undergone ablation procedures. Sees Dr. Ladona Ridgelaylor next week.  Tonny BollmanMichael Shaheen Star 12/03/2013 11:08 AM

## 2013-12-07 NOTE — Telephone Encounter (Signed)
Pt request cb with Dr Sharen HonesGutierrez comments and advice.

## 2013-12-07 NOTE — Telephone Encounter (Signed)
suggest she come in to discuss PTSD and possible treatment options.

## 2013-12-08 ENCOUNTER — Ambulatory Visit (HOSPITAL_COMMUNITY)
Admission: RE | Admit: 2013-12-08 | Discharge: 2013-12-08 | Disposition: A | Discharge status: Home / Self Care | Payer: BC Managed Care – PPO | Source: Ambulatory Visit | Attending: Cardiovascular Disease | Admitting: Cardiovascular Disease

## 2013-12-08 DIAGNOSIS — I422 Other hypertrophic cardiomyopathy: Secondary | ICD-10-CM | POA: Insufficient documentation

## 2013-12-08 DIAGNOSIS — I059 Rheumatic mitral valve disease, unspecified: Secondary | ICD-10-CM

## 2013-12-08 NOTE — Telephone Encounter (Signed)
Spoke with patient and appt scheduled. 

## 2013-12-08 NOTE — Progress Notes (Signed)
  Echocardiogram 2D Echocardiogram has been performed.  Jorje GuildCHUNG, Melvin Marmo 12/08/2013, 8:48 AM

## 2013-12-10 ENCOUNTER — Encounter: Payer: Self-pay | Admitting: Internal Medicine

## 2013-12-10 ENCOUNTER — Other Ambulatory Visit (INDEPENDENT_AMBULATORY_CARE_PROVIDER_SITE_OTHER): Payer: BC Managed Care – PPO

## 2013-12-10 ENCOUNTER — Other Ambulatory Visit: Payer: Self-pay | Admitting: *Deleted

## 2013-12-10 ENCOUNTER — Ambulatory Visit (INDEPENDENT_AMBULATORY_CARE_PROVIDER_SITE_OTHER): Payer: BC Managed Care – PPO | Admitting: Internal Medicine

## 2013-12-10 VITALS — BP 106/75 | HR 75 | Ht 60.0 in | Wt 212.0 lb

## 2013-12-10 DIAGNOSIS — I4901 Ventricular fibrillation: Secondary | ICD-10-CM

## 2013-12-10 DIAGNOSIS — I509 Heart failure, unspecified: Secondary | ICD-10-CM

## 2013-12-10 DIAGNOSIS — I422 Other hypertrophic cardiomyopathy: Secondary | ICD-10-CM

## 2013-12-10 DIAGNOSIS — I471 Supraventricular tachycardia: Secondary | ICD-10-CM

## 2013-12-10 DIAGNOSIS — I5042 Chronic combined systolic (congestive) and diastolic (congestive) heart failure: Secondary | ICD-10-CM

## 2013-12-10 DIAGNOSIS — Z9889 Other specified postprocedural states: Principal | ICD-10-CM

## 2013-12-10 DIAGNOSIS — Z95810 Presence of automatic (implantable) cardiac defibrillator: Secondary | ICD-10-CM

## 2013-12-10 DIAGNOSIS — Z01810 Encounter for preprocedural cardiovascular examination: Secondary | ICD-10-CM

## 2013-12-10 DIAGNOSIS — Z8679 Personal history of other diseases of the circulatory system: Secondary | ICD-10-CM

## 2013-12-10 DIAGNOSIS — I498 Other specified cardiac arrhythmias: Secondary | ICD-10-CM

## 2013-12-10 LAB — BASIC METABOLIC PANEL
BUN: 18 mg/dL (ref 6–23)
CHLORIDE: 103 meq/L (ref 96–112)
CO2: 23 meq/L (ref 19–32)
Calcium: 9.3 mg/dL (ref 8.4–10.5)
Creatinine, Ser: 1 mg/dL (ref 0.4–1.2)
GFR: 63.53 mL/min (ref >60.00)
Glucose, Bld: 106 mg/dL — ABNORMAL HIGH (ref 70–99)
Potassium: 4.6 mEq/L (ref 3.5–5.1)
Sodium: 137 mEq/L (ref 135–145)

## 2013-12-10 LAB — MDC_IDC_ENUM_SESS_TYPE_INCLINIC
Date Time Interrogation Session: 20150313040000
HighPow Impedance: 40 Ohm
HighPow Impedance: 51 Ohm
Lead Channel Pacing Threshold Amplitude: 0.7 V
Lead Channel Pacing Threshold Amplitude: 1.1 V
Lead Channel Pacing Threshold Pulse Width: 0.5 ms
Lead Channel Sensing Intrinsic Amplitude: 17.2 mV
Lead Channel Setting Pacing Amplitude: 2.4 V
MDC IDC MSMT LEADCHNL RA IMPEDANCE VALUE: 609 Ohm
MDC IDC MSMT LEADCHNL RA SENSING INTR AMPL: 6.5 mV
MDC IDC MSMT LEADCHNL RV IMPEDANCE VALUE: 519 Ohm
MDC IDC MSMT LEADCHNL RV PACING THRESHOLD PULSEWIDTH: 0.5 ms
MDC IDC PG SERIAL: 102591
MDC IDC SET LEADCHNL RA PACING AMPLITUDE: 2 V
MDC IDC SET LEADCHNL RV PACING PULSEWIDTH: 0.5 ms
MDC IDC SET LEADCHNL RV SENSING SENSITIVITY: 0.6 mV
MDC IDC SET ZONE DETECTION INTERVAL: 273 ms
MDC IDC STAT BRADY RA PERCENT PACED: 86 %
MDC IDC STAT BRADY RV PERCENT PACED: 1 % — AB
Zone Setting Detection Interval: 316 ms

## 2013-12-10 MED ORDER — FUROSEMIDE 40 MG PO TABS: 40.0000 mg | ORAL_TABLET | Freq: Every day | ORAL | Status: DC

## 2013-12-10 NOTE — Progress Notes (Signed)
HPI [name] returns today for followup. She is a very pleasant 43 yo woman with HCM, s/p myomectomy, VT/VT who has been on multiple medications and had multiple ICD shocks. She came in several weeks ago with a 1:1 tachycardia and was found to have atrial flutter with an RVR. She was successfully ablated. She has had no recurrent shocks. She is considering gall bladder surgery. Her heart failure is class 2.   Allergies  Allergen Reactions  . Phenergan [Promethazine Hcl] Itching  . Amiodarone Nausea And Vomiting    N/V on IV amio 06/2012  . Nitroglycerin     Hypotension  . Warfarin And Related Other (See Comments)    Scars on skin     Current Outpatient Prescriptions  Medication Sig Dispense Refill  . acetaminophen (TYLENOL) 500 MG tablet Take 500-1,000 mg by mouth 2 (two) times daily as needed (pain).       Marland Kitchen albuterol (PROVENTIL HFA;VENTOLIN HFA) 108 (90 BASE) MCG/ACT inhaler Inhale 1 puff into the lungs 3 (three) times daily as needed for wheezing or shortness of breath.      . ALPRAZolam (XANAX) 1 MG tablet Take 1 mg by mouth 2 (two) times daily as needed for anxiety.      Marland Kitchen aspirin EC 81 MG tablet Take 81 mg by mouth daily.      . beclomethasone (QVAR) 80 MCG/ACT inhaler Inhale 1 puff into the lungs 2 (two) times daily.      . cetirizine (ZYRTEC) 10 MG tablet Take 10 mg by mouth daily.       Tery Sanfilippo Calcium (STOOL SOFTENER PO) Take 1 capsule by mouth daily.      . furosemide (LASIX) 20 MG tablet Take 2 tablets (40 mg total) by mouth daily.  60 tablet  2  . gabapentin (NEURONTIN) 300 MG capsule Take 1 capsule (300 mg total) by mouth 3 (three) times daily.  90 capsule  6  . levothyroxine (SYNTHROID, LEVOTHROID) 50 MCG tablet Take 50 mcg by mouth daily before breakfast.       . magnesium gluconate (MAGONATE) 500 MG tablet Take 250 mg by mouth 2 (two) times daily.      . metoprolol succinate (TOPROL-XL) 25 MG 24 hr tablet Take 1 tablet (25 mg total) by mouth daily.  30 tablet  6  .  mexiletine (MEXITIL) 150 MG capsule Take 2 capsules (300 mg total) by mouth 3 (three) times daily.  180 capsule  6  . oxyCODONE-acetaminophen (PERCOCET/ROXICET) 5-325 MG per tablet Take 1 tablet by mouth 3 (three) times daily as needed for severe pain (pain).      . polyethylene glycol powder (GLYCOLAX/MIRALAX) powder Take 17 g by mouth daily as needed (constipation).       . Potassium Gluconate 595 MG CAPS Take 595 mg by mouth daily.      . Probiotic Product (PROBIOTIC PO) Take by mouth daily.      . ranolazine (RANEXA) 1000 MG SR tablet Take 1,000 mg by mouth 2 (two) times daily.       . ursodiol (ACTIGALL) 250 MG tablet Take 1 tablet (250 mg total) by mouth 3 (three) times daily.  90 tablet  3   No current facility-administered medications for this visit.     Past Medical History  Diagnosis Date  . Hypertrophic cardiomyopathy     s/p septal myomectomy with implantable defibrillator 01/27/2012   . Anxiety state, unspecified   . Allergic rhinitis     to dogs  .  Asthma   . Anemia     during pregnancy  . History of chicken pox   . Generalized headaches   . Ocular migraine     no HA  . Back pain     told cracked bone, vicodin didn't help, improved with percocets/muscle relaxants, no eval by prior PCP  . 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 DVT (deep venous thrombosis) 01/2012    was placed on Warfarin after cardiac surgery but had skin reaction to warfarin so was changed to Pradaxa so DVT occurred while on Pradaxa although unclear if it occurred during transition  . Hypotension   . Atrial fibrillation     a. On pradaxa.  coumadin necrosis with warfarin;  b. Amio d/c'd 02/2012  . Pericardial effusion   . Chronic combined systolic and diastolic CHF (congestive heart failure)     a. echo 03/19/12: severe asymmetric septal hypertrophy, evidence of upper septal myomectomy, peak LV outflow tract gradient 35, EF 35%,;  b.  Echo 9/13:  Severe LVH, EF 30-35%, anteroseptal and inferior HK, LVOT narrow, mild AI, mild to moderate mitral stenosis, severe LAE  . Pericardial tamponade 03/14/2012  . 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 01/2012 s/p BSX ICD;  b. 06/2012 Multaq initiated due to recurrent syncope, VT/VF - had severe n/v with IV amio.  . Skin necrosis --COUMADIN   . Chronic chest wall pain     eval by CVTS, stable, rec return to [facility] if continued pain  . Syncope     a. in setting of VT/VF.  Marland Kitchen Hypothyroid   . Blood transfusion without reported diagnosis   . Neuromuscular disorder   . Gall stones   . Atrial flutter 10/2013    with inappropriate ICD therapy; s/p CTI ablation by Dr Ladona Ridgel 11/16/2013    ROS:   All systems reviewed and negative except as noted in the HPI.   Past Surgical History  Procedure Laterality Date  . Cesarean section  1991  . Induced abortion  1990 and 1993  . Cardiac surgery  01/27/2012    median sternotomy, septal myectomy, MVR - [facility] (Dr. Silvestre Mesi)  . Cardiac defibrillator placement  01/2012    BSX dual chamber ICD  . Myomectomy    . Pericardial window  03/14/2012    Procedure: PERICARDIAL WINDOW;  Surgeon: Purcell Nails, MD;  Location: [facility] OR;  Service: Open Heart Surgery;  Laterality: N/A;  Subxyphoid Pericardial  Window   . Ablation  11-16-13    RFCA of atrial flutter by Dr Ladona Ridgel      Family History  Problem Relation Age of Onset  . Diabetes Father   . Heart disease Father     unspecified heart problem  . Thyroid disease Mother   . Anemia Mother     mediterranean anemia, ITP  . Heart disease Maternal Grandfather     CHF  . Diabetes Paternal Grandfather   . Cancer Paternal Grandfather   . Coronary artery disease Neg Hx   . Stroke Neg Hx   . Sudden death Neg Hx      History   Social History  . Marital Status: Single    Spouse Name: N/A    Number of Children: 1  . Years of Education: N/A   Occupational History  . Naval architect    Social History Main  Topics  . Smoking status: Former Smoker -- 1.00 packs/day for  3 years    Types: Cigarettes    Quit date: 09/30/1988  . Smokeless tobacco: Never Used     Comment: quitin 1990  . Alcohol Use: No     Comment: none since "months" as of 02/28/2013  . Drug Use: No     Comment: quiti n 1989  . Sexual Activity: Yes    Birth Control/ Protection: Pill   Other Topics Concern  . Not on file   Social History Narrative   Caffeine: 1 cup coffee/day   Divorced, 1 child.  lives with boyfriend, dogs   Occupation: Full time Tax adviserfast food restaurant manager   Activity: walks 2930min/day   Diet: fruits/vegetables daily, water, no fish     BP 106/75  Pulse 75  Ht 5' (1.524 m)  Wt 212 lb (96.163 kg)  BMI 41.40 kg/m2  LMP 11/05/2013  Physical Exam:  Chronically ill appearing obese, 43 year old woman, NAD HEENT: Unremarkable Neck:  7 cm JVD, no thyromegally Back:  No CVA tenderness Lungs:  Clear wwith no wheezes, rales, or rhonchi HEART:  Regular rate rhythm, no murmurs, no rubs, no clicks Abd:  soft, positive bowel sounds, no organomegally, no rebound, no guarding Ext:  2 plus pulses, no edema, no cyanosis, no clubbing Skin:  No rashes no nodules Neuro:  CN II through XII intact, motor grossly intact  DEVICE  Normal device function.  See PaceArt for details.   Assess/Plan:

## 2013-12-10 NOTE — Assessment & Plan Note (Signed)
She has had no recurrent ventricular arrhythmias. Will follow. She will continue ranexa and mexitil.

## 2013-12-10 NOTE — Assessment & Plan Note (Signed)
I discussed the cardiac risks associated with gall bladder surgery. My biggest concern is in regard to her inability to take her anti-arrhythmic meds after surgery until her bowel wakes up. Hopefully this will be brief. She could take IV amio if she had recurrent VT perioperatively.

## 2013-12-10 NOTE — Assessment & Plan Note (Signed)
She has had no recurrent atrial arhhythmias. Will follow.

## 2013-12-10 NOTE — Patient Instructions (Signed)
Your physician recommends that you continue on your current medications as directed. Please refer to the Current Medication list given to you today.  Remote monitoring is used to monitor your Pacemaker of ICD from home. This monitoring reduces the number of office visits required to check your device to one time per year. It allows us to keep an eye on the functioning of your device to ensure it is working properly. You are scheduled for a device check from home on 03/17/14. You may send your transmission at any time that day. If you have a wireless device, the transmission will be sent automatically. After your physician reviews your transmission, you will receive a postcard with your next transmission date.  Your physician wants you to follow-up in: 6 months with Dr. Ladona Ridgelaylor.  You will receive a reminder letter in the mail two months in advance. If you don't receive a letter, please call our office to schedule the follow-up appointment.

## 2013-12-13 ENCOUNTER — Telehealth: Payer: Self-pay | Admitting: Cardiovascular Disease

## 2013-12-13 ENCOUNTER — Ambulatory Visit (INDEPENDENT_AMBULATORY_CARE_PROVIDER_SITE_OTHER): Payer: BC Managed Care – PPO | Admitting: Family Medicine

## 2013-12-13 ENCOUNTER — Encounter: Payer: Self-pay | Admitting: Family Medicine

## 2013-12-13 VITALS — BP 108/60 | HR 76 | Temp 98.0°F | Wt 211.8 lb

## 2013-12-13 DIAGNOSIS — F4310 Post-traumatic stress disorder, unspecified: Secondary | ICD-10-CM | POA: Insufficient documentation

## 2013-12-13 MED ORDER — FLUOXETINE HCL 20 MG PO TABS: 20.0000 mg | ORAL_TABLET | Freq: Every day | ORAL | Status: DC

## 2013-12-13 MED ORDER — RANOLAZINE ER 1000 MG PO TB12: 1000.0000 mg | ORAL_TABLET | Freq: Two times a day (BID) | ORAL | Status: DC

## 2013-12-13 MED ORDER — ALPRAZOLAM 1 MG PO TABS: 1.0000 mg | ORAL_TABLET | Freq: Three times a day (TID) | ORAL | Status: DC | PRN

## 2013-12-13 MED ORDER — AMOXICILLIN 500 MG PO CAPS: 2000.0000 mg | ORAL_CAPSULE | Freq: Once | ORAL | Status: DC

## 2013-12-13 NOTE — Progress Notes (Signed)
BP 108/60  Pulse 76  Temp(Src) 98 F (36.7 C) (Oral)  Wt 211 lb 12.8 oz (96.072 kg)  LMP 11/27/2013   CC: discuss anxiety Subjective:    Patient ID: [name], female    DOB: August 14, 1971, 43 y.o.   MRN: [id number]  HPI: [name] is a 43 y.o. female presenting on 12/13/2013 for Anxiety   Recently hospitalized 2/15-19/2015 for ventricular shocks x8 over 4 hours which she was awake for all of them.  She's had shocks in past due to Vfib but she was unconscious for these. She was found to be in atrial flutter and this was ablated in hopes of decreasing.  Since then, has had worsening anxiety - shaking, anxiety and worrying about rpt shock, fear of going out.  Avoiding friend's truck and Statistician where sxs happened.  Afraid to walk [employer] store without buggy.  + flashbacks and nightmares about shocks.  Perseverating on worst case scenario.  Trouble sleeping 2/2 worsening anxiety.  Denies SI/HI. Taking xanax 2 daily which helps.  Improves tremors and anxiety significantly.  Klonopin caused worsened depression.  Ativan didn't help either. Pt hesitant to try cymbalta.  She also has uncle who she states died from complications with zoloft.  H/o gallstones rec against operation unless emergency. 15 lb weight gain noted.  Lab Results  Component Value Date   TSH 0.561 11/14/2013    Wt Readings from Last 3 Encounters:  12/13/13 211 lb 12.8 oz (96.072 kg)  12/10/13 212 lb (96.163 kg)  12/03/13 210 lb (95.255 kg)  Body mass index is 41.36 kg/(m^2).  Relevant past medical, surgical, family and social history reviewed and updated as indicated.  Allergies and medications reviewed and updated. Current Outpatient Prescriptions on File Prior to Visit  Medication Sig  . acetaminophen (TYLENOL) 500 MG tablet Take 500-1,000 mg by mouth 2 (two) times daily as needed (pain).   Marland Kitchen albuterol (PROVENTIL HFA;VENTOLIN HFA) 108 (90 BASE) MCG/ACT inhaler Inhale 1 puff into the lungs 3 (three) times  daily as needed for wheezing or shortness of breath.  Marland Kitchen aspirin EC 81 MG tablet Take 81 mg by mouth daily.  . beclomethasone (QVAR) 80 MCG/ACT inhaler Inhale 1 puff into the lungs 2 (two) times daily.  . cetirizine (ZYRTEC) 10 MG tablet Take 10 mg by mouth daily.   Tery Sanfilippo Calcium (STOOL SOFTENER PO) Take 1 capsule by mouth daily.  . furosemide (LASIX) 40 MG tablet Take 1 tablet (40 mg total) by mouth daily.  Marland Kitchen gabapentin (NEURONTIN) 300 MG capsule Take 1 capsule (300 mg total) by mouth 3 (three) times daily.  Marland Kitchen levothyroxine (SYNTHROID, LEVOTHROID) 50 MCG tablet Take 50 mcg by mouth daily before breakfast.   . magnesium gluconate (MAGONATE) 500 MG tablet Take 250 mg by mouth 2 (two) times daily.  . metoprolol succinate (TOPROL-XL) 25 MG 24 hr tablet Take 1 tablet (25 mg total) by mouth daily.  Marland Kitchen mexiletine (MEXITIL) 150 MG capsule Take 2 capsules (300 mg total) by mouth 3 (three) times daily.  Marland Kitchen oxyCODONE-acetaminophen (PERCOCET/ROXICET) 5-325 MG per tablet Take 1 tablet by mouth 3 (three) times daily as needed for severe pain (pain).  . polyethylene glycol powder (GLYCOLAX/MIRALAX) powder Take 17 g by mouth daily as needed (constipation).   . Potassium Gluconate 595 MG CAPS Take 595 mg by mouth daily.  . Probiotic Product (PROBIOTIC PO) Take by mouth daily.  . ranolazine (RANEXA) 1000 MG SR tablet Take 1,000 mg by mouth 2 (two) times  daily.   . ursodiol (ACTIGALL) 250 MG tablet Take 1 tablet (250 mg total) by mouth 3 (three) times daily.   No current facility-administered medications on file prior to visit.    Review of Systems Per HPI unless specifically indicated above    Objective:    BP 108/60  Pulse 76  Temp(Src) 98 F (36.7 C) (Oral)  Wt 211 lb 12.8 oz (96.072 kg)  LMP 11/27/2013  Physical Exam  Nursing note and vitals reviewed. Constitutional: She appears well-developed and well-nourished. No distress.  HENT:  Mouth/Throat: Oropharynx is clear and moist. No  oropharyngeal exudate.  Cardiovascular: Normal rate, regular rhythm and intact distal pulses.   Murmur (SEM) heard. Pulmonary/Chest: Effort normal and breath sounds normal. No respiratory distress. She has no wheezes. She has no rales.  Coarse breath sounds  Psychiatric: Her mood appears anxious (tearful).       Assessment & Plan:  I will also fill out disability forms.  Problem List Items Addressed This Visit   PTSD (post-traumatic stress disorder) - Primary     Meets criteria for PTSD as has been 4 wks since event. Discussed treatment options including pharmacotherapy and counseling. Will start treatment with prozac 20mg  daily, discussed common side effects of med including increased suicidality. Increase xanax temporarily to tid prn. A total of 25 minutes were spent face-to-face with the patient during this encounter and over half of that time was spent on counseling and coordination of care        Follow up plan: Return in about 6 weeks (around 01/24/2014), or if symptoms worsen or fail to improve.

## 2013-12-13 NOTE — Patient Instructions (Signed)
Let's start prozac 20mg  once daily. May increase xanax to 1mg  up to 3 times a day as needed. Return to see me in 1-2 months for follow up. Good to see you today, call us with questions.  Posttraumatic Stress Disorder Posttraumatic stress disorder (PTSD) is a mental disorder. It occurs after a traumatic event in your life. The traumatic events that cause PTSD are outside the range of normal human experience. Examples of these events include war, automobile accidents, natural disasters, rape, domestic violence, and violent crimes. Most people who experience these types of events are able to heal on their own. Those who do not heal develop PTSD. PTSD can happen to anyone at any age. However, people with a history of childhood abuse are at increased risk for developing PTSD.  SYMPTOMS  The traumatic event that causes PTSD must be a threat to life, cause serious injury, or involve sexual violence. The traumatic event is usually experienced directly by the person who develops PTSD. Sometimes PTSD occurs in people who witness traumas that occur to others or who hear about a trauma that occurs to a close family member or friend. The following behaviors are characteristic of people with PTSD:  People with PTSD re-experience the traumatic event in one or more of the following ways (intrusion symptoms):  Recurrent, unwanted distressing memories while awake.  Recurrent distressing dreams.  Sensations similar to those felt when the event originally occurred (flashbacks).   Intense or prolonged emotional distress, triggered by reminders of the trauma. This may include fear, horror, intense sadness, or anger.  Marked physical reactions, triggered by reminders of the trauma. This may include racing heart, shortness of breath, sweating, and shaking.  People with PTSD avoid thoughts, conversations, people, or activities that remind them of the traumatic event (avoidance symptoms).  People with PTSD have  negative changes in their thinking and mood after the traumatic event. These changes include:  Inability to remember one or more significant aspects of the traumatic event (memory gaps).  Exaggerated negative perceptions about themselves or others, such as believing that they are bad people or that no one can be trusted.  Unrealistic assignment of blame to themselves or others for the traumatic event.  Persistent negative emotional state, such as fear, horror, anger, sadness, guilt, or shame.  Markedly decreased interest or participation in significant activities.  A loss of connection with other people.  Inability to experience positive emotions, such as happiness or love.  People with PTSD are more sensitive to their environment and react more easily than others (hyperarousal overreactivity symptoms). These symptoms include:  Irritability, with angry outbursts toward other people or objects. The outbursts are easily triggered and may be verbal or physical.  Careless or self-destructive behavior. This may include reckless driving or drug use.  A feeling of being on edge, with increased alertness (hypervigilance).  Exaggerated reactions to stimuli, such as being easily startled.   Difficulty concentrating.  Difficulty sleeping. PTSD symptoms may start soon after a frightening event or months or years later. They last at least 1 month or longer and can affect one or more areas of functioning, such as social or occupational functioning.  DIAGNOSIS  PTSD is diagnosed through an assessment by a mental health professional. Bonita QuinYou will be asked questions about the traumatic events in your life. You will also be asked about how these events have changed your thoughts, mood, behavior, and ability to function on a daily basis. You may be asked about your use of alcohol  or drugs, which can make PTSD symptoms worse. TREATMENT  Unlike many mental disorders, which require life-long management,  PTSD is a curable condition. The goal of PTSD treatment is to neutralize the negative effects of the traumatic event on daily functioning, not erase the memory of the event. The following treatments may be prescribed to reach this goal:  Medication Certain medications can reduce some PTSD symptoms. Intrusion symptoms and hyperarousal overactivity symptoms respond best to medications.  Counseling (talk therapy) Talk therapy with a mental health professional who is experienced in treating PTSD can help. Talk therapy can provide education, emotional support, and coping skills. Certain types of talk therapy that specifically target the traumatic events are the most effective treatment for PTSD:  Prolonged exposure therapy, which involves remembering and processing the traumatic event with a therapist in a safe environment until it no longer creates a negative emotional response.  Eye movement desensitization and reprocessing therapy, which involves the use of repetitive physical stimulation of the senses that alternates between the right and left sides of the body. It is believed that this therapy facilitates communication between the two sides of the brain. This communication helps the mind to integrate the fragmented memories of the traumatic event into a whole story that makes sense and no longer creates a negative emotional response. Most people with PTSD benefit from a combination of these treatments.  Document Released: 06/11/2001 Document Revised: 01/11/2013 Document Reviewed: 12/03/2012 [facility] Patient Information 2014 [place], [place].

## 2013-12-13 NOTE — Telephone Encounter (Signed)
Spoke with patient who called for echo and lab results.  Results reviewed with patient who verbalized understanding.  Patient reports that last time Ranexa Rx was renewed that she was only given 500 mg 2 x per day and so she is out of medicine due to having to take 4 pills daily.  I reviewed patient's chart and last refill of this medication was July so I renewed the Rx to patient's pharmacy.  Patient verbalized gratitude and asked me to mail letter to her from Dr. Excell Seltzerooper regarding her disability.

## 2013-12-13 NOTE — Assessment & Plan Note (Signed)
Meets criteria for PTSD as has been 4 wks since event. Discussed treatment options including pharmacotherapy and counseling. Will start treatment with prozac 20mg  daily, discussed common side effects of med including increased suicidality. Increase xanax temporarily to tid prn. A total of 25 minutes were spent face-to-face with the patient during this encounter and over half of that time was spent on counseling and coordination of care

## 2013-12-13 NOTE — Telephone Encounter (Signed)
New message  Patient would like results of Echo. Please call and advise.

## 2013-12-13 NOTE — Progress Notes (Signed)
Pre visit review using our clinic review tool, if applicable. No additional management support is needed unless otherwise documented below in the visit note. 

## 2013-12-28 ENCOUNTER — Encounter: Payer: BC Managed Care – PPO | Admitting: Internal Medicine

## 2013-12-30 ENCOUNTER — Telehealth: Payer: Self-pay | Admitting: Internal Medicine

## 2013-12-30 ENCOUNTER — Telehealth (INDEPENDENT_AMBULATORY_CARE_PROVIDER_SITE_OTHER): Payer: Self-pay | Admitting: General Surgery

## 2013-12-30 NOTE — Telephone Encounter (Signed)
New message    Patient at the dentist off now . Need to get a tooth pull . Wants to know about pre-med.

## 2013-12-30 NOTE — Telephone Encounter (Signed)
Spoke with patient  Per Dr Ladona Ridgelaylor ok to get Novocain  Patient aware and will call with any questions

## 2013-12-30 NOTE — Telephone Encounter (Signed)
LMOM for patient to call [facility] and [facility]. I need to get her an apt to come back in to see Dr Corliss Skainssuei

## 2014-01-06 ENCOUNTER — Emergency Department (HOSPITAL_COMMUNITY)
Admission: EM | Admit: 2014-01-06 | Discharge: 2014-01-07 | Disposition: A | Discharge status: Home / Self Care | Payer: BC Managed Care – PPO | Attending: Emergency Medicine | Admitting: Emergency Medicine

## 2014-01-06 ENCOUNTER — Encounter (HOSPITAL_COMMUNITY): Payer: Self-pay | Admitting: Emergency Medicine

## 2014-01-06 ENCOUNTER — Emergency Department (HOSPITAL_COMMUNITY): Payer: BC Managed Care – PPO

## 2014-01-06 DIAGNOSIS — Z8719 Personal history of other diseases of the digestive system: Secondary | ICD-10-CM | POA: Insufficient documentation

## 2014-01-06 DIAGNOSIS — R011 Cardiac murmur, unspecified: Secondary | ICD-10-CM | POA: Insufficient documentation

## 2014-01-06 DIAGNOSIS — Z95810 Presence of automatic (implantable) cardiac defibrillator: Secondary | ICD-10-CM | POA: Insufficient documentation

## 2014-01-06 DIAGNOSIS — Z9889 Other specified postprocedural states: Secondary | ICD-10-CM | POA: Insufficient documentation

## 2014-01-06 DIAGNOSIS — G43809 Other migraine, not intractable, without status migrainosus: Secondary | ICD-10-CM | POA: Insufficient documentation

## 2014-01-06 DIAGNOSIS — Z87891 Personal history of nicotine dependence: Secondary | ICD-10-CM | POA: Insufficient documentation

## 2014-01-06 DIAGNOSIS — I472 Ventricular tachycardia, unspecified: Secondary | ICD-10-CM | POA: Insufficient documentation

## 2014-01-06 DIAGNOSIS — E039 Hypothyroidism, unspecified: Secondary | ICD-10-CM | POA: Insufficient documentation

## 2014-01-06 DIAGNOSIS — J45909 Unspecified asthma, uncomplicated: Secondary | ICD-10-CM | POA: Insufficient documentation

## 2014-01-06 DIAGNOSIS — G8929 Other chronic pain: Secondary | ICD-10-CM | POA: Insufficient documentation

## 2014-01-06 DIAGNOSIS — IMO0002 Reserved for concepts with insufficient information to code with codable children: Secondary | ICD-10-CM | POA: Insufficient documentation

## 2014-01-06 DIAGNOSIS — F411 Generalized anxiety disorder: Secondary | ICD-10-CM | POA: Insufficient documentation

## 2014-01-06 DIAGNOSIS — Z872 Personal history of diseases of the skin and subcutaneous tissue: Secondary | ICD-10-CM | POA: Insufficient documentation

## 2014-01-06 DIAGNOSIS — Z7982 Long term (current) use of aspirin: Secondary | ICD-10-CM | POA: Insufficient documentation

## 2014-01-06 DIAGNOSIS — Z79899 Other long term (current) drug therapy: Secondary | ICD-10-CM | POA: Insufficient documentation

## 2014-01-06 DIAGNOSIS — I5042 Chronic combined systolic (congestive) and diastolic (congestive) heart failure: Secondary | ICD-10-CM | POA: Insufficient documentation

## 2014-01-06 DIAGNOSIS — Z86718 Personal history of other venous thrombosis and embolism: Secondary | ICD-10-CM | POA: Insufficient documentation

## 2014-01-06 DIAGNOSIS — I4729 Other ventricular tachycardia: Secondary | ICD-10-CM | POA: Insufficient documentation

## 2014-01-06 DIAGNOSIS — Z862 Personal history of diseases of the blood and blood-forming organs and certain disorders involving the immune mechanism: Secondary | ICD-10-CM | POA: Insufficient documentation

## 2014-01-06 DIAGNOSIS — R609 Edema, unspecified: Secondary | ICD-10-CM | POA: Insufficient documentation

## 2014-01-06 DIAGNOSIS — I4891 Unspecified atrial fibrillation: Secondary | ICD-10-CM | POA: Insufficient documentation

## 2014-01-06 LAB — COMPREHENSIVE METABOLIC PANEL
ALBUMIN: 4 g/dL (ref 3.5–5.2)
ALT: 24 U/L (ref 0–35)
AST: 22 U/L (ref 0–37)
Alkaline Phosphatase: 62 U/L (ref 39–117)
BILIRUBIN TOTAL: 0.4 mg/dL (ref 0.3–1.2)
BUN: 15 mg/dL (ref 6–23)
CO2: 27 mEq/L (ref 19–32)
CREATININE: 0.79 mg/dL (ref 0.50–1.10)
Calcium: 9.5 mg/dL (ref 8.4–10.5)
Chloride: 96 mEq/L (ref 96–112)
GFR calc Af Amer: >90 mL/min (ref >90)
GFR calc non Af Amer: >90 mL/min (ref >90)
Glucose, Bld: 103 mg/dL — ABNORMAL HIGH (ref 70–99)
Potassium: 4.1 mEq/L (ref 3.7–5.3)
Sodium: 138 mEq/L (ref 137–147)
Total Protein: 7.9 g/dL (ref 6.0–8.3)

## 2014-01-06 LAB — CBC WITH DIFFERENTIAL/PLATELET
BASOS ABS: 0 10*3/uL (ref 0.0–0.1)
BASOS PCT: 0 % (ref 0–1)
EOS ABS: 0.2 10*3/uL (ref 0.0–0.7)
Eosinophils Relative: 3 % (ref 0–5)
HCT: 42.3 % (ref 36.0–46.0)
HEMOGLOBIN: 14.1 g/dL (ref 12.0–15.0)
Lymphocytes Relative: 16 % (ref 12–46)
Lymphs Abs: 1.1 10*3/uL (ref 0.7–4.0)
MCH: 29 pg (ref 26.0–34.0)
MCHC: 33.3 g/dL (ref 30.0–36.0)
MCV: 86.9 fL (ref 78.0–100.0)
MONO ABS: 0.9 10*3/uL (ref 0.1–1.0)
MONOS PCT: 12 % (ref 3–12)
Neutro Abs: 4.8 10*3/uL (ref 1.7–7.7)
Neutrophils Relative %: 69 % (ref 43–77)
Platelets: 277 10*3/uL (ref 150–400)
RBC: 4.87 MIL/uL (ref 3.87–5.11)
RDW: 14.1 % (ref 11.5–15.5)
WBC: 7 10*3/uL (ref 4.0–10.5)

## 2014-01-06 LAB — TROPONIN I

## 2014-01-06 LAB — PROTIME-INR
INR: 1.01 (ref 0.00–1.49)
PROTHROMBIN TIME: 13.1 s (ref 11.6–15.2)

## 2014-01-06 LAB — I-STAT TROPONIN, ED: Troponin i, poc: 0.04 ng/mL (ref 0.00–0.08)

## 2014-01-06 MED ORDER — SODIUM CHLORIDE 0.9 % IV BOLUS (SEPSIS)
500.0000 mL | Freq: Once | INTRAVENOUS | Status: AC
  Administered 2014-01-06: 500 mL via INTRAVENOUS

## 2014-01-06 MED ORDER — MEXILETINE HCL 150 MG PO CAPS
300.0000 mg | ORAL_CAPSULE | Freq: Three times a day (TID) | ORAL | Status: DC
  Administered 2014-01-06: 300 mg via ORAL
  Filled 2014-01-06: qty 2

## 2014-01-06 MED ORDER — RANOLAZINE ER 500 MG PO TB12
1000.0000 mg | ORAL_TABLET | ORAL | Status: AC
  Administered 2014-01-06: 1000 mg via ORAL
  Filled 2014-01-06: qty 2

## 2014-01-06 MED ORDER — GABAPENTIN 300 MG PO CAPS
300.0000 mg | ORAL_CAPSULE | Freq: Three times a day (TID) | ORAL | Status: DC
  Administered 2014-01-06: 300 mg via ORAL
  Filled 2014-01-06: qty 1

## 2014-01-06 NOTE — ED Notes (Signed)
Per EMS pt was at home cooking dinner when she started to feel her heart race, pt reports she has had this happen before. Pt reports she became very lightheaded as her heart was racing. 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 Pt reports she had an ablation on Nov 16, 2013. Upon EMS arrival, pt's HR was 75 and pt was in NSR. Pt is very anxious upon arrival to department. Pt denies chest pain, SOB, N/V. Pt is A&O X4.

## 2014-01-06 NOTE — ED Provider Notes (Signed)
CSN: [id number]     Arrival date & time 01/06/14  2151 History   First MD Initiated Contact with Patient 01/06/14 2207     Chief Complaint  Patient presents with  . Tachycardia  . Dizziness     (Consider location/radiation/quality/duration/timing/severity/associated sxs/prior Treatment) HPI 43 year old female presents to the emergency department from home via EMS with complaint of heart racing and dizziness.  Patient has complicated history of hypertrophic cardiomyopathy, status post septal myomectomy, AICD, ablation for V. fib and recent ablation for a flutter.  Patient reports she was making dinner when she felt her heart race.  She sat down and she had 2 more episodes.  With the final episode.  She reports that she felt hot and dizzy and lightheaded.  She did not have syncope.  Her AICD was interrogated by [employer].  They report 3 episodes of V. tach.  The first 15 seconds.  The second 20 seconds.  Both resolved spontaneously.  The third episode, lasting 31 seconds, and she received therapy.  Patient denies missing any medications, no chest pain or shortness of breath.  She reports that her blood pressure is slightly low here in the ER today, reports she normally runs mid 90s systolic.  She is eating and drinking well, has no other complaints. Past Medical History  Diagnosis Date  . Hypertrophic cardiomyopathy     s/p septal myomectomy with implantable defibrillator 01/27/2012   . Anxiety state, unspecified   . Allergic rhinitis     to dogs  . Asthma   . Anemia     during pregnancy  . History of chicken pox   . Generalized headaches   . Ocular migraine     no HA  . Back pain     told cracked bone, vicodin didn't help, improved with percocets/muscle relaxants, no eval by prior PCP  . 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 DVT (deep venous thrombosis) 01/2012    was placed on Warfarin after cardiac surgery but had skin  reaction to warfarin so was changed to Pradaxa so DVT occurred while on Pradaxa although unclear if it occurred during transition  . Hypotension   . Atrial fibrillation     a. On pradaxa.  coumadin necrosis with warfarin;  b. Amio d/c'd 02/2012  . Pericardial effusion   . Chronic combined systolic and diastolic CHF (congestive heart failure)     a. echo 03/19/12: severe asymmetric septal hypertrophy, evidence of upper septal myomectomy, peak LV outflow tract gradient 35, EF 35%,;  b.  Echo 9/13: Severe LVH, EF 30-35%, anteroseptal and inferior HK, LVOT narrow, mild AI, mild to moderate mitral stenosis, severe LAE  . Pericardial tamponade 03/14/2012  . 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 01/2012 s/p BSX ICD;  b. 06/2012 Multaq initiated due to recurrent syncope, VT/VF - had severe n/v with IV amio.  . Skin necrosis --COUMADIN   . Chronic chest wall pain     eval by CVTS, stable, rec [facility] if continued pain  . Syncope     a. in setting of VT/VF.  Marland Kitchen. Hypothyroid   . Blood transfusion without reported diagnosis   . Neuromuscular disorder   . Gall stones   . Atrial flutter 10/2013    with inappropriate ICD therapy; s/p CTI ablation by Dr Ladona Ridgelaylor 11/16/2013   Past Surgical History  Procedure Laterality Date  . Cesarean section  1991  .  Induced abortion  1990 and 1993  . Cardiac surgery  01/27/2012    median sternotomy, septal myectomy, MVR - [facility] (Dr. Silvestre Mesi)  . Cardiac defibrillator placement  01/2012    BSX dual chamber ICD  . Myomectomy    . Pericardial window  03/14/2012    Procedure: PERICARDIAL WINDOW;  Surgeon: Purcell Nails, MD;  Location: [facility] OR;  Service: Open Heart Surgery;  Laterality: N/A;  Subxyphoid Pericardial  Window   . Ablation  11-16-13    RFCA of atrial flutter by Dr Ladona Ridgel    Family History  Problem Relation Age of Onset  . Diabetes Father   . Heart disease Father     unspecified heart problem  . Thyroid disease Mother   . Anemia Mother      mediterranean anemia, ITP  . Heart disease Maternal Grandfather     CHF  . Diabetes Paternal Grandfather   . Cancer Paternal Grandfather   . Coronary artery disease Neg Hx   . Stroke Neg Hx   . Sudden death Neg Hx    History  Substance Use Topics  . Smoking status: Former Smoker -- 1.00 packs/day for 3 years    Types: Cigarettes    Quit date: 09/30/1988  . Smokeless tobacco: Never Used     Comment: quitin 1990  . Alcohol Use: No     Comment: none since "months" as of 02/28/2013   OB History   Grav Para Term Preterm Abortions TAB SAB Ect Mult Living                 Review of Systems   See History of Present Illness; otherwise all other systems are reviewed and negative  Allergies  Phenergan; Amiodarone; Nitroglycerin; and Warfarin and related  Home Medications   Current Outpatient Rx  Name  Route  Sig  Dispense  Refill  . acetaminophen (TYLENOL) 500 MG tablet   Oral   Take 500-1,000 mg by mouth 2 (two) times daily as needed (pain).          Marland Kitchen albuterol (PROVENTIL HFA;VENTOLIN HFA) 108 (90 BASE) MCG/ACT inhaler   Inhalation   Inhale 1 puff into the lungs 3 (three) times daily as needed for wheezing or shortness of breath.         . ALPRAZolam (XANAX) 1 MG tablet   Oral   Take 1 tablet (1 mg total) by mouth 3 (three) times daily as needed for anxiety.   90 tablet   0   . amoxicillin (AMOXIL) 500 MG capsule   Oral   Take 4 capsules (2,000 mg total) by mouth once. 1 hour prior to dental procedure   4 capsule   0   . aspirin EC 81 MG tablet   Oral   Take 81 mg by mouth daily.         . beclomethasone (QVAR) 80 MCG/ACT inhaler   Inhalation   Inhale 1 puff into the lungs 2 (two) times daily.         . cetirizine (ZYRTEC) 10 MG tablet   Oral   Take 10 mg by mouth daily.          Tery Sanfilippo Calcium (STOOL SOFTENER PO)   Oral   Take 1 capsule by mouth daily.         . furosemide (LASIX) 40 MG tablet   Oral   Take 1 tablet (40 mg total) by mouth  daily.   30 tablet   5   .  gabapentin (NEURONTIN) 300 MG capsule   Oral   Take 1 capsule (300 mg total) by mouth 3 (three) times daily.   90 capsule   6   . levothyroxine (SYNTHROID, LEVOTHROID) 50 MCG tablet   Oral   Take 50 mcg by mouth daily before breakfast.          . magnesium gluconate (MAGONATE) 500 MG tablet   Oral   Take 250 mg by mouth 2 (two) times daily.         . metoprolol succinate (TOPROL-XL) 25 MG 24 hr tablet   Oral   Take 1 tablet (25 mg total) by mouth daily.   30 tablet   6   . mexiletine (MEXITIL) 150 MG capsule   Oral   Take 2 capsules (300 mg total) by mouth 3 (three) times daily.   180 capsule   6   . oxyCODONE-acetaminophen (PERCOCET/ROXICET) 5-325 MG per tablet   Oral   Take 1 tablet by mouth 3 (three) times daily as needed for severe pain (pain).         . polyethylene glycol powder (GLYCOLAX/MIRALAX) powder   Oral   Take 17 g by mouth daily as needed (constipation).          . Potassium Gluconate 595 MG CAPS   Oral   Take 595 mg by mouth daily.         . Probiotic Product (PROBIOTIC PO)   Oral   Take 1 tablet by mouth daily.          . ranolazine (RANEXA) 1000 MG SR tablet   Oral   Take 1 tablet (1,000 mg total) by mouth 2 (two) times daily.   180 tablet   3    BP 86/52  Pulse 75  Resp 21  SpO2 98%  LMP 11/27/2013 Physical Exam  Nursing note and vitals reviewed. Constitutional: She is oriented to person, place, and time. She appears well-developed and well-nourished.  HENT:  Head: Normocephalic and atraumatic.  Nose: Nose normal.  Mouth/Throat: Oropharynx is clear and moist.  Eyes: Conjunctivae and EOM are normal. Pupils are equal, round, and reactive to light.  Neck: Normal range of motion. Neck supple. No JVD present. No tracheal deviation present. No thyromegaly present.  Cardiovascular: Normal rate, regular rhythm and intact distal pulses.  Exam reveals no gallop and no friction rub.   Murmur (4/6)  heard. Pulmonary/Chest: Effort normal and breath sounds normal. No stridor. No respiratory distress. She has no wheezes. She has no rales. She exhibits no tenderness.  Abdominal: Soft. Bowel sounds are normal. She exhibits no distension and no mass. There is no tenderness. There is no rebound and no guarding.  Musculoskeletal: Normal range of motion. She exhibits edema ( trace). She exhibits no tenderness.  Lymphadenopathy:    She has no cervical adenopathy.  Neurological: She is oriented to person, place, and time. She has normal reflexes. No cranial nerve deficit. She exhibits normal muscle tone. Coordination normal.  Skin: Skin is dry. No rash noted. No erythema. No pallor.  Psychiatric: She has a normal mood and affect. Her behavior is normal. Judgment and thought content normal.    ED Course  Procedures (including critical care time) Labs Review Labs Reviewed  COMPREHENSIVE METABOLIC PANEL - Abnormal; Notable for the following:    Glucose, Bld 103 (*)    All other components within normal limits  CBC WITH DIFFERENTIAL  TROPONIN I  PROTIME-INR  MAGNESIUM  I-STAT TROPOININ, ED  Imaging Review No results found.   EKG Interpretation   Date/Time:  Thursday January 06 2014 21:58:10 EDT Ventricular Rate:  76 PR Interval:  229 QRS Duration: 178 QT Interval:  488 QTC Calculation: 549 R Axis:   100 Text Interpretation:  Sinus rhythm Ventricular premature complex Prolonged  PR interval Probable left atrial enlargement Nonspecific intraventricular  conduction delay Repol abnrm suggests ischemia, lateral leads No  significant change was found Confirmed by Manus Gunning  MD, STEPHEN [phone]) on  01/06/2014 10:15:45 PM     EKG with paced rhythm, unchanged from prior MDM   Final diagnoses:  Ventricular tachyarrhythmia    43 year old female with V. tach tonight, appropriately managed by her AICD.  No signs of fluid overload, ischemia , hypokalemia, or other electrolyte derangements.   We'll touch base with cardiology on call.  Cards, Dr Adolm Joseph, has seen the patient, and feels she is stable for d/c to f/u with cardiology in clinic.    Olivia Mackie, MD 01/07/14 [phone]

## 2014-01-06 NOTE — ED Notes (Signed)
Jason from [employer] at bedside . 

## 2014-01-06 NOTE — ED Notes (Signed)
[employer] paged to Estate agentinterrogate pacemaker/defibrillator.

## 2014-01-06 NOTE — ED Notes (Signed)
Phlebotomy at bedside.

## 2014-01-07 ENCOUNTER — Telehealth: Payer: Self-pay | Admitting: Internal Medicine

## 2014-01-07 DIAGNOSIS — I421 Obstructive hypertrophic cardiomyopathy: Secondary | ICD-10-CM

## 2014-01-07 DIAGNOSIS — I4729 Other ventricular tachycardia: Secondary | ICD-10-CM

## 2014-01-07 DIAGNOSIS — I472 Ventricular tachycardia: Secondary | ICD-10-CM

## 2014-01-07 LAB — MAGNESIUM: MAGNESIUM: 1.8 mg/dL (ref 1.5–2.5)

## 2014-01-07 MED ORDER — ONDANSETRON HCL 4 MG/2ML IJ SOLN: 4.0000 mg | Freq: Once | INTRAMUSCULAR | Status: DC

## 2014-01-07 NOTE — ED Notes (Signed)
Pt reports Karen SeniorStephen will be driving her home. Pt A&Ox4, ambulatory at discharge, verbalizing no complaints at this time.

## 2014-01-07 NOTE — Consult Note (Signed)
CARDIOLOGY CONSULT NOTE  Patient ID: [name], MRN: [id number], DOB/AGE: Feb 27, 1971 43 y.o. Admit date: 01/06/2014 Date of Consult: 01/07/2014  Primary Physician: Eustaquio Boyden, MD Primary Cardiologist: Lewayne Bunting  Chief Complaint: palpitations Reason for Consultation: therapy from device  HPI: 43 y.o. female w/ PMHx significant for HOCM s/p myomecty, VT s/p ICD s/p ablation, afib/flutter s/p ablation who presented to [facility] on 01/07/2014 with complaints of palpitations.  Recent history reviewed. Admitted in February for multiple ICD shocks, found to be due to aflutter and underwent ablation. Prior to that in June 2014, underwent VT ablation which reported was unsuccessful. Started on mexilitine and ranexa at that time.   Currently feels back to baseline except for some mild nausea which she relates to her gallbladder issues which she is concerned may be delayed due to this.  Earlier this evening, she was finishing up cleaning up around the house and had several epsiodes of palpitations and then one long sustained one which caused her to feel flushed and have chest pain. Sat down and the symptoms resolved.  Pt's device was interrogated and demonstrated 2 episodes of NSVT and 1 episode of VT at treated with ATP pacing.  EKG with sinus, LBBB.  Labs wnl.  Has not missed any medications.   Past Medical History  Diagnosis Date  . Hypertrophic cardiomyopathy     s/p septal myomectomy with implantable defibrillator 01/27/2012   . Anxiety state, unspecified   . Allergic rhinitis     to dogs  . Asthma   . Anemia     during pregnancy  . History of chicken pox   . Generalized headaches   . Ocular migraine     no HA  . Back pain     told cracked bone, vicodin didn't help, improved with percocets/muscle relaxants, no eval by prior PCP  . 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 DVT (deep venous thrombosis) 01/2012     was placed on Warfarin after cardiac surgery but had skin reaction to warfarin so was changed to Pradaxa so DVT occurred while on Pradaxa although unclear if it occurred during transition  . Hypotension   . Atrial fibrillation     a. On pradaxa.  coumadin necrosis with warfarin;  b. Amio d/c'd 02/2012  . Pericardial effusion   . Chronic combined systolic and diastolic CHF (congestive heart failure)     a. echo 03/19/12: severe asymmetric septal hypertrophy, evidence of upper septal myomectomy, peak LV outflow tract gradient 35, EF 35%,;  b.  Echo 9/13: Severe LVH, EF 30-35%, anteroseptal and inferior HK, LVOT narrow, mild AI, mild to moderate mitral stenosis, severe LAE  . Pericardial tamponade 03/14/2012  . 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 01/2012 s/p BSX ICD;  b. 06/2012 Multaq initiated due to recurrent syncope, VT/VF - had severe n/v with IV amio.  . Skin necrosis --COUMADIN   . Chronic chest wall pain     eval by CVTS, stable, rec return to [facility] if continued pain  . Syncope     a. in setting of VT/VF.  Marland Kitchen Hypothyroid   . Blood transfusion without reported diagnosis   . Neuromuscular disorder   . Gall stones   . Atrial flutter 10/2013    with inappropriate ICD therapy; s/p CTI ablation by Dr Ladona Ridgel 11/16/2013      Surgical History:  Past Surgical History  Procedure Laterality Date  .  Cesarean section  1991  . Induced abortion  1990 and 1993  . Cardiac surgery  01/27/2012    median sternotomy, septal myectomy, MVR - [facility] (Dr. Silvestre Mesi)  . Cardiac defibrillator placement  01/2012    BSX dual chamber ICD  . Myomectomy    . Pericardial window  03/14/2012    Procedure: PERICARDIAL WINDOW;  Surgeon: Purcell Nails, MD;  Location: [facility] OR;  Service: Open Heart Surgery;  Laterality: N/A;  Subxyphoid Pericardial  Window   . Ablation  11-16-13    RFCA of atrial flutter by Dr Ladona Ridgel      Home Meds: Prior to Admission medications   Medication Sig Start Date End Date Taking?  Authorizing Provider  acetaminophen (TYLENOL) 500 MG tablet Take 500-1,000 mg by mouth 2 (two) times daily as needed (pain).    Yes Historical Provider, MD  albuterol (PROVENTIL HFA;VENTOLIN HFA) 108 (90 BASE) MCG/ACT inhaler Inhale 1 puff into the lungs 3 (three) times daily as needed for wheezing or shortness of breath.   Yes Historical Provider, MD  ALPRAZolam Prudy Feeler) 1 MG tablet Take 1 tablet (1 mg total) by mouth 3 (three) times daily as needed for anxiety. 12/13/13  Yes Eustaquio Boyden, MD  amoxicillin (AMOXIL) 500 MG capsule Take 4 capsules (2,000 mg total) by mouth once. 1 hour prior to dental procedure 12/13/13  Yes Eustaquio Boyden, MD  aspirin EC 81 MG tablet Take 81 mg by mouth daily.   Yes Historical Provider, MD  beclomethasone (QVAR) 80 MCG/ACT inhaler Inhale 1 puff into the lungs 2 (two) times daily.   Yes Historical Provider, MD  cetirizine (ZYRTEC) 10 MG tablet Take 10 mg by mouth daily.    Yes Historical Provider, MD  Docusate Calcium (STOOL SOFTENER PO) Take 1 capsule by mouth daily.   Yes Historical Provider, MD  furosemide (LASIX) 40 MG tablet Take 1 tablet (40 mg total) by mouth daily. 12/10/13  Yes Tonny Bollman, MD  gabapentin (NEURONTIN) 300 MG capsule Take 1 capsule (300 mg total) by mouth 3 (three) times daily. 08/03/13  Yes Adam Gus Rankin, DO  levothyroxine (SYNTHROID, LEVOTHROID) 50 MCG tablet Take 50 mcg by mouth daily before breakfast.  04/09/13 04/09/14 Yes Historical Provider, MD  magnesium gluconate (MAGONATE) 500 MG tablet Take 250 mg by mouth 2 (two) times daily.   Yes Historical Provider, MD  metoprolol succinate (TOPROL-XL) 25 MG 24 hr tablet Take 1 tablet (25 mg total) by mouth daily. 11/22/13 11/22/14 Yes Tonny Bollman, MD  mexiletine (MEXITIL) 150 MG capsule Take 2 capsules (300 mg total) by mouth 3 (three) times daily. 10/12/13 10/12/14 Yes Marinus Maw, MD  oxyCODONE-acetaminophen (PERCOCET/ROXICET) 5-325 MG per tablet Take 1 tablet by mouth 3 (three) times  daily as needed for severe pain (pain).   Yes Historical Provider, MD  polyethylene glycol powder (GLYCOLAX/MIRALAX) powder Take 17 g by mouth daily as needed (constipation).  10/01/13  Yes Historical Provider, MD  Potassium Gluconate 595 MG CAPS Take 595 mg by mouth daily.   Yes Historical Provider, MD  Probiotic Product (PROBIOTIC PO) Take 1 tablet by mouth daily.    Yes Historical Provider, MD  ranolazine (RANEXA) 1000 MG SR tablet Take 1 tablet (1,000 mg total) by mouth 2 (two) times daily. 12/13/13 12/13/14 Yes Tonny Bollman, MD    Inpatient Medications:  . gabapentin  300 mg Oral TID  . mexiletine  300 mg Oral TID  . ondansetron  4 mg Intravenous Once      Allergies:  Allergies  Allergen Reactions  . Phenergan [Promethazine Hcl] Itching  . Amiodarone Nausea And Vomiting    N/V on IV amio 06/2012  . Nitroglycerin     Hypotension  . Warfarin And Related Other (See Comments)    Scars on skin    History   Social History  . Marital Status: Single    Spouse Name: N/A    Number of Children: 1  . Years of Education: N/A   Occupational History  . Naval architect    Social History Main Topics  . Smoking status: Former Smoker -- 1.00 packs/day for 3 years    Types: Cigarettes    Quit date: 09/30/1988  . Smokeless tobacco: Never Used     Comment: quitin 1990  . Alcohol Use: No     Comment: none since "months" as of 02/28/2013  . Drug Use: No     Comment: quiti n 1989  . Sexual Activity: Yes    Birth Control/ Protection: Pill   Other Topics Concern  . Not on file   Social History Narrative   Caffeine: 1 cup coffee/day   Divorced, 1 child.  lives with boyfriend, dogs   Occupation: Full time Tax adviser   Activity: walks 66min/day   Diet: fruits/vegetables daily, water, no fish     Family History  Problem Relation Age of Onset  . Diabetes Father   . Heart disease Father     unspecified heart problem  . Thyroid disease Mother   . Anemia Mother      mediterranean anemia, ITP  . Heart disease Maternal Grandfather     CHF  . Diabetes Paternal Grandfather   . Cancer Paternal Grandfather   . Coronary artery disease Neg Hx   . Stroke Neg Hx   . Sudden death Neg Hx      Review of Systems General: negative for chills, fever, night sweats or weight changes.  Cardiovascular: see HPI Dermatological: negative for rash Respiratory: negative for cough or wheezing Urologic: negative for hematuria Abdominal: negative for nausea, vomiting, diarrhea, bright red blood per rectum, melena, or hematemesis Neurologic: negative for visual changes, syncope, or dizziness All other systems reviewed and are otherwise negative except as noted above.  Labs:  Recent Labs  01/06/14 2245  TROPONINI <0.30   Lab Results  Component Value Date   WBC 7.0 01/06/2014   HGB 14.1 01/06/2014   HCT 42.3 01/06/2014   MCV 86.9 01/06/2014   PLT 277 01/06/2014    Recent Labs Lab 01/06/14 2244  NA 138  K 4.1  CL 96  CO2 27  BUN 15  CREATININE 0.79  CALCIUM 9.5  PROT 7.9  BILITOT 0.4  ALKPHOS 62  ALT 24  AST 22  GLUCOSE 103*   Lab Results  Component Value Date   CHOL 192 08/16/2013   HDL 49.10 08/16/2013   LDLCALC 113* 08/16/2013   TRIG 152.0* 08/16/2013    Radiology/Studies:  Dg Chest 2 View  01/07/2014   CLINICAL DATA:  Chest pain  EXAM: CHEST  2 VIEW  COMPARISON:  11/27/2013  FINDINGS: Dual-chamber right approach ICD/ pacer. Unchanged lead orientation, including lead entering the azygos system. Unchanged cardiomegaly. Remote median sternotomy. No edema or consolidation. No effusion or pneumothorax.  IMPRESSION: Stable exam.  No active cardiopulmonary disease.   Electronically Signed   By: Tiburcio Pea M.D.   On: 01/07/2014 00:06     Physical Exam: Blood pressure 98/51, pulse 75, resp. rate 18, last menstrual  period 11/27/2013, SpO2 98.00%. General: Well developed, well nourished, in no acute distress. Head: Normocephalic, atraumatic, sclera  non-icteric, no xanthomas, nares are without discharge.  Neck: Supple. Negative for carotid bruits. JVD not elevated. Lungs: Clear bilaterally to auscultation without wheezes, rales, or rhonchi. Breathing is unlabored. Heart: RRR with S1 S2. No murmurs, rubs, or gallops appreciated. Abdomen: Soft, non-tender, non-distended with normoactive bowel sounds. No hepatomegaly. No rebound/guarding. No obvious abdominal masses. Msk:  Strength and tone appear normal for age. Extremities: No clubbing or cyanosis. No edema.  Distal pedal pulses are 2+ and equal bilaterally. Neuro: Alert and oriented X 3. Moves all extremities spontaneously. Psych:  Responds to questions appropriately with a normal affect.   Assessment and Plan:  Problem List 1. VT, successfully treated with ATP 2. HOCM 3. VT s/p ablation, now on antiarrhythmics 4. Aflutter s/p ablation  43 y.o. female w/ PMHx significant for HOCM s/p myomecty, VT s/p ICD s/p ablation, afib/flutter s/p ablation who [facility] 01[facility]07[facility]2014 with complaints of palpitations --> found to have one episode of VT s/p ATP therapy.  Appropriate therapy. Single episode, symptomatic but now feels improved. No precipitating causes identified (missing meds, infection, ischemia, electrolyte abnormalities, device malfunction etc). Feels back to baseline. Okay to discharge from ER.  Will have Dr. Lubertha Basqueaylor's office follow up tomorrow to check in.   Signed, Ardis RowanMatthew Sahian Kerney MD 01/07/2014, 1:46 AM

## 2014-01-07 NOTE — Telephone Encounter (Signed)
New message    Seen in [place] last night.  Pt went into V-tach.  She wanted Dr Ladona Ridgelaylor to know.

## 2014-01-07 NOTE — Discharge Instructions (Signed)
Follow up with your cardiologist as instructed. Return to the ER for worsening condition or new concerning symptoms.   Cardiac Arrhythmia Your heart is a muscle that works to pump blood through your body by regular contractions. The beating of your heart is controlled by a system of special pacemaker cells. These cells control the electrical activity of the heart. When the system controlling this regular beating is disturbed, a heart rhythm abnormality (arrhythmia) results. WHEN YOUR HEART SKIPS A BEAT One of the most common and least serious heart arrhythmias is called an ectopic or premature atrial heartbeat (PAC). This may be noticed as a small change in your regular pulse. A PAC originates from the top part (atrium) of the heart. Within the right atrium, the SA node is the area that normally controls the regularity of the heart. PACs occur in heart tissue outside of the SA node region. You may feel this as a skipped beat or heart flutter, especially if several occur in succession or occur frequently.  Another arrhythmia is ventricular premature complex (VCP or PVC). These extra beats start out in the bottom, more muscular chambers of the heart. In most cases a PVC is harmless. If there are underlying causes that are making the heart irritable such as an overactive thyroid or a prior heart attack PVCs may be of more concern. In a few cases, medications to control the heart rhythm may be prescribed. Things to try at home:  Cut down or avoid alcohol, tobacco and caffeine.  Get enough sleep.  Reduce stress.  Exercise more. WHEN THE HEART BEATS TOO FAST Atrial tachycardia is a fast heart rate, which starts out in the atrium. It may last from minutes to much longer. Your heart may beat 140 to 240 times per minute instead of the normal 60 to 100.  Symptoms include a worried feeling (anxiety) and a sense that your heart is beating fast and hard.  You may be able to stop the fast rate by holding  your breath or bearing down as if you were going to have a bowel movement.  This type of fast rate is usually not dangerous. Atrial fibrillation and atrial flutter are other fast rhythms that start in the atria. Both conditions keep the atria from filling with enough blood so the heart does not work well.  Symptoms include feeling light-headed or faint.  These fast rates may be the result of heart damage or disease. Too much thyroid hormone may play a role.  There may be no clear cause or it may be from heart disease or damage.  Medication or a special electrical treatment (cardioversion) may be needed to get the heart beating normally. Ventricular tachycardia is a fast heart rate that starts in the lower muscular chambers (ventricles) This is a serious disorder that requires treatment as soon as possible. You need someone else to get and use a small defibrillator.  Symptoms include collapse, chest pain, or being short of breath.  Treatment may include medication, procedures to improve blood flow to the heart, or an implantable cardiac defibrillator (ICD). DIAGNOSIS   A cardiogram (EKG or ECG) will be done to see the arrhythmia, as well as lab tests to check the underlying cause.  If the extra beats or fast rate come and go, you may wear a Holter monitor that records your heart rate for a longer period of time. SEEK MEDICAL CARE IF:  You have irregular or fast heartbeats (palpitations).  You experience skipped beats.  You  develop lightheadedness.  You have chest discomfort.  You have shortness of breath.  You have more frequent episodes, if you are already being treated. SEEK IMMEDIATE MEDICAL CARE IF:   You have severe chest pain, especially if the pain is crushing or pressure-like and spreads to the arms, back, neck, or jaw, or if you have sweating, feeling sick to your stomach (nausea), or shortness of breath. THIS IS AN EMERGENCY. Do not wait to see if the pain will go away.  Get medical help at once. Call 911 or 0 (operator). DO NOT drive yourself to the hospital.  You feel dizzy or faint.  You have episodes of previously documented atrial tachycardia that do not resolve with the techniques your caregiver has taught you.  Irregular or rapid heartbeats begin to occur more often than in the past, especially if they are associated with more pronounced symptoms or of longer duration. Document Released: 09/16/2005 Document Revised: 12/09/2011 Document Reviewed: 05/04/2008 [facility] Patient Information 2014 [place], [place].

## 2014-01-19 ENCOUNTER — Encounter (HOSPITAL_COMMUNITY): Payer: Self-pay | Admitting: Emergency Medicine

## 2014-01-19 ENCOUNTER — Observation Stay (HOSPITAL_COMMUNITY)
Admission: EM | Admit: 2014-01-19 | Discharge: 2014-01-21 | Disposition: A | Discharge status: Home / Self Care | Payer: BC Managed Care – PPO | Attending: Internal Medicine | Admitting: Internal Medicine

## 2014-01-19 ENCOUNTER — Emergency Department (HOSPITAL_COMMUNITY): Payer: BC Managed Care – PPO

## 2014-01-19 DIAGNOSIS — R0989 Other specified symptoms and signs involving the circulatory and respiratory systems: Secondary | ICD-10-CM | POA: Insufficient documentation

## 2014-01-19 DIAGNOSIS — I422 Other hypertrophic cardiomyopathy: Secondary | ICD-10-CM | POA: Insufficient documentation

## 2014-01-19 DIAGNOSIS — I5042 Chronic combined systolic (congestive) and diastolic (congestive) heart failure: Secondary | ICD-10-CM | POA: Insufficient documentation

## 2014-01-19 DIAGNOSIS — I4892 Unspecified atrial flutter: Secondary | ICD-10-CM | POA: Insufficient documentation

## 2014-01-19 DIAGNOSIS — E039 Hypothyroidism, unspecified: Secondary | ICD-10-CM | POA: Insufficient documentation

## 2014-01-19 DIAGNOSIS — R55 Syncope and collapse: Secondary | ICD-10-CM | POA: Insufficient documentation

## 2014-01-19 DIAGNOSIS — I319 Disease of pericardium, unspecified: Secondary | ICD-10-CM | POA: Insufficient documentation

## 2014-01-19 DIAGNOSIS — Z79899 Other long term (current) drug therapy: Secondary | ICD-10-CM | POA: Insufficient documentation

## 2014-01-19 DIAGNOSIS — I472 Ventricular tachycardia, unspecified: Principal | ICD-10-CM | POA: Insufficient documentation

## 2014-01-19 DIAGNOSIS — R0609 Other forms of dyspnea: Secondary | ICD-10-CM | POA: Insufficient documentation

## 2014-01-19 DIAGNOSIS — I471 Supraventricular tachycardia: Secondary | ICD-10-CM

## 2014-01-19 DIAGNOSIS — Z872 Personal history of diseases of the skin and subcutaneous tissue: Secondary | ICD-10-CM | POA: Insufficient documentation

## 2014-01-19 DIAGNOSIS — Z95810 Presence of automatic (implantable) cardiac defibrillator: Secondary | ICD-10-CM

## 2014-01-19 DIAGNOSIS — Z86718 Personal history of other venous thrombosis and embolism: Secondary | ICD-10-CM | POA: Insufficient documentation

## 2014-01-19 DIAGNOSIS — G43809 Other migraine, not intractable, without status migrainosus: Secondary | ICD-10-CM | POA: Insufficient documentation

## 2014-01-19 DIAGNOSIS — Z9889 Other specified postprocedural states: Secondary | ICD-10-CM | POA: Insufficient documentation

## 2014-01-19 DIAGNOSIS — D649 Anemia, unspecified: Secondary | ICD-10-CM | POA: Insufficient documentation

## 2014-01-19 DIAGNOSIS — I4891 Unspecified atrial fibrillation: Secondary | ICD-10-CM | POA: Insufficient documentation

## 2014-01-19 DIAGNOSIS — I4729 Other ventricular tachycardia: Principal | ICD-10-CM | POA: Insufficient documentation

## 2014-01-19 DIAGNOSIS — R110 Nausea: Secondary | ICD-10-CM | POA: Insufficient documentation

## 2014-01-19 DIAGNOSIS — I314 Cardiac tamponade: Secondary | ICD-10-CM | POA: Insufficient documentation

## 2014-01-19 DIAGNOSIS — IMO0002 Reserved for concepts with insufficient information to code with codable children: Secondary | ICD-10-CM | POA: Insufficient documentation

## 2014-01-19 DIAGNOSIS — F411 Generalized anxiety disorder: Secondary | ICD-10-CM | POA: Insufficient documentation

## 2014-01-19 DIAGNOSIS — K8020 Calculus of gallbladder without cholecystitis without obstruction: Secondary | ICD-10-CM | POA: Insufficient documentation

## 2014-01-19 DIAGNOSIS — I4901 Ventricular fibrillation: Secondary | ICD-10-CM | POA: Insufficient documentation

## 2014-01-19 DIAGNOSIS — Z7982 Long term (current) use of aspirin: Secondary | ICD-10-CM | POA: Insufficient documentation

## 2014-01-19 DIAGNOSIS — Z87891 Personal history of nicotine dependence: Secondary | ICD-10-CM | POA: Insufficient documentation

## 2014-01-19 DIAGNOSIS — J45909 Unspecified asthma, uncomplicated: Secondary | ICD-10-CM | POA: Insufficient documentation

## 2014-01-19 DIAGNOSIS — G8929 Other chronic pain: Secondary | ICD-10-CM | POA: Insufficient documentation

## 2014-01-19 DIAGNOSIS — Z8619 Personal history of other infectious and parasitic diseases: Secondary | ICD-10-CM | POA: Insufficient documentation

## 2014-01-19 DIAGNOSIS — G709 Myoneural disorder, unspecified: Secondary | ICD-10-CM | POA: Insufficient documentation

## 2014-01-19 DIAGNOSIS — I959 Hypotension, unspecified: Secondary | ICD-10-CM | POA: Insufficient documentation

## 2014-01-19 LAB — BASIC METABOLIC PANEL
BUN: 18 mg/dL (ref 6–23)
CALCIUM: 9.5 mg/dL (ref 8.4–10.5)
CO2: 24 meq/L (ref 19–32)
CREATININE: 1.03 mg/dL (ref 0.50–1.10)
Chloride: 100 mEq/L (ref 96–112)
GFR calc Af Amer: 76 mL/min — ABNORMAL LOW (ref >90)
GFR, EST NON AFRICAN AMERICAN: 66 mL/min — AB (ref >90)
GLUCOSE: 112 mg/dL — AB (ref 70–99)
Potassium: 4.2 mEq/L (ref 3.7–5.3)
Sodium: 136 mEq/L — ABNORMAL LOW (ref 137–147)

## 2014-01-19 LAB — CBC WITH DIFFERENTIAL/PLATELET
BASOS ABS: 0 10*3/uL (ref 0.0–0.1)
Basophils Relative: 1 % (ref 0–1)
EOS ABS: 0.3 10*3/uL (ref 0.0–0.7)
EOS PCT: 3 % (ref 0–5)
HCT: 43.8 % (ref 36.0–46.0)
Hemoglobin: 14.3 g/dL (ref 12.0–15.0)
Lymphocytes Relative: 18 % (ref 12–46)
Lymphs Abs: 1.5 10*3/uL (ref 0.7–4.0)
MCH: 28.8 pg (ref 26.0–34.0)
MCHC: 32.6 g/dL (ref 30.0–36.0)
MCV: 88.3 fL (ref 78.0–100.0)
Monocytes Absolute: 0.6 10*3/uL (ref 0.1–1.0)
Monocytes Relative: 8 % (ref 3–12)
NEUTROS PCT: 70 % (ref 43–77)
Neutro Abs: 5.6 10*3/uL (ref 1.7–7.7)
PLATELETS: 239 10*3/uL (ref 150–400)
RBC: 4.96 MIL/uL (ref 3.87–5.11)
RDW: 14.1 % (ref 11.5–15.5)
WBC: 8 10*3/uL (ref 4.0–10.5)

## 2014-01-19 LAB — MAGNESIUM: MAGNESIUM: 2 mg/dL (ref 1.5–2.5)

## 2014-01-19 LAB — I-STAT TROPONIN, ED: Troponin i, poc: 0.04 ng/mL (ref 0.00–0.08)

## 2014-01-19 MED ORDER — ASPIRIN EC 81 MG PO TBEC
81.0000 mg | DELAYED_RELEASE_TABLET | Freq: Every day | ORAL | Status: DC
  Administered 2014-01-19 – 2014-01-21 (×2): 81 mg via ORAL
  Filled 2014-01-19 (×4): qty 1

## 2014-01-19 MED ORDER — ALPRAZOLAM 0.5 MG PO TABS
0.5000 mg | ORAL_TABLET | Freq: Two times a day (BID) | ORAL | Status: DC | PRN
  Administered 2014-01-19: 0.5 mg via ORAL
  Filled 2014-01-19: qty 1

## 2014-01-19 MED ORDER — SODIUM CHLORIDE 0.9 % IJ SOLN
3.0000 mL | Freq: Two times a day (BID) | INTRAMUSCULAR | Status: DC
  Administered 2014-01-19 – 2014-01-21 (×3): 3 mL via INTRAVENOUS

## 2014-01-19 MED ORDER — OXYCODONE-ACETAMINOPHEN 5-325 MG PO TABS: 1.0000 | ORAL_TABLET | Freq: Three times a day (TID) | ORAL | Status: DC | PRN

## 2014-01-19 MED ORDER — FUROSEMIDE 20 MG PO TABS: 40.0000 mg | ORAL_TABLET | Freq: Every day | ORAL | Status: DC

## 2014-01-19 MED ORDER — METOPROLOL TARTRATE 1 MG/ML IV SOLN
5.0000 mg | Freq: Once | INTRAVENOUS | Status: DC
  Filled 2014-01-19: qty 5

## 2014-01-19 MED ORDER — RANOLAZINE ER 500 MG PO TB12
1000.0000 mg | ORAL_TABLET | Freq: Two times a day (BID) | ORAL | Status: DC
  Administered 2014-01-19 – 2014-01-21 (×5): 1000 mg via ORAL
  Filled 2014-01-19 (×8): qty 2

## 2014-01-19 MED ORDER — MEXILETINE HCL 150 MG PO CAPS
300.0000 mg | ORAL_CAPSULE | Freq: Three times a day (TID) | ORAL | Status: DC
  Administered 2014-01-19 – 2014-01-21 (×6): 300 mg via ORAL
  Filled 2014-01-19 (×9): qty 2

## 2014-01-19 MED ORDER — METOPROLOL SUCCINATE ER 25 MG PO TB24
25.0000 mg | ORAL_TABLET | Freq: Every day | ORAL | Status: DC
  Administered 2014-01-19 – 2014-01-21 (×2): 25 mg via ORAL
  Filled 2014-01-19 (×3): qty 1

## 2014-01-19 MED ORDER — LEVOTHYROXINE SODIUM 50 MCG PO TABS
50.0000 ug | ORAL_TABLET | Freq: Every day | ORAL | Status: DC
  Administered 2014-01-19 – 2014-01-21 (×3): 50 ug via ORAL
  Filled 2014-01-19 (×4): qty 1

## 2014-01-19 NOTE — ED Provider Notes (Signed)
CSN: [id number]     Arrival date & time 01/19/14  0000 History   First MD Initiated Contact with Patient 01/19/14 0005     Chief Complaint  Patient presents with  . Palpitations     (Consider location/radiation/quality/duration/timing/severity/associated sxs/prior Treatment) Patient is a 43 y.o. female presenting with palpitations. The history is provided by the patient.  Palpitations She had onset at about 11 PM of palpitations. This is associated with feeling generally weak and sweaty. There is associated nausea and mild dyspnea. She denies chest pain, heaviness, tightness, pressure. She thinks that she was in ventricular tachycardia. She has an implanted defibrillator but it did not discharge. Symptoms have basically resolved on arrival to the ED, but she still feels weak. Nothing made symptoms better nothing made them worse.  Past Medical History  Diagnosis Date  . Hypertrophic cardiomyopathy     s/p septal myomectomy with implantable defibrillator 01/27/2012   . Anxiety state, unspecified   . Allergic rhinitis     to dogs  . Asthma   . Anemia     during pregnancy  . History of chicken pox   . Generalized headaches   . Ocular migraine     no HA  . Back pain     told cracked bone, vicodin didn't help, improved with percocets/muscle relaxants, no eval by prior PCP  . 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 DVT (deep venous thrombosis) 01/2012    was placed on Warfarin after cardiac surgery but had skin reaction to warfarin so was changed to Pradaxa so DVT occurred while on Pradaxa although unclear if it occurred during transition  . Hypotension   . Atrial fibrillation     a. On pradaxa.  coumadin necrosis with warfarin;  b. Amio d/c'd 02/2012  . Pericardial effusion   . Chronic combined systolic and diastolic CHF (congestive heart failure)     a. echo 03/19/12: severe asymmetric septal hypertrophy, evidence of upper septal  myomectomy, peak LV outflow tract gradient 35, EF 35%,;  b.  Echo 9/13: Severe LVH, EF 30-35%, anteroseptal and inferior HK, LVOT narrow, mild AI, mild to moderate mitral stenosis, severe LAE  . Pericardial tamponade 03/14/2012  . 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 01/2012 s/p BSX ICD;  b. 06/2012 Multaq initiated due to recurrent syncope, VT/VF - had severe n/v with IV amio.  . Skin necrosis --COUMADIN   . Chronic chest wall pain     eval by CVTS, stable, rec return to [facility] if continued pain  . Syncope     a. in setting of VT/VF.  Marland Kitchen Hypothyroid   . Blood transfusion without reported diagnosis   . Neuromuscular disorder   . Gall stones   . Atrial flutter 10/2013    with inappropriate ICD therapy; s/p CTI ablation by Dr Ladona Ridgel 11/16/2013   Past Surgical History  Procedure Laterality Date  . Cesarean section  1991  . Induced abortion  1990 and 1993  . Cardiac surgery  01/27/2012    median sternotomy, septal myectomy, MVR - [facility] (Dr. Silvestre Mesi)  . Cardiac defibrillator placement  01/2012    BSX dual chamber ICD  . Myomectomy    . Pericardial window  03/14/2012    Procedure: PERICARDIAL WINDOW;  Surgeon: Purcell Nails, MD;  Location: [facility] OR;  Service: Open Heart Surgery;  Laterality: N/A;  Subxyphoid Pericardial  Window   . Ablation  11-16-13  RFCA of atrial flutter by Dr Ladona Ridgelaylor    Family History  Problem Relation Age of Onset  . Diabetes Father   . Heart disease Father     unspecified heart problem  . Thyroid disease Mother   . Anemia Mother     mediterranean anemia, ITP  . Heart disease Maternal Grandfather     CHF  . Diabetes Paternal Grandfather   . Cancer Paternal Grandfather   . Coronary artery disease Neg Hx   . Stroke Neg Hx   . Sudden death Neg Hx    History  Substance Use Topics  . Smoking status: Former Smoker -- 1.00 packs/day for 3 years    Types: Cigarettes    Quit date: 09/30/1988  . Smokeless tobacco: Never Used     Comment: quitin 1990  . Alcohol  Use: No     Comment: none since "months" as of 02/28/2013   OB History   Grav Para Term Preterm Abortions TAB SAB Ect Mult Living                 Review of Systems  Cardiovascular: Positive for palpitations.  All other systems reviewed and are negative.     Allergies  Phenergan; Amiodarone; Nitroglycerin; and Warfarin and related  Home Medications   Prior to Admission medications   Medication Sig Start Date End Date Taking? Authorizing Provider  acetaminophen (TYLENOL) 500 MG tablet Take 500-1,000 mg by mouth 2 (two) times daily as needed (pain).     Historical Provider, MD  albuterol (PROVENTIL HFA;VENTOLIN HFA) 108 (90 BASE) MCG/ACT inhaler Inhale 1 puff into the lungs 3 (three) times daily as needed for wheezing or shortness of breath.    Historical Provider, MD  ALPRAZolam Prudy Feeler(XANAX) 1 MG tablet Take 1 tablet (1 mg total) by mouth 3 (three) times daily as needed for anxiety. 12/13/13   Eustaquio BoydenJavier Gutierrez, MD  amoxicillin (AMOXIL) 500 MG capsule Take 4 capsules (2,000 mg total) by mouth once. 1 hour prior to dental procedure 12/13/13   Eustaquio BoydenJavier Gutierrez, MD  aspirin EC 81 MG tablet Take 81 mg by mouth daily.    Historical Provider, MD  beclomethasone (QVAR) 80 MCG/ACT inhaler Inhale 1 puff into the lungs 2 (two) times daily.    Historical Provider, MD  cetirizine (ZYRTEC) 10 MG tablet Take 10 mg by mouth daily.     Historical Provider, MD  Docusate Calcium (STOOL SOFTENER PO) Take 1 capsule by mouth daily.    Historical Provider, MD  furosemide (LASIX) 40 MG tablet Take 1 tablet (40 mg total) by mouth daily. 12/10/13   Tonny BollmanMichael Cooper, MD  gabapentin (NEURONTIN) 300 MG capsule Take 1 capsule (300 mg total) by mouth 3 (three) times daily. 08/03/13   Adam Gus Rankinobert Jaffe, DO  levothyroxine (SYNTHROID, LEVOTHROID) 50 MCG tablet Take 50 mcg by mouth daily before breakfast.  04/09/13 04/09/14  Historical Provider, MD  magnesium gluconate (MAGONATE) 500 MG tablet Take 250 mg by mouth 2 (two) times daily.     Historical Provider, MD  metoprolol succinate (TOPROL-XL) 25 MG 24 hr tablet Take 1 tablet (25 mg total) by mouth daily. 11/22/13 11/22/14  Tonny BollmanMichael Cooper, MD  mexiletine (MEXITIL) 150 MG capsule Take 2 capsules (300 mg total) by mouth 3 (three) times daily. 10/12/13 10/12/14  Marinus MawGregg W Taylor, MD  oxyCODONE-acetaminophen (PERCOCET/ROXICET) 5-325 MG per tablet Take 1 tablet by mouth 3 (three) times daily as needed for severe pain (pain).    Historical Provider, MD  polyethylene glycol powder (  GLYCOLAX/MIRALAX) powder Take 17 g by mouth daily as needed (constipation).  10/01/13   Historical Provider, MD  Potassium Gluconate 595 MG CAPS Take 595 mg by mouth daily.    Historical Provider, MD  Probiotic Product (PROBIOTIC PO) Take 1 tablet by mouth daily.     Historical Provider, MD  ranolazine (RANEXA) 1000 MG SR tablet Take 1 tablet (1,000 mg total) by mouth 2 (two) times daily. 12/13/13 12/13/14  Tonny Bollman, MD   BP 113/60  Pulse 77  Resp 20  SpO2 100%  LMP 12/28/2013 Physical Exam  Nursing note and vitals reviewed.  43 year old female, resting comfortably and in no acute distress. Vital signs are normal. Oxygen saturation is 100%, which is normal. Head is normocephalic and atraumatic. PERRLA, EOMI. Oropharynx is clear. Neck is nontender and supple without adenopathy or JVD. Back is nontender and there is no CVA tenderness. Lungs are clear without rales, wheezes, or rhonchi. Chest is nontender. Implanted defibrillator is present in the left subclavian area. Heart has regular rate and rhythm without murmur. Abdomen is soft, flat, nontender without masses or hepatosplenomegaly and peristalsis is normoactive. Extremities have no cyanosis or edema, full range of motion is present. Skin is warm and dry without rash. Neurologic: Mental status is normal, cranial nerves are intact, there are no motor or sensory deficits.  ED Course  Procedures (including critical care time) Labs Review Results  for orders placed during the hospital encounter of 01/19/14  BASIC METABOLIC PANEL      Result Value Ref Range   Sodium 136 (*) 137 - 147 mEq/L   Potassium 4.2  3.7 - 5.3 mEq/L   Chloride 100  96 - 112 mEq/L   CO2 24  19 - 32 mEq/L   Glucose, Bld 112 (*) 70 - 99 mg/dL   BUN 18  6 - 23 mg/dL   Creatinine, Ser 0.45  0.50 - 1.10 mg/dL   Calcium 9.5  8.4 - 40.9 mg/dL   GFR calc non Af Amer 66 (*) >90 mL/min   GFR calc Af Amer 76 (*) >90 mL/min  CBC WITH DIFFERENTIAL      Result Value Ref Range   WBC 8.0  4.0 - 10.5 K/uL   RBC 4.96  3.87 - 5.11 MIL/uL   Hemoglobin 14.3  12.0 - 15.0 g/dL   HCT 81.1  91.4 - 78.2 %   MCV 88.3  78.0 - 100.0 fL   MCH 28.8  26.0 - 34.0 pg   MCHC 32.6  30.0 - 36.0 g/dL   RDW 95.6  21.3 - 08.6 %   Platelets 239  150 - 400 K/uL   Neutrophils Relative % 70  43 - 77 %   Neutro Abs 5.6  1.7 - 7.7 K/uL   Lymphocytes Relative 18  12 - 46 %   Lymphs Abs 1.5  0.7 - 4.0 K/uL   Monocytes Relative 8  3 - 12 %   Monocytes Absolute 0.6  0.1 - 1.0 K/uL   Eosinophils Relative 3  0 - 5 %   Eosinophils Absolute 0.3  0.0 - 0.7 K/uL   Basophils Relative 1  0 - 1 %   Basophils Absolute 0.0  0.0 - 0.1 K/uL  MAGNESIUM      Result Value Ref Range   Magnesium 2.0  1.5 - 2.5 mg/dL  I-STAT TROPOININ, ED      Result Value Ref Range   Troponin i, poc 0.04  0.00 - 0.08 ng/mL  Comment 3            Imaging Review Dg Chest Port 1 View  01/19/2014   CLINICAL DATA:  Palpitation  EXAM: PORTABLE CHEST - 1 VIEW  COMPARISON:  DG CHEST 2 VIEW dated 01/06/2014; DG CHEST 2 VIEW dated 10/05/2012  FINDINGS: Left subclavian AICD device and leads are stable. Clear lungs. Low volumes. Normal heart size.  IMPRESSION: No active disease.   Electronically Signed   By: Maryclare BeanArt  Hoss M.D.   On: 01/19/2014 00:28     EKG Interpretation   Date/Time:  Wednesday January 19 2014 00:07:58 EDT Ventricular Rate:  75 PR Interval:  221 QRS Duration: 180 QT Interval:  476 QTC Calculation: 532 R Axis:   -49 Text  Interpretation:  Sinus rhythm Prolonged PR interval Left bundle  branch block Left axis deviation When compared with ECG of 05/08/2014, Left  axis deviation is now Present Confirmed by [facility]  MD, Pastor Sgro ([id number]) on  01/19/2014 12:15:35 AM      MDM   Final diagnoses:  VT (ventricular tachycardia)  Atrial fibrillation  Pre-syncope    Symptomatic palpitations. While I was in the ED, patient stated she started feeling the same way and monitor rhythm showed atrial fibrillation. I reviewed her old records and she has had episodes of atrial flutter and ventricular tachycardia both of which were treated with ablations with incomplete success. She had an ED visit for palpitations on April 9. When she was in atrial fibrillation, heart rate did get up as high as 130. It is noted that she is on a relatively low dose of metoprolol and she be given a dose of metoprolol to try and control her heart rate when she does go back into atrial fibrillation. Her device will also be interrogated.  Interrogation of the device shows that she has she did have ventricular tachycardia which was successfully slowed with ATP. Because she had sustained ventricular tachycardia, cardiology consultation was obtained. Patient was seen by Dr. Jon BillingsSivak and made arrangements for hospitalization.  Dione Boozeavid Viren Lebeau, MD 01/19/14 [phone]

## 2014-01-19 NOTE — Progress Notes (Signed)
Patient: [name] Date of Encounter: 01/19/2014, 7:31 AM Admit date: 01/19/2014     Subjective  [name] denies any new complaints this AM. Yesterday felt "flushing, hot" then "very fast, racing" palpitations accompanied by SOB and near syncope. No CHF symptoms. Tearful and anxious. Reports little family support at home and is struggling with loss of her independence (ie-no driving) and recurrent hospitalizations / ED visits. She is compliant with medications.   Objective  Physical Exam: Vitals: BP 99/65  Pulse 73  Temp(Src) 98 F (36.7 C) (Oral)  Resp 16  Ht 5' (1.524 m)  Wt 212 lb 3.2 oz (96.253 kg)  BMI 41.44 kg/m2  SpO2 99%  LMP 12/28/2013 General: Well developed 43 year old female in no acute distress. Neck: Supple. JVD not elevated. Lungs: Clear bilaterally to auscultation without wheezes, rales, or rhonchi. Breathing is unlabored. Heart: RRR S1 S2 without murmurs, rubs, or gallops.  Abdomen: Soft, non-distended. Extremities: No clubbing or cyanosis. No edema.  Distal pedal pulses are 2+ and equal bilaterally. Neuro: Alert and oriented X 3. Moves all extremities spontaneously. No focal deficits.  Intake/Output: No intake or output data in the 24 hours ending 01/19/14 0731  Inpatient Medications:  . aspirin EC  81 mg Oral Daily  . levothyroxine  50 mcg Oral QAC breakfast  . metoprolol  5 mg Intravenous Once  . metoprolol succinate  25 mg Oral Daily  . mexiletine  300 mg Oral TID  . ranolazine  1,000 mg Oral BID  . sodium chloride  3 mL Intravenous Q12H    Labs:  Recent Labs  01/19/14 0038  NA 136*  K 4.2  CL 100  CO2 24  GLUCOSE 112*  BUN 18  CREATININE 1.03  CALCIUM 9.5  MG 2.0    Recent Labs  01/19/14 0037  WBC 8.0  NEUTROABS 5.6  HGB 14.3  HCT 43.8  MCV 88.3  PLT 239    Radiology/Studies: Dg Chest Port 1 View 01/19/2014    FINDINGS: Left subclavian AICD device and leads are stable. Clear lungs. Low volumes. Normal heart size.     IMPRESSION: No active disease.    Electronically Signed   By: Maryclare BeanArt  Hoss M.D.   On: 01/19/2014 00:28   Echocardiogram 12/08/2013 Study Conclusions - Left ventricle: HOCM with previous septal myectomy The cavity size was mildly dilated. Wall thickness was increased in a pattern of moderate LVH. Systolic function was mildly to moderately reduced. The estimated ejection fraction was in the range of 40% to 45%. - Aortic valve: Trivial regurgitation. - Mitral valve: S/P repair with small diastolic gradient no MS Trivial MR - Left atrium: The atrium was moderately dilated. - Atrial septum: No defect or patent foramen ovale was identified.  Telemetry: A paced V sensed; brief atrial tachycardia / atrial fibrillation in early AM hours Device interrogation: today - normal ICD function with good battery status and stable lead measurements (impedance, sensing and thresholds); dual tachycardia evident last night; sustained monomorphic VT (CL 318 msec) onset at 22:09 with ATP x 2 which failed to terminate rhythm but slowed VT to below detection rate / zone; no shock delivered   Assessment and Plan  [name] has a complex cardiac history including HCM s/p myomectomy , paroxysmal VT [facility] and PAF. She has failed medical therapy with multiple antiarrhythmic medications. She is currently taking mexiletine, ranolazine and metoprolol as [facility] recommendations. She underwent EPS +RF ablation of atrial flutter after  being admitted with inappropriate ICD shocks in Feb 2015. She presented yesterday with racing palpitations and near syncope.   1. Ventricular tachycardia  - with ICD in place [facility]) - [facility] - question need for tachy detection settings changes (ie-lower VT rate); however, with history of inappropriate shocks and evidence of PAT and PAF will have to follow carefully - unable to tolerate amiodarone in the past 2.  Paroxysmal atrial tachycardia and atrial fibrillation - recent atrial flutter ablation but also with known PAF and PAT - dual tachycardias on device interrogation - history of inappropriate shocks for atrial tachycardia 3. HCM s/p myomectomy 4. NICM, EF 40-45% 5. Increased psychosocial stressors and little family support - doesn't feel threatened or in danger but states "no one understands" and her mother, siblings and daughter will not speak to her - will ask Social Work to assist with counseling and identifying community resources for help  Signed, Minda MeoBrooke O Edmisten PA-C  EP Attending  Patient seen and examined. Agree with the above history, physical exam assessment and plan as amended. She has a complicated and difficult past medical history. She has no good options for arrhythmia prevention. She is a candidate for repeat VT ablation but likely long term success is hard to know. Her device interogation demonstrated that she was in an atrial tachy with a RVR (1:1) and then spontaneously went into VT, which accelerated, going faster than her atrial flutter/tachycardia. Then the atrial rate accelerated. ATP did not terminate her arrythmia but it eventually slowed and broke. Will try to reprogram device for arrhythmia discrimination in place. I am loath to add another/different AA drug at this time.  Leonia ReevesGregg Nikitta Sobiech,M.D.

## 2014-01-19 NOTE — ED Notes (Signed)
One IV attempt made in Pt's right anterior forearm.  Per PT request IV team Paged

## 2014-01-19 NOTE — ED Notes (Signed)
Pt sattes that just before 2300 tonight she started feeling weak and a fluttering feeling in her chest with some SOB

## 2014-01-19 NOTE — ED Notes (Signed)
Pt reports episode of palpitations while at home this evening, per family pt was unresponsive for a short period of time - pt also admits to some SOB during that time. Pt w/ AICD and demand pacemaker. Pt is A&Ox4, in no acute distress, denies pain at present.

## 2014-01-19 NOTE — H&P (Signed)
Admission History and Physical     Patient ID: [name], MRN: [id number], DOB: 09/15/1971 43 y.o. Date of Encounter: 01/19/2014, 2:37 AM  Primary Physician: Eustaquio BoydenJavier Gutierrez, MD Primary Cardiologist:Taylor, Sharlot GowdaGregg  Chief Complaint:  presyncope  History of Present Illness: [name] is a 43 y.o. female HOCM s/p myomecty, VT s/p ICD s/p ablation, afib/flutter s/p ablation who [facility] with an episode of presyncope and palpitations.  Recent history includes ER visit for VT treated by device on 01/07/14.  Prior to this had admission in February for multiple ICD shocks, found to be due to aflutter and underwent ablation.  Prior to that in June 2014, underwent VT ablation which reported was unsuccessful. Started on mexilitine and ranexa at that time.   Tonight she had the episode of palpitations which caused her to feel warm and flushed, similar to her episode on 4/10. On arrival to the ED she had another episode and was found to be in a narrow complex tachycardia on the monitor.  Her device was interrogated which showed VT that was treated by ATP.  The ATP did not terminate the VT but slowed it to below her treatment threshold zone.    She feels back to her baseline now but is concerned about recent increase in arrythmias.  Also has upcoming cholecystectomy and is worried about how this will affect her surgery.      Past Medical History  Diagnosis Date  . Hypertrophic cardiomyopathy     s/p septal myomectomy with implantable defibrillator 01/27/2012   . Anxiety state, unspecified   . Allergic rhinitis     to dogs  . Asthma   . Anemia     during pregnancy  . History of chicken pox   . Generalized headaches   . Ocular migraine     no HA  . Back pain     told cracked bone, vicodin didn't help, improved with percocets/muscle relaxants, no eval by prior PCP  . 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 DVT (deep venous thrombosis) 01/2012    was placed on Warfarin after cardiac surgery but had skin reaction to warfarin so was changed to Pradaxa so DVT occurred while on Pradaxa although unclear if it occurred during transition  . Hypotension   . Atrial fibrillation     a. On pradaxa.  coumadin necrosis with warfarin;  b. Amio d/c'd 02/2012  . Pericardial effusion   . Chronic combined systolic and diastolic CHF (congestive heart failure)     a. echo 03/19/12: severe asymmetric septal hypertrophy, evidence of upper septal myomectomy, peak LV outflow tract gradient 35, EF 35%,;  b.  Echo 9/13: Severe LVH, EF 30-35%, anteroseptal and inferior HK, LVOT narrow, mild AI, mild to moderate mitral stenosis, severe LAE  . Pericardial tamponade 03/14/2012  . 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 01/2012 s/p BSX ICD;  b. 06/2012 Multaq initiated due to recurrent syncope, VT/VF - had severe n/v with IV amio.  . Skin necrosis --COUMADIN   . Chronic chest wall pain     eval by CVTS, stable, [facility]  . Syncope     a. in setting of VT/VF.  Marland Kitchen. Hypothyroid   . Blood transfusion without reported diagnosis   . Neuromuscular disorder   . Gall stones   . Atrial flutter 10/2013    with  inappropriate ICD therapy; s/p CTI ablation by Dr Ladona Ridgelaylor 11/16/2013     Past Surgical History  Procedure Laterality Date  . Cesarean section  1991  . Induced abortion  1990 and 1993  . Cardiac surgery  01/27/2012    median sternotomy, septal myectomy, MVR - [facility] (Dr. Silvestre MesiGlower)  . Cardiac defibrillator placement  01/2012    BSX dual chamber ICD  . Myomectomy    . Pericardial window  03/14/2012    Procedure: PERICARDIAL WINDOW;  Surgeon: Purcell Nailslarence H Owen, MD;  [facility];  Service: Open Heart Surgery;  Laterality: N/A;  Subxyphoid Pericardial  Window   . Ablation  11-16-13    RFCA of atrial flutter by Dr Ladona Ridgelaylor       Current Facility-Administered Medications  Medication Dose Route  Frequency Provider Last Rate Last Dose  . aspirin EC tablet 81 mg  81 mg Oral Daily Yaakov GuthrieJoseph Terrika Zuver, MD      . furosemide (LASIX) tablet 40 mg  40 mg Oral Daily Yaakov GuthrieJoseph Rhyland Hinderliter, MD      . levothyroxine (SYNTHROID, LEVOTHROID) tablet 50 mcg  50 mcg Oral QAC breakfast Yaakov GuthrieJoseph Shada Nienaber, MD      . metoprolol (LOPRESSOR) injection 5 mg  5 mg Intravenous Once Dione Boozeavid Glick, MD      . metoprolol succinate (TOPROL-XL) 24 hr tablet 25 mg  25 mg Oral Daily Yaakov GuthrieJoseph Advaith Lamarque, MD      . mexiletine (MEXITIL) capsule 300 mg  300 mg Oral TID Yaakov GuthrieJoseph Gerardo Territo, MD      . oxyCODONE-acetaminophen (PERCOCET/ROXICET) 5-325 MG per tablet 1 tablet  1 tablet Oral TID PRN Yaakov GuthrieJoseph Aasim Restivo, MD      . ranolazine (RANEXA) 12 hr tablet 1,000 mg  1,000 mg Oral BID Yaakov GuthrieJoseph Mercie Balsley, MD      . sodium chloride 0.9 % injection 3 mL  3 mL Intravenous Q12H Yaakov GuthrieJoseph Latha Staunton, MD       Current Outpatient Prescriptions  Medication Sig Dispense Refill  . acetaminophen (TYLENOL) 500 MG tablet Take 500-1,000 mg by mouth 2 (two) times daily as needed (pain).       Marland Kitchen. albuterol (PROVENTIL HFA;VENTOLIN HFA) 108 (90 BASE) MCG/ACT inhaler Inhale 1 puff into the lungs 3 (three) times daily as needed for wheezing or shortness of breath.      . ALPRAZolam (XANAX) 1 MG tablet Take 1 tablet (1 mg total) by mouth 3 (three) times daily as needed for anxiety.  90 tablet  0  . amoxicillin (AMOXIL) 500 MG capsule Take 4 capsules (2,000 mg total) by mouth once. 1 hour prior to dental procedure  4 capsule  0  . aspirin EC 81 MG tablet Take 81 mg by mouth daily.      . beclomethasone (QVAR) 80 MCG/ACT inhaler Inhale 1 puff into the lungs 2 (two) times daily.      . cetirizine (ZYRTEC) 10 MG tablet Take 10 mg by mouth daily.       Tery Sanfilippo. Docusate Calcium (STOOL SOFTENER PO) Take 1 capsule by mouth daily.      . furosemide (LASIX) 40 MG tablet Take 1 tablet (40 mg total) by mouth daily.  30 tablet  5  . gabapentin (NEURONTIN) 300 MG capsule Take 1 capsule (300 mg total) by mouth 3 (three) times  daily.  90 capsule  6  . levothyroxine (SYNTHROID, LEVOTHROID) 50 MCG tablet Take 50 mcg by mouth daily before breakfast.       . magnesium gluconate (MAGONATE) 500 MG tablet Take 250 mg  by mouth 2 (two) times daily.      . metoprolol succinate (TOPROL-XL) 25 MG 24 hr tablet Take 1 tablet (25 mg total) by mouth daily.  30 tablet  6  . mexiletine (MEXITIL) 150 MG capsule Take 2 capsules (300 mg total) by mouth 3 (three) times daily.  180 capsule  6  . oxyCODONE-acetaminophen (PERCOCET/ROXICET) 5-325 MG per tablet Take 1 tablet by mouth 3 (three) times daily as needed for severe pain (pain).      . polyethylene glycol powder (GLYCOLAX/MIRALAX) powder Take 17 g by mouth daily as needed (constipation).       . Potassium Gluconate 595 MG CAPS Take 595 mg by mouth daily.      . Probiotic Product (PROBIOTIC PO) Take 1 tablet by mouth daily.       . ranolazine (RANEXA) 1000 MG SR tablet Take 1 tablet (1,000 mg total) by mouth 2 (two) times daily.  180 tablet  3      Allergies: Allergies  Allergen Reactions  . Phenergan [Promethazine Hcl] Itching  . Warfarin And Related Other (See Comments)    'burns my skin'  . Amiodarone Nausea And Vomiting    Amiodarone tablets cause nausea Can tolerate IV Amiodarone  . Nitroglycerin Other (See Comments)    Hypotension     Social History:  The patient  reports that she quit smoking about 25 years ago. Her smoking use included Cigarettes. She has a 3 pack-year smoking history. She has never used smokeless tobacco. She reports that she does not drink alcohol or use illicit drugs.   Family History:  The patient's family history includes Anemia in her mother; Cancer in her paternal grandfather; Diabetes in her father and paternal grandfather; Heart disease in her father and maternal grandfather; Thyroid disease in her mother. There is no history of Coronary artery disease, Stroke, or Sudden death.   ROS:  Please see the history of present illness.     All other  systems reviewed and negative.   Vital Signs: Blood pressure 91/62, pulse 73, resp. rate 19, last menstrual period 12/28/2013, SpO2 99.00%.  PHYSICAL EXAM: General:  Well nourished, well developed, in no acute distress HEENT: normal Lymph: no adenopathy Neck: no JVD Endocrine:  No thryomegaly Vascular: No carotid bruits; FA pulses 2+ bilaterally without bruits Cardiac:  normal S1, S2; RRR; SEM heard along LSB Lungs:  clear to auscultation bilaterally, no wheezing, rhonchi or rales Abd: soft, nontender, no hepatomegaly Ext: no edema Musculoskeletal:  No deformities, BUE and BLE strength normal and equal Skin: warm and dry Neuro:  CNs 2-12 intact, no focal abnormalities noted Psych:  Normal affect   EKG:  NSR with LBBB  Labs:   Lab Results  Component Value Date   WBC 8.0 01/19/2014   HGB 14.3 01/19/2014   HCT 43.8 01/19/2014   MCV 88.3 01/19/2014   PLT 239 01/19/2014    Recent Labs Lab 01/19/14 0038  NA 136*  K 4.2  CL 100  CO2 24  BUN 18  CREATININE 1.03  CALCIUM 9.5  GLUCOSE 112*   No results found for this basename: CKTOTAL, CKMB, TROPONINI,  in the last 72 hours Lab Results  Component Value Date   CHOL 192 08/16/2013   HDL 49.10 08/16/2013   LDLCALC 113* 08/16/2013   TRIG 152.0* 08/16/2013   Lab Results  Component Value Date   DDIMER 2.08* 03/09/2012    Radiology/Studies:  Dg Chest 2 View  01/07/2014   CLINICAL DATA:  Chest pain  EXAM: CHEST  2 VIEW  COMPARISON:  11/27/2013  FINDINGS: Dual-chamber right approach ICD/ pacer. Unchanged lead orientation, including lead entering the azygos system. Unchanged cardiomegaly. Remote median sternotomy. No edema or consolidation. No effusion or pneumothorax.  IMPRESSION: Stable exam.  No active cardiopulmonary disease.   Electronically Signed   By: Tiburcio Pea M.D.   On: 01/07/2014 00:06   Dg Chest Port 1 View  01/19/2014   CLINICAL DATA:  Palpitation  EXAM: PORTABLE CHEST - 1 VIEW  COMPARISON:  DG CHEST 2 VIEW  dated 01/06/2014; DG CHEST 2 VIEW dated 10/05/2012  FINDINGS: Left subclavian AICD device and leads are stable. Clear lungs. Low volumes. Normal heart size.  IMPRESSION: No active disease.   Electronically Signed   By: Maryclare Bean M.D.   On: 01/19/2014 00:28     ASSESSMENT AND PLAN:   1. VT/SVT - She is on maximally tolerated dose of metoprolol (limited by BP), along with ranexa and mexiletine.   - Will monitor her in observation overnight for additional events.  - She may be a bit dry, will hold lasix.  Signed,  Yaakov Guthrie, MD 01/19/2014, 2:37 AM

## 2014-01-19 NOTE — Progress Notes (Signed)
UR completed 

## 2014-01-20 DIAGNOSIS — I422 Other hypertrophic cardiomyopathy: Secondary | ICD-10-CM

## 2014-01-20 DIAGNOSIS — I498 Other specified cardiac arrhythmias: Secondary | ICD-10-CM

## 2014-01-20 DIAGNOSIS — Z95810 Presence of automatic (implantable) cardiac defibrillator: Secondary | ICD-10-CM

## 2014-01-20 NOTE — Progress Notes (Signed)
Patient: [name] A Geibel Date of Encounter: 01/20/2014, 1:41 PM Admit date: 01/19/2014     Subjective  Ms. Dittmar denies any new complaints this AM.   Objective  Physical Exam: Vitals: BP 110/61  Pulse 75  Temp(Src) 98 F (36.7 C) (Oral)  Resp 18  Ht 5' (1.524 m)  Wt 212 lb 5.6 oz (96.322 kg)  BMI 41.47 kg/m2  SpO2 99%  LMP 12/28/2013 General: Well developed 43 year old female in no acute distress. Neck: Supple. JVD not elevated. Lungs: Clear bilaterally to auscultation without wheezes, rales, or rhonchi. Breathing is unlabored. Heart: RRR S1 S2 with II/VI systolic murmur along LUSB. No rub or gallop.  Abdomen: Soft, non-distended. Extremities: No clubbing or cyanosis. No edema.  Distal pedal pulses are 2+ and equal bilaterally. Neuro: Alert and oriented X 3. Moves all extremities spontaneously. No focal deficits.  Intake/Output:  Intake/Output Summary (Last 24 hours) at 01/20/14 1341 Last data filed at 01/20/14 0903  Gross per 24 hour  Intake    100 ml  Output    250 ml  Net   -150 ml    Inpatient Medications:  . aspirin EC  81 mg Oral Daily  . levothyroxine  50 mcg Oral QAC breakfast  . metoprolol  5 mg Intravenous Once  . metoprolol succinate  25 mg Oral Daily  . mexiletine  300 mg Oral TID  . ranolazine  1,000 mg Oral BID  . sodium chloride  3 mL Intravenous Q12H    Labs:  Recent Labs  01/19/14 0038  NA 136*  K 4.2  CL 100  CO2 24  GLUCOSE 112*  BUN 18  CREATININE 1.03  CALCIUM 9.5  MG 2.0    Recent Labs  01/19/14 0037  WBC 8.0  NEUTROABS 5.6  HGB 14.3  HCT 43.8  MCV 88.3  PLT 239    Radiology/Studies: Dg Chest Port 1 View 01/19/2014    FINDINGS: Left subclavian AICD device and leads are stable. Clear lungs. Low volumes. Normal heart size.   IMPRESSION: No active disease.    Electronically Signed   By: Maryclare BeanArt  Hoss M.D.   On: 01/19/2014 00:28   Echocardiogram 12/08/2013 Study Conclusions - Left ventricle: HOCM with previous septal  myectomy The cavity size was mildly dilated. Wall thickness was increased in a pattern of moderate LVH. Systolic function was mildly to moderately reduced. The estimated ejection fraction was in the range of 40% to 45%. - Aortic valve: Trivial regurgitation. - Mitral valve: S/P repair with small diastolic gradient no MS Trivial MR - Left atrium: The atrium was moderately dilated. - Atrial septum: No defect or patent foramen ovale was identified.  Telemetry: A paced V sensed; no further VT Device interrogation: today - normal ICD function with good battery status and stable lead measurements (impedance, sensing and thresholds); dual tachycardia evident last night; sustained monomorphic VT (CL 318 msec) onset at 22:09 with ATP x 2 which failed to terminate rhythm but slowed VT to below detection rate / zone; no shock delivered   Assessment and Plan  Ms. Knauer has a complex cardiac history including HCM s/p myomectomy , paroxysmal VT s/p [facility] and PAF. She has failed medical therapy with multiple antiarrhythmic medications. She is currently taking mexiletine, ranolazine and metoprolol as per [facility] recommendations. She underwent EPS +RF ablation of atrial flutter after being admitted with inappropriate ICD shocks in Feb 2015. She presented yesterday with racing palpitations and near syncope.   1.  Ventricular tachycardia  - with ICD in place (implant[facility]omectomy) - failed VT [facility] - question need for tachy detection settings changes (ie-lower VT rate); however, with history of inappropriate shocks and evidence of PAT and PAF will have to follow carefully; will lower VT detection to 180 bpm - unable to tolerate amiodarone in the past 2. Paroxysmal atrial tachycardia and atrial fibrillation - recent atrial flutter ablation but also with known PAF and PAT - dual tachycardias on device interrogation - history of inappropriate shocks for atrial  tachycardia 3. HCM s/p myomectomy 4. NICM, EF 40-45% 5. Increased psychosocial stressors and little family support - doesn't feel threatened or in danger but states "no one understands" and her mother, siblings and daughter will not speak to her - will ask Social Work to assist with counseling and identifying community resources for help  Signed, [employer]is[employer] CorporationBrooke O Edmisten PA-C  I have seen, examined the patient, and reviewed the above assessment and plan.  Changes to above are made where necessary.  Device reprogrammed as per Dr Lubertha Basqueaylor's recommendation.  We will continue to monitor on telemetry without any medicine change at this time.  Co Sign: Hillis RangeJames Alie Hardgrove, MD 01/20/2014 2:47 PM

## 2014-01-20 NOTE — Progress Notes (Signed)
Patient has taken all scheduled medications from her home meds and not from nurse. She states that the hospital schedule does not correlate with her home time schedule, therefore she takes her own meds. Spoke with pharmacist to change medication time to ensure that staff can administer medications.

## 2014-01-21 NOTE — Discharge Summary (Signed)
CARDIOLOGY DISCHARGE SUMMARY   Patient ID: [name] MRN: [id number] DOB/AGE: 43/03/1971 43 y.o.  Admit date: 01/19/2014 Discharge date: 01/21/2014  PCP: Eustaquio BoydenJavier Gutierrez, MD Primary Cardiologist: Dr. Ladona Ridgelaylor  Primary Discharge Diagnosis:   Atrial Tach/VT (ventricular tachycardia)  Secondary Discharge Diagnosis:    Hypertrophic cardiomyopathy/modest left ventricular dysfunction-EF 45-50%   Atrial tachycardia  Procedures: Device interrogation  Hospital Course: [name] is a 43 y.o. female with a history of HOCM and a [employer].   She had palpitations and came to the ER. She was found to be in a narrow complex tachycardia, and her device was interrogated. She had VT treated by ATP which did not terminate it but slowed it. She was admitted for further evaluation and treatment.  There was concern for dehydration and her Lasix was held. She was kept on telemetry and monitored for additional events.  Dr. Ladona Ridgelaylor evaluated her and reviewed all data. She has significant psychosocial stressors in addition to the arrhythmias. Social Work was requested to speak with her and let her know of options for help.   Dr. Ladona Ridgelaylor did not find any good options for additional anti-arrhythmic therapy. She was found to have had atrial tach with 1:1 conduction which deteriorated to VT. Her device was reprogrammed for arrhythmia discrimination. She was held for another 24 hours.   On 04/24, she was seen and all data were reviewed. There were no further episodes of VT and she was otherwise doing well. No further inpatient workup was indicated and she is considered stable for discharge, to follow up as an outpatient.   Labs:  Lab Results  Component Value Date   WBC 8.0 01/19/2014   HGB 14.3 01/19/2014   HCT 43.8 01/19/2014   MCV 88.3 01/19/2014   PLT 239 01/19/2014     Recent Labs Lab 01/19/14 0038  NA 136*  K 4.2  CL 100  CO2 24  BUN 18  CREATININE 1.03  CALCIUM 9.5  GLUCOSE  112*   Radiology:  Dg Chest Port 1 View 01/19/2014   CLINICAL DATA:  Palpitation  EXAM: PORTABLE CHEST - 1 VIEW  COMPARISON:  DG CHEST 2 VIEW dated 01/06/2014; DG CHEST 2 VIEW dated 10/05/2012  FINDINGS: Left subclavian AICD device and leads are stable. Clear lungs. Low volumes. Normal heart size.  IMPRESSION: No active disease.   Electronically Signed   By: Maryclare BeanArt  Hoss M.D.   On: 01/19/2014 00:28   EKG: SR, LBBB (old)  FOLLOW UP PLANS AND APPOINTMENTS Allergies  Allergen Reactions  . Phenergan [Promethazine Hcl] Itching  . Warfarin And Related Other (See Comments)    'burns my skin'  . Amiodarone Nausea And Vomiting    Amiodarone tablets cause nausea Can tolerate IV Amiodarone  . Nitroglycerin Other (See Comments)    Hypotension     Medication List         acetaminophen 500 MG tablet  Commonly known as:  TYLENOL  Take 500-1,000 mg by mouth 2 (two) times daily as needed for mild pain, moderate pain, fever or headache (pain).     albuterol 108 (90 BASE) MCG/ACT inhaler  Commonly known as:  PROVENTIL HFA;VENTOLIN HFA  Inhale 1 puff into the lungs 3 (three) times daily as needed for wheezing or shortness of breath.     ALPRAZolam 1 MG tablet  Commonly known as:  XANAX  Take 1 mg by mouth 2 (two) times daily as needed for anxiety.     amoxicillin  500 MG capsule  Commonly known as:  AMOXIL  Take 4 capsules (2,000 mg total) by mouth once. 1 hour prior to dental procedure     aspirin EC 81 MG tablet  Take 81 mg by mouth daily.     beclomethasone 80 MCG/ACT inhaler  Commonly known as:  QVAR  Inhale 1 puff into the lungs 2 (two) times daily.     cetirizine 10 MG tablet  Commonly known as:  ZYRTEC  Take 10 mg by mouth daily.     furosemide 40 MG tablet  Commonly known as:  LASIX  Take 1 tablet (40 mg total) by mouth daily.     gabapentin 300 MG capsule  Commonly known as:  NEURONTIN  Take 1 capsule (300 mg total) by mouth 3 (three) times daily.     levothyroxine 50 MCG  tablet  Commonly known as:  SYNTHROID, LEVOTHROID  Take 50 mcg by mouth daily before breakfast.     magnesium gluconate 500 MG tablet  Commonly known as:  MAGONATE  Take 250 mg by mouth 2 (two) times daily.     metoprolol succinate 25 MG 24 hr tablet  Commonly known as:  TOPROL-XL  Take 1 tablet (25 mg total) by mouth daily.     mexiletine 150 MG capsule  Commonly known as:  MEXITIL  Take 2 capsules (300 mg total) by mouth 3 (three) times daily.     oxyCODONE-acetaminophen 5-325 MG per tablet  Commonly known as:  PERCOCET/ROXICET  Take 1 tablet by mouth 3 (three) times daily as needed for severe pain (pain).     polyethylene glycol powder powder  Commonly known as:  GLYCOLAX/MIRALAX  Take 17 g by mouth daily as needed (constipation).     Potassium Gluconate 595 MG Caps  Take 595 mg by mouth daily.     PROBIOTIC PO  Take 1 tablet by mouth daily.     ranolazine 1000 MG SR tablet  Commonly known as:  RANEXA  Take 1 tablet (1,000 mg total) by mouth 2 (two) times daily.     STOOL SOFTENER PO  Take 1 capsule by mouth 2 (two) times daily.        Discharge Orders   Future Appointments Provider Department Dept Phone   01/24/2014 8:30 AM Wilmon ArmsMatthew K. Corliss Skainssuei, MD [facility] [place] [phone]   02/03/2014 10:30 AM Marinus MawGregg W Taylor, MD [facility] [address] Office [phone]   03/17/2014 8:05 AM [facility] [address] Office [phone]   Future Orders Complete By Expires   Diet - low sodium heart healthy  As directed    Increase activity slowly  As directed      Follow-up Information   Follow up with Lewayne BuntingGregg Taylor, MD On 02/03/2014. (at 10:30 am.)    Specialty:  Cardiology   Contact information:   [address] [place] [address] [phone]       BRING ALL MEDICATIONS WITH YOU TO FOLLOW UP APPOINTMENTS  Time spent with patient to include physician time: 33 min Signed: Darrol JumpRhonda G Dekendrick Uzelac, PA-C 01/21/2014, 2:55  PM Co-Sign MD

## 2014-01-21 NOTE — Progress Notes (Signed)
Patient ID: [name], female   DOB: 01/19/1971, 43 y.o.   MRN: [id number]   Patient Name: [name] Date of Encounter: 01/21/2014     Active Problems:   Hypertrophic cardiomyopathy/modest left ventricular dysfunction-EF 45-50%   VT (ventricular tachycardia)   Atrial tachycardia    SUBJECTIVE Denies chest pain or sob. No palpitations.  CURRENT MEDS . aspirin EC  81 mg Oral Daily  . levothyroxine  50 mcg Oral QAC breakfast  . metoprolol  5 mg Intravenous Once  . metoprolol succinate  25 mg Oral Daily  . mexiletine  300 mg Oral TID  . ranolazine  1,000 mg Oral BID  . sodium chloride  3 mL Intravenous Q12H    OBJECTIVE  Filed Vitals:   01/20/14 0522 01/20/14 1405 01/20/14 2149 01/21/14 0501  BP: 110/61 101/61 109/64 103/65  Pulse: 75 73 75 75  Temp: 98 F (36.7 C) 98.1 F (36.7 C) 98.7 F (37.1 C) 98.1 F (36.7 C)  TempSrc: Oral Oral Oral Oral  Resp: 18 19 18 18   Height:      Weight: 212 lb 5.6 oz (96.322 kg)     SpO2: 99% 100% 99% 99%    Intake/Output Summary (Last 24 hours) at 01/21/14 1345 Last data filed at 01/21/14 1301  Gross per 24 hour  Intake    440 ml  Output      0 ml  Net    440 ml   Filed Weights   01/19/14 0340 01/20/14 0522  Weight: 212 lb 3.2 oz (96.253 kg) 212 lb 5.6 oz (96.322 kg)    PHYSICAL EXAM  General: Pleasant, NAD. Neuro: Alert and oriented X 3. Moves all extremities spontaneously. Psych: Normal affect. HEENT:  Normal  Neck: Supple without bruits or JVD. Lungs:  Resp regular and unlabored, CTA. Heart: RRR no s3, s4, or murmurs. Abdomen: Soft, obese, non-tender, non-distended, BS + x 4.  Extremities: No clubbing, cyanosis or edema. DP/PT/Radials 2+ and equal bilaterally.  Accessory Clinical Findings  CBC  Recent Labs  01/19/14 0037  WBC 8.0  NEUTROABS 5.6  HGB 14.3  HCT 43.8  MCV 88.3  PLT 239   Basic Metabolic Panel  Recent Labs  01/19/14 0038  NA 136*  K 4.2  CL 100  CO2 24  GLUCOSE 112*  BUN 18   CREATININE 1.03  CALCIUM 9.5  MG 2.0   Liver Function Tests No results found for this basename: AST, ALT, ALKPHOS, BILITOT, PROT, ALBUMIN,  in the last 72 hours No results found for this basename: LIPASE, AMYLASE,  in the last 72 hours Cardiac Enzymes No results found for this basename: CKTOTAL, CKMB, CKMBINDEX, TROPONINI,  in the last 72 hours BNP No components found with this basename: POCBNP,  D-Dimer No results found for this basename: DDIMER,  in the last 72 hours Hemoglobin A1C No results found for this basename: HGBA1C,  in the last 72 hours Fasting Lipid Panel No results found for this basename: CHOL, HDL, LDLCALC, TRIG, CHOLHDL, LDLDIRECT,  in the last 72 hours Thyroid Function Tests No results found for this basename: TSH, T4TOTAL, FREET3, T3FREE, THYROIDAB,  in the last 72 hours  TELE nsr with ventricular pacing   Radiology/Studies  Dg Chest 2 View  01/07/2014   CLINICAL DATA:  Chest pain  EXAM: CHEST  2 VIEW  COMPARISON:  11/27/2013  FINDINGS: Dual-chamber right approach ICD/ pacer. Unchanged lead orientation, including lead entering the azygos system. Unchanged cardiomegaly. Remote median sternotomy. No edema  or consolidation. No effusion or pneumothorax.  IMPRESSION: Stable exam.  No active cardiopulmonary disease.   Electronically Signed   By: Tiburcio PeaJonathan  Watts M.D.   On: 01/07/2014 00:06   Dg Chest Port 1 View  01/19/2014   CLINICAL DATA:  Palpitation  EXAM: PORTABLE CHEST - 1 VIEW  COMPARISON:  DG CHEST 2 VIEW dated 01/06/2014; DG CHEST 2 VIEW dated 10/05/2012  FINDINGS: Left subclavian AICD device and leads are stable. Clear lungs. Low volumes. Normal heart size.  IMPRESSION: No active disease.   Electronically Signed   By: Maryclare BeanArt  Hoss M.D.   On: 01/19/2014 00:28    ASSESSMENT AND PLAN 1. Recurrent VT 2. Recurrent SVT 3. Non-ischemic CM 4. Chronic systolic CHF 5. S/p ICD interogation and reprogramming Rec: ok for discharge. She will need followup with me in 2-3  weeks.  Gregg Taylor,M.D.  Gregg Taylor,M.D.  01/21/2014 1:45 PM

## 2014-01-24 ENCOUNTER — Ambulatory Visit (INDEPENDENT_AMBULATORY_CARE_PROVIDER_SITE_OTHER): Payer: BC Managed Care – PPO | Admitting: Surgery

## 2014-01-24 ENCOUNTER — Encounter (INDEPENDENT_AMBULATORY_CARE_PROVIDER_SITE_OTHER): Payer: Self-pay | Admitting: Surgery

## 2014-01-24 ENCOUNTER — Encounter (HOSPITAL_COMMUNITY): Payer: Self-pay | Admitting: Pharmacy Technician

## 2014-01-24 ENCOUNTER — Telehealth: Payer: Self-pay | Admitting: Internal Medicine

## 2014-01-24 VITALS — BP 110/60 | HR 75 | Temp 97.5°F | Resp 14 | Ht 60.0 in | Wt 209.4 lb

## 2014-01-24 DIAGNOSIS — K8010 Calculus of gallbladder with chronic cholecystitis without obstruction: Secondary | ICD-10-CM
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
## 2014-01-24 NOTE — Telephone Encounter (Signed)
Walk in pt Form " Attending Physicians Assessment Of Capacity form & Restrictions Form" Dropped  [name]. 4.27.15/kdm

## 2014-01-25 ENCOUNTER — Telehealth: Payer: Self-pay | Admitting: Internal Medicine

## 2014-01-25 ENCOUNTER — Encounter (HOSPITAL_COMMUNITY): Payer: Self-pay | Admitting: *Deleted

## 2014-01-25 MED ORDER — CHLORHEXIDINE GLUCONATE 4 % EX LIQD
1.0000 "application " | Freq: Once | CUTANEOUS | Status: DC
  Filled 2014-01-25: qty 15

## 2014-01-25 MED ORDER — CEFAZOLIN SODIUM-DEXTROSE 2-3 GM-% IV SOLR
2.0000 g | INTRAVENOUS | Status: AC
  Administered 2014-01-26: 2 g via INTRAVENOUS

## 2014-01-25 NOTE — Progress Notes (Signed)
Anesthesia Chart Review:  Patient is a 43 year old female scheduled for laparoscopic cholecystectomy tomorrow by Dr. Corliss Skainssuei. She is scheduled to be a same day work-up.  History includes former smoker, hypertrophic obstructive cardiomyopathy (HCOM), combined chronic CHF (class II as of 12/10/13), s/p [employer] AICD ([facility] following myomectomy '13), afib/flutter s/p ablation 11/16/13, v-tach s/p unsuccessful ablation '14 (currently on metoprolol, mexiletine and ranolazine), septal myomectomy and MV repair by [facility]. Gasper Lloydon Glower [facility] 01/27/2012, pericardial effusion with tamponade s/p pericardial window '13, post-operative DVT 01/2012, skin necrosis with Coumadin, hypothyroidism, anemia, ocular migraines, asthma, anxiety. Angioplasty is also listed on her history, but I haven't found any mention of this in most recent cardiology notes. Last BMI is recorded at 40.9 consistent with morbid obesity. PCP is Dr. Eustaquio BoydenJavier Gutierrez.  Cardiologist is Dr. Ladona Ridgelaylor.  He is aware of plans for surgery, stating in his note from 12/10/13 that his biggest concern in regard to surgery in her inability to take anti-arrhythmic medication after surgery, but felt she could receive IV amiodarone if she had recurrent VT perioperatively.   However, [facility]01[facility]14 with palpitations and presyncope and was found to be in a narrow complex tachycardia.  Device was interrogated and showed VT treated by ATP which did not terminate it but slowed it down. She was admitted for further evaluation.  Dr. Ladona Ridgelaylor did not find any good options for additional anti-arrhythmic therapy.  She has also been followed by Dr. Excell Seltzerooper in the past.  Echo on 12/08/13 showed:  - Left ventricle: HOCM with previous septal myectomy The cavity size was mildly dilated. Wall thickness was increased in a pattern of moderate LVH. Systolic function was mildly to moderately reduced. The estimated ejection fraction was in the range of  40% to 45%. - Aortic valve: Trivial regurgitation. - Mitral valve: S/P repair with small diastolic gradient, no MS. Trivial MR. - Left atrium: The atrium was moderately dilated. - Atrial septum: No defect or patent foramen ovale was identified.  [facility] under the Media tab, she underwent a RHC in 2014 that showed: RA 6 mmHg RV: 40/7 mmHg PA: 37/17 (27) mmHg PCWP: 17 mmHg Cardiac output: 5.1 L/min Cardiac index: 2.7 L/min-m2 PVR: 2.0 Woods unit.  EKG on 01/19/14 showed: SR, prolonged PR interval, left BBB, LAD. LAD is new when compared to EKG on 01/06/14.  Her last labs and CXR in Epic noted.   I reviewed history with anesthesiologist Dr. Jean RosenthalJackson, and also called and spoke with Dr. Ladona Ridgelaylor.  He feels she is on optimal medical therapy currently.  He did recommend a magnet be placed over her device during bovie use, but otherwise no additional preoperative recommendations.  She will be further evaluated by her assigned anesthesiologist on the day of surgery.  Velna Ochsllison Coltyn Hanning, PA-C [facility]/Anesthesiology Phone [phone] 01/25/2014 1:46 PM

## 2014-01-25 NOTE — Anesthesia Preprocedure Evaluation (Addendum)
Anesthesia Evaluation  Patient identified by MRN, date of birth, ID band  Reviewed: Allergy & Precautions, H&P , NPO status , Patient's Chart, lab work & pertinent test results, reviewed documented beta blocker date and time   Airway Mallampati: I      Dental  (+) Teeth Intact   Pulmonary asthma , former smoker,          Cardiovascular + Cardiac Defibrillator  HCOM   Neuro/Psych Anxiety Depression    GI/Hepatic   Endo/Other  Hypothyroidism   Renal/GU      Musculoskeletal  (+) Fibromyalgia -  Abdominal   Peds  Hematology  (+) anemia ,   Anesthesia Other Findings   Reproductive/Obstetrics                          Anesthesia Physical Anesthesia Plan  ASA: III  Anesthesia Plan: General   Post-op Pain Management:    Induction: Intravenous  Airway Management Planned: Oral ETT  Additional Equipment: Arterial line  Intra-op Plan:   Post-operative Plan: Extubation in OR  Informed Consent: I have reviewed the patients History and Physical, chart, labs and discussed the procedure including the risks, benefits and alternatives for the proposed anesthesia with the patient or authorized representative who has indicated his/her understanding and acceptance.   Dental advisory given  Plan Discussed with: CRNA and Anesthesiologist  Anesthesia Plan Comments: (01/25/14: I spoke with Dr. Lewayne BuntingGregg Taylor who recommended magnet over AICD during bovie use. See my complete anesthesia note.  Shonna ChockAllison Zelenak, PA-C)       Anesthesia Quick Evaluation

## 2014-01-25 NOTE — Telephone Encounter (Signed)
Pt aware [employer] paperwork ready For Pick up 4.28.15/kdm

## 2014-01-26 ENCOUNTER — Observation Stay (HOSPITAL_COMMUNITY)
Admission: RE | Admit: 2014-01-26 | Discharge: 2014-01-28 | Disposition: A | Discharge status: Home / Self Care | Payer: BC Managed Care – PPO | Source: Ambulatory Visit | Attending: Surgery | Admitting: Surgery

## 2014-01-26 ENCOUNTER — Encounter (HOSPITAL_COMMUNITY): Payer: Self-pay | Admitting: *Deleted

## 2014-01-26 ENCOUNTER — Encounter (HOSPITAL_COMMUNITY): Payer: BC Managed Care – PPO | Admitting: Vascular Surgery

## 2014-01-26 ENCOUNTER — Ambulatory Visit (HOSPITAL_COMMUNITY): Payer: BC Managed Care – PPO | Admitting: Vascular Surgery

## 2014-01-26 ENCOUNTER — Ambulatory Visit (HOSPITAL_COMMUNITY): Payer: BC Managed Care – PPO

## 2014-01-26 ENCOUNTER — Encounter (HOSPITAL_COMMUNITY)
Admission: RE | Disposition: A | Discharge status: Home / Self Care | Payer: Self-pay | Source: Ambulatory Visit | Attending: Surgery

## 2014-01-26 DIAGNOSIS — I509 Heart failure, unspecified: Secondary | ICD-10-CM | POA: Insufficient documentation

## 2014-01-26 DIAGNOSIS — K8010 Calculus of gallbladder with chronic cholecystitis without obstruction: Secondary | ICD-10-CM

## 2014-01-26 DIAGNOSIS — Z86718 Personal history of other venous thrombosis and embolism: Secondary | ICD-10-CM | POA: Insufficient documentation

## 2014-01-26 DIAGNOSIS — Z7982 Long term (current) use of aspirin: Secondary | ICD-10-CM | POA: Insufficient documentation

## 2014-01-26 DIAGNOSIS — E039 Hypothyroidism, unspecified: Secondary | ICD-10-CM | POA: Insufficient documentation

## 2014-01-26 DIAGNOSIS — F341 Dysthymic disorder: Secondary | ICD-10-CM | POA: Insufficient documentation

## 2014-01-26 DIAGNOSIS — J45909 Unspecified asthma, uncomplicated: Secondary | ICD-10-CM | POA: Insufficient documentation

## 2014-01-26 DIAGNOSIS — Z95810 Presence of automatic (implantable) cardiac defibrillator: Secondary | ICD-10-CM | POA: Insufficient documentation

## 2014-01-26 DIAGNOSIS — IMO0001 Reserved for inherently not codable concepts without codable children: Secondary | ICD-10-CM | POA: Insufficient documentation

## 2014-01-26 DIAGNOSIS — I421 Obstructive hypertrophic cardiomyopathy: Secondary | ICD-10-CM | POA: Insufficient documentation

## 2014-01-26 DIAGNOSIS — Z87891 Personal history of nicotine dependence: Secondary | ICD-10-CM | POA: Insufficient documentation

## 2014-01-26 DIAGNOSIS — I422 Other hypertrophic cardiomyopathy: Secondary | ICD-10-CM | POA: Insufficient documentation

## 2014-01-26 DIAGNOSIS — R011 Cardiac murmur, unspecified: Secondary | ICD-10-CM | POA: Insufficient documentation

## 2014-01-26 DIAGNOSIS — I5042 Chronic combined systolic (congestive) and diastolic (congestive) heart failure: Secondary | ICD-10-CM | POA: Insufficient documentation

## 2014-01-26 DIAGNOSIS — Z6841 Body Mass Index (BMI) 40.0 and over, adult: Secondary | ICD-10-CM | POA: Insufficient documentation

## 2014-01-26 DIAGNOSIS — K819 Cholecystitis, unspecified: Secondary | ICD-10-CM | POA: Diagnosis present

## 2014-01-26 DIAGNOSIS — I4891 Unspecified atrial fibrillation: Secondary | ICD-10-CM | POA: Insufficient documentation

## 2014-01-26 HISTORY — DX: Post-traumatic stress disorder, unspecified: F43.10

## 2014-01-26 HISTORY — DX: Depression, unspecified: F32.A

## 2014-01-26 HISTORY — DX: Major depressive disorder, single episode, unspecified: F32.9

## 2014-01-26 HISTORY — DX: Other specified postprocedural states: Z98.890

## 2014-01-26 HISTORY — DX: Other specified postprocedural states: R11.2

## 2014-01-26 HISTORY — DX: Fibromyalgia: M79.7

## 2014-01-26 HISTORY — PX: CHOLECYSTECTOMY: SHX55

## 2014-01-26 LAB — BASIC METABOLIC PANEL
BUN: 12 mg/dL (ref 6–23)
CO2: 25 meq/L (ref 19–32)
CREATININE: 0.79 mg/dL (ref 0.50–1.10)
Calcium: 9.2 mg/dL (ref 8.4–10.5)
Chloride: 100 mEq/L (ref 96–112)
GFR calc Af Amer: >90 mL/min (ref >90)
GFR calc non Af Amer: >90 mL/min (ref >90)
Glucose, Bld: 94 mg/dL (ref 70–99)
Potassium: 4.3 mEq/L (ref 3.7–5.3)
SODIUM: 138 meq/L (ref 137–147)

## 2014-01-26 LAB — CBC
HCT: 42.7 % (ref 36.0–46.0)
HEMOGLOBIN: 14.3 g/dL (ref 12.0–15.0)
MCH: 29.3 pg (ref 26.0–34.0)
MCHC: 33.5 g/dL (ref 30.0–36.0)
MCV: 87.5 fL (ref 78.0–100.0)
Platelets: 281 10*3/uL (ref 150–400)
RBC: 4.88 MIL/uL (ref 3.87–5.11)
RDW: 13.7 % (ref 11.5–15.5)
WBC: 4.4 10*3/uL (ref 4.0–10.5)

## 2014-01-26 LAB — HCG, SERUM, QUALITATIVE: Preg, Serum: NEGATIVE

## 2014-01-26 SURGERY — LAPAROSCOPIC CHOLECYSTECTOMY WITH INTRAOPERATIVE CHOLANGIOGRAM
Anesthesia: General | Site: Abdomen

## 2014-01-26 MED ORDER — SODIUM CHLORIDE 0.9 % IR SOLN
Status: DC | PRN
  Administered 2014-01-26: 1000 mL

## 2014-01-26 MED ORDER — LEVOTHYROXINE SODIUM 50 MCG PO TABS
50.0000 ug | ORAL_TABLET | Freq: Every day | ORAL | Status: DC
  Administered 2014-01-27 – 2014-01-28 (×2): 50 ug via ORAL
  Filled 2014-01-26 (×4): qty 1

## 2014-01-26 MED ORDER — ALBUTEROL SULFATE (2.5 MG/3ML) 0.083% IN NEBU: 2.5000 mg | INHALATION_SOLUTION | RESPIRATORY_TRACT | Status: DC | PRN

## 2014-01-26 MED ORDER — MIDAZOLAM HCL 5 MG/5ML IJ SOLN
INTRAMUSCULAR | Status: DC | PRN
  Administered 2014-01-26 (×2): 1 mg via INTRAVENOUS

## 2014-01-26 MED ORDER — FENTANYL CITRATE 0.05 MG/ML IJ SOLN
INTRAMUSCULAR | Status: DC | PRN
  Administered 2014-01-26 (×2): 50 ug via INTRAVENOUS

## 2014-01-26 MED ORDER — OXYCODONE-ACETAMINOPHEN 5-325 MG PO TABS
1.0000 | ORAL_TABLET | ORAL | Status: DC | PRN
  Administered 2014-01-27 (×4): 1 via ORAL
  Filled 2014-01-26: qty 2
  Filled 2014-01-26 (×4): qty 1

## 2014-01-26 MED ORDER — ACETAMINOPHEN 325 MG PO TABS: 650.0000 mg | ORAL_TABLET | ORAL | Status: DC | PRN

## 2014-01-26 MED ORDER — ENOXAPARIN SODIUM 40 MG/0.4ML ~~LOC~~ SOLN
40.0000 mg | SUBCUTANEOUS | Status: DC
  Administered 2014-01-27: 40 mg via SUBCUTANEOUS
  Filled 2014-01-26 (×4): qty 0.4

## 2014-01-26 MED ORDER — BUPIVACAINE-EPINEPHRINE (PF) 0.25% -1:200000 IJ SOLN
INTRAMUSCULAR | Status: AC
  Filled 2014-01-26: qty 30

## 2014-01-26 MED ORDER — RANOLAZINE ER 500 MG PO TB12: 1000.0000 mg | ORAL_TABLET | Freq: Two times a day (BID) | ORAL | Status: DC

## 2014-01-26 MED ORDER — GLYCOPYRROLATE 0.2 MG/ML IJ SOLN
INTRAMUSCULAR | Status: DC | PRN
  Administered 2014-01-26: .8 mg via INTRAVENOUS

## 2014-01-26 MED ORDER — ALPRAZOLAM 0.5 MG PO TABS
1.0000 mg | ORAL_TABLET | Freq: Two times a day (BID) | ORAL | Status: DC | PRN
  Administered 2014-01-27: 1 mg via ORAL
  Filled 2014-01-26: qty 2

## 2014-01-26 MED ORDER — ROCURONIUM BROMIDE 100 MG/10ML IV SOLN
INTRAVENOUS | Status: DC | PRN
  Administered 2014-01-26: 10 mg via INTRAVENOUS
  Administered 2014-01-26: 30 mg via INTRAVENOUS

## 2014-01-26 MED ORDER — FUROSEMIDE 40 MG PO TABS
40.0000 mg | ORAL_TABLET | Freq: Every day | ORAL | Status: DC
  Administered 2014-01-27: 40 mg via ORAL
  Filled 2014-01-26 (×2): qty 1

## 2014-01-26 MED ORDER — MIDAZOLAM HCL 2 MG/2ML IJ SOLN
INTRAMUSCULAR | Status: AC
  Filled 2014-01-26: qty 2

## 2014-01-26 MED ORDER — GLYCOPYRROLATE 0.2 MG/ML IJ SOLN
INTRAMUSCULAR | Status: AC
  Filled 2014-01-26: qty 4

## 2014-01-26 MED ORDER — LIDOCAINE HCL (CARDIAC) 20 MG/ML IV SOLN
INTRAVENOUS | Status: DC | PRN
  Administered 2014-01-26: 60 mg via INTRAVENOUS

## 2014-01-26 MED ORDER — NEOSTIGMINE METHYLSULFATE 1 MG/ML IJ SOLN
INTRAMUSCULAR | Status: DC | PRN
  Administered 2014-01-26: 5 mg via INTRAVENOUS

## 2014-01-26 MED ORDER — HYDROMORPHONE HCL PF 1 MG/ML IJ SOLN
INTRAMUSCULAR | Status: AC
  Filled 2014-01-26: qty 1

## 2014-01-26 MED ORDER — METOPROLOL SUCCINATE ER 25 MG PO TB24
25.0000 mg | ORAL_TABLET | Freq: Every day | ORAL | Status: DC
  Administered 2014-01-27 – 2014-01-28 (×2): 25 mg via ORAL
  Filled 2014-01-26 (×3): qty 1

## 2014-01-26 MED ORDER — HEMOSTATIC AGENTS (NO CHARGE) OPTIME
TOPICAL | Status: DC | PRN
  Administered 2014-01-26: 1

## 2014-01-26 MED ORDER — FENTANYL CITRATE 0.05 MG/ML IJ SOLN
INTRAMUSCULAR | Status: AC
  Filled 2014-01-26: qty 5

## 2014-01-26 MED ORDER — FLUTICASONE PROPIONATE HFA 44 MCG/ACT IN AERO
1.0000 | INHALATION_SPRAY | Freq: Two times a day (BID) | RESPIRATORY_TRACT | Status: DC
  Administered 2014-01-27 – 2014-01-28 (×3): 1 via RESPIRATORY_TRACT
  Filled 2014-01-26: qty 10.6

## 2014-01-26 MED ORDER — MEXILETINE HCL 150 MG PO CAPS
300.0000 mg | ORAL_CAPSULE | Freq: Three times a day (TID) | ORAL | Status: DC
  Administered 2014-01-26 – 2014-01-28 (×4): 300 mg via ORAL
  Filled 2014-01-26 (×7): qty 2

## 2014-01-26 MED ORDER — RANOLAZINE ER 500 MG PO TB12
1000.0000 mg | ORAL_TABLET | Freq: Two times a day (BID) | ORAL | Status: DC
  Administered 2014-01-26 – 2014-01-28 (×4): 1000 mg via ORAL
  Filled 2014-01-26 (×7): qty 2

## 2014-01-26 MED ORDER — ALBUTEROL SULFATE HFA 108 (90 BASE) MCG/ACT IN AERS: 1.0000 | INHALATION_SPRAY | RESPIRATORY_TRACT | Status: DC | PRN

## 2014-01-26 MED ORDER — ASPIRIN EC 81 MG PO TBEC
81.0000 mg | DELAYED_RELEASE_TABLET | Freq: Every day | ORAL | Status: DC
  Administered 2014-01-26 – 2014-01-28 (×3): 81 mg via ORAL
  Filled 2014-01-26 (×4): qty 1

## 2014-01-26 MED ORDER — BUPIVACAINE-EPINEPHRINE 0.25% -1:200000 IJ SOLN
INTRAMUSCULAR | Status: DC | PRN
Start: 2014-01-26 — End: 2014-01-26
  Administered 2014-01-26: 14 mL

## 2014-01-26 MED ORDER — MORPHINE SULFATE 2 MG/ML IJ SOLN: 2.0000 mg | INTRAMUSCULAR | Status: DC | PRN

## 2014-01-26 MED ORDER — ONDANSETRON HCL 4 MG/2ML IJ SOLN
INTRAMUSCULAR | Status: DC | PRN
  Administered 2014-01-26: 4 mg via INTRAVENOUS

## 2014-01-26 MED ORDER — LACTATED RINGERS IV SOLN
INTRAVENOUS | Status: DC
  Administered 2014-01-26 (×2): via INTRAVENOUS

## 2014-01-26 MED ORDER — HYDROMORPHONE HCL PF 1 MG/ML IJ SOLN
0.2500 mg | INTRAMUSCULAR | Status: DC | PRN
Start: 2014-01-26 — End: 2014-01-26
  Administered 2014-01-26 (×3): 0.5 mg via INTRAVENOUS

## 2014-01-26 MED ORDER — IOHEXOL 300 MG/ML  SOLN
INTRAMUSCULAR | Status: DC | PRN
Start: 2014-01-26 — End: 2014-01-26
  Administered 2014-01-26: 14:00:00

## 2014-01-26 MED ORDER — PROPOFOL 10 MG/ML IV BOLUS
INTRAVENOUS | Status: AC
  Filled 2014-01-26: qty 20

## 2014-01-26 MED ORDER — 0.9 % SODIUM CHLORIDE (POUR BTL) OPTIME
TOPICAL | Status: DC | PRN
  Administered 2014-01-26: 1000 mL

## 2014-01-26 MED ORDER — NEOSTIGMINE METHYLSULFATE 10 MG/10ML IV SOLN
INTRAVENOUS | Status: AC
  Filled 2014-01-26: qty 1

## 2014-01-26 MED ORDER — GABAPENTIN 300 MG PO CAPS
300.0000 mg | ORAL_CAPSULE | Freq: Three times a day (TID) | ORAL | Status: DC
  Administered 2014-01-26 – 2014-01-28 (×5): 300 mg via ORAL
  Filled 2014-01-26 (×8): qty 1

## 2014-01-26 MED ORDER — PROPOFOL 10 MG/ML IV BOLUS
INTRAVENOUS | Status: DC | PRN
  Administered 2014-01-26: 130 mg via INTRAVENOUS

## 2014-01-26 SURGICAL SUPPLY — 52 items
APL SKNCLS STERI-STRIP NONHPOA (GAUZE/BANDAGES/DRESSINGS) ×1
APPLIER CLIP ROT 10 11.4 M/L (STAPLE) ×3
BAG SPEC RTRVL LRG 6X4 10 (ENDOMECHANICALS) ×1
BENZOIN TINCTURE PRP APPL 2/3 (GAUZE/BANDAGES/DRESSINGS) ×3 IMPLANT
BLADE SURG ROTATE 9660 (MISCELLANEOUS) IMPLANT
CANISTER SUCTION 2500CC (MISCELLANEOUS) ×3 IMPLANT
CHLORAPREP W/TINT 26ML (MISCELLANEOUS) ×3 IMPLANT
CLIP APPLIE ROT 10 11.4 M/L (STAPLE) ×1 IMPLANT
COVER MAYO STAND STRL (DRAPES) ×3 IMPLANT
COVER SURGICAL LIGHT HANDLE (MISCELLANEOUS) ×3 IMPLANT
DRAPE C-ARM 42X72 X-RAY (DRAPES) ×3 IMPLANT
DRAPE SURG 17X23 STRL (DRAPES) ×3 IMPLANT
DRAPE UTILITY 15X26 W/TAPE STR (DRAPE) ×6 IMPLANT
DRSG TEGADERM 2-3/8X2-3/4 SM (GAUZE/BANDAGES/DRESSINGS) ×12 IMPLANT
DRSG TEGADERM 4X4.75 (GAUZE/BANDAGES/DRESSINGS) ×3 IMPLANT
ELECT REM PT RETURN 9FT ADLT (ELECTROSURGICAL) ×3
ELECTRODE REM PT RTRN 9FT ADLT (ELECTROSURGICAL) ×1 IMPLANT
FILTER SMOKE EVAC LAPAROSHD (FILTER) ×3 IMPLANT
GAUZE SPONGE 2X2 8PLY STRL LF (GAUZE/BANDAGES/DRESSINGS) ×1 IMPLANT
GLOVE BIO SURGEON STRL SZ7 (GLOVE) ×3 IMPLANT
GLOVE BIOGEL PI IND STRL 6.5 (GLOVE) ×1 IMPLANT
GLOVE BIOGEL PI IND STRL 7.0 (GLOVE) ×2 IMPLANT
GLOVE BIOGEL PI IND STRL 7.5 (GLOVE) ×1 IMPLANT
GLOVE BIOGEL PI INDICATOR 6.5 (GLOVE) ×2
GLOVE BIOGEL PI INDICATOR 7.0 (GLOVE) ×4
GLOVE BIOGEL PI INDICATOR 7.5 (GLOVE) ×2
GLOVE SS BIOGEL STRL SZ 7 (GLOVE) ×1 IMPLANT
GLOVE SUPERSENSE BIOGEL SZ 7 (GLOVE) ×2
GLOVE SURG SS PI 7.0 STRL IVOR (GLOVE) ×3 IMPLANT
GOWN STRL REUS W/ TWL LRG LVL3 (GOWN DISPOSABLE) ×3 IMPLANT
GOWN STRL REUS W/TWL LRG LVL3 (GOWN DISPOSABLE) ×9
HEMOSTAT SNOW SURGICEL 2X4 (HEMOSTASIS) ×3 IMPLANT
KIT BASIN OR (CUSTOM PROCEDURE TRAY) ×3 IMPLANT
KIT ROOM TURNOVER OR (KITS) ×3 IMPLANT
NS IRRIG 1000ML POUR BTL (IV SOLUTION) ×3 IMPLANT
PAD ARMBOARD 7.5X6 YLW CONV (MISCELLANEOUS) ×3 IMPLANT
PAD ONESTEP ZOLL R SERIES ADT (MISCELLANEOUS) ×3 IMPLANT
POUCH SPECIMEN RETRIEVAL 10MM (ENDOMECHANICALS) ×3 IMPLANT
SCALPEL HARMONIC ACE (MISCELLANEOUS) ×3 IMPLANT
SCISSORS LAP 5X35 DISP (ENDOMECHANICALS) ×3 IMPLANT
SET CHOLANGIOGRAPH 5 50 .035 (SET/KITS/TRAYS/PACK) ×3 IMPLANT
SET IRRIG TUBING LAPAROSCOPIC (IRRIGATION / IRRIGATOR) ×3 IMPLANT
SLEEVE ENDOPATH XCEL 5M (ENDOMECHANICALS) ×6 IMPLANT
SPECIMEN JAR SMALL (MISCELLANEOUS) ×3 IMPLANT
SPONGE GAUZE 2X2 STER 10/PKG (GAUZE/BANDAGES/DRESSINGS) ×2
SUT MNCRL AB 4-0 PS2 18 (SUTURE) ×3 IMPLANT
TOWEL OR 17X24 6PK STRL BLUE (TOWEL DISPOSABLE) ×3 IMPLANT
TOWEL OR 17X26 10 PK STRL BLUE (TOWEL DISPOSABLE) ×3 IMPLANT
TRAY LAPAROSCOPIC (CUSTOM PROCEDURE TRAY) ×3 IMPLANT
TROCAR XCEL BLUNT TIP 100MML (ENDOMECHANICALS) ×3 IMPLANT
TROCAR XCEL NON-BLD 11X100MML (ENDOMECHANICALS) ×3 IMPLANT
TROCAR XCEL NON-BLD 5MMX100MML (ENDOMECHANICALS) ×3 IMPLANT

## 2014-01-26 NOTE — Progress Notes (Signed)
Per Barbara CowerJason, [employer]. No need to interrogate ICD. No beep noted via auscultation of device.

## 2014-01-26 NOTE — Anesthesia Procedure Notes (Signed)
Procedure Name: Intubation Date/Time: 01/26/2014 1:55 PM Performed by: Sharlene DoryWALKER, Meira Wahba E Pre-anesthesia Checklist: Patient identified, Emergency Drugs available, Suction available, Patient being monitored and Timeout performed Patient Re-evaluated:Patient Re-evaluated prior to inductionOxygen Delivery Method: Circle system utilized Preoxygenation: Pre-oxygenation with 100% oxygen Intubation Type: IV induction Ventilation: Mask ventilation without difficulty Laryngoscope Size: Mac and 3 Grade View: Grade I Tube type: Oral Tube size: 7.5 mm Number of attempts: 1 Airway Equipment and Method: Stylet Placement Confirmation: ETT inserted through vocal cords under direct vision,  positive ETCO2 and breath sounds checked- equal and bilateral Secured at: 21 cm Tube secured with: Tape Dental Injury: Teeth and Oropharynx as per pre-operative assessment

## 2014-01-26 NOTE — Interval H&P Note (Signed)
History and Physical Interval Note:  01/26/2014 1:09 PM  [name]  has presented today for surgery, with the diagnosis of cholecystitis   The various methods of treatment have been discussed with the patient and family. After consideration of risks, benefits and other options for treatment, the patient has consented to  Procedure(s): LAPAROSCOPIC CHOLECYSTECTOMY WITH INTRAOPERATIVE CHOLANGIOGRAM (N/A) as a surgical intervention .  The patient's history has been reviewed, patient examined, no change in status, stable for surgery.  I have reviewed the patient's chart and labs.  Questions were answered to the patient's satisfaction.     Wilmon ArmsMatthew K. Prisilla Kocsis

## 2014-01-26 NOTE — Anesthesia Postprocedure Evaluation (Signed)
  Anesthesia Post-op Note  Patient: [name]  Procedure(s) Performed: Procedure(s): LAPAROSCOPIC CHOLECYSTECTOMY WITH INTRAOPERATIVE CHOLANGIOGRAM (N/A)  Patient Location: PACU  Anesthesia Type:General  Level of Consciousness: awake  Airway and Oxygen Therapy: Patient Spontanous Breathing  Post-op Pain: mild  Post-op Assessment: Post-op Vital signs reviewed  Post-op Vital Signs: Reviewed  Last Vitals:  Filed Vitals:   01/26/14 1519  BP:   Pulse:   Temp: 36.7 C  Resp:     Complications: No apparent anesthesia complications

## 2014-01-26 NOTE — Transfer of Care (Signed)
Immediate Anesthesia Transfer of Care Note  Patient: [name]  Procedure(s) Performed: Procedure(s): LAPAROSCOPIC CHOLECYSTECTOMY WITH INTRAOPERATIVE CHOLANGIOGRAM (N/A)  Patient Location: PACU  Anesthesia Type:General  Level of Consciousness: awake, alert  and oriented  Airway & Oxygen Therapy: Patient Spontanous Breathing  Post-op Assessment: Report given to PACU RN  Post vital signs: Reviewed and stable  Complications: No apparent anesthesia complications
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
## 2014-01-26 NOTE — Op Note (Signed)
Laparoscopic Cholecystectomy with IOC Procedure Note  Indications: This patient presents with symptomatic gallbladder disease and will undergo laparoscopic cholecystectomy.  She has significant medical comorbidities and has an AICD/ pacemaker.  The AICD is managed with a magnet and we will minimize the use of cautery.  Pre-operative Diagnosis: Calculus of gallbladder with other cholecystitis, without mention of obstruction  Post-operative Diagnosis: Same  Surgeon: Kamauri Kathol K. Carling Liberman   Assistants: none  Anesthesia: Wilmon ArmsGeneral endotracheal anesthesia  ASA Class: 3  Procedure Details  The patient was seen again in the Holding Room. The risks, benefits, complications, treatment options, and expected outcomes were discussed with the patient. The possibilities of reaction to medication, pulmonary aspiration, perforation of viscus, bleeding, recurrent infection, finding a normal gallbladder, the need for additional procedures, failure to diagnose a condition, the possible need to convert to an open procedure, and creating a complication requiring transfusion or operation were discussed with the patient. The likelihood of improving the patient's symptoms with return to their baseline status is good.  The patient and/or family concurred with the proposed plan, giving informed consent. The site of surgery properly noted. The patient was taken to Operating Room, identified as [name] and the procedure verified as Laparoscopic Cholecystectomy with Intraoperative Cholangiogram. A Time Out was held and the above information confirmed.  Prior to the induction of general anesthesia, antibiotic prophylaxis was administered. General endotracheal anesthesia was then administered and tolerated well. After the induction, the abdomen was prepped with Chloraprep and draped in the sterile fashion. The patient was positioned in the supine position.  Local anesthetic agent was injected into the skin near the umbilicus  and an incision made. We dissected down to the abdominal fascia with blunt dissection.  The fascia was incised vertically and we entered the peritoneal cavity bluntly.  A pursestring suture of 0-Vicryl was placed around the fascial opening.  The Hasson cannula was inserted and secured with the stay suture.  Pneumoperitoneum was then created with CO2 and tolerated well without any adverse changes in the patient's vital signs. An 11-mm port was placed in the subxiphoid position.  Two 5-mm ports were placed in the right upper quadrant. All skin incisions were infiltrated with a local anesthetic agent before making the incision and placing the trocars.   We positioned the patient in reverse Trendelenburg, tilted slightly to the patient's left.  The gallbladder was identified, the fundus grasped and retracted cephalad. Adhesions were lysed bluntly and with minimal electrocautery where indicated, taking care not to injure any adjacent organs or viscus. The infundibulum was grasped and retracted laterally, exposing the peritoneum overlying the triangle of Calot. This was then divided and exposed in a blunt fashion. A critical view of the cystic duct and cystic artery was obtained.  The cystic duct was clearly identified and bluntly dissected circumferentially. The cystic duct was ligated with a clip distally.   An incision was made in the cystic duct and the [facility] chol[facility] catheter introduced. The catheter was secured using a clip. A cholangiogram was then obtained which showed good visualization of the distal and proximal biliary tree with no sign of filling defects or obstruction.  Contrast flowed easily into the duodenum. The catheter was then removed.   The cystic duct was then ligated with clips and divided. The cystic artery was identified, dissected free, ligated with clips and divided as well.   The gallbladder was dissected from the liver bed in retrograde fashion with the harmonic scalpel. The  gallbladder was removed  and placed in an Endocatch sac. The liver bed was irrigated and inspected. Hemostasis was achieved with minimal use of electrocautery. Copious irrigation was utilized and was repeatedly aspirated until clear.  Surgicel SNOW was placed in the gallbladder fossa. The gallbladder and Endocatch sac were then removed through the umbilical port site.  The pursestring suture was used to close the umbilical fascia.    We again inspected the right upper quadrant for hemostasis.  Pneumoperitoneum was released as we removed the trocars.  4-0 Monocryl was used to close the skin.   Benzoin, steri-strips, and clean dressings were applied. The patient was then extubated and brought to the recovery room in stable condition. Instrument, sponge, and needle counts were correct at closure and at the conclusion of the case.   Findings: Cholecystitis with Cholelithiasis  Estimated Blood Loss: Minimal         Drains: none         Specimens: Gallbladder           Complications: None; patient tolerated the procedure well.         Disposition: PACU - hemodynamically stable.         Condition: stable  Wilmon ArmsMatthew K. Corliss Skainssuei, MD, [facility]  General/ Trauma Surgery  01/26/2014 3:05 PM

## 2014-01-27 ENCOUNTER — Encounter (HOSPITAL_COMMUNITY): Payer: Self-pay | Admitting: Surgery

## 2014-01-27 DIAGNOSIS — K819 Cholecystitis, unspecified: Secondary | ICD-10-CM | POA: Diagnosis present

## 2014-01-27 LAB — COMPREHENSIVE METABOLIC PANEL
ALBUMIN: 3.3 g/dL — AB (ref 3.5–5.2)
ALT: 36 U/L — ABNORMAL HIGH (ref 0–35)
AST: 37 U/L (ref 0–37)
Alkaline Phosphatase: 59 U/L (ref 39–117)
BILIRUBIN TOTAL: 0.5 mg/dL (ref 0.3–1.2)
BUN: 12 mg/dL (ref 6–23)
CHLORIDE: 100 meq/L (ref 96–112)
CO2: 29 meq/L (ref 19–32)
Calcium: 8.9 mg/dL (ref 8.4–10.5)
Creatinine, Ser: 0.87 mg/dL (ref 0.50–1.10)
GFR calc Af Amer: >90 mL/min (ref >90)
GFR calc non Af Amer: 80 mL/min — ABNORMAL LOW (ref >90)
Glucose, Bld: 94 mg/dL (ref 70–99)
Potassium: 4.4 mEq/L (ref 3.7–5.3)
Sodium: 138 mEq/L (ref 137–147)
Total Protein: 6.8 g/dL (ref 6.0–8.3)

## 2014-01-27 LAB — CBC
HCT: 40.6 % (ref 36.0–46.0)
Hemoglobin: 12.8 g/dL (ref 12.0–15.0)
MCH: 28.4 pg (ref 26.0–34.0)
MCHC: 31.5 g/dL (ref 30.0–36.0)
MCV: 90.2 fL (ref 78.0–100.0)
Platelets: 247 10*3/uL (ref 150–400)
RBC: 4.5 MIL/uL (ref 3.87–5.11)
RDW: 13.8 % (ref 11.5–15.5)
WBC: 5.3 10*3/uL (ref 4.0–10.5)

## 2014-01-27 MED ORDER — TAMSULOSIN HCL 0.4 MG PO CAPS
0.4000 mg | ORAL_CAPSULE | Freq: Every day | ORAL | Status: DC
  Administered 2014-01-27: 0.4 mg via ORAL
  Filled 2014-01-27 (×2): qty 1

## 2014-01-27 NOTE — Progress Notes (Signed)
1 Day Post-Op  Subjective: Sore on right side Unable to void last night - required I&O cath Tolerating clears without difficulty  Objective: Vital signs in last 24 hours: Temp:  [97.7 F (36.5 C)-98.5 F (36.9 C)] 98.3 F (36.8 C) (04/30 0555) Pulse Rate:  [74-81] 75 (04/30 0555) Resp:  [11-20] 18 (04/30 0555) BP: (93-119)/(48-63) 101/59 mmHg (04/30 0555) SpO2:  [85 %-100 %] 99 % (04/30 0555) Weight:  [210 lb (95.255 kg)] 210 lb (95.255 kg) (04/29 2100) Last BM Date: 01/25/14  Intake/Output from previous day: 04/29 0701 - 04/30 0700 In: 2476.7 [P.O.:680; I.V.:1796.7] Out: 500 [Urine:450; Blood:50] Intake/Output this shift:    General appearance: alert, cooperative and no distress Resp: clear to auscultation bilaterally Cardio: paced; regular rhythm GI: mildly distended; tender around incisions on right Dressing dry  Lab Results:   Recent Labs  01/26/14 1228 01/27/14 0600  WBC 4.4 5.3  HGB 14.3 12.8  HCT 42.7 40.6  PLT 281 247   BMET  Recent Labs  01/26/14 1228  NA 138  K 4.3  CL 100  CO2 25  GLUCOSE 94  BUN 12  CREATININE 0.79  CALCIUM 9.2   PT/INR No results found for this basename: LABPROT, INR,  in the last 72 hours ABG No results found for this basename: PHART, PCO2, PO2, HCO3,  in the last 72 hours  Studies/Results: Dg Cholangiogram Operative  01/26/2014   CLINICAL DATA:  Cholelithiasis  EXAM: INTRAOPERATIVE CHOLANGIOGRAM  TECHNIQUE: Cholangiographic images from the C-arm fluoroscopic device were submitted for interpretation post-operatively. Please see the procedural report for the amount of contrast and the fluoroscopy time utilized.  COMPARISON:  None.  FINDINGS: Gallbladder is been removed, and the cystic duct has been cannulated. There is incomplete filling of the intrahepatic biliary ductal system. The visualized intrahepatic ducts appear normal. Common hepatic and common bile ducts appear normal. There is no mass or calculus in the  visualized biliary ductal system. There is apparent free flow of contrast via the common bile duct into the duodenum.  IMPRESSION: Incomplete visualization of the intrahepatic biliary ductal system. The extrahepatic biliary ductal system is visualized in its entirety. No mass or calculus is identified in the visualized biliary ductal system. Contrast flows into the duodenum via the common bile duct.   Electronically Signed   By: Bretta BangWilliam  Woodruff M.D.   On: 01/26/2014 15:41    Anti-infectives: Anti-infectives   Start     Dose/Rate Route Frequency Ordered Stop   01/26/14 0600  ceFAZolin (ANCEF) IVPB 2 g/50 mL premix     2 g 100 mL/hr over 30 Minutes Intravenous On call to O.R. 01/25/14 1359 01/26/14 1357      Assessment/Plan: s/p Procedure(s): LAPAROSCOPIC CHOLECYSTECTOMY WITH INTRAOPERATIVE CHOLANGIOGRAM (N/A) Plan for discharge tomorrow Ambulate Discontinue oxygen Advance diet Oral pain medications   LOS: 1 day    Wilmon ArmsMatthew K. Moana Munford 01/27/2014

## 2014-01-28 ENCOUNTER — Telehealth (INDEPENDENT_AMBULATORY_CARE_PROVIDER_SITE_OTHER): Payer: Self-pay

## 2014-01-28 MED ORDER — OXYCODONE-ACETAMINOPHEN 5-325 MG PO TABS: 1.0000 | ORAL_TABLET | ORAL | Status: DC | PRN

## 2014-01-28 NOTE — Discharge Summary (Signed)
Physician Discharge Summary  Patient ID: [name] MRN: [id number] DOB/AGE: 43/03/1971 43 y.o.  Admit date: 01/26/2014 Discharge date: 01/28/2014  Admission Diagnoses:  Chronic calculus cholecystitis    Hypertrophic cardiomyopathy    Pacemaker/ AICD  Discharge Diagnoses: Same Active Problems:   Chronic cholecystitis with calculus   Cholecystitis   Discharged Condition: good  Hospital Course: Laparoscopic cholecystectomy with IOC on 01/26/14 with deactivated AICD.  Minimal cautery used to avoid interference with pacemaker.  Patient stayed in hospital for close cardiac monitoring.  Significant abdominal pain on POD#1, but improved through the course of the day.  She tolerated a diet and her pain is being controlled with Percocet.  Consults: None  Significant Diagnostic Studies: labs:  Lab Results  Component Value Date   WBC 5.3 01/27/2014   HGB 12.8 01/27/2014   HCT 40.6 01/27/2014   MCV 90.2 01/27/2014   PLT 247 01/27/2014   Lab Results  Component Value Date   ALT 36* 01/27/2014   AST 37 01/27/2014   ALKPHOS 59 01/27/2014   BILITOT 0.5 01/27/2014   Lab Results  Component Value Date   CREATININE 0.87 01/27/2014   BUN 12 01/27/2014   NA 138 01/27/2014   K 4.4 01/27/2014   CL 100 01/27/2014   CO2 29 01/27/2014     Treatments: surgery: Lap chole with IOC  Discharge Exam: Blood pressure 105/53, pulse 75, temperature 98.6 F (37 C), temperature source Oral, resp. rate 18, height 5' (1.524 m), weight 210 lb (95.255 kg), last menstrual period 01/22/2014, SpO2 98.00%. General appearance: alert, cooperative and no distress GI: soft, mildly tender around umbilicus Incisions c/d/i  Disposition: 01-Home or Self Care  Discharge Orders   Future Appointments Provider Department Dept Phone   02/03/2014 10:30 AM Marinus MawGregg W Taylor, MD [facility] [address] [facility] [phone]   02/11/2014 10:50 AM Wilmon ArmsMatthew K. Daveion Robar, MD [facility] [place] [phone]   03/17/2014 8:05 AM [facility] [address] Office [phone]   Future Orders Complete By Expires   Call MD for:  persistant nausea and vomiting  As directed    Call MD for:  redness, tenderness, or signs of infection (pain, swelling, redness, odor or green/yellow discharge around incision site)  As directed    Call MD for:  severe uncontrolled pain  As directed    Call MD for:  temperature >100.4  As directed    Diet general  As directed    Driving Restrictions  As directed    Increase activity slowly  As directed    May shower / Bathe  As directed    May walk up steps  As directed        Medication List         acetaminophen 500 MG tablet  Commonly known as:  TYLENOL  Take 500-1,000 mg by mouth 2 (two) times daily as needed for mild pain, moderate pain, fever or headache (pain).     albuterol 108 (90 BASE) MCG/ACT inhaler  Commonly known as:  PROVENTIL HFA;VENTOLIN HFA  Inhale 1 puff into the lungs every 4 (four) hours as needed for wheezing or shortness of breath.     ALPRAZolam 1 MG tablet  Commonly known as:  XANAX  Take 1 mg by mouth 2 (two) times daily as needed for anxiety.     aspirin EC 81 MG tablet  Take 81 mg by mouth daily.     beclomethasone 80 MCG/ACT inhaler  Commonly known as:  QVAR  Inhale 1 puff into the lungs 2 (two) times daily.     cetirizine 10 MG tablet  Commonly known as:  ZYRTEC  Take 10 mg by mouth daily.     docusate sodium 100 MG capsule  Commonly known as:  COLACE  Take 100 mg by mouth 2 (two) times daily as needed for mild constipation.     furosemide 40 MG tablet  Commonly known as:  LASIX  Take 1 tablet (40 mg total) by mouth daily.     gabapentin 300 MG capsule  Commonly known as:  NEURONTIN  Take 1 capsule (300 mg total) by mouth 3 (three) times daily.     levothyroxine 50 MCG tablet  Commonly known as:  SYNTHROID, LEVOTHROID  Take 50 mcg by mouth daily before breakfast.     magnesium gluconate 500 MG tablet  Commonly known as:   MAGONATE  Take 250 mg by mouth 2 (two) times daily.     metoprolol succinate 25 MG 24 hr tablet  Commonly known as:  TOPROL-XL  Take 1 tablet (25 mg total) by mouth daily.     mexiletine 150 MG capsule  Commonly known as:  MEXITIL  Take 2 capsules (300 mg total) by mouth 3 (three) times daily.     ondansetron 4 MG tablet  Commonly known as:  ZOFRAN  Take 4 mg by mouth daily as needed for nausea or vomiting.     oxyCODONE-acetaminophen 5-325 MG per tablet  Commonly known as:  PERCOCET/ROXICET  Take 1 tablet by mouth 3 (three) times daily as needed for severe pain (pain).     oxyCODONE-acetaminophen 5-325 MG per tablet  Commonly known as:  PERCOCET/ROXICET  Take 1-2 tablets by mouth every 4 (four) hours as needed for moderate pain.     polyethylene glycol powder powder  Commonly known as:  GLYCOLAX/MIRALAX  Take 17 g by mouth daily as needed (constipation).     Potassium Gluconate 595 MG Caps  Take 595 mg by mouth daily.     PROBIOTIC PO  Take 1 tablet by mouth daily.     ranolazine 1000 MG SR tablet  Commonly known as:  RANEXA  Take 1 tablet (1,000 mg total) by mouth 2 (two) times daily.           Follow-up Information   Follow up with Teoman Giraud K., MD. Schedule an appointment as soon as possible for a visit in 3 weeks.   Specialty:  General Surgery   Contact information:   [address] [place] [address] [phone]       Signed: Wilmon ArmsMatthew K. Marg Macmaster 01/28/2014, 8:12 AM

## 2014-01-28 NOTE — Telephone Encounter (Signed)
Message copied by Maryan PulsMOORE, Kaidin Boehle on Fri Jan 28, 2014 12:23 PM ------      Message from: Wynona LunaSUEI, MATTHEW K      Created: Fri Jan 28, 2014 11:50 AM      Regarding: RE: prescription  ([facility] [phone])       If she had that many Percocet on 4/13, she shouldn't need the new prescription yet.  She can wait to fill today's prescription when the other rx is empty.            Thanks            ----- Message -----         From: Maryan Pulshristy Jozlin Bently, CMA         Sent: 01/28/2014  10:53 AM           To: Wilmon ArmsMatthew K. Corliss Skainssuei, MD      Subject: prescription  ([facility] [phone])                             [facility] for pain medication from  Laparoscopic Cholecystectomy on 01/26/14.  They report the patient received #120 Percocet on 01/10/14 and wanted you to be aware and see if you wanted to proceed with them filling the new prescription for pain medication.  Please advise and I will call the pharmacy back.            Jazalynn Mireles       ------

## 2014-01-28 NOTE — Telephone Encounter (Signed)
Called pharmacy to make aware that patient will need to complete prescription for pain medication that she received on 01/10/14 prior to filling her new prescription that was given to patient on 01/26/14 by Dr. Corliss Skainssuei.

## 2014-01-28 NOTE — Discharge Instructions (Signed)
[facility] °LAPAROSCOPIC SURGERY: POST OP INSTRUCTIONS °Always review your discharge instruction sheet given to you by the facility where your surgery was performed. °IF YOU HAVE DISABILITY OR FAMILY LEAVE FORMS, YOU MUST BRING THEM TO THE OFFICE FOR PROCESSING.   °DO NOT GIVE THEM TO YOUR DOCTOR. ° °1. A prescription for pain medication will be given to you upon discharge.  Take your pain medication as prescribed, if needed.  If narcotic pain medicine is not needed, then you may take acetaminophen (Tylenol) or ibuprofen (Advil) as needed. °2. Take your usually prescribed medications unless otherwise directed. °3. If you need a refill on your pain medication, please contact your pharmacy.  They will contact our office to request authorization. Prescriptions will not be filled after 5pm or on week-ends. °4. You should follow a light diet the first few days after arrival home, such as soup and crackers, etc.  Be sure to include lots of fluids daily. °5. Most patients will experience some swelling and bruising in the area of the incisions.  Ice packs will help.  Swelling and bruising can take several days to resolve.  °6. It is common to experience some constipation if taking pain medication after surgery.  Increasing fluid intake and taking a stool softener (such as Colace) will usually help or prevent this problem from occurring.  A mild laxative (Milk of Magnesia or Miralax) should be taken according to package instructions if there are no bowel movements after 48 hours. °7. Unless discharge instructions indicate otherwise, you may remove your bandages 48 hours after surgery, and you may shower at that time.  You will have steri-strips (small skin tapes) in place directly over the incision.  These strips should be left on the skin for 7-10 days.  If your surgeon used skin glue on the incision, you may shower in 24 hours.  The glue will flake off over the next 2-3 weeks.  Any sutures or staples  will be removed at the office during your follow-up visit. °8. ACTIVITIES:  You may resume regular (light) daily activities beginning the next day--such as daily self-care, walking, climbing stairs--gradually increasing activities as tolerated.  You may have sexual intercourse when it is comfortable.  Refrain from any heavy lifting or straining until approved by your doctor. °a. You may drive when you are no longer taking prescription pain medication, you can comfortably wear a seatbelt, and you can safely maneuver your car and apply brakes. °b. RETURN TO WORK:   2-3 weeks °9. You should see your doctor in the office for a follow-up appointment approximately 2-3 weeks after your surgery.  Make sure that you call for this appointment within a day or two after you arrive home to insure a convenient appointment time. °10. OTHER INSTRUCTIONS: ________________________________________________________________________ °WHEN TO CALL YOUR DOCTOR: °1. Fever over 101.0 °2. Inability to urinate °3. Continued bleeding from incision. °4. Increased pain, redness, or drainage from the incision. °5. Increasing abdominal pain ° °The clinic staff is available to answer your questions during regular business hours.  Please don’t hesitate to call and ask to speak to one of the nurses for clinical concerns.  If you have a medical emergency, go to the nearest emergency room or call 911.  A surgeon from [facility] is always on call at the hospital. °[address], [address], [place], [place]  [address] ? [address] [address], [place], [place]   [address] °[phone] ? [phone] ? FAX [phone] °Web site:   [email or web address] ° °

## 2014-01-29 ENCOUNTER — Encounter: Payer: Self-pay | Admitting: Family Medicine

## 2014-02-01 ENCOUNTER — Telehealth: Payer: Self-pay | Admitting: Neurology

## 2014-02-01 NOTE — Telephone Encounter (Signed)
Pt called wanting to f/u on the test that Dr. Everlena CooperJaffe wanted her to have. She can not remember the name of it.

## 2014-02-01 NOTE — Telephone Encounter (Signed)
Patient has appt on 02/28/14 for follow up she is having tingling numbness leg hands , head is wanting to see about EMG

## 2014-02-02 ENCOUNTER — Telehealth: Payer: Self-pay | Admitting: Internal Medicine

## 2014-02-02 NOTE — Telephone Encounter (Signed)
Returned Pt Call Made pt aware [employer] paperwork ready for P/U, she will pick up at 5.7 appt

## 2014-02-03 ENCOUNTER — Telehealth: Payer: Self-pay | Admitting: Internal Medicine

## 2014-02-03 ENCOUNTER — Encounter: Payer: Self-pay | Admitting: Internal Medicine

## 2014-02-03 ENCOUNTER — Ambulatory Visit (INDEPENDENT_AMBULATORY_CARE_PROVIDER_SITE_OTHER): Payer: BC Managed Care – PPO | Admitting: Internal Medicine

## 2014-02-03 VITALS — BP 114/78 | HR 75 | Ht 60.0 in | Wt 209.0 lb

## 2014-02-03 DIAGNOSIS — I472 Ventricular tachycardia, unspecified: Secondary | ICD-10-CM

## 2014-02-03 DIAGNOSIS — I4901 Ventricular fibrillation: Secondary | ICD-10-CM

## 2014-02-03 DIAGNOSIS — I4729 Other ventricular tachycardia: Secondary | ICD-10-CM

## 2014-02-03 DIAGNOSIS — I498 Other specified cardiac arrhythmias: Secondary | ICD-10-CM

## 2014-02-03 DIAGNOSIS — I471 Supraventricular tachycardia: Secondary | ICD-10-CM

## 2014-02-03 DIAGNOSIS — Z95810 Presence of automatic (implantable) cardiac defibrillator: Secondary | ICD-10-CM

## 2014-02-03 LAB — MDC_IDC_ENUM_SESS_TYPE_INCLINIC
Brady Statistic RA Percent Paced: 89 %
Brady Statistic RV Percent Paced: 1 %
Date Time Interrogation Session: 20150507040000
HighPow Impedance: 40 Ohm
HighPow Impedance: 53 Ohm
Implantable Pulse Generator Serial Number: 102591
Lead Channel Impedance Value: 522 Ohm
Lead Channel Impedance Value: 605 Ohm
Lead Channel Pacing Threshold Amplitude: 1 V
Lead Channel Pacing Threshold Pulse Width: 0.5 ms
Lead Channel Pacing Threshold Pulse Width: 0.5 ms
Lead Channel Sensing Intrinsic Amplitude: 6 mV
Lead Channel Setting Pacing Pulse Width: 0.5 ms
Lead Channel Setting Sensing Sensitivity: 0.6 mV
MDC IDC MSMT LEADCHNL RA PACING THRESHOLD AMPLITUDE: 0.7 V
MDC IDC MSMT LEADCHNL RV SENSING INTR AMPL: 16.8 mV
MDC IDC SET LEADCHNL RA PACING AMPLITUDE: 2 V
MDC IDC SET LEADCHNL RV PACING AMPLITUDE: 2.4 V
Zone Setting Detection Interval: 273 ms
Zone Setting Detection Interval: 333 ms

## 2014-02-03 NOTE — Telephone Encounter (Signed)
[employer] paperwork picked up by Pt  5.7.15/kdm

## 2014-02-03 NOTE — Patient Instructions (Signed)
Your physician recommends that you continue on your current medications as directed. Please refer to the Current Medication list given to you today.  Your physician recommends that you schedule a follow-up appointment in: 3 months with Dr.Taylor  

## 2014-02-07 ENCOUNTER — Encounter: Payer: Self-pay | Admitting: Internal Medicine

## 2014-02-07 NOTE — Assessment & Plan Note (Signed)
She has continued to have VT. Hopefully this will settle down. She has very little option for medical therapy and has already [facility]. 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 We discussed repeat ablation if her VT returns. This would be an option, albeit with some risk.

## 2014-02-07 NOTE — Assessment & Plan Note (Signed)
Her [employer] Sci device is working normally. Will recheck in several months. 

## 2014-02-07 NOTE — Progress Notes (Signed)
HPI [name] returns today for followup. She is a very pleasant 43 yo woman with HCM, s/p myomectomy, VT/VF who has been on multiple medications and had multiple ICD shocks. She came in several months ago with a 1:1 tachycardia and was found to have atrial flutter with an RVR. She was successfully ablated. She has had one recurrent shock thought due to a double tachycardia. She has undergon gall bladder surgery. Her heart failure is class 2.  She is appropriately anxious. Allergies  Allergen Reactions  . Phenergan [Promethazine Hcl] Itching  . Warfarin And Related Other (See Comments)    'burns my skin'  . Amiodarone Nausea And Vomiting    Amiodarone tablets cause nausea Can tolerate IV Amiodarone  . Nitroglycerin Other (See Comments)    Hypotension     Current Outpatient Prescriptions  Medication Sig Dispense Refill  . acetaminophen (TYLENOL) 500 MG tablet Take 500-1,000 mg by mouth 2 (two) times daily as needed for mild pain, moderate pain, fever or headache (pain).       Marland Kitchen albuterol (PROVENTIL HFA;VENTOLIN HFA) 108 (90 BASE) MCG/ACT inhaler Inhale 1 puff into the lungs every 4 (four) hours as needed for wheezing or shortness of breath.       . ALPRAZolam (XANAX) 1 MG tablet Take 1 mg by mouth 2 (two) times daily as needed for anxiety.      Marland Kitchen aspirin EC 81 MG tablet Take 81 mg by mouth daily.      . beclomethasone (QVAR) 80 MCG/ACT inhaler Inhale 1 puff into the lungs 2 (two) times daily.      . cetirizine (ZYRTEC) 10 MG tablet Take 10 mg by mouth daily.       Marland Kitchen docusate sodium (COLACE) 100 MG capsule Take 100 mg by mouth 2 (two) times daily as needed for mild constipation.      . furosemide (LASIX) 40 MG tablet Take 1 tablet (40 mg total) by mouth daily.  30 tablet  5  . gabapentin (NEURONTIN) 300 MG capsule Take 1 capsule (300 mg total) by mouth 3 (three) times daily.  90 capsule  6  . levothyroxine (SYNTHROID, LEVOTHROID) 50 MCG tablet Take 50 mcg by mouth daily before breakfast.        . magnesium gluconate (MAGONATE) 500 MG tablet Take 250 mg by mouth 2 (two) times daily.      . metoprolol succinate (TOPROL-XL) 25 MG 24 hr tablet Take 1 tablet (25 mg total) by mouth daily.  30 tablet  6  . mexiletine (MEXITIL) 150 MG capsule Take 2 capsules (300 mg total) by mouth 3 (three) times daily.  180 capsule  6  . ondansetron (ZOFRAN) 4 MG tablet Take 4 mg by mouth daily as needed for nausea or vomiting.      Marland Kitchen oxyCODONE-acetaminophen (PERCOCET/ROXICET) 5-325 MG per tablet Take 1-2 tablets by mouth every 4 (four) hours as needed for moderate pain.  40 tablet  0  . polyethylene glycol powder (GLYCOLAX/MIRALAX) powder Take 17 g by mouth daily as needed (constipation).       . Potassium Gluconate 595 MG CAPS Take 595 mg by mouth daily.      . Probiotic Product (PROBIOTIC PO) Take 1 tablet by mouth daily.       . ranolazine (RANEXA) 1000 MG SR tablet Take 1 tablet (1,000 mg total) by mouth 2 (two) times daily.  180 tablet  3   No current facility-administered medications for this visit.     Past Medical  History  Diagnosis Date  . Hypertrophic cardiomyopathy     s/p septal myomectomy with implantable defibrillator 01/27/2012   . Anxiety state, unspecified   . Allergic rhinitis     to dogs  . Asthma   . Anemia     during pregnancy  . History of chicken pox   . Generalized headaches   . Ocular migraine     no HA  . Back pain     told cracked bone, vicodin didn't help, improved with percocets/muscle relaxants, no eval by prior PCP  . 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 DVT (deep venous thrombosis) 01/2012    was placed on Warfarin after cardiac surgery but had skin reaction to warfarin so was changed to Pradaxa so DVT occurred while on Pradaxa although unclear if it occurred during transition  . Hypotension   . Atrial fibrillation     a. On pradaxa.  coumadin necrosis with warfarin;  b. Amio d/c'd 02/2012  . Pericardial  effusion   . Chronic combined systolic and diastolic CHF (congestive heart failure)     a. echo 03/19/12: severe asymmetric septal hypertrophy, evidence of upper septal myomectomy, peak LV outflow tract gradient 35, EF 35%,;  b.  Echo 9/13: Severe LVH, EF 30-35%, anteroseptal and inferior HK, LVOT narrow, mild AI, mild to moderate mitral stenosis, severe LAE  . Pericardial tamponade 03/14/2012  . 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 01/2012 s/p BSX ICD;  b. 06/2012 Multaq initiated due to recurrent syncope, VT/VF - had severe n/v with IV amio.  . Skin necrosis --COUMADIN   . Chronic chest wall pain     eval by [facility], stable, [facility]  . Syncope     a. in setting of VT/VF.  Marland Kitchen. Hypothyroid   . Blood transfusion without reported diagnosis   . Neuromuscular disorder   . Gall stones   . Atrial flutter 10/2013    with inappropriate ICD therapy; s/p CTI ablation by Dr Ladona Ridgelaylor 11/16/2013  . Automatic implantable cardioverter-defibrillator in situ   . Dysrhythmia   . Pacemaker   . Complication of anesthesia   . PONV (postoperative nausea and vomiting)   . Depression   . Post-traumatic stress   . Fibromyalgia     ROS:   All systems reviewed and negative except as noted in the HPI.   Past Surgical History  Procedure Laterality Date  . Cesarean section  1991  . Induced abortion  1990 and 1993  . Cardiac surgery  01/27/2012    median sternotomy, septal myectomy, MVR - [facility] (Dr. Silvestre MesiGlower)  . Cardiac defibrillator placement  01/2012    BSX dual chamber ICD  . Myomectomy    . Pericardial window  03/14/2012    Procedure: PERICARDIAL WINDOW;  Surgeon: Purcell Nailslarence H Owen, MD;  [facility];  Service: Open Heart Surgery;  Laterality: N/A;  Subxyphoid Pericardial  Window   . Ablation  11-16-13    RFCA of atrial flutter by Dr Ladona Ridgelaylor   . Insert / replace / remove pacemaker    . Mitral valve repair    . Cholecystectomy N/A 01/26/2014    LAPAROSCOPIC CHOLECYSTECTOMY WITH  INTRAOPERATIVE CHOLANGIOGRAM;  Surgeon: Wilmon ArmsMatthew K. Tsuei, MD     Family History  Problem Relation Age of Onset  . Diabetes Father   . Heart disease Father     unspecified heart problem  .  Thyroid disease Mother   . Anemia Mother     mediterranean anemia, ITP  . Heart disease Maternal Grandfather     CHF  . Diabetes Paternal Grandfather   . Cancer Paternal Grandfather   . Coronary artery disease Neg Hx   . Stroke Neg Hx   . Sudden death Neg Hx      History   Social History  . Marital Status: Single    Spouse Name: N/A    Number of Children: 1  . Years of Education: N/A   Occupational History  . Naval architectrestaurant manager    Social History Main Topics  . Smoking status: Former Smoker -- 1.00 packs/day for 3 years    Types: Cigarettes    Quit date: 09/30/1988  . Smokeless tobacco: Never Used     Comment: quitin 1990  . Alcohol Use: No     Comment: none since "months" as of 02/28/2013  . Drug Use: No     Comment: quiti n 1989  . Sexual Activity: Yes    Birth Control/ Protection: Pill   Other Topics Concern  . Not on file   Social History Narrative   Caffeine: 1 cup coffee/day   Divorced, 1 child.  lives with boyfriend, dogs   Occupation: Full time Tax adviserfast food restaurant manager   Activity: walks 1830min/day   Diet: fruits/vegetables daily, water, no fish     BP 114/78  Pulse 75  Ht 5' (1.524 m)  Wt 209 lb (94.802 kg)  BMI 40.82 kg/m2  LMP 01/22/2014  Physical Exam:  Stable appearing obese, 43 year old woman, NAD HEENT: Unremarkable Neck:  7 cm JVD, no thyromegally Back:  No CVA tenderness Lungs:  Clear wwith no wheezes, rales, or rhonchi HEART:  Regular rate rhythm, no murmurs, no rubs, no clicks Abd:  soft, positive bowel sounds, no organomegally, no rebound, no guarding Ext:  2 plus pulses, no edema, no cyanosis, no clubbing Skin:  No rashes no nodules Neuro:  CN II through XII intact, motor grossly intact  DEVICE  Normal device function.  See PaceArt  for details.   Assess/Plan:

## 2014-02-11 ENCOUNTER — Encounter (INDEPENDENT_AMBULATORY_CARE_PROVIDER_SITE_OTHER): Payer: Self-pay | Admitting: Surgery

## 2014-02-11 ENCOUNTER — Ambulatory Visit (INDEPENDENT_AMBULATORY_CARE_PROVIDER_SITE_OTHER): Payer: BC Managed Care – PPO | Admitting: Surgery

## 2014-02-11 VITALS — BP 102/62 | HR 75 | Temp 97.6°F | Resp 12 | Ht 60.0 in | Wt 210.2 lb

## 2014-02-11 DIAGNOSIS — K8010 Calculus of gallbladder with chronic cholecystitis without obstruction: Secondary | ICD-10-CM

## 2014-02-11 NOTE — Progress Notes (Signed)
Status post laparoscopic cholecystectomy with intraoperative cholangiogram on 01/26/14. The patient was discharged home the next day. Her symptoms overall are much improved. She does not have the chronic right upper quadrant and right back pain that she was having before her surgery. Her appetite is improved. She has some constipation for the first couple of days after surgery but this seems to be improving. She denies any diarrhea. She did have a followup visit with the cardiologist who feels that her AICD/pacemaker is functioning normally.  Her incisions are all well-healed with no sign of infection. She did have some bruising around her umbilicus but this seems to be resolving.  The patient may resume a regular diet and regular activity. Followup as needed.  Wilmon ArmsMatthew K. Corliss Skainssuei, MD, [facility]  General/ Trauma Surgery  02/11/2014 11:12 AM

## 2014-02-15 ENCOUNTER — Encounter: Payer: Self-pay | Admitting: Internal Medicine

## 2014-02-28 ENCOUNTER — Ambulatory Visit: Payer: BC Managed Care – PPO | Admitting: Neurology

## 2014-03-04 ENCOUNTER — Telehealth: Payer: Self-pay

## 2014-03-04 MED ORDER — ALPRAZOLAM 1 MG PO TABS: 1.0000 mg | ORAL_TABLET | Freq: Two times a day (BID) | ORAL | Status: DC | PRN

## 2014-03-04 MED ORDER — ONDANSETRON HCL 4 MG PO TABS: 4.0000 mg | ORAL_TABLET | Freq: Three times a day (TID) | ORAL | Status: DC | PRN

## 2014-03-04 NOTE — Telephone Encounter (Signed)
Pt left vm requesting refill of alprazolam and ondansetron to [facility] [address].Please advise.

## 2014-03-04 NOTE — Telephone Encounter (Signed)
Rx called in as directed.   

## 2014-03-04 NOTE — Telephone Encounter (Signed)
plz phone in. 

## 2014-03-07 MED ORDER — ALPRAZOLAM 1 MG PO TABS: 1.0000 mg | ORAL_TABLET | Freq: Two times a day (BID) | ORAL | Status: DC | PRN

## 2014-03-07 NOTE — Telephone Encounter (Addendum)
Pt left v/m; pt said on last refill of alprazolam was only given # 30 and pt takes at least 2 tabs daily and sometimes 3 tabs daily. Pt request another rx sent to pharmacy with larger quantity [facility] [address] Pt request cb. When called in to pharmacy.

## 2014-03-07 NOTE — Telephone Encounter (Signed)
Plz phone in and notify pt 

## 2014-03-07 NOTE — Addendum Note (Signed)
Addended by: Eustaquio Boyden on: 03/07/2014 12:55 PM   Modules accepted: Orders

## 2014-03-07 NOTE — Telephone Encounter (Addendum)
Spoke with pharmacy and patient had already picked up the #30. I advised to fill an additional #60 when allowed (in 15 days). Pharmacy would put Rx on hold. Patient notified and verbalized understanding.

## 2014-03-08 ENCOUNTER — Telehealth: Payer: Self-pay

## 2014-03-08 DIAGNOSIS — I5042 Chronic combined systolic (congestive) and diastolic (congestive) heart failure: Secondary | ICD-10-CM

## 2014-03-08 DIAGNOSIS — E669 Obesity, unspecified: Secondary | ICD-10-CM

## 2014-03-08 DIAGNOSIS — I422 Other hypertrophic cardiomyopathy: Secondary | ICD-10-CM

## 2014-03-08 NOTE — Telephone Encounter (Signed)
Pt said she is pre diabetic and has heart issues and needs to lose weight. Pt wants to know if OK with Dr Reece Agar if pt starts Nutrasystem diabetic diet plan that will also limit her sodium intake to 1500 mg. Pt request cb.

## 2014-03-09 ENCOUNTER — Encounter: Payer: Self-pay | Admitting: Neurology

## 2014-03-09 ENCOUNTER — Ambulatory Visit (INDEPENDENT_AMBULATORY_CARE_PROVIDER_SITE_OTHER): Payer: BC Managed Care – PPO | Admitting: Neurology

## 2014-03-09 ENCOUNTER — Telehealth (INDEPENDENT_AMBULATORY_CARE_PROVIDER_SITE_OTHER): Payer: Self-pay

## 2014-03-09 VITALS — BP 118/62 | HR 80 | Resp 18 | Ht 60.0 in | Wt 212.0 lb

## 2014-03-09 DIAGNOSIS — R209 Unspecified disturbances of skin sensation: Secondary | ICD-10-CM

## 2014-03-09 DIAGNOSIS — R202 Paresthesia of skin: Principal | ICD-10-CM

## 2014-03-09 DIAGNOSIS — R200 Anesthesia of skin: Secondary | ICD-10-CM

## 2014-03-09 DIAGNOSIS — R208 Other disturbances of skin sensation: Secondary | ICD-10-CM

## 2014-03-09 NOTE — Patient Instructions (Addendum)
1.  Will go up on the gabapentin 300mg  pills:  Take 1pill in AM, 1 pill at noon, and 2 pills at bedtime for 7 days  Take 2 pills in AM, 1 pill at noon, and 2 pills at bedtime for 7 days  Then 2 pill three times daily 2.  Schedule NCV-EMG for neuropathy. We will call you with an appt.  3.  Follow up after EMG.

## 2014-03-09 NOTE — Telephone Encounter (Signed)
referral placed

## 2014-03-09 NOTE — Progress Notes (Signed)
NEUROLOGY FOLLOW UP OFFICE NOTE  [name] [id number]  HISTORY OF PRESENT ILLNESS: [name] is a 43 year old woman with HOCM s/p AICD who follows up for episode of numbness and tingling.  Records personally reviewed.   UPDATE: Recommended NCV-EMG, but she cancelled the appointment and didn't follow up again.  Since last visit, she has been to the hospital several times.  She had been experiencing VTach.  Her AICD was interrogated and settings were adjusted.  In April, she underwent cholecystectomy.  She continues to have humming and burning in her head and ears.  She also notes that 3 weeks ago, she began experiencing burning on the bottom of her left foot.  It comes and goes and occurs spontaneously, not triggered by movement, position or weight-bearing.  No radicular pain down the leg.  No focal weakness.  She also has a burning pain in her abdomen since the surgery.  HISTORY: She presented to the ED on 04/19/13 after developing numbness of the head, arms and hands.  She described pins and needles sensation, intermittently, involving both feet/toes and hands/fingers, as well as left side of face.  No associated pain. She denies sudomotor dysfunction.  She does have sometimes constipation and diarrhea.  She has associated chronic headache, described as a humming in her head.  She also notes lightheadedness, which she attributes to medication changes.  She denies visual disturbance. She also complained of chest pain as well.  She denied visual disturbance or focal weakness.  She had headache, which is chronic for several months.  She also reported lightheadedness, which she attributes to having started a new heart medication.  ECG revealed a paced rhythm of 76 bpm with LBBB and non-specific T-wave changes.  CT of the brain was unremarkable.  TSH from 01/19/13 was 3.840.  Hgb A1c from 06/14/12 was 5.5.  Lyme was positive but confirmatory test was negative.  B12 level was 221 but methylmalonic  acid level was normal. Sjogren's antibodies negatative, SPEP revealed nonspecific diffuse polyclonal type increase in gamma globulins but IFE was normal.  ANA negative, ESR 13, B1 18, E 15, B6 8.3.  2hr glucose tolerance test 157.  She notes some lightheadedness but no syncope.  She denies dry eyes or dry mouth.    She takes gabapentin 359m three times daily.  PAST MEDICAL HISTORY: Past Medical History  Diagnosis Date  . Hypertrophic cardiomyopathy     s/p septal myomectomy with implantable defibrillator 01/27/2012   . Anxiety state, unspecified   . Allergic rhinitis     to dogs  . Asthma   . Anemia     during pregnancy  . History of chicken pox   . Generalized headaches   . Ocular migraine     no HA  . Back pain     told cracked bone, vicodin didn't help, improved with percocets/muscle relaxants, no eval by prior PCP  . 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 DVT (deep venous thrombosis) 01/2012    was placed on Warfarin after cardiac surgery but had skin reaction to warfarin so was changed to Pradaxa so DVT occurred while on Pradaxa although unclear if it occurred during transition  . Hypotension   . Atrial fibrillation     a. On pradaxa.  coumadin necrosis with warfarin;  b. Amio d/c'd 02/2012  . Pericardial effusion   . Chronic combined systolic and diastolic CHF (congestive heart  failure)     a. echo 03/19/12: severe asymmetric septal hypertrophy, evidence of upper septal myomectomy, peak LV outflow tract gradient 35, EF 35%,;  b.  Echo 9/13: Severe LVH, EF 30-35%, anteroseptal and inferior HK, LVOT narrow, mild AI, mild to moderate mitral stenosis, severe LAE  . Pericardial tamponade 03/14/2012  . 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 01/2012 s/p BSX ICD;  b. 06/2012 Multaq initiated due to recurrent syncope, VT/VF - had severe n/v with IV amio.  . Skin necrosis --COUMADIN   . Chronic chest wall pain     eval by CVTS,  stable, rec return to [facility] if continued pain  . Syncope     a. in setting of VT/VF.  Marland Kitchen Hypothyroid   . Blood transfusion without reported diagnosis   . Neuromuscular disorder   . Gall stones   . Atrial flutter 10/2013    with inappropriate ICD therapy; s/p CTI ablation by Dr Lovena Le 11/16/2013  . Automatic implantable cardioverter-defibrillator in situ   . Dysrhythmia   . Pacemaker   . Complication of anesthesia   . PONV (postoperative nausea and vomiting)   . Depression   . Post-traumatic stress   . Fibromyalgia     MEDICATIONS: Current Outpatient Prescriptions on File Prior to Visit  Medication Sig Dispense Refill  . acetaminophen (TYLENOL) 500 MG tablet Take 500-1,000 mg by mouth 2 (two) times daily as needed for mild pain, moderate pain, fever or headache (pain).       Marland Kitchen albuterol (PROVENTIL HFA;VENTOLIN HFA) 108 (90 BASE) MCG/ACT inhaler Inhale 1 puff into the lungs every 4 (four) hours as needed for wheezing or shortness of breath.       . ALPRAZolam (XANAX) 1 MG tablet Take 1 tablet (1 mg total) by mouth 2 (two) times daily as needed for anxiety.  90 tablet  0  . aspirin EC 81 MG tablet Take 81 mg by mouth daily.      . beclomethasone (QVAR) 80 MCG/ACT inhaler Inhale 1 puff into the lungs 2 (two) times daily.      . cetirizine (ZYRTEC) 10 MG tablet Take 10 mg by mouth daily.       Marland Kitchen docusate sodium (COLACE) 100 MG capsule Take 100 mg by mouth 2 (two) times daily as needed for mild constipation.      . furosemide (LASIX) 40 MG tablet Take 1 tablet (40 mg total) by mouth daily.  30 tablet  5  . gabapentin (NEURONTIN) 300 MG capsule Take 1 capsule (300 mg total) by mouth 3 (three) times daily.  90 capsule  6  . levothyroxine (SYNTHROID, LEVOTHROID) 50 MCG tablet Take 50 mcg by mouth daily before breakfast.       . magnesium gluconate (MAGONATE) 500 MG tablet Take 250 mg by mouth 2 (two) times daily.      . metoprolol succinate (TOPROL-XL) 25 MG 24 hr tablet Take 1 tablet (25 mg  total) by mouth daily.  30 tablet  6  . mexiletine (MEXITIL) 150 MG capsule Take 2 capsules (300 mg total) by mouth 3 (three) times daily.  180 capsule  6  . ondansetron (ZOFRAN) 4 MG tablet Take 1 tablet (4 mg total) by mouth 3 (three) times daily as needed.  20 tablet  0  . oxyCODONE-acetaminophen (PERCOCET/ROXICET) 5-325 MG per tablet Take 1-2 tablets by mouth every 4 (four) hours as needed for moderate pain.  40 tablet  0  . polyethylene glycol powder (GLYCOLAX/MIRALAX) powder Take 17  g by mouth daily as needed (constipation).       . Potassium Gluconate 595 MG CAPS Take 595 mg by mouth daily.      . Probiotic Product (PROBIOTIC PO) Take 1 tablet by mouth daily.       . ranolazine (RANEXA) 1000 MG SR tablet Take 1 tablet (1,000 mg total) by mouth 2 (two) times daily.  180 tablet  3   No current facility-administered medications on file prior to visit.    ALLERGIES: Allergies  Allergen Reactions  . Phenergan [Promethazine Hcl] Itching  . Warfarin And Related Other (See Comments)    'burns my skin'  . Amiodarone Nausea And Vomiting    Amiodarone tablets cause nausea Can tolerate IV Amiodarone  . Nitroglycerin Other (See Comments)    Hypotension    FAMILY HISTORY: Family History  Problem Relation Age of Onset  . Diabetes Father   . Heart disease Father     unspecified heart problem  . Thyroid disease Mother   . Anemia Mother     mediterranean anemia, ITP  . Heart disease Maternal Grandfather     CHF  . Diabetes Paternal Grandfather   . Cancer Paternal Grandfather   . Coronary artery disease Neg Hx   . Stroke Neg Hx   . Sudden death Neg Hx     SOCIAL HISTORY: History   Social History  . Marital Status: Single    Spouse Name: N/A    Number of Children: 1  . Years of Education: N/A   Occupational History  . Scientist, clinical (histocompatibility and immunogenetics)    Social History Main Topics  . Smoking status: Former Smoker -- 1.00 packs/day for 3 years    Types: Cigarettes    Quit date:  09/30/1988  . Smokeless tobacco: Never Used     Comment: quitin 1990  . Alcohol Use: No     Comment: none since "months" as of 02/28/2013  . Drug Use: No     Comment: quiti n 1989  . Sexual Activity: Yes    Birth Control/ Protection: Pill   Other Topics Concern  . Not on file   Social History Narrative   Caffeine: 1 cup coffee/day   Divorced, 1 child.  lives with boyfriend, dogs   Occupation: Full time Tax adviser   Activity: walks 41mn/day   Diet: fruits/vegetables daily, water, no fish    REVIEW OF SYSTEMS: Constitutional: No fevers, chills, or sweats, no generalized fatigue, change in appetite Eyes: No visual changes, double vision, eye pain Ear, nose and throat: No hearing loss, ear pain, nasal congestion, sore throat Cardiovascular: No chest pain, palpitations Respiratory:  No shortness of breath at rest or with exertion, wheezes GastrointestinaI: abdominal pain Genitourinary:  No dysuria, urinary retention or frequency Musculoskeletal:  Back pain Integumentary: No rash, pruritus, skin lesions Neurological: as above Psychiatric: depression, anxiety Endocrine: No palpitations, fatigue, diaphoresis, mood swings, change in appetite, change in weight, increased thirst Hematologic/Lymphatic:  No anemia, purpura, petechiae. Allergic/Immunologic: no itchy/runny eyes, nasal congestion, recent allergic reactions, rashes  PHYSICAL EXAM: Filed Vitals:   03/09/14 1446  BP: 118/62  Pulse: 80  Resp: 18   General: No acute distress Head:  Normocephalic/atraumatic Neck: supple, no paraspinal tenderness, full range of motion Heart:  Regular rate and rhythm Lungs:  Clear to auscultation bilaterally Back: No paraspinal tenderness Neurological Exam: alert and oriented to person, place, and time. Attention span and concentration intact, recent and remote memory intact, fund of knowledge intact.  Speech  fluent and not dysarthric, language intact.  CN II-XII intact.  Fundoscopic exam unremarkable without vessel changes, exudates, hemorrhages or papilledema.  Bulk and tone normal, muscle strength 5/5 throughout.  Sensation to light touch, temperature and vibration intact.  Deep tendon reflexes 2+ throughout, toes downgoing.  Finger to nose and heel to shin testing intact.  Gait normal, Romberg negative.  IMPRESSION: 1.  Numbness and tingling, etiology unknown.  Neuropathy workup unremarkable. 2.  Abdominal burning, likely related to the surgery (possible neuralgia) 3.  Burning in the head.  Etiology unknown.  Possibly anxiety-related.  PLAN: 1.  Will titrate gabapentin to 662m three times daily to address head burning and abdominal discomfort 2.  NCV-EMG 3.  Discuss with surgeons regarding abdominal discomfort. 4.  Follow up after EMG.  AMetta Clines DO  CC:  JRia Bush MD

## 2014-03-09 NOTE — Telephone Encounter (Signed)
Patient notified as instructed by telephone. Pt would like referral for nutritionist.

## 2014-03-09 NOTE — Telephone Encounter (Signed)
Ok to do nutrasystem diabetic diet plan. Due to her medical history would also offer nutritionist referral to come up with customized diet for her.

## 2014-03-09 NOTE — Telephone Encounter (Signed)
Pt s/p lap chole with intraoperative cholangiogram on 01/26/14 by Dr Corliss Skains. Pt states that for the last week she has had a burning sensation in her upper abdomen. She states that this comes and goes. Pt states that she has also noticed that her BM's are more "sticky" but normal in color. Pt states that she has had some nausea with these episodes also. Pt denies any vomiting, fevers or chills. Pt went to see her PCP, and he advised her to call our office there could be something going on with her bile duct.  Informed pt that I would send Dr Corliss Skains a message he will be back in the office tomorrow for further recommendations. Pt verbalized understanding and agrees with POC.

## 2014-03-10 ENCOUNTER — Other Ambulatory Visit (INDEPENDENT_AMBULATORY_CARE_PROVIDER_SITE_OTHER): Payer: Self-pay | Admitting: Surgery

## 2014-03-10 DIAGNOSIS — K8010 Calculus of gallbladder with chronic cholecystitis without obstruction: Secondary | ICD-10-CM

## 2014-03-10 NOTE — Telephone Encounter (Signed)
Please obtain CBC and CMET. Orders are in EPIC.  This should help rule out any post-op complications.

## 2014-03-10 NOTE — Telephone Encounter (Signed)
Called patient this morning to let her know that Dr Corliss Skains had order lab work on her, she stated that she will have to call to get [place]. I told her if she can do it today or tomorrow , she stated she will try, I told her to try or if not please go on Monday to get lab work done. I told her where to go. And I told her that this should help rule out any post op complications

## 2014-03-11 ENCOUNTER — Other Ambulatory Visit (INDEPENDENT_AMBULATORY_CARE_PROVIDER_SITE_OTHER): Payer: Self-pay | Admitting: *Deleted

## 2014-03-11 DIAGNOSIS — K8010 Calculus of gallbladder with chronic cholecystitis without obstruction: Secondary | ICD-10-CM

## 2014-03-14 LAB — COMPREHENSIVE METABOLIC PANEL
ALT: 26 U/L (ref 0–35)
AST: 26 U/L (ref 0–37)
Albumin: 4 g/dL (ref 3.5–5.2)
Alkaline Phosphatase: 55 U/L (ref 39–117)
BILIRUBIN TOTAL: 0.5 mg/dL (ref 0.2–1.2)
BUN: 14 mg/dL (ref 6–23)
CO2: 28 mEq/L (ref 19–32)
Calcium: 8.8 mg/dL (ref 8.4–10.5)
Chloride: 103 mEq/L (ref 96–112)
Creat: 0.82 mg/dL (ref 0.50–1.10)
Glucose, Bld: 119 mg/dL — ABNORMAL HIGH (ref 70–99)
POTASSIUM: 3.9 meq/L (ref 3.5–5.3)
Sodium: 139 mEq/L (ref 135–145)
TOTAL PROTEIN: 6.9 g/dL (ref 6.0–8.3)

## 2014-03-14 LAB — CBC
HCT: 40.7 % (ref 36.0–46.0)
Hemoglobin: 13.8 g/dL (ref 12.0–15.0)
MCH: 29.6 pg (ref 26.0–34.0)
MCHC: 33.9 g/dL (ref 30.0–36.0)
MCV: 87.2 fL (ref 78.0–100.0)
PLATELETS: 246 10*3/uL (ref 150–400)
RBC: 4.67 MIL/uL (ref 3.87–5.11)
RDW: 14.8 % (ref 11.5–15.5)
WBC: 4.8 10*3/uL (ref 4.0–10.5)

## 2014-03-14 NOTE — Telephone Encounter (Signed)
plz clarify with patient and pharmacy - should be on alprazolam 1mg  bid - tid, not lorazepam.

## 2014-03-14 NOTE — Telephone Encounter (Addendum)
Sounds like pharmacy error - would have patient check to see if pharmacy will replace her lorazepam with the alprazolam which is what I ordered.

## 2014-03-14 NOTE — Telephone Encounter (Addendum)
Spoke with Kathie RhodesBetty at pharmacy again. Called in ALPRAZOLAM 1 MG BID #60 with 0 RF. Kathie RhodesBetty was willing to waive co-pay for patient. Patient notified.

## 2014-03-14 NOTE — Telephone Encounter (Signed)
Pt said the Lorazepam 1 mg is not helping pt; pt picked up Lorazepam (generic Ativan on 03/08/14 # 60). Today is the first day pt has taken Lorazepam. Pt wants to know if can switch back to alprazolam 1 mg and how soon can she take an alprazolam when took Lorazepam this AM at 9:30. Pt request cb. [facility] [address]. Do not see lorazepam on pts med list [facility] [address] and she said Lorazepam 1 mg taking 1 tab bid prn # 60 was picked up on 03/08/14. Please advise.

## 2014-03-14 NOTE — Telephone Encounter (Signed)
Spoke with Kathie RhodesBetty at the pharmacy. She had it down that I called alprazolam on 03/04/14 and when I called to confirm if patient picked up the first #30 on 03/07/14 and put the rest on hold, the pharmacist wrote lorazepam to put on hold. She had the hard copy to look at. So, now the patient picked up lorazepam that was on hold by mistake and needs the alprazolam. Pharmacy error-not ours. What would you like to do?

## 2014-03-15 ENCOUNTER — Telehealth (INDEPENDENT_AMBULATORY_CARE_PROVIDER_SITE_OTHER): Payer: Self-pay | Admitting: General Surgery

## 2014-03-15 NOTE — Telephone Encounter (Signed)
Message copied by Wilder GladeMCPHERSON, ANNIE on Tue Mar 15, 2014  8:48 AM ------      Message from: Wynona LunaSUEI, MATTHEW K      Created: Tue Mar 15, 2014  6:59 AM       Please call the patient and let them know that their labs are normal, so there does not seem to be a problem with her liver after gallbladder surgery.  There is no sign of any infection since her WBC is normal.       ------

## 2014-03-15 NOTE — Telephone Encounter (Signed)
Called patient to let her know that her blood work was normal that there was no sign of any problem with her liver after gallbladder surgery. And no sign of infection

## 2014-03-15 NOTE — Progress Notes (Signed)
Quick Note:  Please call the patient and let them know that their labs are normal, so there does not seem to be a problem with her liver after gallbladder surgery. There is no sign of any infection since her WBC is normal.  ______

## 2014-03-22 ENCOUNTER — Telehealth: Payer: Self-pay | Admitting: Cardiology

## 2014-03-22 NOTE — Telephone Encounter (Signed)
Pt called and stated that she was sleeping and started to feel dizzy and she jumped up out of her sleep. She was concerned that she had been shocked. She sent a remote transmission. There was no shock delivered, no episodes, remote transmission was normal. I informed pt of this and she verbalized understanding.

## 2014-03-28 ENCOUNTER — Other Ambulatory Visit: Payer: Self-pay | Admitting: Neurology

## 2014-04-05 ENCOUNTER — Other Ambulatory Visit: Payer: Self-pay

## 2014-04-06 LAB — CYTOLOGY - PAP

## 2014-04-11 ENCOUNTER — Other Ambulatory Visit: Payer: Self-pay | Admitting: Neurology

## 2014-04-19 ENCOUNTER — Other Ambulatory Visit: Payer: Self-pay | Admitting: Family Medicine

## 2014-04-19 ENCOUNTER — Telehealth: Payer: Self-pay | Admitting: *Deleted

## 2014-04-19 NOTE — Telephone Encounter (Signed)
Ok to refill in dr. Timoteo ExposeG's absence? Last filled 03/07/14 #90 with 0 RF.

## 2014-04-19 NOTE — Telephone Encounter (Signed)
Pt awoke suddenly this morning approximately 5am. She sent an extra transmission. Results show multiple recordings where pt will have an individual PVC or PVC couplet. Pt understands she is symptomatic w/ PVCs.

## 2014-04-19 NOTE — Telephone Encounter (Signed)
Rx called in as prescribed 

## 2014-04-19 NOTE — Telephone Encounter (Signed)
Px written for call in   Refilling once in Dr Reece AgarG absence

## 2014-04-21 ENCOUNTER — Other Ambulatory Visit: Payer: Self-pay | Admitting: *Deleted

## 2014-04-21 MED ORDER — LEVOTHYROXINE SODIUM 50 MCG PO TABS: 50.0000 ug | ORAL_TABLET | Freq: Every day | ORAL | Status: DC

## 2014-04-26 ENCOUNTER — Ambulatory Visit: Payer: BC Managed Care – PPO | Admitting: *Deleted

## 2014-04-28 ENCOUNTER — Encounter: Payer: BC Managed Care – PPO | Admitting: Neurology

## 2014-05-07 ENCOUNTER — Other Ambulatory Visit: Payer: Self-pay | Admitting: Neurology

## 2014-05-10 ENCOUNTER — Telehealth: Payer: Self-pay | Admitting: *Deleted

## 2014-05-10 ENCOUNTER — Other Ambulatory Visit: Payer: Self-pay | Admitting: *Deleted

## 2014-05-10 DIAGNOSIS — R202 Paresthesia of skin: Principal | ICD-10-CM

## 2014-05-10 DIAGNOSIS — R200 Anesthesia of skin: Secondary | ICD-10-CM

## 2014-05-10 MED ORDER — GABAPENTIN 300 MG PO CAPS: 600.0000 mg | ORAL_CAPSULE | Freq: Three times a day (TID) | ORAL | Status: DC

## 2014-05-10 NOTE — Telephone Encounter (Signed)
Error

## 2014-05-10 NOTE — Telephone Encounter (Signed)
Patient has some question about her Rx for Gabapentin please advise Call back # [phone]

## 2014-05-16 ENCOUNTER — Encounter: Payer: Self-pay | Admitting: Internal Medicine

## 2014-05-16 ENCOUNTER — Ambulatory Visit (INDEPENDENT_AMBULATORY_CARE_PROVIDER_SITE_OTHER): Payer: BC Managed Care – PPO | Admitting: Internal Medicine

## 2014-05-16 VITALS — BP 109/66 | HR 79 | Ht 60.0 in | Wt 210.0 lb

## 2014-05-16 DIAGNOSIS — I422 Other hypertrophic cardiomyopathy: Secondary | ICD-10-CM

## 2014-05-16 DIAGNOSIS — I472 Ventricular tachycardia, unspecified: Secondary | ICD-10-CM

## 2014-05-16 DIAGNOSIS — I4891 Unspecified atrial fibrillation: Secondary | ICD-10-CM

## 2014-05-16 DIAGNOSIS — I4729 Other ventricular tachycardia: Secondary | ICD-10-CM

## 2014-05-16 DIAGNOSIS — Z95810 Presence of automatic (implantable) cardiac defibrillator: Secondary | ICD-10-CM

## 2014-05-16 LAB — MDC_IDC_ENUM_SESS_TYPE_INCLINIC
Brady Statistic RA Percent Paced: 86 %
Brady Statistic RV Percent Paced: 1 % — CL
HighPow Impedance: 54 Ohm
Implantable Pulse Generator Serial Number: 102591
Lead Channel Impedance Value: 628 Ohm
Lead Channel Pacing Threshold Amplitude: 0.8 V
Lead Channel Sensing Intrinsic Amplitude: 19.2 mV
Lead Channel Sensing Intrinsic Amplitude: 6.5 mV
Lead Channel Setting Pacing Amplitude: 2.4 V
Lead Channel Setting Sensing Sensitivity: 0.6 mV
MDC IDC MSMT LEADCHNL RA PACING THRESHOLD PULSEWIDTH: 0.5 ms
MDC IDC MSMT LEADCHNL RV IMPEDANCE VALUE: 529 Ohm
MDC IDC MSMT LEADCHNL RV PACING THRESHOLD AMPLITUDE: 1.1 V
MDC IDC MSMT LEADCHNL RV PACING THRESHOLD PULSEWIDTH: 0.5 ms
MDC IDC SET LEADCHNL RA PACING AMPLITUDE: 2 V
MDC IDC SET LEADCHNL RV PACING PULSEWIDTH: 0.5 ms
MDC IDC SET ZONE DETECTION INTERVAL: 273 ms
Zone Setting Detection Interval: 333 ms

## 2014-05-16 MED ORDER — APIXABAN 5 MG PO TABS: 5.0000 mg | ORAL_TABLET | Freq: Two times a day (BID) | ORAL | Status: DC

## 2014-05-16 MED ORDER — METOPROLOL SUCCINATE ER 25 MG PO TB24: 25.0000 mg | ORAL_TABLET | Freq: Two times a day (BID) | ORAL | Status: DC

## 2014-05-16 NOTE — Assessment & Plan Note (Signed)
She has a history of both ventricular tachycardia and ventricular fibrillation, status post ICD implantation. She is on multiple antiarrhythmic medications. I've recommended that she increase her dose of Toprol from 25 mg daily to 25 mg twice daily as tolerated.

## 2014-05-16 NOTE — Assessment & Plan Note (Signed)
She is actually had both atrial flutter and atrial fibrillation in the past and is at risk for stroke. She has had a history of allergy to Coumadin in the past. I've recommended starting Eliquis.

## 2014-05-16 NOTE — Assessment & Plan Note (Signed)
Her [employer] dual-chamber ICD is working normally. We'll plan to recheck in several months.

## 2014-05-16 NOTE — Assessment & Plan Note (Signed)
She is status post surgery and her symptoms are well-controlled. She will continue her current medical therapy.

## 2014-05-16 NOTE — Progress Notes (Signed)
HPI Mrs. Raus returns today for followup. She is a very pleasant 43 yo woman with HCM, s/p myomectomy, VT/VF who has been on multiple medications and had multiple ICD shocks. She came in several months ago with a 1:1 tachycardia and was found to have atrial flutter with an RVR. She was successfully ablated. She had undergone gall bladder surgery. Her heart failure is class 2.  She is appropriately anxious. She has gone back to walking.  Allergies  Allergen Reactions  . Phenergan [Promethazine Hcl] Itching  . Warfarin And Related Other (See Comments)    'burns my skin'  . Amiodarone Nausea And Vomiting    Amiodarone tablets cause nausea Can tolerate IV Amiodarone  . Nitroglycerin Other (See Comments)    Hypotension     Current Outpatient Prescriptions  Medication Sig Dispense Refill  . acetaminophen (TYLENOL) 500 MG tablet Take 500-1,000 mg by mouth 2 (two) times daily as needed for mild pain, moderate pain, fever or headache (pain).       Marland Kitchen albuterol (PROVENTIL HFA;VENTOLIN HFA) 108 (90 BASE) MCG/ACT inhaler Inhale 1 puff into the lungs every 4 (four) hours as needed for wheezing or shortness of breath.       . ALPRAZolam (XANAX) 1 MG tablet TAKE ONE TABLET BY MOUTH TWICE DAILY AS NEEDED  60 tablet  0  . aspirin EC 81 MG tablet Take 81 mg by mouth daily.      . beclomethasone (QVAR) 80 MCG/ACT inhaler Inhale 1 puff into the lungs 2 (two) times daily.      . cetirizine (ZYRTEC) 10 MG tablet Take 10 mg by mouth daily.       Marland Kitchen docusate sodium (COLACE) 100 MG capsule Take 100 mg by mouth 2 (two) times daily as needed for mild constipation.      . furosemide (LASIX) 40 MG tablet Take 1 tablet (40 mg total) by mouth daily.  30 tablet  5  . gabapentin (NEURONTIN) 300 MG capsule Take 2 capsules (600 mg total) by mouth 3 (three) times daily.  180 capsule  1  . levothyroxine (SYNTHROID, LEVOTHROID) 50 MCG tablet Take 1 tablet (50 mcg total) by mouth daily before breakfast.  30 tablet  3  .  magnesium gluconate (MAGONATE) 500 MG tablet Take 250 mg by mouth 2 (two) times daily.      . metoprolol succinate (TOPROL-XL) 25 MG 24 hr tablet Take 1 tablet (25 mg total) by mouth daily.  30 tablet  6  . mexiletine (MEXITIL) 150 MG capsule Take 2 capsules (300 mg total) by mouth 3 (three) times daily.  180 capsule  6  . ondansetron (ZOFRAN) 4 MG tablet Take 1 tablet (4 mg total) by mouth 3 (three) times daily as needed.  20 tablet  0  . oxyCODONE-acetaminophen (PERCOCET/ROXICET) 5-325 MG per tablet Take 1-2 tablets by mouth every 4 (four) hours as needed for moderate pain.  40 tablet  0  . polyethylene glycol powder (GLYCOLAX/MIRALAX) powder Take 17 g by mouth daily as needed (constipation).       . Potassium Gluconate 595 MG CAPS Take 595 mg by mouth daily.      . Probiotic Product (PROBIOTIC PO) Take 1 tablet by mouth daily.       . ranolazine (RANEXA) 1000 MG SR tablet Take 1 tablet (1,000 mg total) by mouth 2 (two) times daily.  180 tablet  3   No current facility-administered medications for this visit.     Past Medical History  Diagnosis Date  . Hypertrophic cardiomyopathy     s/p septal myomectomy with implantable defibrillator 01/27/2012   . Anxiety state, unspecified   . Allergic rhinitis     to dogs  . Asthma   . Anemia     during pregnancy  . History of chicken pox   . Generalized headaches   . Ocular migraine     no HA  . Back pain     told cracked bone, vicodin didn't help, improved with percocets/muscle relaxants, no eval by prior PCP  . 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 DVT (deep venous thrombosis) 01/2012    was placed on Warfarin after cardiac surgery but had skin reaction to warfarin so was changed to Pradaxa so DVT occurred while on Pradaxa although unclear if it occurred during transition  . Hypotension   . Atrial fibrillation     a. On pradaxa.  coumadin necrosis with warfarin;  b. Amio d/c'd 02/2012   . Pericardial effusion   . Chronic combined systolic and diastolic CHF (congestive heart failure)     a. echo 03/19/12: severe asymmetric septal hypertrophy, evidence of upper septal myomectomy, peak LV outflow tract gradient 35, EF 35%,;  b.  Echo 9/13: Severe LVH, EF 30-35%, anteroseptal and inferior HK, LVOT narrow, mild AI, mild to moderate mitral stenosis, severe LAE  . Pericardial tamponade 03/14/2012  . 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 01/2012 s/p BSX ICD;  b. 06/2012 Multaq initiated due to recurrent syncope, VT/VF - had severe n/v with IV amio.  . Skin necrosis --COUMADIN   . Chronic chest wall pain     eval by CVTS, stable, [facility]  . Syncope     a. in setting of VT/VF.  Marland Kitchen. Hypothyroid   . Blood transfusion without reported diagnosis   . Neuromuscular disorder   . Gall stones   . Atrial flutter 10/2013    with inappropriate ICD therapy; s/p CTI ablation by Dr Ladona Ridgelaylor 11/16/2013  . Automatic implantable cardioverter-defibrillator in situ   . Dysrhythmia   . Pacemaker   . Complication of anesthesia   . PONV (postoperative nausea and vomiting)   . Depression   . Post-traumatic stress   . Fibromyalgia     ROS:   All systems reviewed and negative except as noted in the HPI.   Past Surgical History  Procedure Laterality Date  . Cesarean section  1991  . Induced abortion  1990 and 1993  . Cardiac surgery  01/27/2012    median sternotomy, septal myectomy, MVR - [facility] (Dr. Silvestre MesiGlower)  . Cardiac defibrillator placement  01/2012    BSX dual chamber ICD  . Myomectomy    . Pericardial window  03/14/2012    Procedure: PERICARDIAL WINDOW;  Surgeon: Purcell Nailslarence H Owen, MD;  [facility];  Service: Open Heart Surgery;  Laterality: N/A;  Subxyphoid Pericardial  Window   . Ablation  11-16-13    RFCA of atrial flutter by Dr Ladona Ridgelaylor   . Insert / replace / remove pacemaker    . Mitral valve repair    . Cholecystectomy N/A 01/26/2014    LAPAROSCOPIC  CHOLECYSTECTOMY WITH INTRAOPERATIVE CHOLANGIOGRAM;  Surgeon: Wilmon ArmsMatthew K. Tsuei, MD     Family History  Problem Relation Age of Onset  . Diabetes Father   . Heart disease Father     unspecified heart problem  . Thyroid disease  Mother   . Anemia Mother     mediterranean anemia, ITP  . Heart disease Maternal Grandfather     CHF  . Diabetes Paternal Grandfather   . Cancer Paternal Grandfather   . Coronary artery disease Neg Hx   . Stroke Neg Hx   . Sudden death Neg Hx      History   Social History  . Marital Status: Single    Spouse Name: N/A    Number of Children: 1  . Years of Education: N/A   Occupational History  . Naval architect    Social History Main Topics  . Smoking status: Former Smoker -- 1.00 packs/day for 3 years    Types: Cigarettes    Quit date: 09/30/1988  . Smokeless tobacco: Never Used     Comment: quitin 1990  . Alcohol Use: No     Comment: none since "months" as of 02/28/2013  . Drug Use: No     Comment: quiti n 1989  . Sexual Activity: Yes    Birth Control/ Protection: Pill   Other Topics Concern  . Not on file   Social History Narrative   Caffeine: 1 cup coffee/day   Divorced, 1 child.  lives with boyfriend, dogs   Occupation: Full time Tax adviser   Activity: walks 62min/day   Diet: fruits/vegetables daily, water, no fish     BP 109/66  Pulse 79  Ht 5' (1.524 m)  Wt 210 lb (95.255 kg)  BMI 41.01 kg/m2  Physical Exam:  Stable appearing obese, 43 year-old woman, NAD HEENT: Unremarkable Neck:  7 cm JVD, no thyromegally Back:  No CVA tenderness Lungs:  Clear wwith no wheezes, rales, or rhonchi HEART:  Regular rate rhythm, no murmurs, no rubs, no clicks Abd:  soft, positive bowel sounds, no organomegally, no rebound, no guarding Ext:  2 plus pulses, no edema, no cyanosis, no clubbing Skin:  No rashes no nodules Neuro:  CN II through XII intact, motor grossly intact   Assess/Plan:

## 2014-05-16 NOTE — Patient Instructions (Addendum)
Your physician has recommended you make the following change in your medication:  1. START Eliquis 5mg  take one by mouth twice a day (pt is allergic to coumadin) 2. INCREASE Metoprolol Succinate to 25mg  take one by mouth twice a day 3. STOP Aspirin  Your physician wants you to follow-up in: 6 MONTHS with Dr Ladona Ridgelaylor.  You will receive a reminder letter in the mail two months in advance. If you don't receive a letter, please call our office to schedule the follow-up appointment.  Remote check 08/17/14.

## 2014-05-24 ENCOUNTER — Other Ambulatory Visit: Payer: Self-pay | Admitting: Cardiovascular Disease

## 2014-05-30 ENCOUNTER — Other Ambulatory Visit: Payer: Self-pay | Admitting: Family Medicine

## 2014-05-30 NOTE — Telephone Encounter (Signed)
plz phone in. 

## 2014-05-30 NOTE — Telephone Encounter (Signed)
Rx called in as directed.   

## 2014-06-01 ENCOUNTER — Encounter: Payer: Self-pay | Admitting: Internal Medicine

## 2014-06-05 ENCOUNTER — Other Ambulatory Visit: Payer: Self-pay | Admitting: Family Medicine

## 2014-06-07 NOTE — Telephone Encounter (Signed)
Rout to PCP 

## 2014-06-08 NOTE — Telephone Encounter (Signed)
plz phone in. 

## 2014-06-08 NOTE — Telephone Encounter (Signed)
Rx called in as directed.   

## 2014-06-10 ENCOUNTER — Ambulatory Visit (INDEPENDENT_AMBULATORY_CARE_PROVIDER_SITE_OTHER): Payer: BC Managed Care – PPO | Admitting: Cardiovascular Disease

## 2014-06-10 ENCOUNTER — Encounter: Payer: Self-pay | Admitting: Cardiovascular Disease

## 2014-06-10 VITALS — BP 115/68 | HR 74 | Ht 60.0 in | Wt 212.0 lb

## 2014-06-10 DIAGNOSIS — I471 Supraventricular tachycardia: Secondary | ICD-10-CM

## 2014-06-10 DIAGNOSIS — I498 Other specified cardiac arrhythmias: Secondary | ICD-10-CM

## 2014-06-10 DIAGNOSIS — I5042 Chronic combined systolic (congestive) and diastolic (congestive) heart failure: Secondary | ICD-10-CM

## 2014-06-10 NOTE — Patient Instructions (Signed)
Your physician recommends that you continue on your current medications as directed. Please refer to the Current Medication list given to you today.  Ok to take an extra lasix as needed for swelling. On those days also take an extra potassium.  Your physician recommends that you return for lab work in: 6 months prior to next office appointment with Dr Excell Seltzer  Your physician wants you to follow-up in: 6 months with Dr Theodoro Parma will receive a reminder letter in the mail two months in advance. If you don't receive a letter, please call our office to schedule the follow-up appointment.

## 2014-06-10 NOTE — Progress Notes (Signed)
HPI:  43 year old woman presenting for followup evaluation. She is followed for hypertrophic cardiomyopathy status post myomectomy. She's been followed closely by Dr. Ladona Ridgel because of refractory VT/VF as well as atrial flutter. She's had multiple ICD shocks and has been treated with various antiarrhythmic drugs. She has undergone atrial flutter ablation. She presents today for followup evaluation.  She's been doing well since her last visit. She denies chest pain, chest pressure, heart palpitations, or ICD discharges. Her medications are unchanged. She is tolerating Eliquis without bleeding problems. She does admit to some swelling periodically. No orthopnea or PND. She continues to follow regularly at the [facility] transplant center. Considering her clinical stability, she is being watched without any plans for cardiac transplantation unless her condition changes.  Outpatient Encounter Prescriptions as of 06/10/2014  Medication Sig  . acetaminophen (TYLENOL) 500 MG tablet Take 500-1,000 mg by mouth 2 (two) times daily as needed for mild pain, moderate pain, fever or headache (pain).   Marland Kitchen albuterol (PROVENTIL HFA;VENTOLIN HFA) 108 (90 BASE) MCG/ACT inhaler Inhale 1 puff into the lungs every 4 (four) hours as needed for wheezing or shortness of breath.   . ALPRAZolam (XANAX) 1 MG tablet TAKE ONE TABLET BY MOUTH TWICE DAILY AS NEEDED  . apixaban (ELIQUIS) 5 MG TABS tablet Take 1 tablet (5 mg total) by mouth 2 (two) times daily.  . beclomethasone (QVAR) 80 MCG/ACT inhaler Inhale 1 puff into the lungs 2 (two) times daily.  . cetirizine (ZYRTEC) 10 MG tablet Take 10 mg by mouth daily.   Marland Kitchen docusate sodium (COLACE) 100 MG capsule Take 100 mg by mouth 2 (two) times daily as needed for mild constipation.  . furosemide (LASIX) 40 MG tablet Take 1 tablet (40 mg total) by mouth daily.  Marland Kitchen gabapentin (NEURONTIN) 300 MG capsule Take 2 capsules (600 mg total) by mouth 3 (three) times daily.  Marland Kitchen  levothyroxine (SYNTHROID, LEVOTHROID) 50 MCG tablet Take 1 tablet (50 mcg total) by mouth daily before breakfast.  . magnesium gluconate (MAGONATE) 500 MG tablet Take 250 mg by mouth 2 (two) times daily.  . metoprolol succinate (TOPROL-XL) 25 MG 24 hr tablet Take 1 tablet (25 mg total) by mouth 2 (two) times daily.  Marland Kitchen mexiletine (MEXITIL) 150 MG capsule Take 2 capsules (300 mg total) by mouth 3 (three) times daily.  . ondansetron (ZOFRAN) 4 MG tablet Take 1 tablet (4 mg total) by mouth 3 (three) times daily as needed.  Marland Kitchen oxyCODONE-acetaminophen (PERCOCET/ROXICET) 5-325 MG per tablet Take 1-2 tablets by mouth every 4 (four) hours as needed for moderate pain.  . polyethylene glycol powder (GLYCOLAX/MIRALAX) powder Take 17 g by mouth daily as needed (constipation).   . Potassium Gluconate 595 MG CAPS Take 595 mg by mouth daily.  . Probiotic Product (PROBIOTIC PO) Take 1 tablet by mouth daily.   . ranolazine (RANEXA) 1000 MG SR tablet Take 1 tablet (1,000 mg total) by mouth 2 (two) times daily.  . [DISCONTINUED] ALPRAZolam (XANAX) 1 MG tablet TAKE ONE TABLET BY MOUTH TWICE DAILY AS NEEDED    Allergies  Allergen Reactions  . Phenergan [Promethazine Hcl] Itching  . Warfarin And Related Other (See Comments)    'burns my skin'  . Amiodarone Nausea And Vomiting    Amiodarone tablets cause nausea Can tolerate IV Amiodarone  . Nitroglycerin Other (See Comments)    Hypotension    Past Medical History  Diagnosis Date  . Hypertrophic cardiomyopathy     s/p septal myomectomy with implantable  defibrillator 01/27/2012   . Anxiety state, unspecified   . Allergic rhinitis     to dogs  . Asthma   . Anemia     during pregnancy  . History of chicken pox   . Generalized headaches   . Ocular migraine     no HA  . Back pain     told cracked bone, vicodin didn't help, improved with percocets/muscle relaxants, no eval by prior PCP  . 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 DVT (deep venous thrombosis) 01/2012    was placed on Warfarin after cardiac surgery but had skin reaction to warfarin so was changed to Pradaxa so DVT occurred while on Pradaxa although unclear if it occurred during transition  . Hypotension   . Atrial fibrillation     a. On pradaxa.  coumadin necrosis with warfarin;  b. Amio d/c'd 02/2012  . Pericardial effusion   . Chronic combined systolic and diastolic CHF (congestive heart failure)     a. echo 03/19/12: severe asymmetric septal hypertrophy, evidence of upper septal myomectomy, peak LV outflow tract gradient 35, EF 35%,;  b.  Echo 9/13: Severe LVH, EF 30-35%, anteroseptal and inferior HK, LVOT narrow, mild AI, mild to moderate mitral stenosis, severe LAE  . Pericardial tamponade 03/14/2012  . 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 01/2012 s/p BSX ICD;  b. 06/2012 Multaq initiated due to recurrent syncope, VT/VF - had severe n/v with IV amio.  . Skin necrosis --COUMADIN   . Chronic chest wall pain     eval by CVTS, stable, rec return to [facility] if continued pain  . Syncope     a. in setting of VT/VF.  Marland Kitchen Hypothyroid   . Blood transfusion without reported diagnosis   . Neuromuscular disorder   . Gall stones   . Atrial flutter 10/2013    with inappropriate ICD therapy; s/p CTI ablation by Dr Ladona Ridgel 11/16/2013  . Automatic implantable cardioverter-defibrillator in situ   . Dysrhythmia   . Pacemaker   . Complication of anesthesia   . PONV (postoperative nausea and vomiting)   . Depression   . Post-traumatic stress   . Fibromyalgia     ROS: Negative except as per HPI  BP 115/68  Pulse 74  Ht 5' (1.524 m)  Wt 212 lb (96.163 kg)  BMI 41.40 kg/m2  PHYSICAL EXAM: Pt is alert and oriented, NAD HEENT: normal Neck: JVP - normal, carotids 2+= without bruits Lungs: CTA bilaterally CV: RRR with grade 2/6 systolic murmur at the LSB Abd: soft, NT, Positive BS, no hepatomegaly Ext: trace pretibial edema,  distal pulses intact and equal Skin: warm/dry no rash  EKG:  Electronic atrial pacemaker, LBBB, heart rate 74 bpm.  2-D echocardiogram 12/08/2013: Left ventricle: HOCM with previous septal myectomy The cavity size was mildly dilated. Wall thickness was increased in a pattern of moderate LVH. Systolic function was mildly to moderately reduced. The estimated ejection fraction was in the range of 40% to 45%.  ------------------------------------------------------------ Aortic valve: Mildly calcified leaflets. Doppler: Trivial regurgitation.  ------------------------------------------------------------ Mitral valve: S/P repair with small diastolic gradient no MS Trivial MR Doppler: Valve area by pressure half-time: 3.44cm^2. Indexed valve area by pressure half-time: 1.77cm^2/m^2. Mean gradient: 9mm Hg (D). Peak gradient: 16mm Hg (D).  ------------------------------------------------------------ Left atrium: The atrium was moderately dilated.  ------------------------------------------------------------ Atrial septum: No defect or patent foramen ovale was identified.  ------------------------------------------------------------ Right ventricle: The  cavity size was normal. Wall thickness was normal. Pacer wire or catheter noted in right ventricle. Systolic function was normal.  ------------------------------------------------------------ Pulmonic valve: Structurally normal valve. Cusp separation was normal. Doppler: Transvalvular velocity was within the normal range. Trivial regurgitation.  ------------------------------------------------------------ Tricuspid valve: Structurally normal valve. Leaflet separation was normal. Doppler: Transvalvular velocity was within the normal range. Mild regurgitation.  ------------------------------------------------------------ Right atrium: The atrium was normal in  size.  ------------------------------------------------------------ Pericardium: The pericardium was normal in appearance.  ASSESSMENT AND PLAN: Hypertrophic cardiomyopathy status post septal myectomy. The patient is stable with New York Heart Association class II symptoms of heart failure. She was advised that she can take an extra furosemide and K-Dur tablet when she experiences edema. Daily weights and sodium restriction were reviewed. I will see her back in 6 months.  Atrial fibrillation/flutter. Tolerating anticoagulation without problems. She has undergone radiofrequency ablation.  Chronic mixed systolic and diastolic heart failure. As above. The patient will continue on her same medical program.  Tonny Bollman 06/10/2014 8:33 AM

## 2014-06-14 ENCOUNTER — Encounter: Payer: Self-pay | Admitting: Cardiovascular Disease

## 2014-06-21 ENCOUNTER — Encounter: Payer: Self-pay | Admitting: *Deleted

## 2014-06-21 ENCOUNTER — Encounter: Payer: BC Managed Care – PPO | Attending: Family Medicine | Admitting: *Deleted

## 2014-06-21 DIAGNOSIS — Z6841 Body Mass Index (BMI) 40.0 and over, adult: Secondary | ICD-10-CM | POA: Insufficient documentation

## 2014-06-21 DIAGNOSIS — E669 Obesity, unspecified: Secondary | ICD-10-CM | POA: Diagnosis not present

## 2014-06-21 DIAGNOSIS — Z713 Dietary counseling and surveillance: Secondary | ICD-10-CM | POA: Diagnosis not present

## 2014-06-21 NOTE — Progress Notes (Signed)
Medical Nutrition Therapy:  Appt start time: 1400 end time:  1500.  Assessment:  Primary concerns today: [name] is here for nutrition counseling.  She states she has prediabetes.  Her referral from her provider doesn't indicate prediabetes and I don't see a recent HbA1c in her lab work.  The referral is for obesity and CHF management.  However, recommendations for prediabetes will be applicable for her weight and for her heart as well. She has a family history of diabetes.  She states her neurologist indicated she might have diabetic neuropathy.   [name] is here with her boyfriend and they share grocery shopping responsibilities.  [name] does a variety of cooking methods; but she doesn't use much sodium in her cooking.   She doesn't eat out very often at all.    States she's gained about 20 pounds in 2 months after gallbladder removal surgery.  [name] has multiple health complications and is on exercise restriction as a result.     Preferred Learning Style:   No preference indicated   Learning Readiness:   Ready   MEDICATIONS: see list.  Nothing for hyperglycemia   DIETARY INTAKE: Usual eating pattern includes 2-3 meals and 3-5 snacks per day. (grazes throughout the day)  Everyday foods include proteins, fruits, starches, some vegetables, sweets.  Avoided foods include salt, caffeine, cheese, pork, bread, fried foods.    24-hr recall:  B ( AM): cheerios (sometimes honey nut).  Unsweetened vanilla soy or sweetened soy milk if the cereal isn't sweet.  Sometimes have cheese with egg whites; grits Snk ( AM):  diced peaches in juice, bananas, greek yogurt, watermelon  L ( PM): steak sometimes.  Might have salad or peanut butter and jelly on honey wheat bread.  Eats a lot of chicken breast Snk ( PM):  diced peaches in juice, bananas, greek yogurt, watermelon D ( PM): pasta with chicken.  Doesn't eat that much for dinner because she's been snacking all day Snk ( PM): ice cream.  NSA ice cream  sometimes Beverages: water, crystal light beverages (recently); sometimes water with lemon.   Usual physical activity: ADLs.  States she's restricted on how far she can walk per cardiologist. (due to syncope episodes)   Estimated energy needs: 1600 calories 180 g carbohydrates 120 g protein 44 g fat  Progress Towards Goal(s):  In progress.   Nutritional Diagnosis:  NB-1.1 Food and nutrition-related knowledge deficit As related to proper balance of fats, carbohydrates, proteins.  As evidenced by obesity and prediabetes.    Intervention:  Nutrition counseling provided.  Discussed diabetes disease process and treatment options.  Discussed physiology of diabetes and role of obesity on insulin resistance.  Encouraged moderate weight reduction to improve glucose levels.  Discussed role of diet in glucose control  Provided education on macronutrients on glucose levels.  Provided education on carb counting, importance of regularly scheduled meals/snacks, and meal planning  Discussed effects of physical activity on glucose levels and long-term glucose control.  Recommended 10 minutes of physical activity/day.  Discussed blood glucose monitoring and interpretation.  Discussed recommended target ranges and individual ranges.  Suggested monitoring 1-2 times/week and when she feels symptoms of hypo or hyper glycemia to rule out other medical issues  Described short-term complications: hyper- and hypo-glycemia.  Discussed causes,symptoms, and treatment options.  Discussed the role of prolonged elevated glucose levels on body systems.  Teaching Method Utilized:  Visual Auditory   Handouts given during visit include: Living Well with Diabetes Carb Counting and Food Label  handouts Meal Plan Card   Barriers to learning/adherence to lifestyle change: other health challenges  Demonstrated degree of understanding via:  Teach Back   Monitoring/Evaluation:  Dietary intake, exercise, and body  weight in 2 month(s).

## 2014-06-22 ENCOUNTER — Telehealth: Payer: Self-pay | Admitting: Cardiovascular Disease

## 2014-06-22 NOTE — Telephone Encounter (Signed)
Walk in pt Form " [employer]" Annual physicians Statement Dropped Off Sent to [facility] For Completion 9.23.15/km

## 2014-06-27 ENCOUNTER — Other Ambulatory Visit: Payer: Self-pay | Admitting: Internal Medicine

## 2014-06-27 MED ORDER — AMOXICILLIN 500 MG PO CAPS: ORAL_CAPSULE | ORAL | Status: DC

## 2014-07-03 ENCOUNTER — Encounter (HOSPITAL_COMMUNITY): Payer: Self-pay | Admitting: Emergency Medicine

## 2014-07-03 ENCOUNTER — Emergency Department (HOSPITAL_COMMUNITY)
Admission: EM | Admit: 2014-07-03 | Discharge: 2014-07-03 | Disposition: A | Discharge status: Home / Self Care | Payer: BC Managed Care – PPO | Attending: Emergency Medicine | Admitting: Emergency Medicine

## 2014-07-03 DIAGNOSIS — Z862 Personal history of diseases of the blood and blood-forming organs and certain disorders involving the immune mechanism: Secondary | ICD-10-CM | POA: Diagnosis not present

## 2014-07-03 DIAGNOSIS — Z8619 Personal history of other infectious and parasitic diseases: Secondary | ICD-10-CM | POA: Diagnosis not present

## 2014-07-03 DIAGNOSIS — K029 Dental caries, unspecified: Secondary | ICD-10-CM | POA: Diagnosis not present

## 2014-07-03 DIAGNOSIS — K0381 Cracked tooth: Secondary | ICD-10-CM | POA: Diagnosis not present

## 2014-07-03 DIAGNOSIS — Z95810 Presence of automatic (implantable) cardiac defibrillator: Secondary | ICD-10-CM | POA: Insufficient documentation

## 2014-07-03 DIAGNOSIS — G43809 Other migraine, not intractable, without status migrainosus: Secondary | ICD-10-CM | POA: Insufficient documentation

## 2014-07-03 DIAGNOSIS — G8929 Other chronic pain: Secondary | ICD-10-CM | POA: Insufficient documentation

## 2014-07-03 DIAGNOSIS — I4891 Unspecified atrial fibrillation: Secondary | ICD-10-CM | POA: Diagnosis not present

## 2014-07-03 DIAGNOSIS — Z86718 Personal history of other venous thrombosis and embolism: Secondary | ICD-10-CM | POA: Insufficient documentation

## 2014-07-03 DIAGNOSIS — J45909 Unspecified asthma, uncomplicated: Secondary | ICD-10-CM | POA: Insufficient documentation

## 2014-07-03 DIAGNOSIS — F419 Anxiety disorder, unspecified: Secondary | ICD-10-CM | POA: Insufficient documentation

## 2014-07-03 DIAGNOSIS — K0889 Other specified disorders of teeth and supporting structures: Secondary | ICD-10-CM

## 2014-07-03 DIAGNOSIS — S025XXA Fracture of tooth (traumatic), initial encounter for closed fracture: Secondary | ICD-10-CM

## 2014-07-03 DIAGNOSIS — I5042 Chronic combined systolic (congestive) and diastolic (congestive) heart failure: Secondary | ICD-10-CM | POA: Insufficient documentation

## 2014-07-03 DIAGNOSIS — F329 Major depressive disorder, single episode, unspecified: Secondary | ICD-10-CM | POA: Diagnosis not present

## 2014-07-03 DIAGNOSIS — Z87891 Personal history of nicotine dependence: Secondary | ICD-10-CM | POA: Insufficient documentation

## 2014-07-03 DIAGNOSIS — K088 Other specified disorders of teeth and supporting structures: Secondary | ICD-10-CM | POA: Insufficient documentation

## 2014-07-03 DIAGNOSIS — Z7951 Long term (current) use of inhaled steroids: Secondary | ICD-10-CM | POA: Diagnosis not present

## 2014-07-03 DIAGNOSIS — Z79899 Other long term (current) drug therapy: Secondary | ICD-10-CM | POA: Diagnosis not present

## 2014-07-03 DIAGNOSIS — Z7902 Long term (current) use of antithrombotics/antiplatelets: Secondary | ICD-10-CM | POA: Insufficient documentation

## 2014-07-03 DIAGNOSIS — Z872 Personal history of diseases of the skin and subcutaneous tissue: Secondary | ICD-10-CM | POA: Diagnosis not present

## 2014-07-03 DIAGNOSIS — E039 Hypothyroidism, unspecified: Secondary | ICD-10-CM | POA: Diagnosis not present

## 2014-07-03 MED ORDER — IBUPROFEN 800 MG PO TABS: 800.0000 mg | ORAL_TABLET | Freq: Three times a day (TID) | ORAL | Status: DC | PRN

## 2014-07-03 MED ORDER — HYDROCODONE-ACETAMINOPHEN 5-325 MG PO TABS
2.0000 | ORAL_TABLET | Freq: Once | ORAL | Status: AC
  Administered 2014-07-03: 2 via ORAL
  Filled 2014-07-03: qty 2

## 2014-07-03 MED ORDER — HYDROCODONE-ACETAMINOPHEN 5-325 MG PO TABS: 1.0000 | ORAL_TABLET | ORAL | Status: DC | PRN

## 2014-07-03 MED ORDER — PENICILLIN V POTASSIUM 250 MG PO TABS
500.0000 mg | ORAL_TABLET | Freq: Once | ORAL | Status: AC
  Administered 2014-07-03: 500 mg via ORAL
  Filled 2014-07-03: qty 2

## 2014-07-03 MED ORDER — PENICILLIN V POTASSIUM 500 MG PO TABS: 500.0000 mg | ORAL_TABLET | Freq: Four times a day (QID) | ORAL | Status: DC

## 2014-07-03 MED ORDER — IBUPROFEN 200 MG PO TABS
600.0000 mg | ORAL_TABLET | Freq: Once | ORAL | Status: AC
  Administered 2014-07-03: 600 mg via ORAL
  Filled 2014-07-03: qty 3

## 2014-07-03 NOTE — Discharge Instructions (Signed)
Dental Fracture You have a dental fracture or injury. This can mean the tooth is loose, has a chip in the enamel or is broken. If just the outer enamel is chipped, there is a good chance the tooth will not become infected. The only treatment needed may be to smooth off a rough edge. Fractures into the deeper layers (dentin and pulp) cause greater pain and are more likely to become infected. These require you to see a dentist as soon as possible to save the tooth. Loose teeth may need to be wired or bonded with a plastic splint to hold them in place. A paste may be painted on the open area of the broken tooth to reduce the pain. Antibiotics and pain medicine may be prescribed. Choosing a soft or liquid diet and rinsing the mouth out with warm water after meals may be helpful. See your dentist as recommended. Failure to seek care or follow up with a dentist or other specialist as recommended could result in the loss of your tooth, infection, or permanent dental problems. SEEK MEDICAL CARE IF:   You have increased pain not controlled with medicines.  You have swelling around the tooth, in the face or neck.  You have bleeding which starts, continues, or gets worse.  You have a fever. Document Released: 10/24/2004 Document Revised: 12/09/2011 Document Reviewed: 08/08/2009 [facility] Patient Information 2015 [place], [place]. This information is not intended to replace advice given to you by your health care provider. Make sure you discuss any questions you have with your health care provider.    RESOURCE GUIDE  Chronic Pain Problems: Contact [facility]  [phone] Patients need to be referred by their primary care doctor.  Insufficient Money for Medicine: Contact [employer] Way:  call [phone]  No Primary Care Doctor: - Call Health Connect  [phone] - can help you locate a primary care doctor that  accepts your insurance, provides certain services, etc. - Physician Referral  Service- [phone]  Agencies that provide inexpensive medical care: - [facility]  [phone] - [facility]  [phone] - [facility]  [phone] - [facility] Clinic  [phone] - Planned Parenthood  [phone] [facility]  [phone]  Medicaid-accepting [facility] Providers: - [facility]- [address], [address]  [phone], Mon-Fri 9am-7pm, Sat 9am-1pm - [facility]- [address] [place], [address] [place]  [phone] - [facility]- [address], [address] [place]  [phone] [facility]- [address]  [phone] - Renaye Rakers- [address] [place], [address], [phone]  Only accepts [place] patients after they have their name  applied to their card  Self Pay (no insurance) in [place]: - Sickle Cell Patients - [facility]  [address] [place], [phone] - [facility]- [address] [place]  [phone]       [facility] [place]- [address], [address]       -     [facility]- see information above (Speak to [employer] if you do not have insurance)       -  [facility]- [address],  [phone]       -  [facility]- [address], [phone]       -  Dr Julio Sicks-  [phone],  [address], [place], [phone]       -  [facility] - [address], [phone]       -  [facility] [place]- [address], [phone], also [address], [phone]       -     [facility]- [address], [phone], 1st & 3rd Saturday         every month, 10am-1pm  -     [facility]   [address], [place].   Phone:  [phone], Fax:  [phone]. Hours of Operation:  9 am - 6 pm, M-F.  -     [facility]   [address], [address], [place]   Phone:  [phone], Fax: [phone]. Hours of Operation:  8:30 am - 5:30 pm, M-F.    Dental Assistance If unable to pay or uninsured, contact:  [facility] to become qualified for the adult dental clinic.  Patients with Medicaid: [facility] [phone], [phone], [phone]  If unable to pay, or uninsured, contact [facility] [phone] in [place], [phone] in [place]) to become qualified for the adult dental clinic  [facility] [address] [place], [place] [address] [phone] [email or web address]  Other [facility] Community Dental Services: - [facility]- [address], [place], [place], [address], [phone], [address]. [phone], 2nd and 4th Thursday of the month at 6:30am.  10 clients each day by appointment, can sometimes see walk-in patients if someone does not show for an appointment. [facility]- [address], [place], [place], [address], [phone] - [facility]- [address], [place], [place], [address], [phone] - [facility]- [phone] [facility]- [phone] [facility]- [facility]- [phone]

## 2014-07-03 NOTE — ED Notes (Signed)
Dr. Ward is at the bedside.  

## 2014-07-03 NOTE — ED Notes (Signed)
The pt is c/o a toothache for 3 days 

## 2014-07-03 NOTE — ED Provider Notes (Signed)
TIME SEEN: 6:50 AM  CHIEF COMPLAINT: Dental pain  HPI: Patient is a 43 y.o. F with history of hypertrophic cardiomyopathy status post defibrillator placement, DVT, atrial fibrillation who presents to the emergency department with complaints of a fractured right upper molar that has been present for 3 days it is painful. No gum swelling, erythema or drainage. No fevers, chills, nausea, vomiting or diarrhea. No chest pain or shortness of breath. She does not have a dentist.  ROS: See HPI Constitutional: no fever  Eyes: no drainage  ENT: no runny nose   Cardiovascular:  no chest pain  Resp: no SOB  GI: no vomiting GU: no dysuria Integumentary: no rash  Allergy: no hives  Musculoskeletal: no leg swelling  Neurological: no slurred speech ROS otherwise negative  PAST MEDICAL HISTORY/PAST SURGICAL HISTORY:  Past Medical History  Diagnosis Date  . Hypertrophic cardiomyopathy     s/p septal myomectomy with implantable defibrillator 01/27/2012   . Anxiety state, unspecified   . Allergic rhinitis     to dogs  . Asthma   . Anemia     during pregnancy  . History of chicken pox   . Generalized headaches   . Ocular migraine     no HA  . Back pain     told cracked bone, vicodin didn't help, improved with percocets/muscle relaxants, no eval by prior PCP  . 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 DVT (deep venous thrombosis) 01/2012    was placed on Warfarin after cardiac surgery but had skin reaction to warfarin so was changed to Pradaxa so DVT occurred while on Pradaxa although unclear if it occurred during transition  . Hypotension   . Atrial fibrillation     a. On pradaxa.  coumadin necrosis with warfarin;  b. Amio d/c'd 02/2012  . Pericardial effusion   . Chronic combined systolic and diastolic CHF (congestive heart failure)     a. echo 03/19/12: severe asymmetric septal hypertrophy, evidence of upper septal myomectomy, peak LV outflow  tract gradient 35, EF 35%,;  b.  Echo 9/13: Severe LVH, EF 30-35%, anteroseptal and inferior HK, LVOT narrow, mild AI, mild to moderate mitral stenosis, severe LAE  . Pericardial tamponade 03/14/2012  . 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 01/2012 s/p BSX ICD;  b. 06/2012 Multaq initiated due to recurrent syncope, VT/VF - had severe n/v with IV amio.  . Skin necrosis --COUMADIN   . Chronic chest wall pain     eval by CVTS, stable, [facility]  . Syncope     a. in setting of VT/VF.  Marland Kitchen. Hypothyroid   . Blood transfusion without reported diagnosis   . Neuromuscular disorder   . Gall stones   . Atrial flutter 10/2013    with inappropriate ICD therapy; s/p CTI ablation by Dr Ladona Ridgelaylor 11/16/2013  . Automatic implantable cardioverter-defibrillator in situ   . Dysrhythmia   . Pacemaker   . Complication of anesthesia   . PONV (postoperative nausea and vomiting)   . Depression   . Post-traumatic stress   . Fibromyalgia     MEDICATIONS:  Prior to Admission medications   Medication Sig Start Date End Date Taking? Authorizing Provider  acetaminophen (TYLENOL) 500 MG tablet Take 500-1,000 mg by mouth 2 (two) times daily as needed for mild pain, moderate pain, fever or headache (pain).     Historical Provider, MD  albuterol (  PROVENTIL HFA;VENTOLIN HFA) 108 (90 BASE) MCG/ACT inhaler Inhale 1 puff into the lungs every 4 (four) hours as needed for wheezing or shortness of breath.     Historical Provider, MD  ALPRAZolam Prudy Feeler) 1 MG tablet TAKE ONE TABLET BY MOUTH TWICE DAILY AS NEEDED 06/08/14   Eustaquio Boyden, MD  amoxicillin (AMOXIL) 500 MG capsule 4 tablets by mouth 1 hour prior to procedure 06/27/14   Sherlene Shams, MD  apixaban (ELIQUIS) 5 MG TABS tablet Take 1 tablet (5 mg total) by mouth 2 (two) times daily. 05/16/14   Marinus Maw, MD  beclomethasone (QVAR) 80 MCG/ACT inhaler Inhale 1 puff into the lungs 2 (two) times daily.    Historical Provider, MD  cetirizine  (ZYRTEC) 10 MG tablet Take 10 mg by mouth daily.     Historical Provider, MD  docusate sodium (COLACE) 100 MG capsule Take 100 mg by mouth 2 (two) times daily as needed for mild constipation.    Historical Provider, MD  furosemide (LASIX) 40 MG tablet Take 40 mg by mouth daily. OK to take extra lasix if needed take along with Extra potassium 12/10/13   Tonny Bollman, MD  gabapentin (NEURONTIN) 300 MG capsule Take 2 capsules (600 mg total) by mouth 3 (three) times daily. 05/10/14   Adam Gus Rankin, DO  levothyroxine (SYNTHROID, LEVOTHROID) 50 MCG tablet Take 1 tablet (50 mcg total) by mouth daily before breakfast. 04/21/14 04/21/15  Eustaquio Boyden, MD  magnesium gluconate (MAGONATE) 500 MG tablet Take 250 mg by mouth 2 (two) times daily.    Historical Provider, MD  metoprolol succinate (TOPROL-XL) 25 MG 24 hr tablet Take 1 tablet (25 mg total) by mouth 2 (two) times daily. 05/16/14 05/16/15  Marinus Maw, MD  mexiletine (MEXITIL) 150 MG capsule Take 2 capsules (300 mg total) by mouth 3 (three) times daily. 10/12/13 10/12/14  Marinus Maw, MD  ondansetron (ZOFRAN) 4 MG tablet Take 1 tablet (4 mg total) by mouth 3 (three) times daily as needed. 03/04/14   Eustaquio Boyden, MD  oxyCODONE-acetaminophen (PERCOCET/ROXICET) 5-325 MG per tablet Take 1-2 tablets by mouth every 4 (four) hours as needed for moderate pain. 01/28/14   Manus Rudd, MD  polyethylene glycol powder (GLYCOLAX/MIRALAX) powder Take 17 g by mouth daily as needed (constipation).  10/01/13   Historical Provider, MD  Potassium Gluconate 595 MG CAPS Take 595 mg by mouth daily. OK to take extra on days you need extra lasix    Historical Provider, MD  Probiotic Product (PROBIOTIC PO) Take 1 tablet by mouth daily.     Historical Provider, MD  ranolazine (RANEXA) 1000 MG SR tablet Take 1 tablet (1,000 mg total) by mouth 2 (two) times daily. 12/13/13 12/13/14  Tonny Bollman, MD    ALLERGIES:  Allergies  Allergen Reactions  . Phenergan [Promethazine  Hcl] Itching  . Warfarin And Related Other (See Comments)    'burns my skin'  . Amiodarone Nausea And Vomiting    Amiodarone tablets cause nausea Can tolerate IV Amiodarone  . Nitroglycerin Other (See Comments)    Hypotension    SOCIAL HISTORY:  History  Substance Use Topics  . Smoking status: Former Smoker -- 1.00 packs/day for 3 years    Types: Cigarettes    Quit date: 09/30/1988  . Smokeless tobacco: Never Used     Comment: quitin 1990  . Alcohol Use: No     Comment: none since "months" as of 02/28/2013    FAMILY HISTORY: Family History  Problem  Relation Age of Onset  . Diabetes Father   . Heart disease Father     unspecified heart problem  . Thyroid disease Mother   . Anemia Mother     mediterranean anemia, ITP  . Heart disease Maternal Grandfather     CHF  . Hyperlipidemia Maternal Grandfather   . Diabetes Paternal Grandfather   . Cancer Paternal Grandfather   . Coronary artery disease Neg Hx   . Stroke Neg Hx   . Sudden death Neg Hx     EXAM: BP 114/69  Pulse 78  Temp(Src) 98.3 F (36.8 C) (Oral)  Resp 18  Ht 5' (1.524 m)  Wt 206 lb (93.441 kg)  BMI 40.23 kg/m2  SpO2 99% CONSTITUTIONAL: Alert and oriented and responds appropriately to questions. Well-appearing; well-nourished HEAD: Normocephalic EYES: Conjunctivae clear, PERRL ENT: normal nose; no rhinorrhea; moist mucous membranes; pharynx without lesions noted; fractured right upper first molar without any dentin exposure, no sign of abscess, gums appear normal NECK: Supple, no meningismus, no LAD  CARD: RRR; S1 and S2 appreciated; no murmurs, no clicks, no rubs, no gallops RESP: Normal chest excursion without splinting or tachypnea; breath sounds clear and equal bilaterally; no wheezes, no rhonchi, no rales,  ABD/GI: Normal bowel sounds; non-distended; soft, non-tender, no rebound, no guarding BACK:  The back appears normal and is non-tender to palpation, there is no CVA tenderness EXT: Normal ROM in  all joints; non-tender to palpation; no edema; normal capillary refill; no cyanosis    SKIN: Normal color for age and race; warm NEURO: Moves all extremities equally PSYCH: The patient's mood and manner are appropriate. Grooming and personal hygiene are appropriate.  MEDICAL DECISION MAKING: Patient here with dental caries, tooth fracture. No dentin exposure. Will discharge with pain medication, antibiotics for prophylaxis and dental followup information. No sign of abscess on exam. Discussed return precautions. She verbalizes understanding and is comfortable with plan.       Layla Maw Desmen Schoffstall, DO 07/03/14 [phone]

## 2014-07-06 ENCOUNTER — Telehealth: Payer: Self-pay | Admitting: Internal Medicine

## 2014-07-06 NOTE — Telephone Encounter (Signed)
Spoke with patient.  Last VT episode 01/18/14.   6 months driving restrictions will end 07/20/14, patient aware.

## 2014-07-06 NOTE — Telephone Encounter (Signed)
New message    patient calling has questions regarding driving restriction.

## 2014-07-07 ENCOUNTER — Other Ambulatory Visit: Payer: Self-pay | Admitting: Neurology

## 2014-07-07 ENCOUNTER — Other Ambulatory Visit: Payer: Self-pay | Admitting: Family Medicine

## 2014-07-07 NOTE — Telephone Encounter (Signed)
Gabapentin refill requested. Per last office note- patient to remain on medication. Refill approved and sent to patient's pharmacy.   

## 2014-07-07 NOTE — Telephone Encounter (Signed)
Ok to refill 

## 2014-07-07 NOTE — Telephone Encounter (Signed)
plz phone in. 

## 2014-07-08 NOTE — Telephone Encounter (Signed)
Rx called in as directed.   

## 2014-07-10 ENCOUNTER — Other Ambulatory Visit: Payer: Self-pay | Admitting: Internal Medicine

## 2014-07-11 ENCOUNTER — Ambulatory Visit (INDEPENDENT_AMBULATORY_CARE_PROVIDER_SITE_OTHER): Payer: BC Managed Care – PPO | Admitting: Neurology

## 2014-07-11 DIAGNOSIS — R202 Paresthesia of skin: Secondary | ICD-10-CM

## 2014-07-11 DIAGNOSIS — R200 Anesthesia of skin: Secondary | ICD-10-CM

## 2014-07-11 NOTE — Procedures (Signed)
[facility]  [address], [address]  [place], [place] [address] Tel: [phone] Fax:  [phone] Test Date:  07/11/2014  Patient: Karen DienerCindy [name] DOB: 03/27/1971 Physician: Nita Sickleonika Patel, DO  Sex: Female Height: 5' " Ref Phys: Shon MilletJaffe, Adam  ID#: [id number] Temp: 35.0C Technician: Ala BentSusan Reid R. NCS T.   Patient Complaints: Patient is a 43 year old female here for evaluation of bilateral tingling in hands and feet.  NCV & EMG Findings: Extensive electrodiagnostic testing of the left upper and lower extremities reveals: 1. Median, ulnar, radial, and palmar sensory responses are within normal limits. 2. Median and ulnar motor responses are within normal limits. 3. Sural and superficial peroneal sensory responses are within normal limits. 4. The peroneal motor response recording at the extensor digitorum brevis is reduced, however it is within normal at the tibialis anterior. The tibial motor responses within normal limits. 5. Needle electrode examination shows no evidence of chronic active motor axon loss changes. Motor unit configuration and recruitment pattern is normal.   Impression: This is a normal study of the left upper and lower extremities.   In particular, there is no evidence of a cervical/lumbosacral radiculopathy, generalized sensorimotor polyneuropathy, or carpal tunnel syndrome affecting the left side.   ___________________________ Nita Sickleonika Patel, DO    Nerve Conduction Studies Anti Sensory Summary Table   Site NR Peak (ms) Norm Peak (ms) P-T Amp (V) Norm P-T Amp  Left Median Anti Sensory (2nd Digit)  Wrist    2.7 <3.4 32.4 >20  Left Radial Anti Sensory (Base 1st Digit)  Wrist    2.2 <2.7 24.7 >18  Left Sup Peroneal Anti Sensory (Ant Lat Mall)  12 cm    2.8 <4.5 10.2 >5  Left Sural Anti Sensory (Lat Mall)  Calf    2.6 <4.5 7.1 >5  Left Ulnar Anti Sensory (5th Digit)  Wrist    2.5 <3.1 25.5 >12   Motor Summary Table   Site NR Onset (ms) Norm  Onset (ms) O-P Amp (mV) Norm O-P Amp Site1 Site2 Delta-0 (ms) Dist (cm) Vel (m/s) Norm Vel (m/s)  Left Median Motor (Abd Poll Brev)  Wrist    2.5 <3.9 6.6 >6 Elbow Wrist 4.7 25.0 53 >50  Elbow    7.2  6.4         Left Peroneal Motor (Ext Dig Brev)  Ankle    3.0 <5.5 1.4 >3 B Fib Ankle 6.2 30.0 48 >40  B Fib    9.2  1.3  Poplt B Fib 1.5 8.0 53 >40  Poplt    10.7  1.3         Post-exercise    2.7  1.6         Left Peroneal TA Motor (Tib Ant)  Fib Head    2.3 <4.0 4.8 >4 Poplit Fib Head 1.4 7.5 54 >40  Poplit    3.7  4.7         Left Tibial Motor (Abd Hall Brev)    body habitus behind knee  Ankle    3.7 <6.0 8.7 >8 Knee Ankle 6.7 32.0 48 >40  Knee    10.4  4.5         Left Ulnar Motor (Abd Dig Minimi)  Wrist    2.3 <3.1 7.4 >7 B Elbow Wrist 3.4 21.0 62 >50  B Elbow    5.7  7.2  A Elbow B Elbow 1.7 10.0 59 >50  A Elbow    7.4  7.1          Comparison Summary Table   Site NR Peak (ms) Norm Peak (ms) P-T Amp (V) Site1 Site2 Delta-P (ms) Norm Delta (ms)  Left Median/Ulnar Palm Comparison (Wrist - 8cm)  Median Palm    1.6 <2.2 65.2 Median Palm Ulnar Palm 0.0   Ulnar Palm    1.6 <2.2 17.7       EMG   Side Muscle Ins Act Fibs Psw Fasc Number Recrt Dur Dur. Amp Amp. Poly Poly. Comment  Left AntTibialis Nml Nml Nml Nml Nml Nml Nml Nml Nml Nml Nml Nml N/A  Left Gastroc Nml Nml Nml Nml Nml Nml Nml Nml Nml Nml Nml Nml N/A  Left Flex Dig Long Nml Nml Nml Nml Nml Nml Nml Nml Nml Nml Nml Nml N/A  Left RectFemoris Nml Nml Nml Nml Nml Nml Nml Nml Nml Nml Nml Nml N/A  Left BicepsFemS Nml Nml Nml Nml Nml Nml Nml Nml Nml Nml Nml Nml N/A  Left 1stDorInt Nml Nml Nml Nml Nml Nml Nml Nml Nml Nml Nml Nml N/A  Left Ext Indicis Nml Nml Nml Nml Nml Nml Nml Nml Nml Nml Nml Nml N/A  Left Biceps Nml Nml Nml Nml Nml Nml Nml Nml Nml Nml Nml Nml N/A  Left Triceps Nml Nml Nml Nml Nml Nml Nml Nml Nml Nml Nml Nml N/A  Left Deltoid Nml Nml Nml Nml Nml Nml Nml Nml Nml Nml Nml Nml N/A      Waveforms:

## 2014-07-12 ENCOUNTER — Telehealth: Payer: Self-pay | Admitting: Cardiology

## 2014-07-12 NOTE — Telephone Encounter (Signed)
Pt called and stated that she felt rapid heart beats and sent a manual transmission. I reviewed transmission and after speaking with a device tech informed pt that the transmission was normal.

## 2014-07-17 ENCOUNTER — Other Ambulatory Visit: Payer: Self-pay | Admitting: Internal Medicine

## 2014-07-17 ENCOUNTER — Other Ambulatory Visit: Payer: Self-pay | Admitting: Cardiovascular Disease

## 2014-07-21 ENCOUNTER — Telehealth: Payer: Self-pay | Admitting: Internal Medicine

## 2014-07-21 NOTE — Telephone Encounter (Signed)
Discussed with Dr Ladona Ridgelaylor okay to try rehab.  Spoke with patient she is aware.  I have left  Her a message.

## 2014-07-21 NOTE — Telephone Encounter (Signed)
New message           Pt would like know if she is able to start cardiac rehab now

## 2014-07-22 ENCOUNTER — Telehealth: Payer: Self-pay | Admitting: Internal Medicine

## 2014-07-22 ENCOUNTER — Telehealth (HOSPITAL_COMMUNITY): Payer: Self-pay | Admitting: *Deleted

## 2014-07-22 NOTE — Telephone Encounter (Signed)
New message           Pt wants to know if she can start cardiac rehab and if she needs a referral from us

## 2014-07-22 NOTE — Telephone Encounter (Signed)
  Spoke with patient and she can try and restart cardiac rehab per Dr Ladona Ridgelaylor.  I have asked that she call over to the rehab department and talk to them

## 2014-07-22 NOTE — Telephone Encounter (Signed)
Return call - message left.  Pt is interested in participating in cardiac rehab.  Order faxed to dr. Ladona Ridgelaylor per pt request to sign.  Once signed order received back will re call pt and schedule orientation and cardiac rehab exercise sessions.  Pt verbalized understanding.  Alanson Alyarlette Emmer Lillibridge RN, BSN

## 2014-07-25 ENCOUNTER — Telehealth: Payer: Self-pay | Admitting: Cardiovascular Disease

## 2014-07-25 NOTE — Telephone Encounter (Signed)
Pt aware [employer] paperwork Ready For Pick Up.

## 2014-07-25 NOTE — Telephone Encounter (Signed)
Paper work given to medical records.  Selena BattenKim will contact the pt.

## 2014-07-25 NOTE — Telephone Encounter (Signed)
Pt Aware Dr.Cooper Signed And Completed [employer] papers and Their ready For Pick up 10.26.15/km

## 2014-07-25 NOTE — Telephone Encounter (Signed)
New message  Pt called states that she dropped off [employer] form About 3 weeks ago.Marland Kitchen. Request a call back to discuss when she should come to pick it up

## 2014-08-04 ENCOUNTER — Encounter (HOSPITAL_COMMUNITY)
Admission: RE | Admit: 2014-08-04 | Discharge: 2014-08-04 | Disposition: A | Discharge status: Home / Self Care | Payer: BC Managed Care – PPO | Source: Ambulatory Visit | Attending: Internal Medicine | Admitting: Internal Medicine

## 2014-08-04 ENCOUNTER — Other Ambulatory Visit: Payer: Self-pay | Admitting: Family Medicine

## 2014-08-04 DIAGNOSIS — Z5189 Encounter for other specified aftercare: Secondary | ICD-10-CM | POA: Insufficient documentation

## 2014-08-04 DIAGNOSIS — E118 Type 2 diabetes mellitus with unspecified complications: Secondary | ICD-10-CM | POA: Insufficient documentation

## 2014-08-04 DIAGNOSIS — Z8673 Personal history of transient ischemic attack (TIA), and cerebral infarction without residual deficits: Secondary | ICD-10-CM | POA: Insufficient documentation

## 2014-08-04 DIAGNOSIS — Z95810 Presence of automatic (implantable) cardiac defibrillator: Secondary | ICD-10-CM | POA: Insufficient documentation

## 2014-08-04 DIAGNOSIS — E669 Obesity, unspecified: Secondary | ICD-10-CM | POA: Insufficient documentation

## 2014-08-04 DIAGNOSIS — I509 Heart failure, unspecified: Secondary | ICD-10-CM | POA: Insufficient documentation

## 2014-08-04 DIAGNOSIS — J449 Chronic obstructive pulmonary disease, unspecified: Secondary | ICD-10-CM | POA: Insufficient documentation

## 2014-08-04 DIAGNOSIS — J45909 Unspecified asthma, uncomplicated: Secondary | ICD-10-CM | POA: Insufficient documentation

## 2014-08-04 DIAGNOSIS — I10 Essential (primary) hypertension: Secondary | ICD-10-CM | POA: Insufficient documentation

## 2014-08-04 NOTE — Telephone Encounter (Signed)
plz phone in. 

## 2014-08-04 NOTE — Progress Notes (Signed)
Cardiac Rehab Medication Review by a Pharmacist  Does the patient  feel that his/her medications are working for him/her?  yes  Has the patient been experiencing any side effects to the medications prescribed?  yes  Does the patient measure his/her own blood pressure or blood glucose at home?  yes   Does the patient have any problems obtaining medications due to transportation or finances?   no  Understanding of regimen: good Understanding of indications: good Potential of compliance: excellent    Pharmacist comments: Reports possible dizziness/off balance from the mexilitine.Pt has good understanding of med regimen and reports strict compliance using a pill box.  Waynette Butteryegan K. Ondra Deboard, PharmD Clinical Pharmacy Resident Pager: [phone] 08/04/2014 8:17 AM

## 2014-08-05 NOTE — Telephone Encounter (Signed)
Rx called in as directed.   

## 2014-08-08 ENCOUNTER — Other Ambulatory Visit: Payer: Self-pay | Admitting: Cardiology

## 2014-08-08 ENCOUNTER — Ambulatory Visit (INDEPENDENT_AMBULATORY_CARE_PROVIDER_SITE_OTHER): Payer: BC Managed Care – PPO | Admitting: *Deleted

## 2014-08-08 ENCOUNTER — Other Ambulatory Visit: Payer: Self-pay | Admitting: Neurology

## 2014-08-08 ENCOUNTER — Encounter (HOSPITAL_COMMUNITY)
Admission: RE | Admit: 2014-08-08 | Discharge: 2014-08-08 | Disposition: A | Discharge status: Home / Self Care | Payer: BC Managed Care – PPO | Source: Ambulatory Visit | Attending: Internal Medicine | Admitting: Internal Medicine

## 2014-08-08 ENCOUNTER — Other Ambulatory Visit (INDEPENDENT_AMBULATORY_CARE_PROVIDER_SITE_OTHER): Payer: BC Managed Care – PPO | Admitting: *Deleted

## 2014-08-08 ENCOUNTER — Other Ambulatory Visit: Payer: Self-pay | Admitting: Family Medicine

## 2014-08-08 DIAGNOSIS — I10 Essential (primary) hypertension: Secondary | ICD-10-CM | POA: Diagnosis not present

## 2014-08-08 DIAGNOSIS — I442 Atrioventricular block, complete: Secondary | ICD-10-CM

## 2014-08-08 DIAGNOSIS — Z5189 Encounter for other specified aftercare: Secondary | ICD-10-CM | POA: Diagnosis not present

## 2014-08-08 DIAGNOSIS — I420 Dilated cardiomyopathy: Secondary | ICD-10-CM

## 2014-08-08 DIAGNOSIS — Z8673 Personal history of transient ischemic attack (TIA), and cerebral infarction without residual deficits: Secondary | ICD-10-CM | POA: Diagnosis not present

## 2014-08-08 DIAGNOSIS — Z95810 Presence of automatic (implantable) cardiac defibrillator: Secondary | ICD-10-CM | POA: Diagnosis not present

## 2014-08-08 DIAGNOSIS — J45909 Unspecified asthma, uncomplicated: Secondary | ICD-10-CM | POA: Diagnosis not present

## 2014-08-08 DIAGNOSIS — E118 Type 2 diabetes mellitus with unspecified complications: Secondary | ICD-10-CM | POA: Diagnosis not present

## 2014-08-08 DIAGNOSIS — J449 Chronic obstructive pulmonary disease, unspecified: Secondary | ICD-10-CM | POA: Diagnosis not present

## 2014-08-08 DIAGNOSIS — I429 Cardiomyopathy, unspecified: Secondary | ICD-10-CM

## 2014-08-08 DIAGNOSIS — I509 Heart failure, unspecified: Secondary | ICD-10-CM | POA: Diagnosis not present

## 2014-08-08 DIAGNOSIS — E669 Obesity, unspecified: Secondary | ICD-10-CM | POA: Diagnosis not present

## 2014-08-08 LAB — BASIC METABOLIC PANEL
BUN: 14 mg/dL (ref 6–23)
CALCIUM: 9 mg/dL (ref 8.4–10.5)
CO2: 25 mEq/L (ref 19–32)
CREATININE: 0.85 mg/dL (ref 0.50–1.10)
Chloride: 100 mEq/L (ref 96–112)
Glucose, Bld: 90 mg/dL (ref 70–99)
Potassium: 4.5 mEq/L (ref 3.5–5.3)
Sodium: 139 mEq/L (ref 135–145)

## 2014-08-08 LAB — MAGNESIUM: Magnesium: 1.9 mg/dL (ref 1.5–2.5)

## 2014-08-08 NOTE — Progress Notes (Signed)
Pt started cardiac rehab today.  Pt tolerated light exercise without difficulty. Telemetry rhythm A paced with a bundle branch block. Vital signs stable. Patient went into a wide complex rhythm during cool down, nonsustained Rate 70's. Patient reported feeling a "Patient reported feeling like her heart was pounding". Patient placed on Zoll rhythm A Paced with occasional PVC's.  [name] called and notified and reviewed ECG tracing.Patient to have ICD interrogated at [name] office and is to get a CBC and a BMET drawn at [name] office. [name]'s quality of life scores are low in all area's except socioeconomic. [name]'s PHQ score equal 13. [name] reports being depressed but denies being suicidal. [name] has an appointment with her primary care provider [name] tomorrow. Will forward quality of life questionnaire to patient's primary care provider for review. [name] says she was prescribed an antidepressant which she has not taken because of fear of cardiac side effects. The device clinic interrogated [name]'s pacemaker and did not see any events recorded. I spoke with [name]. Patient is okay to return to exercise on Wednesday. Patient called and made aware.

## 2014-08-08 NOTE — Progress Notes (Signed)
Cardiac rehab called and wanted device interrogated. No episodes recorded. Pt aware.

## 2014-08-09 ENCOUNTER — Ambulatory Visit (INDEPENDENT_AMBULATORY_CARE_PROVIDER_SITE_OTHER): Payer: BC Managed Care – PPO | Admitting: Family Medicine

## 2014-08-09 ENCOUNTER — Encounter: Payer: Self-pay | Admitting: Family Medicine

## 2014-08-09 VITALS — BP 116/72 | HR 80 | Temp 97.0°F | Wt 215.0 lb

## 2014-08-09 DIAGNOSIS — F418 Other specified anxiety disorders: Secondary | ICD-10-CM

## 2014-08-09 MED ORDER — SERTRALINE HCL 50 MG PO TABS: 50.0000 mg | ORAL_TABLET | Freq: Every day | ORAL | Status: DC

## 2014-08-09 NOTE — Patient Instructions (Addendum)
Look into local churches as a good way to establish with a support group.  Let's start sertraline 50mg  daily.  Start at 1/2 tablet daily for 4 days then increase to whole tablet. Call # provided to establish with counselor.  Good to see you today, hang in there!  Return in 1-2 months for follow up.

## 2014-08-09 NOTE — Progress Notes (Signed)
BP 116/72 mmHg  Pulse 80  Temp(Src) 97 F (36.1 C) (Oral)  Wt 215 lb (97.523 kg)  LMP 08/01/2014   CC: depression "I'm miserable" Subjective:    Patient ID: [name], female    DOB: 04/30/1971, 43 y.o.   MRN: [id number]  HPI: [name] is a 43 y.o. female presenting on 08/09/2014 for Joints are twitcing and Depression   Received notice from cardiac rehab RN regarding pt's worsening depression. Quality of life questionairre faxed over showing low scores across the board (asked to scan). Endorses several month h/o depression/anhedonia, appetite increased, some trouble sleeping, decreased energy, concentration down. Denies SI/HI.   Daughter nearby but she stays busy with her own life. Rest of family does not talk to her since 2012.  Feels bad 2/2 cardiac health conditions. Boyfriend has been drinking and smoking. Poor social support.   Relevant past medical, surgical, family and social history reviewed and updated as indicated.  Allergies and medications reviewed and updated. Current Outpatient Prescriptions on File Prior to Visit  Medication Sig  . acetaminophen (TYLENOL) 500 MG tablet Take 500-1,000 mg by mouth 2 (two) times daily as needed for mild pain, moderate pain, fever or headache (pain).   Marland Kitchen. albuterol (PROVENTIL HFA;VENTOLIN HFA) 108 (90 BASE) MCG/ACT inhaler Inhale 1 puff into the lungs every 4 (four) hours as needed for wheezing or shortness of breath.   . ALPRAZolam (XANAX) 1 MG tablet TAKE ONE TABLET BY MOUTH TWICE DAILY AS NEEDED  . apixaban (ELIQUIS) 5 MG TABS tablet Take 1 tablet (5 mg total) by mouth 2 (two) times daily.  . beclomethasone (QVAR) 80 MCG/ACT inhaler Inhale 1 puff into the lungs 2 (two) times daily.  . cetirizine (ZYRTEC) 10 MG tablet Take 10 mg by mouth daily.   Marland Kitchen. docusate sodium (COLACE) 100 MG capsule Take 100 mg by mouth 2 (two) times daily as needed for mild constipation.  . fexofenadine (ALLEGRA) 180 MG tablet Take 180 mg by mouth daily.   . furosemide (LASIX) 40 MG tablet TAKE ONE TABLET BY MOUTH ONCE DAILY  . gabapentin (NEURONTIN) 300 MG capsule TAKE TWO CAPSULES BY MOUTH THREE TIMES DAILY  . levothyroxine (SYNTHROID, LEVOTHROID) 50 MCG tablet TAKE ONE TABLET BY MOUTH ONCE DAILY BEFORE BREAKFAST  . magnesium gluconate (MAGONATE) 500 MG tablet Take 250 mg by mouth 2 (two) times daily.  . metoprolol succinate (TOPROL-XL) 25 MG 24 hr tablet Take 1 tablet (25 mg total) by mouth 2 (two) times daily.  Marland Kitchen. mexiletine (MEXITIL) 150 MG capsule TAKE TWO CAPSULES BY MOUTH THREE TIMES DAILY  . Olopatadine HCl (PATANASE) 0.6 % SOLN Place 1 puff into the nose 2 (two) times daily.  . ondansetron (ZOFRAN) 4 MG tablet Take 1 tablet (4 mg total) by mouth 3 (three) times daily as needed.  Marland Kitchen. oxyCODONE-acetaminophen (PERCOCET/ROXICET) 5-325 MG per tablet Take 1-2 tablets by mouth every 4 (four) hours as needed for moderate pain.  . polyethylene glycol powder (GLYCOLAX/MIRALAX) powder Take 17 g by mouth daily as needed (constipation).   . Potassium Gluconate 595 MG CAPS Take 595 mg by mouth daily. OK to take extra on days you need extra lasix  . ranolazine (RANEXA) 1000 MG SR tablet Take 1 tablet (1,000 mg total) by mouth 2 (two) times daily.   No current facility-administered medications on file prior to visit.   Past Medical History  Diagnosis Date  . Hypertrophic cardiomyopathy     s/p septal myomectomy with implantable defibrillator 01/27/2012   .  Anxiety state, unspecified   . Allergic rhinitis     to dogs  . Asthma   . Anemia     during pregnancy  . History of chicken pox   . Generalized headaches   . Ocular migraine     no HA  . Back pain     told cracked bone, vicodin didn't help, improved with percocets/muscle relaxants, no eval by prior PCP  . 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 DVT (deep venous thrombosis) 01/2012    was placed on Warfarin after cardiac surgery but had  skin reaction to warfarin so was changed to Pradaxa so DVT occurred while on Pradaxa although unclear if it occurred during transition  . Hypotension   . Atrial fibrillation     a. On pradaxa.  coumadin necrosis with warfarin;  b. Amio d/c'd 02/2012  . Pericardial effusion   . Chronic combined systolic and diastolic CHF (congestive heart failure)     a. echo 03/19/12: severe asymmetric septal hypertrophy, evidence of upper septal myomectomy, peak LV outflow tract gradient 35, EF 35%,;  b.  Echo 9/13: Severe LVH, EF 30-35%, anteroseptal and inferior HK, LVOT narrow, mild AI, mild to moderate mitral stenosis, severe LAE  . Pericardial tamponade 03/14/2012  . 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 01/2012 s/p BSX ICD;  b. 06/2012 Multaq initiated due to recurrent syncope, VT/VF - had severe n/v with IV amio.  . Skin necrosis --COUMADIN   . Chronic chest wall pain     eval by [facility], stable, [facility]  . Syncope     a. in setting of VT/VF.  Marland Kitchen. Hypothyroid   . Blood transfusion without reported diagnosis   . Neuromuscular disorder   . Gall stones   . Atrial flutter 10/2013    with inappropriate ICD therapy; s/p CTI ablation by Dr Ladona Ridgelaylor 11/16/2013  . Automatic implantable cardioverter-defibrillator in situ   . Dysrhythmia   . Pacemaker   . Complication of anesthesia   . PONV (postoperative nausea and vomiting)   . Depression   . Post-traumatic stress   . Fibromyalgia     Review of Systems Per HPI unless specifically indicated above    Objective:    BP 116/72 mmHg  Pulse 80  Temp(Src) 97 F (36.1 C) (Oral)  Wt 215 lb (97.523 kg)  LMP 08/01/2014  Physical Exam  Constitutional: She appears well-developed and well-nourished. No distress.  HENT:  Mouth/Throat: Oropharynx is clear and moist. No oropharyngeal exudate.  Cardiovascular: Normal rate, regular rhythm and intact distal pulses.   Murmur (chronic) heard. Pulmonary/Chest: Effort normal and breath  sounds normal. No respiratory distress. She has no wheezes. She has no rales.  Psychiatric: She has a normal mood and affect.  Nursing note and vitals reviewed.  Results for orders placed or performed in visit on 08/08/14  Basic Metabolic Panel (BMET)  Result Value Ref Range   Sodium 139 135 - 145 mEq/L   Potassium 4.5 3.5 - 5.3 mEq/L   Chloride 100 96 - 112 mEq/L   CO2 25 19 - 32 mEq/L   Glucose, Bld 90 70 - 99 mg/dL   BUN 14 6 - 23 mg/dL   Creat 1.610.85 0.960.50 - 0.451.10 mg/dL   Calcium 9.0 8.4 - 40.910.5 mg/dL  Magnesium  Result Value Ref Range   Magnesium 1.9 1.5 - 2.5 mg/dL  Assessment & Plan:   Problem List Items Addressed This Visit    Depression with anxiety - Primary    Meets criteria for MDD. Discussed depression dx with patient as well as treatment options. Discussed importance of social support - pt currently does not have this. May look into attending local church. Discussed pharmacotherapy and counseling - pt interested in both Start sertraline 25mg  for 4 days then increase to 50mg  daily, will also refer for psychotherapy Pt agrees with plan. F/u in 1-2 months    Relevant Orders      Ambulatory referral to Psychology       Follow up plan: Return in about 2 months (around 10/09/2014), or if symptoms worsen or fail to improve, for follow up visit.

## 2014-08-09 NOTE — Progress Notes (Signed)
Pre visit review using our clinic review tool, if applicable. No additional management support is needed unless otherwise documented below in the visit note. 

## 2014-08-09 NOTE — Assessment & Plan Note (Signed)
Meets criteria for MDD. Discussed depression dx with patient as well as treatment options. Discussed importance of social support - pt currently does not have this. May look into attending local church. Discussed pharmacotherapy and counseling - pt interested in both Start sertraline 25mg  for 4 days then increase to 50mg  daily, will also refer for psychotherapy Pt agrees with plan. F/u in 1-2 months

## 2014-08-10 ENCOUNTER — Encounter (HOSPITAL_COMMUNITY)
Admission: RE | Admit: 2014-08-10 | Discharge: 2014-08-10 | Disposition: A | Discharge status: Home / Self Care | Payer: BC Managed Care – PPO | Source: Ambulatory Visit | Attending: Internal Medicine | Admitting: Internal Medicine

## 2014-08-10 DIAGNOSIS — Z5189 Encounter for other specified aftercare: Secondary | ICD-10-CM | POA: Diagnosis not present

## 2014-08-12 ENCOUNTER — Encounter (HOSPITAL_COMMUNITY)
Admission: RE | Admit: 2014-08-12 | Discharge: 2014-08-12 | Disposition: A | Discharge status: Home / Self Care | Payer: BC Managed Care – PPO | Source: Ambulatory Visit | Attending: Internal Medicine | Admitting: Internal Medicine

## 2014-08-12 ENCOUNTER — Telehealth: Payer: Self-pay | Admitting: Internal Medicine

## 2014-08-12 DIAGNOSIS — Z5189 Encounter for other specified aftercare: Secondary | ICD-10-CM | POA: Diagnosis not present

## 2014-08-12 LAB — GLUCOSE, CAPILLARY: GLUCOSE-CAPILLARY: 126 mg/dL — AB (ref 70–99)

## 2014-08-12 NOTE — Telephone Encounter (Signed)
New problem   Pt want to know status of ins form she brought over.

## 2014-08-12 NOTE — Telephone Encounter (Signed)
Spoke with patient and let her know papers will be complete on Mon

## 2014-08-15 ENCOUNTER — Encounter (HOSPITAL_COMMUNITY)
Admission: RE | Admit: 2014-08-15 | Discharge: 2014-08-15 | Disposition: A | Discharge status: Home / Self Care | Payer: BC Managed Care – PPO | Source: Ambulatory Visit | Attending: Internal Medicine | Admitting: Internal Medicine

## 2014-08-15 DIAGNOSIS — Z5189 Encounter for other specified aftercare: Secondary | ICD-10-CM | POA: Diagnosis not present

## 2014-08-15 NOTE — Progress Notes (Signed)
Reviewed home exercise with pt today.  Pt plans to walk at home for exercise.  Reviewed THR, pulse, RPE, sign and symptoms, and when to call 911 or MD.  Pt voiced understanding. Afra Tricarico, MA, ACSM RCEP   

## 2014-08-16 ENCOUNTER — Ambulatory Visit: Payer: BC Managed Care – PPO | Admitting: *Deleted

## 2014-08-17 ENCOUNTER — Telehealth (HOSPITAL_COMMUNITY): Payer: Self-pay | Admitting: Family Medicine

## 2014-08-17 ENCOUNTER — Encounter: Payer: Self-pay | Admitting: Internal Medicine

## 2014-08-17 ENCOUNTER — Ambulatory Visit (INDEPENDENT_AMBULATORY_CARE_PROVIDER_SITE_OTHER): Payer: BC Managed Care – PPO | Admitting: *Deleted

## 2014-08-17 ENCOUNTER — Encounter (HOSPITAL_COMMUNITY): Payer: BC Managed Care – PPO

## 2014-08-17 DIAGNOSIS — I429 Cardiomyopathy, unspecified: Secondary | ICD-10-CM

## 2014-08-17 LAB — MDC_IDC_ENUM_SESS_TYPE_REMOTE
Battery Remaining Longevity: 78 mo
Battery Remaining Percentage: 90 %
Brady Statistic RA Percent Paced: 81 %
Date Time Interrogation Session: 20151118115100
HIGH POWER IMPEDANCE MEASURED VALUE: 54 Ohm
Implantable Pulse Generator Serial Number: 102591
Lead Channel Impedance Value: 515 Ohm
Lead Channel Impedance Value: 565 Ohm
Lead Channel Pacing Threshold Amplitude: 1.1 V
Lead Channel Pacing Threshold Pulse Width: 0.5 ms
Lead Channel Pacing Threshold Pulse Width: 0.5 ms
Lead Channel Setting Pacing Amplitude: 2.4 V
Lead Channel Setting Sensing Sensitivity: 0.6 mV
MDC IDC MSMT LEADCHNL RA PACING THRESHOLD AMPLITUDE: 0.8 V
MDC IDC MSMT LEADCHNL RV SENSING INTR AMPL: 20.6 mV
MDC IDC SET LEADCHNL RA PACING AMPLITUDE: 2 V
MDC IDC SET LEADCHNL RV PACING PULSEWIDTH: 0.5 ms
MDC IDC SET ZONE DETECTION INTERVAL: 273 ms
MDC IDC STAT BRADY RV PERCENT PACED: 0 %
Zone Setting Detection Interval: 333 ms

## 2014-08-17 NOTE — Progress Notes (Signed)
Remote ICD transmission.   

## 2014-08-19 ENCOUNTER — Encounter (HOSPITAL_COMMUNITY)
Admission: RE | Admit: 2014-08-19 | Discharge: 2014-08-19 | Disposition: A | Discharge status: Home / Self Care | Payer: BC Managed Care – PPO | Source: Ambulatory Visit | Attending: Internal Medicine | Admitting: Internal Medicine

## 2014-08-19 DIAGNOSIS — Z5189 Encounter for other specified aftercare: Secondary | ICD-10-CM | POA: Diagnosis not present

## 2014-08-22 ENCOUNTER — Encounter (HOSPITAL_COMMUNITY)
Admission: RE | Admit: 2014-08-22 | Discharge: 2014-08-22 | Disposition: A | Discharge status: Home / Self Care | Payer: BC Managed Care – PPO | Source: Ambulatory Visit | Attending: Internal Medicine | Admitting: Internal Medicine

## 2014-08-22 DIAGNOSIS — Z5189 Encounter for other specified aftercare: Secondary | ICD-10-CM | POA: Diagnosis not present

## 2014-08-24 ENCOUNTER — Encounter (HOSPITAL_COMMUNITY): Payer: BC Managed Care – PPO

## 2014-08-24 ENCOUNTER — Encounter: Payer: Self-pay | Admitting: Cardiology

## 2014-08-29 ENCOUNTER — Encounter (HOSPITAL_COMMUNITY)
Admission: RE | Admit: 2014-08-29 | Discharge: 2014-08-29 | Disposition: A | Discharge status: Home / Self Care | Payer: BC Managed Care – PPO | Source: Ambulatory Visit | Attending: Internal Medicine | Admitting: Internal Medicine

## 2014-08-29 DIAGNOSIS — Z5189 Encounter for other specified aftercare: Secondary | ICD-10-CM | POA: Diagnosis not present

## 2014-08-30 ENCOUNTER — Telehealth: Payer: Self-pay | Admitting: *Deleted

## 2014-08-30 NOTE — Telephone Encounter (Signed)
New Message  Pt called to resch remote visit; the appt in question was marked completed but wanted more information, please call back.

## 2014-08-30 NOTE — Telephone Encounter (Signed)
Transmission received patient aware. 

## 2014-08-31 ENCOUNTER — Telehealth: Payer: Self-pay | Admitting: *Deleted

## 2014-08-31 ENCOUNTER — Telehealth: Payer: Self-pay | Admitting: Cardiology

## 2014-08-31 ENCOUNTER — Emergency Department (HOSPITAL_COMMUNITY): Payer: BC Managed Care – PPO

## 2014-08-31 ENCOUNTER — Other Ambulatory Visit (INDEPENDENT_AMBULATORY_CARE_PROVIDER_SITE_OTHER): Payer: BC Managed Care – PPO | Admitting: *Deleted

## 2014-08-31 ENCOUNTER — Encounter (HOSPITAL_COMMUNITY)
Admission: RE | Admit: 2014-08-31 | Discharge: 2014-08-31 | Disposition: A | Discharge status: Home / Self Care | Payer: BC Managed Care – PPO | Source: Ambulatory Visit | Attending: Internal Medicine | Admitting: Internal Medicine

## 2014-08-31 ENCOUNTER — Observation Stay (HOSPITAL_COMMUNITY)
Admission: EM | Admit: 2014-08-31 | Discharge: 2014-09-02 | Disposition: A | Discharge status: Home / Self Care | Payer: BC Managed Care – PPO | Attending: Internal Medicine | Admitting: Internal Medicine

## 2014-08-31 ENCOUNTER — Other Ambulatory Visit: Payer: Self-pay | Admitting: Cardiology

## 2014-08-31 ENCOUNTER — Encounter (HOSPITAL_COMMUNITY): Payer: Self-pay | Admitting: Emergency Medicine

## 2014-08-31 ENCOUNTER — Other Ambulatory Visit: Payer: Self-pay

## 2014-08-31 ENCOUNTER — Encounter: Payer: Self-pay | Admitting: Internal Medicine

## 2014-08-31 DIAGNOSIS — G8929 Other chronic pain: Secondary | ICD-10-CM | POA: Insufficient documentation

## 2014-08-31 DIAGNOSIS — J449 Chronic obstructive pulmonary disease, unspecified: Secondary | ICD-10-CM | POA: Insufficient documentation

## 2014-08-31 DIAGNOSIS — R000 Tachycardia, unspecified: Secondary | ICD-10-CM | POA: Diagnosis present

## 2014-08-31 DIAGNOSIS — I5042 Chronic combined systolic (congestive) and diastolic (congestive) heart failure: Secondary | ICD-10-CM | POA: Insufficient documentation

## 2014-08-31 DIAGNOSIS — E039 Hypothyroidism, unspecified: Secondary | ICD-10-CM | POA: Diagnosis not present

## 2014-08-31 DIAGNOSIS — G43809 Other migraine, not intractable, without status migrainosus: Secondary | ICD-10-CM | POA: Diagnosis not present

## 2014-08-31 DIAGNOSIS — Z8619 Personal history of other infectious and parasitic diseases: Secondary | ICD-10-CM | POA: Diagnosis not present

## 2014-08-31 DIAGNOSIS — F329 Major depressive disorder, single episode, unspecified: Secondary | ICD-10-CM | POA: Insufficient documentation

## 2014-08-31 DIAGNOSIS — Z7951 Long term (current) use of inhaled steroids: Secondary | ICD-10-CM | POA: Diagnosis not present

## 2014-08-31 DIAGNOSIS — J45909 Unspecified asthma, uncomplicated: Secondary | ICD-10-CM | POA: Insufficient documentation

## 2014-08-31 DIAGNOSIS — F4310 Post-traumatic stress disorder, unspecified: Secondary | ICD-10-CM | POA: Insufficient documentation

## 2014-08-31 DIAGNOSIS — K8020 Calculus of gallbladder without cholecystitis without obstruction: Secondary | ICD-10-CM | POA: Diagnosis not present

## 2014-08-31 DIAGNOSIS — Z4502 Encounter for adjustment and management of automatic implantable cardiac defibrillator: Secondary | ICD-10-CM

## 2014-08-31 DIAGNOSIS — I422 Other hypertrophic cardiomyopathy: Secondary | ICD-10-CM | POA: Diagnosis not present

## 2014-08-31 DIAGNOSIS — I4891 Unspecified atrial fibrillation: Secondary | ICD-10-CM | POA: Diagnosis not present

## 2014-08-31 DIAGNOSIS — Z86718 Personal history of other venous thrombosis and embolism: Secondary | ICD-10-CM | POA: Insufficient documentation

## 2014-08-31 DIAGNOSIS — F419 Anxiety disorder, unspecified: Secondary | ICD-10-CM | POA: Insufficient documentation

## 2014-08-31 DIAGNOSIS — Z872 Personal history of diseases of the skin and subcutaneous tissue: Secondary | ICD-10-CM | POA: Insufficient documentation

## 2014-08-31 DIAGNOSIS — I471 Supraventricular tachycardia: Secondary | ICD-10-CM

## 2014-08-31 DIAGNOSIS — I10 Essential (primary) hypertension: Secondary | ICD-10-CM | POA: Insufficient documentation

## 2014-08-31 DIAGNOSIS — Z95810 Presence of automatic (implantable) cardiac defibrillator: Secondary | ICD-10-CM | POA: Insufficient documentation

## 2014-08-31 DIAGNOSIS — I959 Hypotension, unspecified: Secondary | ICD-10-CM | POA: Diagnosis not present

## 2014-08-31 DIAGNOSIS — Z792 Long term (current) use of antibiotics: Secondary | ICD-10-CM | POA: Insufficient documentation

## 2014-08-31 DIAGNOSIS — I4892 Unspecified atrial flutter: Secondary | ICD-10-CM | POA: Insufficient documentation

## 2014-08-31 DIAGNOSIS — Z7901 Long term (current) use of anticoagulants: Secondary | ICD-10-CM

## 2014-08-31 DIAGNOSIS — Z79899 Other long term (current) drug therapy: Secondary | ICD-10-CM | POA: Diagnosis not present

## 2014-08-31 DIAGNOSIS — Z8673 Personal history of transient ischemic attack (TIA), and cerebral infarction without residual deficits: Secondary | ICD-10-CM | POA: Insufficient documentation

## 2014-08-31 DIAGNOSIS — I509 Heart failure, unspecified: Secondary | ICD-10-CM | POA: Insufficient documentation

## 2014-08-31 DIAGNOSIS — E118 Type 2 diabetes mellitus with unspecified complications: Secondary | ICD-10-CM | POA: Insufficient documentation

## 2014-08-31 DIAGNOSIS — Z0389 Encounter for observation for other suspected diseases and conditions ruled out: Secondary | ICD-10-CM

## 2014-08-31 DIAGNOSIS — F418 Other specified anxiety disorders: Secondary | ICD-10-CM | POA: Diagnosis present

## 2014-08-31 DIAGNOSIS — I319 Disease of pericardium, unspecified: Secondary | ICD-10-CM | POA: Diagnosis not present

## 2014-08-31 DIAGNOSIS — I472 Ventricular tachycardia, unspecified: Secondary | ICD-10-CM

## 2014-08-31 DIAGNOSIS — G709 Myoneural disorder, unspecified: Secondary | ICD-10-CM | POA: Diagnosis not present

## 2014-08-31 DIAGNOSIS — Z87891 Personal history of nicotine dependence: Secondary | ICD-10-CM | POA: Diagnosis not present

## 2014-08-31 DIAGNOSIS — Z5189 Encounter for other specified aftercare: Secondary | ICD-10-CM | POA: Insufficient documentation

## 2014-08-31 DIAGNOSIS — D649 Anemia, unspecified: Secondary | ICD-10-CM | POA: Insufficient documentation

## 2014-08-31 DIAGNOSIS — E669 Obesity, unspecified: Secondary | ICD-10-CM | POA: Insufficient documentation

## 2014-08-31 LAB — BASIC METABOLIC PANEL
Anion gap: 14 (ref 5–15)
BUN: 11 mg/dL (ref 6–23)
BUN: 12 mg/dL (ref 6–23)
CHLORIDE: 101 meq/L (ref 96–112)
CO2: 31 mEq/L (ref 19–32)
CO2: 26 mEq/L (ref 19–32)
CREATININE: 0.76 mg/dL (ref 0.50–1.10)
Calcium: 9.2 mg/dL (ref 8.4–10.5)
Calcium: 9 mg/dL (ref 8.4–10.5)
Chloride: 101 mEq/L (ref 96–112)
Creat: 0.83 mg/dL (ref 0.50–1.10)
GFR calc Af Amer: >90 mL/min (ref >90)
GFR calc non Af Amer: >90 mL/min (ref >90)
Glucose, Bld: 90 mg/dL (ref 70–99)
Glucose, Bld: 104 mg/dL — ABNORMAL HIGH (ref 70–99)
Potassium: 4.2 mEq/L (ref 3.5–5.3)
Potassium: 4.3 mEq/L (ref 3.7–5.3)
Sodium: 137 mEq/L (ref 135–145)
Sodium: 141 mEq/L (ref 137–147)

## 2014-08-31 LAB — I-STAT TROPONIN, ED: TROPONIN I, POC: 0.03 ng/mL (ref 0.00–0.08)

## 2014-08-31 LAB — TSH: TSH: 3.211 u[IU]/mL (ref 0.350–4.500)

## 2014-08-31 LAB — CBC
HEMATOCRIT: 42.6 % (ref 36.0–46.0)
Hemoglobin: 13.7 g/dL (ref 12.0–15.0)
MCH: 30.2 pg (ref 26.0–34.0)
MCHC: 32.2 g/dL (ref 30.0–36.0)
MCV: 94 fL (ref 78.0–100.0)
Platelets: 298 10*3/uL (ref 150–400)
RBC: 4.53 MIL/uL (ref 3.87–5.11)
RDW: 13 % (ref 11.5–15.5)
WBC: 7.9 10*3/uL (ref 4.0–10.5)

## 2014-08-31 LAB — PROTIME-INR
INR: 1.07 (ref 0.00–1.49)
PROTHROMBIN TIME: 14.1 s (ref 11.6–15.2)

## 2014-08-31 LAB — MAGNESIUM
MAGNESIUM: 2.1 mg/dL (ref 1.5–2.5)
Magnesium: 1.8 mg/dL (ref 1.5–2.5)

## 2014-08-31 LAB — PRO B NATRIURETIC PEPTIDE: Pro B Natriuretic peptide (BNP): 723.7 pg/mL — ABNORMAL HIGH (ref 0–125)

## 2014-08-31 MED ORDER — MAGNESIUM GLUCONATE 500 MG PO TABS
250.0000 mg | ORAL_TABLET | Freq: Two times a day (BID) | ORAL | Status: DC
  Filled 2014-08-31: qty 1

## 2014-08-31 MED ORDER — LEVOTHYROXINE SODIUM 50 MCG PO TABS
50.0000 ug | ORAL_TABLET | Freq: Every day | ORAL | Status: DC
  Administered 2014-09-01 – 2014-09-02 (×2): 50 ug via ORAL
  Filled 2014-08-31 (×2): qty 1

## 2014-08-31 MED ORDER — HEPARIN SODIUM (PORCINE) 5000 UNIT/ML IJ SOLN: 5000.0000 [IU] | Freq: Three times a day (TID) | INTRAMUSCULAR | Status: DC

## 2014-08-31 MED ORDER — GABAPENTIN 300 MG PO CAPS
600.0000 mg | ORAL_CAPSULE | Freq: Three times a day (TID) | ORAL | Status: DC
  Administered 2014-09-01 – 2014-09-02 (×4): 600 mg via ORAL
  Filled 2014-08-31 (×6): qty 2

## 2014-08-31 MED ORDER — DEXTROSE 5 % IV SOLN
5.0000 mg/h | INTRAVENOUS | Status: DC
  Administered 2014-08-31: 5 mg/h via INTRAVENOUS
  Filled 2014-08-31: qty 100

## 2014-08-31 MED ORDER — ALPRAZOLAM 0.25 MG PO TABS
0.2500 mg | ORAL_TABLET | Freq: Two times a day (BID) | ORAL | Status: DC | PRN
  Administered 2014-09-01: 0.25 mg via ORAL
  Filled 2014-08-31: qty 1

## 2014-08-31 MED ORDER — ALBUTEROL SULFATE HFA 108 (90 BASE) MCG/ACT IN AERS: 1.0000 | INHALATION_SPRAY | RESPIRATORY_TRACT | Status: DC | PRN

## 2014-08-31 MED ORDER — ALBUTEROL SULFATE (2.5 MG/3ML) 0.083% IN NEBU
2.5000 mg | INHALATION_SOLUTION | RESPIRATORY_TRACT | Status: DC | PRN
Start: 2014-08-31 — End: 2014-09-02

## 2014-08-31 MED ORDER — OXYCODONE-ACETAMINOPHEN 5-325 MG PO TABS
1.0000 | ORAL_TABLET | ORAL | Status: DC | PRN
  Administered 2014-09-01 – 2014-09-02 (×4): 2 via ORAL
  Filled 2014-08-31 (×4): qty 2

## 2014-08-31 MED ORDER — DOCUSATE SODIUM 100 MG PO CAPS: 100.0000 mg | ORAL_CAPSULE | Freq: Two times a day (BID) | ORAL | Status: DC | PRN

## 2014-08-31 MED ORDER — OLOPATADINE HCL 0.6 % NA SOLN
1.0000 | Freq: Two times a day (BID) | NASAL | Status: DC
  Filled 2014-08-31: qty 30.5

## 2014-08-31 MED ORDER — ACETAMINOPHEN 325 MG PO TABS: 650.0000 mg | ORAL_TABLET | ORAL | Status: DC | PRN

## 2014-08-31 MED ORDER — ASPIRIN EC 81 MG PO TBEC: 81.0000 mg | DELAYED_RELEASE_TABLET | Freq: Every day | ORAL | Status: DC

## 2014-08-31 MED ORDER — MAGNESIUM OXIDE 400 (241.3 MG) MG PO TABS
200.0000 mg | ORAL_TABLET | Freq: Two times a day (BID) | ORAL | Status: DC
  Administered 2014-09-01 – 2014-09-02 (×3): 200 mg via ORAL
  Filled 2014-08-31: qty 0.5

## 2014-08-31 MED ORDER — MEXILETINE HCL 150 MG PO CAPS
300.0000 mg | ORAL_CAPSULE | Freq: Three times a day (TID) | ORAL | Status: DC
  Administered 2014-09-01 – 2014-09-02 (×4): 300 mg via ORAL
  Filled 2014-08-31 (×8): qty 2

## 2014-08-31 MED ORDER — LORATADINE 10 MG PO TABS: 10.0000 mg | ORAL_TABLET | Freq: Every day | ORAL | Status: DC

## 2014-08-31 MED ORDER — FUROSEMIDE 40 MG PO TABS
40.0000 mg | ORAL_TABLET | Freq: Every day | ORAL | Status: DC
  Administered 2014-09-01 – 2014-09-02 (×2): 40 mg via ORAL
  Filled 2014-08-31 (×2): qty 1

## 2014-08-31 MED ORDER — POTASSIUM CHLORIDE CRYS ER 10 MEQ PO TBCR
10.0000 meq | EXTENDED_RELEASE_TABLET | Freq: Every day | ORAL | Status: DC
  Administered 2014-09-01 – 2014-09-02 (×2): 10 meq via ORAL
  Filled 2014-08-31 (×2): qty 1

## 2014-08-31 MED ORDER — POTASSIUM GLUCONATE 595 MG PO CAPS: 595.0000 mg | ORAL_CAPSULE | Freq: Every day | ORAL | Status: DC

## 2014-08-31 MED ORDER — RANOLAZINE ER 500 MG PO TB12
1000.0000 mg | ORAL_TABLET | Freq: Two times a day (BID) | ORAL | Status: DC
Start: 2014-08-31 — End: 2014-09-02
  Administered 2014-09-01 – 2014-09-02 (×3): 1000 mg via ORAL
  Filled 2014-08-31 (×4): qty 2

## 2014-08-31 MED ORDER — FLUTICASONE PROPIONATE HFA 44 MCG/ACT IN AERO
1.0000 | INHALATION_SPRAY | Freq: Two times a day (BID) | RESPIRATORY_TRACT | Status: DC
  Administered 2014-09-01 – 2014-09-02 (×3): 1 via RESPIRATORY_TRACT
  Filled 2014-08-31 (×2): qty 10.6

## 2014-08-31 MED ORDER — APIXABAN 5 MG PO TABS
5.0000 mg | ORAL_TABLET | Freq: Two times a day (BID) | ORAL | Status: DC
  Administered 2014-09-01 – 2014-09-02 (×3): 5 mg via ORAL
  Filled 2014-08-31 (×4): qty 1

## 2014-08-31 MED ORDER — METOPROLOL SUCCINATE ER 25 MG PO TB24
25.0000 mg | ORAL_TABLET | Freq: Two times a day (BID) | ORAL | Status: DC
  Administered 2014-09-01: 25 mg via ORAL
  Filled 2014-08-31: qty 1

## 2014-08-31 MED ORDER — DILTIAZEM HCL 25 MG/5ML IV SOLN
5.0000 mg | Freq: Once | INTRAVENOUS | Status: AC
  Administered 2014-08-31: 5 mg via INTRAVENOUS
  Filled 2014-08-31: qty 5

## 2014-08-31 MED ORDER — DILTIAZEM HCL 100 MG IV SOLR
5.0000 mg/h | Freq: Once | INTRAVENOUS | Status: AC
  Administered 2014-08-31: 5 mg/h via INTRAVENOUS

## 2014-08-31 MED ORDER — SERTRALINE HCL 50 MG PO TABS
50.0000 mg | ORAL_TABLET | Freq: Every day | ORAL | Status: DC
  Administered 2014-09-01 – 2014-09-02 (×2): 50 mg via ORAL
  Filled 2014-08-31 (×2): qty 1

## 2014-08-31 MED ORDER — ZOLPIDEM TARTRATE 5 MG PO TABS: 5.0000 mg | ORAL_TABLET | Freq: Every evening | ORAL | Status: DC | PRN

## 2014-08-31 MED ORDER — ONDANSETRON HCL 4 MG/2ML IJ SOLN: 4.0000 mg | Freq: Four times a day (QID) | INTRAMUSCULAR | Status: DC | PRN

## 2014-08-31 NOTE — ED Notes (Signed)
MD James at bedside.  

## 2014-08-31 NOTE — Progress Notes (Signed)
Device technician here to interrogate ICD.

## 2014-08-31 NOTE — ED Notes (Signed)
Attempted to call report

## 2014-08-31 NOTE — Progress Notes (Signed)
At 2210, when I went to give pt her nightly meds, pt informed me that she had already taken them at 2200. Pt educated about reasons that we do not want her to take medications from home while at the hospital, such as possible drug-drug interactions and adjustments to medications that may be made while in the hospital. Pt verbalizes understanding but states that she is very anxious about maintaining her home medication regimen and that if we do not bring her medications on time, she will take them herself. Pt is A&O x4.   Pt desires to take medication at 2200, 0600, and 1400 which is what she does at home. RN advised pt to request that her medication times be changed while she is in the hospital. Pt states that if scheduled 1000 meds are not given to her at 0600, she will take them herself. RN documented all of the medications that were [facility] by the [facility].

## 2014-08-31 NOTE — Telephone Encounter (Signed)
Called by Byrd HesselbachMaria in cardiac rehab. Pt had 3 minutes of WCT. Mildly symptomatic. ICD did not go off. ICD interrogated and rhythm was atrial tachycardia. Reviewed by Joice LoftsAmber and Dr Ladona Ridgelaylor. I ordered labs today BMP, Mg++, and TSH. F/U with Dr Ladona Ridgelaylor, OK to go home from rehab.  Corine ShelterLUKE Brezlyn Manrique PA-C 08/31/2014 11:27 AM

## 2014-08-31 NOTE — ED Notes (Signed)
Pt monitored by pulse ox, bp cuff, and 12-lead. 

## 2014-08-31 NOTE — ED Notes (Signed)
Pt states has a [employer] pacer/defib that shocked her approx 2 hrs ago after feeling like her heart was going real fast received 2 shocks

## 2014-08-31 NOTE — ED Notes (Addendum)
Technician at bedside to interrogate defibrillator.

## 2014-08-31 NOTE — H&P (Signed)
Patient ID: Karen MannCindy A Soo MRN: [id number], DOB/AGE: 43/03/1971   Admit date: 08/31/2014   Primary Physician: Eustaquio BoydenJavier Gutierrez, MD Primary Cardiologist: Dr Ladona Ridgelaylor  HPI:   43 yo woman with HCM, s/p myomectomy, as well as VT/VF who has been on multiple medications and had multiple ICD shocks. She came in several months ago with a 1:1 tachycardia and was found to have atrial flutter with an RVR. She was successfully ablated. She has been stable on Mexitil and Toprol. This am she was at cardiac rehab and had tachycardia. This lasted about 3 minutes and broke spontaneously. Dr Ladona Ridgelaylor reviewed the strip and felt it was A flutter. Her ICD did not fire then. Labs were done at the office (BMP, Mg++, and TSH), these are pending. She went home and felt fine till later in the afternoon when she was showering. She noted tachycardia and a warm feeling which she associated with her ICD going off. She called the office and was told if it went off again to come to the ER. An hour later it did go off and she is seen in the ER. She is currently in NSR and comfortable.   Problem List: Past Medical History  Diagnosis Date  . Hypertrophic cardiomyopathy     s/p septal myomectomy with implantable defibrillator 01/27/2012   . Anxiety state, unspecified   . Allergic rhinitis     to dogs  . Asthma   . Anemia     during pregnancy  . History of chicken pox   . Generalized headaches   . Ocular migraine     no HA  . Back pain     told cracked bone, vicodin didn't help, improved with percocets/muscle relaxants, no eval by prior PCP  . 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 DVT (deep venous thrombosis) 01/2012    was placed on Warfarin after cardiac surgery but had skin reaction to warfarin so was changed to Pradaxa so DVT occurred while on Pradaxa although unclear if it occurred during transition  . Hypotension   . Atrial fibrillation     a. On pradaxa.   coumadin necrosis with warfarin;  b. Amio d/c'd 02/2012  . Pericardial effusion   . Chronic combined systolic and diastolic CHF (congestive heart failure)     a. echo 03/19/12: severe asymmetric septal hypertrophy, evidence of upper septal myomectomy, peak LV outflow tract gradient 35, EF 35%,;  b.  Echo 9/13: Severe LVH, EF 30-35%, anteroseptal and inferior HK, LVOT narrow, mild AI, mild to moderate mitral stenosis, severe LAE  . Pericardial tamponade 03/14/2012  . 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 01/2012 s/p BSX ICD;  b. 06/2012 Multaq initiated due to recurrent syncope, VT/VF - had severe n/v with IV amio.  . Skin necrosis --COUMADIN   . Chronic chest wall pain     eval by [facility], stable, [facility]  . Syncope     a. in setting of VT/VF.  Marland Kitchen. Hypothyroid   . Blood transfusion without reported diagnosis   . Neuromuscular disorder   . Gall stones   . Atrial flutter 10/2013    with inappropriate ICD therapy; s/p CTI ablation by Dr Ladona Ridgelaylor 11/16/2013  . Automatic implantable cardioverter-defibrillator in situ   . Dysrhythmia   . Pacemaker   . Complication of anesthesia   . PONV (postoperative nausea and vomiting)   .  Depression   . Post-traumatic stress   . Fibromyalgia     Past Surgical History  Procedure Laterality Date  . Cesarean section  1991  . Induced abortion  1990 and 1993  . Cardiac surgery  01/27/2012    median sternotomy, septal myectomy, MVR - [facility] (Dr. Silvestre Mesi)  . Cardiac defibrillator placement  01/2012    BSX dual chamber ICD  . Myomectomy    . Pericardial window  03/14/2012    Procedure: PERICARDIAL WINDOW;  Surgeon: Purcell Nails, MD;  Location: [facility] OR;  Service: Open Heart Surgery;  Laterality: N/A;  Subxyphoid Pericardial  Window   . Ablation  11-16-13    RFCA of atrial flutter by Dr Ladona Ridgel   . Insert / replace / remove pacemaker    . Mitral valve repair    . Cholecystectomy N/A 01/26/2014    LAPAROSCOPIC CHOLECYSTECTOMY WITH  INTRAOPERATIVE CHOLANGIOGRAM;  Surgeon: Wilmon Arms. Corliss Skains, MD     Allergies:  Allergies  Allergen Reactions  . Amiodarone Anaphylaxis and Nausea And Vomiting    Amiodarone tablets cause nausea Can tolerate IV Amiodarone  . Phenergan [Promethazine Hcl] Itching  . Warfarin And Related Other (See Comments)    'burns my skin'  . Nitroglycerin Other (See Comments)    Hypotension     Home Medications Current Facility-Administered Medications  Medication Dose Route Frequency Provider Last Rate Last Dose  . acetaminophen (TYLENOL) tablet 650 mg  650 mg Oral Q4H PRN Luke K Kilroy, PA-C      . albuterol (PROVENTIL HFA;VENTOLIN HFA) 108 (90 BASE) MCG/ACT inhaler 1 puff  1 puff Inhalation Q4H PRN Abelino Derrick, PA-C      . ALPRAZolam Prudy Feeler) tablet 0.25 mg  0.25 mg Oral BID PRN Abelino Derrick, PA-C      . apixaban (ELIQUIS) tablet 5 mg  5 mg Oral BID Eda Paschal Kilroy, PA-C      . [START ON 09/01/2014] aspirin EC tablet 81 mg  81 mg Oral Daily Luke K Kilroy, PA-C      . docusate sodium (COLACE) capsule 100 mg  100 mg Oral BID PRN Eda Paschal Kilroy, PA-C      . fluticasone (FLOVENT HFA) 44 MCG/ACT inhaler 1 puff  1 puff Inhalation BID Luke K Kilroy, PA-C      . [START ON 09/01/2014] furosemide (LASIX) tablet 40 mg  40 mg Oral Daily Luke K Kilroy, PA-C      . gabapentin (NEURONTIN) capsule 600 mg  600 mg Oral TID Abelino Derrick, PA-C      . Melene Muller ON 09/01/2014] levothyroxine (SYNTHROID, LEVOTHROID) tablet 50 mcg  50 mcg Oral QAC breakfast Luke K Kilroy, PA-C      . [START ON 09/01/2014] loratadine (CLARITIN) tablet 10 mg  10 mg Oral Daily Luke K Kilroy, PA-C      . magnesium gluconate (MAGONATE) tablet 250 mg  250 mg Oral BID Luke K Kilroy, PA-C      . metoprolol succinate (TOPROL-XL) 24 hr tablet 25 mg  25 mg Oral BID Luke K Kilroy, PA-C      . mexiletine (MEXITIL) capsule 300 mg  300 mg Oral TID Abelino Derrick, PA-C      . Olopatadine HCl 0.6 % SOLN 1 puff  1 puff Nasal BID Eda Paschal Kilroy, PA-C      .  ondansetron [facility]) injection 4 mg  4 mg Intravenous Q6H PRN Abelino Derrick, PA-C      . oxyCODONE-acetaminophen (PERCOCET/ROXICET) [id number]  MG per tablet 1-2 tablet  1-2 tablet Oral Q4H PRN Abelino Derrick, PA-C      . [START ON 09/01/2014] Potassium Gluconate CAPS 595 mg  595 mg Oral Daily Luke K Kilroy, PA-C      . ranolazine (RANEXA) 12 hr tablet 1,000 mg  1,000 mg Oral BID Luke K Kilroy, PA-C      . [START ON 09/01/2014] sertraline (ZOLOFT) tablet 50 mg  50 mg Oral Daily Luke K Kilroy, PA-C      . zolpidem (AMBIEN) tablet 5 mg  5 mg Oral QHS PRN Abelino Derrick, PA-C       Current Outpatient Prescriptions  Medication Sig Dispense Refill  . acetaminophen (TYLENOL) 500 MG tablet Take 500-1,000 mg by mouth 2 (two) times daily as needed for mild pain, moderate pain, fever or headache (pain).     Marland Kitchen albuterol (PROVENTIL HFA;VENTOLIN HFA) 108 (90 BASE) MCG/ACT inhaler Inhale 1 puff into the lungs every 4 (four) hours as needed for wheezing or shortness of breath.     . ALPRAZolam (XANAX) 1 MG tablet TAKE ONE TABLET BY MOUTH TWICE DAILY AS NEEDED 60 tablet 1  . amoxicillin (AMOXIL) 500 MG capsule Take 2,000 mg by mouth once. For dental procedure  0  . apixaban (ELIQUIS) 5 MG TABS tablet Take 1 tablet (5 mg total) by mouth 2 (two) times daily. 60 tablet 8  . beclomethasone (QVAR) 80 MCG/ACT inhaler Inhale 1 puff into the lungs 2 (two) times daily.    . cetirizine (ZYRTEC) 10 MG tablet Take 10 mg by mouth daily.     Marland Kitchen docusate sodium (COLACE) 100 MG capsule Take 100 mg by mouth 2 (two) times daily as needed for mild constipation.    . furosemide (LASIX) 40 MG tablet TAKE ONE TABLET BY MOUTH ONCE DAILY 30 tablet 0  . gabapentin (NEURONTIN) 300 MG capsule TAKE TWO CAPSULES BY MOUTH THREE TIMES DAILY 180 capsule 0  . levonorgestrel (MIRENA) 20 MCG/24HR IUD 1 each by Intrauterine route once.    Marland Kitchen levothyroxine (SYNTHROID, LEVOTHROID) 50 MCG tablet TAKE ONE TABLET BY MOUTH ONCE DAILY BEFORE BREAKFAST 30 tablet 3    . magnesium gluconate (MAGONATE) 500 MG tablet Take 250 mg by mouth 2 (two) times daily.    . metoprolol succinate (TOPROL-XL) 25 MG 24 hr tablet Take 1 tablet (25 mg total) by mouth 2 (two) times daily. (Patient taking differently: Take 25 mg by mouth See admin instructions. Takes 1 tab daily, can take addt'l tablet for elevated bp) 60 tablet 8  . mexiletine (MEXITIL) 150 MG capsule TAKE TWO CAPSULES BY MOUTH THREE TIMES DAILY 180 capsule 0  . Olopatadine HCl (PATANASE) 0.6 % SOLN Place 1 puff into the nose 2 (two) times daily.    Marland Kitchen oxyCODONE-acetaminophen (PERCOCET/ROXICET) 5-325 MG per tablet Take 1-2 tablets by mouth every 4 (four) hours as needed for moderate pain. 40 tablet 0  . Potassium Gluconate 595 MG CAPS Take 595 mg by mouth daily. OK to take extra on days you need extra lasix    . ranolazine (RANEXA) 1000 MG SR tablet Take 1 tablet (1,000 mg total) by mouth 2 (two) times daily. 180 tablet 3  . sertraline (ZOLOFT) 50 MG tablet Take 1 tablet (50 mg total) by mouth daily. 30 tablet 3     Family History  Problem Relation Age of Onset  . Diabetes Father   . Heart disease Father     unspecified heart problem  . Thyroid disease  Mother   . Anemia Mother     mediterranean anemia, ITP  . Heart disease Maternal Grandfather     CHF  . Hyperlipidemia Maternal Grandfather   . Diabetes Paternal Grandfather   . Cancer Paternal Grandfather   . Coronary artery disease Neg Hx   . Stroke Neg Hx   . Sudden death Neg Hx      History   Social History  . Marital Status: Single    Spouse Name: N/A    Number of Children: 1  . Years of Education: N/A   Occupational History  . Naval architectrestaurant manager    Social History Main Topics  . Smoking status: Former Smoker -- 1.00 packs/day for 3 years    Types: Cigarettes    Quit date: 09/30/1988  . Smokeless tobacco: Never Used     Comment: quitin 1990  . Alcohol Use: No     Comment: none since "months" as of 02/28/2013  . Drug Use: No      Comment: quiti n 1989  . Sexual Activity: Yes    Birth Control/ Protection: Pill   Other Topics Concern  . Not on file   Social History Narrative   Caffeine: 1 cup coffee/day   Divorced, 1 child.  lives with boyfriend, dogs   Occupation: Full time Tax adviserfast food restaurant manager   Activity: walks 4530min/day   Diet: fruits/vegetables daily, water, no fish     Review of Systems: General: negative for chills, fever, night sweats or weight changes.  Cardiovascular: negative for chest pain, dyspnea on exertion, edema, orthopnea, palpitations, paroxysmal nocturnal dyspnea or shortness of breath Dermatological: negative for rash Respiratory: negative for cough or wheezing Urologic: negative for hematuria Abdominal: negative for nausea, vomiting, diarrhea, bright red blood per rectum, melena, or hematemesis Neurologic: negative for visual changes, syncope, or dizziness All other systems reviewed and are otherwise negative except as noted above.  Physical Exam: Blood pressure 104/57, pulse 74, temperature 98.7 F (37.1 C), temperature source Oral, resp. rate 18, height 5' (1.524 m), weight 230 lb (104.327 kg), last menstrual period 08/01/2014, SpO2 97 %.  General appearance: alert, cooperative, no distress and moderately obese Neck: no carotid bruit and no JVD Lungs: clear to auscultation bilaterally Heart: regular rate and rhythm Abdomen: obese Extremities: extremities normal, atraumatic, no cyanosis or edema Pulses: 2+ and symmetric Skin: Skin color, texture, turgor normal. No rashes or lesions Neurologic: Grossly normal    Labs:   Results for orders placed or performed during the hospital encounter of 08/31/14 (from the past 24 hour(s))  Basic metabolic panel     Status: Abnormal   Collection Time: 08/31/14  5:56 PM  Result Value Ref Range   Sodium 141 137 - 147 mEq/L   Potassium 4.3 3.7 - 5.3 mEq/L   Chloride 101 96 - 112 mEq/L   CO2 26 19 - 32 mEq/L   Glucose, Bld 104 (H) 70  - 99 mg/dL   BUN 12 6 - 23 mg/dL   Creatinine, Ser 1.610.76 0.50 - 1.10 mg/dL   Calcium 9.0 8.4 - 09.610.5 mg/dL   GFR calc non Af Amer >90 >90 mL/min   GFR calc Af Amer >90 >90 mL/min   Anion gap 14 5 - 15  CBC     Status: None   Collection Time: 08/31/14  5:56 PM  Result Value Ref Range   WBC 7.9 4.0 - 10.5 K/uL   RBC 4.53 3.87 - 5.11 MIL/uL   Hemoglobin 13.7 12.0 - 15.0  g/dL   HCT 96.0 45.4 - 09.8 %   MCV 94.0 78.0 - 100.0 fL   MCH 30.2 26.0 - 34.0 pg   MCHC 32.2 30.0 - 36.0 g/dL   RDW 11.9 14.7 - 82.9 %   Platelets 298 150 - 400 K/uL  BNP (order ONLY if patient complains of dyspnea/SOB AND you have documented it for THIS visit)     Status: Abnormal   Collection Time: 08/31/14  5:56 PM  Result Value Ref Range   Pro B Natriuretic peptide (BNP) 723.7 (H) 0 - 125 pg/mL  Magnesium     Status: None   Collection Time: 08/31/14  5:56 PM  Result Value Ref Range   Magnesium 2.1 1.5 - 2.5 mg/dL  I-stat troponin, ED (not at [facility])     Status: None   Collection Time: 08/31/14  6:15 PM  Result Value Ref Range   Troponin i, poc 0.03 0.00 - 0.08 ng/mL   Comment 3          Protime-INR     Status: None   Collection Time: 08/31/14  7:20 PM  Result Value Ref Range   Prothrombin Time 14.1 11.6 - 15.2 seconds   INR 1.07 0.00 - 1.49     Radiology/Studies: Dg Chest Port 1 View  08/31/2014   CLINICAL DATA:  AICD problem  EXAM: PORTABLE CHEST - 1 VIEW  COMPARISON:  01/19/2014  FINDINGS: Cardiomediastinal silhouette is stable. Three leads cardiac pacemaker is unchanged in position. No acute infiltrate or pulmonary edema. No pneumothorax.  IMPRESSION: No active disease. Stable 3 leads cardiac pacemaker position. No pneumothorax.   Electronically Signed   By: Natasha Mead M.D.   On: 08/31/2014 18:11    EKG:NSR, LBBB  ASSESSMENT AND PLAN:     ICD (implantable cardioverter-defibrillator) discharge   Secondary to rapid SVT felt to be atrial tachycardia.  Dr Ladona Ridgel has spoken to ICD rep and me. We are starting  IV cardizem. He will see pt in AM. I have updated the fellow on call.    History of     VT (ventricular tachycardia)  History of    Atrial flutter    Asthma   Hypertrophic cardiomyopathy- s/p myomectomy   Chronic anticoagulation   ICD (implantable cardioverter-defibrillator) in place   Depression with anxiety   Hypothyroidism   PLAN: Reviewed with Dr Myrtis Ser and Dr Ladona Ridgel. Plan is for IV Diltiazem. Dr Ladona Ridgel to evaluate in am.    Signed, Abelino Derrick, PA-C Patient seen and examined. I agree with the assessment and plan as detailed above. See also my additional thoughts below.   I wrote the assessment above. Plan is as outlined.  Willa Rough, MD, [facility] 08/31/2014 8:18 PM     08/31/2014, 7:59 PM

## 2014-08-31 NOTE — Progress Notes (Addendum)
Wide complex tachy cardia noted today during warm up for cardiac rehab.   Rate 170's . The wide complex tachy was sustained for about 3 minutes.  Blood pressure 138/73. Patient felt her heart racing. Oxygen saturation 97% on room air.  [name]. Lung fields clear bilateral ankle edema present. Weight today 99.0 kg. Patient reported feeling short of breath while walking the track otherwise denies feeling short of breath at present. Exercise terminated after tachy rhythm noted. Patient taken to the treatment room. [name] and reviewed ECG tracings. [name] said he will discuss Karen AspCindy with Dr Katrinka BlazingSmith.

## 2014-08-31 NOTE — ED Notes (Signed)
Cardiologist at bedside.  

## 2014-08-31 NOTE — Telephone Encounter (Signed)
Pt shocked today x1 while in shower. Pt did not have symptoms prior to being shocked. She did feel her heart racing. She does not currently have symptoms of SOB, heart racing, or light-headedness.   Latitude transmission shows pt was having AVNRT w/ V-rate of 195bpm. Rate was in her VT zone of 180bpm. Episode shows SVT changing into AVNRT. After being shocked, pt had short NSVT. Device began to charge again but NSVT broke on it's on. Second shock was averted.   Episode will be reviewed w/ Dr. Ladona Ridgelaylor. Pt aware no driving [id number].

## 2014-08-31 NOTE — Progress Notes (Signed)
Barbara CowerJason discussed Keara's  Device interrogation with Financial controllerAmber Siler RN and Dr Ladona Ridgelaylor. Patient okayed to go home. Corine [facility]Luke Kilroy [facility] and notified about recent weight gain. Luke ordered lab work for [employer]all at [facility] [facility]. Exit blood pressure 95/55 sitting standing 116/64 heart rate 75.

## 2014-08-31 NOTE — ED Provider Notes (Signed)
CSN: [id number]     Arrival date & time 08/31/14  1730 History   First MD Initiated Contact with Patient 08/31/14 1732     Chief Complaint  Patient presents with  . AICD Problem      HPI  43 year old female presents for evaluation after AICD  delivered shock 2.  Significant history for hypertrophic cardiomyopathy. Status post myomectomy, April 2013, AICD placement May 2013, and subxiphoid pericardial window June 2013. Had been doing well until June of this year. She developed episodes of atrial tachycardia. Underwent successful ablation. Has not had shocks or tachyarrhythmias since that time.  This morning she was at cardiac rehabilitation. She was noted to be tachycardic and was placed on a monitor, and had interrogation of her device. Showed narrow complex rhythm described is SVT/AVNRT at 195 which is above her intervention threshold for her tachycardia device. No shock was delivered. She returned home. She was in the shower. She felt her heart racing. She's felt delivered shock. She knelt down the floor. She did not lose consciousness or fall. She recovered with some health is able to walk to her couch. She laid there for a while. She and her husband had her device interrogated via their home telephone device. She was told that this was SVT, followed by VT. As she was sitting on the couch she had another delivered shock. This was preceded by a warm flushed feeling in her head and face, as well as a sensation of tachycardia arrhythmia, this is her typical sensation prior to shock for VT in the past.    Past Medical History  Diagnosis Date  . Hypertrophic cardiomyopathy     s/p septal myomectomy with implantable defibrillator 01/27/2012   . Anxiety state, unspecified   . Allergic rhinitis     to dogs  . Asthma   . Anemia     during pregnancy  . History of chicken pox   . Generalized headaches   . Ocular migraine     no HA  . Back pain     told cracked bone, vicodin didn't help,  improved with percocets/muscle relaxants, no eval by prior PCP  . 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 DVT (deep venous thrombosis) 01/2012    was placed on Warfarin after cardiac surgery but had skin reaction to warfarin so was changed to Pradaxa so DVT occurred while on Pradaxa although unclear if it occurred during transition  . Hypotension   . Atrial fibrillation     a. On pradaxa.  coumadin necrosis with warfarin;  b. Amio d/c'd 02/2012  . Pericardial effusion   . Chronic combined systolic and diastolic CHF (congestive heart failure)     a. echo 03/19/12: severe asymmetric septal hypertrophy, evidence of upper septal myomectomy, peak LV outflow tract gradient 35, EF 35%,;  b.  Echo 9/13: Severe LVH, EF 30-35%, anteroseptal and inferior HK, LVOT narrow, mild AI, mild to moderate mitral stenosis, severe LAE  . Pericardial tamponade 03/14/2012  . 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 01/2012 s/p BSX ICD;  b. 06/2012 Multaq initiated due to recurrent syncope, VT/VF - had severe n/v with IV amio.  . Skin necrosis --COUMADIN   . Chronic chest wall pain     eval by CVTS, stable, [facility]  . Syncope     a. in setting of VT/VF.  Marland Kitchen. Hypothyroid   . Blood  transfusion without reported diagnosis   . Neuromuscular disorder   . Gall stones   . Atrial flutter 10/2013    with inappropriate ICD therapy; s/p CTI ablation by Dr Ladona Ridgel 11/16/2013  . Automatic implantable cardioverter-defibrillator in situ   . Dysrhythmia   . Pacemaker   . Complication of anesthesia   . PONV (postoperative nausea and vomiting)   . Depression   . Post-traumatic stress   . Fibromyalgia    Past Surgical History  Procedure Laterality Date  . Cesarean section  1991  . Induced abortion  1990 and 1993  . Cardiac surgery  01/27/2012    median sternotomy, septal myectomy, MVR - [facility] (Dr. Silvestre Mesi)  . Cardiac defibrillator placement  01/2012     BSX dual chamber ICD  . Myomectomy    . Pericardial window  03/14/2012    Procedure: PERICARDIAL WINDOW;  Surgeon: Purcell Nails, MD;  Location: [facility] OR;  Service: Open Heart Surgery;  Laterality: N/A;  Subxyphoid Pericardial  Window   . Ablation  11-16-13    RFCA of atrial flutter by Dr Ladona Ridgel   . Insert / replace / remove pacemaker    . Mitral valve repair    . Cholecystectomy N/A 01/26/2014    LAPAROSCOPIC CHOLECYSTECTOMY WITH INTRAOPERATIVE CHOLANGIOGRAM;  Surgeon: Wilmon Arms. Tsuei, MD   Family History  Problem Relation Age of Onset  . Diabetes Father   . Heart disease Father     unspecified heart problem  . Thyroid disease Mother   . Anemia Mother     mediterranean anemia, ITP  . Heart disease Maternal Grandfather     CHF  . Hyperlipidemia Maternal Grandfather   . Diabetes Paternal Grandfather   . Cancer Paternal Grandfather   . Coronary artery disease Neg Hx   . Stroke Neg Hx   . Sudden death Neg Hx    History  Substance Use Topics  . Smoking status: Former Smoker -- 1.00 packs/day for 3 years    Types: Cigarettes    Quit date: 09/30/1988  . Smokeless tobacco: Never Used     Comment: quitin 1990  . Alcohol Use: No     Comment: none since "months" as of 02/28/2013   OB History    No data available     Review of Systems  Constitutional: Negative for fever, chills, diaphoresis, appetite change and fatigue.  HENT: Negative for mouth sores, sore throat and trouble swallowing.   Eyes: Negative for visual disturbance.  Respiratory: Negative for cough, chest tightness, shortness of breath and wheezing.   Cardiovascular: Positive for palpitations. Negative for chest pain.  Gastrointestinal: Negative for nausea, vomiting, abdominal pain, diarrhea and abdominal distention.  Endocrine: Negative for polydipsia, polyphagia and polyuria.  Genitourinary: Negative for dysuria, frequency and hematuria.  Musculoskeletal: Negative for gait problem.  Skin: Negative for color change,  pallor and rash.  Neurological: Negative for dizziness, syncope, light-headedness and headaches.  Hematological: Does not bruise/bleed easily.  Psychiatric/Behavioral: Negative for behavioral problems and confusion.      Allergies  Amiodarone; Phenergan; Warfarin and related; and Nitroglycerin  Home Medications   Prior to Admission medications   Medication Sig Start Date End Date Taking? Authorizing Provider  acetaminophen (TYLENOL) 500 MG tablet Take 500-1,000 mg by mouth 2 (two) times daily as needed for mild pain, moderate pain, fever or headache (pain).    Yes Historical Provider, MD  albuterol (PROVENTIL HFA;VENTOLIN HFA) 108 (90 BASE) MCG/ACT inhaler Inhale 1 puff into the lungs every 4 (four)  hours as needed for wheezing or shortness of breath.    Yes Historical Provider, MD  ALPRAZolam Prudy Feeler(XANAX) 1 MG tablet TAKE ONE TABLET BY MOUTH TWICE DAILY AS NEEDED 08/04/14  Yes Eustaquio BoydenJavier Gutierrez, MD  amoxicillin (AMOXIL) 500 MG capsule Take 2,000 mg by mouth once. For dental procedure 08/19/14  Yes Historical Provider, MD  apixaban (ELIQUIS) 5 MG TABS tablet Take 1 tablet (5 mg total) by mouth 2 (two) times daily. 05/16/14  Yes Marinus MawGregg W Taylor, MD  beclomethasone (QVAR) 80 MCG/ACT inhaler Inhale 1 puff into the lungs 2 (two) times daily.   Yes Historical Provider, MD  cetirizine (ZYRTEC) 10 MG tablet Take 10 mg by mouth daily.    Yes Historical Provider, MD  docusate sodium (COLACE) 100 MG capsule Take 100 mg by mouth 2 (two) times daily as needed for mild constipation.   Yes Historical Provider, MD  furosemide (LASIX) 40 MG tablet TAKE ONE TABLET BY MOUTH ONCE DAILY 07/18/14  Yes Tonny BollmanMichael Cooper, MD  gabapentin (NEURONTIN) 300 MG capsule TAKE TWO CAPSULES BY MOUTH THREE TIMES DAILY 08/08/14  Yes Adam Gus Rankinobert Jaffe, DO  levonorgestrel (MIRENA) 20 MCG/24HR IUD 1 each by Intrauterine route once.   Yes Historical Provider, MD  levothyroxine (SYNTHROID, LEVOTHROID) 50 MCG tablet TAKE ONE TABLET BY MOUTH  ONCE DAILY BEFORE BREAKFAST 08/08/14  Yes Eustaquio BoydenJavier Gutierrez, MD  magnesium gluconate (MAGONATE) 500 MG tablet Take 250 mg by mouth 2 (two) times daily.   Yes Historical Provider, MD  metoprolol succinate (TOPROL-XL) 25 MG 24 hr tablet Take 1 tablet (25 mg total) by mouth 2 (two) times daily. Patient taking differently: Take 25 mg by mouth See admin instructions. Takes 1 tab daily, can take addt'l tablet for elevated bp 05/16/14 05/16/15 Yes Marinus MawGregg W Taylor, MD  mexiletine (MEXITIL) 150 MG capsule TAKE TWO CAPSULES BY MOUTH THREE TIMES DAILY 07/18/14  Yes Marinus MawGregg W Taylor, MD  Olopatadine HCl (PATANASE) 0.6 % SOLN Place 1 puff into the nose 2 (two) times daily.   Yes Historical Provider, MD  oxyCODONE-acetaminophen (PERCOCET/ROXICET) 5-325 MG per tablet Take 1-2 tablets by mouth every 4 (four) hours as needed for moderate pain. 01/28/14  Yes Manus RuddMatthew Tsuei, MD  Potassium Gluconate 595 MG CAPS Take 595 mg by mouth daily. OK to take extra on days you need extra lasix   Yes Historical Provider, MD  ranolazine (RANEXA) 1000 MG SR tablet Take 1 tablet (1,000 mg total) by mouth 2 (two) times daily. 12/13/13 12/13/14 Yes Tonny BollmanMichael Cooper, MD  sertraline (ZOLOFT) 50 MG tablet Take 1 tablet (50 mg total) by mouth daily. 08/09/14  Yes Eustaquio BoydenJavier Gutierrez, MD   BP 104/57 mmHg  Pulse 74  Temp(Src) 98.7 F (37.1 C) (Oral)  Resp 18  Ht 5' (1.524 m)  Wt 230 lb (104.327 kg)  BMI 44.92 kg/m2  SpO2 97%  LMP 08/01/2014 Physical Exam  Constitutional: She is oriented to person, place, and time. She appears well-developed and well-nourished. No distress.  HENT:  Head: Normocephalic.  Eyes: Conjunctivae are normal. Pupils are equal, round, and reactive to light. No scleral icterus.  Neck: Normal range of motion. Neck supple. No thyromegaly present.  Cardiovascular: Normal rate and regular rhythm.  Exam reveals no gallop and no friction rub.   No murmur heard. Pulmonary/Chest: Effort normal and breath sounds normal. No  respiratory distress. She has no wheezes. She has no rales.  Abdominal: Soft. Bowel sounds are normal. She exhibits no distension. There is no tenderness. There is no rebound.  Musculoskeletal: Normal range of motion.  Neurological: She is alert and oriented to person, place, and time.  Skin: Skin is warm and dry. No rash noted.  Psychiatric: She has a normal mood and affect. Her behavior is normal.  Appropriately anxious.    ED Course  Procedures (including critical care time) Labs Review Labs Reviewed  BASIC METABOLIC PANEL - Abnormal; Notable for the following:    Glucose, Bld 104 (*)    All other components within normal limits  PRO B NATRIURETIC PEPTIDE - Abnormal; Notable for the following:    Pro B Natriuretic peptide (BNP) 723.7 (*)    All other components within normal limits  CBC  MAGNESIUM  PROTIME-INR  Rosezena Sensor, ED    Imaging Review Dg Chest Port 1 View  08/31/2014   CLINICAL DATA:  AICD problem  EXAM: PORTABLE CHEST - 1 VIEW  COMPARISON:  01/19/2014  FINDINGS: Cardiomediastinal silhouette is stable. Three leads cardiac pacemaker is unchanged in position. No acute infiltrate or pulmonary edema. No pneumothorax.  IMPRESSION: No active disease. Stable 3 leads cardiac pacemaker position. No pneumothorax.   Electronically Signed   By: Natasha Mead M.D.   On: 08/31/2014 18:11     EKG Interpretation None      MDM   Final diagnoses:  SVT (supraventricular tachycardia)     Resting EKG shows sinus rhythm with bundle. Her electrolytes show no acute abnormalities. X-ray shows no abnormalities. The rep from [employer] is here. Interrogation of her device shows that the 2 delivered shocks were each for an episode of narrow complex SVT/AVNRT at rates between 210 and 220. Each were preceded by an episode of attempted pacing before delivered shock. After the first delivered shot she had a 7-10 second run of VT, then sinus rhythm. Second shock resulted in immediate  conversion to sinus rhythm.  Care discussed with Dr. Myrtis Ser. Cardiology here planning admission. Placed on Cardizem drip 5 mg per hour, given 5 mg IV bolus.   Rolland Porter, MD 08/31/14 [id number]

## 2014-09-01 ENCOUNTER — Telehealth: Payer: Self-pay

## 2014-09-01 DIAGNOSIS — I484 Atypical atrial flutter: Secondary | ICD-10-CM

## 2014-09-01 DIAGNOSIS — I472 Ventricular tachycardia: Secondary | ICD-10-CM

## 2014-09-01 LAB — BASIC METABOLIC PANEL
Anion gap: 12 (ref 5–15)
BUN: 11 mg/dL (ref 6–23)
CO2: 24 mEq/L (ref 19–32)
Calcium: 8.6 mg/dL (ref 8.4–10.5)
Chloride: 103 mEq/L (ref 96–112)
Creatinine, Ser: 0.76 mg/dL (ref 0.50–1.10)
GFR calc Af Amer: >90 mL/min (ref >90)
GFR calc non Af Amer: >90 mL/min (ref >90)
Glucose, Bld: 95 mg/dL (ref 70–99)
Potassium: 3.9 mEq/L (ref 3.7–5.3)
Sodium: 139 mEq/L (ref 137–147)

## 2014-09-01 MED ORDER — METOPROLOL SUCCINATE ER 25 MG PO TB24
25.0000 mg | ORAL_TABLET | Freq: Every day | ORAL | Status: DC
  Administered 2014-09-02: 25 mg via ORAL
  Filled 2014-09-01: qty 1

## 2014-09-01 MED ORDER — ALPRAZOLAM 0.5 MG PO TABS
1.0000 mg | ORAL_TABLET | Freq: Two times a day (BID) | ORAL | Status: DC | PRN
  Administered 2014-09-01: 1 mg via ORAL
  Filled 2014-09-01: qty 2

## 2014-09-01 MED ORDER — ALPRAZOLAM 1 MG PO TABS: 1.0000 mg | ORAL_TABLET | Freq: Three times a day (TID) | ORAL | Status: DC | PRN

## 2014-09-01 NOTE — Telephone Encounter (Signed)
Noted pt currently in hospital. Ok to increase xanax temporarily #70 per month. Continue sertraline 50mg  daily.

## 2014-09-01 NOTE — Telephone Encounter (Signed)
Pt left v/m requesting change in instructions for alprazolam to tid prn before refilled to [facility] [address]; pt said some days she needs to take more than 2 daily. 12/13/13 office note increased xanax to tid prn for short period. Pt last seen 08/09/14.Please advise.

## 2014-09-01 NOTE — Telephone Encounter (Signed)
Patient notified and Rx called in as directed. 

## 2014-09-01 NOTE — Progress Notes (Signed)
Patient Name: [name]      SUBJECTIVE  Complex history with prior myectomy and implantable defibrillator and mitral valve repair in the context of hypertrophic cardiomyopathy  She has had appropriate and inappropriate therapies for which she is undergoing ablation both in the ventricle. In the atrium she has been treated with multiple medications and is not amenable to amiodarone  she has been treated most recently with mexiletine.  Past Medical History  Diagnosis Date  . Hypertrophic cardiomyopathy     s/p septal myomectomy with implantable defibrillator 01/27/2012   . Anxiety state, unspecified   . Allergic rhinitis     to dogs  . Asthma   . Anemia     during pregnancy  . History of chicken pox   . Generalized headaches   . Ocular migraine     no HA  . Back pain     told cracked bone, vicodin didn't help, improved with percocets/muscle relaxants, no eval by prior PCP  . 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 DVT (deep venous thrombosis) 01/2012    was placed on Warfarin after cardiac surgery but had skin reaction to warfarin so was changed to Pradaxa so DVT occurred while on Pradaxa although unclear if it occurred during transition  . Hypotension   . Atrial fibrillation     a. On pradaxa.  coumadin necrosis with warfarin;  b. Amio d/c'd 02/2012  . Pericardial effusion   . Chronic combined systolic and diastolic CHF (congestive heart failure)     a. echo 03/19/12: severe asymmetric septal hypertrophy, evidence of upper septal myomectomy, peak LV outflow tract gradient 35, EF 35%,;  b.  Echo 9/13: Severe LVH, EF 30-35%, anteroseptal and inferior HK, LVOT narrow, mild AI, mild to moderate mitral stenosis, severe LAE  . Pericardial tamponade 03/14/2012  . 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 01/2012 s/p BSX ICD;  b. 06/2012 Multaq initiated due to recurrent syncope, VT/VF - had severe n/v with IV amio.  .  Skin necrosis --COUMADIN   . Chronic chest wall pain     eval by CVTS, stable, rec [facility] if continued [facility]  . Syncope     a. in setting of VT/VF.  Marland Kitchen. Hypothyroid   . Blood transfusion without reported diagnosis   . Neuromuscular disorder   . Gall stones   . Atrial flutter 10/2013    with inappropriate ICD therapy; s/p CTI ablation by Dr Ladona Ridgelaylor 11/16/2013  . Automatic implantable cardioverter-defibrillator in situ   . Dysrhythmia   . Pacemaker   . Complication of anesthesia   . PONV (postoperative nausea and vomiting)   . Depression   . Post-traumatic stress   . Fibromyalgia     Scheduled Meds:  Scheduled Meds: . apixaban  5 mg Oral BID  . aspirin EC  81 mg Oral Daily  . fluticasone  1 puff Inhalation BID  . furosemide  40 mg Oral Daily  . gabapentin  600 mg Oral TID  . levothyroxine  50 mcg Oral QAC breakfast  . loratadine  10 mg Oral Daily  . magnesium oxide  200 mg Oral BID  . metoprolol succinate  25 mg Oral BID  . mexiletine  300 mg Oral TID  . Olopatadine HCl  1 puff Nasal BID  . potassium chloride  10 mEq Oral Daily  . ranolazine  1,000 mg Oral  BID  . sertraline  50 mg Oral Daily   Continuous Infusions: . diltiazem (CARDIZEM) infusion Stopped (09/01/14 0327)   acetaminophen, albuterol, ALPRAZolam, docusate sodium, ondansetron (ZOFRAN) IV, oxyCODONE-acetaminophen, zolpidem    PHYSICAL EXAM Filed Vitals:   09/01/14 0600 09/01/14 0730 09/01/14 0733 09/01/14 0818  BP: 99/53 92/50  134/112  Pulse:  77    Temp:      TempSrc:      Resp:  17    Height:      Weight:      SpO2:   98%     ,Well developed and nourished in no acute distress HENT normal Neck supple with JVP-flat Clear Regular rate and rhythm, no murmurs or gallops Abd-soft with active BS No Clubbing cyanosis edema Skin-warm and dry A & Oriented  Grossly normal sensory and motor function   TELEMETRY: Reviewed telemetry pt in *sinus   Intake/Output Summary (Last 24 hours) at  09/01/14 0834 Last data filed at 08/31/14 2300  Gross per 24 hour  Intake    240 ml  Output      0 ml  Net    240 ml    LABS: Basic Metabolic Panel:  Recent Labs Lab 08/31/14 1204 08/31/14 1756 09/01/14 0406  NA 137 141 139  K 4.2 4.3 3.9  CL 101 101 103  CO2 31 26 24   GLUCOSE 90 104* 95  BUN 11 12 11   CREATININE 0.83 0.76 0.76  CALCIUM 9.2 9.0 8.6  MG 1.8 2.1  --    Cardiac Enzymes: No results for input(s): CKTOTAL, CKMB, CKMBINDEX, TROPONINI in the last 72 hours. CBC:  Recent Labs Lab 08/31/14 1756  WBC 7.9  HGB 13.7  HCT 42.6  MCV 94.0  PLT 298   PROTIME:  Recent Labs  08/31/14 1920  LABPROT 14.1  INR 1.07   Liver Function Tests: No results for input(s): AST, ALT, ALKPHOS, BILITOT, PROT, ALBUMIN in the last 72 hours. No results for input(s): LIPASE, AMYLASE in the last 72 hours. BNP: BNP (last 3 results)  Recent Labs  11/14/13 1625 11/27/13 1045 08/31/14 1756  PROBNP 594.9* 494.6* 723.7*   D-Dimer: No results for input(s): DDIMER in the last 72 hours. Hemoglobin A1C: No results for input(s): HGBA1C in the last 72 hours. Fasting Lipid Panel: No results for input(s): CHOL, HDL, LDLCALC, TRIG, CHOLHDL, LDLDIRECT in the last 72 hours. Thyroid Function Tests:  Recent Labs  08/31/14 1204  TSH 3.211   Anemia Panel: No results for input(s): VITAMINB12, FOLATE, FERRITIN, TIBC, IRON, RETICCTPCT in the last 72 hours.   Device Interrogation: Atrial tach >>inappropriate shock   ASSESSMENT AND PLAN:  Principal Problem:   ICD (implantable cardioverter-defibrillator) discharge Active Problems:   Depression with anxiety   Asthma   Hypertrophic cardiomyopathy- s/p myomectomy   VT (ventricular tachycardia)   Chronic anticoagulation   Hypothyroidism   ICD (implantable cardioverter-defibrillator) in place   Atrial flutter  Have reprogrammed device to inactivate SRD, to increase lower dectection to 200 and to decrease confomration index from  94--89% Dt GT in am will discuss strategy for Atrial tach, AV ablation, repeat catheter ablation and maybe multaq +ranexa might be workth trying or even adding ranexa to [employer]  Signed, Sherryl MangesSteven Guila Owensby MD  09/01/2014

## 2014-09-01 NOTE — Plan of Care (Signed)
Problem: Consults Goal: General Medical Patient Education See Patient Education Module for specific education. Outcome: Completed/Met Date Met:  09/01/14 Goal: Skin Care Protocol Initiated - if Braden Score 18 or less If consults are not indicated, leave blank or document N/A Outcome: Not Applicable Date Met:  48/01/65 Goal: Nutrition Consult-if indicated Outcome: Not Applicable Date Met:  53/74/82 Goal: Diabetes Guidelines if Diabetic/Glucose > 140 If diabetic or lab glucose is > 140 mg/dl - Initiate Diabetes/Hyperglycemia Guidelines & Document Interventions  Outcome: Not Applicable Date Met:  70/78/67  Problem: Phase I Progression Outcomes Goal: Pain controlled with appropriate interventions Outcome: Completed/Met Date Met:  09/01/14 Goal: Voiding-avoid urinary catheter unless indicated Outcome: Completed/Met Date Met:  09/01/14 Goal: Hemodynamically stable Outcome: Completed/Met Date Met:  09/01/14

## 2014-09-01 NOTE — Progress Notes (Signed)
Stopped pt's cardizem drip at 0327 per parameters. Pt's BP was 82/41. Pt was asymptomatic. MD notified; no orders received. Pt's HR maintained A-paced in the 70s.

## 2014-09-01 NOTE — Progress Notes (Signed)
UR completed 

## 2014-09-02 ENCOUNTER — Encounter (HOSPITAL_COMMUNITY): Payer: BC Managed Care – PPO

## 2014-09-02 ENCOUNTER — Encounter (HOSPITAL_COMMUNITY): Payer: Self-pay | Admitting: *Deleted

## 2014-09-02 MED ORDER — METOPROLOL SUCCINATE ER 25 MG PO TB24: 25.0000 mg | ORAL_TABLET | Freq: Two times a day (BID) | ORAL | Status: DC

## 2014-09-02 MED ORDER — FUROSEMIDE 40 MG PO TABS: 40.0000 mg | ORAL_TABLET | Freq: Every day | ORAL | Status: DC

## 2014-09-02 NOTE — Discharge Summary (Signed)
ELECTROPHYSIOLOGY PROCEDURE DISCHARGE SUMMARY    Patient ID: [name],  MRN: [id number], DOB/AGE: 43/03/1971 43 y.o.  Admit date: 08/31/2014 Discharge date: 09/07/2014  Primary Care Physician: Eustaquio BoydenJavier Gutierrez, MD  Electrophysiologist: Ladona Ridgelaylor  Primary Discharge Diagnosis:  Atrial tachycardia with inappropriate ICD therapy  Secondary Discharge Diagnosis:  1.  Atrial flutter s/p ablation 2.  Hypertrophic cardiomyopathy s/p myomectomy  3.  S/p mitral valve repair 4.  Prior DVT 5.  Atrial fibrillation 6.  Prior VF arrest 7.  Hypothyroidism  Allergies  Allergen Reactions  . Amiodarone Anaphylaxis and Nausea And Vomiting    Amiodarone tablets cause nausea Can tolerate IV Amiodarone  . Phenergan [Promethazine Hcl] Itching  . Warfarin And Related Other (See Comments)    'burns my skin'  . Nitroglycerin Other (See Comments)    Hypotension     Brief HPI/Hospital Course:  [name] is a 43 y.o. female with a past medical history as outlined above.  She was admitted 08-31-2014 with inappropriate ICD shocks for AVNRT. Lab work was obtained and unremarkable.  Her device was reprogrammed to prevent further inappropriate therapies. Her Metoprolol was increased to 25mg  twice daily at discharge.  She was evaluated and considered stable for discharge to home by Dr Ladona Ridgelaylor. She will follow up in the office in 2-3 weeks.   Discharge Vitals: Blood pressure 108/62, pulse 75, temperature 98.9 F (37.2 C), temperature source Oral, resp. rate 18, height 5' (1.524 m), weight 221 lb 9.6 oz (100.517 kg), last menstrual period 08/01/2014, SpO2 98 %.   Labs:   Lab Results  Component Value Date   WBC 7.9 08/31/2014   HGB 13.7 08/31/2014   HCT 42.6 08/31/2014   MCV 94.0 08/31/2014   PLT 298 08/31/2014     Recent Labs Lab 09/01/14 0406  NA 139  K 3.9  CL 103  CO2 24  BUN 11  CREATININE 0.76  CALCIUM 8.6  GLUCOSE 95     Discharge Medications:    Medication List      STOP taking these medications        gabapentin 300 MG capsule  Commonly known as:  NEURONTIN     mexiletine 150 MG capsule  Commonly known as:  MEXITIL      TAKE these medications        acetaminophen 500 MG tablet  Commonly known as:  TYLENOL  Take 500-1,000 mg by mouth 2 (two) times daily as needed for mild pain, moderate pain, fever or headache (pain).     albuterol 108 (90 BASE) MCG/ACT inhaler  Commonly known as:  PROVENTIL HFA;VENTOLIN HFA  Inhale 1 puff into the lungs every 4 (four) hours as needed for wheezing or shortness of breath.     ALPRAZolam 1 MG tablet  Commonly known as:  XANAX  Take 1 tablet (1 mg total) by mouth 3 (three) times daily as needed.     amoxicillin 500 MG capsule  Commonly known as:  AMOXIL  Take 2,000 mg by mouth once. For dental procedure     apixaban 5 MG Tabs tablet  Commonly known as:  ELIQUIS  Take 1 tablet (5 mg total) by mouth 2 (two) times daily.     beclomethasone 80 MCG/ACT inhaler  Commonly known as:  QVAR  Inhale 1 puff into the lungs 2 (two) times daily. 9am and 8pm     cetirizine 10 MG tablet  Commonly known as:  ZYRTEC  Take 10 mg by mouth daily.  docusate sodium 100 MG capsule  Commonly known as:  COLACE  Take 100 mg by mouth 2 (two) times daily as needed for mild constipation.     furosemide 40 MG tablet  Commonly known as:  LASIX  Take 1 tablet (40 mg total) by mouth daily.     levonorgestrel 20 MCG/24HR IUD  Commonly known as:  MIRENA  1 each by Intrauterine route once.     levothyroxine 50 MCG tablet  Commonly known as:  SYNTHROID, LEVOTHROID  TAKE ONE TABLET BY MOUTH ONCE DAILY BEFORE BREAKFAST     magnesium gluconate 500 MG tablet  Commonly known as:  MAGONATE  Take 250 mg by mouth 2 (two) times daily.     metoprolol succinate 25 MG 24 hr tablet  Commonly known as:  TOPROL-XL  Take 1 tablet (25 mg total) by mouth 2 (two) times daily.     oxyCODONE-acetaminophen 5-325 MG per tablet  Commonly  known as:  PERCOCET/ROXICET  Take 1-2 tablets by mouth every 4 (four) hours as needed for moderate pain.     PATANASE 0.6 % Soln  Generic drug:  Olopatadine HCl  Place 1 puff into the nose 2 (two) times daily.     Potassium Gluconate 595 MG Caps  Take 595 mg by mouth daily. OK to take extra on days you need extra lasix     ranolazine 1000 MG SR tablet  Commonly known as:  RANEXA  Take 1 tablet (1,000 mg total) by mouth 2 (two) times daily.     sertraline 50 MG tablet  Commonly known as:  ZOLOFT  Take 1 tablet (50 mg total) by mouth daily.        Disposition:  Discharge Instructions    Diet - low sodium heart healthy    Complete by:  As directed      Increase activity slowly    Complete by:  As directed           Follow-up Information    Follow up with Lewayne BuntingGregg Taylor, MD.   Specialty:  Cardiology   Why:  The office will call   Contact information:   [address] [place] [address] [phone]       Duration of Discharge Encounter: Greater than 30 minutes including physician time.  Signed,

## 2014-09-02 NOTE — Plan of Care (Signed)
Problem: Phase I Progression Outcomes Goal: OOB as tolerated unless otherwise ordered Outcome: Completed/Met Date Met:  09/02/14 Goal: Initial discharge plan identified Outcome: Completed/Met Date Met:  09/02/14 Goal: Other Phase I Outcomes/Goals Outcome: Completed/Met Date Met:  09/02/14  Problem: Phase II Progression Outcomes Goal: Vital signs remain stable Outcome: Completed/Met Date Met:  09/02/14 Goal: IV changed to normal saline lock Outcome: Completed/Met Date Met:  09/02/14 Goal: Obtain order to discontinue catheter if appropriate Outcome: Not Applicable Date Met:  28/11/88

## 2014-09-02 NOTE — Progress Notes (Signed)
Patient ID: Karen Mannindy A Nuckles, female   DOB: 11/19/1970, 43 y.o.   MRN: [id number]    Patient Name: [name] Date of Encounter: 09/02/2014     Principal Problem:   ICD (implantable cardioverter-defibrillator) discharge Active Problems:   Depression with anxiety   Asthma   Hypertrophic cardiomyopathy- s/p myomectomy   VT (ventricular tachycardia)   Chronic anticoagulation   Hypothyroidism   ICD (implantable cardioverter-defibrillator) in place   Atrial flutter    SUBJECTIVE  No chest pain or sob.   CURRENT MEDS . apixaban  5 mg Oral BID  . aspirin EC  81 mg Oral Daily  . fluticasone  1 puff Inhalation BID  . furosemide  40 mg Oral Daily  . gabapentin  600 mg Oral TID  . levothyroxine  50 mcg Oral QAC breakfast  . loratadine  10 mg Oral Daily  . magnesium oxide  200 mg Oral BID  . metoprolol succinate  25 mg Oral Q0600  . mexiletine  300 mg Oral TID  . Olopatadine HCl  1 puff Nasal BID  . potassium chloride  10 mEq Oral Daily  . ranolazine  1,000 mg Oral BID  . sertraline  50 mg Oral Daily    OBJECTIVE  Filed Vitals:   09/01/14 2031 09/01/14 2112 09/02/14 0555 09/02/14 0721  BP: 104/64  100/66 108/62  Pulse: 74   75  Temp: 98.9 F (37.2 C)   98.9 F (37.2 C)  TempSrc: Oral   Oral  Resp: 18   18  Height:      Weight:    221 lb 9.6 oz (100.517 kg)  SpO2: 98% 97%  98%    Intake/Output Summary (Last 24 hours) at 09/02/14 0812 Last data filed at 09/01/14 2000  Gross per 24 hour  Intake    240 ml  Output      0 ml  Net    240 ml   Filed Weights   08/31/14 2122 09/01/14 0435 09/02/14 0721  Weight: 221 lb 6.4 oz (100.426 kg) 221 lb (100.245 kg) 221 lb 9.6 oz (100.517 kg)    PHYSICAL EXAM  General: Pleasant, NAD. Neuro: Alert and oriented X 3. Moves all extremities spontaneously. Psych: Normal affect. HEENT:  Normal  Neck: Supple without bruits or JVD. Lungs:  Resp regular and unlabored, CTA. Heart: RRR no s3, s4, or murmurs. Abdomen: Soft,  non-tender, non-distended, BS + x 4.  Extremities: No clubbing, cyanosis or edema. DP/PT/Radials 2+ and equal bilaterally.  Accessory Clinical Findings  CBC  Recent Labs  08/31/14 1756  WBC 7.9  HGB 13.7  HCT 42.6  MCV 94.0  PLT 298   Basic Metabolic Panel  Recent Labs  08/31/14 1204 08/31/14 1756 09/01/14 0406  NA 137 141 139  K 4.2 4.3 3.9  CL 101 101 103  CO2 31 26 24   GLUCOSE 90 104* 95  BUN 11 12 11   CREATININE 0.83 0.76 0.76  CALCIUM 9.2 9.0 8.6  MG 1.8 2.1  --    Liver Function Tests No results for input(s): AST, ALT, ALKPHOS, BILITOT, PROT, ALBUMIN in the last 72 hours. No results for input(s): LIPASE, AMYLASE in the last 72 hours. Cardiac Enzymes No results for input(s): CKTOTAL, CKMB, CKMBINDEX, TROPONINI in the last 72 hours. BNP Invalid input(s): POCBNP D-Dimer No results for input(s): DDIMER in the last 72 hours. Hemoglobin A1C No results for input(s): HGBA1C in the last 72 hours. Fasting Lipid Panel No results for input(s): CHOL, HDL, LDLCALC,  TRIG, CHOLHDL, LDLDIRECT in the last 72 hours. Thyroid Function Tests  Recent Labs  08/31/14 1204  TSH 3.211    TELE  nsr with atrial pacing and occaisional PVC's.  Radiology/Studies  Dg Chest Port 1 View  08/31/2014   CLINICAL DATA:  AICD problem  EXAM: PORTABLE CHEST - 1 VIEW  COMPARISON:  01/19/2014  FINDINGS: Cardiomediastinal silhouette is stable. Three leads cardiac pacemaker is unchanged in position. No acute infiltrate or pulmonary edema. No pneumothorax.  IMPRESSION: No active disease. Stable 3 leads cardiac pacemaker position. No pneumothorax.   Electronically Signed   By: Natasha MeadLiviu  Pop M.D.   On: 08/31/2014 18:11    ASSESSMENT AND PLAN  1. Recurrent atrial tachy with 1:1 AV conduction 2. HCM, s/p myomectomy 3. VT/VF, s/p ICD Rec: ok for discharge. Her ICD has been reprogrammed. I would like her metoprolol to be increased to 25 mg twice daily. Followup with me in 2-3 weeks.  Gregg  Taylor,M.D.  Gregg Taylor,M.D.  09/02/2014 8:12 AM

## 2014-09-03 ENCOUNTER — Telehealth: Payer: Self-pay | Admitting: Cardiology

## 2014-09-03 NOTE — Telephone Encounter (Signed)
Pt called complaining of palpitations. Discussed with Dr Ladona Ridgelaylor- increase Toprol to 50 mg BID with an extra 50 mg prn for breakthrough.  [name] PA-C 09/03/2014 8:34 AM

## 2014-09-05 ENCOUNTER — Encounter (HOSPITAL_COMMUNITY): Payer: BC Managed Care – PPO

## 2014-09-05 ENCOUNTER — Telehealth (HOSPITAL_COMMUNITY): Payer: Self-pay | Admitting: *Deleted

## 2014-09-05 ENCOUNTER — Telehealth: Payer: Self-pay | Admitting: *Deleted

## 2014-09-05 NOTE — Telephone Encounter (Signed)
Pt thought she was shocked earlier this a.m. Latitude shows no new arrhythmias in timeframe pt referenced. Pt did have 2 ATR episodes at 23:16 & 23:54 last night. Pt was shocked last Wed, Dr. Ladona Ridgelaylor aware.   Pt aware ROV w/ Dr. Ladona Ridgelaylor 09/27/14.

## 2014-09-05 NOTE — Telephone Encounter (Signed)
Called to heck on pt regarding status and condition.  Pt is awaiting call back from Dr. Ladona Ridgelaylor in regards to "racing heart rate" over the weekend and possible ICD firing.  Pt informed by cardiology office that interrogation showed no firing.  Pt is on driving restrictions and will not be able to attend cardiac rehab.  Pt advised that rehab staff was waiting on clearance before she may return.  Office visit scheduled for 12/29 which pt hopes something will open up sooner.  Pt advised to call rehab if any other assistance is needed.  Pt verbalized understanding. Karlene Linemanarlette Talha Iser RN BSN

## 2014-09-07 ENCOUNTER — Encounter: Payer: Self-pay | Admitting: Cardiology

## 2014-09-07 ENCOUNTER — Other Ambulatory Visit: Payer: Self-pay | Admitting: Internal Medicine

## 2014-09-07 ENCOUNTER — Other Ambulatory Visit: Payer: Self-pay | Admitting: Neurology

## 2014-09-07 ENCOUNTER — Encounter (HOSPITAL_COMMUNITY): Payer: BC Managed Care – PPO

## 2014-09-08 ENCOUNTER — Encounter (HOSPITAL_COMMUNITY): Payer: Self-pay | Admitting: Internal Medicine

## 2014-09-09 ENCOUNTER — Encounter (HOSPITAL_COMMUNITY): Payer: BC Managed Care – PPO

## 2014-09-12 ENCOUNTER — Encounter (HOSPITAL_COMMUNITY): Payer: BC Managed Care – PPO

## 2014-09-13 ENCOUNTER — Encounter: Payer: Self-pay | Admitting: Internal Medicine

## 2014-09-13 ENCOUNTER — Telehealth (HOSPITAL_COMMUNITY): Payer: Self-pay | Admitting: *Deleted

## 2014-09-14 ENCOUNTER — Encounter (HOSPITAL_COMMUNITY): Payer: BC Managed Care – PPO

## 2014-09-16 ENCOUNTER — Encounter (HOSPITAL_COMMUNITY): Payer: BC Managed Care – PPO

## 2014-09-16 ENCOUNTER — Telehealth (HOSPITAL_COMMUNITY): Payer: Self-pay | Admitting: *Deleted

## 2014-09-16 NOTE — Telephone Encounter (Signed)
-----   Message from Marinus MawGregg W Taylor, MD sent at 09/15/2014 10:51 PM EST ----- Regarding: RE: Return to cardiac rehab He is not supposed to drive for 6 months following ICD shock. GT ----- Message -----    From: Chelsea Ausarlette B Donshay Lupinski, RN    Sent: 09/07/2014   9:06 AM      To: Marinus MawGregg W Taylor, MD Subject: Return to cardiac rehab                         Received notification from Gladstone LighterMaria Whitaker RN regarding pt ok to return to rehab.  Contacted pt.  Pt unable to return to rehab due to driving restrictions reinstated due to ICD firing.  Pt does not have anyone who can bring her to exercise classes.  Is there a time limitation for the driving restriction or will this be addressed with her her follow up appt with you later this month?  Thanks for the advisement this will help us determine if pt should be discharged or placed on medical hold if the restriction is for a short period of time.   Karlene Linemanarlette Macoy Rodwell RN

## 2014-09-19 ENCOUNTER — Encounter (HOSPITAL_COMMUNITY): Payer: BC Managed Care – PPO

## 2014-09-21 ENCOUNTER — Encounter (HOSPITAL_COMMUNITY): Payer: BC Managed Care – PPO

## 2014-09-26 ENCOUNTER — Encounter (HOSPITAL_COMMUNITY): Payer: BC Managed Care – PPO

## 2014-09-27 ENCOUNTER — Encounter: Payer: Self-pay | Admitting: Internal Medicine

## 2014-09-27 ENCOUNTER — Other Ambulatory Visit: Payer: Self-pay

## 2014-09-27 ENCOUNTER — Ambulatory Visit (INDEPENDENT_AMBULATORY_CARE_PROVIDER_SITE_OTHER): Payer: BC Managed Care – PPO | Admitting: Internal Medicine

## 2014-09-27 VITALS — BP 128/64 | HR 75 | Ht 60.0 in | Wt 224.8 lb

## 2014-09-27 DIAGNOSIS — I471 Supraventricular tachycardia: Secondary | ICD-10-CM

## 2014-09-27 DIAGNOSIS — I472 Ventricular tachycardia, unspecified: Secondary | ICD-10-CM

## 2014-09-27 DIAGNOSIS — Z95810 Presence of automatic (implantable) cardiac defibrillator: Secondary | ICD-10-CM

## 2014-09-27 DIAGNOSIS — E669 Obesity, unspecified: Secondary | ICD-10-CM

## 2014-09-27 LAB — MDC_IDC_ENUM_SESS_TYPE_INCLINIC
Brady Statistic RA Percent Paced: 82 %
Brady Statistic RV Percent Paced: 1 % — CL
HIGH POWER IMPEDANCE MEASURED VALUE: 53 Ohm
HighPow Impedance: 41 Ohm
Lead Channel Impedance Value: 511 Ohm
Lead Channel Impedance Value: 582 Ohm
Lead Channel Pacing Threshold Amplitude: 0.7 V
Lead Channel Pacing Threshold Amplitude: 0.8 V
Lead Channel Pacing Threshold Pulse Width: 0.5 ms
Lead Channel Pacing Threshold Pulse Width: 0.5 ms
Lead Channel Sensing Intrinsic Amplitude: 18.8 mV
Lead Channel Setting Pacing Amplitude: 2 V
Lead Channel Setting Pacing Pulse Width: 0.5 ms
MDC IDC MSMT LEADCHNL RA SENSING INTR AMPL: 6.5 mV
MDC IDC PG SERIAL: 102591
MDC IDC SESS DTM: 20151229050000
MDC IDC SET LEADCHNL RV PACING AMPLITUDE: 2.4 V
MDC IDC SET LEADCHNL RV SENSING SENSITIVITY: 0.6 mV
Zone Setting Detection Interval: 250 ms
Zone Setting Detection Interval: 286 ms

## 2014-09-27 NOTE — Assessment & Plan Note (Signed)
She is gaining weight. I've encouraged her to try to eat less and restart exercising , and will re-refer the patient to cardiac rehabilitation. I would like her to start exercising in cardiac rehabilitation.

## 2014-09-27 NOTE — Telephone Encounter (Signed)
Pt left v/m requesting refill alprazolam to [facility] [address].Please advise.

## 2014-09-27 NOTE — Assessment & Plan Note (Signed)
Her [employer] ICD is working normally. We'll plan to recheck in several months. 

## 2014-09-27 NOTE — Assessment & Plan Note (Signed)
Her ventricular tachycardia is currently quiet. She will continue her current medical therapy.

## 2014-09-27 NOTE — Assessment & Plan Note (Signed)
She will continue her current dose of medical therapy with metoprolol 50 mg twice daily. We discussed other treatments including AV node ablation and insertion of a biventricular ICD. Hopefully her medications will control her arrhythmia and she will have no additional ICD shocks.

## 2014-09-27 NOTE — Patient Instructions (Signed)
Your physician wants you to follow-up in: 6 months with Dr Court Joyaylor You will receive a reminder letter in the mail two months in advance. If you don't receive a letter, please call our office to schedule the follow-up appointment.   Remote monitoring is used to monitor your Pacemaker or ICD from home. This monitoring reduces the number of office visits required to check your device to one time per year. It allows us to keep an eye on the functioning of your device to ensure it is working properly. You are scheduled for a device check from home on 12/27/14. You may send your transmission at any time that day. If you have a wireless device, the transmission will be sent automatically. After your physician reviews your transmission, you will receive a postcard with your next transmission date.

## 2014-09-27 NOTE — Progress Notes (Signed)
HPI [name] returns today for followup. She is a very pleasant 43 yo woman with HCM, s/p myomectomy, VT/VF who has been on multiple medications and had multiple ICD shocks. She came in several weeks ago with a 1:1 tachycardia and was found to have atrial tachy with an RVR. She has had her dose of metoprolol uptitrated to 50 mg twice daily.  Her heart failure is class 2.  She is appropriately anxious. She has gone back to walking. She would like to restart cardiac rehab. Allergies  Allergen Reactions  . Amiodarone Anaphylaxis and Nausea And Vomiting    Amiodarone tablets cause nausea Can tolerate IV Amiodarone  . Phenergan [Promethazine Hcl] Itching  . Warfarin And Related Other (See Comments)    'burns my skin'  . Nitroglycerin Other (See Comments)    Hypotension     Current Outpatient Prescriptions  Medication Sig Dispense Refill  . acetaminophen (TYLENOL) 500 MG tablet Take 500-1,000 mg by mouth 2 (two) times daily as needed for mild pain, moderate pain, fever or headache (pain).     Marland Kitchen. albuterol (PROVENTIL HFA;VENTOLIN HFA) 108 (90 BASE) MCG/ACT inhaler Inhale 1 puff into the lungs every 4 (four) hours as needed for wheezing or shortness of breath.     . ALPRAZolam (XANAX) 1 MG tablet Take 1 tablet (1 mg total) by mouth 3 (three) times daily as needed. (Patient taking differently: Take 1 mg by mouth 3 (three) times daily as needed for anxiety. ) 70 tablet 0  . amoxicillin (AMOXIL) 500 MG capsule Take 2,000 mg by mouth once. For dental procedure  0  . apixaban (ELIQUIS) 5 MG TABS tablet Take 1 tablet (5 mg total) by mouth 2 (two) times daily. (Patient taking differently: Take 5 mg by mouth 2 (two) times daily. 6am and 6pm) 60 tablet 8  . beclomethasone (QVAR) 80 MCG/ACT inhaler Inhale 1 puff into the lungs 2 (two) times daily. 9am and 8pm    . cetirizine (ZYRTEC) 10 MG tablet Take 10 mg by mouth daily.     Marland Kitchen. docusate sodium (COLACE) 100 MG capsule Take 100 mg by mouth 2 (two) times daily  as needed for mild constipation.    . furosemide (LASIX) 40 MG tablet Take 1 tablet (40 mg total) by mouth daily. 30 tablet 3  . gabapentin (NEURONTIN) 300 MG capsule TAKE TWO CAPSULES BY MOUTH THREE TIMES DAILY 180 capsule 0  . levonorgestrel (MIRENA) 20 MCG/24HR IUD 1 each by Intrauterine route once.    Marland Kitchen. levothyroxine (SYNTHROID, LEVOTHROID) 50 MCG tablet TAKE ONE TABLET BY MOUTH ONCE DAILY BEFORE BREAKFAST 30 tablet 3  . magnesium gluconate (MAGONATE) 500 MG tablet Take 250 mg by mouth 2 (two) times daily.    . metoprolol succinate (TOPROL-XL) 25 MG 24 hr tablet Take 1 tablet (25 mg total) by mouth 2 (two) times daily. 60 tablet 8  . mexiletine (MEXITIL) 150 MG capsule TAKE TWO CAPSULES BY MOUTH THREE TIMES DAILY 180 capsule 0  . Olopatadine HCl (PATANASE) 0.6 % SOLN Place 1 puff into the nose 2 (two) times daily.    Marland Kitchen. oxyCODONE-acetaminophen (PERCOCET/ROXICET) 5-325 MG per tablet Take 1-2 tablets by mouth every 4 (four) hours as needed for moderate pain. 40 tablet 0  . Potassium Gluconate 595 MG CAPS Take 595 mg by mouth daily. OK to take extra on days you need extra lasix    . ranolazine (RANEXA) 1000 MG SR tablet Take 1 tablet (1,000 mg total) by mouth 2 (two) times daily.  180 tablet 3  . sertraline (ZOLOFT) 50 MG tablet Take 1 tablet (50 mg total) by mouth daily. 30 tablet 3   No current facility-administered medications for this visit.     Past Medical History  Diagnosis Date  . Hypertrophic cardiomyopathy     s/p septal myomectomy with implantable defibrillator 01/27/2012   . Anxiety state, unspecified   . Allergic rhinitis     to dogs  . Asthma   . Anemia     during pregnancy  . History of chicken pox   . Generalized headaches   . Ocular migraine     no HA  . Back pain     told cracked bone, vicodin didn't help, improved with percocets/muscle relaxants, no eval by prior PCP  . 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  DVT (deep venous thrombosis) 01/2012    was placed on Warfarin after cardiac surgery but had skin reaction to warfarin so was changed to Pradaxa so DVT occurred while on Pradaxa although unclear if it occurred during transition  . Hypotension   . Atrial fibrillation     a. On pradaxa.  coumadin necrosis with warfarin;  b. Amio d/c'd 02/2012  . Pericardial effusion   . Chronic combined systolic and diastolic CHF (congestive heart failure)     a. echo 03/19/12: severe asymmetric septal hypertrophy, evidence of upper septal myomectomy, peak LV outflow tract gradient 35, EF 35%,;  b.  Echo 9/13: Severe LVH, EF 30-35%, anteroseptal and inferior HK, LVOT narrow, mild AI, mild to moderate mitral stenosis, severe LAE  . Pericardial tamponade 03/14/2012  . 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 01/2012 s/p BSX ICD;  b. 06/2012 Multaq initiated due to recurrent syncope, VT/VF - had severe n/v with IV amio.  . Skin necrosis --COUMADIN   . Chronic chest wall pain     eval by [facility], stable, [facility]  . Syncope     a. in setting of VT/VF.  Marland Kitchen. Hypothyroid   . Blood transfusion without reported diagnosis   . Neuromuscular disorder   . Gall stones   . Atrial flutter 10/2013    with inappropriate ICD therapy; s/p CTI ablation by Dr Ladona Ridgelaylor 11/16/2013  . PONV (postoperative nausea and vomiting)   . Depression   . Post-traumatic stress   . Fibromyalgia     ROS:   All systems reviewed and negative except as noted in the HPI.   Past Surgical History  Procedure Laterality Date  . Cesarean section  1991  . Induced abortion  1990 and 1993  . Cardiac surgery  01/27/2012    median sternotomy, septal myectomy, MVR - [facility] (Dr. Silvestre MesiGlower)  . Cardiac defibrillator placement  01/2012    BSX dual chamber ICD  . Myomectomy    . Pericardial window  03/14/2012    Procedure: PERICARDIAL WINDOW;  Surgeon: Purcell Nailslarence H Owen, MD;  [facility];  Service: Open Heart Surgery;  Laterality: N/A;   Subxyphoid Pericardial  Window   . Ablation  11-16-13    RFCA of atrial flutter by Dr Ladona Ridgelaylor   . Insert / replace / remove pacemaker    . Mitral valve repair    . Cholecystectomy N/A 01/26/2014    LAPAROSCOPIC CHOLECYSTECTOMY WITH INTRAOPERATIVE CHOLANGIOGRAM;  Surgeon: Wilmon ArmsMatthew K. Tsuei, MD  . Atrial flutter ablation N/A 11/16/2013    Procedure: ATRIAL FLUTTER ABLATION;  Surgeon:  Marinus Maw, MD;  Location: [facility] CATH LAB;  Service: Cardiovascular;  Laterality: N/A;     Family History  Problem Relation Age of Onset  . Diabetes Father   . Heart disease Father     unspecified heart problem  . Thyroid disease Mother   . Anemia Mother     mediterranean anemia, ITP  . Heart disease Maternal Grandfather     CHF  . Hyperlipidemia Maternal Grandfather   . Diabetes Paternal Grandfather   . Cancer Paternal Grandfather   . Coronary artery disease Neg Hx   . Stroke Neg Hx   . Sudden death Neg Hx      History   Social History  . Marital Status: Single    Spouse Name: N/A    Number of Children: 1  . Years of Education: N/A   Occupational History  . Naval architect    Social History Main Topics  . Smoking status: Former Smoker -- 1.00 packs/day for 3 years    Types: Cigarettes    Quit date: 09/30/1988  . Smokeless tobacco: Never Used     Comment: quitin 1990  . Alcohol Use: No     Comment: none since "months" as of 02/28/2013  . Drug Use: No     Comment: quiti n 1989  . Sexual Activity: Yes    Birth Control/ Protection: Pill   Other Topics Concern  . Not on file   Social History Narrative   Caffeine: 1 cup coffee/day   Divorced, 1 child.  lives with boyfriend, dogs   Occupation: Full time Tax adviser   Activity: walks 51min/day   Diet: fruits/vegetables daily, water, no fish     BP 128/64 mmHg  Pulse 75  Ht 5' (1.524 m)  Wt 224 lb 12.8 oz (101.969 kg)  BMI 43.90 kg/m2  Physical Exam:  Stable appearing obese, 43 year-old woman, NAD HEENT:  Unremarkable Neck:  7 cm JVD, no thyromegally Back:  No CVA tenderness Lungs:  Clear wwith no wheezes, rales, or rhonchi HEART:  Regular rate rhythm, no murmurs, no rubs, no clicks Abd:  soft, positive bowel sounds, no organomegally, no rebound, no guarding Ext:  2 plus pulses, no edema, no cyanosis, no clubbing Skin:  No rashes no nodules Neuro:  CN II through XII intact, motor grossly intact   Assess/Plan:

## 2014-09-28 ENCOUNTER — Encounter (HOSPITAL_COMMUNITY): Payer: BC Managed Care – PPO

## 2014-09-28 MED ORDER — ALPRAZOLAM 1 MG PO TABS: 1.0000 mg | ORAL_TABLET | Freq: Three times a day (TID) | ORAL | Status: DC | PRN

## 2014-09-28 NOTE — Telephone Encounter (Signed)
Rx called in as directed.   

## 2014-09-28 NOTE — Telephone Encounter (Signed)
plz phone in. 

## 2014-09-30 DIAGNOSIS — K760 Fatty (change of) liver, not elsewhere classified: Secondary | ICD-10-CM

## 2014-09-30 HISTORY — DX: Fatty (change of) liver, not elsewhere classified: K76.0

## 2014-10-03 ENCOUNTER — Encounter (HOSPITAL_COMMUNITY): Payer: BC Managed Care – PPO

## 2014-10-05 ENCOUNTER — Encounter (HOSPITAL_COMMUNITY): Payer: BC Managed Care – PPO

## 2014-10-07 ENCOUNTER — Encounter (HOSPITAL_COMMUNITY): Payer: BC Managed Care – PPO

## 2014-10-07 ENCOUNTER — Telehealth: Payer: Self-pay | Admitting: Internal Medicine

## 2014-10-07 ENCOUNTER — Other Ambulatory Visit: Payer: Self-pay | Admitting: Neurology

## 2014-10-07 ENCOUNTER — Other Ambulatory Visit: Payer: Self-pay | Admitting: Internal Medicine

## 2014-10-07 NOTE — Telephone Encounter (Signed)
New message      1. Has your device fired? no 2. Is you device beeping? no 3. Are you experiencing draining or swelling at device site? no  4. Are you calling to see if we received your device transmission? no 5. Have you passed out? No Pt has a defibulator.  She moved to the right side and felt sharp pain near the defibulator site.  The pain is gone when she is not moving that arm.  Please advise

## 2014-10-07 NOTE — Telephone Encounter (Signed)
Pt c/o sharp pain to the R of the device site while she was in the shower while arm raised. She stated the pain was short lived, lasting only a few sec, resolved after she put her arm back to her side. Patient denied any fever, chills, or bruising in the area and states that she felt the pain as more superficial than cardiac/or GI. I explained to patient that the pain was likely nerve or muscle related. I encouraged patient to call back if pain is felt again and if so we can bring her into the ofc to evaluate the area. Patient voiced understanding.

## 2014-10-10 ENCOUNTER — Encounter (HOSPITAL_COMMUNITY): Payer: BC Managed Care – PPO

## 2014-10-11 ENCOUNTER — Emergency Department (HOSPITAL_COMMUNITY): Payer: BLUE CROSS/BLUE SHIELD

## 2014-10-11 ENCOUNTER — Emergency Department (HOSPITAL_COMMUNITY)
Admission: EM | Admit: 2014-10-11 | Discharge: 2014-10-11 | Disposition: A | Discharge status: Home / Self Care | Payer: BLUE CROSS/BLUE SHIELD | Attending: Emergency Medicine | Admitting: Emergency Medicine

## 2014-10-11 ENCOUNTER — Encounter (HOSPITAL_COMMUNITY): Payer: Self-pay | Admitting: *Deleted

## 2014-10-11 DIAGNOSIS — Z792 Long term (current) use of antibiotics: Secondary | ICD-10-CM | POA: Diagnosis not present

## 2014-10-11 DIAGNOSIS — F419 Anxiety disorder, unspecified: Secondary | ICD-10-CM | POA: Insufficient documentation

## 2014-10-11 DIAGNOSIS — Z862 Personal history of diseases of the blood and blood-forming organs and certain disorders involving the immune mechanism: Secondary | ICD-10-CM | POA: Diagnosis not present

## 2014-10-11 DIAGNOSIS — M797 Fibromyalgia: Secondary | ICD-10-CM | POA: Diagnosis not present

## 2014-10-11 DIAGNOSIS — F329 Major depressive disorder, single episode, unspecified: Secondary | ICD-10-CM | POA: Diagnosis not present

## 2014-10-11 DIAGNOSIS — R079 Chest pain, unspecified: Secondary | ICD-10-CM | POA: Diagnosis present

## 2014-10-11 DIAGNOSIS — G43909 Migraine, unspecified, not intractable, without status migrainosus: Secondary | ICD-10-CM | POA: Diagnosis not present

## 2014-10-11 DIAGNOSIS — Z79899 Other long term (current) drug therapy: Secondary | ICD-10-CM | POA: Insufficient documentation

## 2014-10-11 DIAGNOSIS — I5042 Chronic combined systolic (congestive) and diastolic (congestive) heart failure: Secondary | ICD-10-CM | POA: Insufficient documentation

## 2014-10-11 DIAGNOSIS — J45909 Unspecified asthma, uncomplicated: Secondary | ICD-10-CM | POA: Diagnosis not present

## 2014-10-11 DIAGNOSIS — Z7902 Long term (current) use of antithrombotics/antiplatelets: Secondary | ICD-10-CM | POA: Insufficient documentation

## 2014-10-11 DIAGNOSIS — R0602 Shortness of breath: Secondary | ICD-10-CM

## 2014-10-11 DIAGNOSIS — Z87891 Personal history of nicotine dependence: Secondary | ICD-10-CM | POA: Diagnosis not present

## 2014-10-11 DIAGNOSIS — R0789 Other chest pain: Secondary | ICD-10-CM | POA: Insufficient documentation

## 2014-10-11 DIAGNOSIS — I959 Hypotension, unspecified: Secondary | ICD-10-CM | POA: Diagnosis not present

## 2014-10-11 DIAGNOSIS — Z86718 Personal history of other venous thrombosis and embolism: Secondary | ICD-10-CM | POA: Insufficient documentation

## 2014-10-11 DIAGNOSIS — E039 Hypothyroidism, unspecified: Secondary | ICD-10-CM | POA: Diagnosis not present

## 2014-10-11 DIAGNOSIS — I4891 Unspecified atrial fibrillation: Secondary | ICD-10-CM | POA: Insufficient documentation

## 2014-10-11 LAB — BASIC METABOLIC PANEL
ANION GAP: 9 (ref 5–15)
BUN: 17 mg/dL (ref 6–23)
CALCIUM: 9.7 mg/dL (ref 8.4–10.5)
CO2: 27 mmol/L (ref 19–32)
Chloride: 101 mEq/L (ref 96–112)
Creatinine, Ser: 0.9 mg/dL (ref 0.50–1.10)
GFR, EST AFRICAN AMERICAN: 89 mL/min — AB (ref >90)
GFR, EST NON AFRICAN AMERICAN: 77 mL/min — AB (ref >90)
Glucose, Bld: 87 mg/dL (ref 70–99)
Potassium: 4.9 mmol/L (ref 3.5–5.1)
SODIUM: 137 mmol/L (ref 135–145)

## 2014-10-11 LAB — CBC
HEMATOCRIT: 42.1 % (ref 36.0–46.0)
Hemoglobin: 14 g/dL (ref 12.0–15.0)
MCH: 30.8 pg (ref 26.0–34.0)
MCHC: 33.3 g/dL (ref 30.0–36.0)
MCV: 92.5 fL (ref 78.0–100.0)
Platelets: 283 10*3/uL (ref 150–400)
RBC: 4.55 MIL/uL (ref 3.87–5.11)
RDW: 12.8 % (ref 11.5–15.5)
WBC: 5 10*3/uL (ref 4.0–10.5)

## 2014-10-11 LAB — I-STAT TROPONIN, ED: TROPONIN I, POC: 0.02 ng/mL (ref 0.00–0.08)

## 2014-10-11 LAB — BRAIN NATRIURETIC PEPTIDE: B NATRIURETIC PEPTIDE 5: 130.6 pg/mL — AB (ref 0.0–100.0)

## 2014-10-11 MED ORDER — ALBUTEROL SULFATE HFA 108 (90 BASE) MCG/ACT IN AERS
2.0000 | INHALATION_SPRAY | Freq: Once | RESPIRATORY_TRACT | Status: AC
  Administered 2014-10-11: 2 via RESPIRATORY_TRACT
  Filled 2014-10-11: qty 6.7

## 2014-10-11 MED ORDER — OXYCODONE-ACETAMINOPHEN 5-325 MG PO TABS
2.0000 | ORAL_TABLET | Freq: Once | ORAL | Status: AC
  Administered 2014-10-11: 2 via ORAL
  Filled 2014-10-11: qty 2

## 2014-10-11 NOTE — ED Notes (Addendum)
Patient has defibrilator in the left chest.  She states she noticed some pain around the site 2 days ago and today the pain is constant.   She states she is having some sharp pain.  Patient states she has had sob.  Patient has hx of asthma.  She states she has not felt her defib fire.  Patient is alert.  Skin warm and dry.  patien is seen by Dr Excell Seltzerooper.  Dr Ladona Ridgelaylor sees her for EPT.   Patient has defib due to hx of vfib/vtach and afib.   She was here in Dec due to defib fire and they made adjustments then.  Patient's device is [employer]

## 2014-10-11 NOTE — ED Provider Notes (Addendum)
CSN: [id number]     Arrival date & time 10/11/14  1410 History   First MD Initiated Contact with Patient 10/11/14 1543     Chief Complaint  Patient presents with  . Chest Pain     (Consider location/radiation/quality/duration/timing/severity/associated sxs/prior Treatment) Patient is a 44 y.o. female presenting with chest pain. The history is provided by the patient. No language interpreter was used.  Chest Pain Pain location:  R chest Pain quality: sharp   Pain radiates to:  Does not radiate Pain radiates to the back: no   Pain severity:  Moderate Onset quality:  Sudden Duration:  3 days Timing:  Intermittent Progression:  Waxing and waning Chronicity:  New Context: at rest   Relieved by:  Nothing Worsened by:  Nothing tried Ineffective treatments:  None tried Associated symptoms: lower extremity edema ( chronic unchanged ankle swelling) and shortness of breath ( today)   Associated symptoms: no abdominal pain, no back pain, no cough, no diaphoresis, no fatigue, no fever, no headache, no nausea, no numbness, no palpitations, not vomiting and no weakness   Risk factors: prior DVT/PE   Risk factors: no smoking   Risk factors comment:  Asthma   Past Medical History  Diagnosis Date  . Hypertrophic cardiomyopathy     s/p septal myomectomy with implantable defibrillator 01/27/2012   . Anxiety state, unspecified   . Allergic rhinitis     to dogs  . Asthma   . Anemia     during pregnancy  . History of chicken pox   . Generalized headaches   . Ocular migraine     no HA  . Back pain     told cracked bone, vicodin didn't help, improved with percocets/muscle relaxants, no eval by prior PCP  . 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 DVT (deep venous thrombosis) 01/2012    was placed on Warfarin after cardiac surgery but had skin reaction to warfarin so was changed to Pradaxa so DVT occurred while on Pradaxa although unclear if  it occurred during transition  . Hypotension   . Atrial fibrillation     a. On pradaxa.  coumadin necrosis with warfarin;  b. Amio d/c'd 02/2012  . Pericardial effusion   . Chronic combined systolic and diastolic CHF (congestive heart failure)     a. echo 03/19/12: severe asymmetric septal hypertrophy, evidence of upper septal myomectomy, peak LV outflow tract gradient 35, EF 35%,;  b.  Echo 9/13: Severe LVH, EF 30-35%, anteroseptal and inferior HK, LVOT narrow, mild AI, mild to moderate mitral stenosis, severe LAE  . Pericardial tamponade 03/14/2012  . 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 01/2012 s/p BSX ICD;  b. 06/2012 Multaq initiated due to recurrent syncope, VT/VF - had severe n/v with IV amio.  . Skin necrosis --COUMADIN   . Chronic chest wall pain     eval by CVTS, stable, [facility]  . Syncope     a. in setting of VT/VF.  Marland Kitchen. Hypothyroid   . Blood transfusion without reported diagnosis   . Neuromuscular disorder   . Gall stones   . Atrial flutter 10/2013    with inappropriate ICD therapy; s/p CTI ablation by Dr Ladona Ridgelaylor 11/16/2013  . PONV (postoperative nausea and vomiting)   . Depression   . Post-traumatic stress   . Fibromyalgia    Past Surgical History  Procedure Laterality Date  .  Cesarean section  1991  . Induced abortion  1990 and 1993  . Cardiac surgery  01/27/2012    median sternotomy, septal myectomy, MVR - [facility] (Dr. Silvestre Mesi)  . Cardiac defibrillator placement  01/2012    BSX dual chamber ICD  . Myomectomy    . Pericardial window  03/14/2012    Procedure: PERICARDIAL WINDOW;  Surgeon: Purcell Nails, MD;  Location: [facility] OR;  Service: Open Heart Surgery;  Laterality: N/A;  Subxyphoid Pericardial  Window   . Ablation  11-16-13    RFCA of atrial flutter by Dr Ladona Ridgel   . Insert / replace / remove pacemaker    . Mitral valve repair    . Cholecystectomy N/A 01/26/2014    LAPAROSCOPIC CHOLECYSTECTOMY WITH INTRAOPERATIVE CHOLANGIOGRAM;  Surgeon:  Wilmon Arms. Tsuei, MD  . Atrial flutter ablation N/A 11/16/2013    Procedure: ATRIAL FLUTTER ABLATION;  Surgeon: Marinus Maw, MD;  Location: [facility] CATH LAB;  Service: Cardiovascular;  Laterality: N/A;   Family History  Problem Relation Age of Onset  . Diabetes Father   . Heart disease Father     unspecified heart problem  . Thyroid disease Mother   . Anemia Mother     mediterranean anemia, ITP  . Heart disease Maternal Grandfather     CHF  . Hyperlipidemia Maternal Grandfather   . Diabetes Paternal Grandfather   . Cancer Paternal Grandfather   . Coronary artery disease Neg Hx   . Stroke Neg Hx   . Sudden death Neg Hx    History  Substance Use Topics  . Smoking status: Former Smoker -- 1.00 packs/day for 3 years    Types: Cigarettes    Quit date: 09/30/1988  . Smokeless tobacco: Never Used     Comment: quitin 1990  . Alcohol Use: No     Comment: none since "months" as of 02/28/2013   OB History    No data available     Review of Systems  Constitutional: Negative for fever, chills, diaphoresis, activity change, appetite change and fatigue.  HENT: Negative for congestion, facial swelling, rhinorrhea and sore throat.   Eyes: Negative for photophobia and discharge.  Respiratory: Positive for shortness of breath ( today). Negative for cough and chest tightness.   Cardiovascular: Positive for chest pain. Negative for palpitations and leg swelling.  Gastrointestinal: Negative for nausea, vomiting, abdominal pain and diarrhea.  Endocrine: Negative for polydipsia and polyuria.  Genitourinary: Negative for dysuria, frequency, difficulty urinating and pelvic pain.  Musculoskeletal: Negative for back pain, arthralgias, neck pain and neck stiffness.  Skin: Negative for color change and wound.  Allergic/Immunologic: Negative for immunocompromised state.  Neurological: Negative for facial asymmetry, weakness, numbness and headaches.  Hematological: Does not bruise/bleed easily.    Psychiatric/Behavioral: Negative for confusion and agitation.      Allergies  Amiodarone; Phenergan; Warfarin and related; and Nitroglycerin  Home Medications   Prior to Admission medications   Medication Sig Start Date End Date Taking? Authorizing Provider  acetaminophen (TYLENOL) 500 MG tablet Take 500-1,000 mg by mouth 2 (two) times daily as needed for mild pain, moderate pain, fever or headache (pain).    Yes Historical Provider, MD  albuterol (PROVENTIL HFA;VENTOLIN HFA) 108 (90 BASE) MCG/ACT inhaler Inhale 1 puff into the lungs every 4 (four) hours as needed for wheezing or shortness of breath.    Yes Historical Provider, MD  ALPRAZolam Prudy Feeler) 1 MG tablet Take 1 tablet (1 mg total) by mouth 3 (three) times daily as needed  for anxiety. 09/28/14  Yes Eustaquio Boyden, MD  apixaban (ELIQUIS) 5 MG TABS tablet Take 1 tablet (5 mg total) by mouth 2 (two) times daily. Patient taking differently: Take 5 mg by mouth 2 (two) times daily. 6am and 6pm 05/16/14  Yes Marinus Maw, MD  beclomethasone (QVAR) 80 MCG/ACT inhaler Inhale 1 puff into the lungs 2 (two) times daily. 9am and 8pm   Yes Historical Provider, MD  cetirizine (ZYRTEC) 10 MG tablet Take 10 mg by mouth daily.    Yes Historical Provider, MD  docusate sodium (COLACE) 100 MG capsule Take 100 mg by mouth 2 (two) times daily as needed for mild constipation.   Yes Historical Provider, MD  furosemide (LASIX) 40 MG tablet Take 1 tablet (40 mg total) by mouth daily. 09/02/14  Yes Rhonda G Barrett, PA-C  gabapentin (NEURONTIN) 300 MG capsule TAKE TWO CAPSULES BY MOUTH THREE TIMES DAILY 10/09/14  Yes Adam Gus Rankin, DO  levonorgestrel (MIRENA) 20 MCG/24HR IUD 1 each by Intrauterine route once.   Yes Historical Provider, MD  levothyroxine (SYNTHROID, LEVOTHROID) 50 MCG tablet TAKE ONE TABLET BY MOUTH ONCE DAILY BEFORE BREAKFAST 08/08/14  Yes Eustaquio Boyden, MD  magnesium gluconate (MAGONATE) 500 MG tablet Take 250 mg by mouth 2 (two) times  daily.   Yes Historical Provider, MD  metoprolol succinate (TOPROL-XL) 25 MG 24 hr tablet Take 1 tablet (25 mg total) by mouth 2 (two) times daily. 09/02/14 09/02/15 Yes Rhonda G Barrett, PA-C  mexiletine (MEXITIL) 150 MG capsule TAKE TWO CAPSULES BY MOUTH THREE TIMES DAILY 10/10/14  Yes Marinus Maw, MD  Olopatadine HCl (PATANASE) 0.6 % SOLN Place 1 puff into the nose 2 (two) times daily.   Yes Historical Provider, MD  oxyCODONE-acetaminophen (PERCOCET/ROXICET) 5-325 MG per tablet Take 1-2 tablets by mouth every 4 (four) hours as needed for moderate pain. 01/28/14  Yes Manus Rudd, MD  Potassium Gluconate 595 MG CAPS Take 595 mg by mouth daily. OK to take extra on days you need extra lasix   Yes Historical Provider, MD  ranolazine (RANEXA) 1000 MG SR tablet Take 1 tablet (1,000 mg total) by mouth 2 (two) times daily. 12/13/13 12/13/14 Yes Micheline Chapman, MD  sertraline (ZOLOFT) 50 MG tablet Take 1 tablet (50 mg total) by mouth daily. 08/09/14  Yes Eustaquio Boyden, MD  amoxicillin (AMOXIL) 500 MG capsule Take 2,000 mg by mouth once. For dental procedure 08/19/14   Historical Provider, MD   BP 104/53 mmHg  Pulse 75  Temp(Src) 98.1 F (36.7 C) (Oral)  Resp 14  Ht 5' (1.524 m)  Wt 220 lb (99.791 kg)  BMI 42.97 kg/m2  SpO2 98% Physical Exam  Constitutional: She is oriented to person, place, and time. She appears well-developed and well-nourished. No distress.  HENT:  Head: Normocephalic and atraumatic.  Mouth/Throat: No oropharyngeal exudate.  Eyes: Pupils are equal, round, and reactive to light.  Neck: Normal range of motion. Neck supple.  Cardiovascular: Normal rate, regular rhythm and normal heart sounds.  Exam reveals no gallop and no friction rub.   No murmur heard. Pulmonary/Chest: Effort normal and breath sounds normal. No respiratory distress. She has no wheezes. She has no rales. She exhibits tenderness.    Abdominal: Soft. Bowel sounds are normal. She exhibits no distension and  no mass. There is no tenderness. There is no rebound and no guarding.  Musculoskeletal: Normal range of motion. She exhibits edema (1+ at ankles). She exhibits no tenderness.  Neurological: She is alert  and oriented to person, place, and time.  Skin: Skin is warm and dry.  Psychiatric: She has a normal mood and affect.    ED Course  Procedures (including critical care time) Labs Review Labs Reviewed  BASIC METABOLIC PANEL - Abnormal; Notable for the following:    GFR calc non Af Amer 77 (*)    GFR calc Af Amer 89 (*)    All other components within normal limits  BRAIN NATRIURETIC PEPTIDE - Abnormal; Notable for the following:    B Natriuretic Peptide 130.6 (*)    All other components within normal limits  CBC  I-STAT TROPOININ, ED    Imaging Review Dg Chest 2 View  10/11/2014   CLINICAL DATA:  Shortness of breath, acute chest pain.  EXAM: CHEST  2 VIEW  COMPARISON:  August 31, 2014.  FINDINGS: The heart size and mediastinal contours are within normal limits. No pneumothorax or pleural effusion is noted. Left-sided pacemaker is unchanged in position. Sternotomy wires are noted. Both lungs are clear. The visualized skeletal structures are unremarkable.  IMPRESSION: No acute cardiopulmonary abnormality seen.   Electronically Signed   By: Roque Lias M.D.   On: 10/11/2014 15:09     EKG Interpretation   Date/Time:  Tuesday October 11 2014 14:15:32 EST Ventricular Rate:  75 PR Interval:  194 QRS Duration: 166 QT Interval:  454 QTC Calculation: 506 R Axis:   35 Text Interpretation:  Electronic atrial pacemaker No significant change  since last tracing Confirmed by DOCHERTY  MD, MEGAN [phone]) on 10/11/2014  6:17:32 PM      MDM   Final diagnoses:  Atypical chest pain    Pt is a 44 y.o. female with Pmhx as above who presents with  Intermittent sharp, chest pain,  Just medial to her defibrillator that started 3 days ago when she reached her left arm across to the right side of  her chest. For the past 2 days.  These pains have been intermittent.  She's had a little bit of shortness of breath today.  No cough.  She's also had chills. She has chronic unchanged symmetric ankle swelling. She's been compliant with her Eliquis.  On physical exam she is mildly hypertensive, which she states is her baseline heart rate is normal.  Respirations are normal.   She has reproducible tenderness,  Just medial to her device.  Lungs are clear.  She does not think her device was fired though it did fire twice in December and parameters were changed.  The pts device was interrogated w/o acute findings, CXR nml w/ nml lead placement. Trop negative. BP at baseline. I do not feel symptoms c/w ACS, PE, pna, no evidence of pericardial effusion/tamponade. I spoke w/ on call cardiology, we agree she is appropriate for outpt f/u with Dr. Ladona Ridgel. I feel pain likely MSK. She agrees to return for worsening symptoms.    Satia A Wermuth evaluation in the Emergency Department is complete. It has been determined that no acute conditions requiring further emergency intervention are present at this time. The patient/guardian have been advised of the diagnosis and plan. We have discussed signs and symptoms that warrant return to the ED, such as changes or worsening in symptoms, worsening pain, worsening SOB, fever, leg swelling.       Toy Cookey, MD 10/11/14 1816  Toy Cookey, MD 10/11/14 [phone]

## 2014-10-11 NOTE — ED Notes (Signed)
Pt acknowledges understanding of DC instructions and acknowledges having all belongings.

## 2014-10-11 NOTE — ED Notes (Signed)
Spoke with [employer] interrogator, he will be coming to interrogate patient defib shortly.

## 2014-10-11 NOTE — ED Notes (Signed)
Pt reports- she was in the shower reaching across with her left arm and felt pain to her left side of chest defib/pacer. States she has not felt it defib. Pt has significant heart history. Pt denies chest pain. MD at bedside evaluating patient. Vitals normal , pt on room air.

## 2014-10-11 NOTE — Discharge Instructions (Signed)

## 2014-10-12 ENCOUNTER — Encounter (HOSPITAL_COMMUNITY): Payer: BC Managed Care – PPO

## 2014-10-13 ENCOUNTER — Encounter (HOSPITAL_COMMUNITY): Payer: Self-pay | Admitting: Internal Medicine

## 2014-10-14 ENCOUNTER — Encounter (HOSPITAL_COMMUNITY): Payer: BC Managed Care – PPO

## 2014-10-14 ENCOUNTER — Other Ambulatory Visit: Payer: Self-pay | Admitting: *Deleted

## 2014-10-14 MED ORDER — METOPROLOL SUCCINATE ER 50 MG PO TB24: 50.0000 mg | ORAL_TABLET | Freq: Every day | ORAL | Status: DC

## 2014-10-17 ENCOUNTER — Encounter (HOSPITAL_COMMUNITY): Payer: BC Managed Care – PPO

## 2014-10-19 ENCOUNTER — Encounter (HOSPITAL_COMMUNITY): Payer: BC Managed Care – PPO

## 2014-10-21 ENCOUNTER — Encounter (HOSPITAL_COMMUNITY): Payer: BC Managed Care – PPO

## 2014-10-24 ENCOUNTER — Telehealth: Payer: Self-pay | Admitting: Cardiovascular Disease

## 2014-10-24 ENCOUNTER — Encounter (HOSPITAL_COMMUNITY): Payer: BC Managed Care – PPO

## 2014-10-24 NOTE — Telephone Encounter (Signed)
New Message  Pt requested to speak w/ Rn. Pt wanted to know if a March or April F/U appt w/ cooper is necessary if she is coming in 2/4 w/ Tereso NewcomerScott Weaver. Please call back and discuss.

## 2014-10-24 NOTE — Telephone Encounter (Signed)
Calling wanting to know if an appointment w/Dr. Excell Seltzerooper in March or April will be necessary if she is seeing Tereso NewcomerScott Weaver on 2/4.  Advised that Lorin PicketScott will let her know but probably will want her to follow up with Dr. Excell Seltzerooper. She verbalizes understanding.

## 2014-10-26 ENCOUNTER — Encounter (HOSPITAL_COMMUNITY): Payer: BC Managed Care – PPO

## 2014-10-26 ENCOUNTER — Telehealth: Payer: Self-pay | Admitting: Family Medicine

## 2014-10-26 NOTE — Telephone Encounter (Signed)
Pt called stating she is going to dentist and need antibiotic called in.  She will be going for the next 3 week once weekly. [facility] [address]  [phone]

## 2014-10-27 ENCOUNTER — Other Ambulatory Visit: Payer: Self-pay | Admitting: *Deleted

## 2014-10-27 MED ORDER — ALPRAZOLAM 1 MG PO TABS: 1.0000 mg | ORAL_TABLET | Freq: Three times a day (TID) | ORAL | Status: DC | PRN

## 2014-10-27 MED ORDER — AMOXICILLIN 500 MG PO CAPS: 2000.0000 mg | ORAL_CAPSULE | Freq: Once | ORAL | Status: DC

## 2014-10-27 NOTE — Telephone Encounter (Signed)
Rx called in as directed.   

## 2014-10-27 NOTE — Telephone Encounter (Signed)
Sent in. Take 2000mg  prior to each procedure.

## 2014-10-27 NOTE — Telephone Encounter (Signed)
Ok to refill 

## 2014-10-27 NOTE — Telephone Encounter (Signed)
plz phone in. 

## 2014-10-27 NOTE — Telephone Encounter (Signed)
Patient notified and verbalized understanding. 

## 2014-10-28 ENCOUNTER — Encounter (HOSPITAL_COMMUNITY): Payer: BC Managed Care – PPO

## 2014-10-31 ENCOUNTER — Encounter (HOSPITAL_COMMUNITY): Payer: BLUE CROSS/BLUE SHIELD

## 2014-11-02 ENCOUNTER — Encounter (HOSPITAL_COMMUNITY): Payer: BLUE CROSS/BLUE SHIELD

## 2014-11-03 ENCOUNTER — Telehealth: Payer: Self-pay | Admitting: Internal Medicine

## 2014-11-03 ENCOUNTER — Ambulatory Visit (INDEPENDENT_AMBULATORY_CARE_PROVIDER_SITE_OTHER): Payer: BLUE CROSS/BLUE SHIELD | Admitting: Physician Assistant

## 2014-11-03 ENCOUNTER — Encounter: Payer: Self-pay | Admitting: Physician Assistant

## 2014-11-03 VITALS — BP 90/50 | HR 75 | Ht 60.0 in | Wt 228.0 lb

## 2014-11-03 DIAGNOSIS — Z9889 Other specified postprocedural states: Secondary | ICD-10-CM

## 2014-11-03 DIAGNOSIS — I4891 Unspecified atrial fibrillation: Secondary | ICD-10-CM

## 2014-11-03 DIAGNOSIS — R079 Chest pain, unspecified: Secondary | ICD-10-CM

## 2014-11-03 DIAGNOSIS — I5042 Chronic combined systolic (congestive) and diastolic (congestive) heart failure: Secondary | ICD-10-CM

## 2014-11-03 DIAGNOSIS — I422 Other hypertrophic cardiomyopathy: Secondary | ICD-10-CM

## 2014-11-03 DIAGNOSIS — I471 Supraventricular tachycardia: Secondary | ICD-10-CM

## 2014-11-03 DIAGNOSIS — Z95810 Presence of automatic (implantable) cardiac defibrillator: Secondary | ICD-10-CM

## 2014-11-03 DIAGNOSIS — I472 Ventricular tachycardia, unspecified: Secondary | ICD-10-CM

## 2014-11-03 MED ORDER — RANOLAZINE ER 1000 MG PO TB12
1000.0000 mg | ORAL_TABLET | Freq: Two times a day (BID) | ORAL | Status: DC
Start: 2014-11-03 — End: 2015-04-17

## 2014-11-03 MED ORDER — MEXILETINE HCL 150 MG PO CAPS: 300.0000 mg | ORAL_CAPSULE | Freq: Three times a day (TID) | ORAL | Status: DC

## 2014-11-03 MED ORDER — APIXABAN 5 MG PO TABS: 5.0000 mg | ORAL_TABLET | Freq: Two times a day (BID) | ORAL | Status: DC

## 2014-11-03 MED ORDER — POTASSIUM GLUCONATE 595 MG PO CAPS: 595.0000 mg | ORAL_CAPSULE | Freq: Every day | ORAL | Status: AC

## 2014-11-03 MED ORDER — FUROSEMIDE 40 MG PO TABS: 40.0000 mg | ORAL_TABLET | Freq: Every day | ORAL | Status: DC

## 2014-11-03 MED ORDER — METOPROLOL SUCCINATE ER 50 MG PO TB24: 50.0000 mg | ORAL_TABLET | Freq: Every day | ORAL | Status: DC

## 2014-11-03 NOTE — Progress Notes (Signed)
Cardiology Office Note   Date:  11/03/2014   ID:  CORDELIA BESSINGER, DOB 31-Jul-1971, MRN [id number]  PCP:  Eustaquio Boyden, MD  Cardiologist:  Dr. Tonny Bollman   Electrophysiologist:  Dr. Lewayne Bunting   Chief Complaint  Patient presents with  . Chest Pain  . Cardiomyopathy     History of Present Illness: [name] is a 44 y.o. female with a hx of HOCM status post septal myomectomy and mitral valve repair at [facility] in 12/2011, chronic combined systolic and diastolic HF.  Postoperative course was complicated by ventricular tachycardia, atrial fibrillation and left upper extremity DVT. AICD was implanted. She had necrotic skin reaction on Coumadin and was transitioned to Pradaxa >> then Eliquis. 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 She ultimately underwent pericardial window by Dr. Cornelius Moras. She has had refractory VT/VF and atrial arrhythmias.  She has failed medical Rx with multiple antiarrhythmics.  She has had multiple episodes of appropriate and inappropriate ICD shocks.    Admitted 10/2013 with ICD shock in the setting of AFlutter >> s/p RFCA. Admitted 12/2013 with atrial tachycardia. Admitted in 08/2014 for ICD shock in the setting of ATach.  Beta blocker was adjusted.    Evaluated in the emergency room 10/11/14 for pain around her ICD site.  ED notes indicate that her device was interrogated without significant findings. Cardiac markers were normal.    The patient is doing well since discharge. She continues to have occasional episodes of chest discomfort. This seems to be just to the center of her device. A little bit tender sometimes when she touches it. She denies exertional symptoms. It tends to come and go. She denies associated symptoms. She denies orthopnea. She denies significant edema. Her weight has increased. However, she is not doing any activity. She is getting ready to resume cardiac rehabilitation soon. Device was  interrogated today. It seems to be functioning appropriately at her current settings.   Studies/Reports Reviewed Today:   - Echocardiogram (11/2013):  HOCM with prior septal myomectomy, moderate LVH, EF 40-45%, trivial AI, mitral valve s/p repair with a small diastolic gradient, no MS (mean 9 mmHg, peak 16 mmHg), trivial MR, moderate LAE   - Dg Chest 2 View  10/11/2014   CLINICAL DATA:  Shortness of breath, acute chest pain.  EXAM: CHEST  2 VIEW  COMPARISON:  August 31, 2014.  FINDINGS: The heart size and mediastinal contours are within normal limits. No pneumothorax or pleural effusion is noted. Left-sided pacemaker is unchanged in position. Sternotomy wires are noted. Both lungs are clear. The visualized skeletal structures are unremarkable.  IMPRESSION: No acute cardiopulmonary abnormality seen.   Electronically Signed   By: Roque Lias M.D.   On: 10/11/2014 15:09      Past Medical History  Diagnosis Date  . Hypertrophic cardiomyopathy     s/p septal myomectomy with implantable defibrillator 01/27/2012   . Anxiety state, unspecified   . Allergic rhinitis     to dogs  . Asthma   . Anemia     during pregnancy  . History of chicken pox   . Generalized headaches   . Ocular migraine     no HA  . Back pain     told cracked bone, vicodin didn't help, improved with percocets/muscle relaxants, no eval by prior PCP  . 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 DVT (deep venous  thrombosis) 01/2012    was placed on Warfarin after cardiac surgery but had skin reaction to warfarin so was changed to Pradaxa so DVT occurred while on Pradaxa although unclear if it occurred during transition  . Hypotension   . Atrial fibrillation     a. On pradaxa.  coumadin necrosis with warfarin;  b. Amio d/c'd 02/2012  . Pericardial effusion   . Chronic combined systolic and diastolic CHF (congestive heart failure)     a. echo 03/19/12: severe asymmetric septal  hypertrophy, evidence of upper septal myomectomy, peak LV outflow tract gradient 35, EF 35%,;  b.  Echo 9/13: Severe LVH, EF 30-35%, anteroseptal and inferior HK, LVOT narrow, mild AI, mild to moderate mitral stenosis, severe LAE  . Pericardial tamponade 03/14/2012  . 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 01/2012 s/p BSX ICD;  b. 06/2012 Multaq initiated due to recurrent syncope, VT/VF - had severe n/v with IV amio.  . Skin necrosis --COUMADIN   . Chronic chest wall pain     eval by CVTS, stable, rec return to [facility] if continued pain  . Syncope     a. in setting of VT/VF.  Marland Kitchen Hypothyroid   . Blood transfusion without reported diagnosis   . Neuromuscular disorder   . Gall stones   . Atrial flutter 10/2013    with inappropriate ICD therapy; s/p CTI ablation by Dr Ladona Ridgel 11/16/2013  . PONV (postoperative nausea and vomiting)   . Depression   . Post-traumatic stress   . Fibromyalgia     Past Surgical History  Procedure Laterality Date  . Cesarean section  1991  . Induced abortion  1990 and 1993  . Cardiac surgery  01/27/2012    median sternotomy, septal myectomy, MVR - [facility] (Dr. Silvestre Mesi)  . Cardiac defibrillator placement  01/2012    BSX dual chamber ICD  . Myomectomy    . Pericardial window  03/14/2012    Procedure: PERICARDIAL WINDOW;  Surgeon: Purcell Nails, MD;  Location: [facility] OR;  Service: Open Heart Surgery;  Laterality: N/A;  Subxyphoid Pericardial  Window   . Ablation  11-16-13    RFCA of atrial flutter by Dr Ladona Ridgel   . Insert / replace / remove pacemaker    . Mitral valve repair    . Cholecystectomy N/A 01/26/2014    LAPAROSCOPIC CHOLECYSTECTOMY WITH INTRAOPERATIVE CHOLANGIOGRAM;  Surgeon: Wilmon Arms. Tsuei, MD  . Atrial flutter ablation N/A 11/16/2013    Procedure: ATRIAL FLUTTER ABLATION;  Surgeon: Marinus Maw, MD;  Location: [facility] CATH LAB;  Service: Cardiovascular;  Laterality: N/A;     Current Outpatient Prescriptions  Medication Sig Dispense Refill  . acetaminophen  (TYLENOL) 500 MG tablet Take 500-1,000 mg by mouth 2 (two) times daily as needed for mild pain, moderate pain, fever or headache (pain).     Marland Kitchen albuterol (PROVENTIL HFA;VENTOLIN HFA) 108 (90 BASE) MCG/ACT inhaler Inhale 1 puff into the lungs every 4 (four) hours as needed for wheezing or shortness of breath.     . ALPRAZolam (XANAX) 1 MG tablet Take 1 tablet (1 mg total) by mouth 3 (three) times daily as needed for anxiety. 70 tablet 0  . amoxicillin (AMOXIL) 500 MG capsule Take 4 capsules (2,000 mg total) by mouth once. For dental procedure 12 capsule 0  . apixaban (ELIQUIS) 5 MG TABS tablet Take 1 tablet (5 mg total) by mouth 2 (two) times daily. (Patient taking differently: Take 5 mg by mouth 2 (two) times daily. 6am and  6pm) 60 tablet 8  . beclomethasone (QVAR) 80 MCG/ACT inhaler Inhale 1 puff into the lungs 2 (two) times daily. 9am and 8pm    . cetirizine (ZYRTEC) 10 MG tablet Take 10 mg by mouth daily.     Marland Kitchen. docusate sodium (COLACE) 100 MG capsule Take 100 mg by mouth 2 (two) times daily as needed for mild constipation.    . furosemide (LASIX) 40 MG tablet Take 1 tablet (40 mg total) by mouth daily. 30 tablet 3  . gabapentin (NEURONTIN) 300 MG capsule TAKE TWO CAPSULES BY MOUTH THREE TIMES DAILY 180 capsule 0  . levonorgestrel (MIRENA) 20 MCG/24HR IUD 1 each by Intrauterine route once.    Marland Kitchen. levothyroxine (SYNTHROID, LEVOTHROID) 50 MCG tablet TAKE ONE TABLET BY MOUTH ONCE DAILY BEFORE BREAKFAST 30 tablet 3  . magnesium gluconate (MAGONATE) 500 MG tablet Take 250 mg by mouth 2 (two) times daily.    . metoprolol succinate (TOPROL-XL) 50 MG 24 hr tablet Take 1 tablet (50 mg total) by mouth daily. Take with or immediately following a meal. 60 tablet 5  . mexiletine (MEXITIL) 150 MG capsule TAKE TWO CAPSULES BY MOUTH THREE TIMES DAILY 180 capsule 3  . Olopatadine HCl (PATANASE) 0.6 % SOLN Place 1 puff into the nose 2 (two) times daily.    Marland Kitchen. oxyCODONE-acetaminophen (PERCOCET/ROXICET) 5-325 MG per  tablet Take 1-2 tablets by mouth every 4 (four) hours as needed for moderate pain. 40 tablet 0  . Potassium Gluconate 595 MG CAPS Take 595 mg by mouth daily. OK to take extra on days you need extra lasix    . ranolazine (RANEXA) 1000 MG SR tablet Take 1 tablet (1,000 mg total) by mouth 2 (two) times daily. 180 tablet 3  . sertraline (ZOLOFT) 50 MG tablet Take 1 tablet (50 mg total) by mouth daily. 30 tablet 3   No current facility-administered medications for this visit.    Allergies:   Amiodarone; Phenergan; Warfarin and related; and Nitroglycerin    Social History:  The patient  reports that she quit smoking about 26 years ago. Her smoking use included Cigarettes. She has a 3 pack-year smoking history. She has never used smokeless tobacco. She reports that she does not drink alcohol or use illicit drugs.   Family History:  The patient's family history includes Anemia in her mother; Cancer in her paternal grandfather; Diabetes in her father and paternal grandfather; Heart disease in her father and maternal grandfather; Hyperlipidemia in her maternal grandfather; Thyroid disease in her mother. There is no history of Coronary artery disease, Stroke, or Sudden death.    ROS:  Please see the history of present illness.   Otherwise, review of systems are positive for palpitations, occasional diarrhea, anxiety, balance issues (no falls), back pain, chronic headaches.   All other systems are reviewed and negative.    PHYSICAL EXAM: VS:  BP 90/50 mmHg  Pulse 75  Ht 5' (1.524 m)  Wt 228 lb (103.42 kg)  BMI 44.53 kg/m2    Wt Readings from Last 3 Encounters:  10/11/14 220 lb (99.791 kg)  09/27/14 224 lb 12.8 oz (101.969 kg)  09/02/14 221 lb 9.6 oz (100.517 kg)     GEN: Well nourished, well developed, in no acute distress HEENT: normal Neck: no JVD, no masses Cardiac:  Normal S1/S2, RRR; 2/6 systolic murmur LSB, no rubs or gallops, no edema  Respiratory:  clear to auscultation bilaterally,  no wheezing, rhonchi or rales. GI: soft, nontender, nondistended, + BS MS: no  deformity or atrophy Skin: warm and dry  Neuro:  CNs II-XII intact, Strength and sensation are intact Psych: Normal affect   EKG:  EKG is ordered today.  It demonstrates:   Atrial paced, HR 75, LBBB   Recent Labs: 03/14/2014: ALT 26 08/31/2014: Magnesium 2.1; Pro B Natriuretic peptide (BNP) 723.7*; TSH 3.211 10/11/2014: B Natriuretic Peptide 130.6*; BUN 17; Creatinine 0.90; Hemoglobin 14.0; Platelets 283; Potassium 4.9; Sodium 137    Lipid Panel    Component Value Date/Time   CHOL 192 08/16/2013 0823   TRIG 152.0* 08/16/2013 0823   HDL 49.10 08/16/2013 0823   CHOLHDL 4 08/16/2013 0823   VLDL 30.4 08/16/2013 0823   LDLCALC 113* 08/16/2013 0823      ASSESSMENT AND PLAN:  1.  Chest Pain:  This is atypical. She had a recent chest x-ray that was normal. Her device is functioning appropriately. She does not have any signs of infection. No further workup is warranted. 2.  Atrial Arrhythmias:  Maintaining sinus rhythm. She remains on Eliquis. 3.  Ventricular tachycardia/Ventricular Fibrillation:  No further episodes. She remains on mexiletine and Ranexa. 4.  S/p AICD:  Follow-up with EP as planned. 5.  Hypertrophic Obstructive Cardiomyopathy, s/p Septal Myomectomy:  Recent echocardiogram stable. 6.  S/p Mitral Valve Repair:  Recent echocardiogram stable. 7.  Chronic Combined Systolic and Diastolic CHF:  Volume appears stable. Continue current therapy.   Current medicines are reviewed at length with the patient today.  The patient does not have concerns regarding medicines.  The following changes have been made:  As above.   Labs/ tests ordered today include:   Orders Placed This Encounter  Procedures  . EKG 12-Lead    Disposition:   FU with Dr. Tonny Bollman  in 2 months   Signed, Brynda Rim, MHS 11/03/2014 8:12 AM    [facility] [address] [place], [place], [place]   [address] Phone: [phone]; Fax: [phone]

## 2014-11-03 NOTE — Telephone Encounter (Signed)
New message      Need new order sent to cardiac rehab. Old order has expired

## 2014-11-03 NOTE — Patient Instructions (Addendum)
FOLLOW UP WITH DR. Excell SeltzerOOPER 12/2014  YOU HAVE BEEN GIVEN PAPER REFILLS FOR ELIQUIS, METOPROLOL, K GLUCONATE, RANEXA, MEXILITINE, LASIX  Your physician recommends that you continue on your current medications as directed. Please refer to the Current Medication list given to you today.

## 2014-11-04 ENCOUNTER — Encounter (HOSPITAL_COMMUNITY): Payer: BLUE CROSS/BLUE SHIELD

## 2014-11-04 NOTE — Telephone Encounter (Signed)
Order for Dr Ladona Ridgelaylor to sign in his folder for next week  Patient aware

## 2014-11-07 ENCOUNTER — Encounter (HOSPITAL_COMMUNITY): Payer: BLUE CROSS/BLUE SHIELD

## 2014-11-09 ENCOUNTER — Encounter (HOSPITAL_COMMUNITY): Payer: BLUE CROSS/BLUE SHIELD

## 2014-11-11 ENCOUNTER — Other Ambulatory Visit: Payer: Self-pay | Admitting: Neurology

## 2014-11-11 ENCOUNTER — Encounter (HOSPITAL_COMMUNITY): Payer: BLUE CROSS/BLUE SHIELD

## 2014-11-14 ENCOUNTER — Telehealth (HOSPITAL_COMMUNITY): Payer: Self-pay | Admitting: *Deleted

## 2014-11-14 NOTE — Telephone Encounter (Signed)
-----   Message from Micheline ChapmanMichael D Cooper, MD sent at 11/14/2014 10:59 AM EST ----- Regarding: RE: Rip HarbourOk to restart cardiac rehab Yes she's fine to start. thx ----- Message -----    From: Chelsea Ausarlette B Taegen Lennox, RN    Sent: 11/14/2014  10:33 AM      To: Micheline ChapmanMichael D Cooper, MD Subject: Rip HarbourOk to restart cardiac rehab                     Pt now has established transportation (not cleared to drive from [employer] fired [id number]).  Ladona Ridgelaylor did resign the order. (previous order too old to use). From the primary cardiologist standpoint, ok to proceed with cardiac rehab?  Last follow up was 2/4 with Tereso NewcomerScott Weaver.  Thanks so much for your input PepsiCoCarlette Jocie Meroney RN

## 2014-11-16 ENCOUNTER — Encounter (HOSPITAL_COMMUNITY): Payer: Self-pay | Admitting: *Deleted

## 2014-11-16 ENCOUNTER — Emergency Department (HOSPITAL_COMMUNITY)
Admission: EM | Admit: 2014-11-16 | Discharge: 2014-11-16 | Disposition: A | Discharge status: Home / Self Care | Payer: BLUE CROSS/BLUE SHIELD | Attending: Emergency Medicine | Admitting: Emergency Medicine

## 2014-11-16 ENCOUNTER — Emergency Department (HOSPITAL_COMMUNITY): Payer: BLUE CROSS/BLUE SHIELD

## 2014-11-16 DIAGNOSIS — Z872 Personal history of diseases of the skin and subcutaneous tissue: Secondary | ICD-10-CM | POA: Diagnosis not present

## 2014-11-16 DIAGNOSIS — Z95810 Presence of automatic (implantable) cardiac defibrillator: Secondary | ICD-10-CM | POA: Diagnosis not present

## 2014-11-16 DIAGNOSIS — F329 Major depressive disorder, single episode, unspecified: Secondary | ICD-10-CM | POA: Diagnosis not present

## 2014-11-16 DIAGNOSIS — Z7951 Long term (current) use of inhaled steroids: Secondary | ICD-10-CM | POA: Diagnosis not present

## 2014-11-16 DIAGNOSIS — J45901 Unspecified asthma with (acute) exacerbation: Secondary | ICD-10-CM | POA: Insufficient documentation

## 2014-11-16 DIAGNOSIS — F419 Anxiety disorder, unspecified: Secondary | ICD-10-CM | POA: Diagnosis not present

## 2014-11-16 DIAGNOSIS — E039 Hypothyroidism, unspecified: Secondary | ICD-10-CM | POA: Diagnosis not present

## 2014-11-16 DIAGNOSIS — Z792 Long term (current) use of antibiotics: Secondary | ICD-10-CM | POA: Insufficient documentation

## 2014-11-16 DIAGNOSIS — Z87891 Personal history of nicotine dependence: Secondary | ICD-10-CM | POA: Insufficient documentation

## 2014-11-16 DIAGNOSIS — G8929 Other chronic pain: Secondary | ICD-10-CM | POA: Diagnosis not present

## 2014-11-16 DIAGNOSIS — I5042 Chronic combined systolic (congestive) and diastolic (congestive) heart failure: Secondary | ICD-10-CM | POA: Insufficient documentation

## 2014-11-16 DIAGNOSIS — Z8619 Personal history of other infectious and parasitic diseases: Secondary | ICD-10-CM | POA: Diagnosis not present

## 2014-11-16 DIAGNOSIS — Z8719 Personal history of other diseases of the digestive system: Secondary | ICD-10-CM | POA: Insufficient documentation

## 2014-11-16 DIAGNOSIS — Z86718 Personal history of other venous thrombosis and embolism: Secondary | ICD-10-CM | POA: Insufficient documentation

## 2014-11-16 DIAGNOSIS — M546 Pain in thoracic spine: Secondary | ICD-10-CM | POA: Insufficient documentation

## 2014-11-16 DIAGNOSIS — G43809 Other migraine, not intractable, without status migrainosus: Secondary | ICD-10-CM | POA: Diagnosis not present

## 2014-11-16 DIAGNOSIS — Z793 Long term (current) use of hormonal contraceptives: Secondary | ICD-10-CM | POA: Insufficient documentation

## 2014-11-16 LAB — BASIC METABOLIC PANEL
Anion gap: 8 (ref 5–15)
BUN: 18 mg/dL (ref 6–23)
CALCIUM: 9.4 mg/dL (ref 8.4–10.5)
CO2: 28 mmol/L (ref 19–32)
CREATININE: 0.91 mg/dL (ref 0.50–1.10)
Chloride: 103 mmol/L (ref 96–112)
GFR calc Af Amer: 88 mL/min — ABNORMAL LOW (ref >90)
GFR, EST NON AFRICAN AMERICAN: 76 mL/min — AB (ref >90)
Glucose, Bld: 114 mg/dL — ABNORMAL HIGH (ref 70–99)
Potassium: 4.2 mmol/L (ref 3.5–5.1)
Sodium: 139 mmol/L (ref 135–145)

## 2014-11-16 LAB — CBC
HCT: 42.4 % (ref 36.0–46.0)
Hemoglobin: 13.9 g/dL (ref 12.0–15.0)
MCH: 30.5 pg (ref 26.0–34.0)
MCHC: 32.8 g/dL (ref 30.0–36.0)
MCV: 93.2 fL (ref 78.0–100.0)
Platelets: 251 10*3/uL (ref 150–400)
RBC: 4.55 MIL/uL (ref 3.87–5.11)
RDW: 13.3 % (ref 11.5–15.5)
WBC: 5.3 10*3/uL (ref 4.0–10.5)

## 2014-11-16 LAB — I-STAT TROPONIN, ED: TROPONIN I, POC: 0.03 ng/mL (ref 0.00–0.08)

## 2014-11-16 LAB — BRAIN NATRIURETIC PEPTIDE: B NATRIURETIC PEPTIDE 5: 186.1 pg/mL — AB (ref 0.0–100.0)

## 2014-11-16 NOTE — ED Provider Notes (Signed)
CSN: [id number]     Arrival date & time 11/16/14  1053 History   First MD Initiated Contact with Patient 11/16/14 1232     Chief Complaint  Patient presents with  . Back Pain  . Shortness of Breath    [name] is a 44 y.o. female with a history of hypertrophic obstructive cardiomyopathy status post myomectomy in 2013, history of a pericardial effusion with a window, Biventricular pacemaker, DVT, a-fib, and mitral valve repair who presents to the ED complaining of left sided mid back pain for the past week associated with dyspnea on exertion for the past week. She is concerned about his back pain as she thinks this is similar to when she had the effusion. She is followed by Dr. Excell Seltzer and Dr. Ladona Ridgel from cardiology. She last followed up with cardiology about 1 week ago. She rates her pain at 10/10 and constant. Her pain does not change with movement or exertion. Her pain is not worse with touching. She reports gaining 10 pounds in one month. She reports having dependent foot edema, but none currently. She reports her pain is worse today because she has not taken her pain medications at home. She sees pain management for her pain.  She is on Eliquis. She denies fevers, chills, recent illness, cough, wheezing, numbness, tingling, weakness, chest pain, palpitations, dysuria, hematuria, abdominal pain, nausea, vomiting, diarrhea, leg swelling or pain, or rashes.  (Consider location/radiation/quality/duration/timing/severity/associated sxs/prior Treatment) HPI  Past Medical History  Diagnosis Date  . Hypertrophic cardiomyopathy     s/p septal myomectomy with implantable defibrillator 01/27/2012   . Anxiety state, unspecified   . Allergic rhinitis     to dogs  . Asthma   . Anemia     during pregnancy  . History of chicken pox   . Generalized headaches   . Ocular migraine     no HA  . Back pain     told cracked bone, vicodin didn't help, improved with percocets/muscle relaxants, no eval by  prior PCP  . 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 DVT (deep venous thrombosis) 01/2012    was placed on Warfarin after cardiac surgery but had skin reaction to warfarin so was changed to Pradaxa so DVT occurred while on Pradaxa although unclear if it occurred during transition  . Hypotension   . Atrial fibrillation     a. On pradaxa.  coumadin necrosis with warfarin;  b. Amio d/c'd 02/2012  . Pericardial effusion   . Chronic combined systolic and diastolic CHF (congestive heart failure)     a. echo 03/19/12: severe asymmetric septal hypertrophy, evidence of upper septal myomectomy, peak LV outflow tract gradient 35, EF 35%,;  b.  Echo 9/13: Severe LVH, EF 30-35%, anteroseptal and inferior HK, LVOT narrow, mild AI, mild to moderate mitral stenosis, severe LAE  . Pericardial tamponade 03/14/2012  . 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 01/2012 s/p BSX ICD;  b. 06/2012 Multaq initiated due to recurrent syncope, VT/VF - had severe n/v with IV amio.  . Skin necrosis --COUMADIN   . Chronic chest wall pain     eval by CVTS, stable, rec return to [facility] if continued pain  . Syncope     a. in setting of VT/VF.  Marland Kitchen Hypothyroid   . Blood transfusion without reported diagnosis   . Neuromuscular disorder   . Gall stones   . Atrial flutter 10/2013  with inappropriate ICD therapy; s/p CTI ablation by Dr Ladona Ridgelaylor 11/16/2013  . PONV (postoperative nausea and vomiting)   . Depression   . Post-traumatic stress   . Fibromyalgia    Past Surgical History  Procedure Laterality Date  . Cesarean section  1991  . Induced abortion  1990 and 1993  . Cardiac surgery  01/27/2012    median sternotomy, septal myectomy, MVR - [facility] (Dr. Silvestre MesiGlower)  . Cardiac defibrillator placement  01/2012    BSX dual chamber ICD  . Myomectomy    . Pericardial window  03/14/2012    Procedure: PERICARDIAL WINDOW;  Surgeon: Purcell Nailslarence H Owen, MD;  [facility];  Service: Open Heart  Surgery;  Laterality: N/A;  Subxyphoid Pericardial  Window   . Ablation  11-16-13    RFCA of atrial flutter by Dr Ladona Ridgelaylor   . Insert / replace / remove pacemaker    . Mitral valve repair    . Cholecystectomy N/A 01/26/2014    LAPAROSCOPIC CHOLECYSTECTOMY WITH INTRAOPERATIVE CHOLANGIOGRAM;  Surgeon: Wilmon ArmsMatthew K. Tsuei, MD  . Atrial flutter ablation N/A 11/16/2013    Procedure: ATRIAL FLUTTER ABLATION;  Surgeon: Marinus MawGregg W Taylor, MD;  [facility];  Service: Cardiovascular;  Laterality: N/A;   Family History  Problem Relation Age of Onset  . Diabetes Father   . Heart disease Father     unspecified heart problem  . Thyroid disease Mother   . Anemia Mother     mediterranean anemia, ITP  . Heart disease Maternal Grandfather     CHF  . Hyperlipidemia Maternal Grandfather   . Diabetes Paternal Grandfather   . Cancer Paternal Grandfather   . Coronary artery disease Neg Hx   . Stroke Neg Hx   . Sudden death Neg Hx    History  Substance Use Topics  . Smoking status: Former Smoker -- 1.00 packs/day for 3 years    Types: Cigarettes    Quit date: 09/30/1988  . Smokeless tobacco: Never Used     Comment: quitin 1990  . Alcohol Use: No     Comment: none since "months" as of 02/28/2013   OB History    No data available     Review of Systems  Constitutional: Negative for fever and chills.  HENT: Negative for congestion and sore throat.   Eyes: Negative for visual disturbance.  Respiratory: Positive for shortness of breath. Negative for cough and wheezing.   Cardiovascular: Negative for chest pain, palpitations and leg swelling.  Gastrointestinal: Negative for nausea, vomiting, abdominal pain, diarrhea and blood in stool.  Genitourinary: Negative for dysuria, urgency, frequency, hematuria and difficulty urinating.  Musculoskeletal: Positive for back pain. Negative for neck pain.  Skin: Negative for rash.  Neurological: Negative for dizziness, tremors, syncope, weakness,  light-headedness, numbness and headaches.      Allergies  Amiodarone; Phenergan; Warfarin and related; and Nitroglycerin  Home Medications   Prior to Admission medications   Medication Sig Start Date End Date Taking? Authorizing Provider  acetaminophen (TYLENOL) 500 MG tablet Take 500-1,000 mg by mouth 2 (two) times daily as needed for mild pain, moderate pain, fever or headache (pain).     Historical Provider, MD  albuterol (PROVENTIL HFA;VENTOLIN HFA) 108 (90 BASE) MCG/ACT inhaler Inhale 1 puff into the lungs every 4 (four) hours as needed for wheezing or shortness of breath.     Historical Provider, MD  ALPRAZolam Prudy Feeler(XANAX) 1 MG tablet Take 1 tablet (1 mg total) by mouth 3 (three) times daily as  needed for anxiety. 10/27/14   Eustaquio Boyden, MD  amoxicillin (AMOXIL) 500 MG capsule Take 4 capsules (2,000 mg total) by mouth once. For dental procedure 10/27/14   Eustaquio Boyden, MD  apixaban (ELIQUIS) 5 MG TABS tablet Take 1 tablet (5 mg total) by mouth 2 (two) times daily. 11/03/14   Beatrice Lecher, PA-C  beclomethasone (QVAR) 80 MCG/ACT inhaler Inhale 1 puff into the lungs 2 (two) times daily. 9am and 8pm    Historical Provider, MD  cetirizine (ZYRTEC) 10 MG tablet Take 10 mg by mouth daily.     Historical Provider, MD  docusate sodium (COLACE) 100 MG capsule Take 100 mg by mouth 2 (two) times daily as needed for mild constipation.    Historical Provider, MD  furosemide (LASIX) 40 MG tablet Take 1 tablet (40 mg total) by mouth daily. 11/03/14   Beatrice Lecher, PA-C  gabapentin (NEURONTIN) 300 MG capsule TAKE TWO CAPSULES BY MOUTH THREE TIMES DAILY 11/11/14   Cira Servant, DO  levonorgestrel (MIRENA) 20 MCG/24HR IUD 1 each by Intrauterine route once.    Historical Provider, MD  levothyroxine (SYNTHROID, LEVOTHROID) 50 MCG tablet TAKE ONE TABLET BY MOUTH ONCE DAILY BEFORE BREAKFAST 08/08/14   Eustaquio Boyden, MD  magnesium gluconate (MAGONATE) 500 MG tablet Take 250 mg by mouth 2 (two) times  daily.    Historical Provider, MD  metoprolol succinate (TOPROL-XL) 50 MG 24 hr tablet Take 1 tablet (50 mg total) by mouth daily. Take with or immediately following a meal. 11/03/14   Beatrice Lecher, PA-C  mexiletine (MEXITIL) 150 MG capsule Take 2 capsules (300 mg total) by mouth 3 (three) times daily. 11/03/14   Beatrice Lecher, PA-C  Olopatadine HCl (PATANASE) 0.6 % SOLN Place 1 puff into the nose 2 (two) times daily.    Historical Provider, MD  oxyCODONE-acetaminophen (PERCOCET) 7.5-325 MG per tablet Take 1 tablet by mouth every 4 (four) hours as needed.  10/13/14   Historical Provider, MD  Potassium Gluconate 595 MG CAPS Take 1 capsule (595 mg total) by mouth daily. OK to take extra on days you need extra lasix 11/03/14   Beatrice Lecher, PA-C  ranolazine (RANEXA) 1000 MG SR tablet Take 1 tablet (1,000 mg total) by mouth 2 (two) times daily. 11/03/14 11/03/15  Beatrice Lecher, PA-C  sertraline (ZOLOFT) 50 MG tablet Take 1 tablet (50 mg total) by mouth daily. 08/09/14   Eustaquio Boyden, MD   BP 95/72 mmHg  Pulse 76  Temp(Src) 98.8 F (37.1 C) (Oral)  Resp 16  Ht 5' (1.524 m)  Wt 230 lb (104.327 kg)  BMI 44.92 kg/m2  SpO2 99% Physical Exam  Constitutional: She is oriented to person, place, and time. She appears well-developed and well-nourished. No distress.  HENT:  Head: Normocephalic and atraumatic.  Mouth/Throat: Oropharynx is clear and moist. No oropharyngeal exudate.  Eyes: Conjunctivae are normal. Pupils are equal, round, and reactive to light. Right eye exhibits no discharge. Left eye exhibits no discharge.  Neck: Normal range of motion. Neck supple. No JVD present.  Cardiovascular: Normal rate, regular rhythm, normal heart sounds and intact distal pulses.   Bilateral radial, posterior tibialis and dorsalis pedis pulses are intact.    Pulmonary/Chest: Effort normal and breath sounds normal. No respiratory distress. She has no wheezes. She has no rales. She exhibits no tenderness.   Abdominal: Soft. She exhibits no distension. There is no tenderness.  Musculoskeletal: She exhibits no edema or tenderness.  Left mid back  is mildly tender to palpation, but does not completely reproduce her pain. No midline tenderness. No crepitus, step offs, deformity or edema. No rashes.  No lower extremity edema or tenderness. Patient is spontaneously moving all extremities in a coordinated fashion exhibiting good strength.   Lymphadenopathy:    She has no cervical adenopathy.  Neurological: She is alert and oriented to person, place, and time. Coordination normal.  Skin: Skin is warm and dry. No rash noted. She is not diaphoretic. No erythema. No pallor.  Psychiatric: She has a normal mood and affect. Her behavior is normal.  Nursing note and vitals reviewed.   ED Course  Procedures (including critical care time) Labs Review Labs Reviewed  BASIC METABOLIC PANEL - Abnormal; Notable for the following:    Glucose, Bld 114 (*)    GFR calc non Af Amer 76 (*)    GFR calc Af Amer 88 (*)    All other components within normal limits  BRAIN NATRIURETIC PEPTIDE - Abnormal; Notable for the following:    B Natriuretic Peptide 186.1 (*)    All other components within normal limits  CBC  I-STAT TROPOININ, ED    Imaging Review Dg Chest 2 View  11/16/2014   CLINICAL DATA:  Pain and difficulty breathing  EXAM: CHEST  2 VIEW  COMPARISON:  October 11, 2014  FINDINGS: There is no edema or consolidation. Heart is borderline enlarged with pulmonary vascularity within normal limits. Pacemaker leads are unchanged. In addition to these which appear attached to the right atrium and right ventricle, there is a which may be extending into the azygos region. No adenopathy. No bone lesions.  IMPRESSION: Stable positioning of pacemaker leads. Heart borderline increased in size. Lungs clear.   Electronically Signed   By: Bretta Bang III M.D.   On: 11/16/2014 12:01     EKG  Interpretation   Date/Time:  Wednesday November 16 2014 11:18:31 EST Ventricular Rate:  75 PR Interval:  180 QRS Duration: 162 QT Interval:  444 QTC Calculation: 495 R Axis:   85 Text Interpretation:  Electronic atrial pacemaker Left bundle branch block  Abnormal ECG Confirmed by Fayrene Fearing  MD, MARK ([id number]) on 11/16/2014 1:09:21 PM     Filed Vitals:   11/16/14 1111 11/16/14 1327 11/16/14 1430  BP: 102/53 94/56 95/72   Pulse: 75 75 76  Temp: 97.8 F (36.6 C) 98.8 F (37.1 C)   TempSrc: Oral Oral   Resp: 20 16 16   Height: 5' (1.524 m)    Weight: 230 lb (104.327 kg)    SpO2: 98% 96% 99%    MDM   Meds given in ED:  Medications - No data to display  Discharge Medication List as of 11/16/2014  2:14 PM      Final diagnoses:  Left-sided thoracic back pain   This is a 44 y.o. female with a history of hypertrophic obstructive cardiomyopathy status post myomectomy in 2013, history of a pericardial effusion with a window, Biventricular pacemaker, DVT, a-fib, and mitral valve repair who presents to the ED complaining of left sided mid back pain for the past week associated with dyspnea on exertion for the past week. She is concerned about his back pain as she thinks this is similar to when she had the effusion. Patient is afebrile and nontoxic-appearing. The patient has no lower extremity edema. Patients left mid back is mildly tender to palpation. There is no bony point tenderness she denies injury. She denies urinary complaints. She denies numbness or tingling.  Patient is neurologically intact. Her BP is slightly low, however this is normal for her. Her last BP with cardiology is 90/50. Patient's troponin is negative. Her CBC and BMP are unremarkable. EKG shows a paced rhythm which is unchanged from her previous. Chest x-ray shows stable positioning of pacemaker leads as well as heart borderline increase in size and lungs clear. I called and spoke with Dr. Shirlee Latch from Cardiology who would like  the patient to follow-up in office later this week. The patient was given instructions to follow-up this week with cardiology. Strict return precautions were provided. Patient is in pain management and has pain medications at home. I advised the patient to follow-up with their primary care provider this week. I advised the patient to return to the emergency department with new or worsening symptoms or new concerns. The patient verbalized understanding and agreement with plan.   This patient was discussed with Dr. Fayrene Fearing who agrees with assessment and plan.     Lawana Chambers, PA-C 11/16/14 1807  Rolland Porter, MD 11/23/14 [phone]

## 2014-11-16 NOTE — ED Notes (Signed)
Pt sts she has had a 10 lb weight gain within the last month

## 2014-11-16 NOTE — Discharge Instructions (Signed)
Back Exercises °Back exercises help treat and prevent back injuries. The goal of back exercises is to increase the strength of your abdominal and back muscles and the flexibility of your back. These exercises should be started when you no longer have back pain. Back exercises include: °· Pelvic Tilt. Lie on your back with your knees bent. Tilt your pelvis until the lower part of your back is against the floor. Hold this position 5 to 10 sec and repeat 5 to 10 times. °· Knee to Chest. Pull first 1 knee up against your chest and hold for 20 to 30 seconds, repeat this with the other knee, and then both knees. This may be done with the other leg straight or bent, whichever feels better. °· Sit-Ups or Curl-Ups. Bend your knees 90 degrees. Start with tilting your pelvis, and do a partial, slow sit-up, lifting your trunk only 30 to 45 degrees off the floor. Take at least 2 to 3 seconds for each sit-up. Do not do sit-ups with your knees out straight. If partial sit-ups are difficult, simply do the above but with only tightening your abdominal muscles and holding it as directed. °· Hip-Lift. Lie on your back with your knees flexed 90 degrees. Push down with your feet and shoulders as you raise your hips a couple inches off the floor; hold for 10 seconds, repeat 5 to 10 times. °· Back arches. Lie on your stomach, propping yourself up on bent elbows. Slowly press on your hands, causing an arch in your low back. Repeat 3 to 5 times. Any initial stiffness and discomfort should lessen with repetition over time. °· Shoulder-Lifts. Lie face down with arms beside your body. Keep hips and torso pressed to floor as you slowly lift your head and shoulders off the floor. °Do not overdo your exercises, especially in the beginning. Exercises may cause you some mild back discomfort which lasts for a few minutes; however, if the pain is more severe, or lasts for more than 15 minutes, do not continue exercises until you see your caregiver.  Improvement with exercise therapy for back problems is slow.  °See your caregivers for assistance with developing a proper back exercise program. °Document Released: 10/24/2004 Document Revised: 12/09/2011 Document Reviewed: 07/18/2011 °ExitCare® Patient Information ©2015 [employer]. This information is not intended to replace advice given to you by your health care provider. Make sure you discuss any questions you have with your health care provider. ° °Back Pain, Adult °Low back pain is very common. About 1 in 5 people have back pain. The cause of low back pain is rarely dangerous. The pain often gets better over time. About half of people with a sudden onset of back pain feel better in just 2 weeks. About 8 in 10 people feel better by 6 weeks.  °CAUSES °Some common causes of back pain include: °· Strain of the muscles or ligaments supporting the spine. °· Wear and tear (degeneration) of the spinal discs. °· Arthritis. °· Direct injury to the back. °DIAGNOSIS °Most of the time, the direct cause of low back pain is not known. However, back pain can be treated effectively even when the exact cause of the pain is unknown. Answering your caregiver's questions about your overall health and symptoms is one of the most accurate ways to make sure the cause of your pain is not dangerous. If your caregiver needs more information, he or she may order lab work or imaging tests (X-rays or MRIs). However, even if imaging tests show changes in your   back, this usually does not require surgery. HOME CARE INSTRUCTIONS For many people, back pain returns.Since low back pain is rarely dangerous, it is often a condition that people can learn to [facility] Caremanageon their own.   Remain active. It is stressful on the back to sit or stand in one place. Do not sit, drive, or stand in one place for more than 30 minutes at a time. Take short walks on level surfaces as soon as pain allows.Try to increase the length of time you walk each  day.  Do not stay in bed.Resting more than 1 or 2 days can delay your recovery.  Do not avoid exercise or work.Your body is made to move.It is not dangerous to be active, even though your back may hurt.Your back will likely heal faster if you return to being active before your pain is gone.  Pay attention to your body when you bend and lift. Many people have less discomfortwhen lifting if they bend their knees, keep the load close to their bodies,and avoid twisting. Often, the most comfortable positions are those that put less stress on your recovering back.  Find a comfortable position to sleep. Use a firm mattress and lie on your side with your knees slightly bent. If you lie on your back, put a pillow under your knees.  Only take over-the-counter or prescription medicines as directed by your caregiver. Over-the-counter medicines to reduce pain and inflammation are often the most helpful.Your caregiver may prescribe muscle relaxant drugs.These medicines help dull your pain so you can more quickly return to your normal activities and healthy exercise.  Put ice on the injured area.  Put ice in a plastic bag.  Place a towel between your skin and the bag.  Leave the ice on for 15-20 minutes, 03-04 times a day for the first 2 to 3 days. After that, ice and heat may be alternated to reduce pain and spasms.  Ask your caregiver about trying back exercises and gentle massage. This may be of some benefit.  Avoid feeling anxious or stressed.Stress increases muscle tension and can worsen back pain.It is important to recognize when you are anxious or stressed and learn ways to manage it.Exercise is a great option. SEEK MEDICAL CARE IF:  You have pain that is not relieved with rest or medicine.  You have pain that does not improve in 1 week.  You have new symptoms.  You are generally not feeling well. SEEK IMMEDIATE MEDICAL CARE IF:   You have pain that radiates from your back into  your legs.  You develop new bowel or bladder control problems.  You have unusual weakness or numbness in your arms or legs.  You develop nausea or vomiting.  You develop abdominal pain.  You feel faint. Document Released: 09/16/2005 Document Revised: 03/17/2012 Document Reviewed: 01/18/2014 [facility] Patient Information 2015 [place], [place]. This information is not intended to replace advice given to you by your health care provider. Make sure you discuss any questions you have with your health care provider. Shortness of Breath Shortness of breath means you have trouble breathing. It could also mean that you have a medical problem. You should get immediate medical care for shortness of breath. CAUSES   Not enough oxygen in the air such as with high altitudes or a smoke-filled room.  Certain lung diseases, infections, or problems.  Heart disease or conditions, such as angina or heart failure.  Low red blood cells (anemia).  Poor physical fitness, which can cause shortness  of breath when you exercise.  Chest or back injuries or stiffness.  Being overweight.  Smoking.  Anxiety, which can make you feel like you are not getting enough air. DIAGNOSIS  Serious medical problems can often be found during your physical exam. Tests may also be done to determine why you are having shortness of breath. Tests may include:  Chest X-rays.  Lung function tests.  Blood tests.  An electrocardiogram (ECG).  An ambulatory electrocardiogram. An ambulatory ECG records your heartbeat patterns over a 24-hour period.  Exercise testing.  A transthoracic echocardiogram (TTE). During echocardiography, sound waves are used to evaluate how blood flows through your heart.  A transesophageal echocardiogram (TEE).  Imaging scans. Your health care provider may not be able to find a cause for your shortness of breath after your exam. In this case, it is important to have a follow-up exam with your  health care provider as directed.  TREATMENT  Treatment for shortness of breath depends on the cause of your symptoms and can vary greatly. HOME CARE INSTRUCTIONS   Do not smoke. Smoking is a common cause of shortness of breath. If you smoke, ask for help to quit.  Avoid being around chemicals or things that may bother your breathing, such as paint fumes and dust.  Rest as needed. Slowly resume your usual activities.  If medicines were prescribed, take them as directed for the full length of time directed. This includes oxygen and any inhaled medicines.  Keep all follow-up appointments as directed by your health care provider. SEEK MEDICAL CARE IF:   Your condition does not improve in the time expected.  You have a hard time doing your normal activities even with rest.  You have any new symptoms. SEEK IMMEDIATE MEDICAL CARE IF:   Your shortness of breath gets worse.  You feel light-headed, faint, or develop a cough not controlled with medicines.  You start coughing up blood.  You have pain with breathing.  You have chest pain or pain in your arms, shoulders, or abdomen.  You have a fever.  You are unable to walk up stairs or exercise the way you normally do. MAKE SURE YOU:  Understand these instructions.  Will watch your condition.  Will get help right away if you are not doing well or get worse. Document Released: 06/11/2001 Document Revised: 09/21/2013 Document Reviewed: 12/02/2011 [facility] Patient Information 2015 [place], [place]. This information is not intended to replace advice given to you by your health care provider. Make sure you discuss any questions you have with your health care provider.

## 2014-11-16 NOTE — ED Notes (Signed)
Pt reports having left side mid back pain x 2 days, reports feeling like her legs are swelling and mild sob. This pain is similar to pericardial tamponade in past, also has chronic back pain. Ambulatory at triage. Denies urinary symptoms.

## 2014-11-17 ENCOUNTER — Encounter (HOSPITAL_COMMUNITY)
Admission: RE | Admit: 2014-11-17 | Discharge: 2014-11-17 | Disposition: A | Discharge status: Home / Self Care | Payer: BLUE CROSS/BLUE SHIELD | Source: Ambulatory Visit | Attending: Internal Medicine | Admitting: Internal Medicine

## 2014-11-17 ENCOUNTER — Telehealth (HOSPITAL_COMMUNITY): Payer: Self-pay | Admitting: *Deleted

## 2014-11-17 DIAGNOSIS — Z95810 Presence of automatic (implantable) cardiac defibrillator: Secondary | ICD-10-CM | POA: Insufficient documentation

## 2014-11-17 DIAGNOSIS — I10 Essential (primary) hypertension: Secondary | ICD-10-CM | POA: Insufficient documentation

## 2014-11-17 DIAGNOSIS — I509 Heart failure, unspecified: Secondary | ICD-10-CM | POA: Insufficient documentation

## 2014-11-17 DIAGNOSIS — J449 Chronic obstructive pulmonary disease, unspecified: Secondary | ICD-10-CM | POA: Insufficient documentation

## 2014-11-17 DIAGNOSIS — Z5189 Encounter for other specified aftercare: Secondary | ICD-10-CM | POA: Insufficient documentation

## 2014-11-17 DIAGNOSIS — Z8673 Personal history of transient ischemic attack (TIA), and cerebral infarction without residual deficits: Secondary | ICD-10-CM | POA: Insufficient documentation

## 2014-11-17 DIAGNOSIS — J45909 Unspecified asthma, uncomplicated: Secondary | ICD-10-CM | POA: Insufficient documentation

## 2014-11-17 DIAGNOSIS — E118 Type 2 diabetes mellitus with unspecified complications: Secondary | ICD-10-CM | POA: Insufficient documentation

## 2014-11-17 DIAGNOSIS — E669 Obesity, unspecified: Secondary | ICD-10-CM | POA: Insufficient documentation

## 2014-11-17 NOTE — Telephone Encounter (Signed)
-----   Message from Micheline ChapmanMichael D Cooper, MD sent at 11/17/2014 11:58 AM EST ----- Regarding: RE: OK to restart Cardiac Rehab Ok thx for the update ----- Message -----    From: Chelsea Ausarlette B Carlton, RN    Sent: 11/17/2014   9:22 AM      To: Beatrice LecherScott T Weaver, PA-C, Micheline ChapmanMichael D Cooper, MD Subject: OK to restart Cardiac Rehab                     Pt in today for brief orientation prior to restarting CR now that she has transportation. ([place]).  Pt noted on yesterday and ER visit for sob and back pain very similar to pleural effusion symptoms in the past.  Xray and lab work negative and pt advised to follow up with her Cardiologist office this/next week.  Appointment made for her to be seen on Monday afternoon(Scott Alben SpittleWeaver).  Will hold her restart to Wednesday pending your assessment/follow up in the office.  If your findings suggest continued hold for exercise, please let us know.   Thanks  PepsiCoCarlette Carlton RN

## 2014-11-17 NOTE — Progress Notes (Signed)
Cardiac Rehab Medication Review by a Pharmacist  Does the patient  feel that his/her medications are working for him/her?  yes  Has the patient been experiencing any side effects to the medications prescribed?  Yes - possible tremors from mexitil?  Does the patient measure his/her own blood pressure or blood glucose at home?  yes - BP every morning and evening prior to metoprolol and PRN  Does the patient have any problems obtaining medications due to transportation or finances?   no  Understanding of regimen: excellent Understanding of indications: excellent Potential of compliance: excellent - patient sets alarm on phone, and has pill box    Pharmacist comments:  Pleasant 44 yo F presented to cardiac rehab orientation in good spirits, walking unassisted. Patient has great understanding of the medications she is taking and each of their indications. She explains that she never misses her medication due to have a very good routine and system. All questions were answered.   Thank you for allowing pharmacy to be part of this patient's care team  Wesam Gearhart M. Laquinton Bihm, Pharm.D Clinical Pharmacy Resident Pager: [phone] 11/17/2014 .8:33 AM

## 2014-11-21 ENCOUNTER — Encounter: Payer: Self-pay | Admitting: Physician Assistant

## 2014-11-21 ENCOUNTER — Encounter (HOSPITAL_COMMUNITY): Payer: BLUE CROSS/BLUE SHIELD

## 2014-11-21 ENCOUNTER — Encounter: Payer: BLUE CROSS/BLUE SHIELD | Admitting: Physician Assistant

## 2014-11-21 ENCOUNTER — Ambulatory Visit (INDEPENDENT_AMBULATORY_CARE_PROVIDER_SITE_OTHER): Payer: BLUE CROSS/BLUE SHIELD | Admitting: Physician Assistant

## 2014-11-21 VITALS — BP 90/60 | HR 75 | Ht 60.0 in | Wt 233.0 lb

## 2014-11-21 DIAGNOSIS — I472 Ventricular tachycardia, unspecified: Secondary | ICD-10-CM

## 2014-11-21 DIAGNOSIS — Z9889 Other specified postprocedural states: Secondary | ICD-10-CM

## 2014-11-21 DIAGNOSIS — I319 Disease of pericardium, unspecified: Secondary | ICD-10-CM

## 2014-11-21 DIAGNOSIS — I421 Obstructive hypertrophic cardiomyopathy: Secondary | ICD-10-CM

## 2014-11-21 DIAGNOSIS — Z95810 Presence of automatic (implantable) cardiac defibrillator: Secondary | ICD-10-CM

## 2014-11-21 DIAGNOSIS — I471 Supraventricular tachycardia: Secondary | ICD-10-CM

## 2014-11-21 DIAGNOSIS — I5042 Chronic combined systolic (congestive) and diastolic (congestive) heart failure: Secondary | ICD-10-CM

## 2014-11-21 DIAGNOSIS — M549 Dorsalgia, unspecified: Secondary | ICD-10-CM

## 2014-11-21 DIAGNOSIS — I313 Pericardial effusion (noninflammatory): Secondary | ICD-10-CM

## 2014-11-21 DIAGNOSIS — I3139 Other pericardial effusion (noninflammatory): Secondary | ICD-10-CM

## 2014-11-21 NOTE — Progress Notes (Signed)
Cardiology Office Note   Date:  11/21/2014   ID:  [name], DOB 05-17-1971, MRN [id number]  PCP:  Eustaquio Boyden, MD  Cardiologist:  Dr. Tonny Bollman   Electrophysiologist:  Dr. Lewayne Bunting   Chief Complaint  Patient presents with  . Atrial Fibrillation     History of Present Illness: [name] is a 44 y.o. female with a hx of HOCM status post septal myomectomy and mitral valve repair at [facility] in 12/2011, chronic combined systolic and diastolic HF.  Postoperative course was complicated by ventricular tachycardia, atrial fibrillation and left upper extremity DVT. AICD was implanted. She had necrotic skin reaction on Coumadin and was transitioned to Pradaxa >> then Eliquis. 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 She ultimately underwent pericardial window by Dr. Cornelius Moras. She has had refractory VT/VF and atrial arrhythmias.  She has failed medical Rx with multiple antiarrhythmics.  She has had multiple episodes of appropriate and inappropriate ICD shocks.    Admitted 10/2013 with ICD shock in the setting of AFlutter >> s/p RFCA. Admitted 12/2013 with atrial tachycardia. Admitted in 08/2014 for ICD shock in the setting of ATach.  Beta blocker was adjusted.    I saw her earlier this month after a trip to the emergency room with chest discomfort. This was around her ICD site. She was then seen in the emergency room on 11/16/14 with left-sided back pain. This was in the same area that she had discomfort when she had hemorrhagic pericardial effusion that required pericardial window. BNP was slightly elevated at 186. Chest x-ray was unremarkable. Cardiac enzymes were normal. Since going to the emergency room, her back pain is improved. It comes and goes. She denies any aggravating or alleviating factors. She describes it as an ache. She remains short of breath with exertion. She is NYHA 2b. She denies orthopnea, PND. She does note some LE edema  that  seems to be increasing. She denies chest discomfort or syncope. She denies any ICD discharges. She denies pleuritic chest pain or chest pain with lying supine.    Studies/Reports Reviewed Today:   - Echocardiogram (11/2013):  HOCM with prior septal myomectomy, moderate LVH, EF 40-45%, trivial AI, mitral valve s/p repair with a small diastolic gradient, no MS (mean 9 mmHg, peak 16 mmHg), trivial MR, moderate LAE     Past Medical History  Diagnosis Date  . Hypertrophic cardiomyopathy     s/p septal myomectomy with implantable defibrillator 01/27/2012   . Anxiety state, unspecified   . Allergic rhinitis     to dogs  . Asthma   . Anemia     during pregnancy  . History of chicken pox   . Generalized headaches   . Ocular migraine     no HA  . Back pain     told cracked bone, vicodin didn't help, improved with percocets/muscle relaxants, no eval by prior PCP  . 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 DVT (deep venous thrombosis) 01/2012    was placed on Warfarin after cardiac surgery but had skin reaction to warfarin so was changed to Pradaxa so DVT occurred while on Pradaxa although unclear if it occurred during transition  . Hypotension   . Atrial fibrillation     a. On pradaxa.  coumadin necrosis with warfarin;  b. Amio d/c'd 02/2012  . Pericardial effusion   . Chronic combined systolic and  diastolic CHF (congestive heart failure)     a. echo 03/19/12: severe asymmetric septal hypertrophy, evidence of upper septal myomectomy, peak LV outflow tract gradient 35, EF 35%,;  b.  Echo 9/13: Severe LVH, EF 30-35%, anteroseptal and inferior HK, LVOT narrow, mild AI, mild to moderate mitral stenosis, severe LAE  . Pericardial tamponade 03/14/2012  . 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 01/2012 s/p BSX ICD;  b. 06/2012 Multaq initiated due to recurrent syncope, VT/VF - had severe n/v with IV amio.  . Skin necrosis --COUMADIN   .  Chronic chest wall pain     eval by CVTS, stable, rec return to [facility] if continued pain  . Syncope     a. in setting of VT/VF.  Marland Kitchen Hypothyroid   . Blood transfusion without reported diagnosis   . Neuromuscular disorder   . Gall stones   . Atrial flutter 10/2013    with inappropriate ICD therapy; s/p CTI ablation by Dr Ladona Ridgel 11/16/2013  . PONV (postoperative nausea and vomiting)   . Depression   . Post-traumatic stress   . Fibromyalgia     Past Surgical History  Procedure Laterality Date  . Cesarean section  1991  . Induced abortion  1990 and 1993  . Cardiac surgery  01/27/2012    median sternotomy, septal myectomy, MVR - [facility] (Dr. Silvestre Mesi)  . Cardiac defibrillator placement  01/2012    BSX dual chamber ICD  . Myomectomy    . Pericardial window  03/14/2012    Procedure: PERICARDIAL WINDOW;  Surgeon: Purcell Nails, MD;  Location: [facility] OR;  Service: Open Heart Surgery;  Laterality: N/A;  Subxyphoid Pericardial  Window   . Ablation  11-16-13    RFCA of atrial flutter by Dr Ladona Ridgel   . Insert / replace / remove pacemaker    . Mitral valve repair    . Cholecystectomy N/A 01/26/2014    LAPAROSCOPIC CHOLECYSTECTOMY WITH INTRAOPERATIVE CHOLANGIOGRAM;  Surgeon: Wilmon Arms. Tsuei, MD  . Atrial flutter ablation N/A 11/16/2013    Procedure: ATRIAL FLUTTER ABLATION;  Surgeon: Marinus Maw, MD;  Location: [facility] CATH LAB;  Service: Cardiovascular;  Laterality: N/A;     Current Outpatient Prescriptions  Medication Sig Dispense Refill  . acetaminophen (TYLENOL) 500 MG tablet Take 500-1,000 mg by mouth 2 (two) times daily as needed for mild pain, moderate pain, fever or headache (pain).     Marland Kitchen albuterol (PROVENTIL HFA;VENTOLIN HFA) 108 (90 BASE) MCG/ACT inhaler Inhale 1 puff into the lungs every 4 (four) hours as needed for wheezing or shortness of breath.     . ALPRAZolam (XANAX) 1 MG tablet Take 1 tablet (1 mg total) by mouth 3 (three) times daily as needed for anxiety. 70 tablet 0  . amoxicillin  (AMOXIL) 500 MG capsule Take 4 capsules (2,000 mg total) by mouth once. For dental procedure 12 capsule 0  . apixaban (ELIQUIS) 5 MG TABS tablet Take 1 tablet (5 mg total) by mouth 2 (two) times daily. 180 tablet 3  . beclomethasone (QVAR) 80 MCG/ACT inhaler Inhale 1 puff into the lungs 2 (two) times daily. 9am and 8pm    . cetirizine (ZYRTEC) 10 MG tablet Take 10 mg by mouth daily.     Marland Kitchen docusate sodium (COLACE) 100 MG capsule Take 100 mg by mouth 2 (two) times daily as needed for mild constipation.    . furosemide (LASIX) 40 MG tablet Take 1 tablet (40 mg total) by mouth daily. (Patient taking differently: Take 40  mg by mouth daily. Patient was told that if she has increased swelling to take 2 tablets) 90 tablet 3  . gabapentin (NEURONTIN) 300 MG capsule TAKE TWO CAPSULES BY MOUTH THREE TIMES DAILY 180 capsule 0  . levonorgestrel (MIRENA) 20 MCG/24HR IUD 1 each by Intrauterine route once.    Marland Kitchen. levothyroxine (SYNTHROID, LEVOTHROID) 50 MCG tablet TAKE ONE TABLET BY MOUTH ONCE DAILY BEFORE BREAKFAST 30 tablet 3  . magnesium gluconate (MAGONATE) 500 MG tablet Take 250 mg by mouth 2 (two) times daily.    . metoprolol succinate (TOPROL-XL) 50 MG 24 hr tablet Take 50 mg by mouth 2 (two) times daily.   0  . mexiletine (MEXITIL) 150 MG capsule Take 2 capsules (300 mg total) by mouth 3 (three) times daily. 540 capsule 3  . Olopatadine HCl (PATANASE) 0.6 % SOLN Place 1 puff into the nose 2 (two) times daily.    Marland Kitchen. oxyCODONE-acetaminophen (PERCOCET) 7.5-325 MG per tablet Take 1 tablet by mouth every 4 (four) hours as needed.   0  . Potassium Gluconate 595 MG CAPS Take 1 capsule (595 mg total) by mouth daily. OK to take extra on days you need extra lasix 100 capsule 3  . ranolazine (RANEXA) 1000 MG SR tablet Take 1 tablet (1,000 mg total) by mouth 2 (two) times daily. 180 tablet 3  . sertraline (ZOLOFT) 50 MG tablet Take 1 tablet (50 mg total) by mouth daily. 30 tablet 3   No current facility-administered  medications for this visit.    Allergies:   Amiodarone; Phenergan; Warfarin and related; and Nitroglycerin    Social History:  The patient  reports that she quit smoking about 26 years ago. Her smoking use included Cigarettes. She has a 3 pack-year smoking history. She has never used smokeless tobacco. She reports that she does not drink alcohol or use illicit drugs.   Family History:  The patient's family history includes Anemia in her mother; Cancer in her paternal grandfather; Diabetes in her father and paternal grandfather; Heart disease in her father and maternal grandfather; Hyperlipidemia in her maternal grandfather; Stroke in her father; Thyroid disease in her mother. There is no history of Coronary artery disease, Sudden death, or Heart attack.    ROS:  Please see the history of present illness.    Review of Systems  Constitution: Positive for chills, malaise/fatigue and weight gain.  HENT: Positive for headaches.   Cardiovascular: Positive for palpitations.  Musculoskeletal: Positive for back pain.  Neurological: Positive for dizziness.  Psychiatric/Behavioral: Positive for depression. The patient is nervous/anxious.   All other systems reviewed and are negative.    PHYSICAL EXAM: VS:  BP 90/60 mmHg  Pulse 75  Ht 5' (1.524 m)  Wt 233 lb (105.688 kg)  BMI 45.50 kg/m2    Wt Readings from Last 3 Encounters:  11/21/14 233 lb (105.688 kg)  11/16/14 230 lb (104.327 kg)  11/03/14 228 lb (103.42 kg)     GEN: Well nourished, well developed, in no acute distress HEENT: normal Neck: no JVD, no masses Cardiac:  Normal S1/S2, RRR; 2/6 systolic murmur LSB, no rubs or gallops, no edema  Respiratory:  clear to auscultation bilaterally, no wheezing, rhonchi or rales. GI: soft, nontender, nondistended, + BS MS: no deformity or atrophy Skin: warm and dry  Neuro:  CNs II-XII intact, Strength and sensation are intact Psych: Normal affect   EKG:  EKG is ordered today.  It  demonstrates:   Atrial paced, HR 75, LBBB  Recent Labs: 03/14/2014: ALT 26 08/31/2014: Magnesium 2.1; Pro B Natriuretic peptide (BNP) 723.7*; TSH 3.211 11/16/2014: B Natriuretic Peptide 186.1*; BUN 18; Creatinine 0.91; Hemoglobin 13.9; Platelets 251; Potassium 4.2; Sodium 139    Lipid Panel    Component Value Date/Time   CHOL 192 08/16/2013 0823   TRIG 152.0* 08/16/2013 0823   HDL 49.10 08/16/2013 0823   CHOLHDL 4 08/16/2013 0823   VLDL 30.4 08/16/2013 0823   LDLCALC 113* 08/16/2013 0823      ASSESSMENT AND PLAN:  1.  Back Pain:  This reminds her of the symptoms she had with her effusion.  It has been almost 1 year since her last echo.  It is not unreasonable to repeat this to rule out re-accumulation of her effusion.    -  Schedule 2-D Echo. 2.  Atrial Arrhythmias:  Maintaining sinus rhythm. She remains on Eliquis. 3.  Ventricular tachycardia/Ventricular Fibrillation:  No further episodes. She remains on mexiletine and Ranexa. 4.  S/p AICD:  Follow-up with EP as planned. 5.  Hypertrophic Obstructive Cardiomyopathy, s/p Septal Myomectomy:  Repeat echo as noted. 6.  S/p Mitral Valve Repair:  Repeat Echo as noted. 7.  Chronic Combined Systolic and Diastolic CHF:  She has some symptoms of volume excess.  BNP minimally elevated.    -  Take Extra Lasix and K+ today and tomorrow.    -  Repeat Echo as noted.   Current medicines are reviewed at length with the patient today.  The patient does not have concerns regarding medicines.  The following changes have been made:  As above.   Labs/ tests ordered today include:   Orders Placed This Encounter  Procedures  . EKG 12-Lead  . 2D Echocardiogram with contrast    Disposition:   FU with Dr. Tonny Bollman as planned.  I have advised her to remain out of cardiac rehab until her echo is done.   Signed, Brynda Rim, MHS 11/21/2014 1:22 PM    [facility] [address] [place], [place], [place]  [address] Phone:  [phone]; Fax: [phone]

## 2014-11-21 NOTE — Patient Instructions (Signed)
Your physician has requested that you have an echocardiogram WITH CONTRAST. Echocardiography is a painless test that uses sound waves to create images of your heart. It provides your doctor with information about the size and shape of your heart and how well your heart's chambers and valves are working. This procedure takes approximately one hour. There are no restrictions for this procedure.  INCREASE LASIX TWICE DAILY TODAY AND TOMORROW INCREASE POTASSIUM TWICE DAILY TODAY AND TOMORROW  THEN RESUME CURRENT DOSE OF LASIX AND POTASSIUM  KEEP YOUR FOLLOW UP WITH DR. Excell SeltzerOOPER 01/2015

## 2014-11-23 ENCOUNTER — Encounter (HOSPITAL_COMMUNITY): Payer: BLUE CROSS/BLUE SHIELD

## 2014-11-23 ENCOUNTER — Telehealth (HOSPITAL_COMMUNITY): Payer: Self-pay | Admitting: *Deleted

## 2014-11-23 NOTE — Telephone Encounter (Signed)
-----   Message from Beatrice LecherScott T Weaver, [place]PA-C sent at 11/21/2014  5:40 PM EST ----- Regarding: RE: OK to restart Cardiac Rehab I am getting an echo. I told her to hold off until she gets her echo done. Will let you know. Scott  ----- Message -----    From: Chelsea Ausarlette B Carlton, RN    Sent: 11/17/2014   9:22 AM      To: Kennon RoundsScott T Weaver, PA-C, Micheline ChapmanMichael D Cooper, MD Subject: OK to restart Cardiac Rehab                     Pt in today for brief orientation prior to restarting CR now that she has transportation. ([place]).  Pt noted on yesterday and ER visit for sob and back pain very similar to pleural effusion symptoms in the past.  Xray and lab work negative and pt advised to follow up with her Cardiologist office this/next week.  Appointment made for her to be seen on Monday afternoon(Scott Alben SpittleWeaver).  Will hold her restart to Wednesday pending your assessment/follow up in the office.  If your findings suggest continued hold for exercise, please let us know.   Thanks  [employer]epsi[employer]Carlette Carlton RN

## 2014-11-24 ENCOUNTER — Ambulatory Visit (HOSPITAL_COMMUNITY): Payer: BLUE CROSS/BLUE SHIELD | Attending: Cardiology | Admitting: Radiology

## 2014-11-24 ENCOUNTER — Encounter: Payer: Self-pay | Admitting: Physician Assistant

## 2014-11-24 ENCOUNTER — Other Ambulatory Visit: Payer: Self-pay

## 2014-11-24 DIAGNOSIS — I421 Obstructive hypertrophic cardiomyopathy: Secondary | ICD-10-CM | POA: Diagnosis present

## 2014-11-24 NOTE — Progress Notes (Signed)
Echocardiogram performed.  

## 2014-11-25 ENCOUNTER — Encounter (HOSPITAL_COMMUNITY): Payer: BLUE CROSS/BLUE SHIELD

## 2014-11-28 ENCOUNTER — Encounter (HOSPITAL_COMMUNITY): Admission: RE | Admit: 2014-11-28 | Payer: BLUE CROSS/BLUE SHIELD | Source: Ambulatory Visit

## 2014-11-30 ENCOUNTER — Encounter (HOSPITAL_COMMUNITY): Payer: BLUE CROSS/BLUE SHIELD

## 2014-12-02 ENCOUNTER — Encounter (HOSPITAL_COMMUNITY): Payer: BLUE CROSS/BLUE SHIELD

## 2014-12-02 ENCOUNTER — Telehealth (HOSPITAL_COMMUNITY): Payer: Self-pay | Admitting: Cardiac Rehabilitation

## 2014-12-02 NOTE — Telephone Encounter (Signed)
Pt informed

## 2014-12-02 NOTE — Telephone Encounter (Signed)
-----   Message from Beatrice LecherScott T Weaver, [place]-C sent at 12/01/2014  1:40 PM EST ----- Regarding: RE: cardiac rehab Reviewed echo with Dr. Tonny BollmanMichael Cooper She is ok to return to cardiac rehab. Tereso NewcomerScott Weaver, PA-C   12/01/2014 1:41 PM    ----- Message -----    From: Robyne PeersJoann H Rion, RN    Sent: 11/28/2014   8:11 AM      To: Beatrice LecherScott T Weaver, PA-C Subject: cardiac rehab                                  Dear Lorin PicketScott,  Please let me know when pt can begin cardiac rehab.   Thank you, Deveron FurlongJoann Rion, RN, BSN Cardiac Pulmonary Rehab

## 2014-12-05 ENCOUNTER — Encounter (HOSPITAL_COMMUNITY)
Admission: RE | Admit: 2014-12-05 | Discharge: 2014-12-05 | Disposition: A | Discharge status: Home / Self Care | Payer: BLUE CROSS/BLUE SHIELD | Source: Ambulatory Visit | Attending: Internal Medicine | Admitting: Internal Medicine

## 2014-12-05 ENCOUNTER — Encounter (HOSPITAL_COMMUNITY): Payer: Self-pay

## 2014-12-05 DIAGNOSIS — J45909 Unspecified asthma, uncomplicated: Secondary | ICD-10-CM | POA: Insufficient documentation

## 2014-12-05 DIAGNOSIS — I509 Heart failure, unspecified: Secondary | ICD-10-CM | POA: Diagnosis not present

## 2014-12-05 DIAGNOSIS — Z8673 Personal history of transient ischemic attack (TIA), and cerebral infarction without residual deficits: Secondary | ICD-10-CM | POA: Diagnosis not present

## 2014-12-05 DIAGNOSIS — I10 Essential (primary) hypertension: Secondary | ICD-10-CM | POA: Diagnosis not present

## 2014-12-05 DIAGNOSIS — J449 Chronic obstructive pulmonary disease, unspecified: Secondary | ICD-10-CM | POA: Insufficient documentation

## 2014-12-05 DIAGNOSIS — Z95810 Presence of automatic (implantable) cardiac defibrillator: Secondary | ICD-10-CM | POA: Insufficient documentation

## 2014-12-05 DIAGNOSIS — Z5189 Encounter for other specified aftercare: Secondary | ICD-10-CM | POA: Insufficient documentation

## 2014-12-05 DIAGNOSIS — E669 Obesity, unspecified: Secondary | ICD-10-CM | POA: Diagnosis not present

## 2014-12-05 DIAGNOSIS — E118 Type 2 diabetes mellitus with unspecified complications: Secondary | ICD-10-CM | POA: Insufficient documentation

## 2014-12-05 NOTE — Progress Notes (Signed)
Pt started cardiac rehab today.  Pt tolerated light exercise without difficulty.  VSS, telemetry-sinus rhythm, first degree AV block, wide QRS.  PHQ-11 which is down from 13 a few months ago. Pt reports she is currently being treated for depression with stable symptoms. Pt reports most of her depression is health related due to her inability to participate in activities as prior to her illness. Pt often has to decline participation in outings with family and friends due to her physical inability to participate.  Pt also enjoys running with her 4 dogs, 2 of which are very large breed. She is also physically unable to participate in this activity. Pt offered emotional support and reassurance.  [place]  Pt goals for cardiac rehab are to lose weight and feel stronger with more independence.  Pt oriented to exercise equipment and routine.  Understanding verbalized.

## 2014-12-07 ENCOUNTER — Encounter (HOSPITAL_COMMUNITY)
Admission: RE | Admit: 2014-12-07 | Discharge: 2014-12-07 | Disposition: A | Discharge status: Home / Self Care | Payer: BLUE CROSS/BLUE SHIELD | Source: Ambulatory Visit | Attending: Internal Medicine | Admitting: Internal Medicine

## 2014-12-07 DIAGNOSIS — Z5189 Encounter for other specified aftercare: Secondary | ICD-10-CM | POA: Diagnosis not present

## 2014-12-08 ENCOUNTER — Other Ambulatory Visit: Payer: Self-pay | Admitting: Family Medicine

## 2014-12-08 ENCOUNTER — Other Ambulatory Visit: Payer: Self-pay | Admitting: Neurology

## 2014-12-09 ENCOUNTER — Encounter (HOSPITAL_COMMUNITY)
Admission: RE | Admit: 2014-12-09 | Discharge: 2014-12-09 | Disposition: A | Discharge status: Home / Self Care | Payer: BLUE CROSS/BLUE SHIELD | Source: Ambulatory Visit | Attending: Internal Medicine | Admitting: Internal Medicine

## 2014-12-09 DIAGNOSIS — Z5189 Encounter for other specified aftercare: Secondary | ICD-10-CM | POA: Diagnosis not present

## 2014-12-12 ENCOUNTER — Telehealth: Payer: Self-pay | Admitting: Internal Medicine

## 2014-12-12 ENCOUNTER — Emergency Department (HOSPITAL_COMMUNITY): Payer: BLUE CROSS/BLUE SHIELD

## 2014-12-12 ENCOUNTER — Encounter (HOSPITAL_COMMUNITY): Payer: BLUE CROSS/BLUE SHIELD

## 2014-12-12 ENCOUNTER — Encounter (HOSPITAL_COMMUNITY): Payer: Self-pay | Admitting: Emergency Medicine

## 2014-12-12 ENCOUNTER — Inpatient Hospital Stay (HOSPITAL_COMMUNITY)
Admission: EM | Admit: 2014-12-12 | Discharge: 2014-12-17 | DRG: 309 | Disposition: A | Discharge status: Home / Self Care | Payer: BLUE CROSS/BLUE SHIELD | Attending: Cardiovascular Disease | Admitting: Cardiovascular Disease

## 2014-12-12 DIAGNOSIS — Z95810 Presence of automatic (implantable) cardiac defibrillator: Secondary | ICD-10-CM | POA: Diagnosis present

## 2014-12-12 DIAGNOSIS — Z87891 Personal history of nicotine dependence: Secondary | ICD-10-CM

## 2014-12-12 DIAGNOSIS — F329 Major depressive disorder, single episode, unspecified: Secondary | ICD-10-CM | POA: Diagnosis present

## 2014-12-12 DIAGNOSIS — R55 Syncope and collapse: Secondary | ICD-10-CM | POA: Diagnosis present

## 2014-12-12 DIAGNOSIS — F411 Generalized anxiety disorder: Secondary | ICD-10-CM | POA: Diagnosis present

## 2014-12-12 DIAGNOSIS — I421 Obstructive hypertrophic cardiomyopathy: Secondary | ICD-10-CM | POA: Diagnosis present

## 2014-12-12 DIAGNOSIS — Z888 Allergy status to other drugs, medicaments and biological substances status: Secondary | ICD-10-CM

## 2014-12-12 DIAGNOSIS — E039 Hypothyroidism, unspecified: Secondary | ICD-10-CM | POA: Diagnosis present

## 2014-12-12 DIAGNOSIS — I4892 Unspecified atrial flutter: Secondary | ICD-10-CM | POA: Diagnosis present

## 2014-12-12 DIAGNOSIS — Z9049 Acquired absence of other specified parts of digestive tract: Secondary | ICD-10-CM | POA: Diagnosis present

## 2014-12-12 DIAGNOSIS — J45909 Unspecified asthma, uncomplicated: Secondary | ICD-10-CM | POA: Diagnosis present

## 2014-12-12 DIAGNOSIS — I480 Paroxysmal atrial fibrillation: Secondary | ICD-10-CM | POA: Diagnosis present

## 2014-12-12 DIAGNOSIS — I472 Ventricular tachycardia, unspecified: Secondary | ICD-10-CM

## 2014-12-12 DIAGNOSIS — Z86718 Personal history of other venous thrombosis and embolism: Secondary | ICD-10-CM | POA: Diagnosis not present

## 2014-12-12 DIAGNOSIS — E876 Hypokalemia: Secondary | ICD-10-CM | POA: Diagnosis present

## 2014-12-12 DIAGNOSIS — I429 Cardiomyopathy, unspecified: Secondary | ICD-10-CM | POA: Diagnosis present

## 2014-12-12 DIAGNOSIS — R110 Nausea: Secondary | ICD-10-CM | POA: Diagnosis present

## 2014-12-12 DIAGNOSIS — I959 Hypotension, unspecified: Secondary | ICD-10-CM | POA: Diagnosis present

## 2014-12-12 DIAGNOSIS — Z6841 Body Mass Index (BMI) 40.0 and over, adult: Secondary | ICD-10-CM | POA: Diagnosis not present

## 2014-12-12 DIAGNOSIS — I5042 Chronic combined systolic (congestive) and diastolic (congestive) heart failure: Secondary | ICD-10-CM | POA: Diagnosis present

## 2014-12-12 DIAGNOSIS — Z952 Presence of prosthetic heart valve: Secondary | ICD-10-CM

## 2014-12-12 DIAGNOSIS — R112 Nausea with vomiting, unspecified: Secondary | ICD-10-CM | POA: Diagnosis present

## 2014-12-12 DIAGNOSIS — I422 Other hypertrophic cardiomyopathy: Secondary | ICD-10-CM

## 2014-12-12 LAB — CBC
HCT: 43.2 % (ref 36.0–46.0)
Hemoglobin: 14.4 g/dL (ref 12.0–15.0)
MCH: 30.8 pg (ref 26.0–34.0)
MCHC: 33.3 g/dL (ref 30.0–36.0)
MCV: 92.5 fL (ref 78.0–100.0)
Platelets: 268 10*3/uL (ref 150–400)
RBC: 4.67 MIL/uL (ref 3.87–5.11)
RDW: 13.3 % (ref 11.5–15.5)
WBC: 6 10*3/uL (ref 4.0–10.5)

## 2014-12-12 LAB — COMPREHENSIVE METABOLIC PANEL
ALBUMIN: 3.7 g/dL (ref 3.5–5.2)
ALT: 45 U/L — ABNORMAL HIGH (ref 0–35)
ANION GAP: 10 (ref 5–15)
AST: 45 U/L — AB (ref 0–37)
Alkaline Phosphatase: 52 U/L (ref 39–117)
BILIRUBIN TOTAL: 0.7 mg/dL (ref 0.3–1.2)
BUN: 11 mg/dL (ref 6–23)
CALCIUM: 8.9 mg/dL (ref 8.4–10.5)
CHLORIDE: 104 mmol/L (ref 96–112)
CO2: 23 mmol/L (ref 19–32)
CREATININE: 0.92 mg/dL (ref 0.50–1.10)
GFR calc Af Amer: 87 mL/min — ABNORMAL LOW (ref >90)
GFR calc non Af Amer: 75 mL/min — ABNORMAL LOW (ref >90)
Glucose, Bld: 140 mg/dL — ABNORMAL HIGH (ref 70–99)
Potassium: 4 mmol/L (ref 3.5–5.1)
Sodium: 137 mmol/L (ref 135–145)
TOTAL PROTEIN: 7.5 g/dL (ref 6.0–8.3)

## 2014-12-12 LAB — TROPONIN I: TROPONIN I: 0.09 ng/mL — AB (ref <0.031)

## 2014-12-12 LAB — MRSA PCR SCREENING: MRSA by PCR: NEGATIVE

## 2014-12-12 LAB — APTT: aPTT: 29 seconds (ref 24–37)

## 2014-12-12 LAB — I-STAT TROPONIN, ED: Troponin i, poc: 0.03 ng/mL (ref 0.00–0.08)

## 2014-12-12 LAB — BRAIN NATRIURETIC PEPTIDE: B Natriuretic Peptide: 431.7 pg/mL — ABNORMAL HIGH (ref 0.0–100.0)

## 2014-12-12 LAB — MAGNESIUM: Magnesium: 2.2 mg/dL (ref 1.5–2.5)

## 2014-12-12 LAB — TSH: TSH: 6.312 u[IU]/mL — ABNORMAL HIGH (ref 0.350–4.500)

## 2014-12-12 MED ORDER — ACETAMINOPHEN 325 MG PO TABS: 650.0000 mg | ORAL_TABLET | ORAL | Status: DC | PRN

## 2014-12-12 MED ORDER — SERTRALINE HCL 50 MG PO TABS
50.0000 mg | ORAL_TABLET | Freq: Every day | ORAL | Status: DC
  Administered 2014-12-13 – 2014-12-17 (×5): 50 mg via ORAL
  Filled 2014-12-12 (×5): qty 1

## 2014-12-12 MED ORDER — SODIUM CHLORIDE 0.9 % IJ SOLN
3.0000 mL | Freq: Two times a day (BID) | INTRAMUSCULAR | Status: DC
  Administered 2014-12-13 – 2014-12-15 (×3): 3 mL via INTRAVENOUS

## 2014-12-12 MED ORDER — SERTRALINE HCL 50 MG PO TABS: 50.0000 mg | ORAL_TABLET | Freq: Every day | ORAL | Status: DC

## 2014-12-12 MED ORDER — RISAQUAD PO CAPS
1.0000 | ORAL_CAPSULE | Freq: Every day | ORAL | Status: DC
  Administered 2014-12-13 – 2014-12-17 (×5): 1 via ORAL
  Filled 2014-12-12 (×5): qty 1

## 2014-12-12 MED ORDER — DOCUSATE SODIUM 100 MG PO CAPS
100.0000 mg | ORAL_CAPSULE | Freq: Two times a day (BID) | ORAL | Status: DC | PRN
  Filled 2014-12-12: qty 1

## 2014-12-12 MED ORDER — SODIUM CHLORIDE 0.9 % IV BOLUS (SEPSIS)
500.0000 mL | Freq: Once | INTRAVENOUS | Status: AC
  Administered 2014-12-12: 500 mL via INTRAVENOUS

## 2014-12-12 MED ORDER — RANOLAZINE ER 500 MG PO TB12
1000.0000 mg | ORAL_TABLET | Freq: Two times a day (BID) | ORAL | Status: DC
  Administered 2014-12-12 – 2014-12-17 (×9): 1000 mg via ORAL
  Filled 2014-12-12 (×14): qty 2

## 2014-12-12 MED ORDER — SODIUM CHLORIDE 0.9 % IV SOLN
250.0000 mL | INTRAVENOUS | Status: DC | PRN
  Administered 2014-12-12: 250 mL via INTRAVENOUS

## 2014-12-12 MED ORDER — ALBUTEROL SULFATE (2.5 MG/3ML) 0.083% IN NEBU
2.5000 mg | INHALATION_SOLUTION | RESPIRATORY_TRACT | Status: DC | PRN
  Administered 2014-12-17: 2.5 mg via RESPIRATORY_TRACT
  Filled 2014-12-12: qty 3

## 2014-12-12 MED ORDER — METOPROLOL TARTRATE 1 MG/ML IV SOLN: 5.0000 mg | INTRAVENOUS | Status: DC

## 2014-12-12 MED ORDER — MAGNESIUM GLUCONATE 500 MG PO TABS: 250.0000 mg | ORAL_TABLET | Freq: Every day | ORAL | Status: DC

## 2014-12-12 MED ORDER — OXYCODONE-ACETAMINOPHEN 7.5-325 MG PO TABS: 1.0000 | ORAL_TABLET | ORAL | Status: DC | PRN

## 2014-12-12 MED ORDER — METOPROLOL TARTRATE 1 MG/ML IV SOLN
2.5000 mg | INTRAVENOUS | Status: DC
  Administered 2014-12-12 – 2014-12-13 (×2): 2.5 mg via INTRAVENOUS
  Filled 2014-12-12 (×8): qty 5

## 2014-12-12 MED ORDER — ZOLPIDEM TARTRATE 5 MG PO TABS: 5.0000 mg | ORAL_TABLET | Freq: Every evening | ORAL | Status: DC | PRN

## 2014-12-12 MED ORDER — ONDANSETRON HCL 4 MG/2ML IJ SOLN: 4.0000 mg | Freq: Four times a day (QID) | INTRAMUSCULAR | Status: DC | PRN

## 2014-12-12 MED ORDER — APIXABAN 5 MG PO TABS
5.0000 mg | ORAL_TABLET | Freq: Two times a day (BID) | ORAL | Status: DC
  Administered 2014-12-12 – 2014-12-17 (×11): 5 mg via ORAL
  Filled 2014-12-12 (×14): qty 1

## 2014-12-12 MED ORDER — GABAPENTIN 300 MG PO CAPS
600.0000 mg | ORAL_CAPSULE | Freq: Three times a day (TID) | ORAL | Status: DC
  Filled 2014-12-12: qty 2

## 2014-12-12 MED ORDER — OXYCODONE HCL 5 MG PO TABS: 2.5000 mg | ORAL_TABLET | ORAL | Status: DC | PRN

## 2014-12-12 MED ORDER — PROBIOTIC DAILY PO CAPS: 1.0000 | ORAL_CAPSULE | Freq: Every day | ORAL | Status: DC

## 2014-12-12 MED ORDER — OXYCODONE-ACETAMINOPHEN 5-325 MG PO TABS: 1.0000 | ORAL_TABLET | ORAL | Status: DC | PRN

## 2014-12-12 MED ORDER — MAGNESIUM GLUCONATE 500 MG PO TABS
250.0000 mg | ORAL_TABLET | Freq: Every day | ORAL | Status: DC
  Administered 2014-12-13 – 2014-12-17 (×5): 250 mg via ORAL
  Filled 2014-12-12 (×9): qty 1

## 2014-12-12 MED ORDER — NITROGLYCERIN 0.4 MG SL SUBL: 0.4000 mg | SUBLINGUAL_TABLET | SUBLINGUAL | Status: DC | PRN

## 2014-12-12 MED ORDER — ALPRAZOLAM 0.5 MG PO TABS
1.0000 mg | ORAL_TABLET | Freq: Two times a day (BID) | ORAL | Status: DC
  Administered 2014-12-12 – 2014-12-17 (×9): 1 mg via ORAL
  Filled 2014-12-12 (×10): qty 2

## 2014-12-12 MED ORDER — ALBUTEROL SULFATE HFA 108 (90 BASE) MCG/ACT IN AERS
1.0000 | INHALATION_SPRAY | RESPIRATORY_TRACT | Status: DC | PRN
  Administered 2014-12-12: 1 via RESPIRATORY_TRACT
  Filled 2014-12-12: qty 6.7

## 2014-12-12 MED ORDER — MEXILETINE HCL 150 MG PO CAPS
300.0000 mg | ORAL_CAPSULE | Freq: Three times a day (TID) | ORAL | Status: DC
  Administered 2014-12-12 (×2): 300 mg via ORAL
  Filled 2014-12-12 (×6): qty 2

## 2014-12-12 MED ORDER — SODIUM CHLORIDE 0.9 % IV SOLN
INTRAVENOUS | Status: AC
  Administered 2014-12-12 (×2): via INTRAVENOUS

## 2014-12-12 MED ORDER — ALPRAZOLAM 0.25 MG PO TABS: 0.2500 mg | ORAL_TABLET | Freq: Two times a day (BID) | ORAL | Status: DC | PRN

## 2014-12-12 MED ORDER — GABAPENTIN 300 MG PO CAPS
600.0000 mg | ORAL_CAPSULE | Freq: Three times a day (TID) | ORAL | Status: DC
  Administered 2014-12-12 – 2014-12-17 (×15): 600 mg via ORAL
  Filled 2014-12-12 (×17): qty 2

## 2014-12-12 MED ORDER — LEVOTHYROXINE SODIUM 50 MCG PO TABS
50.0000 ug | ORAL_TABLET | Freq: Every day | ORAL | Status: DC
  Administered 2014-12-13 – 2014-12-17 (×5): 50 ug via ORAL
  Filled 2014-12-12 (×7): qty 1

## 2014-12-12 MED ORDER — FLUTICASONE PROPIONATE HFA 44 MCG/ACT IN AERO
1.0000 | INHALATION_SPRAY | Freq: Two times a day (BID) | RESPIRATORY_TRACT | Status: DC
  Administered 2014-12-13 – 2014-12-17 (×9): 1 via RESPIRATORY_TRACT
  Filled 2014-12-12 (×2): qty 10.6

## 2014-12-12 MED ORDER — ASPIRIN EC 81 MG PO TBEC: 81.0000 mg | DELAYED_RELEASE_TABLET | Freq: Every day | ORAL | Status: DC

## 2014-12-12 MED ORDER — ASPIRIN EC 81 MG PO TBEC
81.0000 mg | DELAYED_RELEASE_TABLET | Freq: Every day | ORAL | Status: DC
  Administered 2014-12-13 – 2014-12-17 (×5): 81 mg via ORAL
  Filled 2014-12-12 (×5): qty 1

## 2014-12-12 MED ORDER — AZELASTINE HCL 0.1 % NA SOLN
1.0000 | Freq: Two times a day (BID) | NASAL | Status: DC
  Administered 2014-12-13: 1 via NASAL
  Filled 2014-12-12 (×2): qty 30

## 2014-12-12 MED ORDER — LORATADINE 10 MG PO TABS
10.0000 mg | ORAL_TABLET | Freq: Every day | ORAL | Status: DC
  Administered 2014-12-15 – 2014-12-17 (×2): 10 mg via ORAL
  Filled 2014-12-12 (×6): qty 1

## 2014-12-12 MED ORDER — SODIUM CHLORIDE 0.9 % IJ SOLN: 3.0000 mL | INTRAMUSCULAR | Status: DC | PRN

## 2014-12-12 NOTE — ED Notes (Signed)
Pt refusing medications at this time and would like to wait until next scheduled doses.

## 2014-12-12 NOTE — ED Notes (Signed)
Attempted to call report

## 2014-12-12 NOTE — ED Notes (Signed)
Pt reports LOC, along with pt boyfriend stating he found her "slumped over." Upon coming to, pt felt nauseous, began vomiting, and also lost control of bowels. Pt states her hr was racing last night in the 180-190's. Was not shocked. Per pt she is paced at 75.

## 2014-12-12 NOTE — H&P (Signed)
CARDIOLOGY HISTORY AND PHYSICAL   Patient ID: [name] MRN: [id number] DOB/AGE: [birth year] 44 y.o.  Admit date: 12/12/2014  Primary Physician   Eustaquio Boyden, MD Primary Cardiologist   Dr. Excell Seltzer Electrophysiologist:  Dr. Ladona Ridgel  Reason for Consultation   Syncope   [name] is a 44 y.o. year old female with a history of HOCM (septal myomectomy and MV repair [facility] 2013), chronic S/D-CHF, VT, PAF, LUE DVT, [place] [facility] AICD, on Eliquis, peric window for post-pericardiotomy syndrome, VT/VF, failed multiple anti-arrhythmics, OK and inappropriate ICD shocks and chronic pain issues.  Admitted 10/2013 with ICD shock in the setting of AFlutter >> s/p RFCA. Admitted 12/2013 with atrial tachycardia. Admitted in 08/2014 for ICD shock in the setting of ATach. Beta blocker was adjusted.   Last o.v. 11/21/2014 w/ Tereso Newcomer, PA-C, slightly volume overloaded, was to take extra Lasix and K+ x 2 days.  Starting about 03/10, she was having intermittent diarrhea, poor po intake due to poor appetite, no S&S volume overload, no palpitations, no ICD shocks. Today, she went to the pain clinic and ran some errands. She went home and began feeling nauseated. She had diarrhea. She became light-headed and diaphoretic, but had no chest pain or palpitations. She passed out as she was getting ready to take her Mexilitene. When she woke, she was incontinent of bowel/bladder. Her boyfriend called 911. She was transported by EMS, initially called a STEMI because her ECG was so abnormal. Her HR and BP were OK. She was concerned because the diaphoresis reminded her of her VT episodes. She was given ASA 324 mg, Morphine 4 mg and Zofran 4 mg for her symptoms. Upon arrival, she is no longer diaphoretic, not SOB, still feels a little nauseated.  Past Medical History  Diagnosis Date  . Hypertrophic cardiomyopathy     s/p septal myomectomy with implantable defibrillator 01/27/2012   . Anxiety state,  unspecified   . Allergic rhinitis     to dogs  . Asthma   . Anemia     during pregnancy  . History of chicken pox   . Generalized headaches   . Ocular migraine     no HA  . Back pain     told cracked bone, vicodin didn't help, improved with percocets/muscle relaxants, no eval by prior PCP  . 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 DVT (deep venous thrombosis) 01/2012    was placed on Warfarin after cardiac surgery but had skin reaction to warfarin so was changed to Pradaxa so DVT occurred while on Pradaxa although unclear if it occurred during transition  . Hypotension   . Atrial fibrillation     a. On pradaxa.  coumadin necrosis with warfarin;  b. Amio d/c'd 02/2012  . Pericardial effusion   . Chronic combined systolic and diastolic CHF (congestive heart failure)     a. echo 03/19/12: severe asymmetric septal hypertrophy, evidence of upper septal myomectomy, peak LV outflow tract gradient 35, EF 35%,;  b.  Echo 9/13: Severe LVH, EF 30-35%, anteroseptal and inferior HK, LVOT narrow, mild AI, mild to moderate mitral stenosis, severe LAE  . Pericardial tamponade 03/14/2012  . 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 01/2012 s/p BSX ICD;  b. 06/2012 Multaq initiated due to recurrent syncope, VT/VF - had severe n/v with IV amio.  . Skin necrosis --COUMADIN   . Chronic chest wall pain  eval by CVTS, stable, rec return to [facility] if continued pain  . Syncope     a. in setting of VT/VF.  Marland Kitchen Hypothyroid   . Blood transfusion without reported diagnosis   . Neuromuscular disorder   . Gall stones   . Atrial flutter 10/2013    with inappropriate ICD therapy; s/p CTI ablation by Dr Ladona Ridgel 11/16/2013  . PONV (postoperative nausea and vomiting)   . Depression   . Post-traumatic stress   . Fibromyalgia   . Hx of echocardiogram     Echo (2/16): mod LVH, EF 55-60%, no RWMA, Gr 2 DD, mild AI, mild to mod MS, mod LAE     Past Surgical History    Procedure Laterality Date  . Cesarean section  1991  . Induced abortion  1990 and 1993  . Cardiac surgery  01/27/2012    median sternotomy, septal myectomy, MVR - [facility] (Dr. Silvestre Mesi)  . Cardiac defibrillator placement  01/2012    BSX dual chamber ICD  . Myomectomy    . Pericardial window  03/14/2012    Procedure: PERICARDIAL WINDOW;  Surgeon: Purcell Nails, MD;  Location: [facility] OR;  Service: Open Heart Surgery;  Laterality: N/A;  Subxyphoid Pericardial  Window   . Ablation  11-16-13    RFCA of atrial flutter by Dr Ladona Ridgel   . Insert / replace / remove pacemaker    . Mitral valve repair    . Cholecystectomy N/A 01/26/2014    LAPAROSCOPIC CHOLECYSTECTOMY WITH INTRAOPERATIVE CHOLANGIOGRAM;  Surgeon: Wilmon Arms. Tsuei, MD  . Atrial flutter ablation N/A 11/16/2013    Procedure: ATRIAL FLUTTER ABLATION;  Surgeon: Marinus Maw, MD;  Location: [facility] CATH LAB;  Service: Cardiovascular;  Laterality: N/A;    Allergies  Allergen Reactions  . Amiodarone Nausea And Vomiting    Amiodarone tablets cause nausea Can tolerate IV Amiodarone  . Phenergan [Promethazine Hcl] Itching  . Warfarin And Related Other (See Comments)    'burns my skin'  . Nitroglycerin Other (See Comments)    Hypotension    I have reviewed the patient's current medications Medication Sig  acetaminophen (TYLENOL) 500 MG tablet Take 500-1,000 mg by mouth 2 (two) times daily as needed for mild pain, moderate pain, fever or headache (pain).   albuterol (PROVENTIL HFA;VENTOLIN HFA) 108 (90 BASE) MCG/ACT inhaler Inhale 1 puff into the lungs every 4 (four) hours as needed for wheezing or shortness of breath.   ALPRAZolam (XANAX) 1 MG tablet Take 1 tablet (1 mg total) by mouth 3 (three) times daily as needed for anxiety.  amoxicillin (AMOXIL) 500 MG capsule Take 4 capsules (2,000 mg total) by mouth once. For dental procedure Patient not taking: Reported on 12/05/2014  apixaban (ELIQUIS) 5 MG TABS tablet Take 1 tablet (5 mg total) by mouth 2  (two) times daily.  beclomethasone (QVAR) 80 MCG/ACT inhaler Inhale 1 puff into the lungs 2 (two) times daily. 9am and 8pm  cetirizine (ZYRTEC) 10 MG tablet Take 10 mg by mouth daily.   docusate sodium (COLACE) 100 MG capsule Take 100 mg by mouth 2 (two) times daily as needed for mild constipation.  furosemide (LASIX) 40 MG tablet Take 1 tablet (40 mg total) by mouth daily. Patient taking differently: Take 40 mg by mouth daily. Patient was told that if she has increased swelling to take 2 tablets  gabapentin (NEURONTIN) 300 MG capsule TAKE TWO CAPSULES BY MOUTH THREE TIMES DAILY  levonorgestrel (MIRENA) 20 MCG/24HR IUD 1 each by Intrauterine route once.  levothyroxine (SYNTHROID, LEVOTHROID) 50 MCG tablet TAKE ONE TABLET BY MOUTH ONCE DAILY BEFORE BREAKFAST  magnesium gluconate (MAGONATE) 500 MG tablet Take 250 mg by mouth 2 (two) times daily.  metoprolol succinate (TOPROL-XL) 50 MG 24 hr tablet Take 50 mg by mouth 2 (two) times daily.   mexiletine (MEXITIL) 150 MG capsule Take 2 capsules (300 mg total) by mouth 3 (three) times daily.  Olopatadine HCl (PATANASE) 0.6 % SOLN Place 1 puff into the nose 2 (two) times daily.  oxyCODONE-acetaminophen (PERCOCET) 7.5-325 MG per tablet Take 1 tablet by mouth every 4 (four) hours as needed.   Potassium Gluconate 595 MG CAPS Take 1 capsule (595 mg total) by mouth daily. OK to take extra on days you need extra lasix  ranolazine (RANEXA) 1000 MG SR tablet Take 1 tablet (1,000 mg total) by mouth 2 (two) times daily.  sertraline (ZOLOFT) 50 MG tablet TAKE ONE TABLET BY MOUTH ONCE DAILY     History   Social History  . Marital Status: Single    Spouse Name: N/A  . Number of Children: 1  . Years of Education: N/A   Occupational History  . Naval architect    Social History Main Topics  . Smoking status: Former Smoker -- 1.00 packs/day for 3 years    Types: Cigarettes    Quit date: 09/30/1988  . Smokeless tobacco: Never Used     Comment: quitin  1990  . Alcohol Use: No     Comment: none since "months" as of 02/28/2013  . Drug Use: No     Comment: quiti n 1989  . Sexual Activity: Yes    Birth Control/ Protection: Pill   Other Topics Concern  . Not on file   Social History Narrative   Caffeine: 1 cup coffee/day   Divorced, 1 child.  lives with boyfriend, dogs   Occupation: Full time Tax adviser   Activity: walks 28min/day   Diet: fruits/vegetables daily, water, no fish    Family Status  Relation Status Death Age  . Father Deceased     33- diabetes and some unspecified heart problems  . Mother Alive     60-thyroid disease    Family History  Problem Relation Age of Onset  . Diabetes Father   . Heart disease Father     unspecified heart problem  . Thyroid disease Mother   . Anemia Mother     mediterranean anemia, ITP  . Heart disease Maternal Grandfather     CHF  . Hyperlipidemia Maternal Grandfather   . Diabetes Paternal Grandfather   . Cancer Paternal Grandfather   . Coronary artery disease Neg Hx   . Stroke Father   . Sudden death Neg Hx   . Heart attack Neg Hx      ROS: No fevers, no blood in the diarrhea.  Full 14 point review of systems complete and found to be negative unless listed above.  Physical Exam: Blood pressure 108/63, pulse 93, temperature 98.3 F (36.8 C), temperature source Oral, resp. rate 19, height 5' (1.524 m), weight 230 lb (104.327 kg), SpO2 95 %.  General: Well developed, well nourished, female in no acute distress Head: Eyes PERRLA, No xanthomas.   Normocephalic and atraumatic, oropharynx without edema or exudate. Dentition: poor Lungs: few rales bases, good air exchange Heart: HRRR S1 S2, no rub/gallop, no murmur. pulses are 2+ all 4 extrem.   Neck: No carotid bruits. No lymphadenopathy.  JVD not elevated. Abdomen: Bowel sounds present, abdomen  soft and non-tender without masses or hernias noted. Msk:  No spine or cva tenderness. No weakness, no joint deformities or  effusions. Extremities: No clubbing or cyanosis. no edema.  Neuro: Alert and oriented X 3. No focal deficits noted. Psych:  Good affect, responds appropriately Skin: No rashes or lesions noted.  Labs:   Lab Results  Component Value Date   WBC 6.0 12/12/2014   HGB 14.4 12/12/2014   HCT 43.2 12/12/2014   MCV 92.5 12/12/2014   PLT 268 12/12/2014   No results for input(s): INR in the last 72 hours. No results for input(s): NA, K, CL, CO2, BUN, CREATININE, CALCIUM, PROT, BILITOT, ALKPHOS, ALT, AST, GLUCOSE, ALBUMIN in the last 168 hours.  Invalid input(s): LABALBU MAGNESIUM  Date Value Ref Range Status  08/31/2014 2.1 1.5 - 2.5 mg/dL Final    Recent Labs  16/10/96 1501  TROPIPOC 0.03   B NATRIURETIC PEPTIDE  Date/Time Value Ref Range Status  11/16/2014 11:30 AM 186.1* 0.0 - 100.0 pg/mL Final  10/11/2014 03:16 PM 130.6* 0.0 - 100.0 pg/mL Final    Comment:    Please note change in reference range.   TSH  Date/Time Value Ref Range Status  08/31/2014 12:04 PM 3.211 0.350 - 4.500 uIU/mL Final    Echo: (11/2013):  HOCM with prior septal myomectomy, moderate LVH, EF 40-45%, trivial AI, mitral valve s/p repair with a small diastolic gradient, no MS (mean 9 mmHg, peak 16 mmHg), trivial MR, moderate LAE  ECG: SR, LBBB is old  Radiology:  No results found. Pending  ASSESSMENT AND PLAN:   The patient was seen today by Dr. Royann Shivers, the patient evaluated and the data reviewed.  Principal Problem:   Syncope - Likely 2nd atrial flutter 1:1 conduction - several possible contributing factors  - hypotension with/without dehydration, electrolyte abnormalities  - Lasix and potassium are on hold  Active Problems:   Atrial flutter - continue Mexilitene - EP consult, may need to add amiodarone    Chronic combined systolic and diastolic CHF (congestive heart failure) - volume status looks good, will have to follow closely, especially if she needs hydration. - Hold Lasix for  now    Signed: Theodore Demark, PA-C 12/12/2014 3:44 PM Beeper [phone]  Co-Sign MD I have seen and examined the patient along with Theodore Demark, PA-C.  I have reviewed the chart, notes and new data.  I agree with PA's note.  Key new complaints: syncope today Key examination changes: no overt CHF, regular rhythm Key new findings / data: ICD interrogation shows recurrent episodes of atrial flutter, initially with 2:1 AV conduction, deteriorating to 1:1 conduction and then double tachycardia (Atrial CL 330 ms, Ventricular CL 295 ms) with V rate under detection zone of 240 bpm.  Currently in A flutter with 2:1 conduction, 95 bpm  Diagnosis:  - symptomatic VT with syncope, triggered by AFlutter - normal ICD function - therapy not delivered due to VT programming with high detection rate in an effort to avoid inappropriate shocks. - HOCM s/p myomectomy and previous VT ablation  PLAN: Admission. Correct electrolytes if abnormal. EP consultation - she has previously been intolerant of PO  Amiodarone, but tolerates IV amio. Unable to increase conventional AV blocking agents due to hypotension. Consider another attempt at AFlutter ablation versus AV node ablation (and possible CRT-D upgrade)versus another attempt at VT ablation. Will hold off IV amiodarone and treat with IV metoprolol for now, in case EPS/RFA is an option.  Thurmon Fair, MD, [facility] [phone] 12/12/2014, 4:17 PM

## 2014-12-12 NOTE — ED Notes (Signed)
Pt BP 85/60 at this time, rechecked. Per MD Belfi, hold metoprolol. 500 cc bolus started.

## 2014-12-12 NOTE — Telephone Encounter (Signed)
New message      Pt had racing heart yesterday and low bp.   Her HR was 100 and bp was 95/60.  Should she have taken a metoprolol?

## 2014-12-12 NOTE — ED Notes (Signed)
Per ems, called to residence for patient with nausea, vomiting, weakness.  Pt w/hx of cardiomyopathy, aicd/pacer.  Pt noted to have BBB and reciprical changes on EKG for EMS, activated as Code [facility]emi PTA.  Pt arrives awake, alert, oriented, c/o nausea and vomiting, denies chest pain

## 2014-12-12 NOTE — ED Provider Notes (Signed)
CSN: [id number]     Arrival date & time 12/12/14  1439 History   First MD Initiated Contact with Patient 12/12/14 1509     Chief Complaint  Patient presents with  . Code STEMI     (Consider location/radiation/quality/duration/timing/severity/associated sxs/prior Treatment) HPI Comments: Patient with a history of hypertrophic obstructive cardiomyopathy and V. tach presents with a syncopal episode. She today started getting diaphoretic and while she was on the phone with her husband passed out. She was out for about 5-7 minutes per husband. When she woke up she was incontinent of stool and had some episodes of vomiting. She denies any chest pain or shortness of breath. There is no preceding palpitations. She currently is feeling a little bit better but just feels fatigued. She's had no recent illnesses other than some loose stools for the last few days. She's been taking her medications as directed. She has a history of a defibrillator/pacemaker placement in 2013. She was initially brought in as a code STEMI but cardiology reviewed her EKGs and canceled the code STEMI as they look similar to her prior EKGs.   Past Medical History  Diagnosis Date  . Hypertrophic cardiomyopathy     s/p septal myomectomy with implantable defibrillator 01/27/2012   . Anxiety state, unspecified   . Allergic rhinitis     to dogs  . Asthma   . Anemia     during pregnancy  . History of chicken pox   . Generalized headaches   . Ocular migraine     no HA  . Back pain     told cracked bone, vicodin didn't help, improved with percocets/muscle relaxants, no eval by prior PCP  . 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 DVT (deep venous thrombosis) 01/2012    was placed on Warfarin after cardiac surgery but had skin reaction to warfarin so was changed to Pradaxa so DVT occurred while on Pradaxa although unclear if it occurred during transition  . Hypotension   .  Atrial fibrillation     a. On pradaxa.  coumadin necrosis with warfarin;  b. Amio d/c'd 02/2012  . Pericardial effusion   . Chronic combined systolic and diastolic CHF (congestive heart failure)     a. echo 03/19/12: severe asymmetric septal hypertrophy, evidence of upper septal myomectomy, peak LV outflow tract gradient 35, EF 35%,;  b.  Echo 9/13: Severe LVH, EF 30-35%, anteroseptal and inferior HK, LVOT narrow, mild AI, mild to moderate mitral stenosis, severe LAE  . Pericardial tamponade 03/14/2012  . 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 01/2012 s/p BSX ICD;  b. 06/2012 Multaq initiated due to recurrent syncope, VT/VF - had severe n/v with IV amio.  . Skin necrosis --COUMADIN   . Chronic chest wall pain     eval by CVTS, stable, [facility]  . Syncope     a. in setting of VT/VF.  Marland Kitchen. Hypothyroid   . Blood transfusion without reported diagnosis   . Neuromuscular disorder   . Gall stones   . Atrial flutter 10/2013    with inappropriate ICD therapy; s/p CTI ablation by Dr Ladona Ridgelaylor 11/16/2013  . PONV (postoperative nausea and vomiting)   . Depression   . Post-traumatic stress   . Fibromyalgia   . Hx of echocardiogram     Echo (2/16): mod LVH, EF 55-60%, no RWMA, Gr 2 DD, mild AI, mild to mod  MS, mod LAE   Past Surgical History  Procedure Laterality Date  . Cesarean section  1991  . Induced abortion  1990 and 1993  . Cardiac surgery  01/27/2012    median sternotomy, septal myectomy, MVR - [facility] (Dr. Silvestre Mesi)  . Cardiac defibrillator placement  01/2012    BSX dual chamber ICD  . Myomectomy    . Pericardial window  03/14/2012    Procedure: PERICARDIAL WINDOW;  Surgeon: Purcell Nails, MD;  Location: [facility] OR;  Service: Open Heart Surgery;  Laterality: N/A;  Subxyphoid Pericardial  Window   . Ablation  11-16-13    RFCA of atrial flutter by Dr Ladona Ridgel   . Insert / replace / remove pacemaker    . Mitral valve repair    . Cholecystectomy N/A 01/26/2014    LAPAROSCOPIC  CHOLECYSTECTOMY WITH INTRAOPERATIVE CHOLANGIOGRAM;  Surgeon: Wilmon Arms. Tsuei, MD  . Atrial flutter ablation N/A 11/16/2013    Procedure: ATRIAL FLUTTER ABLATION;  Surgeon: Marinus Maw, MD;  Location: [facility] CATH LAB;  Service: Cardiovascular;  Laterality: N/A;   Family History  Problem Relation Age of Onset  . Diabetes Father   . Heart disease Father     unspecified heart problem  . Thyroid disease Mother   . Anemia Mother     mediterranean anemia, ITP  . Heart disease Maternal Grandfather     CHF  . Hyperlipidemia Maternal Grandfather   . Diabetes Paternal Grandfather   . Cancer Paternal Grandfather   . Coronary artery disease Neg Hx   . Stroke Father   . Sudden death Neg Hx   . Heart attack Neg Hx    History  Substance Use Topics  . Smoking status: Former Smoker -- 1.00 packs/day for 3 years    Types: Cigarettes    Quit date: 09/30/1988  . Smokeless tobacco: Never Used     Comment: quitin 1990  . Alcohol Use: No     Comment: none since "months" as of 02/28/2013   OB History    No data available     Review of Systems  Constitutional: Positive for diaphoresis. Negative for fever, chills and fatigue.  HENT: Negative for congestion, rhinorrhea and sneezing.   Eyes: Negative.   Respiratory: Negative for cough, chest tightness and shortness of breath.   Cardiovascular: Negative for chest pain and leg swelling.  Gastrointestinal: Positive for nausea and vomiting. Negative for abdominal pain, diarrhea and blood in stool.  Genitourinary: Negative for frequency, hematuria, flank pain and difficulty urinating.  Musculoskeletal: Negative for back pain and arthralgias.  Skin: Negative for rash.  Neurological: Positive for syncope. Negative for dizziness, speech difficulty, weakness, numbness and headaches.      Allergies  Amiodarone; Phenergan; Warfarin and related; and Nitroglycerin  Home Medications   Prior to Admission medications   Medication Sig Start Date End Date  Taking? Authorizing Provider  acetaminophen (TYLENOL) 500 MG tablet Take 500-1,000 mg by mouth 2 (two) times daily as needed for mild pain, moderate pain, fever or headache (pain).    Yes Historical Provider, MD  albuterol (PROVENTIL HFA;VENTOLIN HFA) 108 (90 BASE) MCG/ACT inhaler Inhale 1 puff into the lungs every 4 (four) hours as needed for wheezing or shortness of breath.    Yes Historical Provider, MD  ALPRAZolam Prudy Feeler) 1 MG tablet Take 1 tablet (1 mg total) by mouth 3 (three) times daily as needed for anxiety. Patient taking differently: Take 1 mg by mouth 2 (two) times daily.  10/27/14  Yes Wynona Canes  Sharen Hones, MD  amoxicillin (AMOXIL) 500 MG capsule Take 4 capsules (2,000 mg total) by mouth once. For dental procedure 10/27/14  Yes Eustaquio Boyden, MD  apixaban (ELIQUIS) 5 MG TABS tablet Take 1 tablet (5 mg total) by mouth 2 (two) times daily. 11/03/14  Yes Beatrice Lecher, PA-C  aspirin EC 81 MG tablet Take 324 mg by mouth once.    Yes Historical Provider, MD  beclomethasone (QVAR) 80 MCG/ACT inhaler Inhale 1 puff into the lungs 2 (two) times daily. 9am and 8pm   Yes Historical Provider, MD  cetirizine (ZYRTEC) 10 MG tablet Take 10 mg by mouth daily.    Yes Historical Provider, MD  docusate sodium (COLACE) 100 MG capsule Take 100 mg by mouth 2 (two) times daily as needed for mild constipation.   Yes Historical Provider, MD  furosemide (LASIX) 40 MG tablet Take 1 tablet (40 mg total) by mouth daily. Patient taking differently: Take 40 mg by mouth daily. Patient was told that if she has increased swelling to take 2 tablets 11/03/14  Yes Scott T Weaver, PA-C  gabapentin (NEURONTIN) 300 MG capsule TAKE TWO CAPSULES BY MOUTH THREE TIMES DAILY 12/09/14  Yes Drema Dallas, DO  levonorgestrel (MIRENA) 20 MCG/24HR IUD 1 each by Intrauterine route once.   Yes Historical Provider, MD  levothyroxine (SYNTHROID, LEVOTHROID) 50 MCG tablet TAKE ONE TABLET BY MOUTH ONCE DAILY BEFORE BREAKFAST 12/09/14  Yes Eustaquio Boyden, MD  magnesium gluconate (MAGONATE) 500 MG tablet Take 250 mg by mouth 2 (two) times daily.    Yes Historical Provider, MD  metoprolol succinate (TOPROL-XL) 50 MG 24 hr tablet Take 50 mg by mouth 2 (two) times daily.  10/14/14  Yes Historical Provider, MD  mexiletine (MEXITIL) 150 MG capsule Take 2 capsules (300 mg total) by mouth 3 (three) times daily. 11/03/14  Yes Beatrice Lecher, PA-C  Olopatadine HCl (PATANASE) 0.6 % SOLN Place 1 puff into the nose as needed (for allergies).    Yes Historical Provider, MD  oxyCODONE-acetaminophen (PERCOCET) 7.5-325 MG per tablet Take 1 tablet by mouth every 4 (four) hours as needed.  10/13/14  Yes Historical Provider, MD  Potassium Gluconate 595 MG CAPS Take 1 capsule (595 mg total) by mouth daily. OK to take extra on days you need extra lasix 11/03/14  Yes Scott T Alben Spittle, PA-C  ranolazine (RANEXA) 1000 MG SR tablet Take 1 tablet (1,000 mg total) by mouth 2 (two) times daily. 11/03/14 11/03/15 Yes Scott T Alben Spittle, PA-C  sertraline (ZOLOFT) 50 MG tablet TAKE ONE TABLET BY MOUTH ONCE DAILY 12/09/14  Yes Eustaquio Boyden, MD   BP 81/62 mmHg  Pulse 97  Temp(Src) 98.3 F (36.8 C) (Oral)  Resp 18  Ht 5' (1.524 m)  Wt 230 lb (104.327 kg)  BMI 44.92 kg/m2  SpO2 97% Physical Exam  Constitutional: She is oriented to person, place, and time. She appears well-developed and well-nourished.  HENT:  Head: Normocephalic and atraumatic.  Eyes: Pupils are equal, round, and reactive to light.  Neck: Normal range of motion. Neck supple.  Cardiovascular: Normal rate, regular rhythm and normal heart sounds.   Pulmonary/Chest: Effort normal and breath sounds normal. No respiratory distress. She has no wheezes. She has no rales. She exhibits no tenderness.  Abdominal: Soft. Bowel sounds are normal. There is no tenderness. There is no rebound and no guarding.  Musculoskeletal: Normal range of motion. She exhibits no edema.  Lymphadenopathy:    She has no cervical adenopathy.   Neurological:  She is alert and oriented to person, place, and time.  Skin: Skin is warm and dry. No rash noted.  Psychiatric: She has a normal mood and affect.    ED Course  Procedures (including critical care time) Labs Review Labs Reviewed  COMPREHENSIVE METABOLIC PANEL - Abnormal; Notable for the following:    Glucose, Bld 140 (*)    AST 45 (*)    ALT 45 (*)    GFR calc non Af Amer 75 (*)    GFR calc Af Amer 87 (*)    All other components within normal limits  APTT  CBC  TSH  I-STAT TROPOININ, ED    Imaging Review Dg Chest Port 1 View  12/12/2014   CLINICAL DATA:  Nausea, vomiting and weakness.  EXAM: PORTABLE CHEST - 1 VIEW  COMPARISON:  PA and lateral chest 11/16/2014.  FINDINGS: The patient is status post median sternotomy with an AICD in place, unchanged. There is cardiomegaly but no edema. Lungs are clear. No pneumothorax or pleural effusion.  IMPRESSION: Cardiomegaly without acute disease.   Electronically Signed   By: Drusilla Kanner M.D.   On: 12/12/2014 15:47     EKG Interpretation   Date/Time:  Monday December 12 2014 14:47:24 EDT Ventricular Rate:  94 PR Interval:    QRS Duration: 180 QT Interval:  447 QTC Calculation: 559 R Axis:   153 Text Interpretation:  probable sinus rhythm Left bundle branch block noted  on old EKG no pacer spikes seen Confirmed by Kobyn Kray  MD, Champion Corales ([id number]) on  12/12/2014 3:21:02 PM      MDM   Final diagnoses:  Ventricular tachycardia    Patient's pacemaker was interrogated by cardiology and they found the patient had runs of V. tach that was not appropriately defibrillated by her defibrillator. She will be admitted by the cardiology service.    Rolan Bucco, MD 12/12/14 1700

## 2014-12-13 ENCOUNTER — Encounter (HOSPITAL_COMMUNITY)
Admission: EM | Disposition: A | Discharge status: Home / Self Care | Payer: Self-pay | Source: Home / Self Care | Attending: Cardiovascular Disease

## 2014-12-13 ENCOUNTER — Encounter (HOSPITAL_COMMUNITY): Payer: Self-pay | Admitting: *Deleted

## 2014-12-13 ENCOUNTER — Inpatient Hospital Stay (HOSPITAL_COMMUNITY): Payer: BLUE CROSS/BLUE SHIELD | Admitting: Anesthesiology

## 2014-12-13 DIAGNOSIS — I4891 Unspecified atrial fibrillation: Secondary | ICD-10-CM

## 2014-12-13 DIAGNOSIS — I429 Cardiomyopathy, unspecified: Secondary | ICD-10-CM

## 2014-12-13 HISTORY — PX: CARDIOVERSION: SHX1299

## 2014-12-13 LAB — BASIC METABOLIC PANEL
Anion gap: 6 (ref 5–15)
BUN: 10 mg/dL (ref 6–23)
CALCIUM: 8.3 mg/dL — AB (ref 8.4–10.5)
CHLORIDE: 108 mmol/L (ref 96–112)
CO2: 26 mmol/L (ref 19–32)
Creatinine, Ser: 0.84 mg/dL (ref 0.50–1.10)
GFR calc Af Amer: >90 mL/min (ref >90)
GFR calc non Af Amer: 83 mL/min — ABNORMAL LOW (ref >90)
GLUCOSE: 106 mg/dL — AB (ref 70–99)
POTASSIUM: 4.5 mmol/L (ref 3.5–5.1)
Sodium: 140 mmol/L (ref 135–145)

## 2014-12-13 LAB — TROPONIN I
TROPONIN I: 0.07 ng/mL — AB (ref <0.031)
Troponin I: 0.08 ng/mL — ABNORMAL HIGH (ref <0.031)

## 2014-12-13 SURGERY — CARDIOVERSION
Anesthesia: Monitor Anesthesia Care

## 2014-12-13 MED ORDER — PROPOFOL 10 MG/ML IV BOLUS
INTRAVENOUS | Status: DC | PRN
  Administered 2014-12-13: 70 mg via INTRAVENOUS

## 2014-12-13 MED ORDER — SODIUM CHLORIDE 0.9 % IJ SOLN: 3.0000 mL | INTRAMUSCULAR | Status: DC | PRN

## 2014-12-13 MED ORDER — MEXILETINE HCL 150 MG PO CAPS
300.0000 mg | ORAL_CAPSULE | Freq: Four times a day (QID) | ORAL | Status: DC
  Administered 2014-12-13 (×2): 300 mg via ORAL
  Filled 2014-12-13 (×6): qty 2

## 2014-12-13 MED ORDER — SODIUM CHLORIDE 0.9 % IV SOLN
INTRAVENOUS | Status: DC
  Administered 2014-12-13: 13:00:00 via INTRAVENOUS

## 2014-12-13 MED ORDER — LIDOCAINE HCL (CARDIAC) 20 MG/ML IV SOLN
INTRAVENOUS | Status: DC | PRN
  Administered 2014-12-13: 40 mg via INTRAVENOUS

## 2014-12-13 MED ORDER — SODIUM CHLORIDE 0.9 % IJ SOLN
3.0000 mL | Freq: Two times a day (BID) | INTRAMUSCULAR | Status: DC
  Administered 2014-12-13 – 2014-12-17 (×6): 3 mL via INTRAVENOUS

## 2014-12-13 MED ORDER — METOPROLOL SUCCINATE ER 50 MG PO TB24
50.0000 mg | ORAL_TABLET | Freq: Two times a day (BID) | ORAL | Status: DC
  Administered 2014-12-13 (×2): 50 mg via ORAL
  Filled 2014-12-13 (×5): qty 1

## 2014-12-13 MED ORDER — MEXILETINE HCL 150 MG PO CAPS
300.0000 mg | ORAL_CAPSULE | Freq: Four times a day (QID) | ORAL | Status: DC
  Administered 2014-12-13 – 2014-12-14 (×2): 300 mg via ORAL
  Filled 2014-12-13 (×6): qty 2

## 2014-12-13 MED ORDER — SODIUM CHLORIDE 0.9 % IV SOLN: 250.0000 mL | INTRAVENOUS | Status: DC

## 2014-12-13 MED ORDER — MEXILETINE HCL 150 MG PO CAPS
300.0000 mg | ORAL_CAPSULE | Freq: Three times a day (TID) | ORAL | Status: DC
  Administered 2014-12-13: 300 mg via ORAL
  Filled 2014-12-13 (×5): qty 2

## 2014-12-13 NOTE — Telephone Encounter (Signed)
Called patient this morning again as I was unable to reach her yesterday.  She states she is in the hospital.  She went yesterday afternoon.  I explained to her that I had called yesterday but was going to discuss with Dr Ladona Ridgelaylor today as she was asymptomatic at the time she called.   Was feeling fine yesterday morning, went to the pain clinic, grocery store, came home fed dogs and was walking back into the living room and felt " hot headed".  She says that is usually her sign for VT.  So she went to the ER.

## 2014-12-13 NOTE — Progress Notes (Signed)
Dr. Mayford Knifeurner paged regarding frequent runs of V-tach, received orders for labs. Will continue to monitor.

## 2014-12-13 NOTE — Progress Notes (Signed)
Mexitil scheduled 1000,1400,1800,2200 after pt already had 0600 dose.  Clarified with Dr. Ladona Ridgelaylor that pt is only to get 4 doses today in which I notified pharmacy who now will schedule 4th and last dose of day at 2000.  Also notified Dr. Ladona Ridgelaylor of high TSH.  States, "will let her f/u with her endocrinoligist after dc.

## 2014-12-13 NOTE — Progress Notes (Signed)
Pt back on unit at this time.  States, "feel better, but weak."  Pt's cardiac rhythm is now A-Paced in 70's.  No c/o pain or s/s of any acute distress noted.

## 2014-12-13 NOTE — H&P (View-Only) (Signed)
Patient ID: [name], female   DOB: 04/29/1971, 44 y.o.   MRN: [id number]    Patient Name: [name] Date of Encounter: 12/13/2014     Principal Problem:   Syncope Active Problems:   Chronic combined systolic and diastolic CHF (congestive heart failure)   Atrial flutter with rapid ventricular response   Atrial fibrillation with RVR    SUBJECTIVE  Nausea and vomiting are better.   CURRENT MEDS . acidophilus  1 capsule Oral Daily  . ALPRAZolam  1 mg Oral BID  . apixaban  5 mg Oral BID  . aspirin EC  81 mg Oral Daily  . azelastine  1 spray Each Nare BID  . fluticasone  1 puff Inhalation BID  . gabapentin  600 mg Oral TID  . levothyroxine  50 mcg Oral QAC breakfast  . loratadine  10 mg Oral Daily  . magnesium gluconate  250 mg Oral Daily  . metoprolol  2.5 mg Intravenous Q4H  . mexiletine  300 mg Oral QID  . ranolazine  1,000 mg Oral BID  . sertraline  50 mg Oral Daily  . sodium chloride  3 mL Intravenous Q12H    OBJECTIVE  Filed Vitals:   12/13/14 0000 12/13/14 0125 12/13/14 0318 12/13/14 0720  BP: 92/53 102/57 95/60 121/79  Pulse: 97 100 104 102  Temp:   98.2 F (36.8 C)   TempSrc:   Oral   Resp: Height:      Weight:   233 lb (105.688 kg)   SpO2: 90% 93% 99% 97%    Intake/Output Summary (Last 24 hours) at 12/13/14 0810 Last data filed at 12/13/14 0700  Gross per 24 hour  Intake    905 ml  Output    275 ml  Net    630 ml   Filed Weights   12/12/14 1453 12/12/14 2040 12/13/14 0318  Weight: 230 lb (104.327 kg) 231 lb 0.7 oz (104.8 kg) 233 lb (105.688 kg)    PHYSICAL EXAM  General: Pleasant, NAD. Neuro: Alert and oriented X 3. Moves all extremities spontaneously. Psych: Normal affect. HEENT:  Normal  Neck: Supple without bruits or JVD. Lungs:  Resp regular and unlabored, CTA. Heart: RRR no s3, s4, or murmurs. Abdomen: Soft, non-tender, non-distended, BS + x 4.  Extremities: No clubbing, cyanosis or edema. DP/PT/Radials 2+ and  equal bilaterally.  Accessory Clinical Findings  CBC  Recent Labs  12/12/14 1455  WBC 6.0  HGB 14.4  HCT 43.2  MCV 92.5  PLT 268   Basic Metabolic Panel  Recent Labs  12/12/14 1455 12/12/14 2045 12/13/14 0220  NA 137  --  140  K 4.0  --  4.5  CL 104  --  108  CO2 23  --  26  GLUCOSE 140*  --  106*  BUN 11  --  10  CREATININE 0.92  --  0.84  CALCIUM 8.9  --  8.3*  MG  --  2.2  --    Liver Function Tests  Recent Labs  12/12/14 1455  AST 45*  ALT 45*  ALKPHOS 52  BILITOT 0.7  PROT 7.5  ALBUMIN 3.7   No results for input(s): LIPASE, AMYLASE in the last 72 hours. Cardiac Enzymes  Recent Labs  12/12/14 2045 12/13/14 0220  TROPONINI 0.09* 0.08*   BNP Invalid input(s): POCBNP D-Dimer No results for input(s): DDIMER in the last 72 hours. Hemoglobin A1C No results for input(s): HGBA1C in the  last 72 hours. Fasting Lipid Panel No results for input(s): CHOL, HDL, LDLCALC, TRIG, CHOLHDL, LDLDIRECT in the last 72 hours. Thyroid Function Tests  Recent Labs  12/12/14 1552  TSH 6.312*    TELE  Probably 2:1 atrial flutter  Radiology/Studies  Dg Chest 2 View  11/16/2014   CLINICAL DATA:  Pain and difficulty breathing  EXAM: CHEST  2 VIEW  COMPARISON:  October 11, 2014  FINDINGS: There is no edema or consolidation. Heart is borderline enlarged with pulmonary vascularity within normal limits. Pacemaker leads are unchanged. In addition to these which appear attached to the right atrium and right ventricle, there is a which may be extending into the azygos region. No adenopathy. No bone lesions.  IMPRESSION: Stable positioning of pacemaker leads. Heart borderline increased in size. Lungs clear.   Electronically Signed   By: Bretta BangWilliam  Woodruff III M.D.   On: 11/16/2014 12:01   Dg Chest Port 1 View  12/12/2014   CLINICAL DATA:  Nausea, vomiting and weakness.  EXAM: PORTABLE CHEST - 1 VIEW  COMPARISON:  PA and lateral chest 11/16/2014.  FINDINGS: The patient is  status post median sternotomy with an AICD in place, unchanged. There is cardiomegaly but no edema. Lungs are clear. No pneumothorax or pleural effusion.  IMPRESSION: Cardiomegaly without acute disease.   Electronically Signed   By: Drusilla Kannerhomas  Dalessio M.D.   On: 12/12/2014 15:47    ASSESSMENT AND PLAN  1. Atrial flutter  2. Ventricular tachycardia 3. Non-ischemic CM 4. Chronic class 2 systolic heart failure 5. Morbid obesity Rec: will attempt to uptitrate mexilitine to max dose. Will switch metoprolol to po and re-interogate her device. If she is in atrial flutter will plan to attempt to pace terminate.  Iram Astorino,M.D.  12/13/2014 8:10 AM   EP Addendum  Patient is in fact in atrial flutter/tachycardia. I have attempted to pace her back to NSR. (She has been in flutter for less than 48 hours and has been on Xarelto for months and is compliant). She went into a faster flutter. If she converts to NSR great but I suspect she will require DCCV. Will attempt to schedule today. She is NPO.  Leonia ReevesGregg Britton Bera,M.D.

## 2014-12-13 NOTE — CV Procedure (Signed)
DCC: Anesthesia propofol 70 mg  Flutter with V pacing confirmed by device with [employer] DCC x 1 120 J bipasic  NSR rate 80  No immediate neurologic sequelae On Rx NOAC  [employer]ions Financial CorporationPeter Loisann Roach

## 2014-12-13 NOTE — Interval H&P Note (Signed)
History and Physical Interval Note:  12/13/2014 12:40 PM  [name]  has presented today for surgery, with the diagnosis of a fib  The various methods of treatment have been discussed with the patient and family. After consideration of risks, benefits and other options for treatment, the patient has consented to  Procedure(s): CARDIOVERSION (N/A) as a surgical intervention .  The patient's history has been reviewed, patient examined, no change in status, stable for surgery.  I have reviewed the patient's chart and labs.  Questions were answered to the patient's satisfaction.     Charlton HawsPeter Amery Vandenbos

## 2014-12-13 NOTE — Transfer of Care (Signed)
Immediate Anesthesia Transfer of Care Note  Patient: [name]  Procedure(s) Performed: Procedure(s): CARDIOVERSION (N/A)  Patient Location: Endoscopy Unit  Anesthesia Type:MAC and General  Level of Consciousness: awake, alert , oriented and patient cooperative  Airway & Oxygen Therapy: Patient Spontanous Breathing and Patient connected to nasal cannula oxygen  Post-op Assessment: Report given to RN, Post -op Vital signs reviewed and stable, Patient moving all extremities and Patient moving all extremities X 4  Post vital signs: Reviewed  Last Vitals:  Filed Vitals:   12/13/14 1225  BP: 129/88  Pulse: 110  Temp: 36.4 C  Resp: 18    Complications: No apparent anesthesia complications

## 2014-12-13 NOTE — Care Management Note (Signed)
    Page 1 of 1   12/13/2014     7:43:31 AM CARE MANAGEMENT NOTE 12/13/2014  Patient:  [name],[name]   Account Number:  [id number]  Date Initiated:  12/13/2014  Documentation initiated by:  Junius CreamerWELL,DEBBIE  Subjective/Objective Assessment:   adm w syncope, hypotension     Action/Plan:   lives at home, pcp dr Ruthine Dosejavier guttierrez   Anticipated DC Date:     Anticipated DC Plan:  HOME/SELF CARE         Choice offered to / List presented to:             Status of service:   Medicare Important Message given?   (If response is "NO", the following Medicare IM given date fields will be blank) Date Medicare IM given:   Medicare IM given by:   Date Additional Medicare IM given:   Additional Medicare IM given by:    Discharge Disposition:    Per UR Regulation:  Reviewed for med. necessity/level of care/duration of stay  If discussed at Long Length of Stay Meetings, dates discussed:    Comments:

## 2014-12-13 NOTE — Anesthesia Preprocedure Evaluation (Signed)
Anesthesia Evaluation  Patient identified by MRN, date of birth, ID band Patient awake    Reviewed: Allergy & Precautions, NPO status , Patient's Chart, lab work & pertinent test results  History of Anesthesia Complications (+) PONV  Airway        Dental   Pulmonary asthma , former smoker,          Cardiovascular + Cardiac Defibrillator  ECHO 11/25/2014 EF 60%, has HX of hypertrophic cardiomyopathy   Neuro/Psych Anxiety Depression    GI/Hepatic negative GI ROS, Neg liver ROS,   Endo/Other    Renal/GU negative Renal ROS     Musculoskeletal   Abdominal   Peds  Hematology   Anesthesia Other Findings   Reproductive/Obstetrics                             Anesthesia Physical Anesthesia Plan  ASA: III  Anesthesia Plan: MAC   Post-op Pain Management:    Induction: Intravenous  Airway Management Planned: Nasal Cannula  Additional Equipment:   Intra-op Plan:   Post-operative Plan:   Informed Consent: I have reviewed the patients History and Physical, chart, labs and discussed the procedure including the risks, benefits and alternatives for the proposed anesthesia with the patient or authorized representative who has indicated his/her understanding and acceptance.     Plan Discussed with:   Anesthesia Plan Comments: (Troponin elevated .07-.09)        Anesthesia Quick Evaluation

## 2014-12-13 NOTE — Progress Notes (Signed)
Pt's heart rhythm is irregular and hard to interpret off monitor per myself and telemetry tech.  EKG ordered to confirm rhythm which is A-Paced/NSR with 1st degree AVB and BBB rate in 80's at this time.  No c/o anything at this time.  No s/s of any acute distress.

## 2014-12-13 NOTE — Anesthesia Postprocedure Evaluation (Signed)
  Anesthesia Post-op Note  Patient: Crystal A Beaupre  Procedure(s) Performed: Procedure(s): CARDIOVERSION (N/A)  Patient Location: PACU and Endoscopy Unit  Anesthesia Type:General  Level of Consciousness: awake  Airway and Oxygen Therapy: Patient Spontanous Breathing and Patient connected to nasal cannula oxygen  Post-op Pain: none  Post-op Assessment: Post-op Vital signs reviewed and Patient's Cardiovascular Status Stable  Post-op Vital Signs: Reviewed and stable  Last Vitals:  Filed Vitals:   12/13/14 1225  BP: 129/88  Pulse: 110  Temp: 36.4 C  Resp: 18    Complications: No apparent anesthesia complications

## 2014-12-13 NOTE — Progress Notes (Addendum)
Patient ID: Nimo A Amadi, female   DOB: 06/15/1971, 44 y.o.   MRN: [id number]    Patient Name: [name] Date of Encounter: 12/13/2014     Principal Problem:   Syncope Active Problems:   Chronic combined systolic and diastolic CHF (congestive heart failure)   Atrial flutter with rapid ventricular response   Atrial fibrillation with RVR    SUBJECTIVE  Nausea and vomiting are better.   CURRENT MEDS . acidophilus  1 capsule Oral Daily  . ALPRAZolam  1 mg Oral BID  . apixaban  5 mg Oral BID  . aspirin EC  81 mg Oral Daily  . azelastine  1 spray Each Nare BID  . fluticasone  1 puff Inhalation BID  . gabapentin  600 mg Oral TID  . levothyroxine  50 mcg Oral QAC breakfast  . loratadine  10 mg Oral Daily  . magnesium gluconate  250 mg Oral Daily  . metoprolol  2.5 mg Intravenous Q4H  . mexiletine  300 mg Oral QID  . ranolazine  1,000 mg Oral BID  . sertraline  50 mg Oral Daily  . sodium chloride  3 mL Intravenous Q12H    OBJECTIVE  Filed Vitals:   12/13/14 0000 12/13/14 0125 12/13/14 0318 12/13/14 0720  BP: 92/53 102/57 95/60 121/79  Pulse: 97 100 104 102  Temp:   98.2 F (36.8 C)   TempSrc:   Oral   Resp: 16 17 21 16  Height:      Weight:   233 lb (105.688 kg)   SpO2: 90% 93% 99% 97%    Intake/Output Summary (Last 24 hours) at 12/13/14 0810 Last data filed at 12/13/14 0700  Gross per 24 hour  Intake    905 ml  Output    275 ml  Net    630 ml   Filed Weights   12/12/14 1453 12/12/14 2040 12/13/14 0318  Weight: 230 lb (104.327 kg) 231 lb 0.7 oz (104.8 kg) 233 lb (105.688 kg)    PHYSICAL EXAM  General: Pleasant, NAD. Neuro: Alert and oriented X 3. Moves all extremities spontaneously. Psych: Normal affect. HEENT:  Normal  Neck: Supple without bruits or JVD. Lungs:  Resp regular and unlabored, CTA. Heart: RRR no s3, s4, or murmurs. Abdomen: Soft, non-tender, non-distended, BS + x 4.  Extremities: No clubbing, cyanosis or edema. DP/PT/Radials 2+ and  equal bilaterally.  Accessory Clinical Findings  CBC  Recent Labs  12/12/14 1455  WBC 6.0  HGB 14.4  HCT 43.2  MCV 92.5  PLT 268   Basic Metabolic Panel  Recent Labs  12/12/14 1455 12/12/14 2045 12/13/14 0220  NA 137  --  140  K 4.0  --  4.5  CL 104  --  108  CO2 23  --  26  GLUCOSE 140*  --  106*  BUN 11  --  10  CREATININE 0.92  --  0.84  CALCIUM 8.9  --  8.3*  MG  --  2.2  --    Liver Function Tests  Recent Labs  12/12/14 1455  AST 45*  ALT 45*  ALKPHOS 52  BILITOT 0.7  PROT 7.5  ALBUMIN 3.7   No results for input(s): LIPASE, AMYLASE in the last 72 hours. Cardiac Enzymes  Recent Labs  12/12/14 2045 12/13/14 0220  TROPONINI 0.09* 0.08*   BNP Invalid input(s): POCBNP D-Dimer No results for input(s): DDIMER in the last 72 hours. Hemoglobin A1C No results for input(s): HGBA1C in the   last 72 hours. Fasting Lipid Panel No results for input(s): CHOL, HDL, LDLCALC, TRIG, CHOLHDL, LDLDIRECT in the last 72 hours. Thyroid Function Tests  Recent Labs  12/12/14 1552  TSH 6.312*    TELE  Probably 2:1 atrial flutter  Radiology/Studies  Dg Chest 2 View  11/16/2014   CLINICAL DATA:  Pain and difficulty breathing  EXAM: CHEST  2 VIEW  COMPARISON:  October 11, 2014  FINDINGS: There is no edema or consolidation. Heart is borderline enlarged with pulmonary vascularity within normal limits. Pacemaker leads are unchanged. In addition to these which appear attached to the right atrium and right ventricle, there is a which may be extending into the azygos region. No adenopathy. No bone lesions.  IMPRESSION: Stable positioning of pacemaker leads. Heart borderline increased in size. Lungs clear.   Electronically Signed   By: William  Woodruff III M.D.   On: 11/16/2014 12:01   Dg Chest Port 1 View  12/12/2014   CLINICAL DATA:  Nausea, vomiting and weakness.  EXAM: PORTABLE CHEST - 1 VIEW  COMPARISON:  PA and lateral chest 11/16/2014.  FINDINGS: The patient is  status post median sternotomy with an AICD in place, unchanged. There is cardiomegaly but no edema. Lungs are clear. No pneumothorax or pleural effusion.  IMPRESSION: Cardiomegaly without acute disease.   Electronically Signed   By: Thomas  Dalessio M.D.   On: 12/12/2014 15:47    ASSESSMENT AND PLAN  1. Atrial flutter  2. Ventricular tachycardia 3. Non-ischemic CM 4. Chronic class 2 systolic heart failure 5. Morbid obesity Rec: will attempt to uptitrate mexilitine to max dose. Will switch metoprolol to po and re-interogate her device. If she is in atrial flutter will plan to attempt to pace terminate.  Gregg Taylor,M.D.  12/13/2014 8:10 AM   EP Addendum  Patient is in fact in atrial flutter/tachycardia. I have attempted to pace her back to NSR. (She has been in flutter for less than 48 hours and has been on Xarelto for months and is compliant). She went into a faster flutter. If she converts to NSR great but I suspect she will require DCCV. Will attempt to schedule today. She is NPO.  Gregg Taylor,M.D. 

## 2014-12-13 NOTE — Progress Notes (Signed)
EKG CRITICAL VALUE     12 lead EKG performed.  Critical value Deatra JamesnotedMelody Wright, RN notified.   Alisha Bacus L, CCT 12/13/2014 3:33 PM

## 2014-12-14 ENCOUNTER — Encounter (HOSPITAL_COMMUNITY): Admission: RE | Admit: 2014-12-14 | Payer: BLUE CROSS/BLUE SHIELD | Source: Ambulatory Visit

## 2014-12-14 ENCOUNTER — Encounter (HOSPITAL_COMMUNITY): Payer: Self-pay | Admitting: Cardiovascular Disease

## 2014-12-14 DIAGNOSIS — I4891 Unspecified atrial fibrillation: Secondary | ICD-10-CM

## 2014-12-14 LAB — MAGNESIUM: MAGNESIUM: 1.9 mg/dL (ref 1.5–2.5)

## 2014-12-14 LAB — POTASSIUM: Potassium: 3.9 mmol/L (ref 3.5–5.1)

## 2014-12-14 MED ORDER — ONDANSETRON HCL 4 MG/2ML IJ SOLN
4.0000 mg | Freq: Two times a day (BID) | INTRAMUSCULAR | Status: DC
  Administered 2014-12-14 – 2014-12-15 (×3): 4 mg via INTRAVENOUS
  Filled 2014-12-14 (×3): qty 2

## 2014-12-14 MED ORDER — AMIODARONE HCL 200 MG PO TABS
200.0000 mg | ORAL_TABLET | Freq: Two times a day (BID) | ORAL | Status: DC
  Administered 2014-12-14 – 2014-12-15 (×4): 200 mg via ORAL
  Filled 2014-12-14 (×6): qty 1

## 2014-12-14 MED ORDER — METOPROLOL SUCCINATE ER 50 MG PO TB24
75.0000 mg | ORAL_TABLET | Freq: Two times a day (BID) | ORAL | Status: DC
  Administered 2014-12-14 – 2014-12-17 (×7): 75 mg via ORAL
  Filled 2014-12-14 (×9): qty 1

## 2014-12-14 MED ORDER — MEXILETINE HCL 150 MG PO CAPS
300.0000 mg | ORAL_CAPSULE | Freq: Three times a day (TID) | ORAL | Status: DC
  Administered 2014-12-14 – 2014-12-17 (×10): 300 mg via ORAL
  Filled 2014-12-14 (×12): qty 2

## 2014-12-14 NOTE — Progress Notes (Addendum)
Patient ID: Karen Mannindy A Nedd, female   DOB: 06/14/1971, 44 y.o.   MRN: [id number]    Patient Name: [name] Date of Encounter: 12/14/2014     Principal Problem:   Syncope Active Problems:   Chronic combined systolic and diastolic CHF (congestive heart failure)   Atrial flutter with rapid ventricular response   Atrial fibrillation with RVR    SUBJECTIVE  S/p DCCV reverted back to atrial fibrillation.  CURRENT MEDS . acidophilus  1 capsule Oral Daily  . ALPRAZolam  1 mg Oral BID  . apixaban  5 mg Oral BID  . aspirin EC  81 mg Oral Daily  . azelastine  1 spray Each Nare BID  . fluticasone  1 puff Inhalation BID  . gabapentin  600 mg Oral TID  . levothyroxine  50 mcg Oral QAC breakfast  . loratadine  10 mg Oral Daily  . magnesium gluconate  250 mg Oral Daily  . metoprolol succinate  75 mg Oral BID  . mexiletine  300 mg Oral QID  . ranolazine  1,000 mg Oral BID  . sertraline  50 mg Oral Daily  . sodium chloride  3 mL Intravenous Q12H  . sodium chloride  3 mL Intravenous Q12H    OBJECTIVE  Filed Vitals:   12/13/14 2000 12/13/14 2028 12/13/14 2300 12/14/14 0300  BP: 101/59  149/104 109/65  Pulse: 86  107 85  Temp:   98.3 F (36.8 C) 98.1 F (36.7 C)  TempSrc:   Oral Oral  Resp: 18  17 15   Height:      Weight:    235 lb 12.8 oz (106.958 kg)  SpO2: 96% 97% 94% 98%    Intake/Output Summary (Last 24 hours) at 12/14/14 0730 Last data filed at 12/13/14 2300  Gross per 24 hour  Intake   1270 ml  Output    500 ml  Net    770 ml   Filed Weights   12/12/14 2040 12/13/14 0318 12/14/14 0300  Weight: 231 lb 0.7 oz (104.8 kg) 233 lb (105.688 kg) 235 lb 12.8 oz (106.958 kg)    PHYSICAL EXAM  General: Pleasant, NAD. Neuro: Alert and oriented X 3. Moves all extremities spontaneously. Psych: Normal affect. HEENT:  Normal  Neck: Supple without bruits or JVD. Lungs:  Resp regular and unlabored, CTA. Heart: RRR no s3, s4, or murmurs. Abdomen: Soft, non-tender,  non-distended, BS + x 4.  Extremities: No clubbing, cyanosis or edema. DP/PT/Radials 2+ and equal bilaterally.  Accessory Clinical Findings  CBC  Recent Labs  12/12/14 1455  WBC 6.0  HGB 14.4  HCT 43.2  MCV 92.5  PLT 268   Basic Metabolic Panel  Recent Labs  12/12/14 1455 12/12/14 2045 12/13/14 0220 12/14/14 0029  NA 137  --  140  --   K 4.0  --  4.5 3.9  CL 104  --  108  --   CO2 23  --  26  --   GLUCOSE 140*  --  106*  --   BUN 11  --  10  --   CREATININE 0.92  --  0.84  --   CALCIUM 8.9  --  8.3*  --   MG  --  2.2  --  1.9   Liver Function Tests  Recent Labs  12/12/14 1455  AST 45*  ALT 45*  ALKPHOS 52  BILITOT 0.7  PROT 7.5  ALBUMIN 3.7   No results for input(s): LIPASE, AMYLASE in the last  72 hours. Cardiac Enzymes  Recent Labs  12/12/14 2045 12/13/14 0220 12/13/14 0915  TROPONINI 0.09* 0.08* 0.07*   BNP Invalid input(s): POCBNP D-Dimer No results for input(s): DDIMER in the last 72 hours. Hemoglobin A1C No results for input(s): HGBA1C in the last 72 hours. Fasting Lipid Panel No results for input(s): CHOL, HDL, LDLCALC, TRIG, CHOLHDL, LDLDIRECT in the last 72 hours. Thyroid Function Tests  Recent Labs  12/12/14 1552  TSH 6.312*    TELE  Atrial fib with intermittent ventricular pacing  ECG  Atrial fib with LBBB and intermittent ventricular pacing  Radiology/Studies  Dg Chest 2 View  11/16/2014   CLINICAL DATA:  Pain and difficulty breathing  EXAM: CHEST  2 VIEW  COMPARISON:  October 11, 2014  FINDINGS: There is no edema or consolidation. Heart is borderline enlarged with pulmonary vascularity within normal limits. Pacemaker leads are unchanged. In addition to these which appear attached to the right atrium and right ventricle, there is a which may be extending into the azygos region. No adenopathy. No bone lesions.  IMPRESSION: Stable positioning of pacemaker leads. Heart borderline increased in size. Lungs clear.    Electronically Signed   By: Bretta Bang III M.D.   On: 11/16/2014 12:01   Dg Chest Port 1 View  12/12/2014   CLINICAL DATA:  Nausea, vomiting and weakness.  EXAM: PORTABLE CHEST - 1 VIEW  COMPARISON:  PA and lateral chest 11/16/2014.  FINDINGS: The patient is status post median sternotomy with an AICD in place, unchanged. There is cardiomegaly but no edema. Lungs are clear. No pneumothorax or pleural effusion.  IMPRESSION: Cardiomegaly without acute disease.   Electronically Signed   By: Drusilla Kanner M.D.   On: 12/12/2014 15:47    ASSESSMENT AND PLAN  1. Atrial fib with a controlled ventricular response, s/p DCCV with ERAF 2. Atrial flutter (atypical) with a RVR 3. VT 4. HCM, s/p septal myomectomy Rec: I have discussed a strategy of rate control. The patient states that she would like to try amiodarone again with Zofran prior to [facility] in hopes of preventing her nausea which had been severe in the past. I am skeptical of this approach but agree that it is reasonable to try.  Akeylah Hendel,M.D.  12/14/2014 7:30 AM

## 2014-12-15 DIAGNOSIS — I4892 Unspecified atrial flutter: Secondary | ICD-10-CM

## 2014-12-15 MED ORDER — ONDANSETRON HCL 8 MG PO TABS
8.0000 mg | ORAL_TABLET | Freq: Once | ORAL | Status: AC
Start: 2014-12-15 — End: 2014-12-15
  Administered 2014-12-15: 8 mg via ORAL
  Filled 2014-12-15: qty 1
  Filled 2014-12-15: qty 2

## 2014-12-15 MED ORDER — FUROSEMIDE 40 MG PO TABS
40.0000 mg | ORAL_TABLET | Freq: Every day | ORAL | Status: DC
  Administered 2014-12-15 – 2014-12-17 (×3): 40 mg via ORAL
  Filled 2014-12-15 (×3): qty 1

## 2014-12-15 MED ORDER — ONDANSETRON HCL 4 MG PO TABS
4.0000 mg | ORAL_TABLET | Freq: Once | ORAL | Status: AC
  Administered 2014-12-15: 4 mg via ORAL
  Filled 2014-12-15: qty 1

## 2014-12-15 NOTE — Progress Notes (Signed)
Patient Profile: 44 y/o female admitted for syncope in the setting of atrial flutter w/ RVR. S/p DCCV to NSR 12/13/14. Unfortunately reverted back to atrial flutter.   Subjective: Currently asymptomatic, but continues to note mild nausea several hours after taking amiodarone. IV Zofran has helped reduced nausea immediately after amio. She is requesting zofran midway in between doses. She is afraid she will not tolerate amiodarone once she is on PO zofran.   Objective: Vital signs in last 24 hours: Temp:  [97.8 F (36.6 C)-99.8 F (37.7 C)] 99.8 F (37.7 C) (03/17 0821) Pulse Rate:  [63-107] 74 (03/17 0820) Resp:  [9-26] 15 (03/17 0820) BP: (100-145)/(53-81) 106/69 mmHg (03/17 0820) SpO2:  [90 %-100 %] 99 % (03/17 0934) Weight:  [236 lb 6.4 oz (107.23 kg)] 236 lb 6.4 oz (107.23 kg) (03/17 0300) Last BM Date: 12/13/14  Intake/Output from previous day: 03/16 0701 - 03/17 0700 In: 720 [P.O.:720] Out: -  Intake/Output this shift: Total I/O In: 240 [P.O.:240] Out: -   Medications Current Facility-Administered Medications  Medication Dose Route Frequency Provider Last Rate Last Dose  . 0.9 %  sodium chloride infusion  250 mL Intravenous PRN Darrol JumpRhonda G Barrett, PA-C   Stopped at 12/12/14 1905  . 0.9 %  sodium chloride infusion  250 mL Intravenous Continuous Janetta HoraKathryn R Thompson, PA-C   250 mL at 12/13/14 1045  . acetaminophen (TYLENOL) tablet 650 mg  650 mg Oral Q4H PRN Rhonda G Barrett, PA-C      . acidophilus (RISAQUAD) capsule 1 capsule  1 capsule Oral Daily Mihai Croitoru, MD   1 capsule at 12/15/14 0947  . albuterol (PROVENTIL) (2.5 MG/3ML) 0.083% nebulizer solution 2.5 mg  2.5 mg Nebulization Q4H PRN Mihai Croitoru, MD      . ALPRAZolam Prudy Feeler(XANAX) tablet 0.25 mg  0.25 mg Oral BID PRN Joline Salthonda G Barrett, PA-C      . ALPRAZolam (XANAX) tablet 1 mg  1 mg Oral BID Joline SaltRhonda G Barrett, PA-C   1 mg at 12/15/14 0947  . amiodarone (PACERONE) tablet 200 mg  200 mg Oral BID Marinus MawGregg W Taylor, MD   200 mg  at 12/15/14 1031  . apixaban (ELIQUIS) tablet 5 mg  5 mg Oral BID Joline SaltRhonda G Barrett, PA-C   5 mg at 12/15/14 0510  . aspirin EC tablet 81 mg  81 mg Oral Daily Mihai Croitoru, MD   81 mg at 12/15/14 0947  . azelastine (ASTELIN) 0.1 % nasal spray 1 spray  1 spray Each Nare BID Joline Salthonda G Barrett, PA-C   1 spray at 12/13/14 2040  . docusate sodium (COLACE) capsule 100 mg  100 mg Oral BID PRN Mihai Croitoru, MD      . fluticasone (FLOVENT HFA) 44 MCG/ACT inhaler 1 puff  1 puff Inhalation BID Joline Salthonda G Barrett, PA-C   1 puff at 12/15/14 0933  . gabapentin (NEURONTIN) capsule 600 mg  600 mg Oral TID Thurmon FairMihai Croitoru, MD   600 mg at 12/15/14 0946  . levothyroxine (SYNTHROID, LEVOTHROID) tablet 50 mcg  50 mcg Oral QAC breakfast Joline SaltRhonda G Barrett, PA-C   50 mcg at 12/15/14 0946  . loratadine (CLARITIN) tablet 10 mg  10 mg Oral Daily Joline SaltRhonda G Barrett, PA-C   10 mg at 12/15/14 0946  . magnesium gluconate (MAGONATE) tablet 250 mg  250 mg Oral Daily Mihai Croitoru, MD   250 mg at 12/15/14 0946  . metoprolol succinate (TOPROL-XL) 24 hr tablet 75 mg  75 mg Oral BID Marinus MawGregg W Taylor,  MD   75 mg at 12/15/14 0825  . mexiletine (MEXITIL) capsule 300 mg  300 mg Oral 3 times per day Marinus Maw, MD   300 mg at 12/15/14 0509  . nitroGLYCERIN (NITROSTAT) SL tablet 0.4 mg  0.4 mg Sublingual Q5 Min x 3 PRN Rhonda G Barrett, PA-C      . ondansetron (ZOFRAN) injection 4 mg  4 mg Intravenous Q6H PRN Rhonda G Barrett, PA-C      . ondansetron (ZOFRAN) injection 4 mg  4 mg Intravenous Q12H Marinus Maw, MD   4 mg at 12/15/14 0946  . oxyCODONE-acetaminophen (PERCOCET/ROXICET) 5-325 MG per tablet 1 tablet  1 tablet Oral Q4H PRN Mihai Croitoru, MD       And  . oxyCODONE (Oxy IR/ROXICODONE) immediate release tablet 2.5 mg  2.5 mg Oral Q4H PRN Mihai Croitoru, MD      . ranolazine (RANEXA) 12 hr tablet 1,000 mg  1,000 mg Oral BID Joline Salt Barrett, PA-C   1,000 mg at 12/14/14 0604  . sertraline (ZOLOFT) tablet 50 mg  50 mg Oral Daily  Mihai Croitoru, MD   50 mg at 12/15/14 0946  . sodium chloride 0.9 % injection 3 mL  3 mL Intravenous Q12H Rhonda G Barrett, PA-C   3 mL at 12/14/14 1154  . sodium chloride 0.9 % injection 3 mL  3 mL Intravenous PRN Rhonda G Barrett, PA-C      . sodium chloride 0.9 % injection 3 mL  3 mL Intravenous Q12H Janetta Hora, PA-C   3 mL at 12/15/14 0947  . sodium chloride 0.9 % injection 3 mL  3 mL Intravenous PRN Janetta Hora, PA-C      . zolpidem (AMBIEN) tablet 5 mg  5 mg Oral QHS PRN Darrol Jump, PA-C        PE: General appearance: alert, cooperative and no distress Neck: no carotid bruit and no JVD Lungs: clear to auscultation bilaterally Heart: regular rate and rhythm, S1, S2 normal, no murmur, click, rub or gallop Extremities: no LEE Pulses: 2+ and symmetric Skin: warm and dry Neurologic: Grossly normal  Lab Results:   Recent Labs  12/12/14 1455  WBC 6.0  HGB 14.4  HCT 43.2  PLT 268   BMET  Recent Labs  12/12/14 1455 12/13/14 0220 12/14/14 0029  NA 137 140  --   K 4.0 4.5 3.9  CL 104 108  --   CO2 23 26  --   GLUCOSE 140* 106*  --   BUN 11 10  --   CREATININE 0.92 0.84  --   CALCIUM 8.9 8.3*  --      Assessment/Plan    Principal Problem:   Syncope Active Problems:   Chronic combined systolic and diastolic CHF (congestive heart failure)   Atrial flutter with rapid ventricular response   Atrial fibrillation with RVR   1. Atrial Flutter: in NSR on PO amiodarone. She has concerns regarding intolerance to amiodarone. She continues to have associated nausea after taking amiodarone, even with IV zofran. She is afraid she will not be able to continue amiodarone at home with PO zofran. She reports she has failed other AADs in the past. She had a prior ablation and reports that she stayed in SR for at least a year. ? Repeat ablation. Would also recommend sleep study, due to captured apnea on telemetry and obesity/ body habitus. Continue Eliquis. Dr.  Graciela Husbands to follow with further recommendations.  LOS: 3 days    Brittainy M. Sharol Harness, PA-C 12/15/2014 12:50 PM  As above

## 2014-12-16 ENCOUNTER — Encounter (HOSPITAL_COMMUNITY): Payer: BLUE CROSS/BLUE SHIELD

## 2014-12-16 MED ORDER — AMIODARONE HCL 200 MG PO TABS
400.0000 mg | ORAL_TABLET | Freq: Every day | ORAL | Status: DC
  Administered 2014-12-16: 400 mg via ORAL
  Filled 2014-12-16 (×3): qty 2

## 2014-12-16 MED ORDER — POTASSIUM CHLORIDE CRYS ER 10 MEQ PO TBCR
10.0000 meq | EXTENDED_RELEASE_TABLET | ORAL | Status: DC
  Administered 2014-12-16: 10 meq via ORAL
  Filled 2014-12-16: qty 1

## 2014-12-16 MED ORDER — ONDANSETRON HCL 4 MG PO TABS
4.0000 mg | ORAL_TABLET | ORAL | Status: DC
  Administered 2014-12-16: 4 mg via ORAL
  Filled 2014-12-16 (×2): qty 1

## 2014-12-16 NOTE — Progress Notes (Signed)
Patient Profile: 44 y/o female admitted for syncope in the setting of atrial flutter w/ RVR. S/p DCCV to NSR 12/13/14. Unfortunately reverted back to atrial flutter.   Subjective: Resumed amiodarone which was previously not tolerated 2/2 nausea We have written for it to be given with fofran and taken at night  Objective: Vital signs in last 24 hours: Temp:  [98.1 F (36.7 C)-99.8 F (37.7 C)] 98.3 F (36.8 C) (03/18 0300) Pulse Rate:  [74-76] 75 (03/18 0300) Resp:  [14-17] 17 (03/18 0300) BP: (106-125)/(61-75) 124/69 mmHg (03/18 0300) SpO2:  [94 %-99 %] 98 % (03/18 0300) Weight:  [99.292 kg (218 lb 14.4 oz)] 99.292 kg (218 lb 14.4 oz) (03/18 0300) Last BM Date: 12/13/14  Intake/Output from previous day: 03/17 0701 - 03/18 0700 In: 486 [P.O.:480; I.V.:6] Out: -  Intake/Output this shift:    Medications Current Facility-Administered Medications  Medication Dose Route Frequency Provider Last Rate Last Dose  . 0.9 %  sodium chloride infusion  250 mL Intravenous PRN Darrol Jump, PA-C   Stopped at 12/12/14 1905  . 0.9 %  sodium chloride infusion  250 mL Intravenous Continuous Janetta Hora, PA-C   250 mL at 12/13/14 1045  . acetaminophen (TYLENOL) tablet 650 mg  650 mg Oral Q4H PRN Rhonda G Barrett, PA-C      . acidophilus (RISAQUAD) capsule 1 capsule  1 capsule Oral Daily Mihai Croitoru, MD   1 capsule at 12/15/14 0947  . albuterol (PROVENTIL) (2.5 MG/3ML) 0.083% nebulizer solution 2.5 mg  2.5 mg Nebulization Q4H PRN Mihai Croitoru, MD      . ALPRAZolam Prudy Feeler) tablet 0.25 mg  0.25 mg Oral BID PRN Joline Salt Barrett, PA-C      . ALPRAZolam Prudy Feeler) tablet 1 mg  1 mg Oral BID Joline Salt Barrett, PA-C   1 mg at 12/15/14 2110  . amiodarone (PACERONE) tablet 200 mg  200 mg Oral BID Marinus Maw, MD   200 mg at 12/15/14 2109  . apixaban (ELIQUIS) tablet 5 mg  5 mg Oral BID Joline Salt Barrett, PA-C   5 mg at 12/15/14 1702  . aspirin EC tablet 81 mg  81 mg Oral Daily Mihai Croitoru,  MD   81 mg at 12/15/14 0947  . azelastine (ASTELIN) 0.1 % nasal spray 1 spray  1 spray Each Nare BID Joline Salt Barrett, PA-C   1 spray at 12/13/14 2040  . docusate sodium (COLACE) capsule 100 mg  100 mg Oral BID PRN Mihai Croitoru, MD      . fluticasone (FLOVENT HFA) 44 MCG/ACT inhaler 1 puff  1 puff Inhalation BID Joline Salt Barrett, PA-C   1 puff at 12/15/14 2100  . furosemide (LASIX) tablet 40 mg  40 mg Oral Daily Brittainy Sherlynn Carbon, PA-C   40 mg at 12/15/14 1502  . gabapentin (NEURONTIN) capsule 600 mg  600 mg Oral TID Thurmon Fair, MD   600 mg at 12/15/14 2109  . levothyroxine (SYNTHROID, LEVOTHROID) tablet 50 mcg  50 mcg Oral QAC breakfast Joline Salt Barrett, PA-C   50 mcg at 12/15/14 0946  . loratadine (CLARITIN) tablet 10 mg  10 mg Oral Daily Joline Salt Barrett, PA-C   10 mg at 12/15/14 0946  . magnesium gluconate (MAGONATE) tablet 250 mg  250 mg Oral Daily Mihai Croitoru, MD   250 mg at 12/15/14 0946  . metoprolol succinate (TOPROL-XL) 24 hr tablet 75 mg  75 mg Oral BID Marinus Maw, MD   [phone]  mg at 12/15/14 2013  . mexiletine (MEXITIL) capsule 300 mg  300 mg Oral 3 times per day Marinus MawGregg W Taylor, MD   300 mg at 12/16/14 0630  . nitroGLYCERIN (NITROSTAT) SL tablet 0.4 mg  0.4 mg Sublingual Q5 Min x 3 PRN Rhonda G Barrett, PA-C      . ondansetron (ZOFRAN) injection 4 mg  4 mg Intravenous Q6H PRN Rhonda G Barrett, PA-C      . oxyCODONE-acetaminophen (PERCOCET/ROXICET) 5-325 MG per tablet 1 tablet  1 tablet Oral Q4H PRN Mihai Croitoru, MD       And  . oxyCODONE (Oxy IR/ROXICODONE) immediate release tablet 2.5 mg  2.5 mg Oral Q4H PRN Mihai Croitoru, MD      . ranolazine (RANEXA) 12 hr tablet 1,000 mg  1,000 mg Oral BID Rhonda G Barrett, PA-C   1,000 mg at 12/16/14 0630  . sertraline (ZOLOFT) tablet 50 mg  50 mg Oral Daily Mihai Croitoru, MD   50 mg at 12/15/14 0946  . sodium chloride 0.9 % injection 3 mL  3 mL Intravenous Q12H Rhonda G Barrett, PA-C   3 mL at 12/15/14 2111  . sodium chloride 0.9 %  injection 3 mL  3 mL Intravenous PRN Rhonda G Barrett, PA-C      . sodium chloride 0.9 % injection 3 mL  3 mL Intravenous Q12H Janetta HoraKathryn R Thompson, PA-C   3 mL at 12/15/14 2110  . sodium chloride 0.9 % injection 3 mL  3 mL Intravenous PRN Janetta HoraKathryn R Thompson, PA-C      . zolpidem (AMBIEN) tablet 5 mg  5 mg Oral QHS PRN Darrol Jumphonda G Barrett, PA-C        PE: General appearance: alert, cooperative and no distress Neck: no carotid bruit and no JVD Lungs: clear to auscultation bilaterally Heart: regular rate and rhythm, S1, S2 normal, no murmur, click, rub or gallop Extremities: no LEE Pulses: 2+ and symmetric Skin: warm and dry Neurologic: Grossly normal  Lab Results:  No results for input(s): WBC, HGB, HCT, PLT in the last 72 hours. BMET  Recent Labs  12/14/14 0029  K 3.9     Assessment/Plan    Principal Problem:   Syncope Active Problems:   Chronic combined systolic and diastolic CHF (congestive heart failure)   Atrial flutter with rapid ventricular response   Atrial fibrillation with RVR   1. Atrial Flutter:  contnue amio + ranolazine for now  Why is she on ASA--Defer to primary cardiologists  No nausea this am YEAH  Will tx to floor and anticipate discharge 24-48 hrs depending on her symptoms   LOS: 4 days    Brittainy M. Sharol HarnessSimmons, PA-C 12/16/2014 7:25 AM  As above

## 2014-12-17 ENCOUNTER — Encounter (HOSPITAL_COMMUNITY): Payer: Self-pay | Admitting: Nurse Practitioner

## 2014-12-17 MED ORDER — METOPROLOL SUCCINATE ER 50 MG PO TB24: ORAL_TABLET | ORAL | Status: DC

## 2014-12-17 MED ORDER — ONDANSETRON HCL 4 MG PO TABS
4.0000 mg | ORAL_TABLET | Freq: Every day | ORAL | Status: DC
Start: 2014-12-17 — End: 2015-01-02

## 2014-12-17 MED ORDER — AMIODARONE HCL 400 MG PO TABS: 400.0000 mg | ORAL_TABLET | Freq: Every day | ORAL | Status: DC

## 2014-12-17 MED ORDER — METOPROLOL SUCCINATE ER 25 MG PO TB24: 75.0000 mg | ORAL_TABLET | Freq: Every day | ORAL | Status: DC

## 2014-12-17 MED ORDER — AMIODARONE HCL 200 MG PO TABS
400.0000 mg | ORAL_TABLET | Freq: Every day | ORAL | Status: DC
Start: 2014-12-17 — End: 2015-01-27

## 2014-12-17 NOTE — Discharge Instructions (Signed)
***  PLEASE REMEMBER TO BRING ALL OF YOUR MEDICATIONS TO EACH OF YOUR FOLLOW-UP OFFICE VISITS.  

## 2014-12-17 NOTE — Progress Notes (Signed)
12/17/2014 3:56 PM Pt. Voiced concerns about not being able to afford medication.  Social worker called. Kathryne HitchAllen, Sharada Albornoz C

## 2014-12-17 NOTE — Progress Notes (Signed)
    SUBJECTIVE:  Patient is tolerating amiodarone with Zofran being used. So far today she feels good.   Filed Vitals:   12/16/14 1220 12/16/14 1418 12/16/14 2046 12/17/14 0552  BP: 102/55 97/56 100/53 99/58  Pulse: 76 71 75 75  Temp: 98 F (36.7 C) 98 F (36.7 C) 98.1 F (36.7 C) 98.1 F (36.7 C)  TempSrc: Oral Oral Oral Oral  Resp:   17 16  Height:      Weight:    235 lb 10.8 oz (106.9 kg)  SpO2: 96%  98% 98%    No intake or output data in the 24 hours ending 12/17/14 0822  LABS: Basic Metabolic Panel: No results for input(s): NA, K, CL, CO2, GLUCOSE, BUN, CREATININE, CALCIUM, MG, PHOS in the last 72 hours. Liver Function Tests: No results for input(s): AST, ALT, ALKPHOS, BILITOT, PROT, ALBUMIN in the last 72 hours. No results for input(s): LIPASE, AMYLASE in the last 72 hours. CBC: No results for input(s): WBC, NEUTROABS, HGB, HCT, MCV, PLT in the last 72 hours. Cardiac Enzymes: No results for input(s): CKTOTAL, CKMB, CKMBINDEX, TROPONINI in the last 72 hours. BNP: Invalid input(s): POCBNP D-Dimer: No results for input(s): DDIMER in the last 72 hours. Hemoglobin A1C: No results for input(s): HGBA1C in the last 72 hours. Fasting Lipid Panel: No results for input(s): CHOL, HDL, LDLCALC, TRIG, CHOLHDL, LDLDIRECT in the last 72 hours. Thyroid Function Tests: No results for input(s): TSH, T4TOTAL, T3FREE, THYROIDAB in the last 72 hours.  Invalid input(s): FREET3  RADIOLOGY: Dg Chest Port 1 View  12/12/2014   CLINICAL DATA:  Nausea, vomiting and weakness.  EXAM: PORTABLE CHEST - 1 VIEW  COMPARISON:  PA and lateral chest 11/16/2014.  FINDINGS: The patient is status post median sternotomy with an AICD in place, unchanged. There is cardiomegaly but no edema. Lungs are clear. No pneumothorax or pleural effusion.  IMPRESSION: Cardiomegaly without acute disease.   Electronically Signed   By: Drusilla Kannerhomas  Dalessio M.D.   On: 12/12/2014 15:47    PHYSICAL EXAM patient is oriented to  person time and place. Affect is normal. Lungs are clear. Respiratory effort is nonlabored. Cardiac exam reveals an S1 and S2. There is no peripheral edema.   TELEMETRY: I have reviewed telemetry today December 17, 2014. There is atrial pacing.   ASSESSMENT AND PLAN:     Syncope    Chronic combined systolic and diastolic CHF (congestive heart failure)        Her volume status is stable.    Atrial flutter with rapid ventricular response   Atrial fibrillation with RVR     The patient has atrial pacing at this time. She is tolerating amiodarone with Zofran. The patient does a very good job of assessing her overall status. She will ambulate this morning. We will check back with her after lunch to see how she's feeling. If she feels well, she will be discharged home later this afternoon. If not we will keep her until tomorrow.  Willa RoughJeffrey Kaitlyn Skowron 12/17/2014 8:22 AM

## 2014-12-17 NOTE — Discharge Summary (Signed)
Discharge Summary   Patient ID: [name],  MRN: [id number], DOB/AGE: 12/09/1970 44 y.o.  Admit date: 12/12/2014 Discharge date: 12/17/2014  Primary Care Provider: Eustaquio Boyden Primary Cardiologist: Judie Petit. Excell Seltzer, MD / Reece Agar. Ladona Ridgel, MD   Discharge Diagnoses Principal Problem:   Syncope  **In the setting of aflutter and RVR.  Active Problems:   VT (ventricular tachycardia)   Atrial flutter with rapid ventricular response   Hypertrophic cardiomyopathy- s/p myomectomy   Chronic combined systolic and diastolic CHF (congestive heart failure)   Nausea & vomiting   Obesity   Hypokalemia   History of mitral valve replacement   ICD (implantable cardioverter-defibrillator) in place   Allergies Allergies  Allergen Reactions  . Amiodarone Nausea And Vomiting    Amiodarone tablets cause nausea Can tolerate IV Amiodarone  . Phenergan [Promethazine Hcl] Itching  . Warfarin And Related Other (See Comments)    'burns my skin'  . Nitroglycerin Other (See Comments)    Hypotension   Procedures  Direct Current Cardioversion 3.15.2015  [facility]: Anesthesia propofol 70 mg  Flutter with V pacing confirmed by device with [employer] DCC x 1 120 J bipasic  NSR rate 80  No immediate neurologic sequelae _____________   History of Present Illness  44 y/o female with the above complex problem list.  She was in her usoh until 12/12/2014 when she developed nausea, diarrhea, presyncope, and diaphoresis.  She then lost consciousness and her boyfriend called 911.  She was transported to the [facility] ED via EMS where ECG was abnormal and initially a code STEMI was called. She was not having any chest pain and code STEMI was called off. Her ICD was interrogated and showed recurrent episodes of atrial flutter with, initially with 2:1 AV conduction with subsequent deterioration 1:1 conduction with atrial cycle length of 330 ms and ventricular cycle length of 295 ms. The ventricular rate was under her detection  zone of 240 bpm and as a result, she did not receive an ICD shock. In the emergency department, her atrial flutter was rate controlled in the 90s with 2:1 conduction. She was admitted for further electrophysiological evaluation.  Hospital Course  Patient remained in atrial flutter and was seen by electrophysiology. An attempt was made to pace her back to sinus rhythm using her device however this was unsuccessful. She in fact went into a faster flutter and therefore underwent direct current cardioversion on March 15, which was initially successful. Unfortunately, she reverted back to atrial flutter by March 16. Patient wished to try amiodarone again. She had previously had severe nausea with oral amiodarone. She was placed on Zofran and initially she did experience nausea with amiodarone loading but this subsequently subsided. She has been tolerating amiodarone 400 mg daily when combined with Zofran 4 mg daily. Back on amiodarone, she has been maintaining sinus rhythm and is felt to be ready for discharge today.  Discharge Vitals Blood pressure 95/50, pulse 75, temperature 98.1 F (36.7 C), temperature source Oral, resp. rate 16, height 5' (1.524 m), weight 235 lb 10.8 oz (106.9 kg), SpO2 98 %.  Filed Weights   12/15/14 0300 12/16/14 0300 12/17/14 0552  Weight: 236 lb 6.4 oz (107.23 kg) 218 lb 14.4 oz (99.292 kg) 235 lb 10.8 oz (106.9 kg)    Labs  CBC Lab Results  Component Value Date   WBC 6.0 12/12/2014   HGB 14.4 12/12/2014   HCT 43.2 12/12/2014   MCV 92.5 12/12/2014   PLT 268 12/12/2014    Basic  Metabolic Panel Lab Results  Component Value Date   CREATININE 0.84 12/13/2014   BUN 10 12/13/2014   NA 140 12/13/2014   K 3.9 12/14/2014   CL 108 12/13/2014   CO2 26 12/13/2014   Liver Function Tests Lab Results  Component Value Date   ALT 45* 12/12/2014   AST 45* 12/12/2014   ALKPHOS 52 12/12/2014   BILITOT 0.7 12/12/2014    Thyroid Function Tests Lab Results  Component  Value Date   TSH 6.312* 12/12/2014    Disposition  Pt is being discharged home today in good condition.  Follow-up Plans & Appointments      Follow-up Information    Follow up with Tonny BollmanMichael Cooper, MD On 02/07/2015.   Specialty:  Cardiology   Why:  8:45 AM.   Contact information:   [address] [place] [address] [phone]       Follow up with Lewayne BuntingGregg Taylor, MD In 1 week.   Specialty:  Cardiology   Why:  we will arrange for follow-up and contact you.   Contact information:   [address] [place] [address] [phone]      Discharge Medications    Medication List    STOP taking these medications        aspirin EC 81 MG tablet      TAKE these medications        acetaminophen 500 MG tablet  Commonly known as:  TYLENOL  Take 500-1,000 mg by mouth 2 (two) times daily as needed for mild pain, moderate pain, fever or headache (pain).     albuterol 108 (90 BASE) MCG/ACT inhaler  Commonly known as:  PROVENTIL HFA;VENTOLIN HFA  Inhale 1 puff into the lungs every 4 (four) hours as needed for wheezing or shortness of breath.     ALPRAZolam 1 MG tablet  Commonly known as:  XANAX  Take 1 tablet (1 mg total) by mouth 3 (three) times daily as needed for anxiety.     amiodarone 200 MG tablet  Commonly known as:  PACERONE  Take 2 tablets (400 mg total) by mouth daily.     amoxicillin 500 MG capsule  Commonly known as:  AMOXIL  Take 4 capsules (2,000 mg total) by mouth once. For dental procedure     apixaban 5 MG Tabs tablet  Commonly known as:  ELIQUIS  Take 1 tablet (5 mg total) by mouth 2 (two) times daily.     beclomethasone 80 MCG/ACT inhaler  Commonly known as:  QVAR  Inhale 1 puff into the lungs 2 (two) times daily. 9am and 8pm     cetirizine 10 MG tablet  Commonly known as:  ZYRTEC  Take 10 mg by mouth daily.     docusate sodium 100 MG capsule  Commonly known as:  COLACE  Take 100 mg by mouth 2 (two) times  daily as needed for mild constipation.     furosemide 40 MG tablet  Commonly known as:  LASIX  Take 1 tablet (40 mg total) by mouth daily.     gabapentin 300 MG capsule  Commonly known as:  NEURONTIN  TAKE TWO CAPSULES BY MOUTH THREE TIMES DAILY     levonorgestrel 20 MCG/24HR IUD  Commonly known as:  MIRENA  1 each by Intrauterine route once.     levothyroxine 50 MCG tablet  Commonly known as:  SYNTHROID, LEVOTHROID  TAKE ONE TABLET BY MOUTH ONCE DAILY BEFORE BREAKFAST  magnesium gluconate 500 MG tablet  Commonly known as:  MAGONATE  Take 250 mg by mouth 2 (two) times daily.     metoprolol succinate 25 MG 24 hr tablet  Commonly known as:  TOPROL-XL  Take 3 tablets (75 mg total) by mouth daily.     mexiletine 150 MG capsule  Commonly known as:  MEXITIL  Take 2 capsules (300 mg total) by mouth 3 (three) times daily.     ondansetron 4 MG tablet  Commonly known as:  ZOFRAN  Take 1 tablet (4 mg total) by mouth daily.     oxyCODONE-acetaminophen 7.5-325 MG per tablet  Commonly known as:  PERCOCET  Take 1 tablet by mouth every 4 (four) hours as needed.     PATANASE 0.6 % Soln  Generic drug:  Olopatadine HCl  Place 1 puff into the nose as needed (for allergies).     Potassium Gluconate 595 MG Caps  Take 1 capsule (595 mg total) by mouth daily. OK to take extra on days you need extra lasix     ranolazine 1000 MG SR tablet  Commonly known as:  RANEXA  Take 1 tablet (1,000 mg total) by mouth 2 (two) times daily.     sertraline 50 MG tablet  Commonly known as:  ZOLOFT  TAKE ONE TABLET BY MOUTH ONCE DAILY       Outstanding Labs/Studies  None  Duration of Discharge Encounter   Greater than 30 minutes including physician time.  Signed, Nicolasa Ducking NP 12/17/2014, 11:44 AM Patient seen and examined. I agree with the assessment and plan as detailed above. See also my additional thoughts below.   Please refer to my progress note from today also. I made the  decision for the patient to go home. I agree with the note above. We have made very careful discharge planning.  Willa Rough, MD, [facility] 12/17/2014 12:20 PM

## 2014-12-17 NOTE — Progress Notes (Signed)
12/17/2014 6:07 PM Discharge AVS meds taken today and those due this evening reviewed.  Follow-up appointments and when to call md reviewed.  D/C IV and TELE.  Questions and concerns addressed.   D/C home per orders. Kathryne HitchAllen, Talin Rozeboom C

## 2014-12-19 ENCOUNTER — Telehealth: Payer: Self-pay | Admitting: Internal Medicine

## 2014-12-19 ENCOUNTER — Encounter (HOSPITAL_COMMUNITY): Payer: BLUE CROSS/BLUE SHIELD

## 2014-12-19 NOTE — Telephone Encounter (Signed)
TOC pt--fu appt 12-26-14 with Lori--d/c 3-19 per Ward Givenshris Berge

## 2014-12-19 NOTE — Telephone Encounter (Signed)
[facility] on December 17, 2014  Patient understands to follow up with provider --Norma FredricksonLori Gerhardt, NP on December 26, 2014 at 9:30. Patient understands discharge instructions?  yes Patient understands medications and regiment? Yes.  States she is unable to afford Mexiletine and Eliquis. Patient understands to bring all medications to this visit? yes  Pt reports she cannot afford Mexiletine and Eliquis.  Notes indicate social work was consulted during pt's hospitalization regarding concerns related to cost of medications.  Pt reports she was told because she has insurance she was unable to get help with medication cost. Will forward to Dr. Ladona Ridgelaylor for review and recommendations.

## 2014-12-20 NOTE — Telephone Encounter (Signed)
F/U        Pt called for samples of Eliquis.   No samples avail., pt requesting for Dr. Ladona Ridgelaylor to contact [facility] to see if an arrangement could be worked out.   Pt informed that may not be possibe, but insisted.    Please return pt called about medication concerns.

## 2014-12-20 NOTE — Telephone Encounter (Signed)
Spoke to the patient and she has 1 week left of Eliquis  This is the only one that is costly to her.  It is $70 per rx.

## 2014-12-21 ENCOUNTER — Telehealth: Payer: Self-pay

## 2014-12-21 ENCOUNTER — Encounter (HOSPITAL_COMMUNITY): Payer: BLUE CROSS/BLUE SHIELD

## 2014-12-21 NOTE — Telephone Encounter (Signed)
Pt goes to pain mgt and pt wants to know if taking oxycodone at night and next morning took furosemide 40 mg and pts had urine drug screening which showed that oxycodone was not in pts system and pt wants to know how long oxycodone stays in your system and could lasix have caused pt to have voided so much that oxycodone was washed out of pts body. Pt was also [facility] and was advised by a nurse at hospital that she might want to get sleep study done to r/o sleep apnea; while pt was in hospital pt was given oxygen and had low pulse ox on and off. Pt said she does not snore but wants to know how to get scheduled for sleep study. Pt last seen 08/09/14. Pt request cb.

## 2014-12-21 NOTE — Telephone Encounter (Signed)
Pt called back; pt was asleep when spoke with kim CMA; advised pt as instructed and pt voiced understanding.

## 2014-12-21 NOTE — Telephone Encounter (Signed)
Patient notified and verbalized understanding. 

## 2014-12-21 NOTE — Telephone Encounter (Signed)
I don't think lasix should cause oxycodone to leave system so quickly - would ask pain doc.  Would have her check with cards at appt next week re sleep doctor referral. I don't see in records where this was recommended. Lab Results  Component Value Date   CREATININE 0.84 12/13/2014

## 2014-12-23 ENCOUNTER — Encounter (HOSPITAL_COMMUNITY): Payer: BLUE CROSS/BLUE SHIELD

## 2014-12-23 ENCOUNTER — Telehealth: Payer: Self-pay

## 2014-12-23 NOTE — Telephone Encounter (Signed)
Patient's friend came to office to pick up samples of eliquis 5 mg . She had talked to [place] and there was a misunderstanding about were the samples were.

## 2014-12-26 ENCOUNTER — Telehealth (HOSPITAL_COMMUNITY): Payer: Self-pay | Admitting: Cardiac Rehabilitation

## 2014-12-26 ENCOUNTER — Ambulatory Visit (INDEPENDENT_AMBULATORY_CARE_PROVIDER_SITE_OTHER): Payer: BLUE CROSS/BLUE SHIELD | Admitting: *Deleted

## 2014-12-26 ENCOUNTER — Encounter (HOSPITAL_COMMUNITY): Payer: BLUE CROSS/BLUE SHIELD

## 2014-12-26 ENCOUNTER — Encounter: Payer: Self-pay | Admitting: Nurse Practitioner

## 2014-12-26 ENCOUNTER — Ambulatory Visit (INDEPENDENT_AMBULATORY_CARE_PROVIDER_SITE_OTHER): Payer: BLUE CROSS/BLUE SHIELD | Admitting: Nurse Practitioner

## 2014-12-26 VITALS — BP 94/60 | HR 75 | Ht 60.0 in | Wt 234.0 lb

## 2014-12-26 DIAGNOSIS — I4891 Unspecified atrial fibrillation: Secondary | ICD-10-CM

## 2014-12-26 DIAGNOSIS — I472 Ventricular tachycardia, unspecified: Secondary | ICD-10-CM

## 2014-12-26 DIAGNOSIS — Z79899 Other long term (current) drug therapy: Secondary | ICD-10-CM | POA: Diagnosis not present

## 2014-12-26 DIAGNOSIS — I4901 Ventricular fibrillation: Secondary | ICD-10-CM | POA: Diagnosis not present

## 2014-12-26 DIAGNOSIS — R55 Syncope and collapse: Secondary | ICD-10-CM | POA: Diagnosis not present

## 2014-12-26 DIAGNOSIS — Z95810 Presence of automatic (implantable) cardiac defibrillator: Secondary | ICD-10-CM

## 2014-12-26 LAB — MDC_IDC_ENUM_SESS_TYPE_INCLINIC
HIGH POWER IMPEDANCE MEASURED VALUE: 41 Ohm
HighPow Impedance: 55 Ohm
Lead Channel Impedance Value: 510 Ohm
Lead Channel Pacing Threshold Amplitude: 0.9 V
Lead Channel Pacing Threshold Pulse Width: 0.5 ms
Lead Channel Pacing Threshold Pulse Width: 0.5 ms
Lead Channel Sensing Intrinsic Amplitude: 16.2 mV
Lead Channel Setting Pacing Amplitude: 2.4 V
Lead Channel Setting Pacing Pulse Width: 0.5 ms
MDC IDC MSMT LEADCHNL RA IMPEDANCE VALUE: 613 Ohm
MDC IDC MSMT LEADCHNL RA SENSING INTR AMPL: 6.2 mV
MDC IDC MSMT LEADCHNL RV PACING THRESHOLD AMPLITUDE: 1.2 V
MDC IDC PG SERIAL: 102591
MDC IDC SESS DTM: 20160328040000
MDC IDC SET LEADCHNL RA PACING AMPLITUDE: 2 V
MDC IDC SET LEADCHNL RV SENSING SENSITIVITY: 0.6 mV
MDC IDC SET ZONE DETECTION INTERVAL: 250 ms
Zone Setting Detection Interval: 286 ms

## 2014-12-26 LAB — BASIC METABOLIC PANEL
BUN: 10 mg/dL (ref 6–23)
CO2: 28 mEq/L (ref 19–32)
Calcium: 9.1 mg/dL (ref 8.4–10.5)
Chloride: 103 mEq/L (ref 96–112)
Creatinine, Ser: 0.72 mg/dL (ref 0.40–1.20)
GFR: 93.43 mL/min (ref >60.00)
Glucose, Bld: 176 mg/dL — ABNORMAL HIGH (ref 70–99)
Potassium: 3.9 mEq/L (ref 3.5–5.1)
Sodium: 135 mEq/L (ref 135–145)

## 2014-12-26 LAB — MAGNESIUM: Magnesium: 1.8 mg/dL (ref 1.5–2.5)

## 2014-12-26 LAB — HEPATIC FUNCTION PANEL
ALT: 34 U/L (ref 0–35)
AST: 29 U/L (ref 0–37)
Albumin: 3.6 g/dL (ref 3.5–5.2)
Alkaline Phosphatase: 62 U/L (ref 39–117)
Bilirubin, Direct: 0.1 mg/dL (ref 0.0–0.3)
Total Bilirubin: 0.4 mg/dL (ref 0.2–1.2)
Total Protein: 7.1 g/dL (ref 6.0–8.3)

## 2014-12-26 LAB — TSH: TSH: 1.35 u[IU]/mL (ref 0.35–4.50)

## 2014-12-26 NOTE — Patient Instructions (Addendum)
We will be checking the following labs today BMET, MG, TSH and HPF  Stay on your current medicines  We will get your device rechecked today  Wear knee high compression stockings during the day  See Dr. Ladona Ridgelaylor in one month -  Probably cut your amiodarone back at that visit  See Dr. Excell Seltzerooper in May as planned  Call the [facility] office at [phone] if you have any questions, problems or concerns.

## 2014-12-26 NOTE — Telephone Encounter (Signed)
-----   Message from Rosalio MacadamiaLori C Gerhardt, NP sent at 12/26/2014  9:36 AM EDT ----- Regarding: RE: cardiac rehab She would like to go back but the [facility] bus drops her off at [address]tr[address] - too far for her to walk and she is scared.  Any suggestions?  Ok to return from my standpoint.  lori ----- Message -----    From: Robyne PeersJoann H Eldora Napp, RN    Sent: 12/23/2014  12:36 PM      To: Rosalio MacadamiaLori C Gerhardt, NP Subject: cardiac rehab                                  Dear Lawson FiscalLori,   Pt has hospital f/u appt with you 12/26/14.  Please let me know when she can return to rehab after that appt.    Thank you, Deveron FurlongJoann Jazilyn Siegenthaler, RN, BSN Cardiac Pulmonary Rehab

## 2014-12-26 NOTE — Telephone Encounter (Signed)
pc to pt to discuss restarting cardiac rehab. Pt instructed to use guest services to transport to department.  Pt also states she is afraid of restarting exercise after most recent event. Pt offered reassurance and emotional support. Pt advised exercise will begin light and increase as tolerated.  Understanding verbalized.  Pt plans to return to exercise Monday 01/02/15.

## 2014-12-26 NOTE — Progress Notes (Signed)
CARDIOLOGY OFFICE NOTE  Date:  12/26/2014    [name] Date of Birth: 07/24/1971 Medical Record #[id number]  PCP:  Eustaquio Boyden, MD  Cardiologist:  [facility]  Chief Complaint  Patient presents with  . Post hospital visit for syncope in setting of AF with RVR    Post hospital visit - seen for Dr. Excell Seltzer & Ladona Ridgel     History of Present Illness: [name] is a 44 y.o. female who presents today for a post hospital visit. This was to be a TOC visit - but no phone call noted to be documented. Seen for Dr. Excell Seltzer and Ladona Ridgel. She has a complex medical history which includes VT, atrial flutter, HOCM with past myomectomy, chronic combined systolic and diastolic HF, obesity, prior MVR and underlying ICD.  She was in her usoh until 12/12/2014 when she developed nausea, diarrhea, presyncope, and diaphoresis. She then lost consciousness and her boyfriend called 911. She was transported to the [facility] ED via EMS where ECG was abnormal and initially a code STEMI was called. She was not having any chest pain and code STEMI was called off. Her ICD was interrogated and showed recurrent episodes of atrial flutter with, initially with 2:1 AV conduction with subsequent deterioration 1:1 conduction with atrial cycle length of 330 ms and ventricular cycle length of 295 ms. The ventricular rate was under her detection zone of 240 bpm and as a result, she did not receive an ICD shock. In the emergency department, her atrial flutter was rate controlled in the 90s with 2:1 conduction.   She remained in atrial flutter and was seen by EP. An attempt was made to pace her back to sinus rhythm using her device however this was unsuccessful. She in fact went into a faster flutter and therefore underwent direct current cardioversion on March 15, which was initially successful. Unfortunately, she reverted back to atrial flutter by March 16. Patient wished to try amiodarone again. She had previously had  severe nausea with oral amiodarone. She was placed on Zofran and initially she did experience nausea with amiodarone loading but this subsequently subsided. She has been tolerating amiodarone 400 mg daily when combined with Zofran 4 mg daily. Back on amiodarone she was maintaining sinus rhythm.    Comes back today. Here alone. Dizziness with position changes. She says she is "not getting sick" from the amiodarone - taking at night. Gets short of breath - weight is up - this is not new. Notes her weight has been up over the 6 months. Worried about her thyroid level. No shocks. No palpitations. Some swelling. Does not use support stockings.    Past Medical History  Diagnosis Date  . Hypertrophic cardiomyopathy     s/p septal myomectomy with implantable defibrillator 01/27/2012   . Anxiety state, unspecified   . Allergic rhinitis     to dogs  . Asthma   . Anemia     during pregnancy  . History of chicken pox   . Generalized headaches   . Ocular migraine     no HA  . Back pain     told cracked bone, vicodin didn't help, improved with percocets/muscle relaxants, no eval by prior PCP  . 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 DVT (deep venous thrombosis) 01/2012    was placed on Warfarin after cardiac surgery but had skin reaction to warfarin so was changed  to Pradaxa so DVT occurred while on Pradaxa although unclear if it occurred during transition  . Hypotension   . Atrial fibrillation     a. On pradaxa.  coumadin necrosis with warfarin;  b. Amio d/c'd 02/2012  . Pericardial effusion   . Chronic combined systolic and diastolic CHF (congestive heart failure)     a. echo 03/19/12: severe asymmetric septal hypertrophy, evidence of upper septal myomectomy, peak LV outflow tract gradient 35, EF 35%,;  b.  Echo 9/13: Severe LVH, EF 30-35%, anteroseptal and inferior HK, LVOT narrow, mild AI, mild to moderate mitral stenosis, severe LAE  .  Pericardial tamponade 03/14/2012  . 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 01/2012 s/p BSX ICD;  b. 06/2012 Multaq initiated due to recurrent syncope, VT/VF - had severe n/v with IV amio.  . Skin necrosis --COUMADIN   . Chronic chest wall pain     eval by CVTS, stable, rec return to [facility] if continued pain  . Syncope     a. in setting of VT/VF.  Marland Kitchen Hypothyroid   . Blood transfusion without reported diagnosis   . Neuromuscular disorder   . Gall stones   . Atrial flutter     a. 10/2013 with inappropriate ICD therapy; b. s/p CTI ablation by Dr Ladona Ridgel 11/16/2013;  c. 11/2013 syncope 2/2 aflutter/VT->failed DCCV->amio resumed w/ zofran for nausea.  Marland Kitchen PONV (postoperative nausea and vomiting)   . Depression   . Post-traumatic stress   . Fibromyalgia   . Hx of echocardiogram     Echo (2/16): mod LVH, EF 55-60%, no RWMA, Gr 2 DD, mild AI, mild to mod MS, mod LAE    Past Surgical History  Procedure Laterality Date  . Cesarean section  1991  . Induced abortion  1990 and 1993  . Cardiac surgery  01/27/2012    median sternotomy, septal myectomy, MVR - [facility] (Dr. Silvestre Mesi)  . Cardiac defibrillator placement  01/2012    BSX dual chamber ICD  . Myomectomy    . Pericardial window  03/14/2012    Procedure: PERICARDIAL WINDOW;  Surgeon: Purcell Nails, MD;  Location: [facility] OR;  Service: Open Heart Surgery;  Laterality: N/A;  Subxyphoid Pericardial  Window   . Ablation  11-16-13    RFCA of atrial flutter by Dr Ladona Ridgel   . Insert / replace / remove pacemaker    . Mitral valve repair    . Cholecystectomy N/A 01/26/2014    LAPAROSCOPIC CHOLECYSTECTOMY WITH INTRAOPERATIVE CHOLANGIOGRAM;  Surgeon: Wilmon Arms. Tsuei, MD  . Atrial flutter ablation N/A 11/16/2013    Procedure: ATRIAL FLUTTER ABLATION;  Surgeon: Marinus Maw, MD;  Location: [facility] CATH LAB;  Service: Cardiovascular;  Laterality: N/A;  . Cardioversion N/A 12/13/2014    Procedure: CARDIOVERSION;  Surgeon: Wendall Stade, MD;  Location: [facility] ENDOSCOPY;   Service: Cardiovascular;  Laterality: N/A;     Medications: Current Outpatient Prescriptions  Medication Sig Dispense Refill  . acetaminophen (TYLENOL) 500 MG tablet Take 500-1,000 mg by mouth 2 (two) times daily as needed for mild pain, moderate pain, fever or headache (pain).     Marland Kitchen albuterol (PROVENTIL HFA;VENTOLIN HFA) 108 (90 BASE) MCG/ACT inhaler Inhale 1 puff into the lungs every 4 (four) hours as needed for wheezing or shortness of breath.     . ALPRAZolam (XANAX) 1 MG tablet Take 1 tablet (1 mg total) by mouth 3 (three) times daily as needed for anxiety. (Patient taking differently: Take 1 mg by mouth 2 (two)  times daily. ) 70 tablet 0  . amiodarone (PACERONE) 200 MG tablet Take 2 tablets (400 mg total) by mouth daily. 60 tablet 6  . amoxicillin (AMOXIL) 500 MG capsule Take 4 capsules (2,000 mg total) by mouth once. For dental procedure 12 capsule 0  . apixaban (ELIQUIS) 5 MG TABS tablet Take 1 tablet (5 mg total) by mouth 2 (two) times daily. 180 tablet 3  . beclomethasone (QVAR) 80 MCG/ACT inhaler Inhale 1 puff into the lungs 2 (two) times daily. 9am and 8pm    . cetirizine (ZYRTEC) 10 MG tablet Take 10 mg by mouth daily.     Marland Kitchen. docusate sodium (COLACE) 100 MG capsule Take 100 mg by mouth 2 (two) times daily as needed for mild constipation.    . furosemide (LASIX) 40 MG tablet Take 1 tablet (40 mg total) by mouth daily. (Patient taking differently: Take 40 mg by mouth daily. Patient was told that if she has increased swelling to take 2 tablets) 90 tablet 3  . gabapentin (NEURONTIN) 300 MG capsule TAKE TWO CAPSULES BY MOUTH THREE TIMES DAILY 180 capsule 0  . levonorgestrel (MIRENA) 20 MCG/24HR IUD 1 each by Intrauterine route once.    Marland Kitchen. levothyroxine (SYNTHROID, LEVOTHROID) 50 MCG tablet TAKE ONE TABLET BY MOUTH ONCE DAILY BEFORE BREAKFAST 30 tablet 3  . magnesium gluconate (MAGONATE) 500 MG tablet Take 250 mg by mouth 2 (two) times daily.     . metoprolol succinate (TOPROL-XL) 25 MG 24  hr tablet Take 3 tablets (75 mg total) by mouth daily. 90 tablet 6  . mexiletine (MEXITIL) 150 MG capsule Take 2 capsules (300 mg total) by mouth 3 (three) times daily. 540 capsule 3  . Olopatadine HCl (PATANASE) 0.6 % SOLN Place 1 puff into the nose as needed (for allergies).     . ondansetron (ZOFRAN) 4 MG tablet Take 1 tablet (4 mg total) by mouth daily. 30 tablet 6  . oxyCODONE-acetaminophen (PERCOCET) 7.5-325 MG per tablet Take 1 tablet by mouth every 4 (four) hours as needed.   0  . Potassium Gluconate 595 MG CAPS Take 1 capsule (595 mg total) by mouth daily. OK to take extra on days you need extra lasix 100 capsule 3  . ranolazine (RANEXA) 1000 MG SR tablet Take 1 tablet (1,000 mg total) by mouth 2 (two) times daily. 180 tablet 3  . sertraline (ZOLOFT) 50 MG tablet TAKE ONE TABLET BY MOUTH ONCE DAILY 30 tablet 3   No current facility-administered medications for this visit.    Allergies: Allergies  Allergen Reactions  . Amiodarone Nausea And Vomiting    Amiodarone tablets cause nausea Can tolerate IV Amiodarone  . Phenergan [Promethazine Hcl] Itching  . Warfarin And Related Other (See Comments)    'burns my skin'  . Nitroglycerin Other (See Comments)    Hypotension    Social History: The patient  reports that she quit smoking about 26 years ago. Her smoking use included Cigarettes. She has a 3 pack-year smoking history. She has never used smokeless tobacco. She reports that she does not drink alcohol or use illicit drugs.   Family History: The patient's family history includes Anemia in her mother; Cancer in her paternal grandfather; Diabetes in her father and paternal grandfather; Heart disease in her father and maternal grandfather; Hyperlipidemia in her maternal grandfather; Stroke in her father; Thyroid disease in her mother. There is no history of Coronary artery disease, Sudden death, or Heart attack.   Review of Systems: Please see  the history of present illness.    Otherwise, the review of systems is quite positive for weight changes, chills, cough, DOE, diarrhea, depression, back pain, dizziness, irregular heart beats, N/V, anxiety and headaches.   All other systems are reviewed and negative.   Physical Exam: VS:  BP 94/60 mmHg  Pulse 75  Ht 5' (1.524 m)  Wt 234 lb (106.142 kg)  BMI 45.70 kg/m2 .  BMI Body mass index is 45.7 kg/(m^2).  Wt Readings from Last 3 Encounters:  12/26/14 234 lb (106.142 kg)  12/17/14 235 lb 10.8 oz (106.9 kg)  11/21/14 233 lb (105.688 kg)    General: Pleasant. Well developed, well nourished and in no acute distress. She is obese.  HEENT: Normal. Neck: Supple, no JVD, carotid bruits, or masses noted.  Cardiac: Regular rate and rhythm. No murmurs, rubs, or gallops. Trace edema.  Respiratory:  Lungs are clear to auscultation bilaterally with normal work of breathing.  GI: Soft and nontender.  MS: No deformity or atrophy. Gait and ROM intact. Skin: Warm and dry. Color is normal.  Neuro:  Strength and sensation are intact and no gross focal deficits noted.  Psych: Alert, appropriate and with normal affect.   LABORATORY DATA:  EKG:  EKG is ordered today. This demonstrates NSR with LBBB with A pacing.  Lab Results  Component Value Date   WBC 6.0 12/12/2014   HGB 14.4 12/12/2014   HCT 43.2 12/12/2014   PLT 268 12/12/2014   GLUCOSE 106* 12/13/2014   CHOL 192 08/16/2013   TRIG 152.0* 08/16/2013   HDL 49.10 08/16/2013   LDLCALC 113* 08/16/2013   ALT 45* 12/12/2014   AST 45* 12/12/2014   NA 140 12/13/2014   K 3.9 12/14/2014   CL 108 12/13/2014   CREATININE 0.84 12/13/2014   BUN 10 12/13/2014   CO2 26 12/13/2014   TSH 6.312* 12/12/2014   INR 1.07 08/31/2014   HGBA1C 5.3 09/28/2013    BNP (last 3 results)  Recent Labs  10/11/14 1516 11/16/14 1130 12/12/14 2045  BNP 130.6* 186.1* 431.7*    ProBNP (last 3 results)  Recent Labs  08/31/14 1756  PROBNP 723.7*     Other Studies Reviewed  Today:  Direct Current Cardioversion 3.15.2015  [facility]: Anesthesia propofol 70 mg  Flutter with V pacing confirmed by device with [employer] DCC x 1 120 J bipasic NSR rate 80  No immediate neurologic sequelae  Assessment/Plan: 1. Atrial fib/flutter - back on amiodarone - has not tolerated in the past - Dr. Ladona Ridgel apparently offered a strategy of rate control. Seems to be doing ok so far and looks to be in sinus. Will get her device checked. See him back in about a month. I have left her on her current dose of amiodarone.   2. Underlying ICD -  Her device has had recent reprogramming -  Rechecking today  3. HOCM with past myomectomy  4. Chronic combined systolic and diastolic HF - continue with current plan of care  5. Abnormal labs with increased LFTs and elevated TSh - needs rechecking - will get this today.   Ok to return to cardiac rehab - has more issues with where the [facility] bus drops her off. Ok to resume when she is willing.   Current medicines are reviewed with the patient today.  The patient does not have concerns regarding medicines other than what has been noted above.  The following changes have been made:  See above.  Labs/ tests ordered today  include:    Orders Placed This Encounter  Procedures  . Basic metabolic panel  . Magnesium  . Hepatic function panel  . TSH  . EKG 12-Lead     Disposition:   FU with Dr. Excell Seltzer in May and see Dr.Taylor in one month.  Patient is agreeable to this plan and will call if any problems develop in the interim.   Signed: Rosalio Macadamia, RN, ANP-C 12/26/2014 9:28 AM  [facility] [address] [place], [place]  [address] Phone: [phone] Fax: [phone]

## 2014-12-26 NOTE — Progress Notes (Signed)
ICD check in clinic. Normal device function. Thresholds and sensing consistent with previous device measurements. Impedance trends stable over time. Pt in AF < 1% of time. + Eliquis. 2 nst episodes. Pt just recently in hospital on 12-12-14 for syncopal episode. Pt restarted Amiodorone. Histogram distribution appropriate for patient and level of activity. No changes made this session. Device programmed at appropriate safety margins. Device programmed to optimize intrinsic conduction. Estimated longevity 5.5 years. Pt enrolled in remote follow-up. ROV in June with GT.

## 2014-12-28 ENCOUNTER — Telehealth: Payer: Self-pay | Admitting: Family Medicine

## 2014-12-28 ENCOUNTER — Telehealth: Payer: Self-pay | Admitting: Internal Medicine

## 2014-12-28 ENCOUNTER — Encounter (HOSPITAL_COMMUNITY): Payer: BLUE CROSS/BLUE SHIELD

## 2014-12-28 DIAGNOSIS — R739 Hyperglycemia, unspecified: Secondary | ICD-10-CM

## 2014-12-28 NOTE — Telephone Encounter (Signed)
Patient called to let you know Dr.Taylor will be sending labs over and she wanted you to check her sugar.  Patient said she didn't need an appointment.

## 2014-12-28 NOTE — Addendum Note (Signed)
Addended by: Eustaquio BoydenGUTIERREZ, Adhya Cocco on: 12/28/2014 02:23 PM   Modules accepted: Orders

## 2014-12-28 NOTE — Telephone Encounter (Signed)
Patient was NOT fasting at time of blood draw. No lab appt scheduled.

## 2014-12-28 NOTE — Telephone Encounter (Signed)
Patient called back for lab results. Per Norma FredricksonLori Gerhardt NP, labs look okay, thyroid level is now okay, and liver tests good. Patient was concern about her blood glucose being high. Will send results to PCP to follow up with patient's questions and labs for diabetes. Patient verbalized understanding.

## 2014-12-28 NOTE — Telephone Encounter (Signed)
[facility]

## 2014-12-28 NOTE — Telephone Encounter (Signed)
I reviewed labs - sugar was high for a fasting reading - was she fasting when this was checked? If fasting would suggest she come in for lab visit to recheck elevated sugar and A1c.

## 2014-12-29 ENCOUNTER — Telehealth: Payer: Self-pay | Admitting: Internal Medicine

## 2014-12-29 ENCOUNTER — Encounter: Payer: Self-pay | Admitting: *Deleted

## 2014-12-29 NOTE — Telephone Encounter (Signed)
She does not snore but her O2 went low every night when she went into a deep sleep and she ws put on O2  She will discuss with him on 01/27/15 at her follow up

## 2014-12-29 NOTE — Telephone Encounter (Signed)
New message      Pt said she was told by the nurse in the hosp that she may have sleep apnea.  She want Dr Ladona Ridgelaylor to know this

## 2014-12-30 ENCOUNTER — Telehealth: Payer: Self-pay | Admitting: Internal Medicine

## 2014-12-30 ENCOUNTER — Encounter (HOSPITAL_COMMUNITY): Payer: BLUE CROSS/BLUE SHIELD

## 2014-12-30 ENCOUNTER — Other Ambulatory Visit: Payer: Self-pay | Admitting: Cardiovascular Disease

## 2014-12-30 NOTE — Telephone Encounter (Signed)
Patient inquiring if she has "fluttering or skipped beats" on her transmission. She reports mild dizziness since yesterday afternoon. I informed her that I do see occasional PVCs on her transmission. She seem relieved. She cannot come to the office today, instructed to call back if symptoms worsen or call 911 if needed. Patient reports understanding.

## 2014-12-30 NOTE — Telephone Encounter (Signed)
New message ° ° ° ° ° °1. Has your device fired? no °2. Is you device beeping? no ° °3. Are you experiencing draining or swelling at device site? no ° °4. Are you calling to see if we received your device transmission?yes °5. Have you passed out? no °

## 2015-01-02 ENCOUNTER — Encounter (HOSPITAL_COMMUNITY): Payer: BLUE CROSS/BLUE SHIELD

## 2015-01-02 ENCOUNTER — Encounter (HOSPITAL_COMMUNITY)
Admission: RE | Admit: 2015-01-02 | Payer: BLUE CROSS/BLUE SHIELD | Source: Ambulatory Visit | Attending: Internal Medicine | Admitting: Internal Medicine

## 2015-01-02 ENCOUNTER — Telehealth: Payer: Self-pay | Admitting: Internal Medicine

## 2015-01-02 MED ORDER — ONDANSETRON HCL 4 MG PO TABS: 4.0000 mg | ORAL_TABLET | Freq: Four times a day (QID) | ORAL | Status: DC | PRN

## 2015-01-02 NOTE — Telephone Encounter (Addendum)
Pt calling re changing dose of Amiodarone, needs refill of zolfran, and needs a copy of her last visit

## 2015-01-02 NOTE — Telephone Encounter (Signed)
Spoke with patient and she is still taking the Zofran every 6 hours with the Amiodarone and it is helping.  She is going to divide her Amio doses to am and pm to see if this helps with the nausea

## 2015-01-04 ENCOUNTER — Encounter (HOSPITAL_COMMUNITY): Payer: BLUE CROSS/BLUE SHIELD

## 2015-01-05 ENCOUNTER — Telehealth: Payer: Self-pay | Admitting: Internal Medicine

## 2015-01-05 NOTE — Telephone Encounter (Signed)
New message     Pt c/o medication issue:  1. Name of Medication :ranexa 2. How are you currently taking this medication (dosage and times per day)? 100mg  3. Are you having a reaction (difficulty breathing--STAT)? no  4. What is your medication issue? Someone need to call and do a pier to pier on her medication.  She is out of medication.  Pt took her last pill yesterday morning

## 2015-01-05 NOTE — Telephone Encounter (Signed)
I left the pt a voicemail that we are working with her insurance already to obtain authorization for Ranexa.  I have placed a 3 week supply of samples at the front desk for the pt to pick-up.

## 2015-01-06 ENCOUNTER — Telehealth: Payer: Self-pay | Admitting: *Deleted

## 2015-01-06 ENCOUNTER — Encounter (HOSPITAL_COMMUNITY): Payer: BLUE CROSS/BLUE SHIELD

## 2015-01-06 NOTE — Telephone Encounter (Signed)
e[place] to [employer] on 01/06/15 - (Key: [id number]) - [id number] Ranexa 1000MG  er tablets

## 2015-01-08 ENCOUNTER — Other Ambulatory Visit: Payer: Self-pay | Admitting: Family Medicine

## 2015-01-08 ENCOUNTER — Other Ambulatory Visit: Payer: Self-pay | Admitting: Physician Assistant

## 2015-01-09 ENCOUNTER — Encounter (HOSPITAL_COMMUNITY): Payer: BLUE CROSS/BLUE SHIELD

## 2015-01-09 ENCOUNTER — Other Ambulatory Visit: Payer: Self-pay | Admitting: Neurology

## 2015-01-09 NOTE — Telephone Encounter (Signed)
Ok to refill 

## 2015-01-09 NOTE — Telephone Encounter (Signed)
Rx called in as directed.   

## 2015-01-09 NOTE — Telephone Encounter (Signed)
Plz phone in

## 2015-01-11 ENCOUNTER — Encounter (HOSPITAL_COMMUNITY): Payer: BLUE CROSS/BLUE SHIELD

## 2015-01-13 ENCOUNTER — Encounter (HOSPITAL_COMMUNITY): Payer: BLUE CROSS/BLUE SHIELD

## 2015-01-16 ENCOUNTER — Encounter (HOSPITAL_COMMUNITY): Payer: BLUE CROSS/BLUE SHIELD

## 2015-01-18 ENCOUNTER — Encounter (HOSPITAL_COMMUNITY): Payer: BLUE CROSS/BLUE SHIELD

## 2015-01-20 ENCOUNTER — Encounter (HOSPITAL_COMMUNITY): Payer: BLUE CROSS/BLUE SHIELD

## 2015-01-23 ENCOUNTER — Encounter (HOSPITAL_COMMUNITY): Payer: BLUE CROSS/BLUE SHIELD | Attending: Internal Medicine

## 2015-01-23 ENCOUNTER — Encounter (HOSPITAL_COMMUNITY): Payer: BLUE CROSS/BLUE SHIELD

## 2015-01-23 DIAGNOSIS — E669 Obesity, unspecified: Secondary | ICD-10-CM | POA: Insufficient documentation

## 2015-01-23 DIAGNOSIS — J45909 Unspecified asthma, uncomplicated: Secondary | ICD-10-CM | POA: Insufficient documentation

## 2015-01-23 DIAGNOSIS — J449 Chronic obstructive pulmonary disease, unspecified: Secondary | ICD-10-CM | POA: Insufficient documentation

## 2015-01-23 DIAGNOSIS — Z5189 Encounter for other specified aftercare: Secondary | ICD-10-CM | POA: Insufficient documentation

## 2015-01-23 DIAGNOSIS — I10 Essential (primary) hypertension: Secondary | ICD-10-CM | POA: Insufficient documentation

## 2015-01-23 DIAGNOSIS — Z95810 Presence of automatic (implantable) cardiac defibrillator: Secondary | ICD-10-CM | POA: Insufficient documentation

## 2015-01-23 DIAGNOSIS — I509 Heart failure, unspecified: Secondary | ICD-10-CM | POA: Insufficient documentation

## 2015-01-23 DIAGNOSIS — E118 Type 2 diabetes mellitus with unspecified complications: Secondary | ICD-10-CM | POA: Insufficient documentation

## 2015-01-23 DIAGNOSIS — Z8673 Personal history of transient ischemic attack (TIA), and cerebral infarction without residual deficits: Secondary | ICD-10-CM | POA: Insufficient documentation

## 2015-01-25 ENCOUNTER — Encounter (HOSPITAL_COMMUNITY): Payer: BLUE CROSS/BLUE SHIELD

## 2015-01-25 ENCOUNTER — Encounter: Payer: Self-pay | Admitting: Internal Medicine

## 2015-01-25 ENCOUNTER — Other Ambulatory Visit: Payer: Self-pay

## 2015-01-26 ENCOUNTER — Other Ambulatory Visit: Payer: Self-pay | Admitting: Physician Assistant

## 2015-01-27 ENCOUNTER — Encounter (HOSPITAL_COMMUNITY): Payer: BLUE CROSS/BLUE SHIELD

## 2015-01-27 ENCOUNTER — Ambulatory Visit (INDEPENDENT_AMBULATORY_CARE_PROVIDER_SITE_OTHER): Payer: BLUE CROSS/BLUE SHIELD | Admitting: Internal Medicine

## 2015-01-27 ENCOUNTER — Encounter: Payer: Self-pay | Admitting: Internal Medicine

## 2015-01-27 VITALS — BP 92/62 | HR 75 | Ht 60.0 in | Wt 233.6 lb

## 2015-01-27 DIAGNOSIS — I5042 Chronic combined systolic (congestive) and diastolic (congestive) heart failure: Secondary | ICD-10-CM

## 2015-01-27 DIAGNOSIS — I4891 Unspecified atrial fibrillation: Secondary | ICD-10-CM | POA: Diagnosis not present

## 2015-01-27 DIAGNOSIS — Z95810 Presence of automatic (implantable) cardiac defibrillator: Secondary | ICD-10-CM | POA: Diagnosis not present

## 2015-01-27 DIAGNOSIS — I472 Ventricular tachycardia, unspecified: Secondary | ICD-10-CM

## 2015-01-27 DIAGNOSIS — I422 Other hypertrophic cardiomyopathy: Secondary | ICD-10-CM | POA: Diagnosis not present

## 2015-01-27 DIAGNOSIS — I4719 Other supraventricular tachycardia: Secondary | ICD-10-CM

## 2015-01-27 DIAGNOSIS — I471 Supraventricular tachycardia: Secondary | ICD-10-CM

## 2015-01-27 LAB — MDC_IDC_ENUM_SESS_TYPE_INCLINIC
Brady Statistic RA Percent Paced: 100 %
HIGH POWER IMPEDANCE MEASURED VALUE: 54 Ohm
Implantable Pulse Generator Serial Number: 102591
Lead Channel Impedance Value: 484 Ohm
Lead Channel Pacing Threshold Amplitude: 1 V
Lead Channel Pacing Threshold Pulse Width: 0.5 ms
Lead Channel Sensing Intrinsic Amplitude: 13.5 mV
Lead Channel Sensing Intrinsic Amplitude: 5.8 mV
Lead Channel Setting Pacing Amplitude: 2.4 V
Lead Channel Setting Sensing Sensitivity: 0.6 mV
MDC IDC MSMT LEADCHNL RA IMPEDANCE VALUE: 592 Ohm
MDC IDC MSMT LEADCHNL RA PACING THRESHOLD PULSEWIDTH: 0.5 ms
MDC IDC MSMT LEADCHNL RV PACING THRESHOLD AMPLITUDE: 1.2 V
MDC IDC SET LEADCHNL RA PACING AMPLITUDE: 2 V
MDC IDC SET LEADCHNL RV PACING PULSEWIDTH: 0.5 ms
MDC IDC STAT BRADY RV PERCENT PACED: 11 %
Zone Setting Detection Interval: 250 ms
Zone Setting Detection Interval: 286 ms

## 2015-01-27 NOTE — Patient Instructions (Signed)
Medication Instructions:  Your physician recommends that you continue on your current medications as directed. Please refer to the Current Medication list given to you today.   Labwork: NONE  Testing/Procedures: NONE  Follow-Up: Your physician recommends that you schedule a follow-up appointment in: 2 months with Dr. Ladona Ridgelaylor   Any Other Special Instructions Will Be Listed Below (If Applicable).

## 2015-01-28 ENCOUNTER — Other Ambulatory Visit: Payer: Self-pay | Admitting: Nurse Practitioner

## 2015-01-28 MED ORDER — METOPROLOL SUCCINATE ER 25 MG PO TB24: 75.0000 mg | ORAL_TABLET | Freq: Two times a day (BID) | ORAL | Status: DC

## 2015-01-29 NOTE — Assessment & Plan Note (Signed)
She is s/p ablation of atrial flutter and atrial tachycardia is controlled on amiodarone.

## 2015-01-29 NOTE — Progress Notes (Signed)
HPI [name] returns today for followup. She is a pleasant 44 yo woman with multiple medical problems including HCM, s/p myomectomy, VT, chronic systolic heart failure, LBBB, and HTN. The patient has in the past had severe nausea with amiodarone. She has been on multiple AA drugs including Mexilitine and Ranexa. She has had recurrent VT/VF as well as atrial tachy and flutter. She asked to retry initiation of amiodarone used in conjunction with Zofran. She has been on this medication for the past month and her VT has been quiet. She still gets nauseated but it appears to be improving.  Allergies  Allergen Reactions  . Amiodarone Nausea And Vomiting    Amiodarone tablets cause nausea (unless taking Zofran) Can tolerate IV Amiodarone  . Phenergan [Promethazine Hcl] Itching  . Warfarin And Related Other (See Comments)    'burns my skin'  . Nitroglycerin Other (See Comments)    Hypotension     Current Outpatient Prescriptions  Medication Sig Dispense Refill  . acetaminophen (TYLENOL) 500 MG tablet Take 500-1,000 mg by mouth 2 (two) times daily as needed for mild pain, moderate pain, fever or headache (pain).     Marland Kitchen. albuterol (PROVENTIL HFA;VENTOLIN HFA) 108 (90 BASE) MCG/ACT inhaler Inhale 1 puff into the lungs every 4 (four) hours as needed for wheezing or shortness of breath.     . ALPRAZolam (XANAX) 1 MG tablet TAKE ONE TABLET BY MOUTH THREE TIMES DAILY AS NEEDED (Patient taking differently: TAKE ONE TABLET BY MOUTH THREE TIMES DAILY AS NEEDED FOR ANXIETY) 70 tablet 0  . amiodarone (PACERONE) 200 MG tablet Take 200 mg by mouth 2 (two) times daily.    Marland Kitchen. apixaban (ELIQUIS) 5 MG TABS tablet Take 1 tablet (5 mg total) by mouth 2 (two) times daily. 180 tablet 3  . beclomethasone (QVAR) 80 MCG/ACT inhaler Inhale 1 puff into the lungs 2 (two) times daily. 9am and 8pm    . cetirizine (ZYRTEC) 10 MG tablet Take 10 mg by mouth daily.     Marland Kitchen. docusate sodium (COLACE) 100 MG capsule Take 100 mg  by mouth 2 (two) times daily as needed for mild constipation.    . furosemide (LASIX) 40 MG tablet Take 40 mg by mouth daily. Pt sometimes takes 80 mg by mouth daily for swelling issues (Takes an extra potassium when she does this)    . gabapentin (NEURONTIN) 300 MG capsule TAKE TWO CAPSULES BY MOUTH THREE TIMES DAILY 180 capsule 0  . levonorgestrel (MIRENA) 20 MCG/24HR IUD 1 each by Intrauterine route once.    Marland Kitchen. levothyroxine (SYNTHROID, LEVOTHROID) 50 MCG tablet TAKE ONE TABLET BY MOUTH ONCE DAILY BEFORE BREAKFAST 30 tablet 3  . Magnesium Gluconate 250 MG TABS Take 500 mg by mouth 2 (two) times daily.    Marland Kitchen. mexiletine (MEXITIL) 150 MG capsule Take 2 capsules (300 mg total) by mouth 3 (three) times daily. 540 capsule 3  . Olopatadine HCl (PATANASE) 0.6 % SOLN Place 1 puff into the nose daily as needed (for allergies).     . ondansetron (ZOFRAN) 4 MG tablet Take 1 tablet (4 mg total) by mouth every 6 (six) hours as needed for nausea or vomiting. 30 tablet 6  . oxyCODONE-acetaminophen (PERCOCET) 7.5-325 MG per tablet Take 1 tablet by mouth every 4 (four) hours as needed (pain).   0  . Potassium Gluconate 595 MG CAPS Take 1 capsule (595 mg total) by mouth daily. OK to take extra on days you need extra lasix  100 capsule 3  . ranolazine (RANEXA) 1000 MG SR tablet Take 1 tablet (1,000 mg total) by mouth 2 (two) times daily. 180 tablet 3  . sertraline (ZOLOFT) 50 MG tablet TAKE ONE TABLET BY MOUTH ONCE DAILY 30 tablet 3  . furosemide (LASIX) 40 MG tablet TAKE ONE TABLET BY MOUTH ONCE DAILY 30 tablet 5  . metoprolol succinate (TOPROL-XL) 25 MG 24 hr tablet Take 3 tablets (75 mg total) by mouth 2 (two) times daily. 180 tablet 11   No current facility-administered medications for this visit.     Past Medical History  Diagnosis Date  . Hypertrophic cardiomyopathy     s/p septal myomectomy with implantable defibrillator 01/27/2012   . Anxiety state, unspecified   . Allergic rhinitis     to dogs  .  Asthma   . Anemia     during pregnancy  . History of chicken pox   . Generalized headaches   . Ocular migraine     no HA  . Back pain     told cracked bone, vicodin didn't help, improved with percocets/muscle relaxants, no eval by prior PCP  . 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 DVT (deep venous thrombosis) 01/2012    was placed on Warfarin after cardiac surgery but had skin reaction to warfarin so was changed to Pradaxa so DVT occurred while on Pradaxa although unclear if it occurred during transition  . Hypotension   . Atrial fibrillation     a. On pradaxa.  coumadin necrosis with warfarin;  b. Amio d/c'd 02/2012  . Pericardial effusion   . Chronic combined systolic and diastolic CHF (congestive heart failure)     a. echo 03/19/12: severe asymmetric septal hypertrophy, evidence of upper septal myomectomy, peak LV outflow tract gradient 35, EF 35%,;  b.  Echo 9/13: Severe LVH, EF 30-35%, anteroseptal and inferior HK, LVOT narrow, mild AI, mild to moderate mitral stenosis, severe LAE  . Pericardial tamponade 03/14/2012  . 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 01/2012 s/p BSX ICD;  b. 06/2012 Multaq initiated due to recurrent syncope, VT/VF - had severe n/v with IV amio.  . Skin necrosis --COUMADIN   . Chronic chest wall pain     eval by CVTS, stable, rec return to [facility] if continued pain  . Syncope     a. in setting of VT/VF.  Marland Kitchen Hypothyroid   . Blood transfusion without reported diagnosis   . Neuromuscular disorder   . Gall stones   . Atrial flutter     a. 10/2013 with inappropriate ICD therapy; b. s/p CTI ablation by Dr Ladona Ridgel 11/16/2013;  c. 11/2013 syncope 2/2 aflutter/VT->failed DCCV->amio resumed w/ zofran for nausea.  Marland Kitchen PONV (postoperative nausea and vomiting)   . Depression   . Post-traumatic stress   . Fibromyalgia   . Hx of echocardiogram     Echo (2/16): mod LVH, EF 55-60%, no RWMA, Gr 2 DD, mild AI, mild to mod MS,  mod LAE    ROS:   All systems reviewed and negative except as noted in the HPI.   Past Surgical History  Procedure Laterality Date  . Cesarean section  1991  . Induced abortion  1990 and 1993  . Cardiac surgery  01/27/2012    median sternotomy, septal myectomy, MVR - [facility] (Dr. Silvestre Mesi)  . Cardiac defibrillator placement  01/2012    BSX dual chamber ICD  . Myomectomy    .  Pericardial window  03/14/2012    Procedure: PERICARDIAL WINDOW;  Surgeon: Purcell Nails, MD;  Location: [facility] OR;  Service: Open Heart Surgery;  Laterality: N/A;  Subxyphoid Pericardial  Window   . Ablation  11-16-13    RFCA of atrial flutter by Dr Ladona Ridgel   . Insert / replace / remove pacemaker    . Mitral valve repair    . Cholecystectomy N/A 01/26/2014    LAPAROSCOPIC CHOLECYSTECTOMY WITH INTRAOPERATIVE CHOLANGIOGRAM;  Surgeon: Wilmon Arms. Tsuei, MD  . Atrial flutter ablation N/A 11/16/2013    Procedure: ATRIAL FLUTTER ABLATION;  Surgeon: Marinus Maw, MD;  Location: [facility] CATH LAB;  Service: Cardiovascular;  Laterality: N/A;  . Cardioversion N/A 12/13/2014    Procedure: CARDIOVERSION;  Surgeon: Wendall Stade, MD;  Location: [facility] ENDOSCOPY;  Service: Cardiovascular;  Laterality: N/A;     Family History  Problem Relation Age of Onset  . Diabetes Father   . Heart disease Father     unspecified heart problem  . Thyroid disease Mother   . Anemia Mother     mediterranean anemia, ITP  . Heart disease Maternal Grandfather     CHF  . Hyperlipidemia Maternal Grandfather   . Diabetes Paternal Grandfather   . Cancer Paternal Grandfather   . Coronary artery disease Neg Hx   . Stroke Father   . Sudden death Neg Hx   . Heart attack Neg Hx      History   Social History  . Marital Status: Single    Spouse Name: N/A  . Number of Children: 1  . Years of Education: N/A   Occupational History  . Naval architect    Social History Main Topics  . Smoking status: Former Smoker -- 1.00 packs/day for 3 years     Types: Cigarettes    Quit date: 09/30/1988  . Smokeless tobacco: Never Used     Comment: quitin 1990  . Alcohol Use: No     Comment: none since "months" as of 02/28/2013  . Drug Use: No     Comment: quiti n 1989  . Sexual Activity: Yes    Birth Control/ Protection: Pill   Other Topics Concern  . Not on file   Social History Narrative   Caffeine: 1 cup coffee/day   Divorced, 1 child.  lives with boyfriend, dogs   Occupation: Full time Tax adviser   Activity: walks 41min/day   Diet: fruits/vegetables daily, water, no fish     BP 92/62 mmHg  Pulse 75  Ht 5' (1.524 m)  Wt 105.96 kg (233 lb 9.6 oz)  BMI 45.62 kg/m2  Physical Exam:  stable appearing obese, middle aged woman, NAD HEENT: Unremarkable Neck:  7 cm JVD  Back:  No CVA tenderness Lungs:  Clear with no wheezes HEART:  Regular rate rhythm, no murmurs, no rubs, no clicks Abd:  soft, positive bowel sounds, no organomegally, no rebound, no guarding Ext:  2 plus pulses, trace peripheral edema, no cyanosis, no clubbing Skin:  No rashes no nodules Neuro:  CN II through XII intact, motor grossly intact   DEVICE  Normal device function.  See PaceArt for details.   Assess/Plan:

## 2015-01-29 NOTE — Assessment & Plan Note (Signed)
She appears to be maintaining NSR on amiodarone and her nausea is ok. She will continue her amiodarone. Her nausea is controlled with IV Zofran.

## 2015-01-29 NOTE — Assessment & Plan Note (Signed)
She is maintaining NSR on amio. She will continue her anti-coagulation.

## 2015-01-29 NOTE — Assessment & Plan Note (Signed)
Her [place] ICD is working normally. Will recheck in several months.  

## 2015-01-30 ENCOUNTER — Encounter (HOSPITAL_COMMUNITY): Payer: BLUE CROSS/BLUE SHIELD

## 2015-02-01 ENCOUNTER — Encounter (HOSPITAL_COMMUNITY): Payer: BLUE CROSS/BLUE SHIELD

## 2015-02-03 ENCOUNTER — Encounter (HOSPITAL_COMMUNITY): Payer: BLUE CROSS/BLUE SHIELD

## 2015-02-06 ENCOUNTER — Encounter (HOSPITAL_COMMUNITY): Payer: BLUE CROSS/BLUE SHIELD

## 2015-02-07 ENCOUNTER — Encounter: Payer: BLUE CROSS/BLUE SHIELD | Admitting: Cardiovascular Disease

## 2015-02-07 NOTE — Progress Notes (Signed)
This encounter was created in error - please disregard.

## 2015-02-08 ENCOUNTER — Encounter (HOSPITAL_COMMUNITY): Payer: BLUE CROSS/BLUE SHIELD

## 2015-02-10 ENCOUNTER — Encounter (HOSPITAL_COMMUNITY): Payer: BLUE CROSS/BLUE SHIELD

## 2015-02-12 ENCOUNTER — Other Ambulatory Visit: Payer: Self-pay | Admitting: Internal Medicine

## 2015-02-12 ENCOUNTER — Other Ambulatory Visit: Payer: Self-pay | Admitting: Neurology

## 2015-02-12 ENCOUNTER — Other Ambulatory Visit: Payer: Self-pay | Admitting: Family Medicine

## 2015-02-13 ENCOUNTER — Encounter (HOSPITAL_COMMUNITY): Payer: BLUE CROSS/BLUE SHIELD

## 2015-02-13 ENCOUNTER — Other Ambulatory Visit: Payer: Self-pay

## 2015-02-13 ENCOUNTER — Telehealth (HOSPITAL_COMMUNITY): Payer: Self-pay | Admitting: Cardiac Rehabilitation

## 2015-02-13 MED ORDER — MEXILETINE HCL 150 MG PO CAPS: 300.0000 mg | ORAL_CAPSULE | Freq: Three times a day (TID) | ORAL | Status: DC

## 2015-02-13 NOTE — Telephone Encounter (Signed)
plz phone in. 

## 2015-02-13 NOTE — Telephone Encounter (Signed)
pc to pt to assess readiness to return to cardiac rehab. Pt states per Dr. Ladona Ridgelaylor she should wait 3 months to resume cardiac rehab, to allow for full amiodarone effectiveness.  Pt agrees with this recommendation.  Pt will call cardiac rehab to reschedule when she is ready.

## 2015-02-14 ENCOUNTER — Telehealth: Payer: Self-pay | Admitting: *Deleted

## 2015-02-14 MED ORDER — BECLOMETHASONE DIPROPIONATE 80 MCG/ACT IN AERS: 1.0000 | INHALATION_SPRAY | Freq: Two times a day (BID) | RESPIRATORY_TRACT | Status: DC

## 2015-02-14 NOTE — Telephone Encounter (Signed)
Rx called to pharmacy as instructed. 

## 2015-02-15 ENCOUNTER — Encounter (HOSPITAL_COMMUNITY): Payer: BLUE CROSS/BLUE SHIELD

## 2015-02-17 ENCOUNTER — Encounter (HOSPITAL_COMMUNITY): Payer: BLUE CROSS/BLUE SHIELD

## 2015-02-20 ENCOUNTER — Encounter (HOSPITAL_COMMUNITY): Payer: BLUE CROSS/BLUE SHIELD

## 2015-02-21 NOTE — Telephone Encounter (Signed)
PA required for QVAR. Has been denied. Will need substitute.

## 2015-02-22 ENCOUNTER — Encounter (HOSPITAL_COMMUNITY): Payer: BLUE CROSS/BLUE SHIELD

## 2015-02-24 ENCOUNTER — Encounter (HOSPITAL_COMMUNITY): Payer: BLUE CROSS/BLUE SHIELD

## 2015-03-01 ENCOUNTER — Telehealth: Payer: Self-pay | Admitting: Internal Medicine

## 2015-03-01 NOTE — Telephone Encounter (Signed)
New Message       Pt calling wanting to ask about a weight loss program because she has been told that she needs to lose weight. Pt is looking into ACG injections but states she was told that she would need a letter from her doctor stating that she is not on Eliquis for a blood clot. Please call back and advise.

## 2015-03-01 NOTE — Telephone Encounter (Signed)
Discussed with Dr Ladona Ridgelaylor and he says to risky with her health issues  I called the patient and we discussed ways to loose weight and count calories.  She is going to try and do this.  I let her know that Dr Ladona Ridgelaylor would not advise for HCG injections

## 2015-03-01 NOTE — Telephone Encounter (Signed)
[facility] - Client TELEPHONE ADVICE RECORD [facility] Patient Name: Karen DienerCINDY [name] DOB: 08/18/1971 Initial Comment Caller states has been told she needed to loose weight. Does HCG cause blood clots. Nurse Assessment Nurse: Orvis Brillowland, RN, Olegario MessierKathy Date/Time Lamount Cohen(Eastern Time): 03/01/2015 3:19:07 PM Confirm and document reason for call. If symptomatic, describe symptoms. ---Caller states that she was advised to lose weight & is wanting to know if HCG can cause blood clots. Reports that she had DVT 3 yrs ago after having had surgery & is currently on Eliquis. Has the patient traveled out of the country within the last 30 days? ---No Does the patient require triage? ---No Please document clinical information provided and list any resource used. ---http://www.drugs.com/hcg.html, Call your doctor at once if you have any of these signs of a blood clot: pain, warmth, redness, numbness, or tingling in your arm or leg; confusion, extreme dizziness, or severe headache. Caller advised that database does not state specifically that HCG causes blood clots but that apparently there have been issues with this secondary to qualifier under info about HCG. Guidelines Guideline Title Affirmed Question Affirmed Notes Final Disposition User Clinical Call [place] ValleyRowland, RN, Olegario MessierKathy

## 2015-03-03 ENCOUNTER — Emergency Department (HOSPITAL_COMMUNITY): Payer: BLUE CROSS/BLUE SHIELD

## 2015-03-03 ENCOUNTER — Emergency Department (HOSPITAL_COMMUNITY)
Admission: EM | Admit: 2015-03-03 | Discharge: 2015-03-03 | Disposition: A | Discharge status: Home / Self Care | Payer: BLUE CROSS/BLUE SHIELD | Attending: Emergency Medicine | Admitting: Emergency Medicine

## 2015-03-03 ENCOUNTER — Encounter (HOSPITAL_COMMUNITY): Payer: Self-pay | Admitting: Emergency Medicine

## 2015-03-03 ENCOUNTER — Encounter: Payer: Self-pay | Admitting: Internal Medicine

## 2015-03-03 DIAGNOSIS — Z8619 Personal history of other infectious and parasitic diseases: Secondary | ICD-10-CM | POA: Diagnosis not present

## 2015-03-03 DIAGNOSIS — Y998 Other external cause status: Secondary | ICD-10-CM | POA: Diagnosis not present

## 2015-03-03 DIAGNOSIS — I5042 Chronic combined systolic (congestive) and diastolic (congestive) heart failure: Secondary | ICD-10-CM | POA: Insufficient documentation

## 2015-03-03 DIAGNOSIS — G8929 Other chronic pain: Secondary | ICD-10-CM | POA: Insufficient documentation

## 2015-03-03 DIAGNOSIS — Z86718 Personal history of other venous thrombosis and embolism: Secondary | ICD-10-CM | POA: Diagnosis not present

## 2015-03-03 DIAGNOSIS — Z862 Personal history of diseases of the blood and blood-forming organs and certain disorders involving the immune mechanism: Secondary | ICD-10-CM | POA: Insufficient documentation

## 2015-03-03 DIAGNOSIS — Y9301 Activity, walking, marching and hiking: Secondary | ICD-10-CM | POA: Diagnosis not present

## 2015-03-03 DIAGNOSIS — W1849XA Other slipping, tripping and stumbling without falling, initial encounter: Secondary | ICD-10-CM | POA: Diagnosis not present

## 2015-03-03 DIAGNOSIS — G629 Polyneuropathy, unspecified: Secondary | ICD-10-CM | POA: Diagnosis not present

## 2015-03-03 DIAGNOSIS — I4892 Unspecified atrial flutter: Secondary | ICD-10-CM | POA: Diagnosis not present

## 2015-03-03 DIAGNOSIS — F329 Major depressive disorder, single episode, unspecified: Secondary | ICD-10-CM | POA: Diagnosis not present

## 2015-03-03 DIAGNOSIS — F419 Anxiety disorder, unspecified: Secondary | ICD-10-CM | POA: Diagnosis not present

## 2015-03-03 DIAGNOSIS — M797 Fibromyalgia: Secondary | ICD-10-CM | POA: Insufficient documentation

## 2015-03-03 DIAGNOSIS — Z7951 Long term (current) use of inhaled steroids: Secondary | ICD-10-CM | POA: Insufficient documentation

## 2015-03-03 DIAGNOSIS — S99922A Unspecified injury of left foot, initial encounter: Secondary | ICD-10-CM | POA: Diagnosis present

## 2015-03-03 DIAGNOSIS — F4310 Post-traumatic stress disorder, unspecified: Secondary | ICD-10-CM | POA: Diagnosis not present

## 2015-03-03 DIAGNOSIS — I4891 Unspecified atrial fibrillation: Secondary | ICD-10-CM | POA: Insufficient documentation

## 2015-03-03 DIAGNOSIS — M79672 Pain in left foot: Secondary | ICD-10-CM

## 2015-03-03 DIAGNOSIS — Y9289 Other specified places as the place of occurrence of the external cause: Secondary | ICD-10-CM | POA: Insufficient documentation

## 2015-03-03 DIAGNOSIS — E039 Hypothyroidism, unspecified: Secondary | ICD-10-CM | POA: Diagnosis not present

## 2015-03-03 DIAGNOSIS — J45909 Unspecified asthma, uncomplicated: Secondary | ICD-10-CM | POA: Diagnosis not present

## 2015-03-03 DIAGNOSIS — Z79899 Other long term (current) drug therapy: Secondary | ICD-10-CM | POA: Insufficient documentation

## 2015-03-03 MED ORDER — BUDESONIDE 180 MCG/ACT IN AEPB: 2.0000 | INHALATION_SPRAY | Freq: Two times a day (BID) | RESPIRATORY_TRACT | Status: DC

## 2015-03-03 NOTE — Addendum Note (Signed)
Addended by: Eustaquio BoydenGUTIERREZ, Pantelis Elgersma on: 03/03/2015 07:57 AM   Modules accepted: Orders, Medications

## 2015-03-03 NOTE — Discharge Instructions (Signed)
Please use cam walker and crutches, avoid weight-bearing activities. Prescription pain medication for breakthrough pain, ibuprofen or Tylenol as needed. Please contact orthopedic specialist attached to this discharge instruction, schedule follow-up with appointment as soon as possible. Please return as needed.

## 2015-03-03 NOTE — ED Notes (Addendum)
Pt reports tripping on shoes outside of her door this am and fell to the concrete. Pt has left knee abrasion with pain no deformity, left ankle pain from "twisting". Pt just concerned about her ankle and wants an xray. No LOC. No obvious injury to her head. Pt ambulatory. No numbness or tingling noted.

## 2015-03-03 NOTE — ED Provider Notes (Signed)
CSN: [id number]     Arrival date & time 03/03/15  0904 History   None    Chief Complaint  Patient presents with  . Fall  . Ankle Pain   HPI   44 year old female presents with left foot pain. Patient reports that she was walking down her stairs this morning tripped over a shoe forcing her left foot into plantar flexion with immediate pain to the mid foot. She reports difficulty ambulating immediately after the fall, reports she is able to ambulate without difficulty at this time. She additionally reports an abrasion to her left anterior knee, denies pain to the knee lower extremity or ankle. No soft tissue injury at this time. Patient reports a previous injury to the lateral aspect of left foot, reports she has not tried any at-home therapies.  Past Medical History  Diagnosis Date  . Hypertrophic cardiomyopathy     s/p septal myomectomy with implantable defibrillator 01/27/2012   . Anxiety state, unspecified   . Allergic rhinitis     to dogs  . Asthma   . Anemia     during pregnancy  . History of chicken pox   . Generalized headaches   . Ocular migraine     no HA  . Back pain     told cracked bone, vicodin didn't help, improved with percocets/muscle relaxants, no eval by prior PCP  . 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 DVT (deep venous thrombosis) 01/2012    was placed on Warfarin after cardiac surgery but had skin reaction to warfarin so was changed to Pradaxa so DVT occurred while on Pradaxa although unclear if it occurred during transition  . Hypotension   . Atrial fibrillation     a. On pradaxa.  coumadin necrosis with warfarin;  b. Amio d/c'd 02/2012  . Pericardial effusion   . Chronic combined systolic and diastolic CHF (congestive heart failure)     a. echo 03/19/12: severe asymmetric septal hypertrophy, evidence of upper septal myomectomy, peak LV outflow tract gradient 35, EF 35%,;  b.  Echo 9/13: Severe LVH, EF 30-35%,  anteroseptal and inferior HK, LVOT narrow, mild AI, mild to moderate mitral stenosis, severe LAE  . Pericardial tamponade 03/14/2012  . 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 01/2012 s/p BSX ICD;  b. 06/2012 Multaq initiated due to recurrent syncope, VT/VF - had severe n/v with IV amio.  . Skin necrosis --COUMADIN   . Chronic chest wall pain     eval by CVTS, stable, rec return to [facility] if continued pain  . Syncope     a. in setting of VT/VF.  Marland Kitchen Hypothyroid   . Blood transfusion without reported diagnosis   . Neuromuscular disorder   . Gall stones   . Atrial flutter     a. 10/2013 with inappropriate ICD therapy; b. s/p CTI ablation by Dr Ladona Ridgel 11/16/2013;  c. 11/2013 syncope 2/2 aflutter/VT->failed DCCV->amio resumed w/ zofran for nausea.  Marland Kitchen PONV (postoperative nausea and vomiting)   . Depression   . Post-traumatic stress   . Fibromyalgia   . Hx of echocardiogram     Echo (2/16): mod LVH, EF 55-60%, no RWMA, Gr 2 DD, mild AI, mild to mod MS, mod LAE   Past Surgical History  Procedure Laterality Date  . Cesarean section  1991  . Induced abortion  1990 and 1993  . Cardiac surgery  01/27/2012    median sternotomy,  septal myectomy, MVR - [facility] (Dr. Silvestre MesiGlower)  . Cardiac defibrillator placement  01/2012    BSX dual chamber ICD  . Myomectomy    . Pericardial window  03/14/2012    Procedure: PERICARDIAL WINDOW;  Surgeon: Purcell Nailslarence H Owen, MD;  [facility];  Service: Open Heart Surgery;  Laterality: N/A;  Subxyphoid Pericardial  Window   . Ablation  11-16-13    RFCA of atrial flutter by Dr Ladona Ridgelaylor   . Insert / replace / remove pacemaker    . Mitral valve repair    . Cholecystectomy N/A 01/26/2014    LAPAROSCOPIC CHOLECYSTECTOMY WITH INTRAOPERATIVE CHOLANGIOGRAM;  Surgeon: Wilmon ArmsMatthew K. Tsuei, MD  . Atrial flutter ablation N/A 11/16/2013    Procedure: ATRIAL FLUTTER ABLATION;  Surgeon: Marinus MawGregg W Taylor, MD;  [facility];  Service: Cardiovascular;  Laterality: N/A;  . Cardioversion N/A  12/13/2014    Procedure: CARDIOVERSION;  Surgeon: Wendall StadePeter C Nishan, MD;  [facility];  Service: Cardiovascular;  Laterality: N/A;   Family History  Problem Relation Age of Onset  . Diabetes Father   . Heart disease Father     unspecified heart problem  . Thyroid disease Mother   . Anemia Mother     mediterranean anemia, ITP  . Heart disease Maternal Grandfather     CHF  . Hyperlipidemia Maternal Grandfather   . Diabetes Paternal Grandfather   . Cancer Paternal Grandfather   . Coronary artery disease Neg Hx   . Stroke Father   . Sudden death Neg Hx   . Heart attack Neg Hx    History  Substance Use Topics  . Smoking status: Former Smoker -- 1.00 packs/day for 3 years    Types: Cigarettes    Quit date: 09/30/1988  . Smokeless tobacco: Never Used     Comment: quitin 1990  . Alcohol Use: No     Comment: none since "months" as of 02/28/2013   OB History    No data available     Review of Systems  All other systems reviewed and are negative.  Allergies  Amiodarone; Phenergan; Warfarin and related; and Nitroglycerin  Home Medications   Prior to Admission medications   Medication Sig Start Date End Date Taking? Authorizing Provider  acetaminophen (TYLENOL) 500 MG tablet Take 500-1,000 mg by mouth 2 (two) times daily as needed for mild pain, moderate pain, fever or headache (pain).    Yes Historical Provider, MD  albuterol (PROVENTIL HFA;VENTOLIN HFA) 108 (90 BASE) MCG/ACT inhaler Inhale 1 puff into the lungs 2 (two) times daily as needed for wheezing or shortness of breath.    Yes Historical Provider, MD  ALPRAZolam (XANAX) 1 MG tablet TAKE ONE TABLET BY MOUTH THREE TIMES DAILY AS NEEDED Patient taking differently: TAKE ONE TABLET BY MOUTH THREE TIMES DAILY AS NEEDED for anxiety 02/13/15  Yes Eustaquio BoydenJavier Gutierrez, MD  amiodarone (PACERONE) 200 MG tablet Take 200 mg by mouth 2 (two) times daily.   Yes Historical Provider, MD  apixaban (ELIQUIS) 5 MG TABS tablet Take 1 tablet  (5 mg total) by mouth 2 (two) times daily. 11/03/14  Yes Beatrice LecherScott T Weaver, PA-C  beclomethasone (QVAR) 80 MCG/ACT inhaler Inhale 1 puff into the lungs 2 (two) times daily.   Yes Historical Provider, MD  cetirizine (ZYRTEC) 10 MG tablet Take 10 mg by mouth daily.    Yes Historical Provider, MD  docusate sodium (COLACE) 100 MG capsule Take 100 mg by mouth 2 (two) times daily as needed for mild  constipation.   Yes Historical Provider, MD  furosemide (LASIX) 40 MG tablet Take 40 mg by mouth daily. Pt sometimes takes 80 mg by mouth daily for swelling issues (Takes an extra potassium when she does this)   Yes Historical Provider, MD  gabapentin (NEURONTIN) 300 MG capsule TAKE TWO CAPSULES BY MOUTH THREE TIMES DAILY 02/13/15  Yes Drema Dallas, DO  levonorgestrel (MIRENA) 20 MCG/24HR IUD 1 each by Intrauterine route once.   Yes Historical Provider, MD  levothyroxine (SYNTHROID, LEVOTHROID) 50 MCG tablet TAKE ONE TABLET BY MOUTH ONCE DAILY BEFORE BREAKFAST 12/09/14  Yes Eustaquio Boyden, MD  Magnesium Gluconate 250 MG TABS Take 500 mg by mouth 2 (two) times daily.   Yes Historical Provider, MD  metoprolol succinate (TOPROL-XL) 25 MG 24 hr tablet Take 3 tablets (75 mg total) by mouth 2 (two) times daily. 01/28/15  Yes Ok Anis, NP  mexiletine (MEXITIL) 150 MG capsule Take 2 capsules (300 mg total) by mouth 3 (three) times daily. 02/13/15  Yes Marinus Maw, MD  Olopatadine HCl (PATANASE) 0.6 % SOLN Place 1 puff into the nose daily as needed (for allergies).    Yes Historical Provider, MD  ondansetron (ZOFRAN) 4 MG tablet Take 1 tablet (4 mg total) by mouth every 6 (six) hours as needed for nausea or vomiting. 01/02/15  Yes Marinus Maw, MD  oxyCODONE-acetaminophen (PERCOCET) 7.5-325 MG per tablet Take 1 tablet by mouth every 4 (four) hours as needed (pain).  10/13/14  Yes Historical Provider, MD  Potassium Gluconate 595 MG CAPS Take 1 capsule (595 mg total) by mouth daily. OK to take extra on days you need  extra lasix 11/03/14  Yes Scott T Alben Spittle, PA-C  ranolazine (RANEXA) 1000 MG SR tablet Take 1 tablet (1,000 mg total) by mouth 2 (two) times daily. 11/03/14 11/03/15 Yes Scott T Alben Spittle, PA-C  sertraline (ZOLOFT) 50 MG tablet TAKE ONE TABLET BY MOUTH ONCE DAILY 12/09/14  Yes Eustaquio Boyden, MD  budesonide (PULMICORT FLEXHALER) 180 MCG/ACT inhaler Inhale 2 puffs into the lungs 2 (two) times daily. Patient not taking: Reported on 03/03/2015 03/03/15   Eustaquio Boyden, MD  furosemide (LASIX) 40 MG tablet TAKE ONE TABLET BY MOUTH ONCE DAILY Patient not taking: Reported on 03/03/2015 01/27/15   Marinus Maw, MD  mexiletine (MEXITIL) 150 MG capsule TAKE TWO CAPSULES BY MOUTH THREE TIMES DAILY Patient not taking: Reported on 03/03/2015 02/13/15   Marinus Maw, MD   BP 115/60 mmHg  Pulse 72  Temp(Src) 98.1 F (36.7 C) (Oral)  Resp 16  SpO2 100%   Physical Exam  Constitutional: She is oriented to person, place, and time. She appears well-developed and well-nourished.  HENT:  Head: Normocephalic and atraumatic.  Eyes: Conjunctivae are normal. Pupils are equal, round, and reactive to light. Right eye exhibits no discharge. Left eye exhibits no discharge. No scleral icterus.  Neck: Normal range of motion. No JVD present. No tracheal deviation present.  Pulmonary/Chest: Effort normal. No stridor.  Musculoskeletal:  Pain to palpation of left midfoot, point tender or medial aspect around navicular; no signs of trauma, no swelling no edema, ankle full pain-free range of motion, no tenderness to the distal left lower extremity or knee. Patient is able to ambulate, significant pain with this.  Neurological: She is alert and oriented to person, place, and time. Coordination normal.  Psychiatric: She has a normal mood and affect. Her behavior is normal. Judgment and thought content normal.  Nursing note and vitals reviewed.  ED Course  Procedures (including critical care time) Labs Review Labs Reviewed - No data  to display  Imaging Review Dg Ankle Complete Left  03/03/2015   CLINICAL DATA:  Tripped over shoes and hallucis this morning, now with lateral foot ankle pain.  EXAM: LEFT ANKLE COMPLETE - 3+ VIEW  COMPARISON:  Left foot radiographs -earlier same day  FINDINGS: There is mild soft tissue swelling about the lateral malleolus. There is a well corticated ossicle adjacent to the cranial aspect of the navicular bone. Additionally, there is a less well-defined lucency also involving the cranial aspect of the navicular bone (best seen on the provided lateral radiograph). No additional fractures identified. Joint spaces are preserved. Ankle mortise is preserved. No ankle joint effusion. Tiny plantar calcaneal spur. Minimal enthesopathic change of the Achilles tendon insertion site.  IMPRESSION: Ill-defined lucency involving the superior aspect of the navicular bone may represent a in age-indeterminate avulsive injury. Correlation for point tenderness at this location is recommended.   Electronically Signed   By: Simonne Come M.D.   On: 03/03/2015 10:30   Dg Foot Complete Left  03/03/2015   CLINICAL DATA:  Pain following fall after tripping  EXAM: LEFT FOOT - COMPLETE 3+ VIEW  COMPARISON:  None.  FINDINGS: Frontal, oblique, and lateral views obtained. There is no acute fracture or dislocation. There is evidence of suspected old trauma along the dorsal proximal navicular with a small focus of well corticated calcification in this area. There is no appreciable joint space narrowing. No erosive change.  IMPRESSION: Suspect residua of old trauma along the dorsal proximal navicular. No acute fracture or dislocation. Joint spaces appear intact.   Electronically Signed   By: Bretta Bang III M.D.   On: 03/03/2015 10:34     EKG Interpretation None      MDM   Final diagnoses:  Left foot pain    Imaging: DG left foot  Consults: None  Therapeutics: None  Assessment: Foot pain  Plan: Patient presents with  likely avulsion injury to the navicular bone. Patient reports a history of fracture to the left foot but reports that this was on the lateral aspect. Today's x-ray is indeterminant and recommends clinical correlation with point tenderness. Patient does have point tenderness along the medial aspect of the navicular bone and into the midfoot. Due to patient's weight and mobility status splint will not be placed at this time, she was placed in a cam walker and given crutches with instructions for no weightbearing activity.. She will given a prescription for pain medication, instructed not to bear weight on the foot, watch out for signs of compartment syndrome. Patient was given orthopedic consult information and instructed to contact them today to schedule follow-up evaluation. Patient was given strict return precautions, she verbalizes her understanding and agreement for today's plan and had no further questions concerns.      Eyvonne Mechanic, PA-C 03/03/15 2051  Purvis Sheffield, MD 03/03/15 2352

## 2015-03-03 NOTE — Telephone Encounter (Signed)
Will try pulmicort inhaler. Sent in. plz notify pt.

## 2015-03-03 NOTE — Telephone Encounter (Signed)
Message left notifying patient.

## 2015-03-05 NOTE — Progress Notes (Signed)
Cardiology Office Note   Date:  03/06/2015   ID:  [name], DOB 09-29-1971, MRN [id number]  PCP:  Eustaquio Boyden, MD  Cardiologist:  Dr. Tonny Bollman   Electrophysiologist:  Dr. Lewayne Bunting   Chief Complaint  Patient presents with  . Atrial Arrhythmia     History of Present Illness: [name] is a 44 y.o. female with a hx of HOCM status post septal myomectomy and mitral valve repair at [facility] in 12/2011, chronic combined systolic and diastolic HF.  Postoperative course was complicated by ventricular tachycardia, atrial fibrillation and left upper extremity DVT. AICD was implanted. She had necrotic skin reaction on Coumadin and was transitioned to Pradaxa >> then Eliquis. 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 She ultimately underwent pericardial window by Dr. Cornelius Moras. She has had refractory VT/VF and atrial arrhythmias.  She has failed medical Rx with multiple antiarrhythmics.  She has had multiple episodes of appropriate and inappropriate ICD shocks.    Admitted 10/2013 with ICD shock in the setting of AFlutter >> s/p RFCA. Admitted 12/2013 with atrial tachycardia. Admitted in 08/2014 for ICD shock in the setting of ATach.  Beta blocker was adjusted.    Admitted 3/16 with recurrent AFlutter.  VR was under her detection zone of 240 and she was not shocked.  DCCV >> NSR >> return of AFlutter.  She was started on Amiodarone with return of NSR.    Last seen by Dr. Lewayne Bunting 01/27/15.  She returns for FU.  She is doing well.  Nausea is well controlled with taking Zofran.  She denies chest pain.  DOE is unchanged.  She is NYHA 2b-3.  She sleeps on 2-3 pillows chronically.  No PND.  She does have some night-time cough.  She denies syncope.  Unfortunately, she tripped 2 weeks ago and fractured her L foot.  She sees ortho later today.     Studies/Reports Reviewed Today:  Echo 11/24/14 - Moderateconcentric hypertrophy. EF 55% to 60%.  Wallmotion was normal; Grade 2 diastolic dysfunction). - Aortic valve: There was mild regurgitation. - Mitral valve: Per records patient is s/p MV repair but there appears to be some thickening and calcification of the MV leaflets with diastolic doming of the anterior leaflet. The mean gradient across the valve is 8-34mmHg consistent with moderate Mitral stenosis but the MVA and PHT are consistent with mild mitral stenosis. Calcified annulus. Moderate diffuse thickening of the anterior leaflet. Mild focal calcification of the anterior leaflet (medial segment(s)), with mild involvement of chords, consistent with rheumatic disease. Mobility of the anterior leaflet was mildly restricted. Diastolic leaflet doming was present. The findings are consistent with mild to moderate stenosis. There was trivial regurgitation. - Left atrium: The atrium was moderately dilated.  Echocardiogram (11/2013):   HOCM with prior septal myomectomy, moderate LVH, EF 40-45%, trivial AI, mitral valve s/p repair with a small diastolic gradient, no MS (mean 9 mmHg, peak 16 mmHg), trivial MR, moderate LAE   Past Medical History  Diagnosis Date  . Hypertrophic cardiomyopathy     s/p septal myomectomy with implantable defibrillator 01/27/2012   . Anxiety state, unspecified   . Allergic rhinitis     to dogs  . Asthma   . Anemia     during pregnancy  . History of chicken pox   . Generalized headaches   . Ocular migraine     no HA  . Back pain     told cracked bone,  vicodin didn't help, improved with percocets/muscle relaxants, no eval by prior PCP  . 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 DVT (deep venous thrombosis) 01/2012    was placed on Warfarin after cardiac surgery but had skin reaction to warfarin so was changed to Pradaxa so DVT occurred while on Pradaxa although unclear if it occurred during transition  . Hypotension   . Atrial fibrillation     a.  On pradaxa.  coumadin necrosis with warfarin;  b. Amio d/c'd 02/2012  . Pericardial effusion   . Chronic combined systolic and diastolic CHF (congestive heart failure)     a. echo 03/19/12: severe asymmetric septal hypertrophy, evidence of upper septal myomectomy, peak LV outflow tract gradient 35, EF 35%,;  b.  Echo 9/13: Severe LVH, EF 30-35%, anteroseptal and inferior HK, LVOT narrow, mild AI, mild to moderate mitral stenosis, severe LAE  . Pericardial tamponade 03/14/2012  . 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 01/2012 s/p BSX ICD;  b. 06/2012 Multaq initiated due to recurrent syncope, VT/VF - had severe n/v with IV amio.  . Skin necrosis --COUMADIN   . Chronic chest wall pain     eval by CVTS, stable, rec return to [facility] if continued pain  . Syncope     a. in setting of VT/VF.  Marland Kitchen Hypothyroid   . Blood transfusion without reported diagnosis   . Neuromuscular disorder   . Gall stones   . Atrial flutter     a. 10/2013 with inappropriate ICD therapy; b. s/p CTI ablation by Dr Ladona Ridgel 11/16/2013;  c. 11/2013 syncope 2/2 aflutter/VT->failed DCCV->amio resumed w/ zofran for nausea.  Marland Kitchen PONV (postoperative nausea and vomiting)   . Depression   . Post-traumatic stress   . Fibromyalgia   . Hx of echocardiogram     Echo (2/16): mod LVH, EF 55-60%, no RWMA, Gr 2 DD, mild AI, mild to mod MS, mod LAE    Past Surgical History  Procedure Laterality Date  . Cesarean section  1991  . Induced abortion  1990 and 1993  . Cardiac surgery  01/27/2012    median sternotomy, septal myectomy, MVR - [facility] (Dr. Silvestre Mesi)  . Cardiac defibrillator placement  01/2012    BSX dual chamber ICD  . Myomectomy    . Pericardial window  03/14/2012    Procedure: PERICARDIAL WINDOW;  Surgeon: Purcell Nails, MD;  Location: [facility] OR;  Service: Open Heart Surgery;  Laterality: N/A;  Subxyphoid Pericardial  Window   . Ablation  11-16-13    RFCA of atrial flutter by Dr Ladona Ridgel   . Insert / replace / remove pacemaker    . Mitral  valve repair    . Cholecystectomy N/A 01/26/2014    LAPAROSCOPIC CHOLECYSTECTOMY WITH INTRAOPERATIVE CHOLANGIOGRAM;  Surgeon: Wilmon Arms. Tsuei, MD  . Atrial flutter ablation N/A 11/16/2013    Procedure: ATRIAL FLUTTER ABLATION;  Surgeon: Marinus Maw, MD;  Location: [facility] CATH LAB;  Service: Cardiovascular;  Laterality: N/A;  . Cardioversion N/A 12/13/2014    Procedure: CARDIOVERSION;  Surgeon: Wendall Stade, MD;  Location: [facility] ENDOSCOPY;  Service: Cardiovascular;  Laterality: N/A;     Current Outpatient Prescriptions  Medication Sig Dispense Refill  . acetaminophen (TYLENOL) 500 MG tablet Take 500-1,000 mg by mouth 2 (two) times daily as needed for mild pain, moderate pain, fever or headache (pain).     Marland Kitchen albuterol (PROVENTIL HFA;VENTOLIN HFA) 108 (90 BASE)  MCG/ACT inhaler Inhale 1 puff into the lungs 2 (two) times daily as needed for wheezing or shortness of breath.     . ALPRAZolam (XANAX) 1 MG tablet TAKE ONE TABLET BY MOUTH THREE TIMES DAILY AS NEEDED (Patient taking differently: TAKE ONE TABLET BY MOUTH THREE TIMES DAILY AS NEEDED for anxiety) 70 tablet 0  . amiodarone (PACERONE) 200 MG tablet Take 200 mg by mouth 2 (two) times daily.    Marland Kitchen apixaban (ELIQUIS) 5 MG TABS tablet Take 1 tablet (5 mg total) by mouth 2 (two) times daily. 180 tablet 3  . beclomethasone (QVAR) 80 MCG/ACT inhaler Inhale 1 puff into the lungs 2 (two) times daily.    . budesonide (PULMICORT FLEXHALER) 180 MCG/ACT inhaler Inhale 2 puffs into the lungs 2 (two) times daily. 1 Inhaler 11  . cetirizine (ZYRTEC) 10 MG tablet Take 10 mg by mouth daily.     Marland Kitchen docusate sodium (COLACE) 100 MG capsule Take 100 mg by mouth 2 (two) times daily as needed for mild constipation.    . furosemide (LASIX) 40 MG tablet Take 40 mg by mouth daily. Pt sometimes takes 80 mg by mouth daily for swelling issues (Takes an extra potassium when she does this)    . gabapentin (NEURONTIN) 300 MG capsule TAKE TWO CAPSULES BY MOUTH THREE TIMES DAILY 180  capsule 0  . levonorgestrel (MIRENA) 20 MCG/24HR IUD 1 each by Intrauterine route once.    Marland Kitchen levothyroxine (SYNTHROID, LEVOTHROID) 50 MCG tablet TAKE ONE TABLET BY MOUTH ONCE DAILY BEFORE BREAKFAST 30 tablet 3  . Magnesium Gluconate 250 MG TABS Take 500 mg by mouth 2 (two) times daily.    . metoprolol succinate (TOPROL-XL) 25 MG 24 hr tablet Take 3 tablets (75 mg total) by mouth 2 (two) times daily. 180 tablet 11  . mexiletine (MEXITIL) 150 MG capsule Take 2 capsules (300 mg total) by mouth 3 (three) times daily. 540 capsule 3  . Olopatadine HCl (PATANASE) 0.6 % SOLN Place 1 puff into the nose daily as needed (for allergies).     . ondansetron (ZOFRAN) 4 MG tablet Take 1 tablet (4 mg total) by mouth every 6 (six) hours as needed for nausea or vomiting. 30 tablet 6  . oxyCODONE-acetaminophen (PERCOCET) 7.5-325 MG per tablet Take 1 tablet by mouth every 4 (four) hours as needed (pain).   0  . Potassium Gluconate 595 MG CAPS Take 1 capsule (595 mg total) by mouth daily. OK to take extra on days you need extra lasix 100 capsule 3  . ranolazine (RANEXA) 1000 MG SR tablet Take 1 tablet (1,000 mg total) by mouth 2 (two) times daily. 180 tablet 3  . sertraline (ZOLOFT) 50 MG tablet TAKE ONE TABLET BY MOUTH ONCE DAILY 30 tablet 3  . amoxicillin (AMOXIL) 500 MG capsule Take 500 mg by mouth 2 (two) times daily. DENTAL PROCEDURES ONLY  1   No current facility-administered medications for this visit.    Allergies:   Amiodarone; Phenergan; Warfarin and related; and Nitroglycerin    Social History:  The patient  reports that she quit smoking about 26 years ago. Her smoking use included Cigarettes. She has a 3 pack-year smoking history. She has never used smokeless tobacco. She reports that she does not drink alcohol or use illicit drugs.   Family History:  The patient's family history includes Anemia in her mother; Cancer in her paternal grandfather; Diabetes in her father and paternal grandfather; Heart  disease in her father and maternal grandfather;  Hyperlipidemia in her maternal grandfather; Stroke in her father; Thyroid disease in her mother. There is no history of Coronary artery disease, Sudden death, or Heart attack.    ROS:  Please see the history of present illness.    Review of Systems  Constitution: Positive for weight gain.  HENT: Positive for headaches.   Cardiovascular: Positive for dyspnea on exertion, irregular heartbeat and leg swelling.  Musculoskeletal: Positive for back pain and myalgias.  Neurological: Positive for dizziness.  Psychiatric/Behavioral: Positive for depression. The patient is nervous/anxious.   All other systems reviewed and are negative.   PHYSICAL EXAM: VS:  BP 100/60 mmHg  Pulse 75  Ht 5' (1.524 m)  Wt 232 lb (105.235 kg)  BMI 45.31 kg/m2    Wt Readings from Last 3 Encounters:  03/06/15 232 lb (105.235 kg)  01/27/15 233 lb 9.6 oz (105.96 kg)  12/26/14 234 lb (106.142 kg)     GEN: Well nourished, well developed, in no acute distress HEENT: normal Neck: no JVD, no masses Cardiac:  Normal S1/S2, RRR; 1-2/6 systolic murmur LSB, no rubs or gallops, no edema  Respiratory:  clear to auscultation bilaterally, no wheezing, rhonchi or rales. GI: soft, nontender, nondistended, + BS MS: no deformity or atrophy Skin: warm and dry  Neuro:  CNs II-XII intact, Strength and sensation are intact Psych: Normal affect   EKG:  EKG is ordered today.  It demonstrates:   Atrial paced, HR 75, LBBB   Recent Labs: 08/31/2014: Pro B Natriuretic peptide (BNP) 723.7* 12/12/2014: B Natriuretic Peptide 431.7*; Hemoglobin 14.4; Platelets 268 12/26/2014: ALT 34; BUN 10; Creatinine 0.72; Magnesium 1.8; Potassium 3.9; Sodium 135; TSH 1.35    Lipid Panel    Component Value Date/Time   CHOL 192 08/16/2013 0823   TRIG 152.0* 08/16/2013 0823   HDL 49.10 08/16/2013 0823   CHOLHDL 4 08/16/2013 0823   VLDL 30.4 08/16/2013 0823   LDLCALC 113* 08/16/2013 0823       ASSESSMENT AND PLAN:  1.  Atrial Arrhythmias:  Maintaining sinus rhythm. She remains on Amiodarone and Eliquis. She is tolerating the Amiodarone thus far without nausea.  She remains on Zofran as well.  Check LFTs, TSH today. 2.  Ventricular tachycardia/Ventricular Fibrillation:  No further episodes. She remains on mexiletine, Amiodarone and Ranexa.  FU with EP as planned.  3.  S/p AICD:  Follow-up with EP as planned. 4.  Hypertrophic Obstructive Cardiomyopathy, s/p Septal Myomectomy:  Echo in 10/2014 fairly stable.   5.  S/p Mitral Valve Repair:  As noted, Echo in Feb with fairly stable MV gradients.  Continue SBE prophylaxis.   6.  Chronic Combined Systolic and Diastolic CHF:   Volume stable.  Neck veins flat and lungs are clear.  She is NYHA 2b-3.  She has had a cough at night recently.  She does have astham.  This may be from acid reflux. Will check BMET, BNP.  Increase Lasix for several days if BNP elevated.  If BNP ok, I would suggest she try some OTC Pepcid at bedtime to see if this helps.  Current medicines are reviewed at length with the patient today.  Any concerns regarding medications are listed above. The following changes have been made:   None     Labs/ tests ordered today include:    Orders Placed This Encounter  Procedures  . Basic metabolic panel  . TSH  . Hepatic function panel  . B Nat Peptide    Disposition:   FU Dr. Casimiro NeedleMichael  Cooper 6 mos.  Dr. Lewayne Bunting as planned.   Signed, Brynda Rim, MHS 03/06/2015 10:59 AM    [facility] [address] [place], [place], [place]  [address] Phone: [phone]; Fax: [phone]

## 2015-03-06 ENCOUNTER — Encounter: Payer: Self-pay | Admitting: Physician Assistant

## 2015-03-06 ENCOUNTER — Ambulatory Visit (INDEPENDENT_AMBULATORY_CARE_PROVIDER_SITE_OTHER): Payer: BLUE CROSS/BLUE SHIELD | Admitting: Physician Assistant

## 2015-03-06 VITALS — BP 100/60 | HR 75 | Ht 60.0 in | Wt 232.0 lb

## 2015-03-06 DIAGNOSIS — I422 Other hypertrophic cardiomyopathy: Secondary | ICD-10-CM | POA: Diagnosis not present

## 2015-03-06 DIAGNOSIS — I472 Ventricular tachycardia, unspecified: Secondary | ICD-10-CM

## 2015-03-06 DIAGNOSIS — Z9889 Other specified postprocedural states: Secondary | ICD-10-CM

## 2015-03-06 DIAGNOSIS — Z79899 Other long term (current) drug therapy: Secondary | ICD-10-CM | POA: Diagnosis not present

## 2015-03-06 DIAGNOSIS — Z95810 Presence of automatic (implantable) cardiac defibrillator: Secondary | ICD-10-CM | POA: Diagnosis not present

## 2015-03-06 DIAGNOSIS — R0602 Shortness of breath: Secondary | ICD-10-CM | POA: Diagnosis not present

## 2015-03-06 DIAGNOSIS — I5042 Chronic combined systolic (congestive) and diastolic (congestive) heart failure: Secondary | ICD-10-CM

## 2015-03-06 DIAGNOSIS — I484 Atypical atrial flutter: Secondary | ICD-10-CM

## 2015-03-06 LAB — BASIC METABOLIC PANEL
BUN: 13 mg/dL (ref 6–23)
CALCIUM: 9.1 mg/dL (ref 8.4–10.5)
CHLORIDE: 100 meq/L (ref 96–112)
CO2: 29 meq/L (ref 19–32)
CREATININE: 0.96 mg/dL (ref 0.40–1.20)
GFR: 66.98 mL/min (ref >60.00)
GLUCOSE: 76 mg/dL (ref 70–99)
POTASSIUM: 4.3 meq/L (ref 3.5–5.1)
Sodium: 136 mEq/L (ref 135–145)

## 2015-03-06 LAB — BRAIN NATRIURETIC PEPTIDE: Pro B Natriuretic peptide (BNP): 215 pg/mL — ABNORMAL HIGH (ref 0.0–100.0)

## 2015-03-06 LAB — HEPATIC FUNCTION PANEL
ALT: 42 U/L — ABNORMAL HIGH (ref 0–35)
AST: 28 U/L (ref 0–37)
Albumin: 4.2 g/dL (ref 3.5–5.2)
Alkaline Phosphatase: 58 U/L (ref 39–117)
Bilirubin, Direct: 0.1 mg/dL (ref 0.0–0.3)
TOTAL PROTEIN: 7.6 g/dL (ref 6.0–8.3)
Total Bilirubin: 0.5 mg/dL (ref 0.2–1.2)

## 2015-03-06 LAB — TSH: TSH: 9.62 u[IU]/mL — AB (ref 0.35–4.50)

## 2015-03-06 NOTE — Addendum Note (Signed)
Addended by: Reesa ChewJONES, Majid Mccravy G on: 03/06/2015 11:30 AM   Modules accepted: Orders

## 2015-03-06 NOTE — Patient Instructions (Addendum)
Medication Instructions:  Your physician recommends that you continue on your current medications as directed. Please refer to the Current Medication list given to you today.   Labwork: TODAY BMET, TSH, LFT  Testing/Procedures: NONE  Follow-Up: Your physician wants you to follow-up in: 6 MONTHS WITH DR. Excell SeltzerOOPER. You will receive a reminder letter in the mail two months in advance. If you don't receive a letter, please call our office to schedule the follow-up appointment.   Any Other Special Instructions Will Be Listed Below (If Applicable).

## 2015-03-07 ENCOUNTER — Telehealth: Payer: Self-pay | Admitting: *Deleted

## 2015-03-07 DIAGNOSIS — E039 Hypothyroidism, unspecified: Secondary | ICD-10-CM

## 2015-03-07 NOTE — Telephone Encounter (Signed)
PA for Pulmicort in your IN box for completion.

## 2015-03-07 NOTE — Telephone Encounter (Signed)
Pt notified of lab results and to take extra lasix 40 mg and extra dose of K+ x 2 days then resume regular doses of lasix and K+. Pt states had missed 3 doses of thyroid med. Advised to take Synthroid as prescribed. Repeat TSH, Free T4 and Free T3  6/17, pt agreeable to plan of care.

## 2015-03-09 NOTE — Telephone Encounter (Signed)
PA faxed. Will await determination. 

## 2015-03-09 NOTE — Telephone Encounter (Signed)
Filled and in Kim's box. 

## 2015-03-10 ENCOUNTER — Other Ambulatory Visit: Payer: Self-pay | Admitting: Neurology

## 2015-03-10 NOTE — Telephone Encounter (Signed)
Rx sent 

## 2015-03-13 MED ORDER — MOMETASONE FUROATE 220 MCG/INH IN AEPB: 1.0000 | INHALATION_SPRAY | Freq: Every day | RESPIRATORY_TRACT | Status: DC

## 2015-03-13 NOTE — Addendum Note (Signed)
Addended by: Eustaquio Boyden on: 03/13/2015 10:53 AM   Modules accepted: Orders, Medications

## 2015-03-13 NOTE — Telephone Encounter (Addendum)
asmanex sent in. plz notify patient

## 2015-03-13 NOTE — Telephone Encounter (Signed)
Patient notified and verbalized understanding. 

## 2015-03-13 NOTE — Telephone Encounter (Signed)
Pulmicort denied. Approved meds are Asmanex and Flovent.

## 2015-03-16 ENCOUNTER — Other Ambulatory Visit: Payer: Self-pay | Admitting: Internal Medicine

## 2015-03-17 ENCOUNTER — Other Ambulatory Visit: Payer: BLUE CROSS/BLUE SHIELD

## 2015-03-17 ENCOUNTER — Other Ambulatory Visit: Payer: Self-pay | Admitting: *Deleted

## 2015-03-17 DIAGNOSIS — I4891 Unspecified atrial fibrillation: Secondary | ICD-10-CM

## 2015-03-17 MED ORDER — MEXILETINE HCL 150 MG PO CAPS: 300.0000 mg | ORAL_CAPSULE | Freq: Three times a day (TID) | ORAL | Status: DC

## 2015-03-17 MED ORDER — APIXABAN 5 MG PO TABS: 5.0000 mg | ORAL_TABLET | Freq: Two times a day (BID) | ORAL | Status: DC

## 2015-03-20 ENCOUNTER — Other Ambulatory Visit: Payer: Self-pay | Admitting: *Deleted

## 2015-03-20 ENCOUNTER — Other Ambulatory Visit (INDEPENDENT_AMBULATORY_CARE_PROVIDER_SITE_OTHER): Payer: BLUE CROSS/BLUE SHIELD | Admitting: *Deleted

## 2015-03-20 DIAGNOSIS — E039 Hypothyroidism, unspecified: Secondary | ICD-10-CM | POA: Diagnosis not present

## 2015-03-20 DIAGNOSIS — R7989 Other specified abnormal findings of blood chemistry: Secondary | ICD-10-CM

## 2015-03-20 LAB — T4, FREE: Free T4: 0.74 ng/dL (ref 0.60–1.60)

## 2015-03-20 LAB — TSH: TSH: 11.44 u[IU]/mL — ABNORMAL HIGH (ref 0.35–4.50)

## 2015-03-20 LAB — T3, FREE: T3, Free: 2.9 pg/mL (ref 2.3–4.2)

## 2015-03-20 MED ORDER — LEVOTHYROXINE SODIUM 75 MCG PO TABS: 75.0000 ug | ORAL_TABLET | Freq: Every day | ORAL | Status: DC

## 2015-03-20 NOTE — Addendum Note (Signed)
Addended by: Tonita Phoenix on: 03/20/2015 07:36 AM   Modules accepted: Orders

## 2015-03-24 ENCOUNTER — Telehealth: Payer: Self-pay | Admitting: Physician Assistant

## 2015-03-24 NOTE — Telephone Encounter (Signed)
Follow Up ° ° ° ° ° ° °Pt returning Carol's phone call. °

## 2015-03-24 NOTE — Telephone Encounter (Signed)
Pt c/o medication issue:  1. Name of Medication: Levothyroxine  2. How are you currently taking this medication (dosage and times per day)? 75 mg 1x per day  3. Are you having a reaction (difficulty breathing--STAT)? No  4. What is your medication issue? Pt calling stating that she is shaking and feeling nauseous. Please call back and advise.

## 2015-03-27 NOTE — Telephone Encounter (Signed)
S/w pt Friday 6/24 about Levothyroxine and how it is making her feel since dose increased. I advised for her to f/u w/PCP for this even though Lorin Picket W. PA did increase med it was only done due to her lab work and to help her thyroid function. I advised that the provider may take care of a primary care medication if lab work shows that it needs to be adjusted however to f/u w/provider who does take care of this medication a regular basis for her. Pt said ok and thank you.

## 2015-03-30 ENCOUNTER — Ambulatory Visit (INDEPENDENT_AMBULATORY_CARE_PROVIDER_SITE_OTHER): Payer: BLUE CROSS/BLUE SHIELD | Admitting: Internal Medicine

## 2015-03-30 ENCOUNTER — Encounter: Payer: Self-pay | Admitting: Internal Medicine

## 2015-03-30 VITALS — BP 102/54 | HR 75 | Ht 60.0 in | Wt 233.6 lb

## 2015-03-30 DIAGNOSIS — I5042 Chronic combined systolic (congestive) and diastolic (congestive) heart failure: Secondary | ICD-10-CM

## 2015-03-30 DIAGNOSIS — Z95810 Presence of automatic (implantable) cardiac defibrillator: Secondary | ICD-10-CM

## 2015-03-30 DIAGNOSIS — I4901 Ventricular fibrillation: Secondary | ICD-10-CM

## 2015-03-30 DIAGNOSIS — I422 Other hypertrophic cardiomyopathy: Secondary | ICD-10-CM

## 2015-03-30 DIAGNOSIS — I472 Ventricular tachycardia, unspecified: Secondary | ICD-10-CM

## 2015-03-30 DIAGNOSIS — E669 Obesity, unspecified: Secondary | ICD-10-CM

## 2015-03-30 DIAGNOSIS — I4891 Unspecified atrial fibrillation: Secondary | ICD-10-CM | POA: Diagnosis not present

## 2015-03-30 LAB — CUP PACEART INCLINIC DEVICE CHECK
Date Time Interrogation Session: 20160630040000
HIGH POWER IMPEDANCE MEASURED VALUE: 41 Ohm
HIGH POWER IMPEDANCE MEASURED VALUE: 55 Ohm
Lead Channel Impedance Value: 514 Ohm
Lead Channel Pacing Threshold Amplitude: 1.2 V
Lead Channel Pacing Threshold Pulse Width: 0.5 ms
Lead Channel Pacing Threshold Pulse Width: 0.5 ms
Lead Channel Sensing Intrinsic Amplitude: 15.8 mV
Lead Channel Sensing Intrinsic Amplitude: 6.7 mV
Lead Channel Setting Pacing Amplitude: 2.4 V
Lead Channel Setting Sensing Sensitivity: 0.6 mV
MDC IDC MSMT LEADCHNL RA IMPEDANCE VALUE: 578 Ohm
MDC IDC MSMT LEADCHNL RA PACING THRESHOLD AMPLITUDE: 0.9 V
MDC IDC SET LEADCHNL RA PACING AMPLITUDE: 2 V
MDC IDC SET LEADCHNL RV PACING PULSEWIDTH: 0.5 ms
MDC IDC SET ZONE DETECTION INTERVAL: 250 ms
Pulse Gen Serial Number: 102591
Zone Setting Detection Interval: 286 ms

## 2015-03-30 MED ORDER — AMIODARONE HCL 200 MG PO TABS: ORAL_TABLET | ORAL | Status: DC

## 2015-03-30 MED ORDER — FUROSEMIDE 40 MG PO TABS: ORAL_TABLET | ORAL | Status: DC

## 2015-03-30 MED ORDER — FUROSEMIDE 40 MG PO TABS: 40.0000 mg | ORAL_TABLET | Freq: Every day | ORAL | Status: DC

## 2015-03-30 NOTE — Assessment & Plan Note (Signed)
She is maintaining NSR very nicely. Will start to slowly wean amio. She will continue Eliquis.

## 2015-03-30 NOTE — Assessment & Plan Note (Signed)
Her ventricular arrhythmias are well controlled. She will continue her current meds. We will slowly wean amiodarone.

## 2015-03-30 NOTE — Assessment & Plan Note (Signed)
Her [employer] ICD is working normally. Will recheck in several months. 

## 2015-03-30 NOTE — Patient Instructions (Signed)
Medication Instructions:  Your physician has recommended you make the following change in your medication:  1) Decrease Amiodarone to 1 tablet twice daily Mon-Fri and 1 1/2 tablets daily Sat and Sun   Labwork: None ordered  Testing/Procedures: None ordered  Follow-Up: Your physician recommends that you schedule a follow-up appointment in: 3 months with Dr Ladona Ridgelaylor   Any Other Special Instructions Will Be Listed Below (If Applicable).

## 2015-03-30 NOTE — Progress Notes (Signed)
HPI [name] returns today for followup. She is a pleasant 44 yo woman with multiple medical problems including HCM, s/p myomectomy, VT, chronic systolic heart failure, LBBB, and HTN. The patient has in the past had severe nausea with amiodarone. She has been on multiple AA drugs including Mexilitine and Ranexa. She has had recurrent VT/VF as well as atrial tachy and flutter. She asked to retry initiation of amiodarone used in conjunction with Zofran. She has been on this medication for the past 3 months and her VT has been quiet. She still gets nauseated but it continues to improve.  Allergies  Allergen Reactions  . Amiodarone Nausea And Vomiting    Amiodarone tablets cause nausea (unless taking Zofran) Can tolerate IV Amiodarone  . Phenergan [Promethazine Hcl] Itching  . Warfarin And Related Other (See Comments)    'burns my skin'  . Nitroglycerin Other (See Comments)    Hypotension     Current Outpatient Prescriptions  Medication Sig Dispense Refill  . acetaminophen (TYLENOL) 500 MG tablet Take 500-1,000 mg by mouth 2 (two) times daily as needed for mild pain, moderate pain, fever or headache (pain).     Marland Kitchen albuterol (PROVENTIL HFA;VENTOLIN HFA) 108 (90 BASE) MCG/ACT inhaler Inhale 1 puff into the lungs 2 (two) times daily as needed for wheezing or shortness of breath.     . ALPRAZolam (XANAX) 1 MG tablet Take 1 mg by mouth 3 (three) times daily as needed for anxiety.    Marland Kitchen amiodarone (PACERONE) 200 MG tablet Take 200 mg by mouth 2 (two) times daily.    Marland Kitchen amoxicillin (AMOXIL) 500 MG capsule Take 500 mg by mouth 2 (two) times daily. DENTAL PROCEDURES ONLY  1  . beclomethasone (QVAR) 80 MCG/ACT inhaler Inhale 1 puff into the lungs 2 (two) times daily.    . cetirizine (ZYRTEC) 10 MG tablet Take 10 mg by mouth daily.     Marland Kitchen docusate sodium (COLACE) 100 MG capsule Take 100 mg by mouth 2 (two) times daily as needed for mild constipation.    Marland Kitchen ELIQUIS 5 MG TABS tablet TAKE ONE TABLET  BY MOUTH TWICE DAILY 60 tablet 5  . furosemide (LASIX) 40 MG tablet Take 40 mg by mouth daily. Pt sometimes takes 80 mg by mouth daily for swelling issues (Takes an extra potassium when she does this)    . gabapentin (NEURONTIN) 300 MG capsule TAKE TWO CAPSULES BY MOUTH THREE TIMES DAILY 180 capsule 0  . levonorgestrel (MIRENA) 20 MCG/24HR IUD 1 each by Intrauterine route once.    Marland Kitchen levothyroxine (SYNTHROID, LEVOTHROID) 75 MCG tablet Take 1 tablet (75 mcg total) by mouth daily before breakfast. 90 tablet 3  . Magnesium Gluconate 250 MG TABS Take 500 mg by mouth 2 (two) times daily.    . metoprolol succinate (TOPROL-XL) 25 MG 24 hr tablet Take 3 tablets (75 mg total) by mouth 2 (two) times daily. 180 tablet 11  . mexiletine (MEXITIL) 150 MG capsule TAKE TWO CAPSULES BY MOUTH THREE TIMES DAILY 180 capsule 5  . mexiletine (MEXITIL) 150 MG capsule Take 2 capsules (300 mg total) by mouth 3 (three) times daily. 540 capsule 3  . Olopatadine HCl (PATANASE) 0.6 % SOLN Place 1 puff into the nose daily as needed (for allergies).     . ondansetron (ZOFRAN) 4 MG tablet Take 1 tablet (4 mg total) by mouth every 6 (six) hours as needed for nausea or vomiting. 30 tablet 6  . oxyCODONE-acetaminophen (PERCOCET) 7.5-325  MG per tablet Take 1 tablet by mouth every 4 (four) hours as needed (pain).   0  . Potassium Gluconate 595 MG CAPS Take 1 capsule (595 mg total) by mouth daily. OK to take extra on days you need extra lasix 100 capsule 3  . ranolazine (RANEXA) 1000 MG SR tablet Take 1 tablet (1,000 mg total) by mouth 2 (two) times daily. 180 tablet 3  . sertraline (ZOLOFT) 50 MG tablet TAKE ONE TABLET BY MOUTH ONCE DAILY 30 tablet 3   No current facility-administered medications for this visit.     Past Medical History  Diagnosis Date  . Hypertrophic cardiomyopathy     s/p septal myomectomy with implantable defibrillator 01/27/2012   . Anxiety state, unspecified   . Allergic rhinitis     to dogs  . Asthma   .  Anemia     during pregnancy  . History of chicken pox   . Generalized headaches   . Ocular migraine     no HA  . Back pain     told cracked bone, vicodin didn't help, improved with percocets/muscle relaxants, no eval by prior PCP  . 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 DVT (deep venous thrombosis) 01/2012    was placed on Warfarin after cardiac surgery but had skin reaction to warfarin so was changed to Pradaxa so DVT occurred while on Pradaxa although unclear if it occurred during transition  . Hypotension   . Atrial fibrillation     a. On pradaxa.  coumadin necrosis with warfarin;  b. Amio d/c'd 02/2012  . Pericardial effusion   . Chronic combined systolic and diastolic CHF (congestive heart failure)     a. echo 03/19/12: severe asymmetric septal hypertrophy, evidence of upper septal myomectomy, peak LV outflow tract gradient 35, EF 35%,;  b.  Echo 9/13: Severe LVH, EF 30-35%, anteroseptal and inferior HK, LVOT narrow, mild AI, mild to moderate mitral stenosis, severe LAE  . Pericardial tamponade 03/14/2012  . 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 01/2012 s/p BSX ICD;  b. 06/2012 Multaq initiated due to recurrent syncope, VT/VF - had severe n/v with IV amio.  . Skin necrosis --COUMADIN   . Chronic chest wall pain     eval by CVTS, stable, [facility]  . Syncope     a. in setting of VT/VF.  Marland Kitchen. Hypothyroid   . Blood transfusion without reported diagnosis   . Neuromuscular disorder   . Gall stones   . Atrial flutter     a. 10/2013 with inappropriate ICD therapy; b. s/p CTI ablation by Dr Ladona Ridgelaylor 11/16/2013;  c. 11/2013 syncope 2/2 aflutter/VT->failed DCCV->amio resumed w/ zofran for nausea.  Marland Kitchen. PONV (postoperative nausea and vomiting)   . Depression   . Post-traumatic stress   . Fibromyalgia   . Hx of echocardiogram     Echo (2/16): mod LVH, EF 55-60%, no RWMA, Gr 2 DD, mild AI, mild to mod MS, mod LAE     ROS:   All systems reviewed and negative except as noted in the HPI.   Past Surgical History  Procedure Laterality Date  . Cesarean section  1991  . Induced abortion  1990 and 1993  . Cardiac surgery  01/27/2012    median sternotomy, septal myectomy, MVR - [facility] (Dr. Silvestre MesiGlower)  . Cardiac defibrillator placement  01/2012    BSX dual chamber ICD  .  Myomectomy    . Pericardial window  03/14/2012    Procedure: PERICARDIAL WINDOW;  Surgeon: Purcell Nails, MD;  Location: [facility] OR;  Service: Open Heart Surgery;  Laterality: N/A;  Subxyphoid Pericardial  Window   . Ablation  11-16-13    RFCA of atrial flutter by Dr Ladona Ridgel   . Insert / replace / remove pacemaker    . Mitral valve repair    . Cholecystectomy N/A 01/26/2014    LAPAROSCOPIC CHOLECYSTECTOMY WITH INTRAOPERATIVE CHOLANGIOGRAM;  Surgeon: Wilmon Arms. Tsuei, MD  . Atrial flutter ablation N/A 11/16/2013    Procedure: ATRIAL FLUTTER ABLATION;  Surgeon: Marinus Maw, MD;  Location: [facility] CATH LAB;  Service: Cardiovascular;  Laterality: N/A;  . Cardioversion N/A 12/13/2014    Procedure: CARDIOVERSION;  Surgeon: Wendall Stade, MD;  Location: [facility] ENDOSCOPY;  Service: Cardiovascular;  Laterality: N/A;     Family History  Problem Relation Age of Onset  . Diabetes Father   . Heart disease Father     unspecified heart problem  . Thyroid disease Mother   . Anemia Mother     mediterranean anemia, ITP  . Heart disease Maternal Grandfather     CHF  . Hyperlipidemia Maternal Grandfather   . Diabetes Paternal Grandfather   . Cancer Paternal Grandfather   . Coronary artery disease Neg Hx   . Stroke Father   . Sudden death Neg Hx   . Heart attack Neg Hx      History   Social History  . Marital Status: Single    Spouse Name: N/A  . Number of Children: 1  . Years of Education: N/A   Occupational History  . Naval architect    Social History Main Topics  . Smoking status: Former Smoker -- 1.00 packs/day for 3 years    Types:  Cigarettes    Quit date: 09/30/1988  . Smokeless tobacco: Never Used     Comment: quitin 1990  . Alcohol Use: No     Comment: none since "months" as of 02/28/2013  . Drug Use: No     Comment: quiti n 1989  . Sexual Activity: Yes    Birth Control/ Protection: Pill   Other Topics Concern  . Not on file   Social History Narrative   Caffeine: 1 cup coffee/day   Divorced, 1 child.  lives with boyfriend, dogs   Occupation: Full time Tax adviser   Activity: walks 65min/day   Diet: fruits/vegetables daily, water, no fish     BP 102/54 mmHg  Pulse 75  Ht 5' (1.524 m)  Wt 233 lb 9.6 oz (105.96 kg)  BMI 45.62 kg/m2  Physical Exam:  stable appearing obese, middle aged woman, NAD HEENT: Unremarkable Neck:  6 cm JVD Back:  No CVA tenderness Lungs:  Clear with no wheezes HEART:  Regular rate rhythm, no murmurs, no rubs, no clicks Abd:  soft, positive bowel sounds, no organomegally, no rebound, no guarding Ext:  2 plus pulses, trace peripheral edema, no cyanosis, no clubbing Skin:  No rashes no nodules Neuro:  CN II through XII intact, motor grossly intact   DEVICE  Normal device function.  See PaceArt for details.   Assess/Plan:

## 2015-03-30 NOTE — Assessment & Plan Note (Signed)
Her symptoms are class 2. She will continue her current meds. 

## 2015-03-30 NOTE — Assessment & Plan Note (Signed)
She is encouraged to lose weight.  

## 2015-03-31 ENCOUNTER — Telehealth: Payer: Self-pay

## 2015-03-31 NOTE — Telephone Encounter (Signed)
rec schedule appt for eval. 

## 2015-03-31 NOTE — Telephone Encounter (Signed)
Attempted to contact pt; vm full 

## 2015-03-31 NOTE — Telephone Encounter (Signed)
Pt left v/m; pt's synthroid was increased (pt to take synthroid 75 mcg daily) recently by Tereso NewcomerScott Weaver PA and pt was advised to f/u with PCP; pt last saw Dr Sharen HonesGutierrez 08/09/14 with no f/u appt scheduled. Pt request cb to see if needs to f/u with Dr Sharen HonesGutierrez or should pt see specialist.

## 2015-04-04 NOTE — Telephone Encounter (Signed)
Attempted to contact pt; unable to leave vm 

## 2015-04-05 NOTE — Telephone Encounter (Signed)
Spoke with patient and follow up scheduled.

## 2015-04-06 ENCOUNTER — Other Ambulatory Visit: Payer: Self-pay | Admitting: Family Medicine

## 2015-04-07 NOTE — Telephone Encounter (Signed)
Rx called in as directed.   

## 2015-04-07 NOTE — Telephone Encounter (Signed)
plz phone in. 

## 2015-04-10 ENCOUNTER — Other Ambulatory Visit: Payer: Self-pay | Admitting: Neurology

## 2015-04-11 ENCOUNTER — Encounter: Payer: Self-pay | Admitting: Family Medicine

## 2015-04-11 ENCOUNTER — Ambulatory Visit (INDEPENDENT_AMBULATORY_CARE_PROVIDER_SITE_OTHER): Payer: BLUE CROSS/BLUE SHIELD | Admitting: Family Medicine

## 2015-04-11 VITALS — BP 88/60 | HR 96 | Wt 234.8 lb

## 2015-04-11 DIAGNOSIS — Z1239 Encounter for other screening for malignant neoplasm of breast: Secondary | ICD-10-CM

## 2015-04-11 DIAGNOSIS — E039 Hypothyroidism, unspecified: Secondary | ICD-10-CM | POA: Diagnosis not present

## 2015-04-11 DIAGNOSIS — F418 Other specified anxiety disorders: Secondary | ICD-10-CM | POA: Diagnosis not present

## 2015-04-11 DIAGNOSIS — R222 Localized swelling, mass and lump, trunk: Secondary | ICD-10-CM

## 2015-04-11 DIAGNOSIS — R1310 Dysphagia, unspecified: Secondary | ICD-10-CM | POA: Insufficient documentation

## 2015-04-11 MED ORDER — OMEPRAZOLE 40 MG PO CPDR: 40.0000 mg | DELAYED_RELEASE_CAPSULE | Freq: Every day | ORAL | Status: DC

## 2015-04-11 NOTE — Assessment & Plan Note (Signed)
Discussed this. Pending rpt TSH 05/01/2015. No thyromegaly appreciated today.

## 2015-04-11 NOTE — Assessment & Plan Note (Signed)
?  scarred knot from defibrillator placement, but pt states this is new over last few days, and hasn't felt in the past. No LAD appreciated.  Will order soft tissue ultrasound for further evaluation. Will also get baseline mammogram for breast cancer screening. Pt agrees. Lump is separate from breast tissue.

## 2015-04-11 NOTE — Addendum Note (Signed)
Addended by: Eustaquio BoydenGUTIERREZ, Ziare Cryder on: 04/11/2015 09:55 AM   Modules accepted: Orders

## 2015-04-11 NOTE — Patient Instructions (Addendum)
For possible heartburn - start omeprazole 40mg  daily for 3 weeks then as needed. For trouble swallowing - we will schedule barium swallow. We will also order ultrasound for chest wall swelling and baseline screening mammogram for breast cancer screening. Pass by Marion's office to schedule imaging studies. Good to see you today, call us with questions.

## 2015-04-11 NOTE — Assessment & Plan Note (Signed)
overall stable on sertraline  daily + xanax prn.

## 2015-04-11 NOTE — Progress Notes (Signed)
BP 88/60 mmHg  Pulse 96  Wt 234 lb 12.8 oz (106.505 kg)  SpO2 98%   CC: f/u visit  Subjective:    Patient ID: [name], female    DOB: 04-28-1971, 44 y.o.   MRN: [id number]  HPI: [name] is a 44 y.o. female presenting on 04/11/2015 for Follow-up   Pleasant 44yo with h/o HCM s/p myomectomy, VT, chronic systolic heart failure, LBBB and HTN recently with hypotensive episodes. She is currently tolerating amiodarone with zofran for nausea from drug. Last plan by EP was to slowly wean down amio and continue eliquis.   Last seen here 07/2014. At that time we started sertraline 50mg  daily. Pt never f/u. Also on xanax BID to TID prn. Thinks she's doing well with sertraline. No recent crying spells.  Hypothyroidism - recent TSH elevated at 11.44 (03/20/2015). Free T4 0.74. Synthroid was increased to daily with rec rpt TSH in 6 wks. Weight gain noted. + heat and cold intolerance.   Noticed lump on left chest 2 days ago. Not tender or itchy. No recent mammogram.  Dysphagia ongoing for last several weeks, happens several times a week. Mainly trouble to rice and bread. No liquid dysphagia or pill dysphagia. GERD sxs at night, pt attributes to mexiletine pills.   Stays tired. bp low. On mirena, periods irregular. LMP in 2015.   Relevant past medical, surgical, family and social history reviewed and updated as indicated. Interim medical history since our last visit reviewed. Allergies and medications reviewed and updated. Current Outpatient Prescriptions on File Prior to Visit  Medication Sig  . acetaminophen (TYLENOL) 500 MG tablet Take 500-1,000 mg by mouth 2 (two) times daily as needed for mild pain, moderate pain, fever or headache (pain).   Marland Kitchen albuterol (PROVENTIL HFA;VENTOLIN HFA) 108 (90 BASE) MCG/ACT inhaler Inhale 1 puff into the lungs 2 (two) times daily as needed for wheezing or shortness of breath.   . ALPRAZolam (XANAX) 1 MG tablet TAKE ONE TABLET BY MOUTH THREE TIMES  DAILY AS NEEDED  . amiodarone (PACERONE) 200 MG tablet Take 1 tablet by mouth twice daily Mon- Fri and 1 1/2 tablets by mouth daily Sat and Sun  . amoxicillin (AMOXIL) 500 MG capsule Take 500 mg by mouth 2 (two) times daily. DENTAL PROCEDURES ONLY  . beclomethasone (QVAR) 80 MCG/ACT inhaler Inhale 1 puff into the lungs 2 (two) times daily.  . cetirizine (ZYRTEC) 10 MG tablet Take 10 mg by mouth daily.   Marland Kitchen docusate sodium (COLACE) 100 MG capsule Take 100 mg by mouth 2 (two) times daily as needed for mild constipation.  Marland Kitchen ELIQUIS 5 MG TABS tablet TAKE ONE TABLET BY MOUTH TWICE DAILY  . furosemide (LASIX) 40 MG tablet Pt may take 1 extra tablet daily as needed for swelling (Takes an extra potassium when she does this)  . gabapentin (NEURONTIN) 300 MG capsule TAKE TWO CAPSULES BY MOUTH THREE TIMES DAILY  . levonorgestrel (MIRENA) 20 MCG/24HR IUD 1 each by Intrauterine route once.  Marland Kitchen levothyroxine (SYNTHROID, LEVOTHROID) 75 MCG tablet Take 1 tablet (75 mcg total) by mouth daily before breakfast.  . Magnesium Gluconate 250 MG TABS Take 500 mg by mouth 2 (two) times daily.  . metoprolol succinate (TOPROL-XL) 25 MG 24 hr tablet Take 3 tablets (75 mg total) by mouth 2 (two) times daily.  Marland Kitchen mexiletine (MEXITIL) 150 MG capsule TAKE TWO CAPSULES BY MOUTH THREE TIMES DAILY  . mexiletine (MEXITIL) 150 MG capsule Take 2 capsules (300  mg total) by mouth 3 (three) times daily.  . Olopatadine HCl (PATANASE) 0.6 % SOLN Place 1 puff into the nose daily as needed (for allergies).   . ondansetron (ZOFRAN) 4 MG tablet Take 1 tablet (4 mg total) by mouth every 6 (six) hours as needed for nausea or vomiting.  Marland Kitchen oxyCODONE-acetaminophen (PERCOCET) 7.5-325 MG per tablet Take 1 tablet by mouth every 4 (four) hours as needed (pain).   . Potassium Gluconate 595 MG CAPS Take 1 capsule (595 mg total) by mouth daily. OK to take extra on days you need extra lasix  . ranolazine (RANEXA) 1000 MG SR tablet Take 1 tablet (1,000 mg  total) by mouth 2 (two) times daily.  . sertraline (ZOLOFT) 50 MG tablet TAKE ONE TABLET BY MOUTH ONCE DAILY   No current facility-administered medications on file prior to visit.    Review of Systems Per HPI unless specifically indicated above     Objective:    BP 88/60 mmHg  Pulse 96  Wt 234 lb 12.8 oz (106.505 kg)  SpO2 98%  Wt Readings from Last 3 Encounters:  04/11/15 234 lb 12.8 oz (106.505 kg)  03/30/15 233 lb 9.6 oz (105.96 kg)  03/06/15 232 lb (105.235 kg)    Physical Exam  Constitutional: She appears well-developed and well-nourished. No distress.  HENT:  Head: Normocephalic and atraumatic.  Mouth/Throat: Oropharynx is clear and moist. No oropharyngeal exudate.  Eyes: Conjunctivae and EOM are normal. Pupils are equal, round, and reactive to light.  Neck: Normal range of motion. Neck supple. No thyromegaly present.  Pulmonary/Chest: She exhibits no tenderness.    Small firm slightly tender lump above defibrillator incision on left upper chest wall  Lymphadenopathy:       Head (right side): No submental, no submandibular, no tonsillar, no preauricular and no posterior auricular adenopathy present.       Head (left side): No submental, no submandibular, no tonsillar, no preauricular and no posterior auricular adenopathy present.    She has no cervical adenopathy.    She has no axillary adenopathy.       Right axillary: No lateral adenopathy present.       Left axillary: No lateral adenopathy present.      Right: No supraclavicular adenopathy present.       Left: No supraclavicular adenopathy present.  Skin: Skin is warm and dry. No rash noted.  Psychiatric: She has a normal mood and affect.  Nursing note and vitals reviewed.       Assessment & Plan:   Problem List Items Addressed This Visit    Depression with anxiety - Primary (Chronic)    overall stable on sertraline  daily + xanax prn.      Dysphagia    Describes esophageal dysphagia to solids.  Endorses some GERD sxs at night time. Will start omeprazole  daily x3 wks then prn, and will order barium swallow to help r/o anatomical cause of dysphagia. Pt agrees with plan.      Relevant Orders   DG Esophagus   Hypothyroidism (Chronic)    Discussed this. Pending rpt TSH 05/01/2015. No thyromegaly appreciated today.      Mass of chest wall, left    ?scarred knot from defibrillator placement, but pt states this is new over last few days, and hasn't felt in the past. No LAD appreciated.  Will order soft tissue ultrasound for further evaluation. Will also get baseline mammogram for breast cancer screening. Pt agrees. Lump is separate from breast  tissue.      Relevant Orders   US Misc Soft Tissue    Other Visit Diagnoses    Breast cancer screening        Relevant Orders    MM DIGITAL SCREENING BILATERAL        Follow up plan: Return in about 6 months (around 10/12/2015), or as needed, for annual exam, prior fasting for blood work.

## 2015-04-11 NOTE — Progress Notes (Signed)
Pre visit review using our clinic review tool, if applicable. No additional management support is needed unless otherwise documented below in the visit note. 

## 2015-04-11 NOTE — Assessment & Plan Note (Signed)
Describes esophageal dysphagia to solids. Endorses some GERD sxs at night time. Will start omeprazole  daily x3 wks then prn, and will order barium swallow to help r/o anatomical cause of dysphagia. Pt agrees with plan.

## 2015-04-13 ENCOUNTER — Telehealth: Payer: Self-pay | Admitting: Family Medicine

## 2015-04-13 DIAGNOSIS — E785 Hyperlipidemia, unspecified: Secondary | ICD-10-CM

## 2015-04-13 NOTE — Telephone Encounter (Signed)
Labs ordered by cards. But would have her wait until 8/1 when she's due for thyroid recheck.

## 2015-04-13 NOTE — Telephone Encounter (Signed)
Message left advising patient.  

## 2015-04-13 NOTE — Telephone Encounter (Signed)
Pt called. When she was seen the other day, there was mention of lab work. [facility] would [facility] to [facility]. Can you please enter orders.  She is planning on going tomorrow 7/15.

## 2015-04-14 ENCOUNTER — Ambulatory Visit (HOSPITAL_COMMUNITY)
Admission: RE | Admit: 2015-04-14 | Discharge: 2015-04-14 | Disposition: A | Discharge status: Home / Self Care | Payer: BLUE CROSS/BLUE SHIELD | Source: Ambulatory Visit | Attending: Family Medicine | Admitting: Family Medicine

## 2015-04-14 ENCOUNTER — Other Ambulatory Visit: Payer: BLUE CROSS/BLUE SHIELD

## 2015-04-14 DIAGNOSIS — R1310 Dysphagia, unspecified: Secondary | ICD-10-CM | POA: Diagnosis not present

## 2015-04-16 ENCOUNTER — Other Ambulatory Visit: Payer: Self-pay | Admitting: Family Medicine

## 2015-04-16 ENCOUNTER — Other Ambulatory Visit: Payer: Self-pay | Admitting: Cardiovascular Disease

## 2015-04-17 ENCOUNTER — Other Ambulatory Visit: Payer: Self-pay

## 2015-04-17 MED ORDER — RANOLAZINE ER 1000 MG PO TB12: 1000.0000 mg | ORAL_TABLET | Freq: Two times a day (BID) | ORAL | Status: AC

## 2015-04-19 ENCOUNTER — Ambulatory Visit
Admission: RE | Admit: 2015-04-19 | Discharge: 2015-04-19 | Disposition: A | Discharge status: Home / Self Care | Payer: BLUE CROSS/BLUE SHIELD | Source: Ambulatory Visit | Attending: Family Medicine | Admitting: Family Medicine

## 2015-04-19 DIAGNOSIS — Z1239 Encounter for other screening for malignant neoplasm of breast: Secondary | ICD-10-CM

## 2015-04-19 DIAGNOSIS — R222 Localized swelling, mass and lump, trunk: Secondary | ICD-10-CM

## 2015-04-20 LAB — HM MAMMOGRAPHY: HM MAMMO: NORMAL

## 2015-04-21 ENCOUNTER — Encounter: Payer: Self-pay | Admitting: Family Medicine

## 2015-04-25 ENCOUNTER — Encounter: Payer: Self-pay | Admitting: Internal Medicine

## 2015-04-26 ENCOUNTER — Telehealth: Payer: Self-pay

## 2015-04-26 NOTE — Telephone Encounter (Signed)
Pt request results of [place] chest and EG esophagus. Pt having problems with internet and has not seen mychart. Pt notified as instructed from mychart note; pt voiced understanding and will cb with update in 2 weeks; sooner if needed.

## 2015-05-01 ENCOUNTER — Other Ambulatory Visit: Payer: BLUE CROSS/BLUE SHIELD

## 2015-05-02 ENCOUNTER — Other Ambulatory Visit (INDEPENDENT_AMBULATORY_CARE_PROVIDER_SITE_OTHER): Payer: BLUE CROSS/BLUE SHIELD

## 2015-05-02 DIAGNOSIS — R946 Abnormal results of thyroid function studies: Secondary | ICD-10-CM | POA: Diagnosis not present

## 2015-05-02 DIAGNOSIS — R7989 Other specified abnormal findings of blood chemistry: Secondary | ICD-10-CM

## 2015-05-02 LAB — TSH: TSH: 14.72 u[IU]/mL — AB (ref 0.35–4.50)

## 2015-05-03 ENCOUNTER — Other Ambulatory Visit: Payer: Self-pay | Admitting: *Deleted

## 2015-05-03 ENCOUNTER — Telehealth: Payer: Self-pay | Admitting: Family Medicine

## 2015-05-03 DIAGNOSIS — E0590 Thyrotoxicosis, unspecified without thyrotoxic crisis or storm: Secondary | ICD-10-CM

## 2015-05-03 MED ORDER — LEVOTHYROXINE SODIUM 100 MCG PO TABS: 100.0000 ug | ORAL_TABLET | Freq: Every day | ORAL | Status: DC

## 2015-05-03 NOTE — Telephone Encounter (Signed)
Recommend continue levothyroxine daily as prescribed by cards. Make sure she is taking first thing in am 30 min before meal and separate from any other meds, vitamins, supplements to ensure best absorption of medication. Would just have her recheck thyroid in 4-6 wks after regular with above instructions (and not wait full 8 wks). Don't think she needs endo referral currently as we are slowly increasing levothyroxine appropriately  Wt Readings from Last 3 Encounters:  04/11/15 234 lb 12.8 oz (106.505 kg)  03/30/15 233 lb 9.6 oz (105.96 kg)  03/06/15 232 lb (105.235 kg)

## 2015-05-03 NOTE — Telephone Encounter (Signed)
Pt would like a referral to endo for thyroid issues.  She is requesting [place] at the [facility] center.  Please call [phone] with referral information Thanks

## 2015-05-03 NOTE — Telephone Encounter (Signed)
Pt called back and wants to know if there is anything she can do with diet, exercise or medication since thyroid is lower.  Pt request cb. [facility] [address]

## 2015-05-04 ENCOUNTER — Other Ambulatory Visit: Payer: Self-pay | Admitting: Family Medicine

## 2015-05-04 NOTE — Telephone Encounter (Signed)
Ok to refill 

## 2015-05-04 NOTE — Telephone Encounter (Signed)
Spoke with patient. She said she had been taking it prior to meals, but had been taking it with all of her other meds. Advised to take it first and wait 30 minutes to take the rest of her meds and to have labs re-drawn in 4-6 weeks to see if that makes any difference. She verbalized understanding and will go to [place] lab for blood work. Orders in chart for TSH. Did you want to order anything else?

## 2015-05-04 NOTE — Telephone Encounter (Signed)
No that is all thanks.

## 2015-05-05 NOTE — Telephone Encounter (Signed)
plz phone in. 

## 2015-05-05 NOTE — Telephone Encounter (Signed)
Rx called in as directed.   

## 2015-05-08 ENCOUNTER — Other Ambulatory Visit: Payer: Self-pay | Admitting: Neurology

## 2015-05-09 ENCOUNTER — Ambulatory Visit: Payer: BLUE CROSS/BLUE SHIELD | Admitting: Licensed Clinical Social Worker

## 2015-05-15 ENCOUNTER — Other Ambulatory Visit: Payer: Self-pay | Admitting: *Deleted

## 2015-05-15 MED ORDER — ALBUTEROL SULFATE HFA 108 (90 BASE) MCG/ACT IN AERS: 1.0000 | INHALATION_SPRAY | Freq: Two times a day (BID) | RESPIRATORY_TRACT | Status: DC | PRN

## 2015-05-19 ENCOUNTER — Telehealth: Payer: Self-pay

## 2015-05-19 NOTE — Telephone Encounter (Signed)
For few months pt has had numbness and twitching in arms, legs and hands;no weakness in arms or legs. last night started with tingling in feet and difficulty with speech; pt would start to say a word but could not complete the entire word which are new symptoms. Pt said it was almost like her jaw was moving in the wrong direction to allow pt to form the words. Speech problem not noticed this morning but pt very concerned and offered pt appt at [facility] but pt said she cannot drive this far due to driving restrictions for her heart. Pt said [facility] on [address] is near pt and she will go there.

## 2015-05-20 NOTE — Telephone Encounter (Signed)
Noted. Agree with eval. plz call Monday for f/u on sxs

## 2015-05-22 NOTE — Telephone Encounter (Signed)
Spoke to patient and was advised that she was not able to go to [facility] because her ride backed out of taking her. Patient stated that her speech is better, but still having some twitching and tingling in her feet. Offered patient an appointment which she stated that she has transportation problems. Patient agreed to schedule an appointment Thursday at 3:00 which will give her time to make transportation arrangements. Patient stated that she will call back and scheduled if she can not get here for the appointment.

## 2015-05-23 ENCOUNTER — Other Ambulatory Visit: Payer: Self-pay | Admitting: Internal Medicine

## 2015-05-24 NOTE — Telephone Encounter (Signed)
Ok to fill needs as Amiodarone causes nausea

## 2015-05-25 ENCOUNTER — Encounter: Payer: Self-pay | Admitting: Family Medicine

## 2015-05-25 ENCOUNTER — Ambulatory Visit (INDEPENDENT_AMBULATORY_CARE_PROVIDER_SITE_OTHER): Payer: BLUE CROSS/BLUE SHIELD | Admitting: Family Medicine

## 2015-05-25 VITALS — BP 90/68 | HR 74 | Temp 98.1°F | Wt 247.0 lb

## 2015-05-25 DIAGNOSIS — F418 Other specified anxiety disorders: Secondary | ICD-10-CM

## 2015-05-25 DIAGNOSIS — R202 Paresthesia of skin: Secondary | ICD-10-CM | POA: Diagnosis not present

## 2015-05-25 DIAGNOSIS — E039 Hypothyroidism, unspecified: Secondary | ICD-10-CM | POA: Diagnosis not present

## 2015-05-25 MED ORDER — SERTRALINE HCL 50 MG PO TABS: 50.0000 mg | ORAL_TABLET | Freq: Every day | ORAL | Status: DC

## 2015-05-25 MED ORDER — ALPRAZOLAM 1 MG PO TABS: 1.0000 mg | ORAL_TABLET | Freq: Three times a day (TID) | ORAL | Status: DC | PRN

## 2015-05-25 NOTE — Assessment & Plan Note (Signed)
labwork due next week.

## 2015-05-25 NOTE — Progress Notes (Signed)
BP 90/68 mmHg  Pulse 74  Temp(Src) 98.1 F (36.7 C) (Oral)  Wt 247 lb (112.038 kg)  SpO2 99%   CC: lower extremity paresthesias  Subjective:    Patient ID: [name], female    DOB: Sep 20, 1971, 44 y.o.   MRN: [id number]  HPI: [name] is a 44 y.o. female presenting on 05/25/2015 for Tingling   44yo with h/o HCM s/p myomectomy, VT with pacer and defibrillator in place, chronic systolic heart failure, LBBB and HTN recently with hypotensive episodes.  She is on amiodarone and eliquis.   She also has depression with severe anxiety treated with xanax  TID PRN and sertraline  daily.   On gabapentin  TID for paresthesias ([place]). On levothyroxine daily.  Presents today for eval of L foot paresthesias. Increase in frequency of L foot paresthesias. No alcohol use. No change in meds. No other focal neurological sxs like vision changes, unilateral weakness, confusion, slurred speech.  Prior h/o finger tingling and L face tingling (1 year ago) but that has resolved. Had seen Dr Everlena Cooper for this - with normal NCV-EMG (06/2014) which we reviewed today, normal head CT. Advised she had . H/o low B12 Lab Results  Component Value Date   VITAMINB12 221 05/03/2013     Chronic lower back pain without radiculopathy. Chronic headaches.   Relevant past medical, surgical, family and social history reviewed and updated as indicated. Interim medical history since our last visit reviewed. Allergies and medications reviewed and updated. Current Outpatient Prescriptions on File Prior to Visit  Medication Sig  . acetaminophen (TYLENOL) 500 MG tablet Take 500-1,000 mg by mouth 2 (two) times daily as needed for mild pain, moderate pain, fever or headache (pain).   Marland Kitchen albuterol (PROVENTIL HFA;VENTOLIN HFA) 108 (90 BASE) MCG/ACT inhaler Inhale 1 puff into the lungs 2 (two) times daily as needed for wheezing or shortness of breath.  Marland Kitchen amiodarone (PACERONE) 200 MG tablet Take 1 tablet  by mouth twice daily Mon- Fri and 1 1/2 tablets by mouth daily Sat and Sun  . amoxicillin (AMOXIL) 500 MG capsule Take 500 mg by mouth 2 (two) times daily. DENTAL PROCEDURES ONLY  . beclomethasone (QVAR) 80 MCG/ACT inhaler Inhale 1 puff into the lungs 2 (two) times daily.  . cetirizine (ZYRTEC) 10 MG tablet Take 10 mg by mouth daily.   Marland Kitchen docusate sodium (COLACE) 100 MG capsule Take 100 mg by mouth 2 (two) times daily as needed for mild constipation.  Marland Kitchen ELIQUIS 5 MG TABS tablet TAKE ONE TABLET BY MOUTH TWICE DAILY  . furosemide (LASIX) 40 MG tablet Pt may take 1 extra tablet daily as needed for swelling (Takes an extra potassium when she does this)  . gabapentin (NEURONTIN) 300 MG capsule TAKE TWO CAPSULES BY MOUTH THREE TIMES DAILY  . levonorgestrel (MIRENA) 20 MCG/24HR IUD 1 each by Intrauterine route once.  Marland Kitchen levothyroxine (SYNTHROID, LEVOTHROID) 100 MCG tablet Take 1 tablet (100 mcg total) by mouth daily before breakfast.  . Magnesium Gluconate 250 MG TABS Take 500 mg by mouth 2 (two) times daily.  . metoprolol succinate (TOPROL-XL) 25 MG 24 hr tablet Take 3 tablets (75 mg total) by mouth 2 (two) times daily.  Marland Kitchen mexiletine (MEXITIL) 150 MG capsule TAKE TWO CAPSULES BY MOUTH THREE TIMES DAILY  . mexiletine (MEXITIL) 150 MG capsule Take 2 capsules (300 mg total) by mouth 3 (three) times daily.  . Olopatadine HCl (PATANASE) 0.6 % SOLN Place 1 puff into the nose  daily as needed (for allergies).   Marland Kitchen omeprazole (PRILOSEC) 40 MG capsule Take 1 capsule (40 mg total) by mouth daily. For 3 weeks then as needed  . ondansetron (ZOFRAN) 4 MG tablet TAKE ONE TABLET BY MOUTH EVERY 6 HOURS AS NEEDED FOR NAUSEA OR VOMITING  . oxyCODONE-acetaminophen (PERCOCET) 7.5-325 MG per tablet Take 1 tablet by mouth every 4 (four) hours as needed (pain).   . Potassium Gluconate 595 MG CAPS Take 1 capsule (595 mg total) by mouth daily. OK to take extra on days you need extra lasix  . ranolazine (RANEXA) 1000 MG SR tablet  Take 1 tablet (1,000 mg total) by mouth 2 (two) times daily.   No current facility-administered medications on file prior to visit.    Review of Systems Per HPI unless specifically indicated above     Objective:    BP 90/68 mmHg  Pulse 74  Temp(Src) 98.1 F (36.7 C) (Oral)  Wt 247 lb (112.038 kg)  SpO2 99%  Wt Readings from Last 3 Encounters:  05/25/15 247 lb (112.038 kg)  04/11/15 234 lb 12.8 oz (106.505 kg)  03/30/15 233 lb 9.6 oz (105.96 kg)   Body mass index is 48.24 kg/(m^2).  Physical Exam  Constitutional: She appears well-developed and well-nourished. No distress.  HENT:  Mouth/Throat: Oropharynx is clear and moist. No oropharyngeal exudate.  Cardiovascular: Normal rate, regular rhythm and intact distal pulses.   Murmur heard. Pulmonary/Chest: Effort normal and breath sounds normal. No respiratory distress. She has no wheezes. She has no rales.  Musculoskeletal: She exhibits no edema.  Sensation intact to light touch and monofilament 1+ DP bilaterally No pain/swelling appreciated Maceration between toes bilaterally  Skin: Skin is warm and dry. Rash noted.  Healing bug bites BLE  Nursing note and vitals reviewed.  Results for orders placed or performed in visit on 05/02/15  TSH  Result Value Ref Range   TSH 14.72 (H) 0.35 - 4.50 uIU/mL      Assessment & Plan:   Problem List Items Addressed This Visit    Depression with anxiety (Chronic)    Overall stable on sertraline. Requests increase in # pills /month as she has noticed increased anxiety despite sertraline 50mg  daily. Will increase #/mo to 80 temporarily and then reassess. Pt agrees with plan.      Hypothyroidism (Chronic)    labwork due next week.      Severe obesity (BMI >= 40)    Recommended mediterranean diet, handout provided.       Paresthesia of left foot - Primary    Isolated currently to L foot - breakthrough sxs despite gabapentin 600mg  TID. Reviewed eval from last year - with overall  normal NCS/EMG, head CT. No other focal neurological sxs appreciated. Will check labwork for other causes of paresthesias when returns next week for thyroid check (B12, glu, folate). Pt agrees with plan.      Relevant Orders   Vitamin B12   Folate   Basic metabolic panel   CBC with Differential/Platelet       Follow up plan: Return if symptoms worsen or fail to improve.

## 2015-05-25 NOTE — Assessment & Plan Note (Addendum)
Recommended mediterranean diet, handout provided.

## 2015-05-25 NOTE — Assessment & Plan Note (Signed)
Overall stable on sertraline. Requests increase in # pills /month as she has noticed increased anxiety despite sertraline  daily. Will increase #/mo to 80 temporarily and then reassess. Pt agrees with plan.

## 2015-05-25 NOTE — Patient Instructions (Addendum)
Try biotin for hair and nail growth. We will check labwork when you return for tingling in left foot. Let's increase xanax to #80 per month - hopeful for just temporary course. Let me know if you'd like to try higher sertraline dose.      Mediterranean Diet  Why follow it? Research shows. . Those who follow the Mediterranean diet have a reduced risk of heart disease  . The diet is associated with a reduced incidence of Parkinson's and Alzheimer's diseases . People following the diet may have longer life expectancies and lower rates of chronic diseases  . The Dietary Guidelines for Americans recommends the Mediterranean diet as an eating plan to promote health and prevent disease  What Is the Mediterranean Diet?  . Healthy eating plan based on typical foods and recipes of Mediterranean-style cooking . The diet is primarily a plant based diet; these foods should make up a majority of meals   Starches - Plant based foods should make up a majority of meals - They are an important sources of vitamins, minerals, energy, antioxidants, and fiber - Choose whole grains, foods high in fiber and minimally processed items  - Typical grain sources include wheat, oats, barley, corn, brown rice, bulgar, farro, millet, polenta, couscous  - Various types of beans include chickpeas, lentils, fava beans, black beans, white beans   Fruits  Veggies - Large quantities of antioxidant rich fruits & veggies; 6 or more servings  - Vegetables can be eaten raw or lightly drizzled with oil and cooked  - Vegetables common to the traditional Mediterranean Diet include: artichokes, arugula, beets, broccoli, brussel sprouts, cabbage, carrots, celery, collard greens, cucumbers, eggplant, kale, leeks, lemons, lettuce, mushrooms, okra, onions, peas, peppers, potatoes, pumpkin, radishes, rutabaga, shallots, spinach, sweet potatoes, turnips, zucchini - Fruits common to the Mediterranean Diet include: apples, apricots, avocados,  cherries, clementines, dates, figs, grapefruits, grapes, melons, nectarines, oranges, peaches, pears, pomegranates, strawberries, tangerines  Fats - Replace butter and margarine with healthy oils, such as olive oil, canola oil, and tahini  - Limit nuts to no more than a handful a day  - Nuts include walnuts, almonds, pecans, pistachios, pine nuts  - Limit or avoid candied, honey roasted or heavily salted nuts - Olives are central to the [employer] - can be eaten whole or used in a variety of dishes   Meats Protein - Limiting red meat: no more than a few times a month - When eating red meat: choose lean cuts and keep the portion to the size of deck of cards - Eggs: approx. 0 to 4 times a week  - Fish and lean poultry: at least 2 a week  - Healthy protein sources include, chicken, [place], lean beef, lamb - Increase intake of seafood such as tuna, salmon, trout, mackerel, shrimp, scallops - Avoid or limit high fat processed meats such as sausage and bacon  Dairy - Include moderate amounts of low fat dairy products  - Focus on healthy dairy such as fat free yogurt, skim milk, low or reduced fat cheese - Limit dairy products higher in fat such as whole or 2% milk, cheese, ice cream  Alcohol - Moderate amounts of red wine is ok  - No more than 5 oz daily for women (all ages) and men older than age 70  - No more than 10 oz of wine daily for men younger than 14  Other - Limit sweets and other desserts  - Use herbs and spices instead of salt  to flavor foods  - Herbs and spices common to the traditional Mediterranean Diet include: basil, bay leaves, chives, cloves, cumin, fennel, garlic, lavender, marjoram, mint, oregano, parsley, pepper, rosemary, sage, savory, sumac, tarragon, thyme   It's not just a diet, it's a lifestyle:  . The Mediterranean diet includes lifestyle factors typical of those in the region  . Foods, drinks and meals are best eaten with others and savored . Daily physical  activity is important for overall good health . This could be strenuous exercise like running and aerobics . This could also be more leisurely activities such as walking, housework, yard-work, or taking the stairs . Moderation is the key; a balanced and healthy diet accommodates most foods and drinks . Consider portion sizes and frequency of consumption of certain foods   Meal Ideas & Options:  . Breakfast:  o Whole wheat toast or whole wheat English muffins with peanut butter & hard boiled egg o Steel cut oats topped with apples & cinnamon and skim milk  o Fresh fruit: banana, strawberries, melon, berries, peaches  o Smoothies: strawberries, bananas, greek yogurt, peanut butter o Low fat greek yogurt with blueberries and granola  o Egg white omelet with spinach and mushrooms o Breakfast couscous: whole wheat couscous, apricots, skim milk, cranberries  . Sandwiches:  o Hummus and grilled vegetables (peppers, zucchini, squash) on whole wheat bread   o Grilled chicken on whole wheat pita with lettuce, tomatoes, cucumbers or tzatziki  o Tuna salad on whole wheat bread: tuna salad made with greek yogurt, olives, red peppers, capers, green onions o Garlic rosemary lamb pita: lamb sauted with garlic, rosemary, salt & pepper; add lettuce, cucumber, greek yogurt to pita - flavor with lemon juice and black pepper  . Seafood:  o Mediterranean grilled salmon, seasoned with garlic, basil, parsley, lemon juice and black pepper o Shrimp, lemon, and spinach whole-grain pasta salad made with low fat greek yogurt  o Seared scallops with lemon orzo  o Seared tuna steaks seasoned salt, pepper, coriander topped with tomato mixture of olives, tomatoes, olive oil, minced garlic, parsley, green onions and cappers  . Meats:  o Herbed greek chicken salad with kalamata olives, cucumber, feta  o Red bell peppers stuffed with spinach, bulgur, lean ground beef (or lentils) & topped with feta   o Kebabs: skewers of  chicken, tomatoes, onions, zucchini, squash  o [place] burgers: made with red onions, mint, dill, lemon juice, feta cheese topped with roasted red peppers . Vegetarian o Cucumber salad: cucumbers, artichoke hearts, celery, red onion, feta cheese, tossed in olive oil & lemon juice  o Hummus and whole grain pita points with a greek salad (lettuce, tomato, feta, olives, cucumbers, red onion) o Lentil soup with celery, carrots made with vegetable broth, garlic, salt and pepper  o Tabouli salad: parsley, bulgur, mint, scallions, cucumbers, tomato, radishes, lemon juice, olive oil, salt and pepper

## 2015-05-25 NOTE — Progress Notes (Signed)
Pre visit review using our clinic review tool, if applicable. No additional management support is needed unless otherwise documented below in the visit note. 

## 2015-05-25 NOTE — Assessment & Plan Note (Signed)
Isolated currently to L foot - breakthrough sxs despite gabapentin  TID. Reviewed eval from last year - with overall normal NCS/EMG, head CT. No other focal neurological sxs appreciated. Will check labwork for other causes of paresthesias when returns next week for thyroid check (B12, glu, folate). Pt agrees with plan.

## 2015-06-13 ENCOUNTER — Other Ambulatory Visit: Payer: Self-pay | Admitting: Neurology

## 2015-06-14 ENCOUNTER — Encounter: Payer: Self-pay | Admitting: Internal Medicine

## 2015-06-14 ENCOUNTER — Ambulatory Visit (INDEPENDENT_AMBULATORY_CARE_PROVIDER_SITE_OTHER): Payer: Medicare Other | Admitting: Internal Medicine

## 2015-06-14 VITALS — BP 112/60 | HR 75 | Ht 60.0 in | Wt 245.0 lb

## 2015-06-14 DIAGNOSIS — I4901 Ventricular fibrillation: Secondary | ICD-10-CM

## 2015-06-14 DIAGNOSIS — I472 Ventricular tachycardia, unspecified: Secondary | ICD-10-CM

## 2015-06-14 DIAGNOSIS — I422 Other hypertrophic cardiomyopathy: Secondary | ICD-10-CM | POA: Diagnosis not present

## 2015-06-14 DIAGNOSIS — I5042 Chronic combined systolic (congestive) and diastolic (congestive) heart failure: Secondary | ICD-10-CM

## 2015-06-14 DIAGNOSIS — Z95810 Presence of automatic (implantable) cardiac defibrillator: Secondary | ICD-10-CM

## 2015-06-14 DIAGNOSIS — R112 Nausea with vomiting, unspecified: Secondary | ICD-10-CM

## 2015-06-14 DIAGNOSIS — I4892 Unspecified atrial flutter: Secondary | ICD-10-CM

## 2015-06-14 LAB — CUP PACEART INCLINIC DEVICE CHECK
Brady Statistic RV Percent Paced: 1 % — CL
HighPow Impedance: 41 Ohm
HighPow Impedance: 52 Ohm
Lead Channel Impedance Value: 595 Ohm
Lead Channel Pacing Threshold Amplitude: 1 V
Lead Channel Sensing Intrinsic Amplitude: 14.6 mV
Lead Channel Sensing Intrinsic Amplitude: 6 mV
Lead Channel Setting Pacing Amplitude: 2 V
MDC IDC MSMT LEADCHNL RA PACING THRESHOLD PULSEWIDTH: 0.5 ms
MDC IDC MSMT LEADCHNL RV IMPEDANCE VALUE: 500 Ohm
MDC IDC MSMT LEADCHNL RV PACING THRESHOLD AMPLITUDE: 1.3 V
MDC IDC MSMT LEADCHNL RV PACING THRESHOLD PULSEWIDTH: 0.5 ms
MDC IDC PG SERIAL: 102591
MDC IDC SESS DTM: 20160914040000
MDC IDC SET LEADCHNL RV PACING AMPLITUDE: 2.4 V
MDC IDC SET LEADCHNL RV PACING PULSEWIDTH: 0.5 ms
MDC IDC SET LEADCHNL RV SENSING SENSITIVITY: 0.6 mV
MDC IDC SET ZONE DETECTION INTERVAL: 250 ms
MDC IDC STAT BRADY RA PERCENT PACED: 100 %
Zone Setting Detection Interval: 286 ms

## 2015-06-14 MED ORDER — AMIODARONE HCL 200 MG PO TABS: ORAL_TABLET | ORAL | Status: DC

## 2015-06-14 MED ORDER — MEXILETINE HCL 150 MG PO CAPS: 150.0000 mg | ORAL_CAPSULE | Freq: Three times a day (TID) | ORAL | Status: AC

## 2015-06-14 NOTE — Assessment & Plan Note (Signed)
Her symptoms are class 2. Will follow.

## 2015-06-14 NOTE — Patient Instructions (Addendum)
Medication Instructions:  Your physician has recommended you make the following change in your medication:  1. CHANGE Amiodarone to  daily (two  tablets) on Monday, Tuesday, Wednesday and Thursday and Amiodarone  daily on Friday, Saturday and Sunday 2. DECREASE Mexiletine  to one tablet by mouth three times per day  Labwork: No new orders.   Testing/Procedures: No new orders.  Follow-Up: Your physician wants you to follow-up in: 4 MONTHS with Dr Ladona Ridgel.  You will receive a reminder letter in the mail two months in advance. If you don't receive a letter, please call our office to schedule the follow-up appointment.  Per Dr Ladona Ridgel you are okay to resume driving at this time.   Any Other Special Instructions Will Be Listed Below (If Applicable).

## 2015-06-14 NOTE — Assessment & Plan Note (Signed)
She is maintaining NSR. No change in meds. She will continue her anti-coagulation.

## 2015-06-14 NOTE — Assessment & Plan Note (Signed)
She has done well with her amiodarone. Nausea is well controlled. We have started weaning her dose. She is instructed to take 400 mg daily Mon. Thru Thursday and 200 mg Friday through Sunday. Also, I have asked her to reduce her mexilitine to 150 tid.

## 2015-06-14 NOTE — Assessment & Plan Note (Signed)
This has stabilized. She will continue her current meds.

## 2015-06-14 NOTE — Progress Notes (Signed)
HPI [name] returns today for followup. She is a pleasant 44 yo woman with multiple medical problems including HCM, s/p myomectomy, VT, chronic systolic heart failure, LBBB, and HTN. The patient has done well since starting amio and zofran. She has gone over 6 months since her ventricular arrhythmias and has tolerated her amiodarone. No other complaints today except for easy bruisability on eliquis.  Allergies  Allergen Reactions  . Amiodarone Nausea And Vomiting    Amiodarone tablets cause nausea (unless taking Zofran) Can tolerate IV Amiodarone  . Phenergan [Promethazine Hcl] Itching  . Warfarin And Related Other (See Comments)    'burns my skin'  . Nitroglycerin Other (See Comments)    Hypotension     Current Outpatient Prescriptions  Medication Sig Dispense Refill  . acetaminophen (TYLENOL) 500 MG tablet Take 500-1,000 mg by mouth 2 (two) times daily as needed for mild pain, moderate pain, fever or headache (pain).     Marland Kitchen albuterol (PROVENTIL HFA;VENTOLIN HFA) 108 (90 BASE) MCG/ACT inhaler Inhale 1 puff into the lungs 2 (two) times daily as needed for wheezing or shortness of breath. 1 Inhaler 3  . ALPRAZolam (XANAX) 1 MG tablet Take 1 tablet (1 mg total) by mouth 3 (three) times daily as needed. 80 tablet 0  . amiodarone (PACERONE) 200 MG tablet Take 1 tablet by mouth twice daily Mon- Fri and 1 1/2 tablets by mouth daily Sat and Sun    . amoxicillin (AMOXIL) 500 MG capsule Take 500 mg by mouth 2 (two) times daily. DENTAL PROCEDURES ONLY  1  . beclomethasone (QVAR) 80 MCG/ACT inhaler Inhale 1 puff into the lungs 2 (two) times daily.    . cetirizine (ZYRTEC) 10 MG tablet Take 10 mg by mouth daily.     Marland Kitchen docusate sodium (COLACE) 100 MG capsule Take 100 mg by mouth 2 (two) times daily as needed for mild constipation.    Marland Kitchen ELIQUIS 5 MG TABS tablet TAKE ONE TABLET BY MOUTH TWICE DAILY 60 tablet 5  . furosemide (LASIX) 40 MG tablet Pt may take 1 extra tablet daily as needed for  swelling (Takes an extra potassium when she does this) 180 tablet 3  . gabapentin (NEURONTIN) 300 MG capsule TAKE TWO CAPSULES BY MOUTH THREE TIMES DAILY 180 capsule 0  . levonorgestrel (MIRENA) 20 MCG/24HR IUD 1 each by Intrauterine route once.    Marland Kitchen levothyroxine (SYNTHROID, LEVOTHROID) 100 MCG tablet Take 1 tablet (100 mcg total) by mouth daily before breakfast. 30 tablet 9  . Magnesium Gluconate 250 MG TABS Take 500 mg by mouth 2 (two) times daily.    . metoprolol succinate (TOPROL-XL) 25 MG 24 hr tablet Take 3 tablets (75 mg total) by mouth 2 (two) times daily. 180 tablet 11  . mexiletine (MEXITIL) 150 MG capsule TAKE TWO CAPSULES BY MOUTH THREE TIMES DAILY 180 capsule 5  . mexiletine (MEXITIL) 150 MG capsule Take 2 capsules (300 mg total) by mouth 3 (three) times daily. 540 capsule 3  . Olopatadine HCl (PATANASE) 0.6 % SOLN Place 1 puff into the nose daily as needed (for allergies).     Marland Kitchen omeprazole (PRILOSEC) 40 MG capsule Take 1 capsule (40 mg total) by mouth daily. For 3 weeks then as needed 30 capsule 1  . ondansetron (ZOFRAN) 4 MG tablet TAKE ONE TABLET BY MOUTH EVERY 6 HOURS AS NEEDED FOR NAUSEA OR VOMITING 30 tablet 1  . oxyCODONE-acetaminophen (PERCOCET) 7.5-325 MG per tablet Take 1 tablet by mouth every  4 (four) hours as needed (pain).   0  . Potassium Gluconate 595 MG CAPS Take 1 capsule (595 mg total) by mouth daily. OK to take extra on days you need extra lasix 100 capsule 3  . ranolazine (RANEXA) 1000 MG SR tablet Take 1 tablet (1,000 mg total) by mouth 2 (two) times daily. 180 tablet 3  . sertraline (ZOLOFT) 50 MG tablet Take 1 tablet (50 mg total) by mouth daily. 30 tablet 6   No current facility-administered medications for this visit.     Past Medical History  Diagnosis Date  . Hypertrophic cardiomyopathy     s/p septal myomectomy with implantable defibrillator 01/27/2012   . Anxiety state, unspecified   . Allergic rhinitis     to dogs  . Asthma   . Anemia     during  pregnancy  . History of chicken pox   . Generalized headaches   . Ocular migraine     no HA  . Back pain     told cracked bone, vicodin didn't help, improved with percocets/muscle relaxants, no eval by prior PCP  . 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 DVT (deep venous thrombosis) 01/2012    was placed on Warfarin after cardiac surgery but had skin reaction to warfarin so was changed to Pradaxa so DVT occurred while on Pradaxa although unclear if it occurred during transition  . Hypotension   . Atrial fibrillation     a. On pradaxa.  coumadin necrosis with warfarin;  b. Amio d/c'd 02/2012  . Pericardial effusion   . Chronic combined systolic and diastolic CHF (congestive heart failure)     a. echo 03/19/12: severe asymmetric septal hypertrophy, evidence of upper septal myomectomy, peak LV outflow tract gradient 35, EF 35%,;  b.  Echo 9/13: Severe LVH, EF 30-35%, anteroseptal and inferior HK, LVOT narrow, mild AI, mild to moderate mitral stenosis, severe LAE  . Pericardial tamponade 03/14/2012  . 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 01/2012 s/p BSX ICD;  b. 06/2012 Multaq initiated due to recurrent syncope, VT/VF - had severe n/v with IV amio.  . Skin necrosis --COUMADIN   . Chronic chest wall pain     eval by CVTS, stable, rec return to [facility] if continued pain  . Syncope     a. in setting of VT/VF.  Marland Kitchen Hypothyroid   . Blood transfusion without reported diagnosis   . Neuromuscular disorder   . Gall stones   . Atrial flutter     a. 10/2013 with inappropriate ICD therapy; b. s/p CTI ablation by Dr Ladona Ridgel 11/16/2013;  c. 11/2013 syncope 2/2 aflutter/VT->failed DCCV->amio resumed w/ zofran for nausea.  Marland Kitchen PONV (postoperative nausea and vomiting)   . Depression   . Post-traumatic stress   . Fibromyalgia   . Hx of echocardiogram     Echo (2/16): mod LVH, EF 55-60%, no RWMA, Gr 2 DD, mild AI, mild to mod MS, mod LAE    ROS:   All  systems reviewed and negative except as noted in the HPI.   Past Surgical History  Procedure Laterality Date  . Cesarean section  1991  . Induced abortion  1990 and 1993  . Cardiac surgery  01/27/2012    median sternotomy, septal myectomy, MVR - [facility] (Dr. Silvestre Mesi)  . Cardiac defibrillator placement  01/2012    BSX dual chamber ICD  . Myomectomy    . Pericardial  window  03/14/2012    Procedure: PERICARDIAL WINDOW;  Surgeon: Purcell Nails, MD;  Location: [facility] OR;  Service: Open Heart Surgery;  Laterality: N/A;  Subxyphoid Pericardial  Window   . Ablation  11-16-13    RFCA of atrial flutter by Dr Ladona Ridgel   . Insert / replace / remove pacemaker    . Mitral valve repair    . Cholecystectomy N/A 01/26/2014    LAPAROSCOPIC CHOLECYSTECTOMY WITH INTRAOPERATIVE CHOLANGIOGRAM;  Surgeon: Wilmon Arms. Tsuei, MD  . Atrial flutter ablation N/A 11/16/2013    Procedure: ATRIAL FLUTTER ABLATION;  Surgeon: Marinus Maw, MD;  Location[facility];  Service: Cardiovascular;  Laterality: N/A;  . Cardioversion N/A 12/13/2014    Procedure: CARDIOVERSION;  Surgeon: Wendall Stade, MD;  Location: [facility] ENDOSCOPY;  Service: Cardiovascular;  Laterality: N/A;     Family History  Problem Relation Age of Onset  . Diabetes Father   . Heart disease Father     unspecified heart problem  . Thyroid disease Mother   . Anemia Mother     mediterranean anemia, ITP  . Heart disease Maternal Grandfather     CHF  . Hyperlipidemia Maternal Grandfather   . Diabetes Paternal Grandfather   . Cancer Paternal Grandfather   . Coronary artery disease Neg Hx   . Stroke Father   . Sudden death Neg Hx   . Heart attack Neg Hx      Social History   Social History  . Marital Status: Single    Spouse Name: N/A  . Number of Children: 1  . Years of Education: N/A   Occupational History  . Naval architect    Social History Main Topics  . Smoking status: Former Smoker -- 1.00 packs/day for 3 years    Types: Cigarettes     Quit date: 09/30/1988  . Smokeless tobacco: Never Used     Comment: quitin 1990  . Alcohol Use: No     Comment: none since "months" as of 02/28/2013  . Drug Use: No     Comment: quiti n 1989  . Sexual Activity: Yes    Birth Control/ Protection: Pill   Other Topics Concern  . Not on file   Social History Narrative   Caffeine: 1 cup coffee/day   Divorced, 1 child.  lives with boyfriend, dogs   Occupation: Full time Tax adviser   Activity: walks 11min/day   Diet: fruits/vegetables daily, water, no fish     BP 112/60 mmHg  Pulse 75  Ht 5' (1.524 m)  Wt 245 lb (111.131 kg)  BMI 47.85 kg/m2  SpO2 96%  Physical Exam:  stable appearing obese, middle aged woman, NAD HEENT: Unremarkable Neck:  6 cm JVD Back:  No CVA tenderness Lungs:  Clear with no wheezes HEART:  Regular rate rhythm, no murmurs, no rubs, no clicks Abd:  soft, positive bowel sounds, no organomegally, no rebound, no guarding Ext:  2 plus pulses, trace peripheral edema, no cyanosis, no clubbing Skin:  No rashes no nodules Neuro:  CN II through XII intact, motor grossly intact   DEVICE  Normal device function.  See PaceArt for details.   Assess/Plan:

## 2015-06-14 NOTE — Assessment & Plan Note (Signed)
Her [employer] ICD is working normally. Will recheck in several months.

## 2015-06-19 ENCOUNTER — Other Ambulatory Visit (INDEPENDENT_AMBULATORY_CARE_PROVIDER_SITE_OTHER): Payer: Medicare Other

## 2015-06-19 ENCOUNTER — Telehealth: Payer: Self-pay | Admitting: *Deleted

## 2015-06-19 DIAGNOSIS — R202 Paresthesia of skin: Secondary | ICD-10-CM

## 2015-06-19 DIAGNOSIS — E0590 Thyrotoxicosis, unspecified without thyrotoxic crisis or storm: Secondary | ICD-10-CM

## 2015-06-19 DIAGNOSIS — E039 Hypothyroidism, unspecified: Secondary | ICD-10-CM

## 2015-06-19 LAB — CBC WITH DIFFERENTIAL/PLATELET
Basophils Absolute: 0 10*3/uL (ref 0.0–0.1)
Basophils Relative: 0.2 % (ref 0.0–3.0)
EOS PCT: 3.1 % (ref 0.0–5.0)
Eosinophils Absolute: 0.2 10*3/uL (ref 0.0–0.7)
HEMATOCRIT: 43.1 % (ref 36.0–46.0)
HEMOGLOBIN: 14.4 g/dL (ref 12.0–15.0)
LYMPHS PCT: 14.5 % (ref 12.0–46.0)
Lymphs Abs: 0.9 10*3/uL (ref 0.7–4.0)
MCHC: 33.4 g/dL (ref 30.0–36.0)
MCV: 95.3 fl (ref 78.0–100.0)
MONO ABS: 0.7 10*3/uL (ref 0.1–1.0)
Monocytes Relative: 11.8 % (ref 3.0–12.0)
Neutro Abs: 4.4 10*3/uL (ref 1.4–7.7)
Neutrophils Relative %: 70.4 % (ref 43.0–77.0)
Platelets: 262 10*3/uL (ref 150.0–400.0)
RBC: 4.52 Mil/uL (ref 3.87–5.11)
RDW: 13.8 % (ref 11.5–15.5)
WBC: 6.3 10*3/uL (ref 4.0–10.5)

## 2015-06-19 LAB — BASIC METABOLIC PANEL
BUN: 18 mg/dL (ref 6–23)
CHLORIDE: 99 meq/L (ref 96–112)
CO2: 29 meq/L (ref 19–32)
CREATININE: 0.95 mg/dL (ref 0.40–1.20)
Calcium: 9 mg/dL (ref 8.4–10.5)
GFR: 67.7 mL/min (ref >60.00)
Glucose, Bld: 106 mg/dL — ABNORMAL HIGH (ref 70–99)
Potassium: 4 mEq/L (ref 3.5–5.1)
SODIUM: 136 meq/L (ref 135–145)

## 2015-06-19 LAB — TSH: TSH: 10.78 u[IU]/mL — AB (ref 0.35–4.50)

## 2015-06-19 LAB — VITAMIN B12: Vitamin B-12: 478 pg/mL (ref 211–911)

## 2015-06-19 LAB — FOLATE: FOLATE: 15.4 ng/mL (ref >5.9)

## 2015-06-19 NOTE — Telephone Encounter (Signed)
Lmptcb to go over lab results 

## 2015-06-20 MED ORDER — LEVOTHYROXINE SODIUM 125 MCG PO TABS: 125.0000 ug | ORAL_TABLET | Freq: Every day | ORAL | Status: DC

## 2015-06-20 NOTE — Telephone Encounter (Signed)
lmptcb on daughter Paac [place] phone DPR on file; could not lmom today on pt's phone.

## 2015-06-20 NOTE — Telephone Encounter (Signed)
Pt notified of lab results and to increase synthroid to 125 mcg daily; new Rx sent in, TSH, FREE T4 10/24. Pt agreeable to plan of care.

## 2015-06-20 NOTE — Telephone Encounter (Signed)
Follow up ° ° °Pt returning your call °

## 2015-06-22 ENCOUNTER — Telehealth: Payer: Self-pay | Admitting: Family Medicine

## 2015-06-22 NOTE — Telephone Encounter (Signed)
Spoke with patient.

## 2015-06-22 NOTE — Telephone Encounter (Signed)
Pt states that she is returning your call. Please call back at [phone] thanks

## 2015-06-23 ENCOUNTER — Other Ambulatory Visit: Payer: Self-pay | Admitting: Family Medicine

## 2015-06-23 DIAGNOSIS — R202 Paresthesia of skin: Secondary | ICD-10-CM

## 2015-06-27 ENCOUNTER — Telehealth: Payer: Self-pay | Admitting: *Deleted

## 2015-06-27 ENCOUNTER — Ambulatory Visit: Payer: Medicare Other | Admitting: Neurology

## 2015-06-27 NOTE — Telephone Encounter (Signed)
Patient has been seeing the pain clinic, most recent appointment was this morning.  She was advised that it was not recommended for her to be taking alprazolam while on opiates (for pain), and they they would no longer fill her oxycodone while the alprazolam was in her system.  Patient states she is taking alprazolam for PTSD and anxiety.  She has concerns with stopping this and would like to discuss other options, if possible.  Please advise.

## 2015-06-27 NOTE — Telephone Encounter (Signed)
Spoke with patient. She said she has been on the xanax for a minimum of 2 years. Looking in her chart-that's how long you have prescribed it for her, but she was getting it from Dr. Glenis Smoker prior to transferring to you. She sees [facility]; She has seen Dr. Tollie Eth and Dr. Nilsa Nutting. Dr. Nilsa Nutting is the one who said she couldn't have the xanax and opiates. [phone]

## 2015-06-27 NOTE — Telephone Encounter (Signed)
What pain clinic does she go to and who is MD? How long has she been on xanax? I'd like to touch base with him. Can we get their contact #?

## 2015-06-29 NOTE — Telephone Encounter (Signed)
Placed a call to Dr Nilsa Nutting, left message with front office - they think he will return call today.

## 2015-06-30 ENCOUNTER — Telehealth: Payer: Self-pay | Admitting: Family Medicine

## 2015-06-30 ENCOUNTER — Encounter: Payer: Self-pay | Admitting: Neurology

## 2015-06-30 ENCOUNTER — Ambulatory Visit (INDEPENDENT_AMBULATORY_CARE_PROVIDER_SITE_OTHER): Payer: Medicare Other | Admitting: Neurology

## 2015-06-30 ENCOUNTER — Telehealth: Payer: Self-pay | Admitting: Cardiovascular Disease

## 2015-06-30 VITALS — BP 110/88 | HR 78 | Ht 60.0 in | Wt 253.0 lb

## 2015-06-30 DIAGNOSIS — R202 Paresthesia of skin: Secondary | ICD-10-CM

## 2015-06-30 DIAGNOSIS — R52 Pain, unspecified: Secondary | ICD-10-CM | POA: Diagnosis not present

## 2015-06-30 DIAGNOSIS — R200 Anesthesia of skin: Secondary | ICD-10-CM

## 2015-06-30 NOTE — Telephone Encounter (Signed)
New problem   Pt stated she is gaining weight and is taking extra Lasix and isn't helping Please call pt.

## 2015-06-30 NOTE — Progress Notes (Signed)
NEUROLOGY FOLLOW UP OFFICE NOTE  [name] [id number]  HISTORY OF PRESENT ILLNESS: [name] is a 44 year old woman with HOCM s/p AICD who follows up for episode of numbness and tingling.  Records personally reviewed.   UPDATE: NCV-EMG of left upper and lower extremities performed on 07/11/14 was normal.  Symptoms resolved for a while but they returned about 3 weeks ago.  She again notes numbness and tingling in the face, arms, legs, as well as pain in the wrists and hands, as well as spasms in the legs.  She takes gabapentin.  HISTORY: She presented to the ED on 04/19/13 after developing numbness of the head, arms and hands.  She described pins and needles sensation, intermittently, involving both feet/toes and hands/fingers, as well as left side of face.  She also reported burning on the bottom of her left foot. She denies sudomotor dysfunction.  She does have sometimes constipation and diarrhea.  She has associated chronic headache, described as a humming in her head.  She also notes lightheadedness, which she attributes to medication changes.  She denies visual disturbance. She also complained of chest pain as well.  She denied visual disturbance or focal weakness.  She had headache, which is chronic for several months.  She also reported lightheadedness, which she attributes to having started a new heart medication.  ECG revealed a paced rhythm of 76 bpm with LBBB and non-specific T-wave changes.  CT of the brain was unremarkable.  Lyme was positive but confirmatory test was negative.  B12 level was 221 but methylmalonic acid level was normal. Sjogren's antibodies negatative, SPEP revealed nonspecific diffuse polyclonal type increase in gamma globulins but IFE was normal.  ANA negative, ESR 13, B1 18, E 15, B6 8.3.  2hr glucose tolerance test 157.  She notes some lightheadedness but no syncope.  She denies dry eyes or dry mouth.   She takes gabapentin 354m three times daily.  PAST  MEDICAL HISTORY: Past Medical History  Diagnosis Date  . Hypertrophic cardiomyopathy     s/p septal myomectomy with implantable defibrillator 01/27/2012   . Anxiety state, unspecified   . Allergic rhinitis     to dogs  . Asthma   . Anemia     during pregnancy  . History of chicken pox   . Generalized headaches   . Ocular migraine     no HA  . Back pain     told cracked bone, vicodin didn't help, improved with percocets/muscle relaxants, no eval by prior PCP  . 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 DVT (deep venous thrombosis) 01/2012    was placed on Warfarin after cardiac surgery but had skin reaction to warfarin so was changed to Pradaxa so DVT occurred while on Pradaxa although unclear if it occurred during transition  . Hypotension   . Atrial fibrillation     a. On pradaxa.  coumadin necrosis with warfarin;  b. Amio d/c'd 02/2012  . Pericardial effusion   . Chronic combined systolic and diastolic CHF (congestive heart failure)     a. echo 03/19/12: severe asymmetric septal hypertrophy, evidence of upper septal myomectomy, peak LV outflow tract gradient 35, EF 35%,;  b.  Echo 9/13: Severe LVH, EF 30-35%, anteroseptal and inferior HK, LVOT narrow, mild AI, mild to moderate mitral stenosis, severe LAE  . Pericardial tamponade 03/14/2012  . 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 01/2012  s/p BSX ICD;  b. 06/2012 Multaq initiated due to recurrent syncope, VT/VF - had severe n/v with IV amio.  . Skin necrosis --COUMADIN   . Chronic chest wall pain     eval by CVTS, stable, rec return to [facility] if continued pain  . Syncope     a. in setting of VT/VF.  Marland Kitchen Hypothyroid   . Blood transfusion without reported diagnosis   . Neuromuscular disorder   . Gall stones   . Atrial flutter     a. 10/2013 with inappropriate ICD therapy; b. s/p CTI ablation by Dr Lovena Le 11/16/2013;  c. 11/2013 syncope 2/2 aflutter/VT->failed DCCV->amio resumed w/  zofran for nausea.  Marland Kitchen PONV (postoperative nausea and vomiting)   . Depression   . Post-traumatic stress   . Fibromyalgia   . Hx of echocardiogram     Echo (2/16): mod LVH, EF 55-60%, no RWMA, Gr 2 DD, mild AI, mild to mod MS, mod LAE    MEDICATIONS: Current Outpatient Prescriptions on File Prior to Visit  Medication Sig Dispense Refill  . acetaminophen (TYLENOL) 500 MG tablet Take 500-1,000 mg by mouth 2 (two) times daily as needed for mild pain, moderate pain, fever or headache (pain).     Marland Kitchen albuterol (PROVENTIL HFA;VENTOLIN HFA) 108 (90 BASE) MCG/ACT inhaler Inhale 1 puff into the lungs 2 (two) times daily as needed for wheezing or shortness of breath. 1 Inhaler 3  . ALPRAZolam (XANAX) 1 MG tablet Take 1 tablet (1 mg total) by mouth 3 (three) times daily as needed. 80 tablet 0  . amiodarone (PACERONE) 200 MG tablet $RemoveB'400mg'eznXiCiZ$  daily (two $RemoveBe'200mg'jwhgRUykk$  tablets) on Monday, Tuesday, Wednesday and Thursday and Amiodarone $RemoveBefore'200mg'jYhiIDoTObBlK$  daily on Friday, Saturday and Sunday 135 tablet 3  . amoxicillin (AMOXIL) 500 MG capsule Take 500 mg by mouth 2 (two) times daily. DENTAL PROCEDURES ONLY  1  . beclomethasone (QVAR) 80 MCG/ACT inhaler Inhale 1 puff into the lungs 2 (two) times daily.    . cetirizine (ZYRTEC) 10 MG tablet Take 10 mg by mouth daily.     Marland Kitchen docusate sodium (COLACE) 100 MG capsule Take 100 mg by mouth 2 (two) times daily as needed for mild constipation.    Marland Kitchen ELIQUIS 5 MG TABS tablet TAKE ONE TABLET BY MOUTH TWICE DAILY 60 tablet 5  . furosemide (LASIX) 40 MG tablet Pt may take 1 extra tablet daily as needed for swelling (Takes an extra potassium when she does this) 180 tablet 3  . gabapentin (NEURONTIN) 300 MG capsule TAKE TWO CAPSULES BY MOUTH THREE TIMES DAILY 180 capsule 0  . levonorgestrel (MIRENA) 20 MCG/24HR IUD 1 each by Intrauterine route once.    Marland Kitchen levothyroxine (SYNTHROID, LEVOTHROID) 125 MCG tablet Take 1 tablet (125 mcg total) by mouth daily before breakfast. 30 tablet 11  . Magnesium Gluconate  250 MG TABS Take 500 mg by mouth 2 (two) times daily.    . metoprolol succinate (TOPROL-XL) 25 MG 24 hr tablet Take 3 tablets (75 mg total) by mouth 2 (two) times daily. 180 tablet 11  . mexiletine (MEXITIL) 150 MG capsule Take 1 capsule (150 mg total) by mouth 3 (three) times daily. 270 capsule 3  . Olopatadine HCl (PATANASE) 0.6 % SOLN Place 1 puff into the nose daily as needed (for allergies).     Marland Kitchen omeprazole (PRILOSEC) 40 MG capsule Take 1 capsule (40 mg total) by mouth daily. For 3 weeks then as needed 30 capsule 1  . ondansetron (ZOFRAN) 4 MG tablet TAKE  ONE TABLET BY MOUTH EVERY 6 HOURS AS NEEDED FOR NAUSEA OR VOMITING 30 tablet 1  . oxyCODONE-acetaminophen (PERCOCET) 7.5-325 MG per tablet Take 1 tablet by mouth every 4 (four) hours as needed (pain).   0  . Potassium Gluconate 595 MG CAPS Take 1 capsule (595 mg total) by mouth daily. OK to take extra on days you need extra lasix 100 capsule 3  . ranolazine (RANEXA) 1000 MG SR tablet Take 1 tablet (1,000 mg total) by mouth 2 (two) times daily. 180 tablet 3  . sertraline (ZOLOFT) 50 MG tablet Take 1 tablet (50 mg total) by mouth daily. 30 tablet 6   No current facility-administered medications on file prior to visit.    ALLERGIES: Allergies  Allergen Reactions  . Amiodarone Nausea And Vomiting    Amiodarone tablets cause nausea (unless taking Zofran) Can tolerate IV Amiodarone  . Phenergan [Promethazine Hcl] Itching  . Warfarin And Related Other (See Comments)    'burns my skin'  . Nitroglycerin Other (See Comments)    Hypotension    FAMILY HISTORY: Family History  Problem Relation Age of Onset  . Diabetes Father   . Heart disease Father     unspecified heart problem  . Thyroid disease Mother   . Anemia Mother     mediterranean anemia, ITP  . Heart disease Maternal Grandfather     CHF  . Hyperlipidemia Maternal Grandfather   . Diabetes Paternal Grandfather   . Cancer Paternal Grandfather   . Coronary artery disease Neg  Hx   . Stroke Father   . Sudden death Neg Hx   . Heart attack Neg Hx     SOCIAL HISTORY: Social History   Social History  . Marital Status: Single    Spouse Name: N/A  . Number of Children: 1  . Years of Education: N/A   Occupational History  . Scientist, clinical (histocompatibility and immunogenetics)    Social History Main Topics  . Smoking status: Former Smoker -- 1.00 packs/day for 3 years    Types: Cigarettes    Quit date: 09/30/1988  . Smokeless tobacco: Never Used     Comment: quitin 1990  . Alcohol Use: No     Comment: none since "months" as of 02/28/2013  . Drug Use: No     Comment: quiti n 1989  . Sexual Activity: Yes    Birth Control/ Protection: Pill   Other Topics Concern  . Not on file   Social History Narrative   Caffeine: 1 cup coffee/day   Divorced, 1 child.  lives with boyfriend, dogs   Occupation: Full time Tax adviser   Activity: walks 11mn/day   Diet: fruits/vegetables daily, water, no fish    REVIEW OF SYSTEMS: Constitutional: No fevers, chills, or sweats, no generalized fatigue, change in appetite Eyes: No visual changes, double vision, eye pain Ear, nose and throat: No hearing loss, ear pain, nasal congestion, sore throat Cardiovascular: No chest pain, palpitations Respiratory:  No shortness of breath at rest or with exertion, wheezes GastrointestinaI: No nausea, vomiting, diarrhea, abdominal pain, fecal incontinence Genitourinary:  No dysuria, urinary retention or frequency Musculoskeletal:  No neck pain, back pain Integumentary: No rash, pruritus, skin lesions Neurological: as above Psychiatric: No depression, insomnia, anxiety Endocrine: No palpitations, fatigue, diaphoresis, mood swings, change in appetite, change in weight, increased thirst Hematologic/Lymphatic:  No anemia, purpura, petechiae. Allergic/Immunologic: no itchy/runny eyes, nasal congestion, recent allergic reactions, rashes  PHYSICAL EXAM: Filed Vitals:   06/30/15 1357  BP: 110/88  Pulse: 78   General: No acute distress.  Head:  Normocephalic/atraumatic Eyes:  Fundoscopic exam unremarkable without vessel changes, exudates, hemorrhages or papilledema. Neck: supple, no paraspinal tenderness, full range of motion Heart:  Regular rate and rhythm Lungs:  Clear to auscultation bilaterally Back: No paraspinal tenderness Neurological Exam: alert and oriented to person, place, and time. Attention span and concentration intact, recent and remote memory intact, fund of knowledge intact.  Speech fluent and not dysarthric, language intact.  CN II-XII intact. Fundoscopic exam unremarkable without vessel changes, exudates, hemorrhages or papilledema.  Bulk and tone normal, muscle strength 5/5 throughout.  Sensation to light touch, temperature and vibration intact.  Deep tendon reflexes 2+ throughout, toes downgoing.  Finger to nose and heel to shin testing intact.  Gait normal, Romberg negative.  IMPRESSION: Numbness and tingling Generalized pain  I do not have a neurologic explanation for her symptoms.  There is no evidence of peripheral nerve disease.  I cannot completely evaluate for a CNS etiology since she is unable to have an MRI.  The only way for me to evaluate further would be a lumbar puncture.  However, I have a very low suspicion that there is something neurologic causing her symptoms.  PLAN: At this point, I recommend symptomatic treatment of pain.  I do not have any other suggestions at this time.  Recommend weight loss.  27 minutes spent face to face with patient, over 50% spent discussing diagnosis and options.  Metta Clines, DO  CC:  Ria Bush, MD

## 2015-06-30 NOTE — Telephone Encounter (Signed)
Pt called requesting referral to [facility] for weight loss surgery. Informed pt she should not need referral but she would need to call [facility] and set up times to go to the seminars. Mailed pt [facility] Weight Loss Referral packet.

## 2015-06-30 NOTE — Patient Instructions (Signed)
I don't have a neurologic explanation for your symptoms.  One thing we can do is to perform a spinal tap to look for something in the central nervous system, except my suspicion is very low so I wouldn't push to do it.  Sometimes, we just don't have an answer to symptoms of pain and numbness.

## 2015-06-30 NOTE — Telephone Encounter (Signed)
Never received call back. Called again today - got disconnected.spoke with front office staff who states they will again relay message to Dr Nilsa Nutting

## 2015-07-01 ENCOUNTER — Emergency Department (HOSPITAL_COMMUNITY)
Admission: EM | Admit: 2015-07-01 | Discharge: 2015-07-02 | Disposition: A | Discharge status: Home / Self Care | Payer: Medicare Other | Attending: Emergency Medicine | Admitting: Emergency Medicine

## 2015-07-01 ENCOUNTER — Encounter (HOSPITAL_COMMUNITY): Payer: Self-pay | Admitting: Emergency Medicine

## 2015-07-01 ENCOUNTER — Emergency Department (HOSPITAL_COMMUNITY): Payer: Medicare Other

## 2015-07-01 DIAGNOSIS — Z862 Personal history of diseases of the blood and blood-forming organs and certain disorders involving the immune mechanism: Secondary | ICD-10-CM | POA: Diagnosis not present

## 2015-07-01 DIAGNOSIS — F411 Generalized anxiety disorder: Secondary | ICD-10-CM | POA: Diagnosis not present

## 2015-07-01 DIAGNOSIS — R600 Localized edema: Secondary | ICD-10-CM

## 2015-07-01 DIAGNOSIS — I422 Other hypertrophic cardiomyopathy: Secondary | ICD-10-CM | POA: Diagnosis not present

## 2015-07-01 DIAGNOSIS — R2243 Localized swelling, mass and lump, lower limb, bilateral: Secondary | ICD-10-CM | POA: Diagnosis present

## 2015-07-01 DIAGNOSIS — I5042 Chronic combined systolic (congestive) and diastolic (congestive) heart failure: Secondary | ICD-10-CM | POA: Diagnosis not present

## 2015-07-01 DIAGNOSIS — Z792 Long term (current) use of antibiotics: Secondary | ICD-10-CM | POA: Diagnosis not present

## 2015-07-01 DIAGNOSIS — Z79899 Other long term (current) drug therapy: Secondary | ICD-10-CM | POA: Diagnosis not present

## 2015-07-01 DIAGNOSIS — Z86718 Personal history of other venous thrombosis and embolism: Secondary | ICD-10-CM | POA: Diagnosis not present

## 2015-07-01 DIAGNOSIS — Z872 Personal history of diseases of the skin and subcutaneous tissue: Secondary | ICD-10-CM | POA: Insufficient documentation

## 2015-07-01 DIAGNOSIS — E039 Hypothyroidism, unspecified: Secondary | ICD-10-CM | POA: Insufficient documentation

## 2015-07-01 DIAGNOSIS — F329 Major depressive disorder, single episode, unspecified: Secondary | ICD-10-CM | POA: Diagnosis not present

## 2015-07-01 DIAGNOSIS — Z8619 Personal history of other infectious and parasitic diseases: Secondary | ICD-10-CM | POA: Insufficient documentation

## 2015-07-01 DIAGNOSIS — J45909 Unspecified asthma, uncomplicated: Secondary | ICD-10-CM | POA: Insufficient documentation

## 2015-07-01 DIAGNOSIS — Z87891 Personal history of nicotine dependence: Secondary | ICD-10-CM | POA: Insufficient documentation

## 2015-07-01 DIAGNOSIS — G8929 Other chronic pain: Secondary | ICD-10-CM | POA: Diagnosis not present

## 2015-07-01 LAB — BASIC METABOLIC PANEL
Anion gap: 9 (ref 5–15)
BUN: 21 mg/dL — AB (ref 6–20)
CALCIUM: 8.4 mg/dL — AB (ref 8.9–10.3)
CO2: 26 mmol/L (ref 22–32)
CREATININE: 1.04 mg/dL — AB (ref 0.44–1.00)
Chloride: 99 mmol/L — ABNORMAL LOW (ref 101–111)
GFR calc Af Amer: >60 mL/min (ref >60)
GFR calc non Af Amer: >60 mL/min (ref >60)
GLUCOSE: 105 mg/dL — AB (ref 65–99)
Potassium: 3.9 mmol/L (ref 3.5–5.1)
Sodium: 134 mmol/L — ABNORMAL LOW (ref 135–145)

## 2015-07-01 LAB — HEPATIC FUNCTION PANEL
ALT: 65 U/L — AB (ref 14–54)
AST: 43 U/L — ABNORMAL HIGH (ref 15–41)
Albumin: 3.6 g/dL (ref 3.5–5.0)
Alkaline Phosphatase: 62 U/L (ref 38–126)
BILIRUBIN DIRECT: 0.1 mg/dL (ref 0.1–0.5)
BILIRUBIN INDIRECT: 0.7 mg/dL (ref 0.3–0.9)
Total Bilirubin: 0.8 mg/dL (ref 0.3–1.2)
Total Protein: 6.9 g/dL (ref 6.5–8.1)

## 2015-07-01 LAB — CBC
HCT: 39.9 % (ref 36.0–46.0)
Hemoglobin: 12.7 g/dL (ref 12.0–15.0)
MCH: 31.6 pg (ref 26.0–34.0)
MCHC: 31.8 g/dL (ref 30.0–36.0)
MCV: 99.3 fL (ref 78.0–100.0)
PLATELETS: 251 10*3/uL (ref 150–400)
RBC: 4.02 MIL/uL (ref 3.87–5.11)
RDW: 13.8 % (ref 11.5–15.5)
WBC: 5.5 10*3/uL (ref 4.0–10.5)

## 2015-07-01 LAB — I-STAT TROPONIN, ED: TROPONIN I, POC: 0.02 ng/mL (ref 0.00–0.08)

## 2015-07-01 LAB — BRAIN NATRIURETIC PEPTIDE: B NATRIURETIC PEPTIDE 5: 191.8 pg/mL — AB (ref 0.0–100.0)

## 2015-07-01 NOTE — ED Notes (Signed)
Pt from home for eval of bilateral ankle swelling and sob with exertion x4 days. Pt states has taken double dose of lasix (80 mg altogether) but has had no increase in urination. Denies any cp, states hx of CHF.

## 2015-07-01 NOTE — ED Notes (Signed)
Pt walked around nursing station at [facility] A twice. Pt o2 stats remain 92 to 97 but pt heart rate drops to 36 to 46 while ambulating. RN Jazmine informed

## 2015-07-01 NOTE — ED Provider Notes (Signed)
CSN: [id number]     Arrival date & time 07/01/15  2144 History   First MD Initiated Contact with Patient 07/01/15 2148     Chief Complaint  Patient presents with  . Shortness of Breath  . Leg Swelling     (Consider location/radiation/quality/duration/timing/severity/associated sxs/prior Treatment) HPI 44 year old female who presents with lower extremity edema and shortness of breath. She has a history of hypertrophic cardiomyopathy status post myomectomy and AICD with pacemaker, chronic systolic heart failure, mitral valve repair, and DVT on Eliquis. Reports several week history of progressively worsening lower extremity edema and decreased urine output. She has had increasing abdominal distention with weight gain as well as shortness of breath with daily activities. Has not had any chest pain, and has had baseline orthopnea and no PND. Tried to increase her dosage of Lasix over the past 3 days, with no improvement in her symptoms. Reports baseline dry cough, denies sputum production, or upper URI symptoms. Denies any nausea, vomiting, syncope, near-syncope, palpitations, or AICD firing. Past Medical History  Diagnosis Date  . Hypertrophic cardiomyopathy (HCC)     s/p septal myomectomy with implantable defibrillator 01/27/2012   . Anxiety state, unspecified   . Allergic rhinitis     to dogs  . Asthma   . Anemia     during pregnancy  . History of chicken pox   . Generalized headaches   . Ocular migraine     no HA  . Back pain     told cracked bone, vicodin didn't help, improved with percocets/muscle relaxants, no eval by prior PCP  . 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 DVT (deep venous thrombosis) (HCC) 01/2012    was placed on Warfarin after cardiac surgery but had skin reaction to warfarin so was changed to Pradaxa so DVT occurred while on Pradaxa although unclear if it occurred during transition  . Hypotension   . Atrial  fibrillation (HCC)     a. On pradaxa.  coumadin necrosis with warfarin;  b. Amio d/c'd 02/2012  . Pericardial effusion   . Chronic combined systolic and diastolic CHF (congestive heart failure) (HCC)     a. echo 03/19/12: severe asymmetric septal hypertrophy, evidence of upper septal myomectomy, peak LV outflow tract gradient 35, EF 35%,;  b.  Echo 9/13: Severe LVH, EF 30-35%, anteroseptal and inferior HK, LVOT narrow, mild AI, mild to moderate mitral stenosis, severe LAE  . Pericardial tamponade 03/14/2012  . 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 01/2012 s/p BSX ICD;  b. 06/2012 Multaq initiated due to recurrent syncope, VT/VF - had severe n/v with IV amio.  . Skin necrosis --COUMADIN   . Chronic chest wall pain     eval by CVTS, stable, rec return to [facility] if continued pain  . Syncope     a. in setting of VT/VF.  Marland Kitchen Hypothyroid   . Blood transfusion without reported diagnosis   . Neuromuscular disorder (HCC)   . Gall stones   . Atrial flutter (HCC)     a. 10/2013 with inappropriate ICD therapy; b. s/p CTI ablation by Dr Ladona Ridgel 11/16/2013;  c. 11/2013 syncope 2/2 aflutter/VT->failed DCCV->amio resumed w/ zofran for nausea.  Marland Kitchen PONV (postoperative nausea and vomiting)   . Depression   . Post-traumatic stress   . Fibromyalgia   . Hx of echocardiogram     Echo (2/16): mod LVH, EF 55-60%, no RWMA, Gr 2 DD, mild  AI, mild to mod MS, mod LAE   Past Surgical History  Procedure Laterality Date  . Cesarean section  1991  . Induced abortion  1990 and 1993  . Cardiac surgery  01/27/2012    median sternotomy, septal myectomy, MVR - [facility] (Dr. Silvestre Mesi)  . Cardiac defibrillator placement  01/2012    BSX dual chamber ICD  . Myomectomy    . Pericardial window  03/14/2012    Procedure: PERICARDIAL WINDOW;  Surgeon: Purcell Nails, MD;  Location: [facility] OR;  Service: Open Heart Surgery;  Laterality: N/A;  Subxyphoid Pericardial  Window   . Ablation  11-16-13    RFCA of atrial flutter by Dr Ladona Ridgel   .  Insert / replace / remove pacemaker    . Mitral valve repair    . Cholecystectomy N/A 01/26/2014    LAPAROSCOPIC CHOLECYSTECTOMY WITH INTRAOPERATIVE CHOLANGIOGRAM;  Surgeon: Wilmon Arms. Tsuei, MD  . Atrial flutter ablation N/A 11/16/2013    Procedure: ATRIAL FLUTTER ABLATION;  Surgeon: Marinus Maw, MD;  Location[facility] CATH LAB;  Service: Cardiovascular;  Laterality: N/A;  . Cardioversion N/A 12/13/2014    Procedure: CARDIOVERSION;  Surgeon: Wendall Stade, MD;  Location: [facility] ENDOSCOPY;  Service: Cardiovascular;  Laterality: N/A;   Family History  Problem Relation Age of Onset  . Diabetes Father   . Heart disease Father     unspecified heart problem  . Thyroid disease Mother   . Anemia Mother     mediterranean anemia, ITP  . Heart disease Maternal Grandfather     CHF  . Hyperlipidemia Maternal Grandfather   . Diabetes Paternal Grandfather   . Cancer Paternal Grandfather   . Coronary artery disease Neg Hx   . Stroke Father   . Sudden death Neg Hx   . Heart attack Neg Hx    Social History  Substance Use Topics  . Smoking status: Former Smoker -- 1.00 packs/day for 3 years    Types: Cigarettes    Quit date: 09/30/1988  . Smokeless tobacco: Never Used     Comment: quitin 1990  . Alcohol Use: No     Comment: none since "months" as of 02/28/2013   OB History    No data available     Review of Systems  10/14 systems reviewed and are negative other than those stated in the HPI   Allergies  Amiodarone; Phenergan; Warfarin and related; and Nitroglycerin  Home Medications   Prior to Admission medications   Medication Sig Start Date End Date Taking? Authorizing Provider  acetaminophen (TYLENOL) 500 MG tablet Take 500-1,000 mg by mouth 2 (two) times daily as needed for mild pain, moderate pain, fever or headache (pain).    Yes Historical Provider, MD  albuterol (PROVENTIL HFA;VENTOLIN HFA) 108 (90 BASE) MCG/ACT inhaler Inhale 1 puff into the lungs 2 (two) times daily as needed for  wheezing or shortness of breath. 05/15/15  Yes Eustaquio Boyden, MD  ALPRAZolam Prudy Feeler) 1 MG tablet Take 1 tablet (1 mg total) by mouth 3 (three) times daily as needed. 05/25/15  Yes Eustaquio Boyden, MD  amiodarone (PACERONE) 200 MG tablet 400mg  daily (two 200mg  tablets) on Monday, Tuesday, Wednesday and Thursday and Amiodarone 200mg  daily on Friday, Saturday and Sunday 06/14/15  Yes Marinus Maw, MD  amoxicillin (AMOXIL) 500 MG capsule Take 500 mg by mouth 2 (two) times daily. DENTAL PROCEDURES ONLY 02/28/15  Yes Historical Provider, MD  beclomethasone (QVAR) 80 MCG/ACT inhaler Inhale 1 puff into the lungs 2 (two)  times daily.   Yes Historical Provider, MD  cetirizine (ZYRTEC) 10 MG tablet Take 10 mg by mouth daily.    Yes Historical Provider, MD  docusate sodium (COLACE) 100 MG capsule Take 100 mg by mouth 2 (two) times daily as needed for mild constipation.   Yes Historical Provider, MD  ELIQUIS 5 MG TABS tablet TAKE ONE TABLET BY MOUTH TWICE DAILY 03/17/15  Yes Beatrice Lecher, PA-C  furosemide (LASIX) 40 MG tablet Pt may take 1 extra tablet daily as needed for swelling (Takes an extra potassium when she does this) 03/30/15  Yes Marinus Maw, MD  gabapentin (NEURONTIN) 300 MG capsule TAKE TWO CAPSULES BY MOUTH THREE TIMES DAILY 06/14/15  Yes Drema Dallas, DO  levonorgestrel (MIRENA) 20 MCG/24HR IUD 1 each by Intrauterine route once.   Yes Historical Provider, MD  levothyroxine (SYNTHROID, LEVOTHROID) 125 MCG tablet Take 1 tablet (125 mcg total) by mouth daily before breakfast. 06/20/15  Yes Beatrice Lecher, PA-C  Magnesium Gluconate 250 MG TABS Take 500 mg by mouth 2 (two) times daily.   Yes Historical Provider, MD  metoprolol succinate (TOPROL-XL) 25 MG 24 hr tablet Take 3 tablets (75 mg total) by mouth 2 (two) times daily. 01/28/15  Yes Ok Anis, NP  mexiletine (MEXITIL) 150 MG capsule Take 1 capsule (150 mg total) by mouth 3 (three) times daily. 06/14/15  Yes Marinus Maw, MD  Olopatadine  HCl (PATANASE) 0.6 % SOLN Place 1 puff into the nose daily as needed (for allergies).    Yes Historical Provider, MD  omeprazole (PRILOSEC) 40 MG capsule Take 1 capsule (40 mg total) by mouth daily. For 3 weeks then as needed 04/11/15  Yes Eustaquio Boyden, MD  oxyCODONE-acetaminophen (PERCOCET) 7.5-325 MG per tablet Take 1 tablet by mouth every 4 (four) hours as needed (pain).  10/13/14  Yes Historical Provider, MD  Potassium Gluconate 595 MG CAPS Take 1 capsule (595 mg total) by mouth daily. OK to take extra on days you need extra lasix 11/03/14  Yes Scott T Alben Spittle, PA-C  ranolazine (RANEXA) 1000 MG SR tablet Take 1 tablet (1,000 mg total) by mouth 2 (two) times daily. 04/17/15 04/16/16 Yes Tonny Bollman, MD  sertraline (ZOLOFT) 50 MG tablet Take 1 tablet (50 mg total) by mouth daily. 05/25/15  Yes Eustaquio Boyden, MD   BP 98/52 mmHg  Pulse 92  Temp(Src) 98.1 F (36.7 C) (Oral)  Resp 13  Ht  (1.549 m)  Wt 253 lb (114.76 kg)  BMI 47.83 kg/m2  SpO2 94% Physical Exam Physical Exam  Nursing note and vitals reviewed. Constitutional: Well developed, well nourished, non-toxic, and in no acute distress Head: Normocephalic and atraumatic.  Mouth/Throat: Oropharynx is clear and moist.  Neck: Normal range of motion. Neck supple.  Cardiovascular: Normal rate and regular rhythm.   Pulmonary/Chest: Effort normal and breath sounds normal. Pedal edema bilaterally. Abdominal: Soft. Obese, mildly distended. There is no tenderness. There is no rebound and no guarding.  Musculoskeletal: Normal range of motion.  Neurological: Alert, no facial droop, fluent speech, moves all extremities symmetrically Skin: Skin is warm and dry.  Psychiatric: Cooperative  ED Course  Procedures (including critical care time) Labs Review Labs Reviewed  BASIC METABOLIC PANEL - Abnormal; Notable for the following:    Sodium 134 (*)    Chloride 99 (*)    Glucose, Bld 105 (*)    BUN 21 (*)    Creatinine, Ser 1.04 (*)     Calcium  8.4 (*)    All other components within normal limits  BRAIN NATRIURETIC PEPTIDE - Abnormal; Notable for the following:    B Natriuretic Peptide 191.8 (*)    All other components within normal limits  HEPATIC FUNCTION PANEL - Abnormal; Notable for the following:    AST 43 (*)    ALT 65 (*)    All other components within normal limits  CBC  I-STAT TROPOININ, ED    Imaging Review Dg Chest 2 View  07/01/2015   CLINICAL DATA:  Shortness of breath.  Lower extremity swelling  EXAM: CHEST  2 VIEW  COMPARISON:  12/12/2014  FINDINGS: Chronic cardiopericardial enlargement. ICD/pacer leads from the left are in stable position, including azygos system lead.  Chronic pulmonary venous congestion without edema or effusion. No pneumonia.  Previous median sternotomy, reportedly for mitral valve repair.  IMPRESSION: No acute finding.   Electronically Signed   By: Marnee Spring M.D.   On: 07/01/2015 22:35   I have personally reviewed and evaluated these images and lab results as part of my medical decision-making.   EKG Interpretation   Date/Time:  Saturday July 01 2015 21:55:59 EDT Ventricular Rate:  75 PR Interval:  249 QRS Duration: 187 QT Interval:  522 QTC Calculation: 583 R Axis:   -48 Text Interpretation:  Sinus rhythm Sinus rhythm with first degree AV block  Left bundle branch block No significant change since last tracing  Confirmed by Nesanel Aguila MD, Ahsan Esterline ([id number]) on 07/01/2015 10:08:55 PM      MDM   Final diagnoses:  Bilateral edema of lower extremity  Hypertrophic cardiomyopathy (HCC)    44 year old female with history of chronic systolic heart failure, DVT on Eliquis, HOCM s/p myomectomy with AICD/PPM who presents with lower extremity edema and SOB. She is well appearing, non-toxic and in no acute distress. On room air, breathing comfortably, no conversational dyspnea, and with clear lungs. Pedal edema noted, but no other major evidence of fluid overload. BNP normal, CXR  without edema or acute processes. Kidney function and LFTs near baseline. No significant evidence of acute heart failure. Symptoms does not seem ACS related, and she does not have ischemic changes on EKG and trop negative. On Eliquis, compliant, and thus, no major concern for PE at this time. Ambulated in the ED with normal work of breathing and oxygenation. Incidentally noted to have bradycardia to 30, but no major symptoms. Felt to be unlikely PPM error, and pending PPM interrogation. Discussed with cardiology who felt that if PPM interrogation unremarkable can discharge home with close cardiology follow-up. To receive single dose of IV lasix in the ED.     Lavera Guise, MD 07/02/15 [id number]

## 2015-07-02 ENCOUNTER — Encounter (HOSPITAL_COMMUNITY): Payer: Self-pay | Admitting: Emergency Medicine

## 2015-07-02 DIAGNOSIS — R2243 Localized swelling, mass and lump, lower limb, bilateral: Secondary | ICD-10-CM | POA: Diagnosis not present

## 2015-07-02 MED ORDER — FUROSEMIDE 10 MG/ML IJ SOLN
40.0000 mg | Freq: Once | INTRAMUSCULAR | Status: AC
  Administered 2015-07-02: 40 mg via INTRAVENOUS
  Filled 2015-07-02: qty 4

## 2015-07-02 MED ORDER — ALPRAZOLAM 0.25 MG PO TABS
1.0000 mg | ORAL_TABLET | Freq: Once | ORAL | Status: AC
  Administered 2015-07-02: 1 mg via ORAL
  Filled 2015-07-02: qty 4

## 2015-07-02 NOTE — ED Notes (Signed)
[employer] rep here

## 2015-07-02 NOTE — ED Notes (Signed)
[employer] person notified and he is coming in

## 2015-07-02 NOTE — Discharge Instructions (Signed)
Continue your normal dose of lasix when you return home. Return without fail for worsening symptoms, including chest pain, worsening sob, or any other symptoms concerning to you.  Edema Edema is an abnormal buildup of fluids in your bodytissues. Edema is somewhatdependent on gravity to pull the fluid to the lowest place in your body. That makes the condition more common in the legs and thighs (lower extremities). Painless swelling of the feet and ankles is common and becomes more likely as you get older. It is also common in looser tissues, like around your eyes.  When the affected area is squeezed, the fluid may move out of that spot and leave a dent for a few moments. This dent is called pitting.  CAUSES  There are many possible causes of edema. Eating too much salt and being on your feet or sitting for a long time can cause edema in your legs and ankles. Hot weather may make edema worse. Common medical causes of edema include:  Heart failure.  Liver disease.  Kidney disease.  Weak blood vessels in your legs.  Cancer.  An injury.  Pregnancy.  Some medications.  Obesity. SYMPTOMS  Edema is usually painless.Your skin may look swollen or shiny.  DIAGNOSIS  Your health care provider may be able to diagnose edema by asking about your medical history and doing a physical exam. You may need to have tests such as X-rays, an electrocardiogram, or blood tests to check for medical conditions that may cause edema.  TREATMENT  Edema treatment depends on the cause. If you have heart, liver, or kidney disease, you need the treatment appropriate for these conditions. General treatment may include:  Elevation of the affected body part above the level of your heart.  Compression of the affected body part. Pressure from elastic bandages or support stockings squeezes the tissues and forces fluid back into the blood vessels. This keeps fluid from entering the tissues.  Restriction of fluid and  salt intake.  Use of a water pill (diuretic). These medications are appropriate only for some types of edema. They pull fluid out of your body and make you urinate more often. This gets rid of fluid and reduces swelling, but diuretics can have side effects. Only use diuretics as directed by your health care provider. HOME CARE INSTRUCTIONS   Keep the affected body part above the level of your heart when you are lying down.   Do not sit still or stand for prolonged periods.   Do not put anything directly under your knees when lying down.  Do not wear constricting clothing or garters on your upper legs.   Exercise your legs to work the fluid back into your blood vessels. This may help the swelling go down.   Wear elastic bandages or support stockings to reduce ankle swelling as directed by your health care provider.   Eat a low-salt diet to reduce fluid if your health care provider recommends it.   Only take medicines as directed by your health care provider. SEEK MEDICAL CARE IF:   Your edema is not responding to treatment.  You have heart, liver, or kidney disease and notice symptoms of edema.  You have edema in your legs that does not improve after elevating them.   You have sudden and unexplained weight gain. SEEK IMMEDIATE MEDICAL CARE IF:   You develop shortness of breath or chest pain.   You cannot breathe when you lie down.  You develop pain, redness, or warmth in the  swollen areas.   You have heart, liver, or kidney disease and suddenly get edema.  You have a fever and your symptoms suddenly get worse. MAKE SURE YOU:   Understand these instructions.  Will watch your condition.  Will get help right away if you are not doing well or get worse. Document Released: 09/16/2005 Document Revised: 01/31/2014 Document Reviewed: 07/09/2013 [facility] Patient Information 2015 [place], [place]. This information is not intended to replace advice given to you by your  health care provider. Make sure you discuss any questions you have with your health care provider.

## 2015-07-02 NOTE — ED Notes (Signed)
ER dual chamber ICD check.  No new episodes and normal device function.  DDDR 75  Atrial lead:  5.46mV, 587, 0.9V@0 .5ms RV lead:  14.12mV, 496, 1.2V@0 .5ms Shock:  [address] [place] [phone] [employer]

## 2015-07-02 NOTE — ED Notes (Signed)
Called Dr. Judd Lien in regard to patient's increasing anxiety and requesting prn dose of xanax. MD acknowledges, gives verbal order for  of xanax. Patient aware.

## 2015-07-02 NOTE — ED Notes (Signed)
Attempted x 4 to interrogate  The pts [employer] aicd

## 2015-07-02 NOTE — ED Notes (Signed)
Patient allowed to eat at this time, per Dr. Judd Lien.

## 2015-07-02 NOTE — ED Notes (Signed)
Discussed patient status with Dr. Judd Lien, patient received lasix, current bp is 87/58, she has voided once since. Requesting to eat, inquiring if xanax may still be administered as ordered. MD acknowledges, allows xanax at this time, with close monitoring of bp. Meal given to patient as well.

## 2015-07-03 ENCOUNTER — Ambulatory Visit (INDEPENDENT_AMBULATORY_CARE_PROVIDER_SITE_OTHER): Payer: Medicare Other | Admitting: Licensed Clinical Social Worker

## 2015-07-03 DIAGNOSIS — F331 Major depressive disorder, recurrent, moderate: Secondary | ICD-10-CM

## 2015-07-03 DIAGNOSIS — F411 Generalized anxiety disorder: Secondary | ICD-10-CM | POA: Diagnosis not present

## 2015-07-03 NOTE — Telephone Encounter (Signed)
Comment indicates message was left to call back by Carlye Grippe, LPN on 4/54/09.  Pt was seen in ED on 07/01/15

## 2015-07-04 ENCOUNTER — Telehealth: Payer: Self-pay

## 2015-07-04 ENCOUNTER — Encounter: Payer: Self-pay | Admitting: *Deleted

## 2015-07-04 ENCOUNTER — Ambulatory Visit (INDEPENDENT_AMBULATORY_CARE_PROVIDER_SITE_OTHER): Payer: Medicare Other | Admitting: Family Medicine

## 2015-07-04 ENCOUNTER — Encounter: Payer: Self-pay | Admitting: Internal Medicine

## 2015-07-04 ENCOUNTER — Encounter: Payer: Self-pay | Admitting: Family Medicine

## 2015-07-04 VITALS — BP 116/84 | HR 84 | Temp 98.1°F | Wt 246.8 lb

## 2015-07-04 DIAGNOSIS — I472 Ventricular tachycardia, unspecified: Secondary | ICD-10-CM

## 2015-07-04 DIAGNOSIS — F418 Other specified anxiety disorders: Secondary | ICD-10-CM | POA: Diagnosis not present

## 2015-07-04 DIAGNOSIS — F4310 Post-traumatic stress disorder, unspecified: Secondary | ICD-10-CM | POA: Diagnosis not present

## 2015-07-04 DIAGNOSIS — Z23 Encounter for immunization: Secondary | ICD-10-CM | POA: Diagnosis not present

## 2015-07-04 MED ORDER — SERTRALINE HCL 100 MG PO TABS: 100.0000 mg | ORAL_TABLET | Freq: Every day | ORAL | Status: DC

## 2015-07-04 NOTE — Telephone Encounter (Signed)
Placed another call to Dr Charlann Boxer office - spoke with Dr Nilsa Nutting.  Patient has HOCM s/p myomectomy, defibrillator in place for VTach, s/p up to 8 shocks while awake.  Has legitimate anxiety and PTSD stemming from this and her cardiac illness.  Pt has tried klonopin (caused palpitations) and ativan (ineffective) in the past.  She is also treated with sertraline for GAD, MDD. I feel current alprazolam use is appropriate.  Plz fax today's phone note to Dr Nilsa Nutting per his request.

## 2015-07-04 NOTE — Telephone Encounter (Signed)
Pt left v/m requesting cb about getting xanax refilled. Spoke with pt and she has appt to see Dr Reece Agar today at 3:45 pm. Nothing further needed.

## 2015-07-04 NOTE — Patient Instructions (Addendum)
Increase sertraline to  daily. Continue taking 1/2 tablet of xanax as needed. Controlled substance agreement today with [employer]. Return in 6 weeks for follow up. Flu shot today.

## 2015-07-04 NOTE — Assessment & Plan Note (Signed)
Contributes to anxiety.

## 2015-07-04 NOTE — Progress Notes (Signed)
BP 116/84 mmHg  Pulse 84  Temp(Src) 98.1 F (36.7 C) (Oral)  Wt 246 lb 12 oz (111.925 kg)  LMP 06/25/2015   CC: discuss meds  Subjective:    Patient ID: [name], female    DOB: 05/21/1971, 44 y.o.   MRN: [id number]  HPI: [name] is a 44 y.o. female presenting on 07/04/2015 for Medication Management   See recent phone note. Initially some concern by pain clinic with benzo + narcotic use. I discussed concerns with her pain management physician Dr Nilsa Nutting.   On alprazolam  2-3 tablets regularly for 2+ years. Takes this for PTSD, anxiety and depression. Takes appropriately. She has actually been tapering med on her own. Down to 3/4 tablet once daily. Has had trouble with some withdrawal symptoms (tremors, anxiety).   Notes that when she takes benzo with narcotic causes oversedation.   Klonopin caused worsening depression - crying spells. Ativan ineffective. Has not tried valium.   Relevant past medical, surgical, family and social history reviewed and updated as indicated. Interim medical history since our last visit reviewed. Allergies and medications reviewed and updated. Current Outpatient Prescriptions on File Prior to Visit  Medication Sig  . acetaminophen (TYLENOL) 500 MG tablet Take 500-1,000 mg by mouth 2 (two) times daily as needed for mild pain, moderate pain, fever or headache (pain).   Marland Kitchen albuterol (PROVENTIL HFA;VENTOLIN HFA) 108 (90 BASE) MCG/ACT inhaler Inhale 1 puff into the lungs 2 (two) times daily as needed for wheezing or shortness of breath.  . ALPRAZolam (XANAX) 1 MG tablet Take 1 tablet (1 mg total) by mouth 3 (three) times daily as needed.  Marland Kitchen amiodarone (PACERONE) 200 MG tablet  daily (two  tablets) on Monday, Tuesday, Wednesday and Thursday and Amiodarone  daily on Friday, Saturday and Sunday  . amoxicillin (AMOXIL) 500 MG capsule Take 500 mg by mouth 2 (two) times daily. DENTAL PROCEDURES ONLY  . beclomethasone (QVAR) 80 MCG/ACT  inhaler Inhale 1 puff into the lungs 2 (two) times daily.  . cetirizine (ZYRTEC) 10 MG tablet Take 10 mg by mouth daily.   Marland Kitchen docusate sodium (COLACE) 100 MG capsule Take 100 mg by mouth 2 (two) times daily as needed for mild constipation.  Marland Kitchen ELIQUIS 5 MG TABS tablet TAKE ONE TABLET BY MOUTH TWICE DAILY  . furosemide (LASIX) 40 MG tablet Pt may take 1 extra tablet daily as needed for swelling (Takes an extra potassium when she does this)  . gabapentin (NEURONTIN) 300 MG capsule TAKE TWO CAPSULES BY MOUTH THREE TIMES DAILY  . levonorgestrel (MIRENA) 20 MCG/24HR IUD 1 each by Intrauterine route once.  Marland Kitchen levothyroxine (SYNTHROID, LEVOTHROID) 125 MCG tablet Take 1 tablet (125 mcg total) by mouth daily before breakfast.  . Magnesium Gluconate 250 MG TABS Take 500 mg by mouth 2 (two) times daily.  . metoprolol succinate (TOPROL-XL) 25 MG 24 hr tablet Take 3 tablets (75 mg total) by mouth 2 (two) times daily.  Marland Kitchen mexiletine (MEXITIL) 150 MG capsule Take 1 capsule (150 mg total) by mouth 3 (three) times daily.  . Olopatadine HCl (PATANASE) 0.6 % SOLN Place 1 puff into the nose daily as needed (for allergies).   Marland Kitchen omeprazole (PRILOSEC) 40 MG capsule Take 1 capsule (40 mg total) by mouth daily. For 3 weeks then as needed  . oxyCODONE-acetaminophen (PERCOCET) 7.5-325 MG per tablet Take 1 tablet by mouth every 4 (four) hours as needed (pain).   . Potassium Gluconate 595 MG CAPS Take 1  capsule (595 mg total) by mouth daily. OK to take extra on days you need extra lasix  . ranolazine (RANEXA) 1000 MG SR tablet Take 1 tablet (1,000 mg total) by mouth 2 (two) times daily.   No current facility-administered medications on file prior to visit.    Review of Systems Per HPI unless specifically indicated above     Objective:    BP 116/84 mmHg  Pulse 84  Temp(Src) 98.1 F (36.7 C) (Oral)  Wt 246 lb 12 oz (111.925 kg)  LMP 06/25/2015  Wt Readings from Last 3 Encounters:  07/04/15 246 lb 12 oz (111.925 kg)    07/01/15 253 lb (114.76 kg)  06/30/15 253 lb (114.76 kg)    Physical Exam  Constitutional: She appears well-developed and well-nourished. No distress.  Psychiatric: She has a normal mood and affect. Her behavior is normal. Judgment and thought content normal.  Nursing note and vitals reviewed.  Results for orders placed or performed during the hospital encounter of 07/01/15  Basic metabolic panel  Result Value Ref Range   Sodium 134 (L) 135 - 145 mmol/L   Potassium 3.9 3.5 - 5.1 mmol/L   Chloride 99 (L) 101 - 111 mmol/L   CO2 26 22 - 32 mmol/L   Glucose, Bld 105 (H) 65 - 99 mg/dL   BUN 21 (H) 6 - 20 mg/dL   Creatinine, Ser 1.61 (H) 0.44 - 1.00 mg/dL   Calcium 8.4 (L) 8.9 - 10.3 mg/dL   GFR calc non Af Amer >60 >60 mL/min   GFR calc Af Amer >60 >60 mL/min   Anion gap 9 5 - 15  CBC  Result Value Ref Range   WBC 5.5 4.0 - 10.5 K/uL   RBC 4.02 3.87 - 5.11 MIL/uL   Hemoglobin 12.7 12.0 - 15.0 g/dL   HCT 09.6 04.5 - 40.9 %   MCV 99.3 78.0 - 100.0 fL   MCH 31.6 26.0 - 34.0 pg   MCHC 31.8 30.0 - 36.0 g/dL   RDW 81.1 91.4 - 78.2 %   Platelets 251 150 - 400 K/uL  Brain natriuretic peptide  Result Value Ref Range   B Natriuretic Peptide 191.8 (H) 0.0 - 100.0 pg/mL  Hepatic function panel  Result Value Ref Range   Total Protein 6.9 6.5 - 8.1 g/dL   Albumin 3.6 3.5 - 5.0 g/dL   AST 43 (H) 15 - 41 U/L   ALT 65 (H) 14 - 54 U/L   Alkaline Phosphatase 62 38 - 126 U/L   Total Bilirubin 0.8 0.3 - 1.2 mg/dL   Bilirubin, Direct 0.1 0.1 - 0.5 mg/dL   Indirect Bilirubin 0.7 0.3 - 0.9 mg/dL  I-stat troponin, ED  Result Value Ref Range   Troponin i, poc 0.02 0.00 - 0.08 ng/mL   Comment 3              Assessment & Plan:  Over 25 minutes were spent face-to-face with the patient during this encounter and >50% of that time was spent on counseling and coordination of care  Problem List Items Addressed This Visit    VT (ventricular tachycardia) (HCC)    Contributes to anxiety.      PTSD  (post-traumatic stress disorder)    PTSD stemming from multiple shocks while awake. This is overall stable on current regimen. See below.      Depression with anxiety - Primary (Chronic)    Again reviewed risks of benzodiazepines to control anxiety - including addiction/abuse potential as  well as possible dependence/tolerance. Will have patient fill controlled substance agreement form and UDS today. Discussed other anxiety treatment options which patient is amenable to - including buspar or change in benzo - although pt has not responded well to klonopin or ativan.  Will start by increasing sertraline to  daily - new dose sent to pharmacy. Encouraged patient continue to minimize prn xanax use - to 1/2 tablet TID PRN.  Pt agrees with plan.       Other Visit Diagnoses    Need for influenza vaccination        Relevant Orders    Flu Vaccine QUAD 36+ mos PF IM (Fluarix & Fluzone Quad PF)        Follow up plan: Return in about 6 years (around 07/03/2021), or as needed, for follow up visit.

## 2015-07-04 NOTE — Telephone Encounter (Signed)
Note faxed.

## 2015-07-04 NOTE — Assessment & Plan Note (Signed)
Again reviewed risks of benzodiazepines to control anxiety - including addiction/abuse potential as well as possible dependence/tolerance. Will have patient fill controlled substance agreement form and UDS today. Discussed other anxiety treatment options which patient is amenable to - including buspar or change in benzo - although pt has not responded well to klonopin or ativan.  Will start by increasing sertraline to  daily - new dose sent to pharmacy. Encouraged patient continue to minimize prn xanax use - to 1/2 tablet TID PRN.  Pt agrees with plan.

## 2015-07-04 NOTE — Assessment & Plan Note (Signed)
PTSD stemming from multiple shocks while awake. This is overall stable on current regimen. See below.

## 2015-07-04 NOTE — Progress Notes (Signed)
Pre visit review using our clinic review tool, if applicable. No additional management support is needed unless otherwise documented below in the visit note. 

## 2015-07-06 NOTE — Progress Notes (Signed)
Cardiology Office Note   Date:  07/07/2015   ID:  TURNER KUNZMAN, DOB 1971-04-11, MRN [id number]  PCP:  Eustaquio Boyden, MD  Cardiologist:  Dr. Tonny Bollman   Electrophysiologist:  Dr. Lewayne Bunting   Chief Complaint  Patient presents with  . Hospitalization Follow-up  . Leg Swelling     History of Present Illness: [name] is a 44 y.o. female with a hx of HOCM status post septal myomectomy and mitral valve repair at [facility] in 12/2011, chronic combined systolic and diastolic HF.  Postoperative course was complicated by ventricular tachycardia, atrial fibrillation and left upper extremity DVT. AICD was implanted. She had necrotic skin reaction on Coumadin and was transitioned to Pradaxa >> then Eliquis. 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 She ultimately underwent pericardial window by Dr. Cornelius Moras. She has had refractory VT/VF and atrial arrhythmias.  She has failed medical Rx with multiple antiarrhythmics.  She has had multiple episodes of appropriate and inappropriate ICD shocks.    Admitted 10/2013 with ICD shock in the setting of AFlutter >> s/p RFCA. Admitted 12/2013 with atrial tachycardia. Admitted in 08/2014 for ICD shock in the setting of ATach.  Beta blocker was adjusted.    Admitted 3/16 with recurrent AFlutter.  VR was under her detection zone of 240 and she was not shocked.  DCCV >> NSR >> return of AFlutter.  She was started on Amiodarone with return of NSR.    She recently was seen in the ED for increased LE edema and shortness of breath.  CXR was ok and BNP was minimally elevated.  Device was interrogated.  This demonstrated normal device function and new arrhythmia episodes.  She returns for FU.    She had noted worsening LE edema and dyspnea prior to going to the emergency room. She did receive a dose of IV Lasix. She is feeling better. She does note continued dyspnea on exertion. This is chronic without significant  change. She denies orthopnea or PND. She does have a nonproductive cough. She denies wheezing, fever. She denies melena or hematochezia.   Studies/Reports Reviewed Today:  Echo 11/24/14 - Moderateconcentric hypertrophy. EF 55% to 60%. Wallmotion was normal; Grade 2 diastolic dysfunction). - Aortic valve: There was mild regurgitation. - Mitral valve: Per records patient is s/p MV repair but there appears to be some thickening and calcification of the MV leaflets with diastolic doming of the anterior leaflet. The mean gradient across the valve is 8-61mmHg consistent with moderate Mitral stenosis but the MVA and PHT are consistent with mild mitral stenosis. Calcified annulus. Moderate diffuse thickening of the anterior leaflet. Mild focal calcification of the anterior leaflet (medial segment(s)), with mild involvement of chords, consistent with rheumatic disease. Mobility of the anterior leaflet was mildly restricted. Diastolic leaflet doming was present. The findings are consistent with mild to moderate stenosis. There was trivial regurgitation. - Left atrium: The atrium was moderately dilated.  Echocardiogram (11/2013):   HOCM with prior septal myomectomy, moderate LVH, EF 40-45%, trivial AI, mitral valve s/p repair with a small diastolic gradient, no MS (mean 9 mmHg, peak 16 mmHg), trivial MR, moderate LAE    Past Medical History  Diagnosis Date  . Hypertrophic cardiomyopathy (HCC)     s/p septal myomectomy with implantable defibrillator 01/27/2012   . Anxiety state, unspecified   . Allergic rhinitis     to dogs  . Asthma   . Anemia     during pregnancy  .  History of chicken pox   . Generalized headaches   . Ocular migraine     no HA  . Back pain     told cracked bone, vicodin didn't help, improved with percocets/muscle relaxants, no eval by prior PCP  . 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 DVT (deep venous  thrombosis) (HCC) 01/2012    was placed on Warfarin after cardiac surgery but had skin reaction to warfarin so was changed to Pradaxa so DVT occurred while on Pradaxa although unclear if it occurred during transition  . Hypotension   . Atrial fibrillation (HCC)     a. On pradaxa.  coumadin necrosis with warfarin;  b. Amio d/c'd 02/2012  . Pericardial effusion   . Chronic combined systolic and diastolic CHF (congestive heart failure) (HCC)     a. echo 03/19/12: severe asymmetric septal hypertrophy, evidence of upper septal myomectomy, peak LV outflow tract gradient 35, EF 35%,;  b.  Echo 9/13: Severe LVH, EF 30-35%, anteroseptal and inferior HK, LVOT narrow, mild AI, mild to moderate mitral stenosis, severe LAE  . Pericardial tamponade 03/14/2012  . 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 01/2012 s/p BSX ICD;  b. 06/2012 Multaq initiated due to recurrent syncope, VT/VF - had severe n/v with IV amio.  . Skin necrosis --COUMADIN   . Chronic chest wall pain     eval by CVTS, stable, rec return to [facility] if continued pain  . Syncope     a. in setting of VT/VF.  Marland Kitchen Hypothyroid   . Blood transfusion without reported diagnosis   . Neuromuscular disorder (HCC)   . Gall stones   . Atrial flutter (HCC)     a. 10/2013 with inappropriate ICD therapy; b. s/p CTI ablation by Dr Ladona Ridgel 11/16/2013;  c. 11/2013 syncope 2/2 aflutter/VT->failed DCCV->amio resumed w/ zofran for nausea.  Marland Kitchen PONV (postoperative nausea and vomiting)   . Depression   . Post-traumatic stress   . Fibromyalgia   . Hx of echocardiogram     Echo (2/16): mod LVH, EF 55-60%, no RWMA, Gr 2 DD, mild AI, mild to mod MS, mod LAE    Past Surgical History  Procedure Laterality Date  . Cesarean section  1991  . Induced abortion  1990 and 1993  . Cardiac surgery  01/27/2012    median sternotomy, septal myectomy, MVR - [facility] (Dr. Silvestre Mesi)  . Cardiac defibrillator placement  01/2012    BSX dual chamber ICD  . Myomectomy    . Pericardial  window  03/14/2012    Procedure: PERICARDIAL WINDOW;  Surgeon: Purcell Nails, MD;  Location: [facility] OR;  Service: Open Heart Surgery;  Laterality: N/A;  Subxyphoid Pericardial  Window   . Ablation  11-16-13    RFCA of atrial flutter by Dr Ladona Ridgel   . Insert / replace / remove pacemaker    . Mitral valve repair    . Cholecystectomy N/A 01/26/2014    LAPAROSCOPIC CHOLECYSTECTOMY WITH INTRAOPERATIVE CHOLANGIOGRAM;  Surgeon: Wilmon Arms. Tsuei, MD  . Atrial flutter ablation N/A 11/16/2013    Procedure: ATRIAL FLUTTER ABLATION;  Surgeon: Marinus Maw, MD;  Location: [facility] CATH LAB;  Service: Cardiovascular;  Laterality: N/A;  . Cardioversion N/A 12/13/2014    Procedure: CARDIOVERSION;  Surgeon: Wendall Stade, MD;  Location: [facility] ENDOSCOPY;  Service: Cardiovascular;  Laterality: N/A;     Current Outpatient Prescriptions  Medication Sig Dispense Refill  .  acetaminophen (TYLENOL) 500 MG tablet Take 500-1,000 mg by mouth 2 (two) times daily as needed for mild pain, moderate pain, fever or headache (pain).     Marland Kitchen albuterol (PROVENTIL HFA;VENTOLIN HFA) 108 (90 BASE) MCG/ACT inhaler Inhale 1 puff into the lungs 2 (two) times daily as needed for wheezing or shortness of breath. 1 Inhaler 3  . ALPRAZolam (XANAX) 1 MG tablet Take 1 tablet (1 mg total) by mouth 3 (three) times daily as needed. 80 tablet 0  . amiodarone (PACERONE) 200 MG tablet 400mg  daily (two 200mg  tablets) on Monday, Tuesday, Wednesday and Thursday and Amiodarone 200mg  daily on Friday, Saturday and Sunday 135 tablet 3  . amoxicillin (AMOXIL) 500 MG capsule Take 500 mg by mouth 2 (two) times daily. DENTAL PROCEDURES ONLY  1  . beclomethasone (QVAR) 80 MCG/ACT inhaler Inhale 1 puff into the lungs 2 (two) times daily.    . cetirizine (ZYRTEC) 10 MG tablet Take 10 mg by mouth daily.     Marland Kitchen docusate sodium (COLACE) 100 MG capsule Take 100 mg by mouth 2 (two) times daily as needed for mild constipation.    Marland Kitchen ELIQUIS 5 MG TABS tablet TAKE ONE TABLET BY MOUTH  TWICE DAILY 60 tablet 5  . furosemide (LASIX) 40 MG tablet Pt may take 1 extra tablet daily as needed for swelling (Takes an extra potassium when she does this) 180 tablet 3  . gabapentin (NEURONTIN) 300 MG capsule TAKE TWO CAPSULES BY MOUTH THREE TIMES DAILY 180 capsule 0  . levonorgestrel (MIRENA) 20 MCG/24HR IUD 1 each by Intrauterine route once.    Marland Kitchen levothyroxine (SYNTHROID, LEVOTHROID) 125 MCG tablet Take 1 tablet (125 mcg total) by mouth daily before breakfast. 30 tablet 11  . Magnesium Gluconate 250 MG TABS Take 500 mg by mouth 2 (two) times daily.    . metoprolol succinate (TOPROL-XL) 25 MG 24 hr tablet Take 3 tablets (75 mg total) by mouth 2 (two) times daily. 180 tablet 11  . mexiletine (MEXITIL) 150 MG capsule Take 1 capsule (150 mg total) by mouth 3 (three) times daily. 270 capsule 3  . Olopatadine HCl (PATANASE) 0.6 % SOLN Place 1 puff into the nose daily as needed (for allergies).     Marland Kitchen omeprazole (PRILOSEC) 40 MG capsule Take 1 capsule (40 mg total) by mouth daily. For 3 weeks then as needed 30 capsule 1  . ondansetron (ZOFRAN) 4 MG tablet Take 4 mg by mouth 2 (two) times daily as needed. EVERY 12 HOURS AS NEEDED BY MOUTH FOR NAUSEA  3  . oxyCODONE-acetaminophen (PERCOCET) 7.5-325 MG per tablet Take 1 tablet by mouth every 4 (four) hours as needed (pain).   0  . Potassium Gluconate 595 MG CAPS Take 1 capsule (595 mg total) by mouth daily. OK to take extra on days you need extra lasix 100 capsule 3  . ranolazine (RANEXA) 1000 MG SR tablet Take 1 tablet (1,000 mg total) by mouth 2 (two) times daily. 180 tablet 3  . sertraline (ZOLOFT) 100 MG tablet Take 1 tablet (100 mg total) by mouth daily. 30 tablet 11   No current facility-administered medications for this visit.    Allergies:   Amiodarone; Phenergan; Warfarin and related; and Nitroglycerin    Social History:  The patient  reports that she quit smoking about 26 years ago. Her smoking use included Cigarettes. She has a 3  pack-year smoking history. She has never used smokeless tobacco. She reports that she does not drink alcohol or use  illicit drugs.   Family History:  The patient's family history includes Anemia in her mother; Cancer in her paternal grandfather; Diabetes in her father and paternal grandfather; Heart disease in her father and maternal grandfather; Hyperlipidemia in her maternal grandfather; Stroke in her father; Thyroid disease in her mother. There is no history of Coronary artery disease, Sudden death, or Heart attack.    ROS:  Please see the history of present illness.    Review of Systems  Constitution: Positive for malaise/fatigue.  HENT: Positive for headaches.   Eyes: Positive for visual disturbance.  Cardiovascular: Positive for dyspnea on exertion, irregular heartbeat and leg swelling.  Musculoskeletal: Positive for back pain and myalgias.  Neurological: Positive for dizziness and loss of balance.  Psychiatric/Behavioral: Positive for depression. The patient is nervous/anxious.   All other systems reviewed and are negative.   PHYSICAL EXAM: VS:  BP 90/54 mmHg  Pulse 75  Ht 5\' 1"  (1.549 m)  Wt 250 lb (113.399 kg)  BMI 47.26 kg/m2  LMP 06/25/2015    Wt Readings from Last 3 Encounters:  07/07/15 250 lb (113.399 kg)  07/04/15 246 lb 12 oz (111.925 kg)  07/01/15 253 lb (114.76 kg)     GEN: Well nourished, well developed, in no acute distress HEENT: normal Neck: no JVD, no masses Cardiac:  Normal S1/S2, RRR; 1-2/6 systolic murmur LSB, no rubs or gallops, very trace bilateral LE edema  Respiratory:  clear to auscultation bilaterally, no wheezing, rhonchi or rales. GI: soft, nontender, nondistended, + BS MS: no deformity or atrophy Skin: warm and dry  Neuro:  CNs II-XII intact, Strength and sensation are intact Psych: Normal affect   EKG:  EKG is ordered today.  It demonstrates:   Atrial paced, LBBB, HR 75, no change from prior tracing   Recent Labs: 12/26/2014: Magnesium  1.8 03/06/2015: Pro B Natriuretic peptide (BNP) 215.0* 06/19/2015: TSH 10.78* 07/01/2015: ALT 65*; B Natriuretic Peptide 191.8*; BUN 21*; Creatinine, Ser 1.04*; Hemoglobin 12.7; Platelets 251; Potassium 3.9; Sodium 134*    Lipid Panel    Component Value Date/Time   CHOL 192 08/16/2013 0823   TRIG 152.0* 08/16/2013 0823   HDL 49.10 08/16/2013 0823   CHOLHDL 4 08/16/2013 0823   VLDL 30.4 08/16/2013 0823   LDLCALC 113* 08/16/2013 0823      ASSESSMENT AND PLAN:  1.  Atrial Arrhythmias:  Maintaining sinus rhythm. She remains on Amiodarone and Eliquis. She is tolerating the Amiodarone thus far. She is taking Zofran to control nausea.  TSH has been elevated and she is now on Synthroid. Recent ALT elevated.  Arrange FU LFTs, TSH.     2.  Ventricular tachycardia/Ventricular Fibrillation:  Recent trip to the ED.  ICD interrogation with no further episodes. She remains on mexiletine, Amiodarone and Ranexa.  FU with EP as planned.     3.  S/p AICD:  Follow-up with EP as planned.  4.  Hypertrophic Obstructive Cardiomyopathy, s/p Septal Myomectomy:  Echo in 10/2014 fairly stable.    5.  S/p Mitral Valve Repair:  As noted, Echo in Feb with fairly stable MV gradients.  Continue SBE prophylaxis.    6.  Chronic Combined Systolic and Diastolic CHF:   Recently seen in ED for increased dyspnea and LE edema.  BNP was 192 and CXR was neg.  She had her device interrogated with normal function.  Edema and breathing are back to baseline. We discussed the importance of daily weights and when to take extra Lasix and potassium. She  does have some chronic LE edema that is likely related to venous insufficiency as well. I have recommended TED hose.  7. Hypothyroidism:  Continue Synthroid. Follow-up TSH pending.  8. Spider Bite: She has an area of erythema on her right lower back that appears to be consistent with some type of insect sting or bite. I have advised her to continue to apply topical antibiotics. Follow-up  with primary care if this does not improve over the next several days.     Medication Adjustments: Current medicines are reviewed at length with the patient today.  Any concerns regarding medications are listed above. The following changes have been made:  Discontinued Medications   No medications on file   New Prescriptions   No medications on file   Modified Medications   No medications on file   Labs/ tests ordered today include:    No orders of the defined types were placed in this encounter.     Disposition:    FU Dr. Tonny Bollman 12/16 and Dr. Lewayne Bunting as planned.     Signed, Brynda Rim, MHS 07/07/2015 8:57 AM    [facility] [address] [place], [place], [place]  [address] Phone: [phone]; Fax: [phone]

## 2015-07-07 ENCOUNTER — Encounter: Payer: Self-pay | Admitting: Physician Assistant

## 2015-07-07 ENCOUNTER — Other Ambulatory Visit: Payer: Self-pay | Admitting: Family Medicine

## 2015-07-07 ENCOUNTER — Telehealth: Payer: Self-pay

## 2015-07-07 ENCOUNTER — Ambulatory Visit (INDEPENDENT_AMBULATORY_CARE_PROVIDER_SITE_OTHER): Payer: Medicare Other | Admitting: Physician Assistant

## 2015-07-07 VITALS — BP 90/54 | HR 75 | Ht 61.0 in | Wt 250.0 lb

## 2015-07-07 DIAGNOSIS — Z95810 Presence of automatic (implantable) cardiac defibrillator: Secondary | ICD-10-CM | POA: Diagnosis not present

## 2015-07-07 DIAGNOSIS — I472 Ventricular tachycardia, unspecified: Secondary | ICD-10-CM

## 2015-07-07 DIAGNOSIS — E039 Hypothyroidism, unspecified: Secondary | ICD-10-CM

## 2015-07-07 DIAGNOSIS — I480 Paroxysmal atrial fibrillation: Secondary | ICD-10-CM | POA: Diagnosis not present

## 2015-07-07 DIAGNOSIS — I5042 Chronic combined systolic (congestive) and diastolic (congestive) heart failure: Secondary | ICD-10-CM

## 2015-07-07 DIAGNOSIS — T148 Other injury of unspecified body region: Secondary | ICD-10-CM

## 2015-07-07 DIAGNOSIS — W57XXXA Bitten or stung by nonvenomous insect and other nonvenomous arthropods, initial encounter: Secondary | ICD-10-CM

## 2015-07-07 DIAGNOSIS — E032 Hypothyroidism due to medicaments and other exogenous substances: Secondary | ICD-10-CM

## 2015-07-07 DIAGNOSIS — Z9889 Other specified postprocedural states: Secondary | ICD-10-CM

## 2015-07-07 NOTE — Telephone Encounter (Signed)
Ok to refill 

## 2015-07-07 NOTE — Telephone Encounter (Signed)
plz phone in. 

## 2015-07-07 NOTE — Telephone Encounter (Signed)
Rx called in as directed.   

## 2015-07-07 NOTE — Telephone Encounter (Signed)
[name] with [facility] [address] said pt is using more proair than twice a day; guidelines at providers discretion are 1 puff q 4-6 h prn as rescue inhaler. [name] said for pt to be able to get refill will need new rx with different instructions. Please advise. Unable to reach pt to see how often she is using inhaler.Please advise.

## 2015-07-07 NOTE — Telephone Encounter (Signed)
plz contact patient - how much proair is she using? This should be rescue medicine. If regularly needing we need to discuss controller medication to help control asthma better.

## 2015-07-07 NOTE — Patient Instructions (Signed)
Medication Instructions:  Your physician recommends that you continue on your current medications as directed. Please refer to the Current Medication list given to you today.   Labwork: NONE  Testing/Procedures: NONE  Follow-Up: Your physician recommends that you schedule a follow-up appointment in: 08/2015 WITH DR. Excell Seltzer   Any Other Special Instructions Will Be Listed Below (If Applicable). RX FOR TED HOSE  IF SPOT ON BACK DOES NOT GET BETTER Monday YOU WILL NEED TO FOLLOW UP WITH PRIMARY CARE, SCOTT DID SAY TO CONTINUE WITH THE NEOSPORIN ON SPOT  MAKE SURE TO WEIGH DAILY AND IF WEIGHT IS UP BY 2-3 LB'S IN 1 DAY THEN TAKE EXTRA LASIX AND EXTRA POTASSIUM THAT DAY

## 2015-07-10 ENCOUNTER — Ambulatory Visit: Payer: Medicare Other | Admitting: Licensed Clinical Social Worker

## 2015-07-10 NOTE — Telephone Encounter (Signed)
Pt returned your call She stated she is taking 2 puff in am and during the day it verys from 2-4 puffs.

## 2015-07-10 NOTE — Telephone Encounter (Signed)
Left v/m for pt to cb. 

## 2015-07-10 NOTE — Telephone Encounter (Signed)
Spoke with patient and explained that if she was needing it that frequently along with her Qvar, then she may need another inhaler. She said her dr in [place] told her that she had exercise induced asthma and to use it 30 minutes prior to exertion to help avoid attacks. She uses it first thing in the AM because she is very active in the morning with cleaning her house,etc. She uses it in the evening because she is trying to start walking for exercise and will need it prior to that.

## 2015-07-11 ENCOUNTER — Telehealth: Payer: Self-pay | Admitting: Family Medicine

## 2015-07-11 NOTE — Telephone Encounter (Signed)
Pt called stating that she wants a referral to a pain management clinic.  She also wants a letter stating that she is being weaned off of xanax and to continue pain medications.  Cb number is [phone] Thank you

## 2015-07-11 NOTE — Telephone Encounter (Signed)
plz clarify with patient - I spoke with Dr Nilsa Nutting her current pain management MD who was ok with current anxiety treatment regimen - should not need another letter stating same?

## 2015-07-12 ENCOUNTER — Ambulatory Visit (INDEPENDENT_AMBULATORY_CARE_PROVIDER_SITE_OTHER): Payer: Medicare Other | Admitting: Licensed Clinical Social Worker

## 2015-07-12 DIAGNOSIS — F331 Major depressive disorder, recurrent, moderate: Secondary | ICD-10-CM | POA: Diagnosis not present

## 2015-07-12 DIAGNOSIS — F441 Dissociative fugue: Secondary | ICD-10-CM | POA: Diagnosis not present

## 2015-07-12 MED ORDER — ALBUTEROL SULFATE HFA 108 (90 BASE) MCG/ACT IN AERS: 2.0000 | INHALATION_SPRAY | Freq: Three times a day (TID) | RESPIRATORY_TRACT | Status: DC | PRN

## 2015-07-12 NOTE — Telephone Encounter (Signed)
Message left for patient to return my call.  

## 2015-07-12 NOTE — Telephone Encounter (Signed)
Have refilled for now. But recommend try to decrease use - decrease to 1 puff at a time TID then slowly back down. If more trouble with taper rec come into office to discuss. Otherwise let's discuss at next office visit  Make sure she's regularly taking her qvar.

## 2015-07-13 ENCOUNTER — Other Ambulatory Visit: Payer: Self-pay | Admitting: Neurology

## 2015-07-13 ENCOUNTER — Telehealth: Payer: Self-pay | Admitting: Neurology

## 2015-07-13 MED ORDER — ALBUTEROL SULFATE HFA 108 (90 BASE) MCG/ACT IN AERS: INHALATION_SPRAY | RESPIRATORY_TRACT | Status: AC

## 2015-07-13 NOTE — Telephone Encounter (Signed)
Pt called to inform that she is having trouble getting a refill of/ Gabapentin//call back [phone]

## 2015-07-13 NOTE — Telephone Encounter (Signed)
Lmovm to rtn my call. 

## 2015-07-14 MED ORDER — GABAPENTIN 300 MG PO CAPS: 600.0000 mg | ORAL_CAPSULE | Freq: Three times a day (TID) | ORAL | Status: AC

## 2015-07-14 NOTE — Telephone Encounter (Signed)
Patient notified and was just afraid that if she didn't have a letter, he wouldn't refill her meds. I assured her there shouldn't be an issue, but to call me if there was.

## 2015-07-14 NOTE — Telephone Encounter (Signed)
Patient notified and verbalized understanding. She is taking Qvar regularly.

## 2015-07-14 NOTE — Telephone Encounter (Signed)
I spoke with patient. She wanted to make sure Rx for Gabapentin was sent in. Her last ov was on 9/30.

## 2015-07-17 ENCOUNTER — Other Ambulatory Visit: Payer: BLUE CROSS/BLUE SHIELD

## 2015-07-17 ENCOUNTER — Other Ambulatory Visit (INDEPENDENT_AMBULATORY_CARE_PROVIDER_SITE_OTHER): Payer: Medicare Other

## 2015-07-17 DIAGNOSIS — E0590 Thyrotoxicosis, unspecified without thyrotoxic crisis or storm: Secondary | ICD-10-CM

## 2015-07-17 DIAGNOSIS — E032 Hypothyroidism due to medicaments and other exogenous substances: Secondary | ICD-10-CM

## 2015-07-17 LAB — HEPATIC FUNCTION PANEL
ALBUMIN: 4 g/dL (ref 3.6–5.1)
ALT: 62 U/L — AB (ref 6–29)
AST: 38 U/L — ABNORMAL HIGH (ref 10–30)
Alkaline Phosphatase: 56 U/L (ref 33–115)
BILIRUBIN INDIRECT: 0.6 mg/dL (ref 0.2–1.2)
Bilirubin, Direct: 0.1 mg/dL (ref <=0.2)
TOTAL PROTEIN: 7.5 g/dL (ref 6.1–8.1)
Total Bilirubin: 0.7 mg/dL (ref 0.2–1.2)

## 2015-07-17 LAB — T4, FREE: Free T4: 0.8 ng/dL (ref 0.60–1.60)

## 2015-07-17 LAB — TSH: TSH: 7.74 u[IU]/mL — AB (ref 0.35–4.50)

## 2015-07-18 ENCOUNTER — Telehealth: Payer: Self-pay | Admitting: *Deleted

## 2015-07-18 DIAGNOSIS — E039 Hypothyroidism, unspecified: Secondary | ICD-10-CM

## 2015-07-18 DIAGNOSIS — E0590 Thyrotoxicosis, unspecified without thyrotoxic crisis or storm: Secondary | ICD-10-CM

## 2015-07-18 MED ORDER — LEVOTHYROXINE SODIUM 137 MCG PO TABS: 137.0000 ug | ORAL_TABLET | Freq: Every day | ORAL | Status: DC

## 2015-07-18 NOTE — Telephone Encounter (Signed)
Pt notified of lab results and med changes by phone with verbal understanding. Pt advised to f/u PCP about eleavated LFT. A new Rx sent in today for Synthroid 137 mcg daily. repeat labs 12/16

## 2015-07-20 ENCOUNTER — Encounter: Payer: Self-pay | Admitting: Family Medicine

## 2015-07-20 ENCOUNTER — Ambulatory Visit (INDEPENDENT_AMBULATORY_CARE_PROVIDER_SITE_OTHER): Payer: Medicare Other | Admitting: Family Medicine

## 2015-07-20 VITALS — BP 118/78 | HR 84 | Temp 98.3°F | Wt 248.8 lb

## 2015-07-20 DIAGNOSIS — E039 Hypothyroidism, unspecified: Secondary | ICD-10-CM

## 2015-07-20 DIAGNOSIS — R7401 Elevation of levels of liver transaminase levels: Secondary | ICD-10-CM

## 2015-07-20 DIAGNOSIS — R740 Nonspecific elevation of levels of transaminase and lactic acid dehydrogenase [LDH]: Secondary | ICD-10-CM | POA: Diagnosis not present

## 2015-07-20 DIAGNOSIS — K760 Fatty (change of) liver, not elsewhere classified: Secondary | ICD-10-CM | POA: Insufficient documentation

## 2015-07-20 NOTE — Assessment & Plan Note (Signed)
Continue to encourage weight loss. Pt states mediterranean diet is unaffordable. Considering returning to gym.

## 2015-07-20 NOTE — Patient Instructions (Addendum)
Liver function remaining mildly elevated - let's check other labs to rule out other causes - but likely amiodarone. Return over next few weeks for fasting labs.

## 2015-07-20 NOTE — Progress Notes (Signed)
Pre visit review using our clinic review tool, if applicable. No additional management support is needed unless otherwise documented below in the visit note. 

## 2015-07-20 NOTE — Assessment & Plan Note (Signed)
Already has f/u TFTs scheduled for 08/2015.

## 2015-07-20 NOTE — Assessment & Plan Note (Signed)
Discussed with patient different etiologies - anticipate amiodarone related, but possibly fatty liver.  Will return fasting for labs - chol levels, viral hep panel, iron studies. If persistently elevated LFTs on next check, will obtain abd us.

## 2015-07-20 NOTE — Progress Notes (Signed)
BP 118/78 mmHg  Pulse 84  Temp(Src) 98.3 F (36.8 C) (Oral)  Wt 248 lb 12 oz (112.832 kg)  LMP 06/25/2015   CC: discuss abnormal labs  Subjective:    Patient ID: [name], female    DOB: 09/29/1971, 44 y.o.   MRN: [id number]  HPI: [name] is a 44 y.o. female presenting on 07/20/2015 for Follow-up    Seen by cardiology - with mild transaminitis stable over 2 weeks attributed to amiodarone, and found to have low thyroid levels so levothyroxine was increased to 137mcg daily (07/17/2015). Pending f/u in December.  Amiodarone is being weaned down. Down to 200mg  BID x4d, 200mg  daily x1d. She has done remarkably well on this medicine. Last shock was 6 months ago.   Relevant past medical, surgical, family and social history reviewed and updated as indicated. Interim medical history since our last visit reviewed. Allergies and medications reviewed and updated. Current Outpatient Prescriptions on File Prior to Visit  Medication Sig  . acetaminophen (TYLENOL) 500 MG tablet Take 500-1,000 mg by mouth 2 (two) times daily as needed for mild pain, moderate pain, fever or headache (pain).   Marland Kitchen. albuterol (PROVENTIL HFA;VENTOLIN HFA) 108 (90 BASE) MCG/ACT inhaler 2 puffs TID daily PRN (Fill whichever her insurance will cover-albuterol, Proair, Proventil, Ventolin)  . ALPRAZolam (XANAX) 1 MG tablet Take 0.5-1 tablets (0.5-1 mg total) by mouth 2 (two) times daily as needed.  Marland Kitchen. amiodarone (PACERONE) 200 MG tablet 400mg  daily (two 200mg  tablets) on Monday, Tuesday, Wednesday and Thursday and Amiodarone 200mg  daily on Friday, Saturday and Sunday  . amoxicillin (AMOXIL) 500 MG capsule Take 500 mg by mouth 2 (two) times daily. DENTAL PROCEDURES ONLY  . beclomethasone (QVAR) 80 MCG/ACT inhaler Inhale 1 puff into the lungs 2 (two) times daily.  . cetirizine (ZYRTEC) 10 MG tablet Take 10 mg by mouth daily.   Marland Kitchen. docusate sodium (COLACE) 100 MG capsule Take 100 mg by mouth 2 (two) times daily as needed  for mild constipation.  Marland Kitchen. ELIQUIS 5 MG TABS tablet TAKE ONE TABLET BY MOUTH TWICE DAILY  . furosemide (LASIX) 40 MG tablet Pt may take 1 extra tablet daily as needed for swelling (Takes an extra potassium when she does this)  . gabapentin (NEURONTIN) 300 MG capsule Take 2 capsules (600 mg total) by mouth 3 (three) times daily.  Marland Kitchen. levonorgestrel (MIRENA) 20 MCG/24HR IUD 1 each by Intrauterine route once.  Marland Kitchen. levothyroxine (SYNTHROID, LEVOTHROID) 137 MCG tablet Take 1 tablet (137 mcg total) by mouth daily before breakfast.  . Magnesium Gluconate 250 MG TABS Take 500 mg by mouth 2 (two) times daily.  . metoprolol succinate (TOPROL-XL) 25 MG 24 hr tablet Take 3 tablets (75 mg total) by mouth 2 (two) times daily.  Marland Kitchen. mexiletine (MEXITIL) 150 MG capsule Take 1 capsule (150 mg total) by mouth 3 (three) times daily.  . Olopatadine HCl (PATANASE) 0.6 % SOLN Place 1 puff into the nose daily as needed (for allergies).   Marland Kitchen. omeprazole (PRILOSEC) 40 MG capsule Take 1 capsule (40 mg total) by mouth daily. For 3 weeks then as needed  . ondansetron (ZOFRAN) 4 MG tablet Take 4 mg by mouth 2 (two) times daily as needed. EVERY 12 HOURS AS NEEDED BY MOUTH FOR NAUSEA  . oxyCODONE-acetaminophen (PERCOCET) 7.5-325 MG per tablet Take 1 tablet by mouth every 4 (four) hours as needed (pain).   . Potassium Gluconate 595 MG CAPS Take 1 capsule (595 mg total) by mouth daily.  OK to take extra on days you need extra lasix  . ranolazine (RANEXA) 1000 MG SR tablet Take 1 tablet (1,000 mg total) by mouth 2 (two) times daily.  . sertraline (ZOLOFT) 100 MG tablet Take 1 tablet (100 mg total) by mouth daily.   No current facility-administered medications on file prior to visit.    Review of Systems Per HPI unless specifically indicated above     Objective:    BP 118/78 mmHg  Pulse 84  Temp(Src) 98.3 F (36.8 C) (Oral)  Wt 248 lb 12 oz (112.832 kg)  LMP 06/25/2015  Wt Readings from Last 3 Encounters:  07/20/15 248 lb 12 oz  (112.832 kg)  07/07/15 250 lb (113.399 kg)  07/04/15 246 lb 12 oz (111.925 kg)   Body mass index is 47.03 kg/(m^2).  Physical Exam  Constitutional: She appears well-developed and well-nourished. No distress.  Psychiatric: She has a normal mood and affect.  Nursing note and vitals reviewed.  Results for orders placed or performed in visit on 07/17/15  TSH  Result Value Ref Range   TSH 7.74 (H) 0.35 - 4.50 uIU/mL  T4, free  Result Value Ref Range   Free T4 0.80 0.60 - 1.60 ng/dL  Hepatic function panel  Result Value Ref Range   Total Bilirubin 0.7 0.2 - 1.2 mg/dL   Bilirubin, Direct 0.1 <=0.2 mg/dL   Indirect Bilirubin 0.6 0.2 - 1.2 mg/dL   Alkaline Phosphatase 56 33 - 115 U/L   AST 38 (H) 10 - 30 U/L   ALT 62 (H) 6 - 29 U/L   Total Protein 7.5 6.1 - 8.1 g/dL   Albumin 4.0 3.6 - 5.1 g/dL      Assessment & Plan:   Problem List Items Addressed This Visit    Transaminitis - Primary    Discussed with patient different etiologies - anticipate amiodarone related, but possibly fatty liver.  Will return fasting for labs - chol levels, viral hep panel, iron studies. If persistently elevated LFTs on next check, will obtain abd [place].      Relevant Orders   IBC panel   Hepatitis panel, acute   Lipid panel   Severe obesity (BMI >= 40) (HCC)    Continue to encourage weight loss. Pt states mediterranean diet is unaffordable. Considering returning to gym.      Relevant Orders   Lipid panel   Hypothyroidism (Chronic)    Already has f/u TFTs scheduled for 08/2015.          Follow up plan: No Follow-up on file.

## 2015-07-21 ENCOUNTER — Other Ambulatory Visit: Payer: Self-pay | Admitting: Family Medicine

## 2015-07-24 ENCOUNTER — Other Ambulatory Visit: Payer: BLUE CROSS/BLUE SHIELD

## 2015-07-25 ENCOUNTER — Ambulatory Visit: Payer: Medicare Other | Admitting: Licensed Clinical Social Worker

## 2015-07-26 ENCOUNTER — Ambulatory Visit: Payer: Medicare Other | Admitting: Licensed Clinical Social Worker

## 2015-07-27 ENCOUNTER — Encounter: Payer: Self-pay | Admitting: Family Medicine

## 2015-07-31 ENCOUNTER — Other Ambulatory Visit (INDEPENDENT_AMBULATORY_CARE_PROVIDER_SITE_OTHER): Payer: Medicare Other

## 2015-07-31 DIAGNOSIS — E039 Hypothyroidism, unspecified: Secondary | ICD-10-CM | POA: Diagnosis not present

## 2015-07-31 DIAGNOSIS — R7401 Elevation of levels of liver transaminase levels: Secondary | ICD-10-CM

## 2015-07-31 DIAGNOSIS — R740 Nonspecific elevation of levels of transaminase and lactic acid dehydrogenase [LDH]: Principal | ICD-10-CM

## 2015-07-31 LAB — LIPID PANEL
CHOL/HDL RATIO: 4
Cholesterol: 218 mg/dL — ABNORMAL HIGH (ref 0–200)
HDL: 52.5 mg/dL (ref >39.00)
NonHDL: 165.52
Triglycerides: 230 mg/dL — ABNORMAL HIGH (ref 0.0–149.0)
VLDL: 46 mg/dL — AB (ref 0.0–40.0)

## 2015-07-31 LAB — IBC PANEL
Iron: 86 ug/dL (ref 42–145)
SATURATION RATIOS: 17.5 % — AB (ref 20.0–50.0)
Transferrin: 352 mg/dL (ref 212.0–360.0)

## 2015-07-31 LAB — LDL CHOLESTEROL, DIRECT: LDL DIRECT: 142 mg/dL

## 2015-08-01 LAB — HEPATITIS PANEL, ACUTE
HCV AB: NEGATIVE
Hep A IgM: NONREACTIVE
Hep B C IgM: NONREACTIVE
Hepatitis B Surface Ag: NEGATIVE

## 2015-08-02 ENCOUNTER — Telehealth: Payer: Self-pay | Admitting: Internal Medicine

## 2015-08-02 NOTE — Telephone Encounter (Signed)
LMOVM for pt informing pt that her transmission was received.

## 2015-08-02 NOTE — Telephone Encounter (Signed)
New message     Pt received call that we received her transmission----have the doctor read it?  Did it show anything?

## 2015-08-02 NOTE — Telephone Encounter (Signed)
LMOVM w/ direct # to device clinic.  

## 2015-08-02 NOTE — Telephone Encounter (Signed)
New message ° ° ° ° ° °1. Has your device fired? no °2. Is you device beeping? no ° °3. Are you experiencing draining or swelling at device site? no ° °4. Are you calling to see if we received your device transmission?yes °5. Have you passed out? no °

## 2015-08-07 ENCOUNTER — Ambulatory Visit (INDEPENDENT_AMBULATORY_CARE_PROVIDER_SITE_OTHER): Payer: Medicare Other | Admitting: Family Medicine

## 2015-08-07 ENCOUNTER — Encounter: Payer: Self-pay | Admitting: Family Medicine

## 2015-08-07 VITALS — BP 114/62 | HR 80 | Temp 98.1°F | Wt 251.2 lb

## 2015-08-07 DIAGNOSIS — I4892 Unspecified atrial flutter: Secondary | ICD-10-CM | POA: Diagnosis not present

## 2015-08-07 DIAGNOSIS — R110 Nausea: Secondary | ICD-10-CM | POA: Diagnosis not present

## 2015-08-07 DIAGNOSIS — E039 Hypothyroidism, unspecified: Secondary | ICD-10-CM | POA: Diagnosis not present

## 2015-08-07 DIAGNOSIS — R740 Nonspecific elevation of levels of transaminase and lactic acid dehydrogenase [LDH]: Secondary | ICD-10-CM

## 2015-08-07 DIAGNOSIS — R7401 Elevation of levels of liver transaminase levels: Secondary | ICD-10-CM

## 2015-08-07 NOTE — Progress Notes (Signed)
BP 114/62 mmHg  Pulse 80  Temp(Src) 98.1 F (36.7 C) (Oral)  Wt 251 lb 4 oz (113.966 kg)   CC: f/u labs  Subjective:    Patient ID: [name], female    DOB: [birth year], 44 y.o.   MRN: [id number]  HPI: [name] is a 44 y.o. female presenting on 08/07/2015 for Follow-up   See prior note for details - mild transaminitis found 3 wks ago by cardiology. Workup unrevealing - neg viral hep panel and normal iron levels.    Felt bad this morning with nausea weakness and sweats but no vomiting. Treated with zofran, xanax, nap. No fevers, diarrhea, abd pain, coughing, dyspnea, chest pain or palpitations, dizziness.   TSH improving - f/u planned in December.  Relevant past medical, surgical, family and social history reviewed and updated as indicated. Interim medical history since our last visit reviewed. Allergies and medications reviewed and updated. Current Outpatient Prescriptions on File Prior to Visit  Medication Sig  . acetaminophen (TYLENOL) 500 MG tablet Take 500-1,000 mg by mouth 2 (two) times daily as needed for mild pain, moderate pain, fever or headache (pain).   Marland Kitchen albuterol (PROVENTIL HFA;VENTOLIN HFA) 108 (90 BASE) MCG/ACT inhaler 2 puffs TID daily PRN (Fill whichever her insurance will cover-albuterol, Proair, Proventil, Ventolin)  . ALPRAZolam (XANAX) 1 MG tablet Take 0.5-1 tablets (0.5-1 mg total) by mouth 2 (two) times daily as needed.  Marland Kitchen amiodarone (PACERONE) 200 MG tablet  daily (two  tablets) on Monday, Tuesday, Wednesday and Thursday and Amiodarone  daily on Friday, Saturday and Sunday  . beclomethasone (QVAR) 80 MCG/ACT inhaler Inhale 1 puff into the lungs 2 (two) times daily.  . cetirizine (ZYRTEC) 10 MG tablet Take 10 mg by mouth daily.   Marland Kitchen docusate sodium (COLACE) 100 MG capsule Take 100 mg by mouth 2 (two) times daily as needed for mild constipation.  Marland Kitchen ELIQUIS 5 MG TABS tablet TAKE ONE TABLET BY MOUTH TWICE DAILY  . furosemide (LASIX) 40 MG  tablet Pt may take 1 extra tablet daily as needed for swelling (Takes an extra potassium when she does this)  . gabapentin (NEURONTIN) 300 MG capsule Take 2 capsules (600 mg total) by mouth 3 (three) times daily.  Marland Kitchen levonorgestrel (MIRENA) 20 MCG/24HR IUD 1 each by Intrauterine route once.  Marland Kitchen levothyroxine (SYNTHROID, LEVOTHROID) 137 MCG tablet Take 1 tablet (137 mcg total) by mouth daily before breakfast.  . Magnesium Gluconate 250 MG TABS Take 500 mg by mouth 2 (two) times daily.  . metoprolol succinate (TOPROL-XL) 25 MG 24 hr tablet Take 3 tablets (75 mg total) by mouth 2 (two) times daily.  Marland Kitchen mexiletine (MEXITIL) 150 MG capsule Take 1 capsule (150 mg total) by mouth 3 (three) times daily.  . Olopatadine HCl (PATANASE) 0.6 % SOLN Place 1 puff into the nose daily as needed (for allergies).   Marland Kitchen omeprazole (PRILOSEC) 40 MG capsule Take 1 capsule (40 mg total) by mouth daily as needed.  . ondansetron (ZOFRAN) 4 MG tablet Take 4 mg by mouth 2 (two) times daily as needed. EVERY 12 HOURS AS NEEDED BY MOUTH FOR NAUSEA  . oxyCODONE-acetaminophen (PERCOCET) 7.5-325 MG per tablet Take 1 tablet by mouth every 4 (four) hours as needed (pain).   . Potassium Gluconate 595 MG CAPS Take 1 capsule (595 mg total) by mouth daily. OK to take extra on days you need extra lasix  . ranolazine (RANEXA) 1000 MG SR tablet Take 1 tablet (1,000 mg total)  by mouth 2 (two) times daily.  . sertraline (ZOLOFT) 100 MG tablet Take 1 tablet (100 mg total) by mouth daily.  Marland Kitchen. amoxicillin (AMOXIL) 500 MG capsule Take 500 mg by mouth 2 (two) times daily. DENTAL PROCEDURES ONLY   No current facility-administered medications on file prior to visit.    Review of Systems Per HPI unless specifically indicated in ROS section     Objective:    BP 114/62 mmHg  Pulse 80  Temp(Src) 98.1 F (36.7 C) (Oral)  Wt 251 lb 4 oz (113.966 kg)  Wt Readings from Last 3 Encounters:  08/07/15 251 lb 4 oz (113.966 kg)  07/20/15 248 lb 12 oz  (112.832 kg)  07/07/15 250 lb (113.399 kg)   Body mass index is 47.5 kg/(m^2).  Physical Exam  Constitutional: She appears well-developed and well-nourished. No distress.  HENT:  Mouth/Throat: Oropharynx is clear and moist. No oropharyngeal exudate.  Cardiovascular: Normal rate, regular rhythm and intact distal pulses.   Murmur (3/6 systolic murmur) heard. Pulmonary/Chest: Effort normal and breath sounds normal. No respiratory distress. She has no wheezes. She has no rales.  Musculoskeletal: She exhibits no edema.  Skin: Skin is warm and dry. No rash noted.  Psychiatric: She has a normal mood and affect.  Nursing note and vitals reviewed.  Results for orders placed or performed in visit on 07/31/15  IBC panel  Result Value Ref Range   Iron 86 42 - 145 ug/dL   Transferrin 295.2352.0 841.3212.0 - 360.0 mg/dL   Saturation Ratios 24.417.5 (L) 20.0 - 50.0 %  Hepatitis panel, acute  Result Value Ref Range   Hepatitis B Surface Ag NEGATIVE NEGATIVE   HCV Ab NEGATIVE NEGATIVE   Hep B C IgM NON REACTIVE NON REACTIVE   Hep A IgM NON REACTIVE NON REACTIVE  Lipid panel  Result Value Ref Range   Cholesterol 218 (H) 0 - 200 mg/dL   Triglycerides 010.2230.0 (H) 0.0 - 149.0 mg/dL   HDL 72.5352.50 >66.44>39.00 mg/dL   VLDL 03.446.0 (H) 0.0 - 74.240.0 mg/dL   Total CHOL/HDL Ratio 4    NonHDL 165.52   LDL cholesterol, direct  Result Value Ref Range   Direct LDL 142.0 mg/dL      Assessment & Plan:   Problem List Items Addressed This Visit    Transaminitis - Primary    Discussed fatty liver vs amiodarone related. Will check abd US. Pt continues slow taper of amio.       Relevant Orders   US Abdomen Complete   Nausea    Malaise this morning of unclear etiology. Now better. No evidence of cardiac cause today.      Relevant Orders   US Abdomen Complete   Hypothyroidism (Chronic)    On higher levothyroxine dose. Has f/u labs scheduled for next month.      Atrial flutter with rapid ventricular response (HCC)    Sounds  regular today. No changes. Appreciate EP care of patient.          Follow up plan: Return in about 4 months (around 12/05/2015), or as needed, for medicare wellness visit.

## 2015-08-07 NOTE — Assessment & Plan Note (Signed)
Discussed fatty liver vs amiodarone related. Will check abd US. Pt continues slow taper of amio.

## 2015-08-07 NOTE — Progress Notes (Signed)
Pre visit review using our clinic review tool, if applicable. No additional management support is needed unless otherwise documented below in the visit note. 

## 2015-08-07 NOTE — Patient Instructions (Addendum)
Cholesterol was too high - work on low cholesterol diet - handout provided today. Pass by our referral coordinators to schedule abdominal ultrasound.  Return in 3-4 months for medicare wellness visit

## 2015-08-07 NOTE — Assessment & Plan Note (Signed)
Sounds regular today. No changes. Appreciate EP care of patient.

## 2015-08-07 NOTE — Assessment & Plan Note (Signed)
Malaise this morning of unclear etiology. Now better. No evidence of cardiac cause today.

## 2015-08-07 NOTE — Assessment & Plan Note (Signed)
On higher levothyroxine dose. Has f/u labs scheduled for next month.

## 2015-08-08 ENCOUNTER — Ambulatory Visit
Admission: RE | Admit: 2015-08-08 | Discharge: 2015-08-08 | Disposition: A | Discharge status: Home / Self Care | Payer: Medicare Other | Source: Ambulatory Visit | Attending: Family Medicine | Admitting: Family Medicine

## 2015-08-08 DIAGNOSIS — R7401 Elevation of levels of liver transaminase levels: Secondary | ICD-10-CM

## 2015-08-08 DIAGNOSIS — R110 Nausea: Secondary | ICD-10-CM

## 2015-08-08 DIAGNOSIS — R740 Nonspecific elevation of levels of transaminase and lactic acid dehydrogenase [LDH]: Principal | ICD-10-CM

## 2015-08-09 ENCOUNTER — Encounter: Payer: Self-pay | Admitting: Family Medicine

## 2015-08-11 ENCOUNTER — Telehealth: Payer: Self-pay | Admitting: Family Medicine

## 2015-08-11 NOTE — Telephone Encounter (Signed)
Pt called and is requesting cb regarding results from last ultrasound cb number is [phone] Thank you

## 2015-08-14 ENCOUNTER — Telehealth: Payer: Self-pay | Admitting: Internal Medicine

## 2015-08-14 ENCOUNTER — Other Ambulatory Visit: Payer: Self-pay | Admitting: Family Medicine

## 2015-08-14 NOTE — Telephone Encounter (Signed)
plz look in imaging section - results there.

## 2015-08-14 NOTE — Telephone Encounter (Signed)
I am unable to find any notes in chart regarding US. Please advise.

## 2015-08-14 NOTE — Telephone Encounter (Signed)
Pt calling to get samples of Eliquis 5 mg. I informed pt that we did not have any samples of Eliquis 5 mg at this time and that she could call back later in the week to see if we have gotten any samples in. I also offered pt the medicare low-income subsidy program and pt stated that she has already applied and waiting to hear from them. I advised the pt that if she has any other problems, questions or concerns to call the office pt verbalized understanding.

## 2015-08-15 NOTE — Telephone Encounter (Signed)
plz phone in. 

## 2015-08-15 NOTE — Telephone Encounter (Signed)
Rx called in as directed.   

## 2015-08-15 NOTE — Telephone Encounter (Signed)
Patient notified

## 2015-08-21 ENCOUNTER — Other Ambulatory Visit: Payer: Self-pay | Admitting: *Deleted

## 2015-08-21 ENCOUNTER — Other Ambulatory Visit: Payer: Self-pay

## 2015-08-21 ENCOUNTER — Other Ambulatory Visit: Payer: Self-pay | Admitting: Internal Medicine

## 2015-08-21 DIAGNOSIS — E0590 Thyrotoxicosis, unspecified without thyrotoxic crisis or storm: Secondary | ICD-10-CM

## 2015-08-21 DIAGNOSIS — I472 Ventricular tachycardia, unspecified: Secondary | ICD-10-CM

## 2015-08-21 DIAGNOSIS — I4901 Ventricular fibrillation: Secondary | ICD-10-CM

## 2015-08-21 MED ORDER — FUROSEMIDE 40 MG PO TABS: ORAL_TABLET | ORAL | Status: AC

## 2015-08-21 MED ORDER — LEVOTHYROXINE SODIUM 137 MCG PO TABS: 137.0000 ug | ORAL_TABLET | Freq: Every day | ORAL | Status: DC

## 2015-08-21 MED ORDER — AMIODARONE HCL 200 MG PO TABS: ORAL_TABLET | ORAL | Status: AC

## 2015-08-21 MED ORDER — OMEPRAZOLE 40 MG PO CPDR: 40.0000 mg | DELAYED_RELEASE_CAPSULE | Freq: Every day | ORAL | Status: AC | PRN

## 2015-08-21 MED ORDER — METOPROLOL SUCCINATE ER 25 MG PO TB24: 75.0000 mg | ORAL_TABLET | Freq: Two times a day (BID) | ORAL | Status: AC

## 2015-08-21 MED ORDER — SERTRALINE HCL 100 MG PO TABS: 100.0000 mg | ORAL_TABLET | Freq: Every day | ORAL | Status: AC

## 2015-08-23 ENCOUNTER — Other Ambulatory Visit: Payer: Self-pay | Admitting: Internal Medicine

## 2015-08-23 NOTE — Telephone Encounter (Signed)
Spoke with [facility] to verifiy with pharmacist dosage of furosemide (LASIX) 40 MG tablet. Take 40 mg by mouth daily. Pt sometimes takes 80 mg by mouth daily for swelling issues (Takes an extra potassium when she does this) per office note Dr. Sharrell KuGreg Taylor 03/30/2015. TW CMA

## 2015-09-05 ENCOUNTER — Other Ambulatory Visit: Payer: Self-pay | Admitting: *Deleted

## 2015-09-05 ENCOUNTER — Other Ambulatory Visit: Payer: Self-pay

## 2015-09-05 DIAGNOSIS — I472 Ventricular tachycardia, unspecified: Secondary | ICD-10-CM

## 2015-09-05 DIAGNOSIS — I4901 Ventricular fibrillation: Secondary | ICD-10-CM

## 2015-09-05 DIAGNOSIS — E0590 Thyrotoxicosis, unspecified without thyrotoxic crisis or storm: Secondary | ICD-10-CM

## 2015-09-05 NOTE — Telephone Encounter (Signed)
Medication Detail      Disp Refills Start End     furosemide (LASIX) 40 MG tablet 180 tablet 3 08/21/2015     Sig: Pt may take 1 extra tablet daily as needed for swelling (Takes an extra potassium when she does this)    E-Prescribing Status: Receipt confirmed by pharmacy (08/21/2015 3:45 PM EST)     Pharmacy    [facility] [place] - [phone]     Medication Detail      Disp Refills Start End     omeprazole (PRILOSEC) 40 MG capsule 90 capsule 3 08/21/2015     Sig - Route: Take 1 capsule (40 mg total) by mouth daily as needed. - Oral    E-Prescribing Status: Receipt confirmed by pharmacy (08/21/2015 3:45 PM EST)     Pharmacy    [facility] [place] - [phone] [address]   Medication Detail      Disp Refills Start End     amiodarone (PACERONE) 200 MG tablet 135 tablet 3 08/21/2015     Sig: 400mg  daily (two 200mg  tablets) on Monday, Tuesday, Wednesday and Thursday and Amiodarone 200mg  daily on Friday, Saturday and Sunday    E-Prescribing Status: Receipt confirmed by pharmacy (08/21/2015 3:45 PM EST)     Associated Diagnoses    VT (ventricular tachycardia) [facility](HCC) - Primary      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 [place] [facility] [phone]

## 2015-09-11 ENCOUNTER — Other Ambulatory Visit: Payer: Self-pay | Admitting: Physician Assistant

## 2015-09-13 ENCOUNTER — Telehealth: Payer: Self-pay | Admitting: Internal Medicine

## 2015-09-13 NOTE — Telephone Encounter (Signed)
Manual remote received. No episodes or alerts. Pt thankful she is clear of events.

## 2015-09-13 NOTE — Telephone Encounter (Signed)
Pt thought she was shocked. Her latest transmission from 09/11/15 shows she has been event free. Pt states she thought she was shocked 6pm last night. She is currently not home. She will return home sometime today to send another manual transmission. She states she tried to send one this morning but is aware we have not received one since 09/11/15.   Pt also requesting eliquis samples. [place].

## 2015-09-13 NOTE — Telephone Encounter (Signed)
New message ° ° ° ° ° ° °1. Has your device fired?yes ° °2. Is you device beeping? no ° °3. Are you experiencing draining or swelling at device site? no ° °4. Are you calling to see if we received your device transmission? no ° °5. Have you passed out? no °

## 2015-09-13 NOTE — Telephone Encounter (Signed)
Follow up ° °Pt returned call  °

## 2015-09-14 ENCOUNTER — Telehealth: Payer: Self-pay | Admitting: *Deleted

## 2015-09-14 NOTE — Telephone Encounter (Signed)
Per staff message patient requested eliquis samples. Patient aware samples will be placed at the front desk.

## 2015-09-15 ENCOUNTER — Other Ambulatory Visit (INDEPENDENT_AMBULATORY_CARE_PROVIDER_SITE_OTHER): Payer: Medicare Other | Admitting: *Deleted

## 2015-09-15 ENCOUNTER — Other Ambulatory Visit: Payer: Self-pay | Admitting: Family Medicine

## 2015-09-15 DIAGNOSIS — E0590 Thyrotoxicosis, unspecified without thyrotoxic crisis or storm: Secondary | ICD-10-CM

## 2015-09-15 LAB — T4, FREE: Free T4: 1.12 ng/dL (ref 0.80–1.80)

## 2015-09-15 LAB — TSH: TSH: 10.577 u[IU]/mL — ABNORMAL HIGH (ref 0.350–4.500)

## 2015-09-15 NOTE — Addendum Note (Signed)
Addended by: Tonita PhoenixBOWDEN, ROBIN K on: 09/15/2015 08:11 AM   Modules accepted: Orders

## 2015-09-15 NOTE — Addendum Note (Signed)
Addended by: Tonita PhoenixBOWDEN, Trayden Brandy K on: 09/15/2015 08:10 AM   Modules accepted: Orders

## 2015-09-15 NOTE — Addendum Note (Signed)
Addended by: Almir Botts K on: 09/15/2015 08:11 AM   Modules accepted: Orders  

## 2015-09-17 ENCOUNTER — Encounter (HOSPITAL_BASED_OUTPATIENT_CLINIC_OR_DEPARTMENT_OTHER): Payer: Self-pay | Admitting: Emergency Medicine

## 2015-09-17 ENCOUNTER — Emergency Department (HOSPITAL_BASED_OUTPATIENT_CLINIC_OR_DEPARTMENT_OTHER)
Admission: EM | Admit: 2015-09-17 | Discharge: 2015-09-17 | Disposition: A | Discharge status: Home / Self Care | Payer: Medicare Other | Attending: Emergency Medicine | Admitting: Emergency Medicine

## 2015-09-17 DIAGNOSIS — Z7951 Long term (current) use of inhaled steroids: Secondary | ICD-10-CM | POA: Insufficient documentation

## 2015-09-17 DIAGNOSIS — H9201 Otalgia, right ear: Secondary | ICD-10-CM | POA: Diagnosis present

## 2015-09-17 DIAGNOSIS — J45909 Unspecified asthma, uncomplicated: Secondary | ICD-10-CM | POA: Diagnosis not present

## 2015-09-17 DIAGNOSIS — F419 Anxiety disorder, unspecified: Secondary | ICD-10-CM | POA: Insufficient documentation

## 2015-09-17 DIAGNOSIS — Z79899 Other long term (current) drug therapy: Secondary | ICD-10-CM | POA: Diagnosis not present

## 2015-09-17 DIAGNOSIS — Z8619 Personal history of other infectious and parasitic diseases: Secondary | ICD-10-CM | POA: Diagnosis not present

## 2015-09-17 DIAGNOSIS — Z792 Long term (current) use of antibiotics: Secondary | ICD-10-CM | POA: Insufficient documentation

## 2015-09-17 DIAGNOSIS — I4891 Unspecified atrial fibrillation: Secondary | ICD-10-CM | POA: Diagnosis not present

## 2015-09-17 DIAGNOSIS — H6091 Unspecified otitis externa, right ear: Secondary | ICD-10-CM | POA: Diagnosis not present

## 2015-09-17 DIAGNOSIS — I5042 Chronic combined systolic (congestive) and diastolic (congestive) heart failure: Secondary | ICD-10-CM | POA: Diagnosis not present

## 2015-09-17 DIAGNOSIS — Z87891 Personal history of nicotine dependence: Secondary | ICD-10-CM | POA: Diagnosis not present

## 2015-09-17 DIAGNOSIS — E039 Hypothyroidism, unspecified: Secondary | ICD-10-CM | POA: Insufficient documentation

## 2015-09-17 DIAGNOSIS — Z86718 Personal history of other venous thrombosis and embolism: Secondary | ICD-10-CM | POA: Insufficient documentation

## 2015-09-17 DIAGNOSIS — M797 Fibromyalgia: Secondary | ICD-10-CM | POA: Diagnosis not present

## 2015-09-17 DIAGNOSIS — Z862 Personal history of diseases of the blood and blood-forming organs and certain disorders involving the immune mechanism: Secondary | ICD-10-CM | POA: Insufficient documentation

## 2015-09-17 DIAGNOSIS — F329 Major depressive disorder, single episode, unspecified: Secondary | ICD-10-CM | POA: Insufficient documentation

## 2015-09-17 DIAGNOSIS — G8929 Other chronic pain: Secondary | ICD-10-CM | POA: Diagnosis not present

## 2015-09-17 MED ORDER — OFLOXACIN 0.3 % OT SOLN: 5.0000 [drp] | Freq: Two times a day (BID) | OTIC | Status: AC

## 2015-09-17 NOTE — Discharge Instructions (Signed)

## 2015-09-17 NOTE — ED Notes (Signed)
Ear pain x 3 days, associated with cough, took amox antibiotic she had left over from previous infection with no relief

## 2015-09-17 NOTE — ED Provider Notes (Signed)
CSN: [id number]     Arrival date & time 09/17/15  1242 History   First MD Initiated Contact with Patient 09/17/15 1343     Chief Complaint  Patient presents with  . Otalgia    HPI   44 year old female presents today with right ear pain. Patient reports symptoms started 3 days ago with sharp right ear pain, pain to manipulation of the external ear and minor discharge. Patient reports she took leftover amoxicillin from dental complications that did not improve her symptoms. Patient reports she's had a dry nonproductive cough that is worse at night, denies any rhinorrhea, congestion, close sick contacts, fever, chills, neck stiffness, swelling to the ear face and jaw or mouth. Patient has not tried any over-the-counter medications prior to arrival. Patient has no history of the same, has not seen healthcare provider for this acute episode. Patient denies any other complaints.  Past Medical History  Diagnosis Date  . Hypertrophic cardiomyopathy (HCC)     s/p septal myomectomy with implantable defibrillator 01/27/2012   . Anxiety state, unspecified   . Allergic rhinitis     to dogs  . Asthma   . Anemia     during pregnancy  . History of chicken pox   . Generalized headaches   . Ocular migraine     no HA  . Back pain     told cracked bone, vicodin didn't help, improved with percocets/muscle relaxants, no eval by prior PCP  . 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 DVT (deep venous thrombosis) (HCC) 01/2012    was placed on Warfarin after cardiac surgery but had skin reaction to warfarin so was changed to Pradaxa so DVT occurred while on Pradaxa although unclear if it occurred during transition  . Hypotension   . Atrial fibrillation (HCC)     a. On pradaxa.  coumadin necrosis with warfarin;  b. Amio d/c'd 02/2012  . Pericardial effusion   . Chronic combined systolic and diastolic CHF (congestive heart failure) (HCC)     a. echo 03/19/12:  severe asymmetric septal hypertrophy, evidence of upper septal myomectomy, peak LV outflow tract gradient 35, EF 35%,;  b.  Echo 9/13: Severe LVH, EF 30-35%, anteroseptal and inferior HK, LVOT narrow, mild AI, mild to moderate mitral stenosis, severe LAE  . Pericardial tamponade 03/14/2012  . 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 01/2012 s/p BSX ICD;  b. 06/2012 Multaq initiated due to recurrent syncope, VT/VF - had severe n/v with IV amio.  . Skin necrosis --COUMADIN   . Chronic chest wall pain     eval by CVTS, stable, rec return to [facility] if continued pain  . Syncope     a. in setting of VT/VF.  Marland Kitchen Hypothyroid   . Blood transfusion without reported diagnosis   . Neuromuscular disorder (HCC)   . Gall stones   . Atrial flutter (HCC)     a. 10/2013 with inappropriate ICD therapy; b. s/p CTI ablation by Dr Ladona Ridgel 11/16/2013;  c. 11/2013 syncope 2/2 aflutter/VT->failed DCCV->amio resumed w/ zofran for nausea.  Marland Kitchen PONV (postoperative nausea and vomiting)   . Depression   . Post-traumatic stress   . Fibromyalgia   . Hx of echocardiogram     Echo (2/16): mod LVH, EF 55-60%, no RWMA, Gr 2 DD, mild AI, mild to mod MS, mod LAE  . Fatty liver disease, nonalcoholic 2016    by abd [place]  Past Surgical History  Procedure Laterality Date  . Cesarean section  1991  . Induced abortion  1990 and 1993  . Cardiac surgery  01/27/2012    median sternotomy, septal myectomy, MVR - [facility] (Dr. Silvestre Mesi)  . Cardiac defibrillator placement  01/2012    BSX dual chamber ICD  . Myomectomy    . Pericardial window  03/14/2012    Procedure: PERICARDIAL WINDOW;  Surgeon: Purcell Nails, MD;  Location: [facility] OR;  Service: Open Heart Surgery;  Laterality: N/A;  Subxyphoid Pericardial  Window   . Ablation  11-16-13    RFCA of atrial flutter by Dr Ladona Ridgel   . Insert / replace / remove pacemaker    . Mitral valve repair    . Cholecystectomy N/A 01/26/2014    LAPAROSCOPIC CHOLECYSTECTOMY WITH INTRAOPERATIVE CHOLANGIOGRAM;   Surgeon: Wilmon Arms. Tsuei, MD  . Atrial flutter ablation N/A 11/16/2013    Procedure: ATRIAL FLUTTER ABLATION;  Surgeon: Marinus Maw, MD;  Location: [facility] CATH LAB;  Service: Cardiovascular;  Laterality: N/A;  . Cardioversion N/A 12/13/2014    Procedure: CARDIOVERSION;  Surgeon: Wendall Stade, MD;  Location: [facility] ENDOSCOPY;  Service: Cardiovascular;  Laterality: N/A;   Family History  Problem Relation Age of Onset  . Diabetes Father   . Heart disease Father     unspecified heart problem  . Thyroid disease Mother   . Anemia Mother     mediterranean anemia, ITP  . Heart disease Maternal Grandfather     CHF  . Hyperlipidemia Maternal Grandfather   . Diabetes Paternal Grandfather   . Cancer Paternal Grandfather   . Coronary artery disease Neg Hx   . Stroke Father   . Sudden death Neg Hx   . Heart attack Neg Hx    Social History  Substance Use Topics  . Smoking status: Former Smoker -- 1.00 packs/day for 3 years    Types: Cigarettes    Quit date: 09/30/1988  . Smokeless tobacco: Never Used     Comment: quitin 1990  . Alcohol Use: No     Comment: none since "months" as of 02/28/2013   OB History    No data available     Review of Systems  All other systems reviewed and are negative.     Allergies  Phenergan; Warfarin and related; and Nitroglycerin  Home Medications   Prior to Admission medications   Medication Sig Start Date End Date Taking? Authorizing Provider  albuterol (PROVENTIL HFA;VENTOLIN HFA) 108 (90 BASE) MCG/ACT inhaler 2 puffs TID daily PRN (Fill whichever her insurance will cover-albuterol, Proair, Proventil, Ventolin) 07/13/15  Yes Eustaquio Boyden, MD  ALPRAZolam Prudy Feeler) 1 MG tablet TAKE ONE-HALF TO ONE TABLET BY MOUTH TWICE DAILY AS NEEDED 08/15/15  Yes Eustaquio Boyden, MD  amiodarone (PACERONE) 200 MG tablet  daily (two  tablets) on Monday, Tuesday, Wednesday and Thursday and Amiodarone  daily on Friday, Saturday and Sunday 08/21/15  Yes  Marinus Maw, MD  amoxicillin (AMOXIL) 500 MG capsule Take 500 mg by mouth 2 (two) times daily. DENTAL PROCEDURES ONLY 02/28/15  Yes Historical Provider, MD  beclomethasone (QVAR) 80 MCG/ACT inhaler Inhale 1 puff into the lungs 2 (two) times daily.   Yes Historical Provider, MD  cetirizine (ZYRTEC) 10 MG tablet Take 10 mg by mouth daily.    Yes Historical Provider, MD  docusate sodium (COLACE) 100 MG capsule Take 100 mg by mouth 2 (two) times daily as needed for mild constipation.   Yes Historical Provider, MD  ELIQUIS 5 MG TABS tablet TAKE ONE TABLET BY MOUTH TWICE DAILY 09/11/15  Yes Tonny Bollman, MD  furosemide (LASIX) 40 MG tablet Pt may take 1 extra tablet daily as needed for swelling (Takes an extra potassium when she does this) 08/21/15  Yes Marinus Maw, MD  gabapentin (NEURONTIN) 300 MG capsule Take 2 capsules (600 mg total) by mouth 3 (three) times daily. 07/14/15  Yes Drema Dallas, DO  levothyroxine (SYNTHROID, LEVOTHROID) 137 MCG tablet Take 1 tablet (137 mcg total) by mouth daily before breakfast. 08/21/15  Yes Eustaquio Boyden, MD  Magnesium Gluconate 250 MG TABS Take 500 mg by mouth 2 (two) times daily.   Yes Historical Provider, MD  metoprolol succinate (TOPROL-XL) 25 MG 24 hr tablet Take 3 tablets (75 mg total) by mouth 2 (two) times daily. 08/21/15  Yes Tonny Bollman, MD  mexiletine (MEXITIL) 150 MG capsule Take 1 capsule (150 mg total) by mouth 3 (three) times daily. 06/14/15  Yes Marinus Maw, MD  Olopatadine HCl (PATANASE) 0.6 % SOLN Place 1 puff into the nose daily as needed (for allergies).    Yes Historical Provider, MD  omeprazole (PRILOSEC) 40 MG capsule Take 1 capsule (40 mg total) by mouth daily as needed. 08/21/15  Yes Marinus Maw, MD  ondansetron (ZOFRAN) 4 MG tablet Take 4 mg by mouth 2 (two) times daily as needed. EVERY 12 HOURS AS NEEDED BY MOUTH FOR NAUSEA 07/05/15  Yes Historical Provider, MD  oxyCODONE-acetaminophen (PERCOCET) 7.5-325 MG per tablet Take 1  tablet by mouth every 4 (four) hours as needed (pain).  10/13/14  Yes Historical Provider, MD  ranolazine (RANEXA) 1000 MG SR tablet Take 1 tablet (1,000 mg total) by mouth 2 (two) times daily. 04/17/15 04/16/16 Yes Tonny Bollman, MD  sertraline (ZOLOFT) 100 MG tablet Take 1 tablet (100 mg total) by mouth daily. 08/21/15  Yes Eustaquio Boyden, MD  acetaminophen (TYLENOL) 500 MG tablet Take 500-1,000 mg by mouth 2 (two) times daily as needed for mild pain, moderate pain, fever or headache (pain).     Historical Provider, MD  levonorgestrel (MIRENA) 20 MCG/24HR IUD 1 each by Intrauterine route once.    Historical Provider, MD  ofloxacin (FLOXIN) 0.3 % otic solution Place 5 drops into the right ear 2 (two) times daily. 09/17/15   Eyvonne Mechanic, PA-C  Potassium Gluconate 595 MG CAPS Take 1 capsule (595 mg total) by mouth daily. OK to take extra on days you need extra lasix 11/03/14   Beatrice Lecher, PA-C   BP 110/73 mmHg  Pulse 82  Temp(Src) 98.4 F (36.9 C) (Oral)  Resp 18  Ht 5' (1.524 m)  Wt 113.399 kg  BMI 48.82 kg/m2  SpO2 97%   Physical Exam  Constitutional: She is oriented to person, place, and time. She appears well-developed and well-nourished.  HENT:  Head: Normocephalic and atraumatic.  Right Ear: Hearing and tympanic membrane normal. No lacerations. There is drainage, swelling and tenderness. No foreign bodies. No mastoid tenderness. Tympanic membrane is not injected, not scarred, not perforated, not erythematous, not retracted and not bulging.  Left Ear: Hearing, tympanic membrane, external ear and ear canal normal. No lacerations. No foreign bodies. No mastoid tenderness. Tympanic membrane is not injected, not perforated, not erythematous, not retracted and not bulging.  Eyes: Conjunctivae are normal. Pupils are equal, round, and reactive to light. Right eye exhibits no discharge. Left eye exhibits no discharge. No scleral icterus.  Neck: Normal range of motion. No JVD present.  No  tracheal deviation present.  Pulmonary/Chest: Effort normal. No stridor.  Neurological: She is alert and oriented to person, place, and time. Coordination normal.  Psychiatric: She has a normal mood and affect. Her behavior is normal. Judgment and thought content normal.  Nursing note and vitals reviewed.   ED Course  Procedures (including critical care time) Labs Review Labs Reviewed - No data to display  Imaging Review No results found. I have personally reviewed and evaluated these images and lab results as part of my medical decision-making.   EKG Interpretation None      MDM   Final diagnoses:  Otitis externa, right    Labs:  Imaging:  Consults:  Therapeutics:  Discharge Meds:   Assessment/Plan: Patient's presentation is most consistent with otitis externa. She has no signs of otitis media, no significant erythema, swelling, tenderness to palpation of the surrounding soft tissues. Patient will be discharged home on topical antibiotics, encouraged follow-up with primary care in 2-3 days if symptoms do not improve, follow up sooner if they worsen. Patient verbalized understanding and agreement for today's plan and had no further questions or concerns at time of discharge         Eyvonne MechanicJeffrey Jenafer Winterton, PA-C 09/17/15 1420  Nelva Nayobert Beaton, MD 09/18/15 1246

## 2015-10-03 DIAGNOSIS — M542 Cervicalgia: Secondary | ICD-10-CM | POA: Diagnosis not present

## 2015-10-03 DIAGNOSIS — G890 Central pain syndrome: Secondary | ICD-10-CM | POA: Diagnosis not present

## 2015-10-03 DIAGNOSIS — Z79891 Long term (current) use of opiate analgesic: Secondary | ICD-10-CM | POA: Diagnosis not present

## 2015-10-03 DIAGNOSIS — G894 Chronic pain syndrome: Secondary | ICD-10-CM | POA: Diagnosis not present

## 2015-10-03 DIAGNOSIS — M25572 Pain in left ankle and joints of left foot: Secondary | ICD-10-CM | POA: Diagnosis not present

## 2015-10-03 DIAGNOSIS — R208 Other disturbances of skin sensation: Secondary | ICD-10-CM | POA: Diagnosis not present

## 2015-10-03 DIAGNOSIS — M5417 Radiculopathy, lumbosacral region: Secondary | ICD-10-CM | POA: Diagnosis not present

## 2015-10-03 DIAGNOSIS — G8929 Other chronic pain: Secondary | ICD-10-CM | POA: Diagnosis not present

## 2015-10-04 NOTE — Progress Notes (Signed)
Cardiology Office Note    Date:  10/05/2015   ID:  Priscella MannCindy A Duve, DOB 01/05/1971, MRN [id number]  PCP:  Eustaquio BoydenJavier Gutierrez, MD  Cardiologist:  Dr. Tonny BollmanMichael Cooper   Electrophysiologist:  Dr. Lewayne BuntingGregg Taylor   Chief Complaint  Patient presents with  . Follow-up  . Congestive Heart Failure    History of Present Illness:  Priscella MannCindy A Blinder is a 45 y.o. female with a hx of HOCM status post septal myomectomy and mitral valve repair [facility] in 12/2011, chronic combined systolic and diastolic HF. Postoperative course was complicated by VTach, AFib and LUE DVT. AICD was implanted. She had necrotic skin reaction on Coumadin and was transitioned to Pradaxa >> then Eliquis. 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 She ultimately underwent pericardial window by Dr. Cornelius Moraswen. She has had refractory VT/VF and atrial arrhythmias. She has failed medical Rx with multiple antiarrhythmics. She has had multiple episodes of appropriate and inappropriate ICD shocks.   Admitted 10/2013 with ICD shock in the setting of AFlutter >> s/p RFCA. Admitted 12/2013 with atrial tachycardia. Admitted in 08/2014 for ICD shock in the setting of ATach. Beta blocker was adjusted.   Admitted 3/16 with recurrent AFlutter. VR was under her detection zone of 240 and she was not shocked. DCCV >> NSR >> return of AFlutter. She was started on Amiodarone with return of NSR.   Last seen 07/07/15 by me.   She was overall stable at that time.  Returns for follow-up. She remains stable. She gets short of breath with moderate activities. She denies orthopnea or PND. She has LE edema mainly after prolonged standing. She denies chest pain, syncope, ICD discharges.    Past Medical History  Diagnosis Date  . Hypertrophic cardiomyopathy (HCC)     s/p septal myomectomy with implantable defibrillator 01/27/2012   . Anxiety state, unspecified   . Allergic rhinitis     to dogs  . Asthma   .  Anemia     during pregnancy  . History of chicken pox   . Generalized headaches   . Ocular migraine     no HA  . Back pain     told cracked bone, vicodin didn't help, improved with percocets/muscle relaxants, no eval by prior PCP  . 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 DVT (deep venous thrombosis) (HCC) 01/2012    was placed on Warfarin after cardiac surgery but had skin reaction to warfarin so was changed to Pradaxa so DVT occurred while on Pradaxa although unclear if it occurred during transition  . Hypotension   . Atrial fibrillation (HCC)     a. On pradaxa.  coumadin necrosis with warfarin;  b. Amio d/c'd 02/2012  . Pericardial effusion   . Chronic combined systolic and diastolic CHF (congestive heart failure) (HCC)     a. echo 03/19/12: severe asymmetric septal hypertrophy, evidence of upper septal myomectomy, peak LV outflow tract gradient 35, EF 35%,;  b.  Echo 9/13: Severe LVH, EF 30-35%, anteroseptal and inferior HK, LVOT narrow, mild AI, mild to moderate mitral stenosis, severe LAE  . Pericardial tamponade 03/14/2012  . 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 01/2012 s/p BSX ICD;  b. 06/2012 Multaq initiated due to recurrent syncope, VT/VF - had severe n/v with IV amio.  . Skin necrosis --COUMADIN   . Chronic chest wall pain     eval by  CVTS, stable, rec return to [facility] if continued pain  . Syncope     a. in setting of VT/VF.  Marland Kitchen Hypothyroid   . Blood transfusion without reported diagnosis   . Neuromuscular disorder (HCC)   . Gall stones   . Atrial flutter (HCC)     a. 10/2013 with inappropriate ICD therapy; b. s/p CTI ablation by Dr Ladona Ridgel 11/16/2013;  c. 11/2013 syncope 2/2 aflutter/VT->failed DCCV->amio resumed w/ zofran for nausea.  Marland Kitchen PONV (postoperative nausea and vomiting)   . Depression   . Post-traumatic stress   . Fibromyalgia   . Hx of echocardiogram     Echo (2/16): mod LVH, EF 55-60%, no RWMA, Gr 2 DD,  mild AI, mild to mod MS, mod LAE  . Fatty liver disease, nonalcoholic 2016    by abd [place]    Past Surgical History  Procedure Laterality Date  . Cesarean section  1991  . Induced abortion  1990 and 1993  . Cardiac surgery  01/27/2012    median sternotomy, septal myectomy, MVR - [facility] (Dr. Silvestre Mesi)  . Cardiac defibrillator placement  01/2012    BSX dual chamber ICD  . Myomectomy    . Pericardial window  03/14/2012    Procedure: PERICARDIAL WINDOW;  Surgeon: Purcell Nails, MD;  Location: [facility] OR;  Service: Open Heart Surgery;  Laterality: N/A;  Subxyphoid Pericardial  Window   . Ablation  11-16-13    RFCA of atrial flutter by Dr Ladona Ridgel   . Insert / replace / remove pacemaker    . Mitral valve repair    . Cholecystectomy N/A 01/26/2014    LAPAROSCOPIC CHOLECYSTECTOMY WITH INTRAOPERATIVE CHOLANGIOGRAM;  Surgeon: Wilmon Arms. Tsuei, MD  . Atrial flutter ablation N/A 11/16/2013    Procedure: ATRIAL FLUTTER ABLATION;  Surgeon: Marinus Maw, MD;  Location: [facility] CATH LAB;  Service: Cardiovascular;  Laterality: N/A;  . Cardioversion N/A 12/13/2014    Procedure: CARDIOVERSION;  Surgeon: Wendall Stade, MD;  Location: [facility] ENDOSCOPY;  Service: Cardiovascular;  Laterality: N/A;    Current Outpatient Prescriptions  Medication Sig Dispense Refill  . acetaminophen (TYLENOL) 500 MG tablet Take 500-1,000 mg by mouth 2 (two) times daily as needed for mild pain, moderate pain, fever or headache (pain).     Marland Kitchen albuterol (PROVENTIL HFA;VENTOLIN HFA) 108 (90 BASE) MCG/ACT inhaler 2 puffs TID daily PRN (Fill whichever her insurance will cover-albuterol, Proair, Proventil, Ventolin) 1 Inhaler 6  . ALPRAZolam (XANAX) 1 MG tablet TAKE ONE-HALF TO ONE TABLET BY MOUTH TWICE DAILY AS NEEDED 60 tablet 0  . amiodarone (PACERONE) 200 MG tablet 400mg  daily (two 200mg  tablets) on Monday, Tuesday, Wednesday and Thursday and Amiodarone 200mg  daily on Friday, Saturday and Sunday 135 tablet 3  . amoxicillin (AMOXIL) 500 MG capsule Take  500 mg by mouth 2 (two) times daily. DENTAL PROCEDURES ONLY  1  . beclomethasone (QVAR) 80 MCG/ACT inhaler Inhale 1 puff into the lungs 2 (two) times daily.    . cetirizine (ZYRTEC) 10 MG tablet Take 10 mg by mouth daily.     Marland Kitchen docusate sodium (COLACE) 100 MG capsule Take 100 mg by mouth 2 (two) times daily as needed for mild constipation.    Marland Kitchen ELIQUIS 5 MG TABS tablet TAKE ONE TABLET BY MOUTH TWICE DAILY 60 tablet 5  . furosemide (LASIX) 40 MG tablet Pt may take 1 extra tablet daily as needed for swelling (Takes an extra potassium when she does this) 180 tablet 3  . gabapentin (NEURONTIN) 300  MG capsule Take 2 capsules (600 mg total) by mouth 3 (three) times daily. 180 capsule 5  . levonorgestrel (MIRENA) 20 MCG/24HR IUD 1 each by Intrauterine route once.    Marland Kitchen levothyroxine (SYNTHROID, LEVOTHROID) 137 MCG tablet Take 1 tablet (137 mcg total) by mouth daily before breakfast. 90 tablet 1  . Magnesium Gluconate 250 MG TABS Take 500 mg by mouth 2 (two) times daily.    . metoprolol succinate (TOPROL-XL) 25 MG 24 hr tablet Take 3 tablets (75 mg total) by mouth 2 (two) times daily. 180 tablet 1  . mexiletine (MEXITIL) 150 MG capsule Take 1 capsule (150 mg total) by mouth 3 (three) times daily. 270 capsule 3  . ofloxacin (FLOXIN) 0.3 % otic solution Place 5 drops into the right ear 2 (two) times daily. 5 mL 0  . Olopatadine HCl (PATANASE) 0.6 % SOLN Place 1 puff into the nose daily as needed (for allergies).     Marland Kitchen omeprazole (PRILOSEC) 40 MG capsule Take 1 capsule (40 mg total) by mouth daily as needed. 90 capsule 3  . omeprazole (PRILOSEC) 40 MG capsule TAKE ONE CAPSULE BY MOUTH ONCE DAILY AS NEEDED 30 capsule 6  . ondansetron (ZOFRAN) 4 MG tablet Take 4 mg by mouth 2 (two) times daily as needed. EVERY 12 HOURS AS NEEDED BY MOUTH FOR NAUSEA  3  . oxyCODONE-acetaminophen (PERCOCET) 7.5-325 MG per tablet Take 1 tablet by mouth every 4 (four) hours as needed (pain).   0  . Potassium Gluconate 595 MG CAPS  Take 1 capsule (595 mg total) by mouth daily. OK to take extra on days you need extra lasix 100 capsule 3  . ranolazine (RANEXA) 1000 MG SR tablet Take 1 tablet (1,000 mg total) by mouth 2 (two) times daily. 180 tablet 3  . sertraline (ZOLOFT) 100 MG tablet Take 1 tablet (100 mg total) by mouth daily. 90 tablet 1   No current facility-administered medications for this visit.    Allergies:   Phenergan; Warfarin and related; and Nitroglycerin   Social History   Social History  . Marital Status: Single    Spouse Name: N/A  . Number of Children: 1  . Years of Education: N/A   Occupational History  . Naval architect    Social History Main Topics  . Smoking status: Former Smoker -- 1.00 packs/day for 3 years    Types: Cigarettes    Quit date: 09/30/1988  . Smokeless tobacco: Never Used     Comment: quitin 1990  . Alcohol Use: No     Comment: none since "months" as of 02/28/2013  . Drug Use: No     Comment: quiti n 1989  . Sexual Activity: Yes    Birth Control/ Protection: Pill   Other Topics Concern  . None   Social History Narrative   Caffeine: 1 cup coffee/day   Divorced, 1 child.  lives with boyfriend, dogs   Occupation: Full time Tax adviser   Activity: walks 19min/day   Diet: fruits/vegetables daily, water, no fish     Family History:  The patient's family history includes Anemia in her mother; Cancer in her paternal grandfather; Diabetes in her father and paternal grandfather; Heart disease in her father and maternal grandfather; Hyperlipidemia in her maternal grandfather; Stroke in her father; Thyroid disease in her mother. There is no history of Coronary artery disease, Sudden death, or Heart attack.   ROS:   Please see the history of present illness.  Review of Systems  Constitution: Positive for malaise/fatigue and weight gain.  All other systems reviewed and are negative.   PHYSICAL EXAM:   VS:  BP 98/50 mmHg  Pulse 74  Ht 5' (1.524 m)   Wt 253 lb (114.76 kg)  BMI 49.41 kg/m2   GEN: Well nourished, well developed, in no acute distress HEENT: normal Neck: no JVD, no masses Cardiac: Normal S1/S2, RRR; I cannot appreciate any murmurs today, no rubs, or gallops, trace bilateral LE edema;     Respiratory:  clear to auscultation bilaterally; no wheezing, rhonchi or rales GI: soft, nontender  MS: no deformity or atrophy Skin: warm and dry, no rash Neuro:   no focal deficits  Psych: Alert and oriented x 3, normal affect  Wt Readings from Last 3 Encounters:  10/05/15 253 lb (114.76 kg)  09/17/15 250 lb (113.399 kg)  08/07/15 251 lb 4 oz (113.966 kg)      Studies/Labs Reviewed:   EKG:  EKG is ordered today.  The ekg ordered today demonstrates atrial paced, HR 75, LBBB, no change from prior tracing  Recent Labs: 12/26/2014: Magnesium 1.8 03/06/2015: Pro B Natriuretic peptide (BNP) 215.0* 07/01/2015: B Natriuretic Peptide 191.8*; BUN 21*; Creatinine, Ser 1.04*; Hemoglobin 12.7; Platelets 251; Potassium 3.9; Sodium 134* 07/17/2015: ALT 62* 09/15/2015: TSH 10.577*   Recent Lipid Panel    Component Value Date/Time   CHOL 218* 07/31/2015 1438   TRIG 230.0* 07/31/2015 1438   HDL 52.50 07/31/2015 1438   CHOLHDL 4 07/31/2015 1438   VLDL 46.0* 07/31/2015 1438   LDLCALC 113* 08/16/2013 0823   LDLDIRECT 142.0 07/31/2015 1438    Additional studies/ records that were reviewed today include:   Echo 11/24/14 Moderate LVH, EF 55-60%, normal wall motion, grade 2 diastolic dysfunction, mild AI, moderate LAE  Per records patient is s/p MV repair but there appears to be some thickening and calcification of the MVleaflets with diastolic doming of the anterior leaflet. The mean gradient across the valve is 8-48mmHg consistent with moderateMitral stenosis but the MVA and PHT are consistent with mild mitral stenosis. Calcified annulus. Moderate diffuse thickening of the anterior leaflet. Mild focal calcification of the anteriorleaflet  (medial segment(s)), with mild involvement of chords, consistent with rheumatic disease. Mobility of the anterior leaflet was mildly restricted. Diastolic leaflet doming was present. The findings are consistent with mild to moderate stenosis. There was trivial regurgitation.  Echocardiogram (11/2013):  HOCM with prior septal myomectomy, moderate LVH, EF 40-45%, trivial AI, mitral valve s/p repair with a small diastolic gradient, no MS (mean 9 mmHg, peak 16 mmHg), trivial MR, moderate LAE   ASSESSMENT:    1. Persistent atrial fibrillation (HCC)   2. VT (ventricular tachycardia) (HCC)   3. HOCM (hypertrophic obstructive cardiomyopathy) (HCC)   4. S/P mitral valve repair   5. Chronic combined systolic and diastolic CHF (congestive heart failure) (HCC)   6. Hypothyroidism due to medication   7. Chronic pain     PLAN:  In order of problems listed above:  1.Atrial Arrhythmias: Maintaining sinus rhythm. She remains on Amiodarone and Eliquis. She is tolerating the Amiodarone thus far. She is taking Zofran to control nausea. TSH has been elevated and she is now on Synthroid. Recent ALT elevated. [place] confirmedfindings consistent with fatty liver. FU with Dr. Lewayne Bunting as planned.   -  Repeat LFTs, TSH, FT4, FT3.  2. Ventricular tachycardia/Ventricular Fibrillation:  S/p AICD.  She remains on mexiletine, Amiodarone and Ranexa. FU with EP as planned.  3.Hypertrophic Obstructive Cardiomyopathy, s/p Septal Myomectomy: Echo in 10/2014 fairly stable.  Plan repeat Echo in several mos.    4.S/p Mitral Valve Repair: As noted, Echo in Feb with fairly stable MV gradients. Continue SBE prophylaxis. Plan repeat Echo in several mos.  5.Chronic Combined Systolic and Diastolic CHF: Volume fairly stable.  She remains on Lasix.    -  Check FU BMET today.   6. Hypothyroidism: Continue Synthroid. Follow-up TSH in 12/16 was 10.577.    -  Check FT4, FT3, TSH today  7. Back Pain - She  has been on Percocet chronically for pain management and is followed at the [facility].  She was asked recently by her provider if it was ok for her to continue this drug from a cardiac perspective.  From a cardiac perspective, she may continue on Percocet as needed.    Medication Adjustments/Labs and Tests Ordered: Current medicines are reviewed at length with the patient today.  Concerns regarding medicines are outlined above.  Medication changes, Labs and Tests ordered today are listed below. Patient Instructions  Medication Instructions:  Your physician recommends that you continue on your current medications as directed. Please refer to the Current Medication list given to you today.   Labwork: TODAY BMET, TSH, LFT, FREE T3, FREE T4  Testing/Procedures: Your physician has requested that you have an echocardiogram. YOU WILL NEED ECHO TO BE DONE ABOUT 1 WEEK BEFORE APPT WITH DR. Ladona Ridgel. Echocardiography is a painless test that uses sound waves to create images of your heart. It provides your doctor with information about the size and shape of your heart and how well your heart's chambers and valves are working. This procedure takes approximately one hour. There are no restrictions for this procedure.   Follow-Up: 1. DR. Excell Seltzer IN 4 MONTHS  2. DR. Ladona Ridgel NEXT AVAILABLE  Any Other Special Instructions Will Be Listed Below (If Applicable).    If you need a refill on your cardiac medications before your next appointment, please call your pharmacy.        Signed, Tereso Newcomer, PA-C  10/05/2015 9:03 AM    [facility] [address] [place], [place], [place]  [address] Phone: [phone]; Fax: [phone]

## 2015-10-05 ENCOUNTER — Encounter: Payer: Self-pay | Admitting: Physician Assistant

## 2015-10-05 ENCOUNTER — Ambulatory Visit (INDEPENDENT_AMBULATORY_CARE_PROVIDER_SITE_OTHER): Payer: Medicare Other | Admitting: Physician Assistant

## 2015-10-05 ENCOUNTER — Telehealth: Payer: Self-pay | Admitting: *Deleted

## 2015-10-05 VITALS — BP 98/50 | HR 74 | Ht 60.0 in | Wt 253.0 lb

## 2015-10-05 DIAGNOSIS — Z95810 Presence of automatic (implantable) cardiac defibrillator: Secondary | ICD-10-CM | POA: Diagnosis not present

## 2015-10-05 DIAGNOSIS — I5042 Chronic combined systolic (congestive) and diastolic (congestive) heart failure: Secondary | ICD-10-CM

## 2015-10-05 DIAGNOSIS — G8929 Other chronic pain: Secondary | ICD-10-CM

## 2015-10-05 DIAGNOSIS — I421 Obstructive hypertrophic cardiomyopathy: Secondary | ICD-10-CM

## 2015-10-05 DIAGNOSIS — E039 Hypothyroidism, unspecified: Secondary | ICD-10-CM

## 2015-10-05 DIAGNOSIS — E032 Hypothyroidism due to medicaments and other exogenous substances: Secondary | ICD-10-CM

## 2015-10-05 DIAGNOSIS — I481 Persistent atrial fibrillation: Secondary | ICD-10-CM

## 2015-10-05 DIAGNOSIS — I472 Ventricular tachycardia, unspecified: Secondary | ICD-10-CM

## 2015-10-05 DIAGNOSIS — Z9889 Other specified postprocedural states: Secondary | ICD-10-CM | POA: Diagnosis not present

## 2015-10-05 DIAGNOSIS — I4819 Other persistent atrial fibrillation: Secondary | ICD-10-CM

## 2015-10-05 DIAGNOSIS — E0590 Thyrotoxicosis, unspecified without thyrotoxic crisis or storm: Secondary | ICD-10-CM

## 2015-10-05 LAB — HEPATIC FUNCTION PANEL
ALT: 53 U/L — ABNORMAL HIGH (ref 6–29)
AST: 35 U/L — ABNORMAL HIGH (ref 10–30)
Albumin: 4.3 g/dL (ref 3.6–5.1)
Alkaline Phosphatase: 50 U/L (ref 33–115)
BILIRUBIN INDIRECT: 0.5 mg/dL (ref 0.2–1.2)
BILIRUBIN TOTAL: 0.6 mg/dL (ref 0.2–1.2)
Bilirubin, Direct: 0.1 mg/dL (ref <=0.2)
TOTAL PROTEIN: 7.5 g/dL (ref 6.1–8.1)

## 2015-10-05 LAB — T3, FREE: T3, Free: 2.1 pg/mL — ABNORMAL LOW (ref 2.3–4.2)

## 2015-10-05 LAB — TSH: TSH: 8.922 u[IU]/mL — AB (ref 0.350–4.500)

## 2015-10-05 LAB — BASIC METABOLIC PANEL
BUN: 15 mg/dL (ref 7–25)
CALCIUM: 9.2 mg/dL (ref 8.6–10.2)
CO2: 28 mmol/L (ref 20–31)
Chloride: 103 mmol/L (ref 98–110)
Creat: 0.93 mg/dL (ref 0.50–1.10)
GLUCOSE: 112 mg/dL — AB (ref 65–99)
Potassium: 3.8 mmol/L (ref 3.5–5.3)
SODIUM: 139 mmol/L (ref 135–146)

## 2015-10-05 LAB — T4, FREE: FREE T4: 1.31 ng/dL (ref 0.80–1.80)

## 2015-10-05 MED ORDER — LEVOTHYROXINE SODIUM 150 MCG PO TABS: 150.0000 ug | ORAL_TABLET | Freq: Every day | ORAL | Status: AC

## 2015-10-05 NOTE — Telephone Encounter (Signed)
Pt notified of lab work and by phone with verbal understanding. Pt advised to increase Synthroid to 150 mcg daily; Rx sent in. Pt also being referred to Endocrinology, pt states would like to see Dr. Chestine Sporelark? [facility].

## 2015-10-05 NOTE — Patient Instructions (Addendum)
Medication Instructions:  Your physician recommends that you continue on your current medications as directed. Please refer to the Current Medication list given to you today.   Labwork: TODAY BMET, TSH, LFT, FREE T3, FREE T4  Testing/Procedures: Your physician has requested that you have an echocardiogram. YOU WILL NEED ECHO TO BE DONE ABOUT 1 WEEK BEFORE APPT WITH DR. Ladona RidgelAYLOR. Echocardiography is a painless test that uses sound waves to create images of your heart. It provides your doctor with information about the size and shape of your heart and how well your heart's chambers and valves are working. This procedure takes approximately one hour. There are no restrictions for this procedure.   Follow-Up: 1. DR. Excell SeltzerOOPER IN 4 MONTHS  2. DR. Ladona RidgelAYLOR NEXT AVAILABLE  Any Other Special Instructions Will Be Listed Below (If Applicable).    If you need a refill on your cardiac medications before your next appointment, please call your pharmacy.

## 2015-10-10 ENCOUNTER — Encounter (HOSPITAL_COMMUNITY): Payer: Self-pay | Admitting: Emergency Medicine

## 2015-10-10 ENCOUNTER — Emergency Department (HOSPITAL_COMMUNITY)
Admission: EM | Admit: 2015-10-10 | Discharge: 2015-10-10 | Disposition: A | Discharge status: Home / Self Care | Payer: Medicare Other | Attending: Emergency Medicine | Admitting: Emergency Medicine

## 2015-10-10 ENCOUNTER — Emergency Department (HOSPITAL_COMMUNITY): Payer: Medicare Other

## 2015-10-10 DIAGNOSIS — F4310 Post-traumatic stress disorder, unspecified: Secondary | ICD-10-CM | POA: Diagnosis not present

## 2015-10-10 DIAGNOSIS — Z86718 Personal history of other venous thrombosis and embolism: Secondary | ICD-10-CM | POA: Diagnosis not present

## 2015-10-10 DIAGNOSIS — J069 Acute upper respiratory infection, unspecified: Secondary | ICD-10-CM | POA: Diagnosis not present

## 2015-10-10 DIAGNOSIS — F329 Major depressive disorder, single episode, unspecified: Secondary | ICD-10-CM | POA: Insufficient documentation

## 2015-10-10 DIAGNOSIS — R0981 Nasal congestion: Secondary | ICD-10-CM | POA: Diagnosis present

## 2015-10-10 DIAGNOSIS — G43909 Migraine, unspecified, not intractable, without status migrainosus: Secondary | ICD-10-CM | POA: Insufficient documentation

## 2015-10-10 DIAGNOSIS — Z87891 Personal history of nicotine dependence: Secondary | ICD-10-CM | POA: Diagnosis not present

## 2015-10-10 DIAGNOSIS — G8929 Other chronic pain: Secondary | ICD-10-CM | POA: Insufficient documentation

## 2015-10-10 DIAGNOSIS — I4891 Unspecified atrial fibrillation: Secondary | ICD-10-CM | POA: Diagnosis not present

## 2015-10-10 DIAGNOSIS — E039 Hypothyroidism, unspecified: Secondary | ICD-10-CM | POA: Diagnosis not present

## 2015-10-10 DIAGNOSIS — I5042 Chronic combined systolic (congestive) and diastolic (congestive) heart failure: Secondary | ICD-10-CM | POA: Insufficient documentation

## 2015-10-10 DIAGNOSIS — Z8619 Personal history of other infectious and parasitic diseases: Secondary | ICD-10-CM | POA: Insufficient documentation

## 2015-10-10 DIAGNOSIS — Z79899 Other long term (current) drug therapy: Secondary | ICD-10-CM | POA: Diagnosis not present

## 2015-10-10 DIAGNOSIS — Z8719 Personal history of other diseases of the digestive system: Secondary | ICD-10-CM | POA: Insufficient documentation

## 2015-10-10 DIAGNOSIS — F419 Anxiety disorder, unspecified: Secondary | ICD-10-CM | POA: Diagnosis not present

## 2015-10-10 DIAGNOSIS — I4892 Unspecified atrial flutter: Secondary | ICD-10-CM | POA: Insufficient documentation

## 2015-10-10 DIAGNOSIS — Z7951 Long term (current) use of inhaled steroids: Secondary | ICD-10-CM | POA: Insufficient documentation

## 2015-10-10 DIAGNOSIS — Z9889 Other specified postprocedural states: Secondary | ICD-10-CM | POA: Insufficient documentation

## 2015-10-10 DIAGNOSIS — Z792 Long term (current) use of antibiotics: Secondary | ICD-10-CM | POA: Diagnosis not present

## 2015-10-10 DIAGNOSIS — Z8739 Personal history of other diseases of the musculoskeletal system and connective tissue: Secondary | ICD-10-CM | POA: Diagnosis not present

## 2015-10-10 DIAGNOSIS — R05 Cough: Secondary | ICD-10-CM | POA: Diagnosis not present

## 2015-10-10 DIAGNOSIS — R0602 Shortness of breath: Secondary | ICD-10-CM | POA: Diagnosis not present

## 2015-10-10 MED ORDER — BENZONATATE 100 MG PO CAPS: 200.0000 mg | ORAL_CAPSULE | Freq: Two times a day (BID) | ORAL | Status: AC | PRN

## 2015-10-10 NOTE — ED Provider Notes (Signed)
CSN: [id number]     Arrival date & time 10/10/15  4098 History   First MD Initiated Contact with Patient 10/10/15 [phone]     Chief Complaint  Patient presents with  . Nasal Congestion  . Cough  . Sore Throat     (Consider location/radiation/quality/duration/timing/severity/associated sxs/prior Treatment) HPI Comments: Patient presents to the emergency department with chief complaint of cold symptoms. She reports nasal congestion, cough, sore throat, and some chest pain when she is coughing. She denies any chest pain at rest. Denies difficulty breathing. She does report having a history of asthma. She states that her son is sick with the same symptoms. She has tried taking OTC medications with no relief. She denies any fevers or chills. There are no aggravating or alleviating factors.  The history is provided by the patient. No language interpreter was used.    Past Medical History  Diagnosis Date  . Hypertrophic cardiomyopathy (HCC)     s/p septal myomectomy with implantable defibrillator 01/27/2012   . Anxiety state, unspecified   . Allergic rhinitis     to dogs  . Asthma   . Anemia     during pregnancy  . History of chicken pox   . Generalized headaches   . Ocular migraine     no HA  . Back pain     told cracked bone, vicodin didn't help, improved with percocets/muscle relaxants, no eval by prior PCP  . 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 DVT (deep venous thrombosis) (HCC) 01/2012    was placed on Warfarin after cardiac surgery but had skin reaction to warfarin so was changed to Pradaxa so DVT occurred while on Pradaxa although unclear if it occurred during transition  . Hypotension   . Atrial fibrillation (HCC)     a. On pradaxa.  coumadin necrosis with warfarin;  b. Amio d/c'd 02/2012  . Pericardial effusion   . Chronic combined systolic and diastolic CHF (congestive heart failure) (HCC)     a. echo 03/19/12: severe  asymmetric septal hypertrophy, evidence of upper septal myomectomy, peak LV outflow tract gradient 35, EF 35%,;  b.  Echo 9/13: Severe LVH, EF 30-35%, anteroseptal and inferior HK, LVOT narrow, mild AI, mild to moderate mitral stenosis, severe LAE  . Pericardial tamponade 03/14/2012  . 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 01/2012 s/p BSX ICD;  b. 06/2012 Multaq initiated due to recurrent syncope, VT/VF - had severe n/v with IV amio.  . Skin necrosis --COUMADIN   . Chronic chest wall pain     eval by CVTS, stable, rec return to [facility] if continued pain  . Syncope     a. in setting of VT/VF.  Marland Kitchen Hypothyroid   . Blood transfusion without reported diagnosis   . Neuromuscular disorder (HCC)   . Gall stones   . Atrial flutter (HCC)     a. 10/2013 with inappropriate ICD therapy; b. s/p CTI ablation by Dr Ladona Ridgel 11/16/2013;  c. 11/2013 syncope 2/2 aflutter/VT->failed DCCV->amio resumed w/ zofran for nausea.  Marland Kitchen PONV (postoperative nausea and vomiting)   . Depression   . Post-traumatic stress   . Fibromyalgia   . Hx of echocardiogram     Echo (2/16): mod LVH, EF 55-60%, no RWMA, Gr 2 DD, mild AI, mild to mod MS, mod LAE  . Fatty liver disease, nonalcoholic 2016    by abd [place]   Past Surgical History  Procedure Laterality Date  . Cesarean section  1991  . Induced abortion  1990 and 1993  . Cardiac surgery  01/27/2012    median sternotomy, septal myectomy, MVR - [facility] (Dr. Silvestre MesiGlower)  . Cardiac defibrillator placement  01/2012    BSX dual chamber ICD  . Myomectomy    . Pericardial window  03/14/2012    Procedure: PERICARDIAL WINDOW;  Surgeon: Purcell Nailslarence H Owen, MD;  [facility];  Service: Open Heart Surgery;  Laterality: N/A;  Subxyphoid Pericardial  Window   . Ablation  11-16-13    RFCA of atrial flutter by Dr Ladona Ridgelaylor   . Insert / replace / remove pacemaker    . Mitral valve repair    . Cholecystectomy N/A 01/26/2014    LAPAROSCOPIC CHOLECYSTECTOMY WITH INTRAOPERATIVE CHOLANGIOGRAM;  Surgeon:  Wilmon ArmsMatthew K. Tsuei, MD  . Atrial flutter ablation N/A 11/16/2013    Procedure: ATRIAL FLUTTER ABLATION;  Surgeon: Marinus MawGregg W Taylor, MD;  [facility];  Service: Cardiovascular;  Laterality: N/A;  . Cardioversion N/A 12/13/2014    Procedure: CARDIOVERSION;  Surgeon: Wendall StadePeter C Nishan, MD;  [facility];  Service: Cardiovascular;  Laterality: N/A;   Family History  Problem Relation Age of Onset  . Diabetes Father   . Heart disease Father     unspecified heart problem  . Thyroid disease Mother   . Anemia Mother     mediterranean anemia, ITP  . Heart disease Maternal Grandfather     CHF  . Hyperlipidemia Maternal Grandfather   . Diabetes Paternal Grandfather   . Cancer Paternal Grandfather   . Coronary artery disease Neg Hx   . Stroke Father   . Sudden death Neg Hx   . Heart attack Neg Hx    Social History  Substance Use Topics  . Smoking status: Former Smoker -- 1.00 packs/day for 3 years    Types: Cigarettes    Quit date: 09/30/1988  . Smokeless tobacco: Never Used     Comment: quitin 1990  . Alcohol Use: No     Comment: none since "months" as of 02/28/2013   OB History    No data available     Review of Systems  Constitutional: Positive for chills. Negative for fever.  HENT: Positive for postnasal drip, rhinorrhea, sinus pressure, sneezing and sore throat.   Respiratory: Positive for cough. Negative for shortness of breath.   Cardiovascular: Negative for chest pain.  Gastrointestinal: Negative for nausea, vomiting, abdominal pain, diarrhea and constipation.  Genitourinary: Negative for dysuria.  All other systems reviewed and are negative.     Allergies  Phenergan; Warfarin and related; and Nitroglycerin  Home Medications   Prior to Admission medications   Medication Sig Start Date End Date Taking? Authorizing Provider  acetaminophen (TYLENOL) 500 MG tablet Take 500-1,000 mg by mouth 2 (two) times daily as needed for mild pain, moderate pain, fever or  headache (pain).     Historical Provider, MD  albuterol (PROVENTIL HFA;VENTOLIN HFA) 108 (90 BASE) MCG/ACT inhaler 2 puffs TID daily PRN (Fill whichever her insurance will cover-albuterol, Proair, Proventil, Ventolin) 07/13/15   Eustaquio BoydenJavier Gutierrez, MD  ALPRAZolam Prudy Feeler(XANAX) 1 MG tablet TAKE ONE-HALF TO ONE TABLET BY MOUTH TWICE DAILY AS NEEDED 08/15/15   Eustaquio BoydenJavier Gutierrez, MD  amiodarone (PACERONE) 200 MG tablet 400mg  daily (two 200mg  tablets) on Monday, Tuesday, Wednesday and Thursday and Amiodarone 200mg  daily on Friday, Saturday and Sunday 08/21/15   Marinus MawGregg W Taylor, MD  amoxicillin (AMOXIL) 500 MG capsule Take 500  mg by mouth 2 (two) times daily. DENTAL PROCEDURES ONLY 02/28/15   Historical Provider, MD  beclomethasone (QVAR) 80 MCG/ACT inhaler Inhale 1 puff into the lungs 2 (two) times daily.    Historical Provider, MD  benzonatate (TESSALON) 100 MG capsule Take 2 capsules (200 mg total) by mouth 2 (two) times daily as needed for cough. 10/10/15   Roxy Horseman, PA-C  cetirizine (ZYRTEC) 10 MG tablet Take 10 mg by mouth daily.     Historical Provider, MD  docusate sodium (COLACE) 100 MG capsule Take 100 mg by mouth 2 (two) times daily as needed for mild constipation.    Historical Provider, MD  ELIQUIS 5 MG TABS tablet TAKE ONE TABLET BY MOUTH TWICE DAILY 09/11/15   Tonny Bollman, MD  furosemide (LASIX) 40 MG tablet Pt may take 1 extra tablet daily as needed for swelling (Takes an extra potassium when she does this) 08/21/15   Marinus Maw, MD  gabapentin (NEURONTIN) 300 MG capsule Take 2 capsules (600 mg total) by mouth 3 (three) times daily. 07/14/15   Drema Dallas, DO  levonorgestrel (MIRENA) 20 MCG/24HR IUD 1 each by Intrauterine route once.    Historical Provider, MD  levothyroxine (SYNTHROID, LEVOTHROID) 150 MCG tablet Take 1 tablet (150 mcg total) by mouth daily before breakfast. 10/05/15   Beatrice Lecher, PA-C  Magnesium Gluconate 250 MG TABS Take 500 mg by mouth 2 (two) times daily.     Historical Provider, MD  metoprolol succinate (TOPROL-XL) 25 MG 24 hr tablet Take 3 tablets (75 mg total) by mouth 2 (two) times daily. 08/21/15   Tonny Bollman, MD  mexiletine (MEXITIL) 150 MG capsule Take 1 capsule (150 mg total) by mouth 3 (three) times daily. 06/14/15   Marinus Maw, MD  ofloxacin (FLOXIN) 0.3 % otic solution Place 5 drops into the right ear 2 (two) times daily. 09/17/15   Eyvonne Mechanic, PA-C  Olopatadine HCl (PATANASE) 0.6 % SOLN Place 1 puff into the nose daily as needed (for allergies).     Historical Provider, MD  omeprazole (PRILOSEC) 40 MG capsule Take 1 capsule (40 mg total) by mouth daily as needed. 08/21/15   Marinus Maw, MD  omeprazole (PRILOSEC) 40 MG capsule TAKE ONE CAPSULE BY MOUTH ONCE DAILY AS NEEDED 09/18/15   Eustaquio Boyden, MD  ondansetron (ZOFRAN) 4 MG tablet Take 4 mg by mouth 2 (two) times daily as needed. EVERY 12 HOURS AS NEEDED BY MOUTH FOR NAUSEA 07/05/15   Historical Provider, MD  oxyCODONE-acetaminophen (PERCOCET) 7.5-325 MG per tablet Take 1 tablet by mouth every 4 (four) hours as needed (pain).  10/13/14   Historical Provider, MD  Potassium Gluconate 595 MG CAPS Take 1 capsule (595 mg total) by mouth daily. OK to take extra on days you need extra lasix 11/03/14   Beatrice Lecher, PA-C  ranolazine (RANEXA) 1000 MG SR tablet Take 1 tablet (1,000 mg total) by mouth 2 (two) times daily. 04/17/15 04/16/16  Tonny Bollman, MD  sertraline (ZOLOFT) 100 MG tablet Take 1 tablet (100 mg total) by mouth daily. 08/21/15   Eustaquio Boyden, MD   BP 95/52 mmHg  Pulse 74  Temp(Src) 97.5 F (36.4 C) (Oral)  Resp 16  SpO2 97% Physical Exam  Constitutional: She appears well-developed and well-nourished. No distress.  HENT:  Head: Normocephalic.  Right Ear: External ear normal.  Left Ear: External ear normal.  Mildly erythematous, no tonsillar exudate, no abscess, no stridor, uvula is midline  TMs clear  bilaterally  Eyes: Conjunctivae and EOM are normal.  Pupils are equal, round, and reactive to light.  Neck: Normal range of motion. Neck supple.  Cardiovascular: Normal rate, regular rhythm and normal heart sounds.  Exam reveals no gallop and no friction rub.   No murmur heard. Pulmonary/Chest: Effort normal and breath sounds normal. No stridor. No respiratory distress. She has no wheezes. She has no rales. She exhibits no tenderness.  CTAB  Abdominal: Soft. Bowel sounds are normal. She exhibits no distension. There is no tenderness.  Musculoskeletal: Normal range of motion. She exhibits no tenderness.  Neurological: She is alert.  Skin: Skin is warm and dry. No rash noted. She is not diaphoretic.  Psychiatric: She has a normal mood and affect. Her behavior is normal. Judgment and thought content normal.  Nursing note and vitals reviewed.   ED Course  Procedures (including critical care time)  Dg Chest 2 View  10/10/2015  CLINICAL DATA:  Cough, congestion and shortness of breath for 3 days. EXAM: CHEST  2 VIEW COMPARISON:  07/01/2015 FINDINGS: The heart is mildly enlarged but stable. The mediastinal and hilar contours are within normal limits and unchanged. The pacer wires/ AICD are stable. Stable lead in the azygos vein. Mild vascular congestion without pulmonary edema. No infiltrates or effusions. The bony thorax is intact. IMPRESSION: Stable mild cardiac enlargement and surgical changes. There is mild vascular congestion but no overt pulmonary edema or focal infiltrates. Electronically Signed   By: Rudie Meyer M.D.   On: 10/10/2015 10:39   I have personally reviewed and evaluated these images and lab results as part of my medical decision-making.    MDM   Final diagnoses:  URI (upper respiratory infection)    Pt CXR negative for acute infiltrate. Patients symptoms are consistent with URI, likely viral etiology. Discussed that antibiotics are not indicated for viral infections. Pt will be discharged with symptomatic treatment.   Verbalizes understanding and is agreeable with plan. Pt is hemodynamically stable & in NAD prior to dc.     Roxy Horseman, PA-C 10/10/15 1059  Doug Sou, MD 10/10/15 [phone]

## 2015-10-10 NOTE — ED Notes (Signed)
Nasal congestion, cough, sore throat, no fever x 3 days. OTC medications not working, step son sick with same symptoms.

## 2015-10-10 NOTE — Discharge Instructions (Signed)
Upper Respiratory Infection, Adult Most upper respiratory infections (URIs) are a viral infection of the air passages leading to the lungs. A URI affects the nose, throat, and upper air passages. The most common type of URI is nasopharyngitis and is typically referred to as "the common cold." URIs run their course and usually go away on their own. Most of the time, a URI does not require medical attention, but sometimes a bacterial infection in the upper airways can follow a viral infection. This is called a secondary infection. Sinus and middle ear infections are common types of secondary upper respiratory infections. Bacterial pneumonia can also complicate a URI. A URI can worsen asthma and chronic obstructive pulmonary disease (COPD). Sometimes, these complications can require emergency medical care and may be life threatening.  CAUSES Almost all URIs are caused by viruses. A virus is a type of germ and can spread from one person to another.  RISKS FACTORS You may be at risk for a URI if:   You smoke.   You have chronic heart or lung disease.  You have a weakened defense (immune) system.   You are very young or very old.   You have nasal allergies or asthma.  You work in crowded or poorly ventilated areas.  You work in health care facilities or schools. SIGNS AND SYMPTOMS  Symptoms typically develop 2-3 days after you come in contact with a cold virus. Most viral URIs last 7-10 days. However, viral URIs from the influenza virus (flu virus) can last 14-18 days and are typically more severe. Symptoms may include:   Runny or stuffy (congested) nose.   Sneezing.   Cough.   Sore throat.   Headache.   Fatigue.   Fever.   Loss of appetite.   Pain in your forehead, behind your eyes, and over your cheekbones (sinus pain).  Muscle aches.  DIAGNOSIS  Your health care provider may diagnose a URI by:  Physical exam.  Tests to check that your symptoms are not due to  another condition such as:  Strep throat.  Sinusitis.  Pneumonia.  Asthma. TREATMENT  A URI goes away on its own with time. It cannot be cured with medicines, but medicines may be prescribed or recommended to relieve symptoms. Medicines may help:  Reduce your fever.  Reduce your cough.  Relieve nasal congestion. HOME CARE INSTRUCTIONS   Take medicines only as directed by your health care provider.   Gargle warm saltwater or take cough drops to comfort your throat as directed by your health care provider.  Use a warm mist humidifier or inhale steam from a shower to increase air moisture. This may make it easier to breathe.  Drink enough fluid to keep your urine clear or pale yellow.   Eat soups and other clear broths and maintain good nutrition.   Rest as needed.   Return to work when your temperature has returned to normal or as your health care provider advises. You may need to stay home longer to avoid infecting others. You can also use a face mask and careful hand washing to prevent spread of the virus.  Increase the usage of your inhaler if you have asthma.   Do not use any tobacco products, including cigarettes, chewing tobacco, or electronic cigarettes. If you need help quitting, ask your health care provider. PREVENTION  The best way to protect yourself from getting a cold is to practice good hygiene.   Avoid oral or hand contact with people with cold   symptoms.   Wash your hands often if contact occurs.  There is no clear evidence that vitamin C, vitamin E, echinacea, or exercise reduces the chance of developing a cold. However, it is always recommended to get plenty of rest, exercise, and practice good nutrition.  SEEK MEDICAL CARE IF:   You are getting worse rather than better.   Your symptoms are not controlled by medicine.   You have chills.  You have worsening shortness of breath.  You have brown or red mucus.  You have yellow or brown nasal  discharge.  You have pain in your face, especially when you bend forward.  You have a fever.  You have swollen neck glands.  You have pain while swallowing.  You have white areas in the back of your throat. SEEK IMMEDIATE MEDICAL CARE IF:   You have severe or persistent:  Headache.  Ear pain.  Sinus pain.  Chest pain.  You have chronic lung disease and any of the following:  Wheezing.  Prolonged cough.  Coughing up blood.  A change in your usual mucus.  You have a stiff neck.  You have changes in your:  Vision.  Hearing.  Thinking.  Mood. MAKE SURE YOU:   Understand these instructions.  Will watch your condition.  Will get help right away if you are not doing well or get worse.   This information is not intended to replace advice given to you by your health care provider. Make sure you discuss any questions you have with your health care provider.   Document Released: 03/12/2001 Document Revised: 01/31/2015 Document Reviewed: 12/22/2013 [employer] Interactive Patient Education 2016 [employer]  

## 2015-10-13 ENCOUNTER — Ambulatory Visit: Payer: Medicare Other | Admitting: Family Medicine

## 2015-10-13 ENCOUNTER — Encounter (HOSPITAL_COMMUNITY): Payer: Self-pay | Admitting: Emergency Medicine

## 2015-10-13 ENCOUNTER — Emergency Department (HOSPITAL_COMMUNITY)
Admission: EM | Admit: 2015-10-13 | Discharge: 2015-11-01 | Disposition: E | Payer: Medicare Other | Attending: Emergency Medicine | Admitting: Emergency Medicine

## 2015-10-13 ENCOUNTER — Telehealth: Payer: Self-pay

## 2015-10-13 DIAGNOSIS — Z8619 Personal history of other infectious and parasitic diseases: Secondary | ICD-10-CM | POA: Diagnosis not present

## 2015-10-13 DIAGNOSIS — Z87891 Personal history of nicotine dependence: Secondary | ICD-10-CM | POA: Insufficient documentation

## 2015-10-13 DIAGNOSIS — Z7901 Long term (current) use of anticoagulants: Secondary | ICD-10-CM | POA: Diagnosis not present

## 2015-10-13 DIAGNOSIS — Z79899 Other long term (current) drug therapy: Secondary | ICD-10-CM | POA: Insufficient documentation

## 2015-10-13 DIAGNOSIS — Z95810 Presence of automatic (implantable) cardiac defibrillator: Secondary | ICD-10-CM | POA: Insufficient documentation

## 2015-10-13 DIAGNOSIS — G8929 Other chronic pain: Secondary | ICD-10-CM | POA: Diagnosis not present

## 2015-10-13 DIAGNOSIS — R230 Cyanosis: Secondary | ICD-10-CM | POA: Diagnosis not present

## 2015-10-13 DIAGNOSIS — Z86718 Personal history of other venous thrombosis and embolism: Secondary | ICD-10-CM | POA: Insufficient documentation

## 2015-10-13 DIAGNOSIS — F329 Major depressive disorder, single episode, unspecified: Secondary | ICD-10-CM | POA: Insufficient documentation

## 2015-10-13 DIAGNOSIS — M797 Fibromyalgia: Secondary | ICD-10-CM | POA: Insufficient documentation

## 2015-10-13 DIAGNOSIS — G43909 Migraine, unspecified, not intractable, without status migrainosus: Secondary | ICD-10-CM | POA: Insufficient documentation

## 2015-10-13 DIAGNOSIS — Z7951 Long term (current) use of inhaled steroids: Secondary | ICD-10-CM | POA: Insufficient documentation

## 2015-10-13 DIAGNOSIS — R0602 Shortness of breath: Secondary | ICD-10-CM | POA: Diagnosis not present

## 2015-10-13 DIAGNOSIS — J45909 Unspecified asthma, uncomplicated: Secondary | ICD-10-CM | POA: Diagnosis not present

## 2015-10-13 DIAGNOSIS — Z792 Long term (current) use of antibiotics: Secondary | ICD-10-CM | POA: Insufficient documentation

## 2015-10-13 DIAGNOSIS — I469 Cardiac arrest, cause unspecified: Secondary | ICD-10-CM | POA: Diagnosis not present

## 2015-10-13 DIAGNOSIS — F419 Anxiety disorder, unspecified: Secondary | ICD-10-CM | POA: Diagnosis not present

## 2015-10-13 DIAGNOSIS — I4891 Unspecified atrial fibrillation: Secondary | ICD-10-CM | POA: Diagnosis not present

## 2015-10-13 LAB — I-STAT CHEM 8, ED
BUN: 23 mg/dL — AB (ref 6–20)
CALCIUM ION: 1.14 mmol/L (ref 1.12–1.23)
CHLORIDE: 101 mmol/L (ref 101–111)
CREATININE: 1.1 mg/dL — AB (ref 0.44–1.00)
GLUCOSE: 220 mg/dL — AB (ref 65–99)
HCT: 50 % — ABNORMAL HIGH (ref 36.0–46.0)
Hemoglobin: 17 g/dL — ABNORMAL HIGH (ref 12.0–15.0)
Potassium: 3.7 mmol/L (ref 3.5–5.1)
Sodium: 141 mmol/L (ref 135–145)
TCO2: 26 mmol/L (ref 0–100)

## 2015-10-13 LAB — CBG MONITORING, ED: GLUCOSE-CAPILLARY: 197 mg/dL — AB (ref 65–99)

## 2015-10-13 MED ORDER — EPINEPHRINE HCL 0.1 MG/ML IJ SOSY
PREFILLED_SYRINGE | INTRAMUSCULAR | Status: AC | PRN
  Administered 2015-10-13 (×4): 1 via INTRAVENOUS

## 2015-10-13 MED ORDER — NALOXONE HCL 2 MG/2ML IJ SOSY
PREFILLED_SYRINGE | INTRAMUSCULAR | Status: AC | PRN
  Administered 2015-10-13: 2 mg via INTRAVENOUS

## 2015-10-13 MED ORDER — METOPROLOL TARTRATE 1 MG/ML IV SOLN: INTRAVENOUS | Status: AC | PRN

## 2015-10-13 MED ORDER — SODIUM CHLORIDE 0.9 % IV SOLN
INTRAVENOUS | Status: AC | PRN
  Administered 2015-10-13: 1000 mL via INTRAVENOUS

## 2015-10-13 MED ORDER — CALCIUM CHLORIDE 10 % IV SOLN
INTRAVENOUS | Status: AC | PRN
  Administered 2015-10-13: 1 g via INTRAVENOUS

## 2015-10-13 MED ORDER — DEXTROSE 5 % IV SOLN
150.0000 mg | INTRAVENOUS | Status: AC | PRN
  Administered 2015-10-13: 150 mg via INTRAVENOUS

## 2015-10-13 MED ORDER — SODIUM BICARBONATE 8.4 % IV SOLN
INTRAVENOUS | Status: AC | PRN
  Administered 2015-10-13 (×2): 100 meq via INTRAVENOUS

## 2015-10-18 MED FILL — Medication: Qty: 1 | Status: AC

## 2015-10-19 ENCOUNTER — Other Ambulatory Visit (HOSPITAL_COMMUNITY): Payer: Self-pay

## 2015-10-31 ENCOUNTER — Encounter: Payer: Self-pay | Admitting: Internal Medicine

## 2015-11-01 NOTE — ED Provider Notes (Addendum)
CSN: [id number]     Arrival date & time 10/30/15  7829 History   First MD Initiated Contact with Patient 10-30-15 [phone]     Chief Complaint  Patient presents with  . Cardiac Arrest     (Consider location/radiation/quality/duration/timing/severity/associated sxs/prior Treatment) Patient is a 45 y.o. female presenting with shortness of breath. The history is provided by the EMS personnel. The history is limited by the condition of the patient (in arrest).  Shortness of Breath Severity:  Severe Onset quality:  Unable to specify Timing:  Constant Progression:  Worsening Chronicity:  New Context: not strong odors   Relieved by:  Nothing Worsened by:  Nothing tried Ineffective treatments: cpr intubation. Risk factors: obesity   Risk factors: no recent surgery   EMS called out for SOB, found patient in car responsive but speaking only in short phrases.  En route was decompensated per EMS and went into PEA arrest as EMS arrived at [facility].  Cyanotic from mid chest up.  EPI given in ambulance. Patient transferred to room 10 with continuous CPR.    Past Medical History  Diagnosis Date  . Hypertrophic cardiomyopathy (HCC)     s/p septal myomectomy with implantable defibrillator 01/27/2012   . Anxiety state, unspecified   . Allergic rhinitis     to dogs  . Asthma   . Anemia     during pregnancy  . History of chicken pox   . Generalized headaches   . Ocular migraine     no HA  . Back pain     told cracked bone, vicodin didn't help, improved with percocets/muscle relaxants, no eval by prior PCP  . 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 DVT (deep venous thrombosis) (HCC) 01/2012    was placed on Warfarin after cardiac surgery but had skin reaction to warfarin so was changed to Pradaxa so DVT occurred while on Pradaxa although unclear if it occurred during transition  . Hypotension   . Atrial fibrillation (HCC)     a. On pradaxa.   coumadin necrosis with warfarin;  b. Amio d/c'd 02/2012  . Pericardial effusion   . Chronic combined systolic and diastolic CHF (congestive heart failure) (HCC)     a. echo 03/19/12: severe asymmetric septal hypertrophy, evidence of upper septal myomectomy, peak LV outflow tract gradient 35, EF 35%,;  b.  Echo 9/13: Severe LVH, EF 30-35%, anteroseptal and inferior HK, LVOT narrow, mild AI, mild to moderate mitral stenosis, severe LAE  . Pericardial tamponade 03/14/2012  . 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 01/2012 s/p BSX ICD;  b. 06/2012 Multaq initiated due to recurrent syncope, VT/VF - had severe n/v with IV amio.  . Skin necrosis --COUMADIN   . Chronic chest wall pain     eval by CVTS, stable, rec return to [facility] if continued pain  . Syncope     a. in setting of VT/VF.  Marland Kitchen Hypothyroid   . Blood transfusion without reported diagnosis   . Neuromuscular disorder (HCC)   . Gall stones   . Atrial flutter (HCC)     a. 10/2013 with inappropriate ICD therapy; b. s/p CTI ablation by Dr Ladona Ridgel 11/16/2013;  c. 11/2013 syncope 2/2 aflutter/VT->failed DCCV->amio resumed w/ zofran for nausea.  Marland Kitchen PONV (postoperative nausea and vomiting)   . Depression   . Post-traumatic stress   . Fibromyalgia   . Hx of echocardiogram     Echo (  2/16): mod LVH, EF 55-60%, no RWMA, Gr 2 DD, mild AI, mild to mod MS, mod LAE  . Fatty liver disease, nonalcoholic 2016    by abd [place]   Past Surgical History  Procedure Laterality Date  . Cesarean section  1991  . Induced abortion  1990 and 1993  . Cardiac surgery  01/27/2012    median sternotomy, septal myectomy, MVR - [facility] (Dr. Silvestre Mesi)  . Cardiac defibrillator placement  01/2012    BSX dual chamber ICD  . Myomectomy    . Pericardial window  03/14/2012    Procedure: PERICARDIAL WINDOW;  Surgeon: Purcell Nails, MD;  [facility];  Service: Open Heart Surgery;  Laterality: N/A;  Subxyphoid Pericardial  Window   . Ablation  11-16-13    RFCA of atrial flutter by  Dr Ladona Ridgel   . Insert / replace / remove pacemaker    . Mitral valve repair    . Cholecystectomy N/A 01/26/2014    LAPAROSCOPIC CHOLECYSTECTOMY WITH INTRAOPERATIVE CHOLANGIOGRAM;  Surgeon: Wilmon Arms. Tsuei, MD  . Atrial flutter ablation N/A 11/16/2013    Procedure: ATRIAL FLUTTER ABLATION;  Surgeon: Marinus Maw, MD;  Location: [facility] CATH LAB;  Service: Cardiovascular;  Laterality: N/A;  . Cardioversion N/A 12/13/2014    Procedure: CARDIOVERSION;  Surgeon: Wendall Stade, MD;  Location: [facility] ENDOSCOPY;  Service: Cardiovascular;  Laterality: N/A;   Family History  Problem Relation Age of Onset  . Diabetes Father   . Heart disease Father     unspecified heart problem  . Thyroid disease Mother   . Anemia Mother     mediterranean anemia, ITP  . Heart disease Maternal Grandfather     CHF  . Hyperlipidemia Maternal Grandfather   . Diabetes Paternal Grandfather   . Cancer Paternal Grandfather   . Coronary artery disease Neg Hx   . Stroke Father   . Sudden death Neg Hx   . Heart attack Neg Hx    Social History  Substance Use Topics  . Smoking status: Former Smoker -- 1.00 packs/day for 3 years    Types: Cigarettes    Quit date: 09/30/1988  . Smokeless tobacco: Never Used     Comment: quitin 1990  . Alcohol Use: No     Comment: none since "months" as of 02/28/2013   OB History    No data available     Review of Systems  Unable to perform ROS: Patient unresponsive  Respiratory: Positive for shortness of breath.       Allergies  Phenergan; Warfarin and related; and Nitroglycerin  Home Medications   Prior to Admission medications   Medication Sig Start Date End Date Taking? Authorizing Provider  acetaminophen (TYLENOL) 500 MG tablet Take 500-1,000 mg by mouth 2 (two) times daily as needed for mild pain, moderate pain, fever or headache (pain).     Historical Provider, MD  albuterol (PROVENTIL HFA;VENTOLIN HFA) 108 (90 BASE) MCG/ACT inhaler 2 puffs TID daily PRN (Fill whichever her  insurance will cover-albuterol, Proair, Proventil, Ventolin) 07/13/15   Eustaquio Boyden, MD  ALPRAZolam Prudy Feeler) 1 MG tablet TAKE ONE-HALF TO ONE TABLET BY MOUTH TWICE DAILY AS NEEDED 08/15/15   Eustaquio Boyden, MD  amiodarone (PACERONE) 200 MG tablet 400mg  daily (two 200mg  tablets) on Monday, Tuesday, Wednesday and Thursday and Amiodarone 200mg  daily on Friday, Saturday and Sunday 08/21/15   Marinus Maw, MD  amoxicillin (AMOXIL) 500 MG capsule Take 500 mg by mouth 2 (two) times daily. DENTAL PROCEDURES ONLY  02/28/15   Historical Provider, MD  beclomethasone (QVAR) 80 MCG/ACT inhaler Inhale 1 puff into the lungs 2 (two) times daily.    Historical Provider, MD  benzonatate (TESSALON) 100 MG capsule Take 2 capsules (200 mg total) by mouth 2 (two) times daily as needed for cough. 10/10/15   Roxy Horseman, PA-C  cetirizine (ZYRTEC) 10 MG tablet Take 10 mg by mouth daily.     Historical Provider, MD  docusate sodium (COLACE) 100 MG capsule Take 100 mg by mouth 2 (two) times daily as needed for mild constipation.    Historical Provider, MD  ELIQUIS 5 MG TABS tablet TAKE ONE TABLET BY MOUTH TWICE DAILY 09/11/15   Tonny Bollman, MD  furosemide (LASIX) 40 MG tablet Pt may take 1 extra tablet daily as needed for swelling (Takes an extra potassium when she does this) 08/21/15   Marinus Maw, MD  gabapentin (NEURONTIN) 300 MG capsule Take 2 capsules (600 mg total) by mouth 3 (three) times daily. 07/14/15   Drema Dallas, DO  levonorgestrel (MIRENA) 20 MCG/24HR IUD 1 each by Intrauterine route once.    Historical Provider, MD  levothyroxine (SYNTHROID, LEVOTHROID) 150 MCG tablet Take 1 tablet (150 mcg total) by mouth daily before breakfast. 10/05/15   Beatrice Lecher, PA-C  Magnesium Gluconate 250 MG TABS Take 500 mg by mouth 2 (two) times daily.    Historical Provider, MD  metoprolol succinate (TOPROL-XL) 25 MG 24 hr tablet Take 3 tablets (75 mg total) by mouth 2 (two) times daily. 08/21/15   Tonny Bollman, MD   mexiletine (MEXITIL) 150 MG capsule Take 1 capsule (150 mg total) by mouth 3 (three) times daily. 06/14/15   Marinus Maw, MD  ofloxacin (FLOXIN) 0.3 % otic solution Place 5 drops into the right ear 2 (two) times daily. 09/17/15   Eyvonne Mechanic, PA-C  Olopatadine HCl (PATANASE) 0.6 % SOLN Place 1 puff into the nose daily as needed (for allergies).     Historical Provider, MD  omeprazole (PRILOSEC) 40 MG capsule Take 1 capsule (40 mg total) by mouth daily as needed. 08/21/15   Marinus Maw, MD  omeprazole (PRILOSEC) 40 MG capsule TAKE ONE CAPSULE BY MOUTH ONCE DAILY AS NEEDED 09/18/15   Eustaquio Boyden, MD  ondansetron (ZOFRAN) 4 MG tablet Take 4 mg by mouth 2 (two) times daily as needed. EVERY 12 HOURS AS NEEDED BY MOUTH FOR NAUSEA 07/05/15   Historical Provider, MD  oxyCODONE-acetaminophen (PERCOCET) 7.5-325 MG per tablet Take 1 tablet by mouth every 4 (four) hours as needed (pain).  10/13/14   Historical Provider, MD  Potassium Gluconate 595 MG CAPS Take 1 capsule (595 mg total) by mouth daily. OK to take extra on days you need extra lasix 11/03/14   Beatrice Lecher, PA-C  ranolazine (RANEXA) 1000 MG SR tablet Take 1 tablet (1,000 mg total) by mouth 2 (two) times daily. 04/17/15 04/16/16  Tonny Bollman, MD  sertraline (ZOLOFT) 100 MG tablet Take 1 tablet (100 mg total) by mouth daily. 08/21/15   Eustaquio Boyden, MD   SpO2 0% Physical Exam  HENT:  Head: Normocephalic and atraumatic.  Mouth/Throat: No oropharyngeal exudate.  Eyes: Conjunctivae are normal.  Neck: No tracheal deviation present. No thyromegaly present.  Cardiovascular:  No cardiac activity on bedside US by me  Pulmonary/Chest:  Rales with bagging  Abdominal: Bowel sounds are absent.  Musculoskeletal: She exhibits no edema.  Neurological: She is unresponsive. GCS eye subscore is 1. GCS verbal subscore is  1. GCS motor subscore is 1.  Skin: There is cyanosis.  Cool skin  Psychiatric:  unable    ED Course  Procedures  (including critical care time) Labs Review Labs Reviewed  I-STAT CHEM 8, ED - Abnormal; Notable for the following:    BUN 23 (*)    Creatinine, Ser 1.10 (*)    Glucose, Bld 220 (*)    Hemoglobin 17.0 (*)    HCT 50.0 (*)    All other components within normal limits    Imaging Review No results found. I have personally reviewed and evaluated these images and lab results as part of my medical decision-making.   EKG Interpretation None      MDM   Final diagnoses:  Cardiac arrest [facility](HCC)    [facility]  Department of Emergency Medicine   Code Blue CONSULT NOTE  Chief Complaint: Cardiac arrest/unresponsive   Level V Caveat: Unresponsive  History of present illness: I was contacted by the hospital for a CODE BLUE cardiac arrest upstairs and presented to the patient's bedside.    ROS: Unable to obtain, Level V caveat  Scheduled Meds:  Continuous Infusions: . amiodarone (NEXTERONE) IV bolus only 150 mg/100 mL     PRN Meds:.amiodarone (NEXTERONE) IV bolus only 150 mg/100 mL, calcium chloride, EPINEPHrine, metoprolol, naloxone, sodium bicarbonate Past Medical History  Diagnosis Date  . Hypertrophic cardiomyopathy (HCC)     s/p septal myomectomy with implantable defibrillator 01/27/2012   . Anxiety state, unspecified   . Allergic rhinitis     to dogs  . Asthma   . Anemia     during pregnancy  . History of chicken pox   . Generalized headaches   . Ocular migraine     no HA  . Back pain     told cracked bone, vicodin didn't help, improved with percocets/muscle relaxants, no eval by prior PCP  . 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 DVT (deep venous thrombosis) (HCC) 01/2012    was placed on Warfarin after cardiac surgery but had skin reaction to warfarin so was changed to Pradaxa so DVT occurred while on Pradaxa although unclear if it occurred during transition  . Hypotension   . Atrial  fibrillation (HCC)     a. On pradaxa.  coumadin necrosis with warfarin;  b. Amio d/c'd 02/2012  . Pericardial effusion   . Chronic combined systolic and diastolic CHF (congestive heart failure) (HCC)     a. echo 03/19/12: severe asymmetric septal hypertrophy, evidence of upper septal myomectomy, peak LV outflow tract gradient 35, EF 35%,;  b.  Echo 9/13: Severe LVH, EF 30-35%, anteroseptal and inferior HK, LVOT narrow, mild AI, mild to moderate mitral stenosis, severe LAE  . Pericardial tamponade 03/14/2012  . 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 01/2012 s/p BSX ICD;  b. 06/2012 Multaq initiated due to recurrent syncope, VT/VF - had severe n/v with IV amio.  . Skin necrosis --COUMADIN   . Chronic chest wall pain     eval by CVTS, stable, [facility]  . Syncope     a. in setting of VT/VF.  Marland Kitchen. Hypothyroid   . Blood transfusion without reported diagnosis   . Neuromuscular disorder (HCC)   . Gall stones   . Atrial flutter (HCC)     a. 10/2013 with inappropriate ICD therapy; b. s/p CTI ablation by Dr Ladona Ridgelaylor 11/16/2013;  c. 11/2013 syncope 2/2 aflutter/VT->failed DCCV->amio resumed w/ zofran for nausea.  Marland Kitchen PONV (postoperative nausea and vomiting)   . Depression   . Post-traumatic stress   . Fibromyalgia   . Hx of echocardiogram     Echo (2/16): mod LVH, EF 55-60%, no RWMA, Gr 2 DD, mild AI, mild to mod MS, mod LAE  . Fatty liver disease, nonalcoholic 2016    by abd [place]   Past Surgical History  Procedure Laterality Date  . Cesarean section  1991  . Induced abortion  1990 and 1993  . Cardiac surgery  01/27/2012    median sternotomy, septal myectomy, MVR - [facility] (Dr. Silvestre Mesi)  . Cardiac defibrillator placement  01/2012    BSX dual chamber ICD  . Myomectomy    . Pericardial window  03/14/2012    Procedure: PERICARDIAL WINDOW;  Surgeon: Purcell Nails, MD;  [facility] OR;  Service: Open Heart Surgery;  Laterality: N/A;  Subxyphoid Pericardial  Window   .  Ablation  11-16-13    RFCA of atrial flutter by Dr Ladona Ridgel   . Insert / replace / remove pacemaker    . Mitral valve repair    . Cholecystectomy N/A 01/26/2014    LAPAROSCOPIC CHOLECYSTECTOMY WITH INTRAOPERATIVE CHOLANGIOGRAM;  Surgeon: Wilmon Arms. Tsuei, MD  . Atrial flutter ablation N/A 11/16/2013    Procedure: ATRIAL FLUTTER ABLATION;  Surgeon: Marinus Maw, MD;  [facility] CATH LAB;  Service: Cardiovascular;  Laterality: N/A;  . Cardioversion N/A 12/13/2014    Procedure: CARDIOVERSION;  Surgeon: Wendall Stade, MD;  Location: [facility] ENDOSCOPY;  Service: Cardiovascular;  Laterality: N/A;   Social History   Social History  . Marital Status: Single    Spouse Name: N/A  . Number of Children: 1  . Years of Education: N/A   Occupational History  . Naval architect    Social History Main Topics  . Smoking status: Former Smoker -- 1.00 packs/day for 3 years    Types: Cigarettes    Quit date: 09/30/1988  . Smokeless tobacco: Never Used     Comment: quitin 1990  . Alcohol Use: No     Comment: none since "months" as of 02/28/2013  . Drug Use: No     Comment: quiti n 1989  . Sexual Activity: Yes    Birth Control/ Protection: Pill   Other Topics Concern  . Not on file   Social History Narrative   Caffeine: 1 cup coffee/day   Divorced, 1 child.  lives with boyfriend, dogs   Occupation: Full time Tax adviser   Activity: walks 37min/day   Diet: fruits/vegetables daily, water, no fish   Allergies  Allergen Reactions  . Phenergan [Promethazine Hcl] Itching  . Warfarin And Related Other (See Comments)    'burns my skin'  . Nitroglycerin Other (See Comments)    Hypotension    Last set of Vital Signs (not current) Filed Vitals:   2015-10-18 0605  BP: 0/0  Pulse: 0  Resp: 0      Physical Exam Gen: unresponsive Cardiovascular: pulseless  Resp: apneic. Breath sounds equal bilaterally with bagging  Abd: nondistended  Neuro: GCS 3, unresponsive to pain  HEENT:  No blood in posterior pharynx, gag reflex absent  Neck: No crepitus  Musculoskeletal: No deformity  Skin: warm  See paper chart for medications   Cardiopulmonary Resuscitation (CPR) Procedure Note Directed/Performed by: Jasmine Awe I personally directed ancillary staff and/or performed CPR in an effort to regain return of  spontaneous circulation and to maintain cardiac, neuro and systemic perfusion.    Case d/w Dr. Posey Rea, Dr. Sharen Hones to certify the death certificate      Hailie Searight, MD 10-22-15 1610  Adlynn Lowenstein, MD 2015-10-22 0700

## 2015-11-01 NOTE — Telephone Encounter (Signed)
PLEASE NOTE: All timestamps contained within this report are represented as [place] Standard Time. CONFIDENTIALTY NOTICE: This fax transmission is intended only for the addressee. It contains information that is legally privileged, confidential or otherwise protected from use or disclosure. If you are not the intended recipient, you are strictly prohibited from reviewing, disclosing, copying using or disseminating any of this information or taking any action in reliance on or regarding this information. If you have received this fax in error, please notify us immediately by telephone so that we can arrange for its return to us. Phone: [phone], Toll-Free: [phone], Fax: [phone] Page: 1 of 1 Call Id: [id number] [facility] - Client TELEPHONE ADVICE RECORD [facility] Patient Name: Naoma DienerCINDY Hellickson Gender: Female DOB: 10/06/1990 Age: [age] Y 6 D Return Phone Number: Address: City/State/Zip: [place] Client [facility] [place] Client [facility] - Day Physician Eustaquio BoydenGutierrez, Javier Contact Type Call Call Type Triage / Clinical Caller Name Marylene LandAngela Relationship To Patient Provider Appointment Disposition EMR Appointment Not Necessary Info pasted into Epic No Return Phone Number Please choose phone number Chief Complaint Paging or Request for Consult Initial Comment Caller states [facility]ngela [facility] at [phone] and needs oncall paged Nurse Assessment Nurse: Daisy BlossomYazel, Lavada Date/Time Lamount Cohen(Eastern Time): 10/28/15 6:19:09 AM Confirm and document reason for call. If symptomatic, describe symptoms. ---Caller states Marylene Landngela from [facility] at [phone] needs on-call paged. Has the patient traveled out of the country within the last 30 days? ---Not Applicable Does the patient have any new or worsening symptoms? ---No Guidelines Guideline Title Affirmed Question Affirmed Notes  Nurse Date/Time (Eastern Time) Disp. Time Lamount Cohen(Eastern Time) Disposition Final User 10/28/15 6:48:58 AM Send To RN Personal Daisy BlossomYazel, Lavada 10/28/15 7:30:17 AM Call Completed Daisy BlossomYazel, Lavada 10/28/15 6:27:19 AM Clinical Call Yes Daisy BlossomYazel, Lavada After Care Instructions Given Call Event Type User Date / Time Description Comments User: Daisy BlossomLavada, Yazel Date/Time Lamount Cohen(Eastern Time): 10/28/15 6:26:21 AM [facility]  advised that she has already talked with on-call Dr. Sonda PrimesAlex Plotnikov.

## 2015-11-01 NOTE — Code Documentation (Signed)
EDP at bedside with US.  

## 2015-11-01 NOTE — Telephone Encounter (Signed)
PLEASE NOTE: All timestamps contained within this report are represented as [place] Standard Time. CONFIDENTIALTY NOTICE: This fax transmission is intended only for the addressee. It contains information that is legally privileged, confidential or otherwise protected from use or disclosure. If you are not the intended recipient, you are strictly prohibited from reviewing, disclosing, copying using or disseminating any of this information or taking any action in reliance on or regarding this information. If you have received this fax in error, please notify us immediately by telephone so that we can arrange for its return to us. Phone: [phone], Toll-Free: [phone], Fax: [phone] Page: 1 of 1 Call Id: [id number] [facility] - Client TELEPHONE ADVICE RECORD [facility] Patient Name: Naoma DienerCINDY Fleeman Gender: Female DOB: 09/07/1971 Age: [age] Y 6 D Return Phone Number: Address: City/State/Zip: [place] StatisticianClient [facility] - Client [facility] - Night Physician Eustaquio BoydenGutierrez, Javier Contact Type Call Call Type Page Only Caller Name Dr. Nicanor AlconPalumbo Is this call to report lab results? No Return Phone Number Please choose phone number Initial Comment Caller States [facility] needs [facility] speak with the on call, the patient went into cardiac arrest and expired CB# [phone] Nurse Assessment Guidelines Guideline Title Affirmed Question Affirmed Notes Nurse Date/Time Lamount Cohen(Eastern Time) Disp. Time Lamount Cohen(Eastern Time) Disposition Final User 10-08-15 6:10:03 AM [facility] to [facility] Paging Queue Bradley Ferrisaris, Julia 10-08-15 6:10:13 AM [facility] Paging Queue Bradley Ferrisaris, Julia 10-08-15 6:15:18 AM Called On-Call Provider Dennison BullaMonroe, Jason 10-08-15 6:15:33 AM Page Completed Yes Dennison BullaMonroe, Jason After Care Instructions Given Call Event Type User Date / Time Description Paging [facility] Phone DateTime Result/Outcome Message Type  Notes Sonda Primeslotnikov, Alex [phone] 10-08-15 6:15:18 AM Called On Call Provider - Reached Doctor Paged Sonda Primeslotnikov, Alex 10-08-15 6:15:24 AM Spoke with On Call - General Message Result

## 2015-11-01 NOTE — ED Notes (Signed)
Pt in EMS from home C/O resp distress, upon arrival pt ED pt found to be pulseless in EMS truck, CPR was initiated by EMS and pt was brought into room and CPR was continued by staff

## 2015-11-01 NOTE — ED Notes (Signed)
On phone with [facility]

## 2015-11-01 NOTE — ED Notes (Signed)
Dr. Sharen HonesGutierrez paged [phone] @ [phone].

## 2015-11-01 NOTE — ED Notes (Signed)
[facility] referral [id number] [name]

## 2015-11-01 NOTE — Progress Notes (Signed)
RT assisted with intubation. ETCO2 positive color change. Bilateral breath sounds present. Patient never made it on to ventilator. Patient came in CPR. Patient expired before Ventilator could be started.

## 2015-11-01 NOTE — ED Provider Notes (Signed)
I was called emergently to the ambulance bay. Patient brought in by EMS. Coated while in the ambulance bay. Respiratory distress prior to coating. Extensive medical history. Initial rhythm appeared to be PDA. Currently status post 1 mg of epinephrine. Unable to obtain airway in the ambulance. CPR continued.CPR taken over by Dr. Nicanor AlconPalumbo.  Patient was intubated by myself. I assisted with airway management and CPR but primary 's edition Dr. Nicanor AlconPalumbo. See her notes for specific details.  INTUBATION Performed by: Shon BatonHORTON, Yug Loria F  Required items: required blood products, implants, devices, and special equipment available Patient identity confirmed: provided demographic data and hospital-assigned identification number Time out: Immediately prior to procedure a "time out" was called to verify the correct patient, procedure, equipment, support staff and site/side marked as required.  Indications: arrest  Intubation method: Glidescope Laryngoscopy   Preoxygenation: BVM  Sedatives: na Paralytic: na  Tube Size: 7.5 cuffed  Post-procedure assessment: chest rise and ETCO2 monitor Breath sounds: equal and absent over the epigastrium Tube secured with: ETT holder Chest x-ray interpreted by radiologist and me.  Chest x-ray findings: endotracheal tube in appropriate position  Patient tolerated the procedure well with no immediate complications.  Cardiopulmonary Resuscitation (CPR) Procedure Note Directed/Performed by: Shon BatonHORTON, Vernis Cabacungan F I personally directed ancillary staff and/or performed CPR in an effort to regain return of spontaneous circulation and to maintain cardiac, neuro and systemic perfusion.    Shon Batonourtney F Cyntha Brickman, MD 06/12/2016 [phone]

## 2015-11-01 DEATH — deceased

## 2015-12-06 IMAGING — CR DG CHEST 2V
2 series · 2 of 2 positions shown · non-contrast
Comparison: 12/12/2014

CLINICAL DATA: Shortness of breath.  Lower extremity swelling

EXAM:
CHEST  2 VIEW

[chest pa]
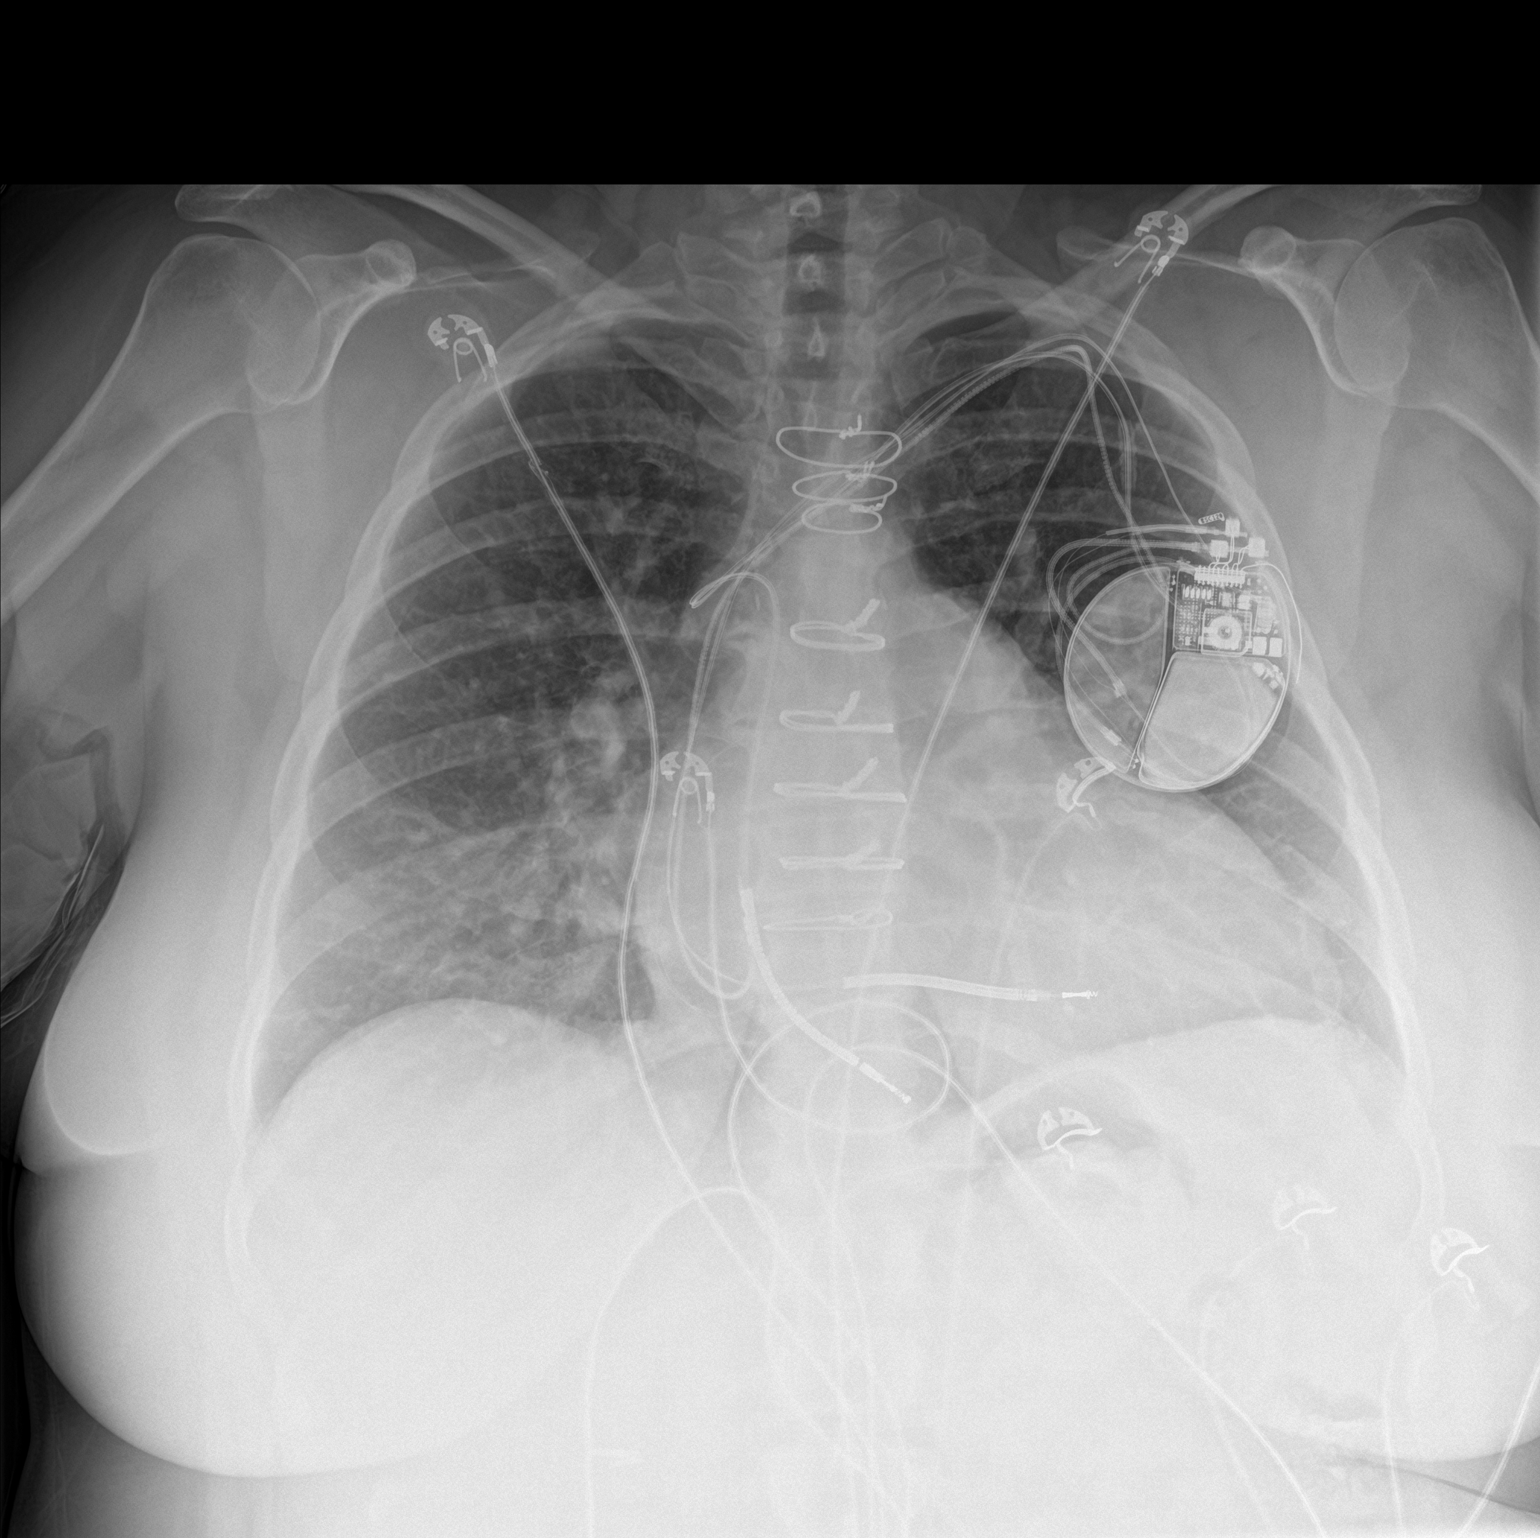

[chest lat]
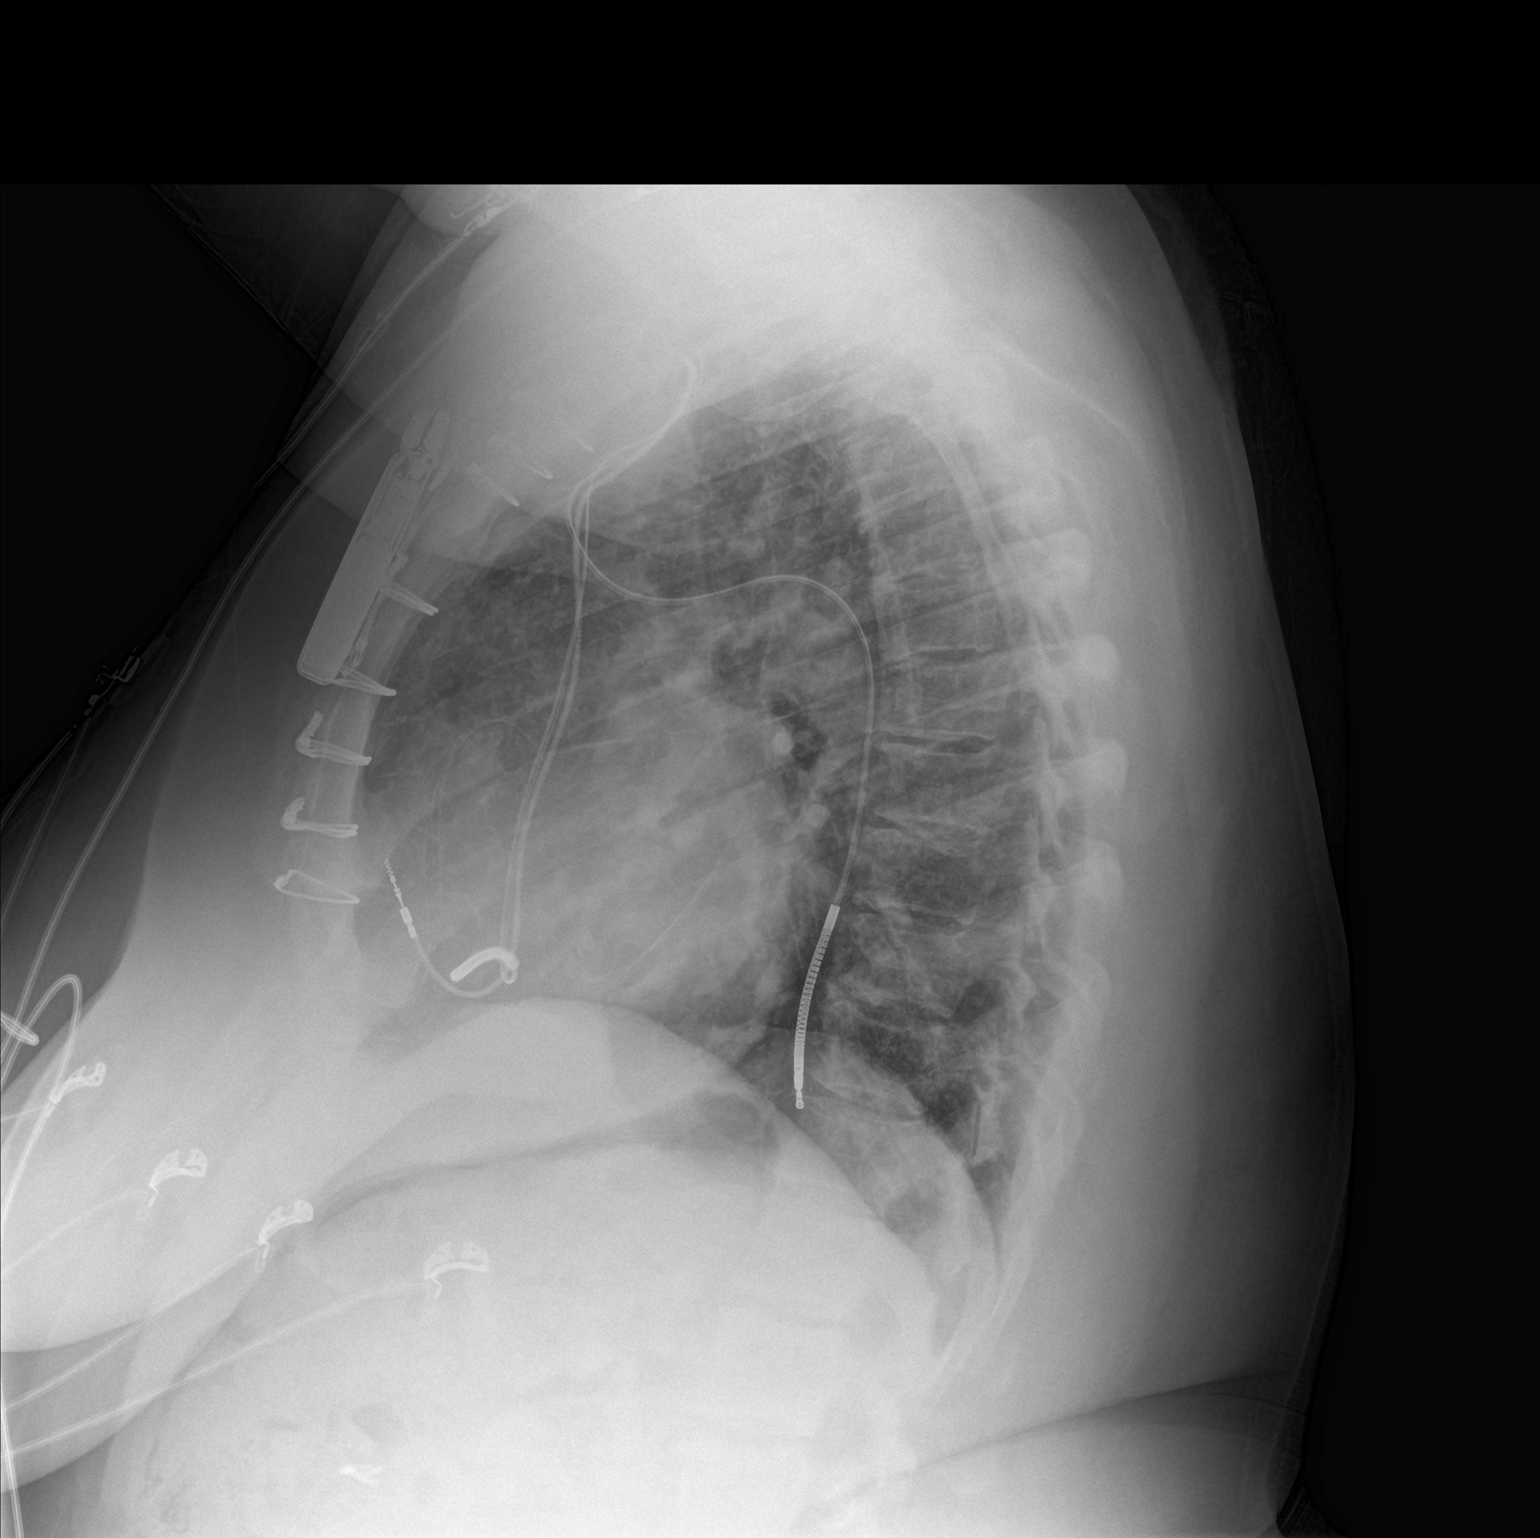

[2 of 2 positions shown; findings below may reference images not displayed]

FINDINGS: Chronic cardiopericardial enlargement. ICD/pacer leads from the left
are in stable position, including azygos system lead.

Chronic pulmonary venous congestion without edema or effusion. No
pneumonia.

Previous median sternotomy, reportedly for mitral valve repair.
IMPRESSION: No acute finding.

## 2016-01-13 IMAGING — US US ABDOMEN COMPLETE
1 series · 13 of 25 positions shown · non-contrast
Comparison: Abdominal ultrasound August 16, 2013

CLINICAL DATA: Transaminitis, nausea, no abdominal pain. History of
previous cholecystectomy.

EXAM:
ULTRASOUND ABDOMEN COMPLETE

[Series 1: us abdomen complete · 0.24mm/px · 13 of 61 slices shown]
[im 1/61]
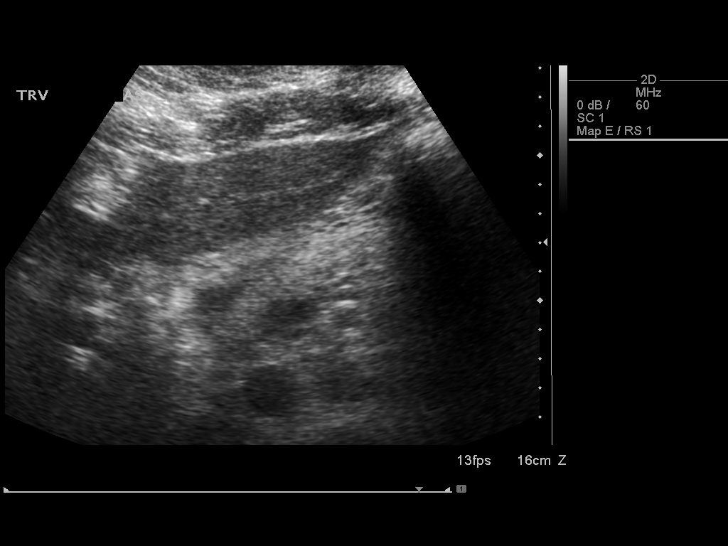
[im 6/61]
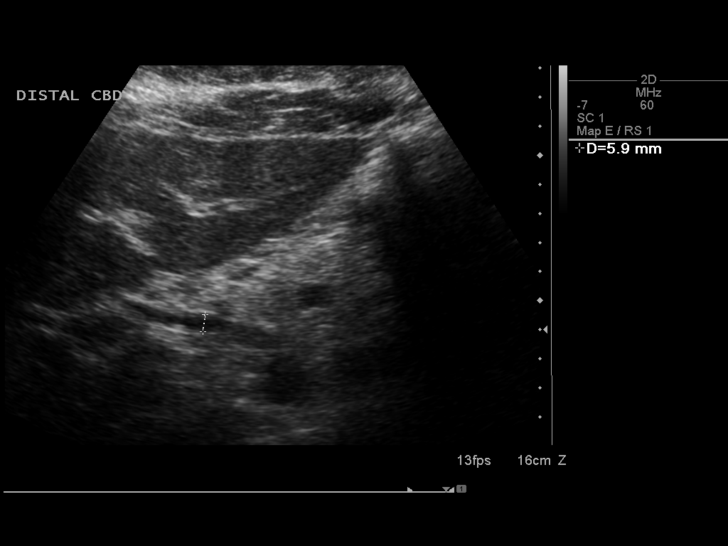
[im 11/61]
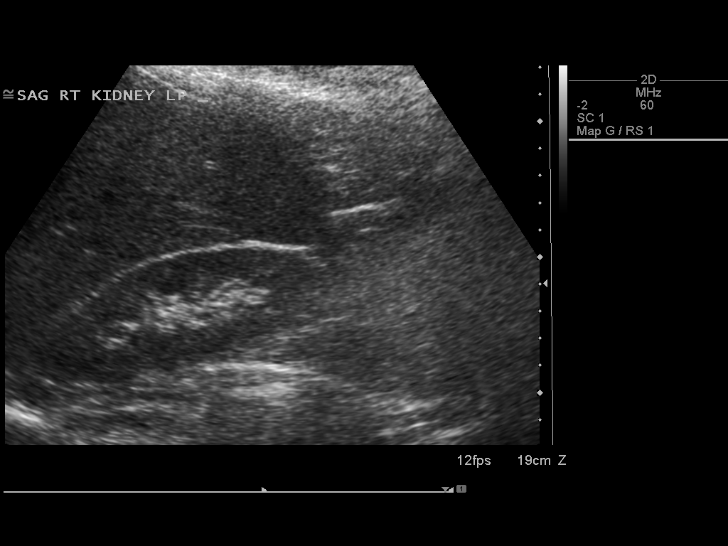
[im 16/61]
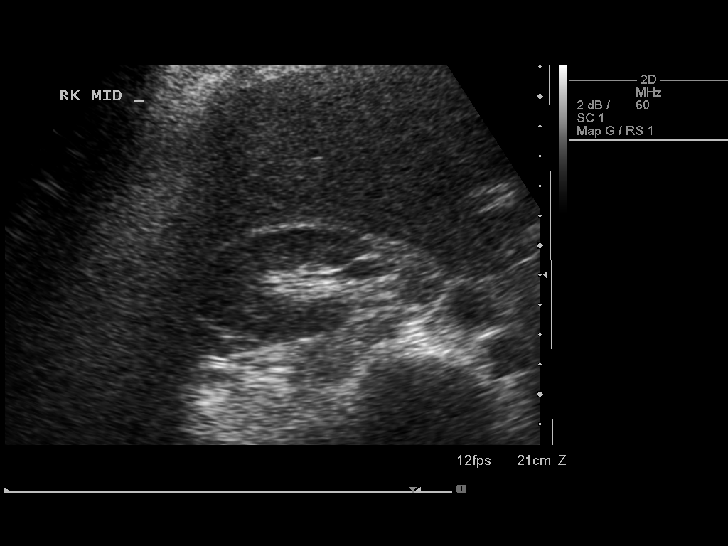
[im 21/61]
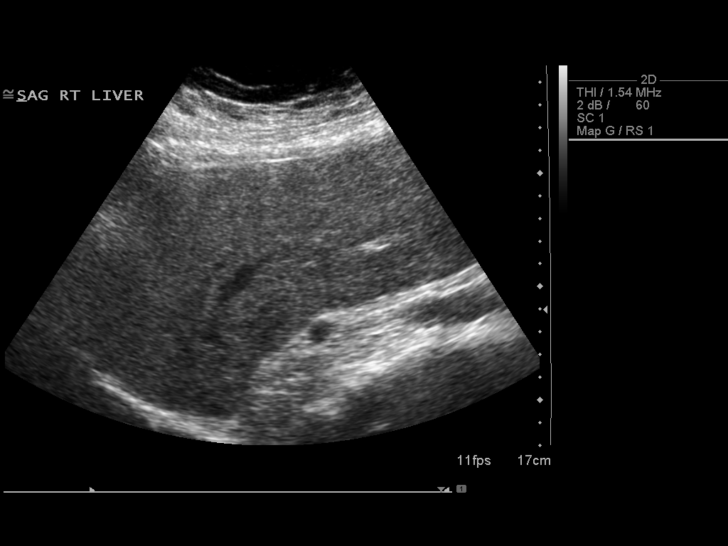
[im 26/61]
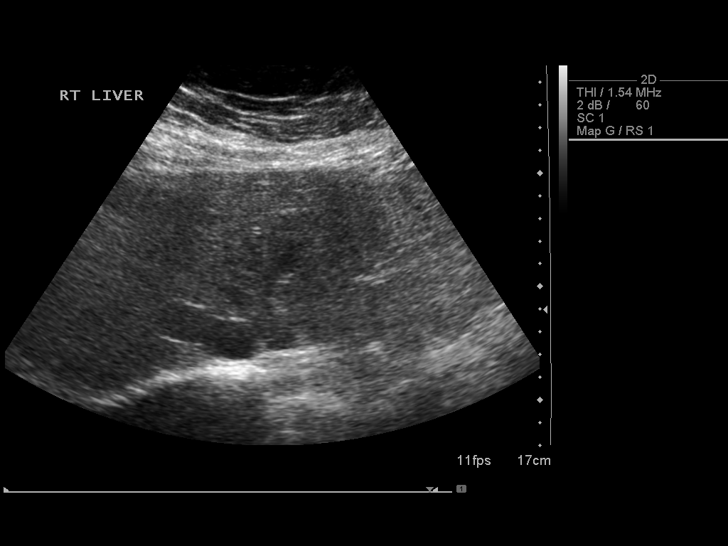
[im 31/61]
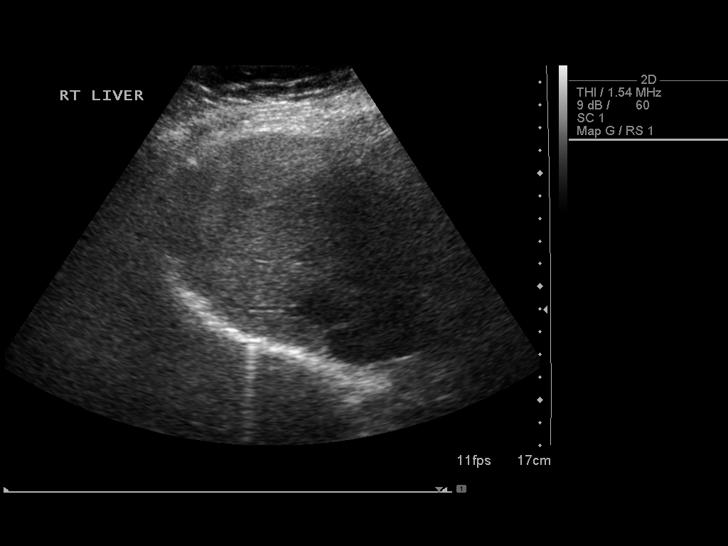
[im 36/61]
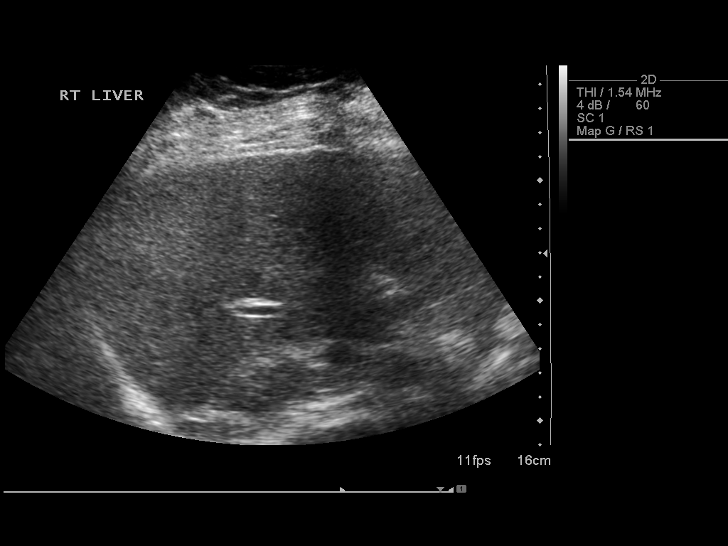
[im 41/61]
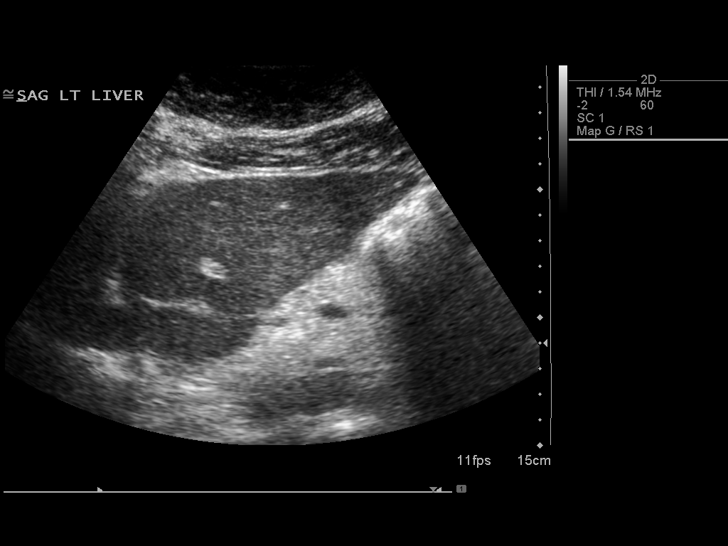
[im 46/61]
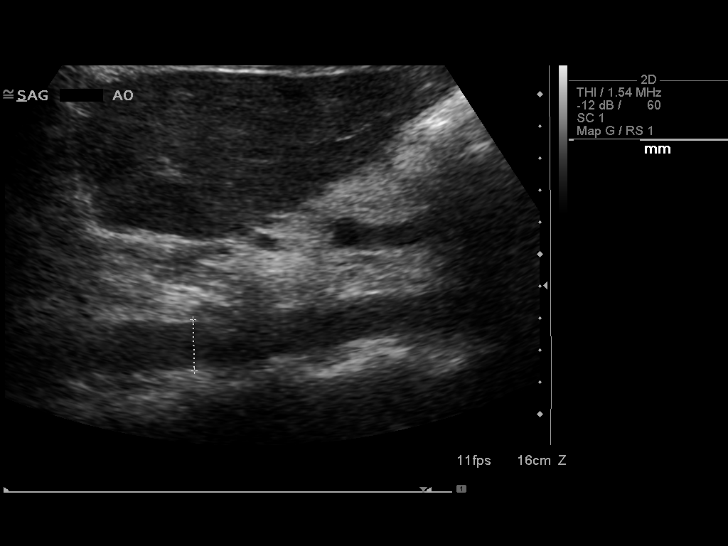
[im 51/61]
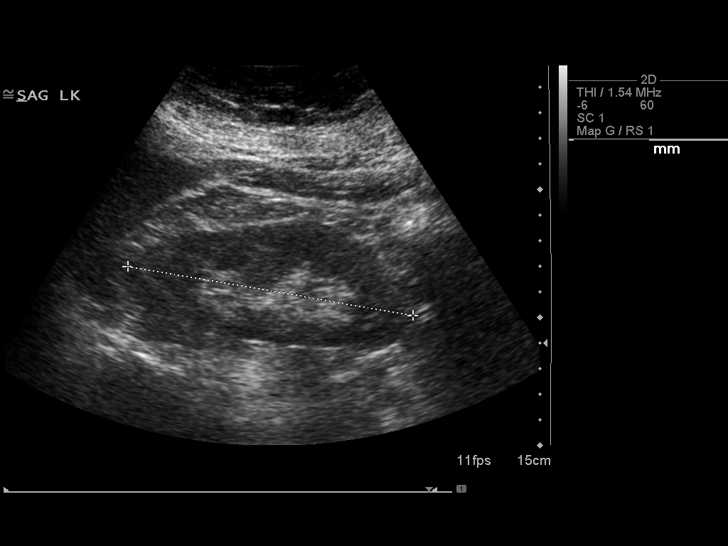
[im 56/61]
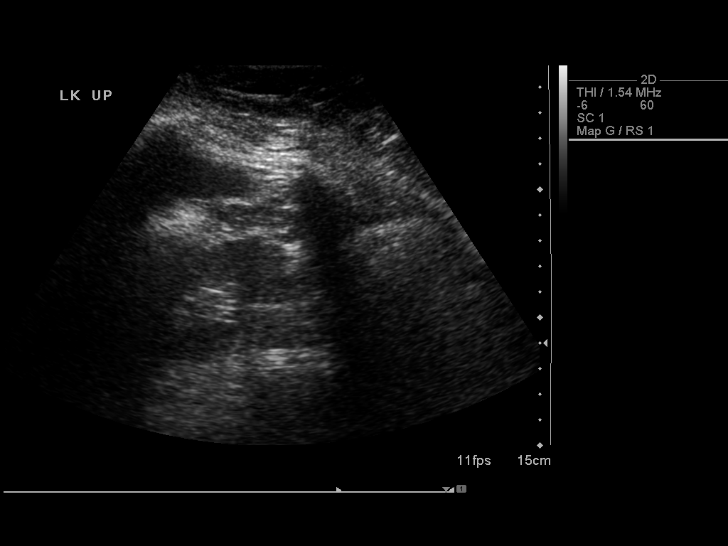
[im 61/61]
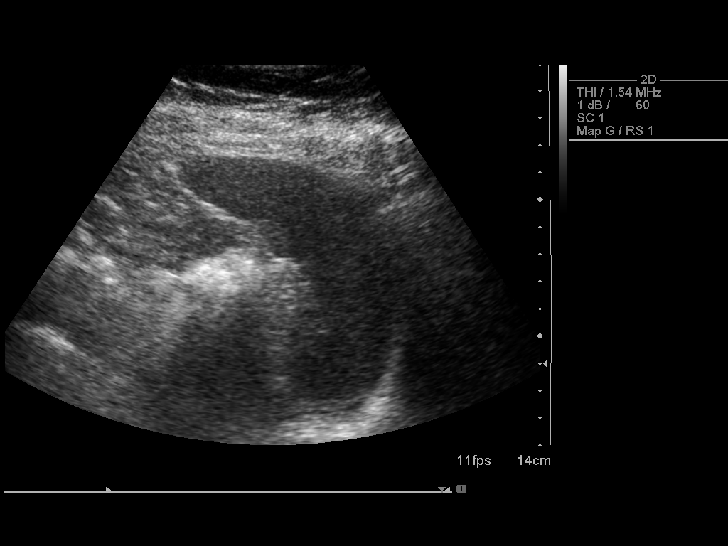

[13 of 25 positions shown; findings below may reference images not displayed]

FINDINGS: Gallbladder: The gallbladder is surgically absent.

Common bile duct: Diameter: 3.7- 5.9 mm. No intraluminal echoes are
observed.

Liver: The hepatic echotexture is somewhat heterogeneous. There is
no focal mass nor ductal dilation. The surface contour of the liver
is normal.

IVC: No abnormality visualized.

Pancreas: The pancreatic body is normal. Portions of the pancreatic
head and tail are obscured by bowel gas.

Spleen: Size and appearance within normal limits.

Right Kidney: Length: 10.7 cm. Echogenicity within normal limits. No
mass or hydronephrosis visualized.

Left Kidney: Length: 11.9 cm. Echogenicity within normal limits. No
mass or hydronephrosis visualized.

Abdominal aorta: Bowel gas observed the distal abdominal aorta and
iliac arteries. The visualized portions of the aorta measure up to
1.9 cm in diameter.

Other findings: There is no ascites.
IMPRESSION: 1. Coarse hepatic echotexture may reflect fatty infiltrative change.
No discrete mass or ductal dilation is observed. The gallbladder is
surgically absent. The common bile duct normal. If the patient's
elevated transaminases persist and remain unexplained, hepatic
protocol MRI may be useful.
2. Visualization of the pancreas and abdominal aorta is limited. The
kidneys and IVC and spleen are unremarkable.
